# Patient Record
Sex: Male | Born: 1950 | Race: White | Hispanic: No | Marital: Single | Smoking: Never smoker
Health system: Southern US, Community
[De-identification: ages and names within clinical notes are randomized; demographics above are authoritative.]

## PROBLEM LIST (undated history)

## (undated) DIAGNOSIS — F419 Anxiety disorder, unspecified: Secondary | ICD-10-CM

## (undated) DIAGNOSIS — I10 Essential (primary) hypertension: Secondary | ICD-10-CM

## (undated) DIAGNOSIS — F32A Depression, unspecified: Secondary | ICD-10-CM

## (undated) DIAGNOSIS — E7800 Pure hypercholesterolemia, unspecified: Secondary | ICD-10-CM

## (undated) DIAGNOSIS — M869 Osteomyelitis, unspecified: Secondary | ICD-10-CM

## (undated) DIAGNOSIS — K219 Gastro-esophageal reflux disease without esophagitis: Secondary | ICD-10-CM

## (undated) DIAGNOSIS — K760 Fatty (change of) liver, not elsewhere classified: Secondary | ICD-10-CM

## (undated) DIAGNOSIS — N289 Disorder of kidney and ureter, unspecified: Secondary | ICD-10-CM

## (undated) DIAGNOSIS — R479 Unspecified speech disturbances: Secondary | ICD-10-CM

## (undated) DIAGNOSIS — R110 Nausea: Secondary | ICD-10-CM

## (undated) DIAGNOSIS — K635 Polyp of colon: Secondary | ICD-10-CM

## (undated) DIAGNOSIS — D696 Thrombocytopenia, unspecified: Secondary | ICD-10-CM

## (undated) DIAGNOSIS — E119 Type 2 diabetes mellitus without complications: Secondary | ICD-10-CM

## (undated) DIAGNOSIS — D649 Anemia, unspecified: Secondary | ICD-10-CM

## (undated) DIAGNOSIS — M109 Gout, unspecified: Secondary | ICD-10-CM

## (undated) DIAGNOSIS — M1990 Unspecified osteoarthritis, unspecified site: Secondary | ICD-10-CM

## (undated) DIAGNOSIS — Z0189 Encounter for other specified special examinations: Secondary | ICD-10-CM

## (undated) DIAGNOSIS — Z9289 Personal history of other medical treatment: Secondary | ICD-10-CM

## (undated) DIAGNOSIS — F329 Major depressive disorder, single episode, unspecified: Secondary | ICD-10-CM

## (undated) HISTORY — DX: Pure hypercholesterolemia, unspecified: E78.00

## (undated) HISTORY — DX: Encounter for other specified special examinations: Z01.89

## (undated) HISTORY — DX: Nausea: R11.0

## (undated) HISTORY — PX: CHOLECYSTECTOMY: SHX55

## (undated) HISTORY — PX: HERNIA REPAIR: SHX51

## (undated) HISTORY — DX: Gout, unspecified: M10.9

## (undated) HISTORY — PX: HIP SURGERY: SHX245

## (undated) HISTORY — DX: Personal history of other medical treatment: Z92.89

## (undated) HISTORY — PX: NASAL POLYP SURGERY: SHX186

---
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
## 2000-11-14 ENCOUNTER — Ambulatory Visit (HOSPITAL_COMMUNITY): Admission: RE | Admit: 2000-11-14 | Discharge: 2000-11-14 | Payer: Self-pay | Admitting: Internal Medicine

## 2000-11-14 ENCOUNTER — Encounter: Payer: Self-pay | Admitting: Internal Medicine

## 2003-01-20 ENCOUNTER — Emergency Department (HOSPITAL_COMMUNITY): Admission: EM | Admit: 2003-01-20 | Discharge: 2003-01-20 | Payer: Self-pay | Admitting: *Deleted

## 2003-01-20 ENCOUNTER — Ambulatory Visit (HOSPITAL_COMMUNITY): Admission: RE | Admit: 2003-01-20 | Discharge: 2003-01-20 | Payer: Self-pay | Admitting: Internal Medicine

## 2003-01-21 ENCOUNTER — Ambulatory Visit (HOSPITAL_COMMUNITY): Admission: RE | Admit: 2003-01-21 | Discharge: 2003-01-21 | Payer: Self-pay | Admitting: Internal Medicine

## 2003-12-17 ENCOUNTER — Ambulatory Visit (HOSPITAL_COMMUNITY): Admission: RE | Admit: 2003-12-17 | Discharge: 2003-12-17 | Payer: Self-pay | Admitting: Internal Medicine

## 2004-03-22 ENCOUNTER — Ambulatory Visit: Payer: Self-pay | Admitting: Internal Medicine

## 2004-03-22 ENCOUNTER — Ambulatory Visit (HOSPITAL_COMMUNITY): Admission: RE | Admit: 2004-03-22 | Discharge: 2004-03-22 | Payer: Self-pay | Admitting: Internal Medicine

## 2004-03-25 ENCOUNTER — Emergency Department (HOSPITAL_COMMUNITY): Admission: EM | Admit: 2004-03-25 | Discharge: 2004-03-25 | Payer: Self-pay | Admitting: Emergency Medicine

## 2004-05-24 ENCOUNTER — Ambulatory Visit (HOSPITAL_COMMUNITY): Admission: RE | Admit: 2004-05-24 | Discharge: 2004-05-24 | Payer: Self-pay | Admitting: Family Medicine

## 2004-05-25 ENCOUNTER — Ambulatory Visit: Payer: Self-pay | Admitting: Internal Medicine

## 2004-05-27 ENCOUNTER — Ambulatory Visit: Payer: Self-pay | Admitting: Internal Medicine

## 2004-05-27 ENCOUNTER — Ambulatory Visit (HOSPITAL_COMMUNITY): Admission: RE | Admit: 2004-05-27 | Discharge: 2004-05-27 | Payer: Self-pay | Admitting: Internal Medicine

## 2004-06-02 ENCOUNTER — Ambulatory Visit (HOSPITAL_COMMUNITY): Admission: RE | Admit: 2004-06-02 | Discharge: 2004-06-02 | Payer: Self-pay | Admitting: General Surgery

## 2004-06-15 ENCOUNTER — Ambulatory Visit (HOSPITAL_COMMUNITY): Admission: RE | Admit: 2004-06-15 | Discharge: 2004-06-15 | Payer: Self-pay | Admitting: Internal Medicine

## 2004-07-09 ENCOUNTER — Observation Stay (HOSPITAL_COMMUNITY): Admission: RE | Admit: 2004-07-09 | Discharge: 2004-07-10 | Payer: Self-pay | Admitting: Pediatrics

## 2004-09-15 ENCOUNTER — Ambulatory Visit (HOSPITAL_COMMUNITY): Admission: RE | Admit: 2004-09-15 | Discharge: 2004-09-15 | Payer: Self-pay | Admitting: Family Medicine

## 2004-11-08 ENCOUNTER — Encounter: Admission: RE | Admit: 2004-11-08 | Discharge: 2004-11-08 | Payer: Self-pay | Admitting: *Deleted

## 2004-11-18 ENCOUNTER — Ambulatory Visit: Payer: Self-pay | Admitting: Cardiology

## 2004-11-22 ENCOUNTER — Encounter: Admission: RE | Admit: 2004-11-22 | Discharge: 2004-11-22 | Payer: Self-pay | Admitting: Orthopaedic Surgery

## 2005-01-13 ENCOUNTER — Ambulatory Visit: Payer: Self-pay | Admitting: Internal Medicine

## 2005-05-03 ENCOUNTER — Ambulatory Visit: Payer: Self-pay | Admitting: Internal Medicine

## 2005-05-09 ENCOUNTER — Ambulatory Visit (HOSPITAL_COMMUNITY): Admission: RE | Admit: 2005-05-09 | Discharge: 2005-05-09 | Payer: Self-pay | Admitting: Internal Medicine

## 2005-05-18 ENCOUNTER — Ambulatory Visit (HOSPITAL_COMMUNITY): Admission: RE | Admit: 2005-05-18 | Discharge: 2005-05-18 | Payer: Self-pay | Admitting: Family Medicine

## 2005-06-02 ENCOUNTER — Ambulatory Visit: Payer: Self-pay | Admitting: Internal Medicine

## 2005-06-07 ENCOUNTER — Ambulatory Visit (HOSPITAL_COMMUNITY): Admission: RE | Admit: 2005-06-07 | Discharge: 2005-06-07 | Payer: Self-pay | Admitting: Internal Medicine

## 2005-11-01 ENCOUNTER — Ambulatory Visit: Payer: Self-pay | Admitting: Internal Medicine

## 2005-11-14 HISTORY — PX: ESOPHAGOGASTRODUODENOSCOPY: SHX1529

## 2005-11-18 ENCOUNTER — Ambulatory Visit (HOSPITAL_COMMUNITY): Admission: RE | Admit: 2005-11-18 | Discharge: 2005-11-18 | Payer: Self-pay | Admitting: Internal Medicine

## 2005-11-18 ENCOUNTER — Ambulatory Visit: Payer: Self-pay | Admitting: Internal Medicine

## 2007-01-22 ENCOUNTER — Ambulatory Visit (HOSPITAL_COMMUNITY): Admission: RE | Admit: 2007-01-22 | Discharge: 2007-01-22 | Payer: Self-pay | Admitting: General Surgery

## 2007-02-12 ENCOUNTER — Observation Stay (HOSPITAL_COMMUNITY): Admission: RE | Admit: 2007-02-12 | Discharge: 2007-02-13 | Payer: Self-pay | Admitting: General Surgery

## 2007-02-12 ENCOUNTER — Encounter (INDEPENDENT_AMBULATORY_CARE_PROVIDER_SITE_OTHER): Payer: Self-pay | Admitting: General Surgery

## 2007-04-23 ENCOUNTER — Ambulatory Visit (HOSPITAL_COMMUNITY): Admission: RE | Admit: 2007-04-23 | Discharge: 2007-04-23 | Payer: Self-pay | Admitting: Internal Medicine

## 2007-06-04 ENCOUNTER — Ambulatory Visit: Payer: Self-pay | Admitting: Gastroenterology

## 2007-06-14 ENCOUNTER — Encounter: Payer: Self-pay | Admitting: Internal Medicine

## 2007-06-14 ENCOUNTER — Ambulatory Visit: Payer: Self-pay | Admitting: Internal Medicine

## 2007-06-14 ENCOUNTER — Ambulatory Visit (HOSPITAL_COMMUNITY): Admission: RE | Admit: 2007-06-14 | Discharge: 2007-06-14 | Payer: Self-pay | Admitting: Internal Medicine

## 2007-11-23 ENCOUNTER — Ambulatory Visit (HOSPITAL_COMMUNITY): Admission: RE | Admit: 2007-11-23 | Discharge: 2007-11-23 | Payer: Self-pay | Admitting: Family Medicine

## 2007-12-14 ENCOUNTER — Ambulatory Visit (HOSPITAL_COMMUNITY): Admission: RE | Admit: 2007-12-14 | Discharge: 2007-12-14 | Payer: Self-pay | Admitting: Urology

## 2008-02-21 ENCOUNTER — Ambulatory Visit (HOSPITAL_COMMUNITY): Admission: RE | Admit: 2008-02-21 | Discharge: 2008-02-21 | Payer: Self-pay | Admitting: Family Medicine

## 2008-03-05 ENCOUNTER — Ambulatory Visit (HOSPITAL_COMMUNITY): Admission: RE | Admit: 2008-03-05 | Discharge: 2008-03-06 | Payer: Self-pay | Admitting: General Surgery

## 2008-03-05 ENCOUNTER — Encounter (INDEPENDENT_AMBULATORY_CARE_PROVIDER_SITE_OTHER): Payer: Self-pay | Admitting: General Surgery

## 2008-11-26 ENCOUNTER — Ambulatory Visit (HOSPITAL_COMMUNITY): Admission: RE | Admit: 2008-11-26 | Discharge: 2008-11-26 | Payer: Self-pay | Admitting: Internal Medicine

## 2009-11-30 DIAGNOSIS — N390 Urinary tract infection, site not specified: Secondary | ICD-10-CM | POA: Insufficient documentation

## 2009-11-30 DIAGNOSIS — N183 Chronic kidney disease, stage 3 unspecified: Secondary | ICD-10-CM | POA: Insufficient documentation

## 2009-12-10 ENCOUNTER — Ambulatory Visit: Payer: Self-pay | Admitting: Otolaryngology

## 2009-12-15 ENCOUNTER — Ambulatory Visit: Payer: Self-pay | Admitting: Internal Medicine

## 2010-01-21 ENCOUNTER — Ambulatory Visit: Payer: Self-pay | Admitting: Otolaryngology

## 2010-01-29 ENCOUNTER — Ambulatory Visit (HOSPITAL_COMMUNITY)
Admission: RE | Admit: 2010-01-29 | Discharge: 2010-01-29 | Payer: Self-pay | Source: Home / Self Care | Attending: Otolaryngology | Admitting: Otolaryngology

## 2010-02-02 ENCOUNTER — Ambulatory Visit: Payer: Self-pay | Admitting: Internal Medicine

## 2010-02-16 ENCOUNTER — Ambulatory Visit: Admit: 2010-02-16 | Payer: Self-pay | Admitting: Internal Medicine

## 2010-03-04 ENCOUNTER — Ambulatory Visit
Admission: RE | Admit: 2010-03-04 | Discharge: 2010-03-04 | Payer: Self-pay | Source: Home / Self Care | Attending: Otolaryngology | Admitting: Otolaryngology

## 2010-03-07 ENCOUNTER — Encounter: Payer: Self-pay | Admitting: Family Medicine

## 2010-03-11 LAB — BASIC METABOLIC PANEL
BUN: 21 mg/dL (ref 6–23)
Calcium: 9 mg/dL (ref 8.4–10.5)
Chloride: 102 mEq/L (ref 96–112)
GFR calc non Af Amer: 42 mL/min — ABNORMAL LOW (ref >60)
Potassium: 4 mEq/L (ref 3.5–5.1)
Sodium: 138 mEq/L (ref 135–145)

## 2010-03-11 LAB — HEMOGLOBIN AND HEMATOCRIT, BLOOD: Hemoglobin: 14.7 g/dL (ref 13.0–17.0)

## 2010-03-11 LAB — SURGICAL PCR SCREEN

## 2010-03-18 ENCOUNTER — Other Ambulatory Visit (INDEPENDENT_AMBULATORY_CARE_PROVIDER_SITE_OTHER): Payer: Self-pay | Admitting: Otolaryngology

## 2010-03-18 ENCOUNTER — Ambulatory Visit (HOSPITAL_COMMUNITY)
Admission: RE | Admit: 2010-03-18 | Discharge: 2010-03-18 | Disposition: A | Discharge status: Home / Self Care | Payer: Medicare Other | Attending: Otolaryngology | Admitting: Otolaryngology

## 2010-03-18 DIAGNOSIS — Z79899 Other long term (current) drug therapy: Secondary | ICD-10-CM | POA: Insufficient documentation

## 2010-03-18 DIAGNOSIS — J330 Polyp of nasal cavity: Secondary | ICD-10-CM

## 2010-03-18 DIAGNOSIS — J343 Hypertrophy of nasal turbinates: Secondary | ICD-10-CM

## 2010-03-18 DIAGNOSIS — I10 Essential (primary) hypertension: Secondary | ICD-10-CM | POA: Insufficient documentation

## 2010-03-18 DIAGNOSIS — J329 Chronic sinusitis, unspecified: Secondary | ICD-10-CM | POA: Insufficient documentation

## 2010-03-25 ENCOUNTER — Ambulatory Visit (INDEPENDENT_AMBULATORY_CARE_PROVIDER_SITE_OTHER): Payer: Medicare Other | Admitting: Otolaryngology

## 2010-03-25 NOTE — Op Note (Signed)
  [name], [name]              ACCOUNT NO.:  [id number]  MEDICAL RECORD NO.:  [id number]           PATIENT TYPE:  O  LOCATION:  DAYP                          FACILITY:  [facility]  PHYSICIAN:  Newman Pies, MD            DATE OF BIRTH:  1950/03/17  DATE OF PROCEDURE:  03/18/2010 DATE OF DISCHARGE:                              OPERATIVE REPORT   SURGEON:  Newman Pies, MD  PREOPERATIVE DIAGNOSES: 1. Left nasal polyp. 2. Bilateral inferior turbinate hypertrophy. 3. Chronic nasal obstruction.  POSTOPERATIVE DIAGNOSES: 1. Left nasal polyp. 2. Bilateral inferior turbinate hypertrophy. 3. Chronic nasal obstruction.  PROCEDURE PERFORMED: 1. Left endoscopic nasal polypectomy. 2. Bilateral partial inferior turbinate resection.  ANESTHESIA:  General endotracheal tube anesthesia.  COMPLICATIONS:  None.  ESTIMATED BLOOD LOSS:  Minimal.  INDICATIONS FOR PROCEDURE:  The patient is a 60 year old male with a history of chronic nasal obstruction.  On examination, he was noted to have a large left nasal polyp as well as bilateral inferior turbinate hypertrophy.  He was previously treated with oral and topical steroids. However, he continues to be symptomatic.  Based on the above findings, the decision was made for patient to undergo the above-stated procedures.  The risks, benefits, alternatives, and details of the procedure were discussed with the patient and his mother.  Questions were invited and answered.  Informed consent was obtained.  DESCRIPTION:  The patient was taken to the operating room and placed supine on the operating table.  General endotracheal tube anesthesia was administered by the anesthesiologist.  The patient was positioned and prepped and draped in a standard fashion for nasal surgery.  Pledgets soaked with Afrin were placed in both nasal cavities.  The pledgets were subsequently removed.  Endoscopic evaluation of the nasal cavity revealed bilateral inferior turbinate  hypertrophy and a larger left nasal polyp.  The nasal polyp was carefully removed under endoscopic guidance with a Blakesley forceps.  Attention was then focused on the inferior turbinates.  The inferior one half of each inferior turbinate was crossclamped with a pair of straight Kelly clamp.  The inferior one half of each inferior turbinate was then resected with a pair of cross cutting scissors.  Hemostasis was achieved with the suction electrocautery device.  That concluded procedure for the patient.  The care of the patient was turned over to the anesthesiologist.  The patient was awakened from anesthesia without difficulty.  He was extubated and transferred to the recovery room in good condition.  OPERATIVE FINDINGS: 1. Left nasal polyp. 2. Bilateral inferior turbinate hypertrophy.  SPECIMEN:  Left nasal polyp.  FOLLOWUP CARE:  The patient will be discharged home once he is awake and alert.  He will be placed on Vicodin 1-2 tablet p.o. q.4-6 hours p.r.n. pain, and amoxicillin 500 mg p.o. t.i.d. for 5 days.     Newman Pies, MD     ST/MEDQ  D:  03/18/2010  T:  03/19/2010  Job:  [id number]  Electronically Signed by Newman Pies MD on 03/25/2010 12:32:13 PM

## 2010-05-31 LAB — BASIC METABOLIC PANEL
BUN: 14 mg/dL (ref 6–23)
CO2: 29 mEq/L (ref 19–32)
Calcium: 9.2 mg/dL (ref 8.4–10.5)
GFR calc Af Amer: 56 mL/min — ABNORMAL LOW (ref >60)
GFR calc non Af Amer: 47 mL/min — ABNORMAL LOW (ref >60)
Glucose, Bld: 109 mg/dL — ABNORMAL HIGH (ref 70–99)
Potassium: 3.8 mEq/L (ref 3.5–5.1)

## 2010-05-31 LAB — DIFFERENTIAL
Basophils Relative: 1 % (ref 0–1)
Lymphocytes Relative: 25 % (ref 12–46)
Monocytes Absolute: 0.5 10*3/uL (ref 0.1–1.0)
Neutro Abs: 3.3 10*3/uL (ref 1.7–7.7)

## 2010-05-31 LAB — CBC
HCT: 44.8 % (ref 39.0–52.0)
MCHC: 34.4 g/dL (ref 30.0–36.0)
RDW: 13.4 % (ref 11.5–15.5)
WBC: 5.3 10*3/uL (ref 4.0–10.5)

## 2010-06-02 ENCOUNTER — Ambulatory Visit (HOSPITAL_COMMUNITY)
Admission: RE | Admit: 2010-06-02 | Discharge: 2010-06-02 | Disposition: A | Discharge status: Home / Self Care | Payer: Medicare Other | Source: Ambulatory Visit | Attending: Internal Medicine | Admitting: Internal Medicine

## 2010-06-02 ENCOUNTER — Other Ambulatory Visit (HOSPITAL_COMMUNITY): Payer: Self-pay | Admitting: Internal Medicine

## 2010-06-02 DIAGNOSIS — R059 Cough, unspecified: Secondary | ICD-10-CM

## 2010-06-02 DIAGNOSIS — R05 Cough: Secondary | ICD-10-CM

## 2010-06-05 ENCOUNTER — Inpatient Hospital Stay (HOSPITAL_COMMUNITY)
Admission: EM | Admit: 2010-06-05 | Discharge: 2010-06-06 | DRG: 313 | Disposition: A | Discharge status: Home / Self Care | Payer: Medicare Other | Attending: Internal Medicine | Admitting: Internal Medicine

## 2010-06-05 DIAGNOSIS — I129 Hypertensive chronic kidney disease with stage 1 through stage 4 chronic kidney disease, or unspecified chronic kidney disease: Secondary | ICD-10-CM | POA: Diagnosis present

## 2010-06-05 DIAGNOSIS — N189 Chronic kidney disease, unspecified: Secondary | ICD-10-CM | POA: Diagnosis present

## 2010-06-05 DIAGNOSIS — R0789 Other chest pain: Principal | ICD-10-CM | POA: Diagnosis present

## 2010-06-05 LAB — POCT CARDIAC MARKERS: Myoglobin, poc: 148 ng/mL (ref 12–200)

## 2010-06-05 LAB — CBC
HCT: 40.3 % (ref 39.0–52.0)
Hemoglobin: 13.6 g/dL (ref 13.0–17.0)
MCH: 29.9 pg (ref 26.0–34.0)
MCHC: 33.7 g/dL (ref 30.0–36.0)
MCV: 88.6 fL (ref 78.0–100.0)
WBC: 6.2 10*3/uL (ref 4.0–10.5)

## 2010-06-05 LAB — DIFFERENTIAL
Basophils Absolute: 0 10*3/uL (ref 0.0–0.1)
Basophils Relative: 0 % (ref 0–1)
Lymphocytes Relative: 20 % (ref 12–46)
Monocytes Absolute: 0.5 10*3/uL (ref 0.1–1.0)

## 2010-06-05 LAB — BASIC METABOLIC PANEL
CO2: 30 mEq/L (ref 19–32)
Calcium: 8.8 mg/dL (ref 8.4–10.5)
GFR calc non Af Amer: 44 mL/min — ABNORMAL LOW (ref >60)
Sodium: 137 mEq/L (ref 135–145)

## 2010-06-06 LAB — DIFFERENTIAL
Basophils Relative: 0 % (ref 0–1)
Eosinophils Absolute: 0.1 10*3/uL (ref 0.0–0.7)
Eosinophils Relative: 2 % (ref 0–5)
Lymphs Abs: 1.3 10*3/uL (ref 0.7–4.0)
Monocytes Absolute: 0.5 10*3/uL (ref 0.1–1.0)
Monocytes Relative: 9 % (ref 3–12)
Neutrophils Relative %: 65 % (ref 43–77)

## 2010-06-06 LAB — CARDIAC PANEL(CRET KIN+CKTOT+MB+TROPI)
Relative Index: 3.5 — ABNORMAL HIGH (ref 0.0–2.5)
Total CK: 104 U/L (ref 7–232)

## 2010-06-06 LAB — CBC
MCH: 30.3 pg (ref 26.0–34.0)
MCHC: 34.4 g/dL (ref 30.0–36.0)
MCV: 88.1 fL (ref 78.0–100.0)
Platelets: 142 10*3/uL — ABNORMAL LOW (ref 150–400)
RBC: 4.45 MIL/uL (ref 4.22–5.81)

## 2010-06-06 LAB — COMPREHENSIVE METABOLIC PANEL
AST: 22 U/L (ref 0–37)
Albumin: 3.4 g/dL — ABNORMAL LOW (ref 3.5–5.2)
Alkaline Phosphatase: 46 U/L (ref 39–117)
BUN: 18 mg/dL (ref 6–23)
CO2: 27 mEq/L (ref 19–32)
Chloride: 102 mEq/L (ref 96–112)
GFR calc non Af Amer: 50 mL/min — ABNORMAL LOW (ref >60)
Potassium: 3.6 mEq/L (ref 3.5–5.1)
Total Bilirubin: 1 mg/dL (ref 0.3–1.2)

## 2010-06-06 NOTE — Discharge Summary (Signed)
  [name], [name]              ACCOUNT NO.:  [id number]  MEDICAL RECORD NO.:  [id number]           PATIENT TYPE:  I  LOCATION:  A331                          FACILITY:  [facility]  PHYSICIAN:  Wilson Singer, M.D.DATE OF BIRTH:  April 14, 1950  DATE OF ADMISSION:  06/05/2010 DATE OF DISCHARGE:  04/22/2012LH                              DISCHARGE SUMMARY   FINAL DISCHARGE DIAGNOSES: 1. Atypical left-sided chest pain, myocardial ischemia or infarction     excluded. 2. Hypertension. 3. Chronic kidney disease, stable.  MEDICATIONS ON DISCHARGE:  The patient will continue with home medications: 1. Vasotec 5 mg daily. 2. Promethazine 25 mg every 4-6 hours p.r.n. 3. Xanax 0.5 mg q.i.d. p.r.n. 4. Vicodin 1 tablet every 6 hours p.r.n. 5. Meloxicam 15 mg b.i.d. for chronic headaches.  CONDITION ON DISCHARGE:  Stable.  HISTORY:  This very pleasant 60 year old man was admitted with a 2-week history of left-sided chest pain which seemed to occur on exertion and relieved by rest.  Please see initial history and physical examination done by Dr. Lilly Cove.  HOSPITAL PROGRESS:  The patient was admitted, and serial cardiac enzymes were done.  These were all negative overnight and a repeat electrocardiogram this morning is unchanged from the one yesterday with no evidence of acute ST-T wave changes.  In fact, his pain is completely relieved now spontaneously without requiring any nitroglycerin.  I was not convinced that he had cardiac pain as he was somewhat tender in the left chest, but certainly, his symptoms warranted the possibility of cardiac pain.  DISPOSITION:  I think the patient is stable to be discharged home now.  PHYSICAL EXAMINATION:  VITAL SIGNS:  Today, temperature 97.9, blood pressure 138/73, pulse 70, saturation 96% on room air. CARDIAC:  Heart sounds are present and normal without pericardial rub. CHEST:  Lung fields are clear without pleural rub.  His chest  was nontender.  INVESTIGATIONS:  Today show a sodium of 137, potassium 3.6, bicarbonate 27, BUN 18, creatinine 1.45, hemoglobin 13.5, white blood cell count 5.4, platelets 142.  I have told him to make sure he sees his primary care physician, Dr. Sherwood Gambler, within the week and I think he probably could benefit from an outpatient stress test to make sure there is no evidence of cardiac ischemia and I will leave that decision to Dr. Sherwood Gambler, his primary care physician.     Wilson Singer, M.D.     NCG/MEDQ  D:  06/06/2010  T:  06/06/2010  Job:  [id number]  cc:   Madelin Rear. Sherwood Gambler, MD Fax: [phone]  Electronically Signed by Lilly Cove M.D. on 06/06/2010 09:45:53 AM

## 2010-06-06 NOTE — H&P (Signed)
[name], [name]              ACCOUNT NO.:  [id number]  MEDICAL RECORD NO.:  [id number]           PATIENT TYPE:  I  LOCATION:  A331                          FACILITY:  [facility]  PHYSICIAN:  Wilson Singer, M.D.DATE OF BIRTH:  [birth year]  DATE OF ADMISSION:  06/05/2010 DATE OF DISCHARGE:  LH                             HISTORY & PHYSICAL   CHIEF COMPLAINT:  Left-sided chest pain.  HISTORY OF PRESENT ILLNESS:  This is a very pleasant 60 year old man who comes in with a 2-week history of left-sided chest pain which often occurs on exertion and relieved by rest.  It is dull in nature and typically lasts between 5 and 15 minutes every time he exerts himself. It is not really associated with dyspnea or sweating.  He does have a history of morning vomiting and he has had so for many years.  This has been investigated by the gastroenterologist and no major abnormality has been found.  It is felt that this vomiting on a daily basis is likely to be psychosomatic.  He came into the emergency room and so far, his work has been rather negative with normal electrocardiogram and initial cardiac enzymes.  PAST MEDICAL HISTORY:  Significant for: 1. Hypertension. 2. Chronic kidney disease, for which he sees Dr. Kristian Covey.  PAST SURGICAL HISTORY:  Significant for: 1. Nasal surgery in February 2012. 2. Right inguinal hernia repair, January 2010. 3. Largely normal colonoscopy, 2009. 4. Umbilical hernia repair, 2008. 5. Laparoscopic cholecystectomy, 2006. 6. Numerous upper GI endoscopies, largely unrevealing but history of     polyps which have been removed, all benign.  ALLERGIES:  PENICILLIN, TETANUS.  MEDICATIONS: 1. Vasotec 5 mg daily. 2. Promethazine 25 mg every 4-6 hours p.r.n. for vomiting. 3. Xanax 0.5 mg q.i.d. p.r.n. 4. Vicodin one tablet every 6 hours p.r.n. 5. Meloxicam 15 mg b.i.d. for chronic headaches.  SOCIAL HISTORY:  He has never been married and lives with his  mother. He does not smoke, does not drink alcohol.  He is on disability.  FAMILY HISTORY:  Noncontributory.  REVIEW OF SYSTEMS:  Apart from symptoms mentioned above, there are no other symptoms referable to all systems reviewed.  PHYSICAL EXAMINATION:  VITAL SIGNS:  The patient is afebrile, blood pressure 152/74, pulse 80, respiratory rate 12-14, saturation 95% on room air. GENERAL:  There is no peripheral or central cyanosis.  There is no increased work of breathing. CARDIOVASCULAR:  Heart sounds are present, normal without murmurs. CHEST:  Lung fields are clear.  There is no pericardial or pleural rub. MUSCULOSKELETAL:  There appears to be some chest wall tenderness, somewhat reproducing his left-sided chest pain. ABDOMEN:  Soft, nontender with no hepatosplenomegaly. NEUROLOGIC:  He is alert and oriented without any focal neurologic signs. SKIN:  There are no obvious skin lesions or rashes.  INVESTIGATIONS:  Electrocardiogram shows normal sinus rhythm and is within normal limits without any acute ST-T wave changes. Hemoglobin 13.6, white blood cell count 6.2, platelets 149.  Sodium 137, potassium 3.9, bicarbonate 30, glucose 106, BUN 19, creatinine 1.6, which may well be his baseline.  Initial cardiac markers are essentially negative  with troponin less than 0.05. Chest x-ray which was done just 3 days ago is without any acute abnormality.  PROBLEM LIST: 1. Left-sided chest pain, on exertion, unclear etiology. 2. Hypertension. 3. Chronic kidney disease. 4. 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  PLAN: 1. Admit to telemetry. 2. Serial cardiac enzymes and ECG. 3. We will monitor this patient.  He may need an outpatient stress     test. Further recommendations will depend on the patient's hospital progress.     Wilson Singer, M.D.     NCG/MEDQ  D:  06/05/2010  T:  06/06/2010  Job:  [id number]  cc:   Madelin Rear. Sherwood Gambler, MD Fax:  [phone]  Electronically Signed by Lilly Cove M.D. on 06/06/2010 09:45:34 AM

## 2010-06-14 ENCOUNTER — Ambulatory Visit (INDEPENDENT_AMBULATORY_CARE_PROVIDER_SITE_OTHER): Payer: Medicare Other | Admitting: Internal Medicine

## 2010-06-21 DIAGNOSIS — Z9289 Personal history of other medical treatment: Secondary | ICD-10-CM

## 2010-06-21 HISTORY — DX: Personal history of other medical treatment: Z92.89

## 2010-06-29 NOTE — H&P (Signed)
NAMECOLBEY, [name]              ACCOUNT NO.:  [id number]   MEDICAL RECORD NO.:  [id number]          PATIENT TYPE:  AMB   LOCATION:  DAY                           FACILITY:  [facility]   PHYSICIAN:  Kassie Mends, M.D.      DATE OF BIRTH:  1950-03-05   DATE OF ADMISSION:  DATE OF DISCHARGE:  [name]                              HISTORY & PHYSICAL   REFERRING PHYSICIAN:  Lia Hopping, MD   PRIMARY CARE PHYSICIAN:  Madelin Rear. Sherwood Gambler, MD.   CHIEF COMPLAINT:  Rectal bleeding.   HISTORY OF PRESENT ILLNESS:  [name] is a 60 year old male.  He  notes that on May 25, 2007, he had an episode of large volume of  rectal bleeding.  He tells me, he went to the bathroom and blood filled  the commode on at least 2 occasions that day.  One prior to going to  [facility] Room, and the second was after he returned  home.  He was evaluated there and found to have a hemoglobin of 15.5,  hematocrit 45.1 and MCV of 87.7.  He had a normal platelet count and  normal white blood cell count.  He had total bilirubin of 1.3, otherwise  normal LFTs.  He had a creatinine of 1.7 and CO2 of 30 and otherwise  normal MET-7.  He tells me, he has had no further diarrhea or abdominal  pain since that episode.  He denies any constipation.  Denies any fever  or chills.  Denies any nausea or vomiting.  His weight has remained  stable.  His appetite is good.  Denies any proctalgia or rectal  pruritus.  Last colonoscopy was by Dr. Jena Gauss on March 22, 2004, which  showed a small rectal polyp, which was hyperplastic.  Prior colonoscopy  with Dr. 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   PAST MEDICAL AND SURGICAL HISTORY:  Includes hypertension, mildly  mentally challenged, and EGD in October 2007 by Dr. Jena Gauss, which was  benign.  He has a history of Gilbert's syndrome. He is  scheduled to have  a right inguinal hernia repair in June 2009 by Dr. Malvin Johns.  He had an  abdominal ventral hernia repair about 4 to 5 months ago per his report.  He has history of heel spurs and he is status post cholecystectomy for  cholelithiasis.  He had a duodenal polyp on previous EGD, which showed  mild dysplasia.  Followup EGD demonstrated normal mucosa.  He has normal  gastric emptying study.  He has distant remote history of peptic ulcer  disease.  He has a history of left hip pinning.  He denies any NSAID or  aspirin use.   CURRENT MEDICATIONS:  1. Xanax 0.5 mg q.6 h. p.r.n.  2. Aciphex 20 mg daily.  3. Phenergan 25 mg q.4 h. p.r.n.  4. Tylenol Extra Strength 500 mg once daily p.r.n.  5. HydroDIURIL 25 mg daily.  6. Darvocet-N 100 q.4 h. p.r.n.  7. Anusol-HC  suppository 25 mg b.i.d. p.r.n.   ALLERGIES:  TETANUS INJECTION.   FAMILY HISTORY:  Positive for mother with colon cancer.  Father deceased  secondary to motor vehicle accident.  He has 3 healthy siblings.   SOCIAL HISTORY:  [name] is single.  He is unemployed.  He denies  any alcohol, tobacco, or drug use.   REVIEW OF SYSTEMS:  See HPI, otherwise negative.   PHYSICAL EXAMINATION:  VITAL SIGNS:  Weight 232 pounds, height is 80  inches, temperature 98.6, blood pressure 142/80, and pulse 84.  GENERAL:  [name] is a well-developed, well-nourished Caucasian male  in no acute distress.  He appears to answers questions appropriately.  HEENT:  Sclerae clear, nonicteric, conjunctivae pink.  Oropharynx pink  and moist without any lesions.  NECK:  Supple without any mass or thyromegaly.  HEART:  Regular rate and rhythm.  Normal S1 and S2.  No murmurs, clicks,  rubs, or gallops.  LUNGS:  Clear to auscultation bilaterally.  ABDOMEN:  Positive bowel sounds x4.  No bruits auscultated.  Soft,  nontender and nondistended without palpable mass or hepatosplenomegaly.  No rebound, tenderness, or guarding.   EXTREMITIES:  Without clubbing or edema bilaterally.  RECTAL:  No external lesions visualized.  Good sphincter tone.  No  internal masses palpated.  A small amount of medium brown stool was  obtained as well which was Hemoccult positive.   IMPRESSION:  [name] is a 60 year old male with a 1-day history of  significant hematochezia.  However, his hemoglobin was stable.  He is  also found to be Hemoccult positive on exam today.  He does have history  of adenomatous polyps as well as a family history of colon cancer.  I  suspect bleeding may be due to benign anorectal source including  internal hemorrhoids or fissure, however, given the fact of his above  history and family history, would pursue colonoscopy to rule out  colorectal neoplasia given the fact that his colonoscopy has been over 3  years ago.   PLAN:  Colonoscopy by Dr. Jena Gauss in the near future.  Discussed the  procedure including risk and benefits including but not limited to  bleeding, infection, perforation, and drug reaction.  He agrees with the  plan.  Consent will be obtained.   Dr. Cira Servant to sign in Dr. Luvenia Starch absence.      Lorenza Burton, N.P.      Kassie Mends, M.D.  Electronically Signed   KJ/MEDQ  D:  06/05/2007  T:  06/05/2007  Job:  [id number]   cc:   Madelin Rear. Sherwood Gambler, MD  Fax: [phone]

## 2010-06-29 NOTE — Op Note (Signed)
NAMEEMIN, FOREE              ACCOUNT NO.:  [id number]   MEDICAL RECORD NO.:  [id number]          PATIENT TYPE:  AMB   LOCATION:  DAY                           FACILITY:  [facility]   PHYSICIAN:  R. Roetta Sessions, M.D. DATE OF BIRTH:  [birth year]   DATE OF PROCEDURE:  06/14/2007  DATE OF DISCHARGE:                               OPERATIVE REPORT   INDICATIONS FOR PROCEDURE:  A 60 year old gentleman with intermittent  hematochezia.  His hemoglobin remains stable in normal range.  He has a  history of high-grade colonic adenomas previously.  His last colonoscopy  was in 2006.  Colonoscopy is now being done to further evaluate his  symptoms, which has been discussed with the patient at length.  Potential risks, benefits, alternatives and have been reviewed,  questions answered.  Please see documentation in medical record.   PROCEDURE NOTE:  O2 saturation, blood pressure, pulse, and respirations  were monitored throughout the entire procedure.  Conscious sedation,  Versed 6 mg IV, Demerol 100 mg IV in divided doses.   INSTRUMENT:  Pentax video chip system.   FINDINGS:  Digital rectal exam revealed no abnormalities.  The scope was  placed.  Prep was adequate.  Colon:  Colonic mucosa was surveyed from  the rectosigmoid junction through the left transverse, right colon,  appendiceal orifice, ileocecal valve, and cecum.  These structures were  well seen and photographed for the record.  From this level, scope was  slowly  and cautiously withdrawn.  All previously mentioned mucosal  surfaces were again seen.  The patient had a solitary cecal  diverticulum.  He had a 6-mm polyp at the hepatic flexure, which was  cold snared, and two 5-mm polyps in the mid descending colon, which were  also cold snared.  The remainder of the colonic mucosa appeared normal.  The scope was pulled down into the rectum where thorough examination of  the rectal mucosa, including retroflexed view of the anal verge.   En  face view of the anal canal demonstrated friable (bleeding) hemorrhoids.  The patient tolerated the procedure well and was reactive in Endoscopy.   IMPRESSION:  1. Friable anal canal hemorrhoids, otherwise normal rectum.  2. Mid descending colon polyps, removed as described above.  3. Hepatic flexure polyp, removed as described above from solitary      cecal diverticulum.  The remainder of colonic mucosa appeared      normal.   I suspect the patient has experienced bleed from hemorrhoids.   RECOMMENDATIONS:  1. Daily Metamucil or Citrucel fiber supplement, 10-day course; Anusol      HC suppositories one per rectum at      bedtime.  2. Follow up on path.  3. Further recommendations to follow.      Jonathon Bellows, M.D.  Electronically Signed     RMR/MEDQ  D:  06/14/2007  T:  06/15/2007  Job:  [id number]   cc:   Lia Hopping  Fax: [phone]   Madelin Rear. Sherwood Gambler, MD  Fax: [phone]

## 2010-06-29 NOTE — Op Note (Signed)
NAMECASPAR, FAVILA              ACCOUNT NO.:  [id number]   MEDICAL RECORD NO.:  [id number]          PATIENT TYPE:  OIB   LOCATION:  A312                          FACILITY:  [facility]   PHYSICIAN:  Barbaraann Barthel, M.D. DATE OF BIRTH:  Jan 04, 1951   DATE OF PROCEDURE:  DATE OF DISCHARGE:                               OPERATIVE REPORT   SURGEON:  Barbaraann Barthel, MD   PREOPERATIVE DIAGNOSIS:  Right inguinal hernia.   POSTOPERATIVE DIAGNOSIS:  Right inguinal hernia.   PROCEDURE:  Right inguinal herniorrhaphy.  (Modified McVay repair.  No  mesh).   SPECIMEN:  Right inguinal hernia sac.   WOUND CLASSIFICATION:  Clean.   NOTE:  This is a 60 year old white male who was found to have right  inguinal hernia.  We planned for elective repair.  At that time, he had  a lower respiratory infection and coughing, so we postponed the repair  and he was admitted for via the Outpatient Department for elective  repair; at which time, he no longer had his cough.  We discussed with  his mother and with him the repair.  We discussed complications not  limited to, but including bleeding, infection, and recurrence.  Informed  consent was obtained.   GROSS OPERATIVE FINDINGS:  Those consistent with an indirect and a  direct hernia.   TECHNIQUE:  The patient was placed in supine position.  After the  adequate administration of spinal anesthesia and the placement of Foley  catheter aseptically, his abdomen was prepped with Betadine solution and  draped in usual manner.  Incision was carried out between the anterior-  superior iliac spine and the pubic tubercle through skin, subcutaneous  tissue through Scarpa, down through the external oblique which was  opened through the external ring.  The cord structures were very scarred  down to the hernia sac.  We dissected these free and then ligated the  sac under direct vision with 2-0 Bralon suture.  We then sutured  transversus abdominis and transversalis  fascia to Poupart ligament and  Cooper ligament with interrupted 2-0 Bralon sutures.  We performed a  relaxing incision prior to cinching these tightly.  We then returned the  cord structures to the anatomical position and closed the external  oblique over them with a running 0 Polysorb suture.  The subcu was  irrigated with normal saline solution.  I used approximately 16 mL of  0.5% Sensorcaine to help with postoperative comfort, then closed the  skin with a stapling  device.  Prior to closure, all sponge and needle instrument counts found  to be correct.  Estimated blood loss was minimal.  The patient received  a liter of crystalloids intraoperatively.  No drains were placed.  There  were no complications.      Barbaraann Barthel, M.D.  Electronically Signed     WB/MEDQ  D:  03/05/2008  T:  03/05/2008  Job:  [id number]

## 2010-06-29 NOTE — Op Note (Signed)
NAMERICKI, [name]              ACCOUNT NO.:  [id number]   MEDICAL RECORD NO.:  [id number]          PATIENT TYPE:  AMB   LOCATION:  DAY                           FACILITY:  [facility]   PHYSICIAN:  Barbaraann Barthel, M.D. DATE OF BIRTH:  [birth year]   DATE OF PROCEDURE:  02/12/2007  DATE OF DISCHARGE:                               OPERATIVE REPORT   PREOPERATIVE DIAGNOSIS:  Umbilical hernia.   POSTOPERATIVE DIAGNOSIS:  Umbilical hernia.   PROCEDURE:  Umbilical herniorrhaphy (no mesh used).   SPECIMENS:  Umbilical hernia sac.   INDICATIONS:  This is a 60 year old white male who had an umbilical  hernia.  Possibly this was stemming from a previous laparoscopic  cholecystectomy.  This was a fairly small defect and did not require any  mesh for repair.   TECHNIQUE:  The patient was placed in the supine position.  After the  adequate administration of general anesthesia via endotracheal  intubation his entire abdomen was prepped with Betadine solution and  draped in the usual manner.  An umbilical incision was carried out over  the superior aspect of the umbilicus, where there had been a scar from  his umbilical hernia trocar.  This was extended, and we were able to see  quite clearly a fascial rim 360 degrees around the defect.  This was  dissected free.  We then closed the defect using #1 Prolene in figure-of-  eight fashion, closing this transversely.  I then used a running 0  Prolene suture to close this in a sort of pants-over-vest type of two-  layer repair.  The wound was irrigated with normal saline solution.  I  used approximately 20 cubic centimeters of 0.5% Sensorcaine to help with  postoperative comfort.  I sutured the umbilicus and tacked this down to  the fascia in order to return the umbilicus to its natural concave  appearance.  I then stapled the incision with surgical staples.  A  sterile dressing with Neosporin ointment was applied.  Prior to closure  all sponge,  needle, and instrument counts were found to be correct.  The  estimated blood loss was minimal.  The patient received 1100 ml of  crystalloid intraoperatively.  No drains were placed, and there were no  complications.      Barbaraann Barthel, M.D.  Electronically Signed     WB/MEDQ  D:  02/12/2007  T:  02/12/2007  Job:  [id number]

## 2010-07-02 NOTE — Consult Note (Signed)
NAME:  [name], [name] NO.:  [id number]   MEDICAL RECORD NO.:  [id number]                  PATIENT TYPE:   LOCATION:                                       FACILITY:   PHYSICIAN:  Lionel December, M.D.                 DATE OF BIRTH:  Sep 02, 1950   DATE OF CONSULTATION:  12/26/2002  DATE OF DISCHARGE:                                   CONSULTATION   PRESENTING COMPLAINT:  Rectal pain, family history of colon carcinoma.   HISTORY OF PRESENT ILLNESS:  [name] is a 60 year old Caucasian male who was  referred through the courtesy of Dr. Sherwood Gambler for GI evaluation, particularly  colonoscopy.  The patient has mild mental retardation.  He is accompanied by  his mother who provided most of the history.  The patient was in usual state  of health until about two weeks ago when he began to experience rectal pain  which was more or less sudden in onset.  He felt as if bone was coming out  of his rectum.  He was seen by Dr. Sherwood Gambler last week.  He was begun on therapy  and this appointment was made.  The patient says he is feeling better.  Now  he experiences pain only when he sits on a hard surface.  He denies history  of fall, constipation, diarrhea, melena or rectal bleeding.  His mother is  very concerned about his symptoms and wants him to have a colonoscopy  because she has had surgery for colorectal carcinoma.  The patient has good  appetite.  He denies abdominal pain, heartburn, nausea, vomiting, fever,  chills or recent weight loss.   MEDICATIONS:  1. Vicodin 5/500 q.i.d. p.r.n. which he does not take daily.  2. Keflex 500 mg q.i.d. and he has a few more doses left.   PAST MEDICAL HISTORY:  1. Remote history of peptic ulcer disease complicated by bleeding.  2. H.pylori status is unknown.  3. Mental retardation.   PAST SURGICAL HISTORY:  1. Surgery on both heels for spurs.  2. Left hip surgery.  3. All his teeth extracted.   ALLERGIES:  TETANUS TOXOID.   FAMILY HISTORY:  Father died of auto accident.  Mother had surgery for colon  carcinoma her 2's and now is in her 65's and is doing well.  She has  hypertension.  [name] has one sister and two brothers who are in good health.   SOCIAL HISTORY:  He is single.  He went to Special Ed classes, and he is  able to read some.  He has very limited writing ability.  He does not smoke  cigarettes or drink alcohol.   PHYSICAL EXAMINATION:   GENERAL APPEARANCE:  Well-developed, well-nourished Caucasian male who is in  no acute distress.  He has slight speech impairment and dysarthria.  VITAL SIGNS:  He weighs 234 pounds, he is 6 feet 8 inches  tall.  Pulse 32,  blood pressure 140/72, temperature 98.  HEENT:  Conjunctivae is pink.  Sclerae is nonicteric.  Oropharyngeal mucosa  is normal.  He is edentulous.  NECK:  Without masses or thyromegaly.  CARDIAC:  Regular rhythm.  Normal S1, S2.  No murmur or gallop noted.  LUNGS:  Clear to auscultation.  ABDOMEN:  Protuberant, but soft and nontender without organomegaly or  masses.  RECTAL:  Limited to external inspection and palpation.  There was no mass,  tenderness.  He is tender over his coccyx, but there is no bone displacement  on palpation.  EXTREMITIES:  She does not have peripheral edema or clubbing.   ASSESSMENT:  [name] is a 60 year old Caucasian male with mental retardation who  has been experiencing rectal/coccygeal pain two weeks duration.  I suspect  this is related to perhaps a fall or trauma.  I doubt that this is  gastrointestinal pain.   I agree he should undergo colonoscopy for screening purposes given his  mother has had surgery for colon carcinoma.   RECOMMENDATIONS:  Total colonoscopy to be performed at [facility]  in the near future.  I reviewed the procedure with the patient and his  mother, and they are both agreeable.   We will also check H.pylori serology at that time, given a remote history of  peptic ulcer disease  with GI bleeding.   I would like to thank Dr. Sherwood Gambler for the opportunity to participate in the  care of this gentleman.      ___________________________________________                                            Lionel December, M.D.   NR/MEDQ  D:  12/26/2002  T:  12/27/2002  Job:  [id number]   cc:   Madelin Rear. Sherwood Gambler, M.D.  [address]  [place]  [place] [address]  Fax: [phone]

## 2010-07-02 NOTE — Op Note (Signed)
[name], [name]              ACCOUNT NO.:  [id number]   MEDICAL RECORD NO.:  [id number]          PATIENT TYPE:  AMB   LOCATION:  DAY                           FACILITY:  [facility]   PHYSICIAN:  Barbaraann Barthel, M.D. DATE OF BIRTH:  1950-08-14   DATE OF PROCEDURE:  07/09/2004  DATE OF DISCHARGE:                                 OPERATIVE REPORT   SURGEON:  Dr. Malvin Johns.   PREOPERATIVE DIAGNOSIS:  Cholecystitis, cholelithiasis.   POSTOPERATIVE DIAGNOSES:  Cholecystitis, cholelithiasis   PROCEDURE:  Laparoscopic cholecystectomy.   SPECIMEN:  Gallbladder and stones.   NOTE:  This is a 60 year old white male who had recurrent episodes of right  upper quadrant pain, nausea and vomiting for least two years. Workup had  showed him to have cholelithiasis. Liver function studies preoperatively  were grossly within normal limits. His amylase was not elevated. We  discussed laparoscopic cholecystectomy with him and his mother in detail. We  discussed complications not limited to but including bleeding, infection,  damage to bile duct, perforation of organs and transitory diarrhea. We also  discussed the possibility that possible open procedure may be needed.  Informed consent was obtained.   GROSS OPERATIVE FINDINGS:  The patient had some fatty adhesions around the  gallbladder. The gallbladder had some slight edema. No other abnormalities  were encountered. There were possibly some small stones in the gallbladder;  I did not feel prominent ones within it. The cystic duct appeared normal  caliber.   TECHNIQUE:  The patient was placed in supine position after the adequate  administration of general anesthesia via endotracheal intubation. His entire  abdomen was prepped with Betadine solution and draped in the usual manner. A  periumbilical incision was carried out over the superior aspect of the  umbilicus with the patient Trendelenburg. The fascia was grasped with a  sharp towel clip  and elevated. The Veress needle was inserted and confirmed  in position with a saline drop test. We then placed an 11-mm cannula in the  umbilicus incision using the Visiport technique, and then under direct  vision, three other cannulas were placed; an 11-mm cannula in the  epigastrium and two 5-mm cannulas in the right upper quadrant laterally. The  gallbladder was grasped. Its adhesions were taken down. The cystic duct was  clearly visualized, triply silver clipped on the side of the side of the  common bile duct, and singly silver clipped on the side of the gallbladder.  The  cystic artery was as well. We then removed the gallbladder using the  hook cautery device from the liver bed. This was uneventful. We removed this  with the EndoCatch device through the epigastric incision. We then irrigated  with normal saline solution. I elected to leave Surgicel in the liver bed  and placed a Jackson-Pratt drain which was exited through a lateral 5-mm  cannula site. After checking for hemostasis, we then desufflated the abdomen  and repaired the incisions using a 0 Polysorb in the area of the 11-mm  cannula sites in the epigastrium and the umbilicus and then closed all  incisions with  stapling device. The drain was  sutured in place with 3-0 nylon, and sterile dressings were applied. Prior  to closure all sponge, needle, and instrument counts were found to be  correct. We did use approximately 10 cc of 1/2% Sensorcaine around the port  sites for postoperative comfort. There were no complications.       WB/MEDQ  D:  07/09/2004  T:  07/09/2004  Job:  [id number]   cc:   Madelin Rear. Sherwood Gambler, MD  [address]  [place]  [place] [address]  Fax: [phone]   R. Roetta Sessions, M.D.  [address]  [place] [address]

## 2010-07-02 NOTE — Op Note (Signed)
NAMEJAXN, [name]              ACCOUNT NO.:  [id number]   MEDICAL RECORD NO.:  [id number]          PATIENT TYPE:  AMB   LOCATION:  DAY                           FACILITY:  [facility]   PHYSICIAN:  R. Roetta Sessions, M.D. DATE OF BIRTH:  Apr 25, 1950   DATE OF PROCEDURE:  05/27/2004  DATE OF DISCHARGE:                                 OPERATIVE REPORT   PROCEDURE:  Esophagogastroduodenoscopy with snare polypectomy.   INDICATIONS FOR PROCEDURE:  Patient is a 60 year old gentleman with  intermittent post-prandial epigastric right upper quadrant abdominal pain,  nausea, vomiting, recent CT demonstrating cholelithiasis.  LFTs have been  normal.  He has a fatty-appearing liver on CT.  It is suspected that his  upper abdomen symptoms are related to gallbladder disease; however, he is  with a distant history of peptic ulcer disease.  We will go ahead and do an  EGD prior to surgical referral.  This approach has been discussed with the  patient at length.  Potential risks, benefits and alternatives have been  reviewed.  Questions reviewed.  Please see my office note of May 25, 2004  for more information.   PROCEDURE NOTE:  O2 saturation, blood pressure, pulses, and respirations  were monitored throughout the entire procedure.  Conscious sedation with IV  Versed and Demerol in incremental doses.   INSTRUMENT:  Olympus video chip system.   FINDINGS:  Examination of the tubular esophagus revealed no mucosal  abnormalities.  The EG junction was easily traversed.  The stomach and  gastric cavity were empty and insufflated well with air.  A thorough  examination of the gastric mucosa included a retroflexed view of the  proximal stomach.  The gastroesophageal junction demonstrated a small hiatal  hernia.  The pylorus was patent and easily traversed.  Examination of the  bulb and second portion revealed a 1 cm pedunculated polyp on a broad stalk  at the junction between D1 and D2.  Please see photos.   The remainder of the  duodenum...   DICTATION ENDED HERE....      RMR/MEDQ  D:  05/27/2004  T:  05/27/2004  Job:  [id number]

## 2010-07-02 NOTE — Op Note (Signed)
NAMEEPIMENIO, SCHETTER              ACCOUNT NO.:  [id number]   MEDICAL RECORD NO.:  [id number]          PATIENT TYPE:  AMB   LOCATION:  DAY                           FACILITY:  [facility]   PHYSICIAN:  R. Roetta Sessions, M.D. DATE OF BIRTH:  06-20-1950   DATE OF PROCEDURE:  11/18/2005  DATE OF DISCHARGE:                                 OPERATIVE REPORT   PROCEDURE:  Diagnostic esophagogastroduodenoscopy.   INDICATIONS FOR PROCEDURE:  A 60 year old gentleman with longstanding  intermittent nausea and vomiting, extensively evaluated over time.  EGD is  now being done.  If this is unrevealing, as previously discussed, Mr.  Study and his mother will make plans to seek out tertiary referral by Dr.  Alycia Rossetti at [facility].  All questions were answered, and  all parties agreeable.   PROCEDURE NOTE:  O2 saturation, blood pressure, pulses, and respirations  were monitored throughout the entire procedure.  Conscious sedation with  Versed 5 mg IV and Demerol 100 mg IV in divided doses.  Phenergan 25 mg  diluted, slow IV push, Cetacaine spray for topical oropharyngeal anesthesia.   FINDINGS:  Examination of the tubular esophagus revealed a widely patent  esophagus.  Mucosa appeared normal.  EG junction was easy to traverse.   STOMACH:  Gastric cavity was empty and insufflated well with air.  A  thorough examination of the gastric mucosa included a retroflexed view of  the proximal stomach, esophagogastric junction demonstrated no  abnormalities.  The pylorus was patent and easily traversed.  Examination of  the bulb, second portion, revealed no abnormalities.   THERAPEUTICS/DIAGNOSTIC MANEUVERS PERFORMED:  None.   Patient tolerated the procedure well and was reactive.   ENDOSCOPY IMPRESSION:  Normal esophagus, stomach, D1, D2.   RECOMMENDATIONS:  Will arrange tertiary referral to [facility] for second opinion in the near future.      Jonathon Bellows, M.D.  Electronically Signed     RMR/MEDQ  D:  11/18/2005  T:  11/19/2005  Job:  [id number]   cc:   Madelin Rear. Sherwood Gambler, MD  Fax: [phone]

## 2010-07-02 NOTE — Op Note (Signed)
NAMEKALMAN, NYLEN              ACCOUNT NO.:  [id number]   MEDICAL RECORD NO.:  [id number]          PATIENT TYPE:  AMB   LOCATION:  DAY                           FACILITY:  [facility]   PHYSICIAN:  R. Roetta Sessions, M.D. DATE OF BIRTH:  [birth year]   DATE OF PROCEDURE:  05/27/2004  DATE OF DISCHARGE:  05/27/2004                                 OPERATIVE REPORT   PROCEDURE:  Esophagogastroduodenoscopy with snare polypectomy.   INDICATIONS FOR PROCEDURE:  A 60 year old gentleman with intermittent  postprandial epigastric right upper quadrant abdominal pain, nausea,  vomiting. Recent CT demonstrated cholelithiasis. LFTs have been normal. He  has a fatty appearing liver on CT. Suspect that his upper abdominal symptoms  are related to gallbladder disease; however, he has a distant history of  peptic ulcer disease. Will go ahead and do an EGD prior to surgical  referral. This approach has been discussed with the patient at length.  Potential risks, benefits, and alternatives have been reviewed and questions  answered. Please see my office H&P April 11 for more information.   PROCEDURE NOTE:  O2 saturation, blood pressure, pulse, and respirations were  monitored throughout the entire procedure.  Conscious sedation with IV  Versed and Demerol in incremental doses.   INSTRUMENT:  Olympus video chip system.   FINDINGS:  Examination of the tubular esophagus revealed no mucosa  abnormalities. EG junction was easily traversed.   Stomach. Gastric cavity was empty and insufflated well with air. Thorough  examination of the gastric mucosa including retroflexed view of the proximal  stomach was undertaken. The GE junction demonstrated a small hiatal hernia.  Pylorus was patent and easily traversed. Examination of the bulb and second  portion revealed a 1-cm pedunculated polyp on a broad stalk at the junction  between D1 and D2. The remainder of the duodenum appeared normal.   THERAPEUTIC/DIAGNOSTIC  MANEUVERS:  A polyp in the junction between D1 and D2  was resected with snare cautery and recovered through the scope. It was felt  to have been resected completely. The patient tolerated the procedure well  and was reactive to endoscopy.   IMPRESSION:  Normal esophagus. Small hiatal hernia. Otherwise normal  stomach. A 1-cm pedunculated polyp on a stalk in the junction between D1 and  D2 status post snare polypectomy. Otherwise D1 and D2 appeared normal.   RECOMMENDATIONS:  1.  No aspirin or arthritis medications for the next 10 days.  2.  Followup on pathology.  3.  Further recommendations to follow.      RMR/MEDQ  D:  06/08/2004  T:  06/08/2004  Job:  [id number]   cc:   Madelin Rear. Sherwood Gambler, MD  [address]  [place]  [place] [address]  Fax: [phone]

## 2010-07-02 NOTE — Op Note (Signed)
[name], [name]              ACCOUNT NO.:  [id number]   MEDICAL RECORD NO.:  [id number]          PATIENT TYPE:  AMB   LOCATION:  DAY                           FACILITY:  [facility]   PHYSICIAN:  R. Roetta Sessions, M.D. DATE OF BIRTH:  [birth year]   DATE OF PROCEDURE:  06/15/2004  DATE OF DISCHARGE:                                 OPERATIVE REPORT   PROCEDURE:  Esophagogastroduodenoscopy with biopsy.   INDICATIONS FOR PROCEDURE:  The patient is a 60 year old gentleman with  colicky right upper quadrant abdominal pain recently. Has cholelithiasis on  ultrasound. He is slated to see Dr. Malvin Johns for cholecystectomy. He  underwent EGD back on June 08, 2004 just to make sure he did not have a  luminal pathology in his upper GI tract to account for his symptoms.   He was actually found to have a 1-cm pedunculated polyp on a stalk at the  junction between D1 and D2. It was removed with snare cautery. This lesion  was felt to have some dysplastic changes, not high grade dysplasia, however,  some dysplasia, some appearance of a hamartoma. It was felt to have been  resected completely; however, it was felt best to go back and look at the  site and make sure that there was no evidence of any residual tissue. This  approach has been discussed with patient at length. Potential risks,  benefits, and alternatives have been reviewed and questions answered. Please  see documentation in the medical record.   PROCEDURE NOTE:  O2 saturation, blood pressure, pulse, and respirations were  monitored throughout the entire procedure. Conscious sedation with Versed 3  mg IV and Demerol 75 mg IV in divided doses.   INSTRUMENT:  Olympus video chip system.   FINDINGS:  Examination of the tubular esophagus revealed no mucosal  abnormalities.   Stomach:  Gastric cavity was empty and insufflated well with air. Thorough  examination of gastric mucosa including retroflexed view of the proximal  stomach and  esophagogastric junction demonstrated only small hiatal hernia.  Pylorus was patent and easily traversed. Examination of bulb and second  portion revealed area of scarring in the posterior bulb consistent with site  of prior polypectomy. Please see photos.   I elected to biopsy the polypectomy site just to make sure there was no  evidence of persistent dysplasia. The patient tolerated the procedure well  and was reactive to endoscopy.   IMPRESSION:  Normal esophagus. Small hiatal hernia. Otherwise normal  stomach. Area of scar in the posterior bulb. Otherwise normal D1 and D2.  Scar base biopsied.   RECOMMENDATIONS:  I suspect the biopsies will come back okay and will simple  proceed with cholecystectomy but will follow up on pathology and make that  recommendation in the near future.      RMR/MEDQ  D:  06/15/2004  T:  06/15/2004  Job:  [id number]

## 2010-07-02 NOTE — Op Note (Signed)
NAMESASUKE, YAFFE              ACCOUNT NO.:  [id number]   MEDICAL RECORD NO.:  [id number]          PATIENT TYPE:  AMB   LOCATION:  DAY                           FACILITY:  [facility]   PHYSICIAN:  R. Roetta Sessions, M.D. DATE OF BIRTH:  1951/01/14   DATE OF PROCEDURE:  03/22/2004  DATE OF DISCHARGE:                                 OPERATIVE REPORT   PROCEDURE:  Colonoscopy with snare polypectomy, biopsy.   ENDOSCOPIST:  Gerrit Friends. Rourk, M.D.   INDICATIONS FOR PROCEDURE:  The patient is a 60 year old gentleman who comes  for colorectal cancer screening.  He says he has had a colonoscopy several  years ago, and may have had a polyp removed.  Mother's history is  significant for having a large villous adenoma possible carcinoma, for which  she underwent a right hemicolectomy in [place], several years ago.  Mr. Sookram  is devoid of any lower GI tract symptoms.  Colonoscopy is now being done.  This approach has been discussed with the patient at length.  The potential  risks, benefits, and alternatives have been reviewed; questions answered.  He is agreeable.  Please see the documentation in the medical record.   PROCEDURE NOTE:  O2 saturation, blood pressure, pulse and respirations were  monitored throughout the entire procedure.  Conscious sedation: Versed 3 mg IV, Demerol 50 mg IV in divided doses.   INSTRUMENT:  Olympus video chip adult colonoscope.   FINDINGS:  Rectal exam revealed no abnormalities.   ENDOSCOPIC FINDINGS:  The prep was adequate.   RECTUM:  Examination of the rectal mucosa including a retroflex view of the  anal verge revealed a 5-mm sessile polyp at 3 mm in from the anal verge;  otherwise rectal mucosa appeared to be normal.   COLON:  The colonic mucosa was surveyed from the rectosigmoid junction  through the left transverse and right colon to the area of the appendiceal  orifice, ileocecal valve, and cecum.  These structures were well seen and  photographed for  the record.  From this level the scope was slowly  withdrawn; and all previously mentioned mucosal surfaces were again seen.  The colonic mucosa appeared normal.  The polyp in the rectum, which is seen  best retroflexed, was cold biopsied; a good part of it remained.  I elected  to pass a snare through the scope and resected the small residual polyp  tissue with cold snare technique.  This was done without difficulty.  Polyp  was submitted to the pathologist.  The patient tolerated the procedure well  and was reacted in endoscopy.   IMPRESSION:  1.  Rectal polyp, as described above, status post cold biopsied/ snare      removal.  2.  The remainder of the rectal mucosa appeared normal.  3.  The colonic mucosa appeared normal.   RECOMMENDATIONS:  Follow up on path.  Further recommendations to follow.      RMR/MEDQ  D:  03/22/2004  T:  03/22/2004  Job:  [id number]

## 2010-07-02 NOTE — H&P (Signed)
NAME:  [name], [name]                        ACCOUNT NO.:  [id number]   MEDICAL RECORD NO.:  [id number]                   PATIENT TYPE:  EMS   LOCATION:  ED                                   FACILITY:  [facility]   PHYSICIAN:  Lionel December, M.D.                 DATE OF BIRTH:  Oct 12, 1950   DATE OF ADMISSION:  01/20/2003  DATE OF DISCHARGE:                                HISTORY & PHYSICAL   EMERGENCY ROOM NOTE:   PRESENTING COMPLAINT:  Hematochezia.  The patient is status post polypectomy  this morning.   HISTORY OF PRESENT ILLNESS:  [name] is a 60 year old Caucasian male with mild  mental retardation who underwent a colonoscopy primarily for screening  purposes because of a family history of colon carcinoma.  He was found to  have two polyps.  A small polyp was ablated by cold biopsy from his right  colon.  He had at least a 2-cm polyp on a short stalk in the area of the  splenic flexure.  This was snared.  Polypectomy site was clean.  The  patient's mother called me this evening stating that he was passing bright  red blood per rectum.  He had some discomfort in the left upper quadrant of  his abdomen.  He did not have nausea, vomiting, or postural symptoms.  He  actually had his lunch and then evening meal.  I, therefore, be asked that  he be brought to the emergency room __________ .   The patient has not seen any blood in the last 1-1/2 hours.  His mother now  tells me that she forget to mention that he had some bleeding yesterday when  he took the prep and also a week ago.  He has some discomfort in the left  upper quadrant but otherwise feels fine.  His CBC, PT, PTT, and MET-7 have  been drawn, but the results are pending.   For the details of past, family, and social history please see my dictated  note from two weeks ago.   OBJECTIVE:  [name] appears to be quite comfortable.  His pulse is 96 per  minute, blood pressure 131/66, respirations 16, and temperature is 98.  Conjunctivae and nail beds are pink.  His abdomen has full bowel sounds and  normal.  Palpation reveals soft abdomen without tenderness, organomegaly, or  masses.  Rectal examination reveals no external abnormality.  There is no  blood on digital exam.  There was a scant amount of yellowish stool which is  heme-positive.   ASSESSMENT:  I suspect [name]'s hematochezia is secondary to hemorrhoids.  If  this was a polyp active bleed I would expect he wound have a lot of blood on  digital exam.  According to the patient, all he had was blood on the tissue  and no frank bleeding as was communicated to me over the telephone.   PLAN:  The  patient and his mother were reassured.  As soon as his laboratory  studies are available for review he could go home.  Once again I will  reinforce that he should not take any aspirin for 10 days.  He should take  Metamucil or Citrucel 1 tablespoonful daily and Colace 1-2 at bedtime to  keep his stools bulky and soft.    ___________________________________________                                         Lionel December, M.D.   NR/MEDQ  D:  01/20/2003  T:  01/20/2003  Job:  [id number]

## 2010-07-02 NOTE — Op Note (Signed)
NAME:  [name], [name]                        ACCOUNT NO.:  [id number]   MEDICAL RECORD NO.:  [id number]                   PATIENT TYPE:  AMB   LOCATION:  DAY                                  FACILITY:  [facility]   PHYSICIAN:  Lionel December, M.D.                 DATE OF BIRTH:  1950-04-11   DATE OF PROCEDURE:  01/20/2003  DATE OF DISCHARGE:                                 OPERATIVE REPORT   PROCEDURE:  Total colonoscopy with polypectomy.   ENDOSCOPIST:  Lionel December, M.D.   INDICATIONS:  [name] is a 60 year old Caucasian male who is undergoing  colonoscopy primarily for screening purposes.  The family history is  positive for colon carcinoma in his mother.  He recently presented with  anorectal pain which has improved with symptomatic therapy.  The procedure  and risks were reviewed with the patient and informed consent was obtained.   PREOPERATIVE MEDICATIONS:  Demerol 25 mg IV, Versed 4 mg IV, Atropine 0.25  mg IV.   FINDINGS:  Procedure performed in endoscopy suite.  The patient's vital  signs and O2 saturation were monitored during the procedure.  He had a drop  in his blood pressure and became diaphoretic without bradycardia.  He was  given IV Atropine and placed in anti-Trendelenburg position for a few  minutes and given IV fluids and his pressure came up quickly.  This occurred  during the procedure.  I was able to, however, do a complete exam.  Rectal  exam performed no abnormality noted on external or digital exam.   Olympus videoscope was placed in the rectum and advanced under vision into  the sigmoid colon and beyond.  Preparation was not satisfactory, but I was  able to pass the scope into the cecum and cecal landmarks were well seen and  photographed for the record.  There were 2 polyps, one at the hepatic  flexure which was ablated by cold biopsy.  It was 5-6 mm.  There was another  polyp about 2 cm in diameter on a short stalk at the splenic flexure area  which was  snared and retrieved for histologic examination.  The mucosa and  the rest of the colon was normal.  The rectal mucosa similarly was normal.  He has small hemorrhoids below the dentate line.   The endoscope was straightened and withdrawn.  The patient tolerated the  procedure well.   FINAL DIAGNOSES:  1. Examination performed to the cecum.  2. Large polyp snared from the splenic flexure and a smaller polyp ablated     by cold biopsy from the hepatic flexure.  3. Preparation was suboptimal.  4. Small external hemorrhoids.   RECOMMENDATIONS:  1. No aspirin for 10 days. Will check his H. pylori serology as planned.  2. I will be contacting the patient with the biopsy results; and given the     quality of his prep I would suggest  that he should return for another     exam in 1 year from now.      ___________________________________________                                            Lionel December, M.D.   NR/MEDQ  D:  01/20/2003  T:  01/21/2003  Job:  [id number]   cc:   Madelin Rear. Sherwood Gambler, M.D.  [address]  [place]  [place] [address]  Fax: [phone]

## 2010-07-02 NOTE — H&P (Signed)
NAMEANYELO, MCCUE              ACCOUNT NO.:  [id number]   MEDICAL RECORD NO.:  [id number]          PATIENT TYPE:  AMB   LOCATION:                                FACILITY:  [facility]   PHYSICIAN:  R. Roetta Sessions, M.D. DATE OF BIRTH:  08-28-1950   DATE OF ADMISSION:  11/01/2005  DATE OF DISCHARGE:  LH                                HISTORY & PHYSICAL   CHIEF COMPLAINT:  Nausea and vomiting.   Mr. Mcneill is a 60 year old gentleman who is back to see me, accompanied  with his mother to further evaluate what is described as incessant nonstop  nausea and vomiting of 9-10 years' duration.  He has been evaluated here and  elsewhere on numerous occasions for the above mentioned symptoms to recap  the past more or less decade.  It is reported he may vomit 8 or 9 times a  day.  He may eat a meal and not even have a change to get up from the table,  he starts heaving and brings the food up.  He vomits and heaves at other  times too, sometimes in the middle of the night.  He has really had no  associated abdominal pain.   It is notable that his mother does most of the talking for him and answers  my questions directed at Mr. Gettel repeated, although Mr. Francesconi appears  to be fully able to converse about his symptoms.  This gentleman has been  extensively evaluated over the past several years, more recently he had a  fasting a.m. cortisol which came back normal at 9.3.  Small-bowel follow-  through was normal.  Gastric emptying study also revealed very good emptying  approximately 95% of contents empted over two hours.   He is status post cholecystectomy for cholelithiasis.  He had a duodenal  polyp on prior EGD which was removed.  It revealed some mild dysplasia.  A  follow-up endoscopy demonstrated normal mucosa in the area.  The area of  prior polypectomy was biopsied and this came back normal as well.  CT of the  head returned normal.  All of his labs looked okay. LFTs revealed  Gilbert's  syndrome.  He has not really improved to any significant degree by my  estimation with acid suppression therapy more recently in the way of AcipHex  20 mg orally daily.  Xanax and Phenergan have been added by Dr. Sherwood Gambler  without any apparently improvement in symptoms.  Of course, the Phenergan  has caused sedation.  Of note, I find interesting that he has actually  gained 6-1/2 pounds since his last visit with me on June 02, 2005.   Dr. Sherwood Gambler sent him down to [place], [facility], where he had all of  his teeth extracted secondary to poor dentition thinking this may have had  something to do with his nausea.  This was done several years ago.  His  lipase was minimally elevated at 61 back on May 25, 2004, which was felt  to be a nonspecific finding.   He has been repeatedly Hemoccult negative.  CT of the abdomen and pelvis with IV and oral contrast on May 24, 2004,  demonstrated small gallstones (precholecystectomy) with fatty infiltrations  of the liver and nonobstructive left renal calculus.  Colonoscopy on  March 22, 2004, however, screening (mother has history of colon cancer)  demonstrated a small rectal polyp which was removed.  This turned out to be  hyperplastic.  He is due for screening colonoscopy in 2011.  H. pylori  serologies previously came back negative.  Prior colonoscopy by Dr. 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  He also had some focal superficial adenomatous change from a polyp removed  from the hepatic flexure.   CURRENT MEDICATIONS:  1. Xanax 0.5 mg q.6h. p.r.n.  2. AcipHex 20 mg orally daily.  3. Phenergan 25 mg q.4h.  4. Hydrochlorothiazide 25 mg daily.  5. Claritin 10 mg daily.  6. Tylenol Extra Strength once daily p.r.n.   ALLERGIES:  TETANUS.   PAST MEDICAL HISTORY:  1. Mild mental slowing.  2. Distant history of peptic ulcer disease.   PAST SURGICAL HISTORY:  1.  Cholecystectomy.  2. Teeth extraction.  3. Right and left heel surgery.  4. Left hip pinning previously.   FAMILY HISTORY:  Mother had resection for colon cancer previously.  She has  history of hypertension.  Father died in a motor vehicle accident.  Otherwise no history of chronic GI or liver illness.   SOCIAL HISTORY:  The patient is single.  He lives with his mother.  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  No tobacco, no alcohol, no  illicit drugs.   REVIEW OF SYSTEMS:  No recent chest pain, dyspnea on exertion, weight gain  as outlined above.   PHYSICAL EXAMINATION:  GENERAL APPEARANCE:  A 60 year old gentleman who is  accompanied by his mother.  He appears in no acute distress.  He is alert,  conversant and oriented.  VITAL SIGNS:  Weight 248.5, height 6 feet 8 inches, temperature 99, blood  pressure 172/70, pulse 98.  SKIN:  Warm and dry.  There is no jaundice, no continued stigmata of chronic  liver disease.  HEENT:  No scleral icterus.  JVD is not prominent.  Oral cavity edentulous.  CHEST:  Lungs are clear to auscultation.  CARDIOVASCULAR:  Regular rate and rhythm without murmurs, rubs, or gallops.  ABDOMEN:  Nondistended, positive bowel sounds, soft, entirely nontender to  palpation, no appreciable mass or organomegaly.   ADDENDUM:  Labs from May 10, 2005, demonstrated a normal CBC.  LFTs okay  except for slightly elevated total bilirubin, predominantly indirect, total  bilirubin was 1.3.  TSH 0.936.   IMPRESSION:  Mr. Doering is a 60 year old gentleman who describes a decade  or so history of intermittent nausea and vomiting without associated  abdominal pain or early prodromal symptoms of true nausea.  He is gaining  weight.  He has had fairly exhaustive evaluation over the years.   Symptoms sound almost more like regurgitation/rumination than true nausea or  vomiting.  He has had a very minimally elevated serum lipase previously which I suspect  in this setting is nonspecific and not related to  pancreatitis.   I continue to suspect a significant functional component to his symptoms.  As I understand, Dr. Sherwood Gambler has ordered another EGD.  Mr. Konopka wants to  have one.  In this scenario it is not entirely unreasonable to go ahead and  do one more EGD since it  has been a couple of years since he last had one.  I told Mr. Mapel and his mother that my algorithm would be to proceed on  with an EGD, although the yield may be very low and repeat a serum lipase.   I had a frank discussion with them and explained to them that I doubt  additional blood work or another upper endoscopy would give [place] an answer to  his GI symptoms.  Ultimately, I feel he would best be served by seeing  somewhat like Dr. Claire Shown at [facility] who  specializes in nausea and vomiting.  They seem to understand.  I discussed  the risks, benefits, and alternatives of upper endoscopy.  Questions were  answered and they are agreeable and plan to perform the procedure in the  very near future.      Jonathon Bellows, M.D.  Electronically Signed     RMR/MEDQ  D:  11/01/2005  T:  11/01/2005  Job:  [id number]   cc:   Madelin Rear. Sherwood Gambler, MD  Fax: [phone]

## 2010-07-15 ENCOUNTER — Other Ambulatory Visit (HOSPITAL_COMMUNITY): Payer: Self-pay | Admitting: Internal Medicine

## 2010-07-15 DIAGNOSIS — R109 Unspecified abdominal pain: Secondary | ICD-10-CM

## 2010-07-20 ENCOUNTER — Emergency Department (HOSPITAL_COMMUNITY): Payer: Medicare Other

## 2010-07-20 ENCOUNTER — Ambulatory Visit (HOSPITAL_COMMUNITY)
Admission: RE | Admit: 2010-07-20 | Discharge: 2010-07-20 | Disposition: A | Discharge status: Home / Self Care | Payer: Medicare Other | Source: Ambulatory Visit | Attending: Internal Medicine | Admitting: Internal Medicine

## 2010-07-20 ENCOUNTER — Encounter (HOSPITAL_COMMUNITY): Payer: Self-pay | Admitting: Radiology

## 2010-07-20 ENCOUNTER — Emergency Department (HOSPITAL_COMMUNITY)
Admission: EM | Admit: 2010-07-20 | Discharge: 2010-07-20 | Disposition: A | Discharge status: Home / Self Care | Payer: Medicare Other | Attending: Emergency Medicine | Admitting: Emergency Medicine

## 2010-07-20 DIAGNOSIS — K219 Gastro-esophageal reflux disease without esophagitis: Secondary | ICD-10-CM | POA: Insufficient documentation

## 2010-07-20 DIAGNOSIS — R109 Unspecified abdominal pain: Secondary | ICD-10-CM

## 2010-07-20 DIAGNOSIS — R300 Dysuria: Secondary | ICD-10-CM | POA: Insufficient documentation

## 2010-07-20 DIAGNOSIS — Q619 Cystic kidney disease, unspecified: Secondary | ICD-10-CM | POA: Insufficient documentation

## 2010-07-20 DIAGNOSIS — Z79899 Other long term (current) drug therapy: Secondary | ICD-10-CM | POA: Insufficient documentation

## 2010-07-20 DIAGNOSIS — I10 Essential (primary) hypertension: Secondary | ICD-10-CM | POA: Insufficient documentation

## 2010-07-20 DIAGNOSIS — N289 Disorder of kidney and ureter, unspecified: Secondary | ICD-10-CM | POA: Insufficient documentation

## 2010-07-20 DIAGNOSIS — N509 Disorder of male genital organs, unspecified: Secondary | ICD-10-CM | POA: Insufficient documentation

## 2010-07-20 HISTORY — DX: Essential (primary) hypertension: I10

## 2010-07-20 LAB — CBC
HCT: 37.4 % — ABNORMAL LOW (ref 39.0–52.0)
Hemoglobin: 12.8 g/dL — ABNORMAL LOW (ref 13.0–17.0)
MCH: 30.3 pg (ref 26.0–34.0)
MCV: 88.6 fL (ref 78.0–100.0)
RBC: 4.22 MIL/uL (ref 4.22–5.81)

## 2010-07-20 LAB — DIFFERENTIAL
Lymphs Abs: 1.3 10*3/uL (ref 0.7–4.0)
Monocytes Relative: 9 % (ref 3–12)
Neutro Abs: 4 10*3/uL (ref 1.7–7.7)
Neutrophils Relative %: 67 % (ref 43–77)

## 2010-07-20 LAB — URINALYSIS, ROUTINE W REFLEX MICROSCOPIC
Bilirubin Urine: NEGATIVE
Glucose, UA: NEGATIVE mg/dL
Hgb urine dipstick: NEGATIVE
Ketones, ur: NEGATIVE mg/dL
pH: 5 (ref 5.0–8.0)

## 2010-07-20 LAB — COMPREHENSIVE METABOLIC PANEL
ALT: 19 U/L (ref 0–53)
Alkaline Phosphatase: 68 U/L (ref 39–117)
CO2: 28 mEq/L (ref 19–32)
Glucose, Bld: 107 mg/dL — ABNORMAL HIGH (ref 70–99)
Potassium: 4.2 mEq/L (ref 3.5–5.1)
Sodium: 141 mEq/L (ref 135–145)
Total Protein: 6.5 g/dL (ref 6.0–8.3)

## 2010-07-20 LAB — LIPASE, BLOOD: Lipase: 67 U/L — ABNORMAL HIGH (ref 11–59)

## 2010-07-20 MED ORDER — IOHEXOL 300 MG/ML  SOLN
100.0000 mL | Freq: Once | INTRAMUSCULAR | Status: AC | PRN
  Administered 2010-07-20: 100 mL via INTRAVENOUS

## 2010-07-27 ENCOUNTER — Ambulatory Visit (INDEPENDENT_AMBULATORY_CARE_PROVIDER_SITE_OTHER): Payer: Medicare Other | Admitting: Internal Medicine

## 2010-07-27 DIAGNOSIS — R197 Diarrhea, unspecified: Secondary | ICD-10-CM

## 2010-07-27 DIAGNOSIS — R112 Nausea with vomiting, unspecified: Secondary | ICD-10-CM

## 2010-08-23 ENCOUNTER — Other Ambulatory Visit (HOSPITAL_COMMUNITY): Payer: Self-pay | Admitting: Internal Medicine

## 2010-08-23 DIAGNOSIS — R079 Chest pain, unspecified: Secondary | ICD-10-CM

## 2010-08-25 ENCOUNTER — Other Ambulatory Visit (HOSPITAL_COMMUNITY): Payer: Medicare Other

## 2010-08-30 ENCOUNTER — Ambulatory Visit (HOSPITAL_COMMUNITY): Payer: Medicare Other | Attending: Internal Medicine

## 2010-09-09 ENCOUNTER — Ambulatory Visit (INDEPENDENT_AMBULATORY_CARE_PROVIDER_SITE_OTHER): Payer: Medicare Other | Admitting: Otolaryngology

## 2010-09-09 DIAGNOSIS — J310 Chronic rhinitis: Secondary | ICD-10-CM

## 2010-09-16 ENCOUNTER — Other Ambulatory Visit (HOSPITAL_COMMUNITY): Payer: Self-pay | Admitting: General Surgery

## 2010-09-20 ENCOUNTER — Ambulatory Visit (HOSPITAL_COMMUNITY)
Admission: RE | Admit: 2010-09-20 | Discharge: 2010-09-20 | Disposition: A | Discharge status: Home / Self Care | Payer: Medicare Other | Source: Ambulatory Visit | Attending: General Surgery | Admitting: General Surgery

## 2010-09-20 DIAGNOSIS — Z9889 Other specified postprocedural states: Secondary | ICD-10-CM | POA: Insufficient documentation

## 2010-09-20 DIAGNOSIS — R109 Unspecified abdominal pain: Secondary | ICD-10-CM | POA: Insufficient documentation

## 2010-09-20 MED ORDER — IOHEXOL 300 MG/ML  SOLN
100.0000 mL | Freq: Once | INTRAMUSCULAR | Status: AC | PRN
  Administered 2010-09-20: 100 mL via INTRAVENOUS

## 2010-10-11 ENCOUNTER — Encounter (INDEPENDENT_AMBULATORY_CARE_PROVIDER_SITE_OTHER): Payer: Self-pay | Admitting: *Deleted

## 2010-11-15 ENCOUNTER — Ambulatory Visit (INDEPENDENT_AMBULATORY_CARE_PROVIDER_SITE_OTHER): Payer: Medicare Other | Admitting: Internal Medicine

## 2010-11-22 LAB — BASIC METABOLIC PANEL
CO2: 30
GFR calc non Af Amer: 45 — ABNORMAL LOW
Glucose, Bld: 132 — ABNORMAL HIGH
Potassium: 3.8
Sodium: 140

## 2010-11-22 LAB — CBC
HCT: 41
Hemoglobin: 14.4
RDW: 13.5

## 2010-11-22 LAB — DIFFERENTIAL
Basophils Absolute: 0
Eosinophils Relative: 1
Lymphocytes Relative: 22
Monocytes Absolute: 0.3

## 2010-11-30 ENCOUNTER — Ambulatory Visit (INDEPENDENT_AMBULATORY_CARE_PROVIDER_SITE_OTHER): Payer: Medicare Other | Admitting: Internal Medicine

## 2010-12-07 ENCOUNTER — Ambulatory Visit (INDEPENDENT_AMBULATORY_CARE_PROVIDER_SITE_OTHER): Payer: Medicare Other | Admitting: Internal Medicine

## 2011-01-18 ENCOUNTER — Other Ambulatory Visit (HOSPITAL_COMMUNITY): Payer: Self-pay | Admitting: General Surgery

## 2011-01-18 DIAGNOSIS — R140 Abdominal distension (gaseous): Secondary | ICD-10-CM

## 2011-01-18 DIAGNOSIS — R112 Nausea with vomiting, unspecified: Secondary | ICD-10-CM

## 2011-01-25 ENCOUNTER — Ambulatory Visit (HOSPITAL_COMMUNITY): Payer: Medicare Other

## 2011-03-07 ENCOUNTER — Ambulatory Visit (INDEPENDENT_AMBULATORY_CARE_PROVIDER_SITE_OTHER): Payer: Medicare Other | Admitting: Internal Medicine

## 2011-03-08 ENCOUNTER — Encounter (INDEPENDENT_AMBULATORY_CARE_PROVIDER_SITE_OTHER): Payer: Self-pay | Admitting: Internal Medicine

## 2011-03-08 ENCOUNTER — Ambulatory Visit (INDEPENDENT_AMBULATORY_CARE_PROVIDER_SITE_OTHER): Payer: Medicare Other | Admitting: Internal Medicine

## 2011-03-08 DIAGNOSIS — I10 Essential (primary) hypertension: Secondary | ICD-10-CM | POA: Insufficient documentation

## 2011-03-08 DIAGNOSIS — D696 Thrombocytopenia, unspecified: Secondary | ICD-10-CM

## 2011-03-08 DIAGNOSIS — R110 Nausea: Secondary | ICD-10-CM

## 2011-03-08 DIAGNOSIS — R1013 Epigastric pain: Secondary | ICD-10-CM

## 2011-03-08 DIAGNOSIS — K219 Gastro-esophageal reflux disease without esophagitis: Secondary | ICD-10-CM

## 2011-03-08 NOTE — Patient Instructions (Signed)
Aciphex BID. CBC today checking platelet. PR in 2 weeks. OV 6 months.

## 2011-03-08 NOTE — Progress Notes (Signed)
Subjective:     Patient ID: [name], male   DOB: 1950/05/31, 61 y.o.   MRN: [id number]  HPI  [name] is a 61 yr old male presenting today with c/o chronic vomiting for over 20 yrs. He is presently taking Phenergan for the nausea.  At present he is being treated for a sinus infection with a Zpak.  He also is taking Cipro for a UTI, His last episode of  vomiting was this am.  From Dr. Patty Sermons notes, he has had an extensive workup.  He had all of his teeth extracted but this did not help with his nausea. He has also been treated for sinusitis but without benefit.  He has undergone a cholecystectomy and multiple other studies.  In May of last year he underwent and EGD followed by a flexible sigmoidoscopy. EGD was normal.  Biopsies were taking from duodenum looking for celiac disease and these were normal.  Flexible sigmoidoscopy was normal.  He does c/o epigastric pain.  His appetite is good. There has been no weight loss. He has a BM about 2-3 times a day. Stools are usually formed, but some are loose. [name] also has a hx of mild  Thrombocytopenia.  He has imaging studies which revealed a fatty liver, but nothing to suggest cirrhosis.  Review of Systems see hpi     Objective:   Physical Exam  Filed Vitals:   03/08/11 1406  Height: 6\' 7"  (2.007 m)  Weight: 236 lb 6.4 oz (107.23 kg)    Alert and oriented. Skin warm and dry. Oral mucosa is moist.   . Sclera anicteric, conjunctivae is pink. Thyroid not enlarged. No cervical lymphadenopathy. Lungs clear. Heart regular rate and rhythm.  Abdomen is soft. Bowel sounds are positive. No hepatomegaly. No abdominal masses felt. No tenderness.  No edema to lower extremities. Patient is alert and oriented.     Assessment:    Chronic nausea and epigastric pain. He is presently taking Zantac for GERD.  His EGD back in May was normal.  Thrombocytopenia.      Plan:    I would like for him to stop the Zantac and I am going to try him on Aciphex BID.   Continue with the Phenergan as needed. OV in 6 months. PR in 2 weeks. CBC checking platelet ct.

## 2011-03-09 LAB — CBC WITH DIFFERENTIAL/PLATELET
Basophils Absolute: 0 10*3/uL (ref 0.0–0.1)
Basophils Relative: 1 % (ref 0–1)
Eosinophils Absolute: 0.1 10*3/uL (ref 0.0–0.7)
Eosinophils Relative: 2 % (ref 0–5)
HCT: 40.2 % (ref 39.0–52.0)
MCH: 30.4 pg (ref 26.0–34.0)
MCHC: 34.1 g/dL (ref 30.0–36.0)
MCV: 89.1 fL (ref 78.0–100.0)
Monocytes Absolute: 0.5 10*3/uL (ref 0.1–1.0)
Neutro Abs: 3.6 10*3/uL (ref 1.7–7.7)
RDW: 13.5 % (ref 11.5–15.5)

## 2011-04-15 ENCOUNTER — Other Ambulatory Visit (HOSPITAL_COMMUNITY): Payer: Self-pay | Admitting: Internal Medicine

## 2011-04-15 DIAGNOSIS — I10 Essential (primary) hypertension: Secondary | ICD-10-CM

## 2011-04-15 DIAGNOSIS — R51 Headache: Secondary | ICD-10-CM

## 2011-04-15 DIAGNOSIS — R55 Syncope and collapse: Secondary | ICD-10-CM

## 2011-04-19 ENCOUNTER — Ambulatory Visit (HOSPITAL_COMMUNITY)
Admission: RE | Admit: 2011-04-19 | Discharge: 2011-04-19 | Disposition: A | Discharge status: Home / Self Care | Payer: Medicare Other | Source: Ambulatory Visit | Attending: Internal Medicine | Admitting: Internal Medicine

## 2011-04-19 DIAGNOSIS — I10 Essential (primary) hypertension: Secondary | ICD-10-CM | POA: Insufficient documentation

## 2011-04-19 DIAGNOSIS — R55 Syncope and collapse: Secondary | ICD-10-CM | POA: Insufficient documentation

## 2011-04-19 DIAGNOSIS — R51 Headache: Secondary | ICD-10-CM | POA: Insufficient documentation

## 2011-07-18 ENCOUNTER — Other Ambulatory Visit (HOSPITAL_COMMUNITY): Payer: Self-pay | Admitting: Internal Medicine

## 2011-07-18 ENCOUNTER — Ambulatory Visit (HOSPITAL_COMMUNITY)
Admission: RE | Admit: 2011-07-18 | Discharge: 2011-07-18 | Disposition: A | Discharge status: Home / Self Care | Payer: PRIVATE HEALTH INSURANCE | Source: Ambulatory Visit | Attending: Internal Medicine | Admitting: Internal Medicine

## 2011-07-18 DIAGNOSIS — R197 Diarrhea, unspecified: Secondary | ICD-10-CM

## 2011-07-18 DIAGNOSIS — IMO0002 Reserved for concepts with insufficient information to code with codable children: Secondary | ICD-10-CM

## 2011-07-18 DIAGNOSIS — T148XXA Other injury of unspecified body region, initial encounter: Secondary | ICD-10-CM

## 2011-07-18 DIAGNOSIS — M7730 Calcaneal spur, unspecified foot: Secondary | ICD-10-CM | POA: Insufficient documentation

## 2011-07-18 DIAGNOSIS — M25579 Pain in unspecified ankle and joints of unspecified foot: Secondary | ICD-10-CM | POA: Insufficient documentation

## 2011-07-18 DIAGNOSIS — I10 Essential (primary) hypertension: Secondary | ICD-10-CM

## 2011-07-18 DIAGNOSIS — R52 Pain, unspecified: Secondary | ICD-10-CM

## 2011-07-18 DIAGNOSIS — M25572 Pain in left ankle and joints of left foot: Secondary | ICD-10-CM

## 2011-07-18 DIAGNOSIS — F411 Generalized anxiety disorder: Secondary | ICD-10-CM

## 2011-08-15 DIAGNOSIS — Z0189 Encounter for other specified special examinations: Secondary | ICD-10-CM

## 2011-08-15 HISTORY — DX: Encounter for other specified special examinations: Z01.89

## 2011-08-25 ENCOUNTER — Encounter (INDEPENDENT_AMBULATORY_CARE_PROVIDER_SITE_OTHER): Payer: Self-pay | Admitting: *Deleted

## 2011-08-28 ENCOUNTER — Encounter (HOSPITAL_COMMUNITY): Payer: Self-pay

## 2011-08-28 ENCOUNTER — Emergency Department (HOSPITAL_COMMUNITY)
Admission: EM | Admit: 2011-08-28 | Discharge: 2011-08-28 | Disposition: A | Discharge status: Home / Self Care | Payer: PRIVATE HEALTH INSURANCE | Attending: Emergency Medicine | Admitting: Emergency Medicine

## 2011-08-28 ENCOUNTER — Emergency Department (HOSPITAL_COMMUNITY): Payer: PRIVATE HEALTH INSURANCE

## 2011-08-28 DIAGNOSIS — Z79899 Other long term (current) drug therapy: Secondary | ICD-10-CM | POA: Insufficient documentation

## 2011-08-28 DIAGNOSIS — Z7982 Long term (current) use of aspirin: Secondary | ICD-10-CM | POA: Insufficient documentation

## 2011-08-28 DIAGNOSIS — N390 Urinary tract infection, site not specified: Secondary | ICD-10-CM | POA: Insufficient documentation

## 2011-08-28 DIAGNOSIS — I10 Essential (primary) hypertension: Secondary | ICD-10-CM | POA: Insufficient documentation

## 2011-08-28 LAB — CBC WITH DIFFERENTIAL/PLATELET
Eosinophils Absolute: 0.1 10*3/uL (ref 0.0–0.7)
Eosinophils Relative: 1 % (ref 0–5)
Hemoglobin: 14.3 g/dL (ref 13.0–17.0)
Lymphocytes Relative: 20 % (ref 12–46)
Lymphs Abs: 1.3 10*3/uL (ref 0.7–4.0)
MCH: 30.8 pg (ref 26.0–34.0)
MCV: 89.2 fL (ref 78.0–100.0)
Monocytes Relative: 7 % (ref 3–12)
Neutrophils Relative %: 72 % (ref 43–77)
RBC: 4.65 MIL/uL (ref 4.22–5.81)
WBC: 6.2 10*3/uL (ref 4.0–10.5)

## 2011-08-28 LAB — URINALYSIS, ROUTINE W REFLEX MICROSCOPIC
Bilirubin Urine: NEGATIVE
Hgb urine dipstick: NEGATIVE
Protein, ur: NEGATIVE mg/dL
Urobilinogen, UA: 0.2 mg/dL (ref 0.0–1.0)

## 2011-08-28 LAB — COMPREHENSIVE METABOLIC PANEL
Alkaline Phosphatase: 51 U/L (ref 39–117)
BUN: 17 mg/dL (ref 6–23)
CO2: 25 mEq/L (ref 19–32)
GFR calc Af Amer: 48 mL/min — ABNORMAL LOW (ref >90)
GFR calc non Af Amer: 42 mL/min — ABNORMAL LOW (ref >90)
Glucose, Bld: 205 mg/dL — ABNORMAL HIGH (ref 70–99)
Potassium: 3.6 mEq/L (ref 3.5–5.1)
Total Protein: 7.1 g/dL (ref 6.0–8.3)

## 2011-08-28 MED ORDER — TRAMADOL HCL 50 MG PO TABS: 50.0000 mg | ORAL_TABLET | Freq: Four times a day (QID) | ORAL | Status: AC | PRN

## 2011-08-28 MED ORDER — ONDANSETRON HCL 4 MG/2ML IJ SOLN
4.0000 mg | Freq: Once | INTRAMUSCULAR | Status: AC
  Administered 2011-08-28: 4 mg via INTRAVENOUS
  Filled 2011-08-28: qty 2

## 2011-08-28 MED ORDER — SODIUM CHLORIDE 0.9 % IV SOLN
Freq: Once | INTRAVENOUS | Status: AC
  Administered 2011-08-28: 1000 mL via INTRAVENOUS

## 2011-08-28 MED ORDER — CIPROFLOXACIN HCL 500 MG PO TABS: 500.0000 mg | ORAL_TABLET | Freq: Two times a day (BID) | ORAL | Status: AC

## 2011-08-28 MED ORDER — HYDROCODONE-ACETAMINOPHEN 5-325 MG PO TABS
1.0000 | ORAL_TABLET | Freq: Once | ORAL | Status: AC
  Administered 2011-08-28: 1 via ORAL
  Filled 2011-08-28: qty 1

## 2011-08-28 MED ORDER — KETOROLAC TROMETHAMINE 30 MG/ML IJ SOLN
30.0000 mg | Freq: Once | INTRAMUSCULAR | Status: AC
  Administered 2011-08-28: 30 mg via INTRAVENOUS
  Filled 2011-08-28: qty 1

## 2011-08-28 MED ORDER — CIPROFLOXACIN HCL 250 MG PO TABS
500.0000 mg | ORAL_TABLET | Freq: Once | ORAL | Status: AC
  Administered 2011-08-28: 500 mg via ORAL
  Filled 2011-08-28: qty 2

## 2011-08-28 NOTE — ED Notes (Signed)
Pt reports left sided abd pain since 3 am yesterday.  Reports has vomited x 5 today and has had diarrhea x 3 today.  Reports has chronic vomiting and diarrhea x 35 years.

## 2011-08-28 NOTE — ED Notes (Signed)
Dr. Zammit in room talking with pt  

## 2011-08-28 NOTE — ED Notes (Signed)
Pt returned from xray

## 2011-08-28 NOTE — ED Notes (Signed)
Left lower side abd pain, n/v, diarrhea, pt states that the pain started a few days ago, worse with movement, pt also has issues with n/v/d that has gotten worse over the past few days, pt states that "I have had problems with n/v/d for over 35 years and have been "worked up" by 5 different hospitals with no results found

## 2011-08-28 NOTE — ED Provider Notes (Cosign Needed)
History  This chart was scribed for Benny Lennert, MD by Ladona Ridgel Day. This patient was seen in room APA08/APA08 and the patient's care was started at 1150.  CSN: [id number]  Arrival date & time 08/28/11  1150   First MD Initiated Contact with Patient 08/28/11 1227      Chief Complaint  Patient presents with  . Abdominal Pain    Patient is a 61 y.o. male presenting with abdominal pain. The history is provided by the patient. No language interpreter was used.  Abdominal Pain The primary symptoms of the illness include abdominal pain (Left flank/left abdominal pain. ), fever and vomiting. The primary symptoms of the illness do not include fatigue, shortness of breath or diarrhea. The current episode started yesterday. The onset of the illness was gradual. The problem has been gradually worsening.  The abdominal pain began 2 days ago. The pain came on gradually. The abdominal pain has been unchanged since its onset. Pain Location: left flank.  Symptoms associated with the illness do not include hematuria, frequency or back pain.   [name] is a 61 y.o. male who presents to the Emergency Department with a Kidney stone history complaining of gradually worsening constant left flank pain for 1 day which began around noon and is still present. He denies any modifying factors for his pain. He states he has had emesis episodes for some amount of time and a mild fever as associated symptoms. He denies any cough/congestion, and SOB at this time.  He is followed by his PCP Dr. Lorayne Bender Past Medical History  Diagnosis Date  . Hypertension     Past Surgical History  Procedure Date  . Hernia repair     x 2  . Nasal polyp surgery      2-3 months ago  . Cholecystectomy   . Hip surgery     Rt hip age 19    No family history on file.  History  Substance Use Topics  . Smoking status: Never Smoker   . Smokeless tobacco: Not on file  . Alcohol Use: No      Review of Systems    Constitutional: Positive for fever. Negative for fatigue.  HENT: Negative for congestion, sinus pressure and ear discharge.   Eyes: Negative for discharge.  Respiratory: Negative for cough and shortness of breath.   Cardiovascular: Negative for chest pain.  Gastrointestinal: Positive for vomiting and abdominal pain (Left flank/left abdominal pain. ). Negative for diarrhea and abdominal distention.  Genitourinary: Negative for frequency and hematuria.  Musculoskeletal: Negative for back pain.  Skin: Negative for rash.  Neurological: Negative for seizures and headaches.  Hematological: Negative.   Psychiatric/Behavioral: Negative for hallucinations.  All other systems reviewed and are negative.    Allergies  Penicillins and Tetanus toxoids  Home Medications   Current Outpatient Rx  Name Route Sig Dispense Refill  . ALPRAZOLAM 0.5 MG PO TABS Oral Take 0.5 mg by mouth at bedtime as needed.    . ASPIRIN 81 MG PO TABS Oral Take 160 mg by mouth daily.    . AZITHROMYCIN 250 MG PO TABS Oral Take 250 mg by mouth daily.    Marland Kitchen CIPROFLOXACIN HCL 500 MG PO TABS Oral Take 500 mg by mouth 2 (two) times daily.    Marland Kitchen DIPHENOXYLATE-ATROPINE 2.5-0.025 MG PO TABS Oral Take 1 tablet by mouth 4 (four) times daily as needed.    . ENALAPRIL MALEATE 5 MG PO TABS Oral Take 5 mg by mouth daily.    Marland Kitchen  HYDROCODONE-ACETAMINOPHEN 10-500 MG PO TABS Oral Take 1 tablet by mouth every 6 (six) hours as needed.    Marland Kitchen METOPROLOL TARTRATE 25 MG PO TABS Oral Take 25 mg by mouth 2 (two) times daily.    Marland Kitchen PROMETHAZINE HCL 25 MG PO TABS Oral Take 25 mg by mouth every 6 (six) hours as needed.    Marland Kitchen RANITIDINE HCL 150 MG PO TABS Oral Take 150 mg by mouth 2 (two) times daily.    Marland Kitchen SIMETHICONE 125 MG PO CHEW Oral Chew 125 mg by mouth every 6 (six) hours as needed.      Triage Vitals: BP 152/84  Pulse 117  Temp 99.1 F (37.3 C) (Oral)  Resp 20  Ht 6\' 9"  (2.057 m)  Wt 232 lb (105.235 kg)  BMI 24.86 kg/m2  SpO2  96%  Physical Exam  Nursing note and vitals reviewed. Constitutional: He is oriented to person, place, and time. He appears well-developed.  HENT:  Head: Normocephalic and atraumatic.  Eyes: Conjunctivae and EOM are normal. No scleral icterus.  Neck: Neck supple. No thyromegaly present.  Cardiovascular: Normal rate and regular rhythm.  Exam reveals no gallop and no friction rub.   No murmur heard. Pulmonary/Chest: Effort normal. No stridor. He has no wheezes. He has no rales. He exhibits no tenderness.  Abdominal: Soft. He exhibits no distension. There is tenderness (Mild left flank tenderness. ). There is no rebound.  Musculoskeletal: Normal range of motion. He exhibits no edema.  Lymphadenopathy:    He has no cervical adenopathy.  Neurological: He is alert and oriented to person, place, and time. Coordination normal.  Skin: Skin is warm. No rash noted. No erythema.  Psychiatric: He has a normal mood and affect. His behavior is normal.    ED Course  Procedures (including critical care time) DIAGNOSTIC STUDIES: Oxygen Saturation is 96% on room air, adequate by my interpretation.    COORDINATION OF CARE: At 1229 PM Discussed treatment plan with patient which includes urine analysis, nausea/pain medicines. Patient agrees.   Labs Reviewed  URINALYSIS, ROUTINE W REFLEX MICROSCOPIC - Abnormal; Notable for the following:    Specific Gravity, Urine >1.030 (*)     Leukocytes, UA TRACE (*)     All other components within normal limits  URINE MICROSCOPIC-ADD ON - Abnormal; Notable for the following:    Squamous Epithelial / LPF FEW (*)     Bacteria, UA FEW (*)     All other components within normal limits   No results found.   No diagnosis found.    MDM  The chart was scribed for me under my direct supervision.  I personally performed the history, physical, and medical decision making and all procedures in the evaluation of this patient.Benny Lennert,  MD 08/28/11 1416

## 2011-08-28 NOTE — ED Notes (Signed)
Pt given something to drink per his request, updated on plan of care,

## 2011-08-30 LAB — URINE CULTURE: Colony Count: 30000

## 2011-09-06 ENCOUNTER — Ambulatory Visit (INDEPENDENT_AMBULATORY_CARE_PROVIDER_SITE_OTHER): Payer: PRIVATE HEALTH INSURANCE | Admitting: Internal Medicine

## 2011-09-06 ENCOUNTER — Encounter (INDEPENDENT_AMBULATORY_CARE_PROVIDER_SITE_OTHER): Payer: Self-pay | Admitting: Internal Medicine

## 2011-09-06 VITALS — BP 140/62 | HR 80 | Temp 98.9°F | Ht 77.0 in | Wt 238.6 lb

## 2011-09-06 DIAGNOSIS — R197 Diarrhea, unspecified: Secondary | ICD-10-CM | POA: Insufficient documentation

## 2011-09-06 DIAGNOSIS — R110 Nausea: Secondary | ICD-10-CM | POA: Insufficient documentation

## 2011-09-06 DIAGNOSIS — M109 Gout, unspecified: Secondary | ICD-10-CM | POA: Insufficient documentation

## 2011-09-06 DIAGNOSIS — D691 Qualitative platelet defects: Secondary | ICD-10-CM

## 2011-09-06 MED ORDER — PROMETHAZINE HCL 25 MG PO TABS: 12.5000 mg | ORAL_TABLET | Freq: Four times a day (QID) | ORAL | Status: AC | PRN

## 2011-09-06 MED ORDER — LOPERAMIDE HCL 2 MG PO TABS: 2.0000 mg | ORAL_TABLET | Freq: Four times a day (QID) | ORAL | Status: DC | PRN

## 2011-09-06 NOTE — Progress Notes (Signed)
Subjective:     Patient ID: [name], male   DOB: 1950/12/23, 61 y.o.   MRN: [id number]  HPI [name] is a 61 yr old male presenting today for f/u of his chronic nausea and vomiting for over 20 yrs. He has had an extensive workup and has actually been seen at [facility].  He has nausea about 3 times a day, usually in morning and in the evening. He has nausea after he eats. He has gained 2 pounds since his last visit in January.  He has 3-4 stools a day. Most of his stools are loose. He on a rare occasion see blood with his BM .  Sigmoidoscopy in May of 2012 at [facility] was normal.  He underwent an EGD 06/29/2010 at [facility] and was normal.  [name] also has a hx of mild Thrombocytopenia. He has imaging studies which revealed a fatty liver, but nothing to suggest cirrhosis. Platelets normal 08/28/2011.  Presently being treated for a UTI with Cipro. CBC    Component Value Date/Time   WBC 6.2 08/28/2011 1234   RBC 4.65 08/28/2011 1234   HGB 14.3 08/28/2011 1234   HCT 41.5 08/28/2011 1234   PLT 159 08/28/2011 1234   MCV 89.2 08/28/2011 1234   MCH 30.8 08/28/2011 1234   MCHC 34.5 08/28/2011 1234   RDW 13.7 08/28/2011 1234   LYMPHSABS 1.3 08/28/2011 1234   MONOABS 0.4 08/28/2011 1234   EOSABS 0.1 08/28/2011 1234   BASOSABS 0.0 08/28/2011 1234       Review of Systems see hpi Current Outpatient Prescriptions  Medication Sig Dispense Refill  . acetaminophen (TYLENOL) 500 MG tablet Take 1,000 mg by mouth every 6 (six) hours as needed. Pain      . ALPRAZolam (XANAX) 0.5 MG tablet Take 0.5 mg by mouth 4 (four) times daily.       Marland Kitchen aspirin 81 MG tablet Take 160 mg by mouth daily.      . ciprofloxacin (CIPRO) 500 MG tablet Take 1 tablet (500 mg total) by mouth 2 (two) times daily.  14 tablet  0  . diphenoxylate-atropine (LOMOTIL) 2.5-0.025 MG per tablet Take 1 tablet by mouth 4 (four) times daily as needed. Diarrhea      . enalapril (VASOTEC) 10 MG tablet Take 10 mg by mouth 2 (two) times daily.      . febuxostat  (ULORIC) 40 MG tablet Take 80 mg by mouth daily as needed. Swelling of the feet.      . isosorbide mononitrate (IMDUR) 30 MG 24 hr tablet Take 30 mg by mouth daily.      . metoprolol tartrate (LOPRESSOR) 25 MG tablet Take 25 mg by mouth 2 (two) times daily.      . nitroGLYCERIN (NITROSTAT) 0.4 MG SL tablet Place 0.4 mg under the tongue every 5 (five) minutes as needed. Chest Pain      . sertraline (ZOLOFT) 50 MG tablet Take 50 mg by mouth daily.      . simethicone (MYLICON) 125 MG chewable tablet Chew 500 mg by mouth 2 (two) times daily.      . traMADol (ULTRAM) 50 MG tablet Take 1 tablet (50 mg total) by mouth every 6 (six) hours as needed for pain.  15 tablet  0   Past Medical History  Diagnosis Date  . Hypertension   . Nausea   . Gout    History   Social History  . Marital Status: Single    Spouse Name: N/A  Number of Children: N/A  . Years of Education: N/A   Occupational History  . Not on file.   Social History Main Topics  . Smoking status: Never Smoker   . Smokeless tobacco: Not on file  . Alcohol Use: No  . Drug Use: No  . Sexually Active: Not on file   Other Topics Concern  . Not on file   Social History Narrative  . No narrative on file   Family Status  Relation Status Death Age  . Mother Deceased     multiple medical problems.  . Father Deceased     Cancer  . Sister Alive     Polio ag3 3  . Brother Alive     One has colon cancer and is a diabetic. One has had hernia surgery.   Allergies  Allergen Reactions  . Penicillins Other (See Comments)    Unknown  . Tetanus Toxoids Rash       Objective:   Physical Exam Filed Vitals:   09/06/11 1419  Height: 6\' 5"  (1.956 m)  Weight: 238 lb 9.6 oz (108.228 kg)  Alert and oriented. Skin warm and dry. Oral mucosa is moist.   . Sclera anicteric, conjunctivae is pink. Thyroid not enlarged. No cervical lymphadenopathy. Lungs clear. Heart regular rate and rhythm.  Abdomen is soft. Bowel sounds are positive. No  hepatomegaly. No abdominal masses felt. No tenderness.  No edema to lower extremities.        Assessment:    Chronic nausea.  Diarrhea. He has had a negative work up in the past.  CBC was normal.      Plan:     Stop the Simethicone. Phenergan 12.5mg  q 6 hrs has needed for nausea. Imodium 2mg  BID for his diarrhea. OV in 6 months There has been no weight loss

## 2011-09-06 NOTE — Patient Instructions (Addendum)
Stop Simethicone. Phenergan 12.5mg  q 6 hrs for nausea. Imodium 2mg  BID on schedule for diarrhea.

## 2011-09-14 DIAGNOSIS — Z9289 Personal history of other medical treatment: Secondary | ICD-10-CM

## 2011-09-14 HISTORY — DX: Personal history of other medical treatment: Z92.89

## 2012-01-20 ENCOUNTER — Other Ambulatory Visit (INDEPENDENT_AMBULATORY_CARE_PROVIDER_SITE_OTHER): Payer: Self-pay | Admitting: Internal Medicine

## 2012-03-08 ENCOUNTER — Encounter (INDEPENDENT_AMBULATORY_CARE_PROVIDER_SITE_OTHER): Payer: Self-pay | Admitting: Internal Medicine

## 2012-03-08 ENCOUNTER — Ambulatory Visit (INDEPENDENT_AMBULATORY_CARE_PROVIDER_SITE_OTHER): Payer: PRIVATE HEALTH INSURANCE | Admitting: Internal Medicine

## 2012-03-08 VITALS — BP 152/66 | HR 80 | Temp 99.3°F | Ht 76.0 in | Wt 240.8 lb

## 2012-03-08 DIAGNOSIS — D696 Thrombocytopenia, unspecified: Secondary | ICD-10-CM

## 2012-03-08 DIAGNOSIS — K219 Gastro-esophageal reflux disease without esophagitis: Secondary | ICD-10-CM | POA: Insufficient documentation

## 2012-03-08 LAB — CBC WITH DIFFERENTIAL/PLATELET
Basophils Absolute: 0 10*3/uL (ref 0.0–0.1)
Basophils Relative: 1 % (ref 0–1)
HCT: 42.1 % (ref 39.0–52.0)
Lymphocytes Relative: 18 % (ref 12–46)
Neutro Abs: 4.7 10*3/uL (ref 1.7–7.7)
Neutrophils Relative %: 70 % (ref 43–77)
Platelets: 193 10*3/uL (ref 150–400)
RDW: 14 % (ref 11.5–15.5)
WBC: 6.7 10*3/uL (ref 4.0–10.5)

## 2012-03-08 MED ORDER — OMEPRAZOLE 40 MG PO CPDR: 40.0000 mg | DELAYED_RELEASE_CAPSULE | Freq: Every day | ORAL | Status: DC

## 2012-03-08 NOTE — Progress Notes (Signed)
Subjective:     Patient ID: [name], male   DOB: [birth year], 62 y.o.   MRN: [id number]  HPIHere today for f/u. He tells me he is doing okay.  He c/o some lower abdominal pain. Pain starts after he eats.  He occasionally has acid reflux. Caregiver says he has had acid reflux 5 out of 7 days. His BMs are normal. No diarrhea.  Caregiver has not seen any nausea or vomiting. Hx of thrombocytopenia.  Sigmoidoscopy in May of 2012 at [facility] was normal.  He underwent an EGD 06/29/2010 at [facility] and was normal. [name] also has a hx of mild Thrombocytopenia. He has imaging studies which revealed a fatty liver, but nothing to suggest cirrhosis. Platelets normal 08/28/2011.  CBC    Component Value Date/Time   WBC 6.2 08/28/2011 1234   RBC 4.65 08/28/2011 1234   HGB 14.3 08/28/2011 1234   HCT 41.5 08/28/2011 1234   PLT 159 08/28/2011 1234   MCV 89.2 08/28/2011 1234   MCH 30.8 08/28/2011 1234   MCHC 34.5 08/28/2011 1234   RDW 13.7 08/28/2011 1234   LYMPHSABS 1.3 08/28/2011 1234   MONOABS 0.4 08/28/2011 1234   EOSABS 0.1 08/28/2011 1234   BASOSABS 0.0 08/28/2011 1234   CMP     Component Value Date/Time   NA 133* 08/28/2011 1234   K 3.6 08/28/2011 1234   CL 99 08/28/2011 1234   CO2 25 08/28/2011 1234   GLUCOSE 205* 08/28/2011 1234   BUN 17 08/28/2011 1234   CREATININE 1.71* 08/28/2011 1234   CALCIUM 9.2 08/28/2011 1234   PROT 7.1 08/28/2011 1234   ALBUMIN 3.8 08/28/2011 1234   AST 20 08/28/2011 1234   ALT 18 08/28/2011 1234   ALKPHOS 51 08/28/2011 1234   BILITOT 1.1 08/28/2011 1234   GFRNONAA 42* 08/28/2011 1234   GFRAA 48* 08/28/2011 1234       Review of Systems Current Outpatient Prescriptions  Medication Sig Dispense Refill  . acetaminophen (TYLENOL) 500 MG tablet Take 1,000 mg by mouth every 6 (six) hours as needed. Pain      . ALPRAZolam (XANAX) 0.5 MG tablet Take 0.5 mg by mouth 4 (four) times daily.       Marland Kitchen aspirin 81 MG tablet Take 160 mg by mouth daily.      . cholecalciferol (VITAMIN D) 400  UNITS TABS Take by mouth.      . enalapril (VASOTEC) 10 MG tablet Take 10 mg by mouth 2 (two) times daily.      . febuxostat (ULORIC) 40 MG tablet Take 80 mg by mouth daily as needed. Swelling of the feet.      . isosorbide mononitrate (IMDUR) 30 MG 24 hr tablet Take 30 mg by mouth daily.      Marland Kitchen loperamide (IMODIUM) 2 MG capsule TAKE ONE CAPSULE FOUR TIMES DAILY AS NEEDED FOR DIARRHEA OR LOOSE STOOLS  120 capsule  2  . metoprolol tartrate (LOPRESSOR) 25 MG tablet Take 25 mg by mouth 2 (two) times daily.      . sertraline (ZOLOFT) 50 MG tablet Take 50 mg by mouth daily.      . traMADol (ULTRAM) 50 MG tablet Take 50 mg by mouth every 6 (six) hours as needed.      . nitroGLYCERIN (NITROSTAT) 0.4 MG SL tablet Place 0.4 mg under the tongue every 5 (five) minutes as needed. Chest Pain      . omeprazole (PRILOSEC) 40 MG capsule Take 1 capsule (40 mg  total) by mouth daily.  90 capsule  3   Past Medical History  Diagnosis Date  . Hypertension   . Nausea   . Gout   \ Past Surgical History  Procedure Date  . Hernia repair     x 2  . Nasal polyp surgery      2-3 months ago  . Cholecystectomy   . Hip surgery     Rt hip age 33   Allergies  Allergen Reactions  . Penicillins Other (See Comments)    Unknown  . Tetanus Toxoids Rash      Objective:   Physical Exam Filed Vitals:   03/08/12 1432  BP: 152/66  Pulse: 80  Temp: 99.3 F (37.4 C)  Height: 6\' 4"  (1.93 m)  Weight: 240 lb 12.8 oz (109.226 kg)  Alert and oriented. Skin warm and dry. Oral mucosa is moist.   . Sclera anicteric, conjunctivae is pink. Thyroid not enlarged. No cervical lymphadenopathy. Lungs clear. Heart regular rate and rhythm.  Abdomen is soft. Bowel sounds are positive. No hepatomegaly. No abdominal masses felt. No tenderness.  No edema to lower extremities. Patient is alert and oriented.      Assessment:     Lower abdominal pain after eating, possible IBS. Thrombocyrtopenia, mild. Last labs were normal. GERD: not  controlled at this time. No PPIs at present.    Plan:    OV in 6 months. CBC. Omeprazole 40mg  po daily.

## 2012-03-08 NOTE — Patient Instructions (Addendum)
OV in 6 months. Continue present medications. Rx for Omeprazole 40mg  eprescribed to his pharmacy.

## 2012-04-30 ENCOUNTER — Other Ambulatory Visit (HOSPITAL_COMMUNITY): Payer: Self-pay | Admitting: Internal Medicine

## 2012-05-02 ENCOUNTER — Ambulatory Visit (HOSPITAL_COMMUNITY)
Admission: RE | Admit: 2012-05-02 | Discharge: 2012-05-02 | Disposition: A | Discharge status: Home / Self Care | Payer: PRIVATE HEALTH INSURANCE | Source: Ambulatory Visit | Attending: Internal Medicine | Admitting: Internal Medicine

## 2012-05-02 DIAGNOSIS — R109 Unspecified abdominal pain: Secondary | ICD-10-CM | POA: Insufficient documentation

## 2012-05-02 DIAGNOSIS — Q619 Cystic kidney disease, unspecified: Secondary | ICD-10-CM | POA: Insufficient documentation

## 2012-06-05 ENCOUNTER — Other Ambulatory Visit (INDEPENDENT_AMBULATORY_CARE_PROVIDER_SITE_OTHER): Payer: Self-pay | Admitting: Internal Medicine

## 2012-06-11 ENCOUNTER — Encounter (INDEPENDENT_AMBULATORY_CARE_PROVIDER_SITE_OTHER): Payer: Self-pay | Admitting: *Deleted

## 2012-09-04 ENCOUNTER — Encounter: Payer: Self-pay | Admitting: *Deleted

## 2012-09-05 ENCOUNTER — Ambulatory Visit (INDEPENDENT_AMBULATORY_CARE_PROVIDER_SITE_OTHER): Payer: PRIVATE HEALTH INSURANCE | Admitting: Internal Medicine

## 2012-09-05 ENCOUNTER — Encounter: Payer: Self-pay | Admitting: Internal Medicine

## 2012-09-06 ENCOUNTER — Encounter: Payer: Self-pay | Admitting: Internal Medicine

## 2012-09-06 ENCOUNTER — Ambulatory Visit (INDEPENDENT_AMBULATORY_CARE_PROVIDER_SITE_OTHER): Payer: PRIVATE HEALTH INSURANCE | Admitting: Internal Medicine

## 2012-09-06 VITALS — BP 102/70 | HR 71 | Ht 77.0 in | Wt 224.0 lb

## 2012-09-06 DIAGNOSIS — F419 Anxiety disorder, unspecified: Secondary | ICD-10-CM | POA: Insufficient documentation

## 2012-09-06 DIAGNOSIS — F411 Generalized anxiety disorder: Secondary | ICD-10-CM

## 2012-09-06 DIAGNOSIS — R079 Chest pain, unspecified: Secondary | ICD-10-CM | POA: Insufficient documentation

## 2012-09-06 DIAGNOSIS — I10 Essential (primary) hypertension: Secondary | ICD-10-CM

## 2012-09-06 DIAGNOSIS — F79 Unspecified intellectual disabilities: Secondary | ICD-10-CM

## 2012-09-06 DIAGNOSIS — K219 Gastro-esophageal reflux disease without esophagitis: Secondary | ICD-10-CM

## 2012-09-06 NOTE — Patient Instructions (Addendum)
Your physician wants you to follow-up in: 1 year. You will receive a reminder letter in the mail two months in advance. If you don't receive a letter, please call our office to schedule the follow-up appointment.  
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
## 2012-09-13 ENCOUNTER — Ambulatory Visit (INDEPENDENT_AMBULATORY_CARE_PROVIDER_SITE_OTHER): Payer: PRIVATE HEALTH INSURANCE | Admitting: Internal Medicine

## 2012-09-13 ENCOUNTER — Encounter (INDEPENDENT_AMBULATORY_CARE_PROVIDER_SITE_OTHER): Payer: Self-pay | Admitting: Internal Medicine

## 2012-09-13 VITALS — BP 120/42 | HR 72 | Temp 98.6°F | Ht 76.0 in | Wt 216.0 lb

## 2012-09-13 DIAGNOSIS — D126 Benign neoplasm of colon, unspecified: Secondary | ICD-10-CM | POA: Insufficient documentation

## 2012-09-13 DIAGNOSIS — Z800 Family history of malignant neoplasm of digestive organs: Secondary | ICD-10-CM | POA: Insufficient documentation

## 2012-09-13 DIAGNOSIS — D696 Thrombocytopenia, unspecified: Secondary | ICD-10-CM

## 2012-09-13 LAB — CBC WITH DIFFERENTIAL/PLATELET
Hemoglobin: 13.9 g/dL (ref 13.0–17.0)
Lymphs Abs: 1.6 10*3/uL (ref 0.7–4.0)
Monocytes Relative: 7 % (ref 3–12)
Neutro Abs: 4.8 10*3/uL (ref 1.7–7.7)
Neutrophils Relative %: 69 % (ref 43–77)
Platelets: 265 10*3/uL (ref 150–400)
RBC: 4.56 MIL/uL (ref 4.22–5.81)
WBC: 7 10*3/uL (ref 4.0–10.5)

## 2012-09-13 NOTE — Patient Instructions (Addendum)
OV in 6 months. 
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
## 2012-09-14 ENCOUNTER — Encounter (INDEPENDENT_AMBULATORY_CARE_PROVIDER_SITE_OTHER): Payer: Self-pay | Admitting: *Deleted

## 2012-09-14 ENCOUNTER — Other Ambulatory Visit (INDEPENDENT_AMBULATORY_CARE_PROVIDER_SITE_OTHER): Payer: Self-pay | Admitting: *Deleted

## 2012-09-14 DIAGNOSIS — Z86010 Personal history of colonic polyps: Secondary | ICD-10-CM

## 2012-09-20 ENCOUNTER — Telehealth (INDEPENDENT_AMBULATORY_CARE_PROVIDER_SITE_OTHER): Payer: Self-pay | Admitting: Internal Medicine

## 2012-09-20 NOTE — Telephone Encounter (Signed)
No answer at home #

## 2012-09-21 ENCOUNTER — Ambulatory Visit (HOSPITAL_COMMUNITY)
Admission: RE | Admit: 2012-09-21 | Discharge: 2012-09-21 | Disposition: A | Discharge status: Home / Self Care | Payer: PRIVATE HEALTH INSURANCE | Source: Ambulatory Visit | Attending: Internal Medicine | Admitting: Internal Medicine

## 2012-09-21 ENCOUNTER — Encounter (HOSPITAL_COMMUNITY)
Admission: RE | Disposition: A | Discharge status: Home / Self Care | Payer: Self-pay | Source: Ambulatory Visit | Attending: Internal Medicine

## 2012-09-21 ENCOUNTER — Encounter (HOSPITAL_COMMUNITY): Payer: Self-pay | Admitting: *Deleted

## 2012-09-21 DIAGNOSIS — Z860100 Personal history of colon polyps, unspecified: Secondary | ICD-10-CM | POA: Insufficient documentation

## 2012-09-21 DIAGNOSIS — K5730 Diverticulosis of large intestine without perforation or abscess without bleeding: Secondary | ICD-10-CM

## 2012-09-21 DIAGNOSIS — D126 Benign neoplasm of colon, unspecified: Secondary | ICD-10-CM | POA: Insufficient documentation

## 2012-09-21 DIAGNOSIS — Z800 Family history of malignant neoplasm of digestive organs: Secondary | ICD-10-CM

## 2012-09-21 DIAGNOSIS — K644 Residual hemorrhoidal skin tags: Secondary | ICD-10-CM

## 2012-09-21 DIAGNOSIS — K648 Other hemorrhoids: Secondary | ICD-10-CM | POA: Insufficient documentation

## 2012-09-21 DIAGNOSIS — I10 Essential (primary) hypertension: Secondary | ICD-10-CM | POA: Insufficient documentation

## 2012-09-21 HISTORY — DX: Polyp of colon: K63.5

## 2012-09-21 HISTORY — PX: COLONOSCOPY: SHX5424

## 2012-09-21 SURGERY — COLONOSCOPY
Anesthesia: Moderate Sedation

## 2012-09-21 MED ORDER — MEPERIDINE HCL 50 MG/ML IJ SOLN
INTRAMUSCULAR | Status: AC
  Filled 2012-09-21: qty 1

## 2012-09-21 MED ORDER — MIDAZOLAM HCL 5 MG/5ML IJ SOLN
INTRAMUSCULAR | Status: AC
  Filled 2012-09-21: qty 10

## 2012-09-21 MED ORDER — MIDAZOLAM HCL 5 MG/5ML IJ SOLN
INTRAMUSCULAR | Status: DC | PRN
  Administered 2012-09-21 (×3): 2 mg via INTRAVENOUS

## 2012-09-21 MED ORDER — SODIUM CHLORIDE 0.9 % IV SOLN
INTRAVENOUS | Status: DC
  Administered 2012-09-21: 07:00:00 via INTRAVENOUS

## 2012-09-21 MED ORDER — MEPERIDINE HCL 50 MG/ML IJ SOLN
INTRAMUSCULAR | Status: DC | PRN
  Administered 2012-09-21 (×2): 25 mg via INTRAVENOUS

## 2012-09-21 MED ORDER — STERILE WATER FOR IRRIGATION IR SOLN
Status: DC | PRN
  Administered 2012-09-21: 08:00:00

## 2012-09-21 NOTE — H&P (Signed)
[name] is an 62 y.o. male.   Chief Complaint: Patient is here for colonoscopy. HPI: Patient is 62 year old Caucasian male with history of colonic polyps and is here for surveillance colonoscopy. He denies abdominal pain rectal bleeding or change in his bowel habits. He has several year history of intermittent nausea and vomiting felt to be secondary to stress. Family history is positive for colon carcinoma in his mother and one brother.  Past Medical History  Diagnosis Date  . Hypertension   . Nausea   . Gout   . MET TEST 08/15/2011    moderate peak VO2 limitation at 66% predicted; mod cardiac impairment with low SV & low anaerobic threshold, mild chronotropic incompetence; low risk  . History of echocardiogram 09/14/2011    EF >55%; borderline concentric LVH;   . History of stress test 06/21/2010    exercise; normal study  . Colon polyps     Past Surgical History  Procedure Laterality Date  . Hernia repair      x 2  . Nasal polyp surgery       2-3 months ago  . Cholecystectomy    . Hip surgery      Rt hip age 32    Family History  Problem Relation Age of Onset  . Colon cancer Brother    Social History:  reports that he has never smoked. He has never used smokeless tobacco. He reports that he does not drink alcohol or use illicit drugs.  Allergies:  Allergies  Allergen Reactions  . Penicillins Other (See Comments)    Unknown  . Tetanus Toxoids Rash    Medications Prior to Admission  Medication Sig Dispense Refill  . acetaminophen (TYLENOL) 500 MG tablet Take 1,000 mg by mouth every 6 (six) hours as needed. Pain      . aspirin 81 MG tablet Take 81 mg by mouth daily.       . bacitracin ointment Apply 1 application topically 2 (two) times daily.      Marland Kitchen buPROPion (WELLBUTRIN XL) 150 MG 24 hr tablet Take 1 tablet by mouth daily.      . cholecalciferol (VITAMIN D) 400 UNITS TABS Take 2,000 Units by mouth daily.       . enalapril (VASOTEC) 10 MG tablet Take 10 mg by  mouth 2 (two) times daily.      . febuxostat (ULORIC) 40 MG tablet Take 80 mg by mouth daily as needed. Swelling of the feet.      . fexofenadine (ALLEGRA) 180 MG tablet Take 180 mg by mouth daily.      . fluticasone (FLONASE) 50 MCG/ACT nasal spray Place 2 sprays into the nose daily.      . isosorbide mononitrate (IMDUR) 30 MG 24 hr tablet Take 30 mg by mouth daily.      Marland Kitchen loperamide (IMODIUM) 2 MG capsule TAKE ONE CAPSULE FOUR TIMES DAILY AS NEEDED FOR DIARRHEA OR LOOSE STOOLS  120 capsule  2  . metoprolol tartrate (LOPRESSOR) 25 MG tablet Take 50 mg by mouth 2 (two) times daily.       Marland Kitchen omeprazole (PRILOSEC) 40 MG capsule Take 1 capsule (40 mg total) by mouth daily.  90 capsule  3  . sertraline (ZOLOFT) 50 MG tablet Take 50 mg by mouth daily.      Marland Kitchen SSD 1 % cream as needed.      . traMADol (ULTRAM) 50 MG tablet Take 50 mg by mouth every 6 (six) hours as needed.  No results found for this or any previous visit (from the past 48 hour(s)). No results found.  ROS  Blood pressure 156/65, pulse 90, temperature 98.3 F (36.8 C), temperature source Oral, resp. rate 17, height 6\' 4"  (1.93 m), weight 216 lb (97.977 kg), SpO2 98.00%. Physical Exam  Constitutional: He appears well-developed and well-nourished.  HENT:  Mouth/Throat: Oropharynx is clear and moist.  Eyes: Conjunctivae are normal. No scleral icterus.  Neck: No thyromegaly present.  Cardiovascular: Normal rate, regular rhythm and normal heart sounds.   No murmur heard. Respiratory: Effort normal and breath sounds normal.  GI: Soft. Bowel sounds are normal. He exhibits no distension and no mass. There is no tenderness.  Musculoskeletal: He exhibits no edema.  Lymphadenopathy:    He has no cervical adenopathy.  Neurological: He is alert.  Skin: Skin is warm and dry.     Assessment/Plan History of colonic polyps and family history of colon carcinoma. Surveillance colonoscopy.  Chanse Kagel U 09/21/2012, 7:29 AM

## 2012-09-21 NOTE — Op Note (Signed)
COLONOSCOPY PROCEDURE REPORT  PATIENT:  [name]  MR#:  [id number] Birthdate:  [birth year], 62 y.o., male Endoscopist:  Dr. Malissa Hippo, MD Referred By:  Dr. Madelin Rear. Sherwood Gambler, MD  Procedure Date: 09/21/2012  Procedure:   Colonoscopy.  Indications:  Patient is 62 year old Caucasian male who is here for surveillance colonoscopy. He has history of colonic adenomas and family history of colon carcinoma(mother and a brother).  Informed Consent:  The procedure and risks were reviewed with the patient and informed consent was obtained.  Medications:  Demerol 50 mg IV Versed 6 mg IV  Description of procedure:  After a digital rectal exam was performed, that colonoscope was advanced from the anus through the rectum and colon to the area of the cecum, ileocecal valve and appendiceal orifice. The cecum was deeply intubated. These structures were well-seen and photographed for the record. From the level of the cecum and ileocecal valve, the scope was slowly and cautiously withdrawn. The mucosal surfaces were carefully surveyed utilizing scope tip to flexion to facilitate fold flattening as needed. The scope was pulled down into the rectum where a thorough exam including retroflexion was performed.  Findings:   Prep satisfactory. Single cecal diverticulum. Two small polyps ablated via cold biopsy and submitted together. These are located at splenic flexure and descending colon. Normal rectal mucosa. Small hemorrhoids above and below the dentate line.   Therapeutic/Diagnostic Maneuvers Performed:  See above  Complications:  None  Cecal Withdrawal Time:  18 minutes  Impression:  Examination performed to cecum. Single cecal diverticulum. Two small polyps ablated via cold biopsy and submitted together(splenic flexure and descending colon). Internal/external hemorrhoids.  Recommendations:  Standard instructions given. I will contact patient with biopsy results. Next colonoscopy in 5  years.  Natassja Ollis U  09/21/2012 8:08 AM  CC: Dr. Cassell Smiles., MD & Dr. Bonnetta Barry ref. provider found

## 2012-09-25 ENCOUNTER — Encounter (HOSPITAL_COMMUNITY): Payer: Self-pay | Admitting: Internal Medicine

## 2012-10-01 ENCOUNTER — Encounter (INDEPENDENT_AMBULATORY_CARE_PROVIDER_SITE_OTHER): Payer: Self-pay | Admitting: *Deleted

## 2012-12-11 ENCOUNTER — Other Ambulatory Visit: Payer: Self-pay | Admitting: Internal Medicine

## 2012-12-11 NOTE — Telephone Encounter (Signed)
Rx was sent to pharmacy electronically. 

## 2012-12-20 ENCOUNTER — Other Ambulatory Visit: Payer: Self-pay

## 2013-02-09 ENCOUNTER — Other Ambulatory Visit (INDEPENDENT_AMBULATORY_CARE_PROVIDER_SITE_OTHER): Payer: Self-pay | Admitting: Internal Medicine

## 2013-02-27 ENCOUNTER — Encounter (INDEPENDENT_AMBULATORY_CARE_PROVIDER_SITE_OTHER): Payer: Self-pay | Admitting: *Deleted

## 2013-03-04 ENCOUNTER — Encounter (INDEPENDENT_AMBULATORY_CARE_PROVIDER_SITE_OTHER): Payer: Self-pay | Admitting: *Deleted

## 2013-03-12 ENCOUNTER — Other Ambulatory Visit (INDEPENDENT_AMBULATORY_CARE_PROVIDER_SITE_OTHER): Payer: Self-pay | Admitting: Internal Medicine

## 2013-05-07 ENCOUNTER — Ambulatory Visit (INDEPENDENT_AMBULATORY_CARE_PROVIDER_SITE_OTHER): Payer: PRIVATE HEALTH INSURANCE | Admitting: Internal Medicine

## 2013-05-14 ENCOUNTER — Encounter (INDEPENDENT_AMBULATORY_CARE_PROVIDER_SITE_OTHER): Payer: Self-pay | Admitting: Internal Medicine

## 2013-05-14 ENCOUNTER — Ambulatory Visit (INDEPENDENT_AMBULATORY_CARE_PROVIDER_SITE_OTHER): Payer: PRIVATE HEALTH INSURANCE | Admitting: Internal Medicine

## 2013-05-14 VITALS — BP 114/56 | HR 60 | Temp 97.6°F | Ht 75.0 in | Wt 230.0 lb

## 2013-05-14 DIAGNOSIS — R110 Nausea: Secondary | ICD-10-CM

## 2013-05-14 DIAGNOSIS — R7989 Other specified abnormal findings of blood chemistry: Secondary | ICD-10-CM | POA: Insufficient documentation

## 2013-05-14 DIAGNOSIS — Z86010 Personal history of colonic polyps: Secondary | ICD-10-CM

## 2013-05-14 DIAGNOSIS — D696 Thrombocytopenia, unspecified: Secondary | ICD-10-CM | POA: Insufficient documentation

## 2013-05-14 LAB — CBC WITH DIFFERENTIAL/PLATELET
BASOS ABS: 0.1 10*3/uL (ref 0.0–0.1)
BASOS PCT: 1 % (ref 0–1)
EOS ABS: 0.1 10*3/uL (ref 0.0–0.7)
Eosinophils Relative: 1 % (ref 0–5)
HCT: 43 % (ref 39.0–52.0)
Hemoglobin: 14.8 g/dL (ref 13.0–17.0)
Lymphocytes Relative: 21 % (ref 12–46)
Lymphs Abs: 1.3 10*3/uL (ref 0.7–4.0)
MCH: 29.9 pg (ref 26.0–34.0)
MCHC: 34.4 g/dL (ref 30.0–36.0)
MCV: 86.9 fL (ref 78.0–100.0)
Monocytes Absolute: 0.7 10*3/uL (ref 0.1–1.0)
Monocytes Relative: 11 % (ref 3–12)
NEUTROS PCT: 66 % (ref 43–77)
Neutro Abs: 4.2 10*3/uL (ref 1.7–7.7)
PLATELETS: 234 10*3/uL (ref 150–400)
RBC: 4.95 MIL/uL (ref 4.22–5.81)
RDW: 14 % (ref 11.5–15.5)
WBC: 6.3 10*3/uL (ref 4.0–10.5)

## 2013-05-14 LAB — HEPATIC FUNCTION PANEL
ALBUMIN: 4.4 g/dL (ref 3.5–5.2)
ALT: 16 U/L (ref 0–53)
AST: 19 U/L (ref 0–37)
Alkaline Phosphatase: 56 U/L (ref 39–117)
BILIRUBIN TOTAL: 1.2 mg/dL (ref 0.2–1.2)
Bilirubin, Direct: 0.2 mg/dL (ref 0.0–0.3)
Indirect Bilirubin: 1 mg/dL (ref 0.2–1.2)
TOTAL PROTEIN: 7.4 g/dL (ref 6.0–8.3)

## 2013-05-14 NOTE — Progress Notes (Signed)
Subjective:     Patient ID: [name], male   DOB: [birth year], 63 y.o.   MRN: [id number]  HPI Here today for f/u. Hx of thrombocytopenia. Last platelet was normal. Last seen in July of 2014.  Hx of chronic nausea and vomiting.  He tells me he is good. His appetite is good. He has gained 14 pounds since his last visit. He cooks his own meals. He lives alone. No abdominal pain. He occasionally has diarrhea and takes Imodium on a prn basis.  He says he has diarrhea about once a week. Occasionally has indigestion.  In the morning, he occasionally has some nausea.  He usually has a BM every day. Most stools are solids. No change in caliber. He says he saw some blood on the toilet tissue and his stool last month x  Incidence.     09/21/2012 Colonoscopy: Hx of colonic adenomas and family hx of colon cancer in mother and brother.  Impression:  Examination performed to cecum.  Single cecal diverticulum.  Two small polyps ablated via cold biopsy and submitted together(splenic flexure and descending colon).  Internal/external hemorrhoids.  Biopsy: Both polyps are tubular adenomas.   CBC    Component Value Date/Time   WBC 7.0 09/13/2012 1104   RBC 4.56 09/13/2012 1104   HGB 13.9 09/13/2012 1104   HCT 40.2 09/13/2012 1104   PLT 265 09/13/2012 1104   MCV 88.2 09/13/2012 1104   MCH 30.5 09/13/2012 1104   MCHC 34.6 09/13/2012 1104   RDW 13.8 09/13/2012 1104   LYMPHSABS 1.6 09/13/2012 1104   MONOABS 0.5 09/13/2012 1104   EOSABS 0.1 09/13/2012 1104   BASOSABS 0.0 09/13/2012 1104      Review of Systems Past Medical History  Diagnosis Date  . Hypertension   . Nausea   . Gout   . MET TEST 08/15/2011    moderate peak VO2 limitation at 66% predicted; mod cardiac impairment with low SV & low anaerobic threshold, mild chronotropic incompetence; low risk  . History of echocardiogram 09/14/2011    EF >55%; borderline concentric LVH;   . History of stress test 06/21/2010    exercise; normal study  . Colon  polyps   . High cholesterol     Past Surgical History  Procedure Laterality Date  . Hernia repair      x 2  . Nasal polyp surgery       2-3 months ago  . Cholecystectomy    . Hip surgery      Rt hip age 58  . Colonoscopy N/A 09/21/2012    Procedure: COLONOSCOPY;  Surgeon: Rogene Houston, MD;  Location: [facility];  Service: Endoscopy;  Laterality: N/A;  730    Allergies  Allergen Reactions  . Penicillins Other (See Comments)    Unknown  . Tetanus Toxoids Rash    Current Outpatient Prescriptions on File Prior to Visit  Medication Sig Dispense Refill  . acetaminophen (TYLENOL) 500 MG tablet Take 1,000 mg by mouth every 6 (six) hours as needed. Pain      . aspirin 81 MG tablet Take 81 mg by mouth daily.       Marland Kitchen buPROPion (WELLBUTRIN XL) 150 MG 24 hr tablet Take 1 tablet by mouth daily.      . cholecalciferol (VITAMIN D) 400 UNITS TABS Take 2,000 Units by mouth daily.       . enalapril (VASOTEC) 10 MG tablet Take 10 mg by mouth 2 (two) times daily.      Marland Kitchen  febuxostat (ULORIC) 40 MG tablet Take 80 mg by mouth daily as needed. Swelling of the feet.      . fexofenadine (ALLEGRA) 180 MG tablet Take 180 mg by mouth daily.      . isosorbide mononitrate (IMDUR) 30 MG 24 hr tablet TAKE ONE TABLET BY MOUTH DAILY  30 tablet  6  . loperamide (IMODIUM) 2 MG capsule TAKE ONE CAPSULE BY MOUTH FOUR TIMES DAILY AS NEEDED FOR DIARRHEA OR LOOSE STOOLS  120 capsule  2  . metoprolol tartrate (LOPRESSOR) 25 MG tablet Take 50 mg by mouth 2 (two) times daily.       Marland Kitchen omeprazole (PRILOSEC) 40 MG capsule TAKE ONE CAPSULE BY MOUTH ONCE DAILY  90 capsule  3  . sertraline (ZOLOFT) 50 MG tablet Take 50 mg by mouth daily.      . bacitracin ointment Apply 1 application topically 2 (two) times daily.      . fluticasone (FLONASE) 50 MCG/ACT nasal spray Place 2 sprays into the nose daily.      Marland Kitchen SSD 1 % cream as needed.      . traMADol (ULTRAM) 50 MG tablet Take 50 mg by mouth every 6 (six) hours as needed.        No current facility-administered medications on file prior to visit.         Objective:   Physical Exam  Filed Vitals:   05/14/13 1016  BP: 114/56  Pulse: 60  Temp: 97.6 F (36.4 C)  Height: 6' 3"  (1.905 m)  Weight: 230 lb (104.327 kg)   Alert and oriented. Skin warm and dry. Oral mucosa is moist.   . Sclera anicteric, conjunctivae is pink. Thyroid not enlarged. No cervical lymphadenopathy. Lungs clear. Heart regular rate and rhythm.  Abdomen is soft. Bowel sounds are positive. No hepatomegaly. No abdominal masses felt. No tenderness.  No edema to lower extremities.       Assessment:    Nausea ana vomiting: occasionally has nausea in the morning. He has gained 14 pounds since his last visit in July. Hx of thrombocytopenia: Last platelet ct in 2014 was normal.   hx of colon polyps. Next colonoscopy in 2019 Plan:    OV in 1 yr. CBC and hepatic profile today.

## 2013-05-14 NOTE — Patient Instructions (Signed)
OV in 1 year. CBC and Hepatic function today.

## 2013-06-11 ENCOUNTER — Other Ambulatory Visit (INDEPENDENT_AMBULATORY_CARE_PROVIDER_SITE_OTHER): Payer: Self-pay | Admitting: Internal Medicine

## 2013-06-12 NOTE — Telephone Encounter (Signed)
Per Dr.Rehman may fill , no additional refills.

## 2013-07-11 ENCOUNTER — Other Ambulatory Visit: Payer: Self-pay | Admitting: Internal Medicine

## 2013-07-11 NOTE — Telephone Encounter (Signed)
Rx refill sent to patient pharmacy   

## 2013-07-25 ENCOUNTER — Other Ambulatory Visit (INDEPENDENT_AMBULATORY_CARE_PROVIDER_SITE_OTHER): Payer: Self-pay | Admitting: Internal Medicine

## 2013-07-25 NOTE — Telephone Encounter (Signed)
Per Dr.Rehman may fill with 5 refills 

## 2013-11-08 ENCOUNTER — Other Ambulatory Visit: Payer: Self-pay | Admitting: Internal Medicine

## 2013-11-08 NOTE — Telephone Encounter (Signed)
Rx was sent to pharmacy electronically. Patient needs appointment for future refills. Second warning given.

## 2013-12-16 ENCOUNTER — Other Ambulatory Visit: Payer: Self-pay | Admitting: *Deleted

## 2013-12-16 MED ORDER — ISOSORBIDE MONONITRATE ER 30 MG PO TB24: 30.0000 mg | ORAL_TABLET | Freq: Every day | ORAL | Status: DC

## 2013-12-16 NOTE — Telephone Encounter (Signed)
Refill based on the appointment scheduled for 01/08/2014 with a notation included as MUST KEEP APPOINTMENT FOR FUTURE REFILLS.

## 2014-01-08 ENCOUNTER — Encounter: Payer: Self-pay | Admitting: Internal Medicine

## 2014-01-08 ENCOUNTER — Ambulatory Visit (INDEPENDENT_AMBULATORY_CARE_PROVIDER_SITE_OTHER): Payer: PRIVATE HEALTH INSURANCE | Admitting: Internal Medicine

## 2014-01-08 VITALS — BP 112/58 | HR 79 | Ht 77.0 in | Wt 214.9 lb

## 2014-01-08 DIAGNOSIS — F79 Unspecified intellectual disabilities: Secondary | ICD-10-CM

## 2014-01-08 DIAGNOSIS — I10 Essential (primary) hypertension: Secondary | ICD-10-CM

## 2014-01-08 DIAGNOSIS — R0789 Other chest pain: Secondary | ICD-10-CM

## 2014-01-08 MED ORDER — ISOSORBIDE MONONITRATE ER 30 MG PO TB24: 30.0000 mg | ORAL_TABLET | Freq: Every day | ORAL | Status: DC

## 2014-01-08 NOTE — Patient Instructions (Signed)
Your physician wants you to follow-up in: 1 year with Dr. [name]. You will receive a reminder letter in the mail two months in advance. If you don't receive a letter, please call our office to schedule the follow-up appointment.  
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
## 2014-01-20 IMAGING — CR DG ANKLE COMPLETE 3+V*L*
2 series · 2 of 2 positions shown · non-contrast
Comparison: 05/18/2005

CLINICAL DATA: Posterior left foot and ankle pain, sprain

LEFT ANKLE COMPLETE - 3+ VIEW

[view not recorded (1 of 2)]
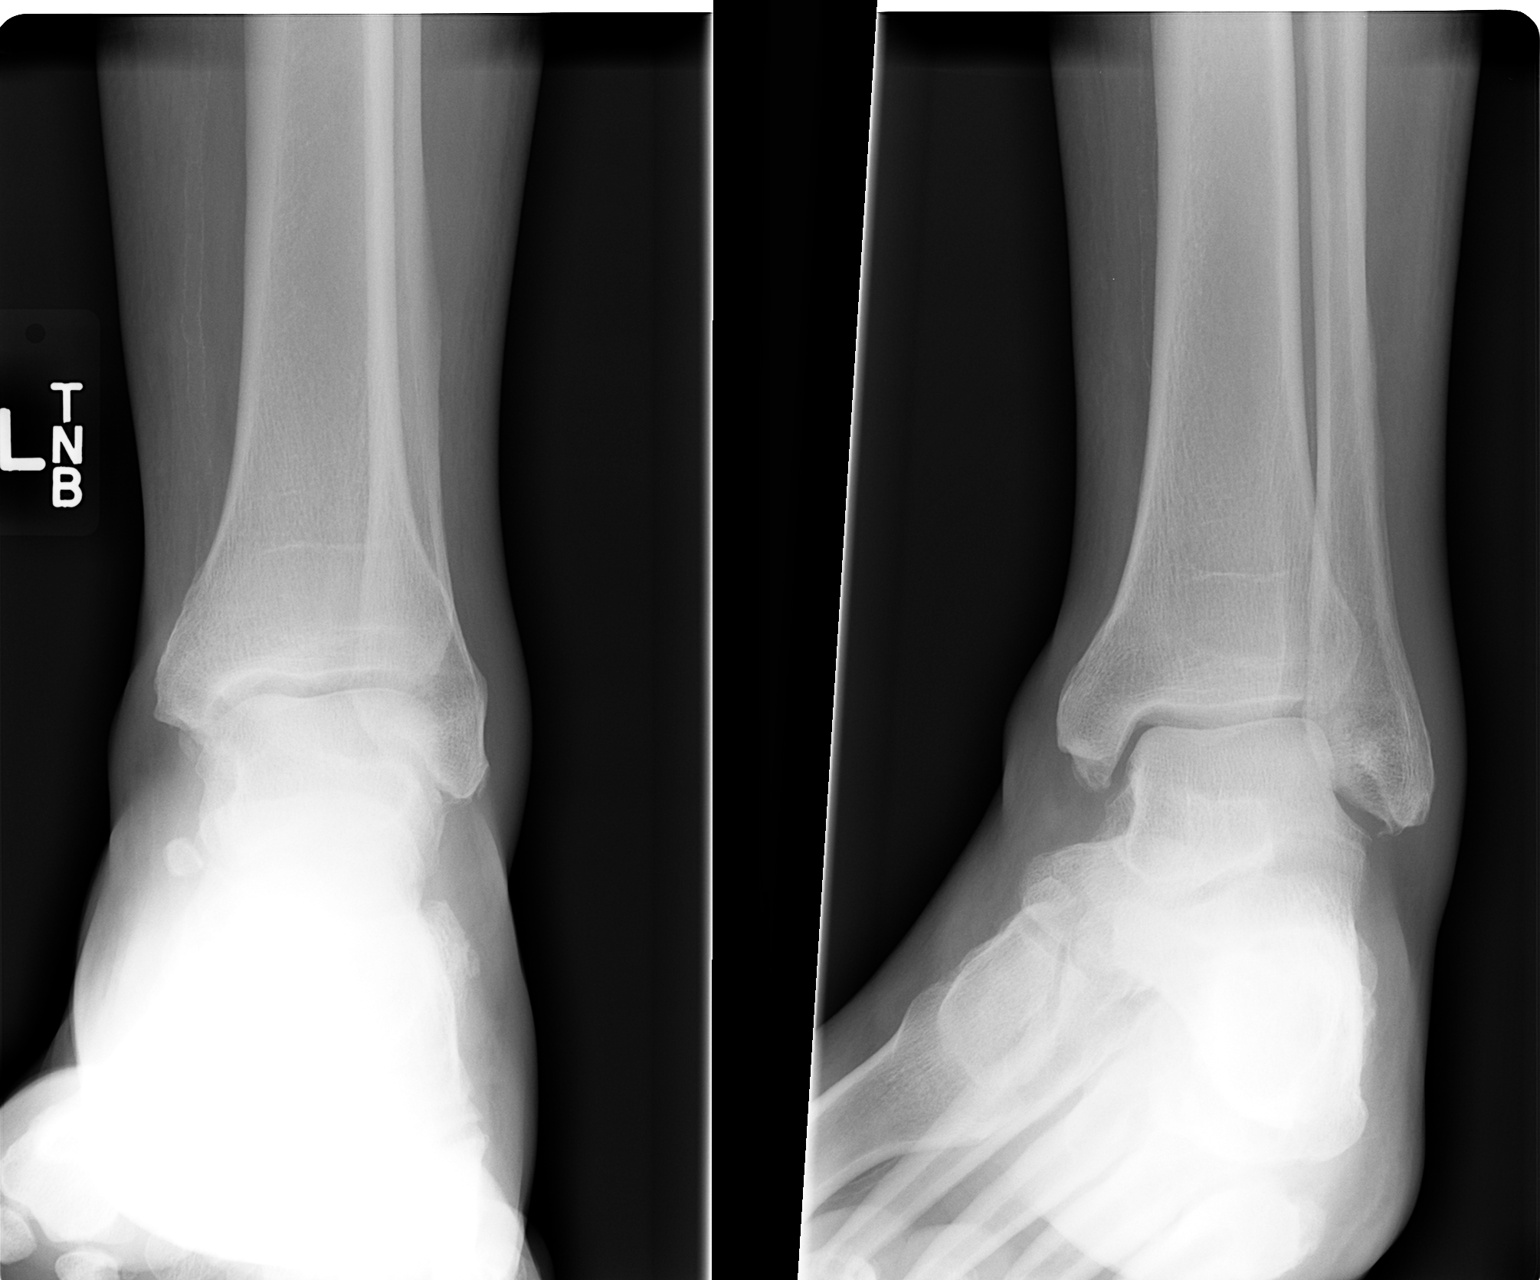

[view not recorded (2 of 2)]
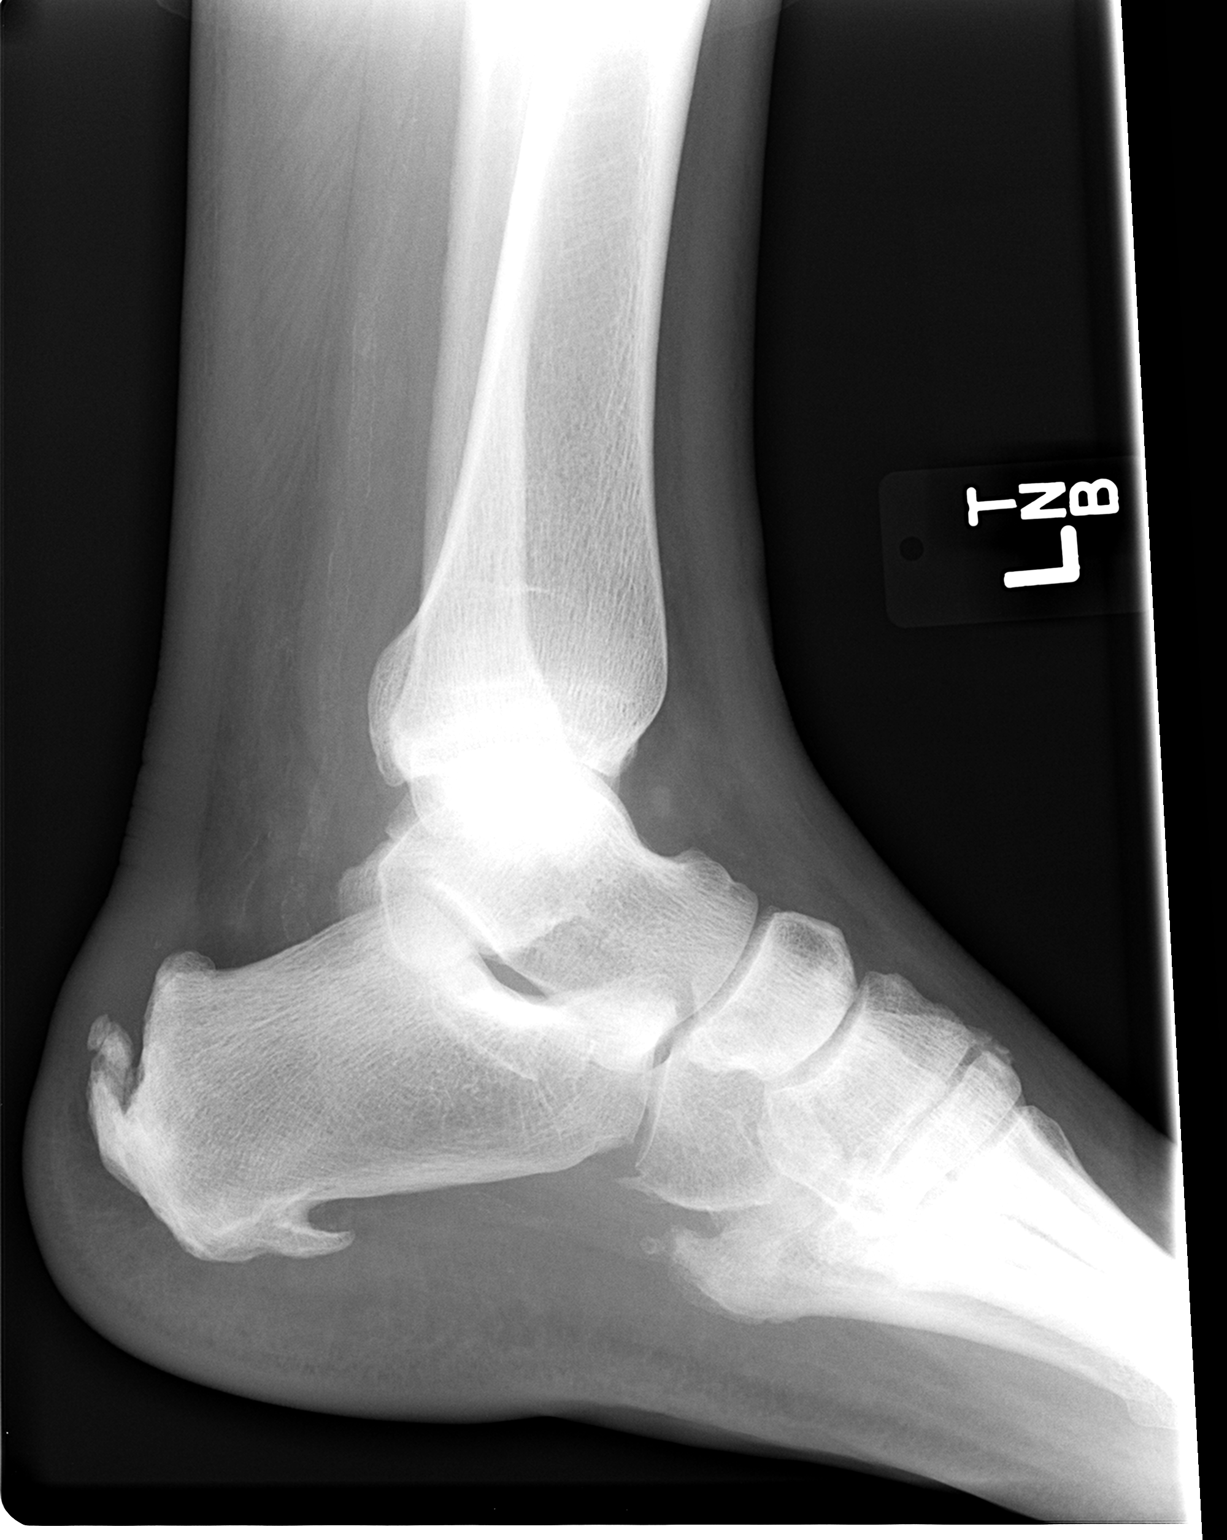

[2 of 2 positions shown; findings below may reference images not displayed]

FINDINGS: Ankle mortise intact.
Tiny spur at the tip of lateral malleolus.
Osseous mineralization normal.
Nonfused accessory ossification center at the medial margin of
tarsal navicular.
Bulky plantar and Achilles insertion calcaneal spurs.
Question spurring versus enthesopathy at base of fifth metatarsal.
No acute fracture, dislocation, or bone destruction.
Scattered small vessel vascular calcification peri
IMPRESSION: Calcaneal spurring with spur formation versus enthesopathy at base
of fifth metatarsal.
No additional focal bony abnormalities identified.

## 2014-01-20 IMAGING — CR DG FOOT COMPLETE 3+V*L*
3 series · 3 of 3 positions shown · non-contrast
Comparison: Left ankle radiographs 05/18/2005

CLINICAL DATA: Posterior left foot ankle pain, sprain

LEFT FOOT - COMPLETE 3+ VIEW

[view not recorded (1 of 3)]
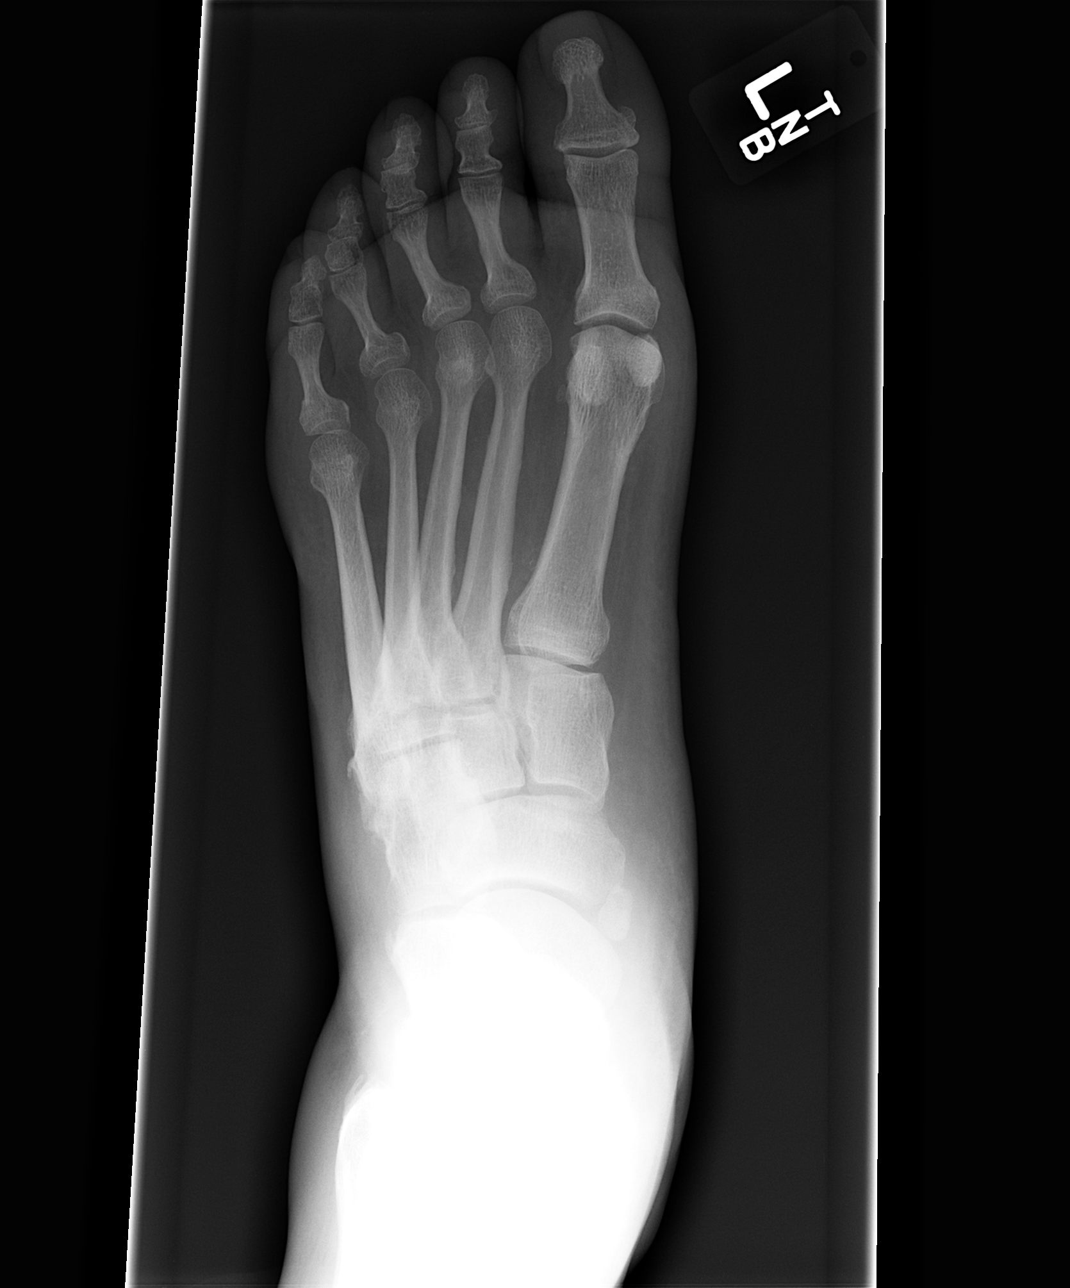

[view not recorded (2 of 3)]
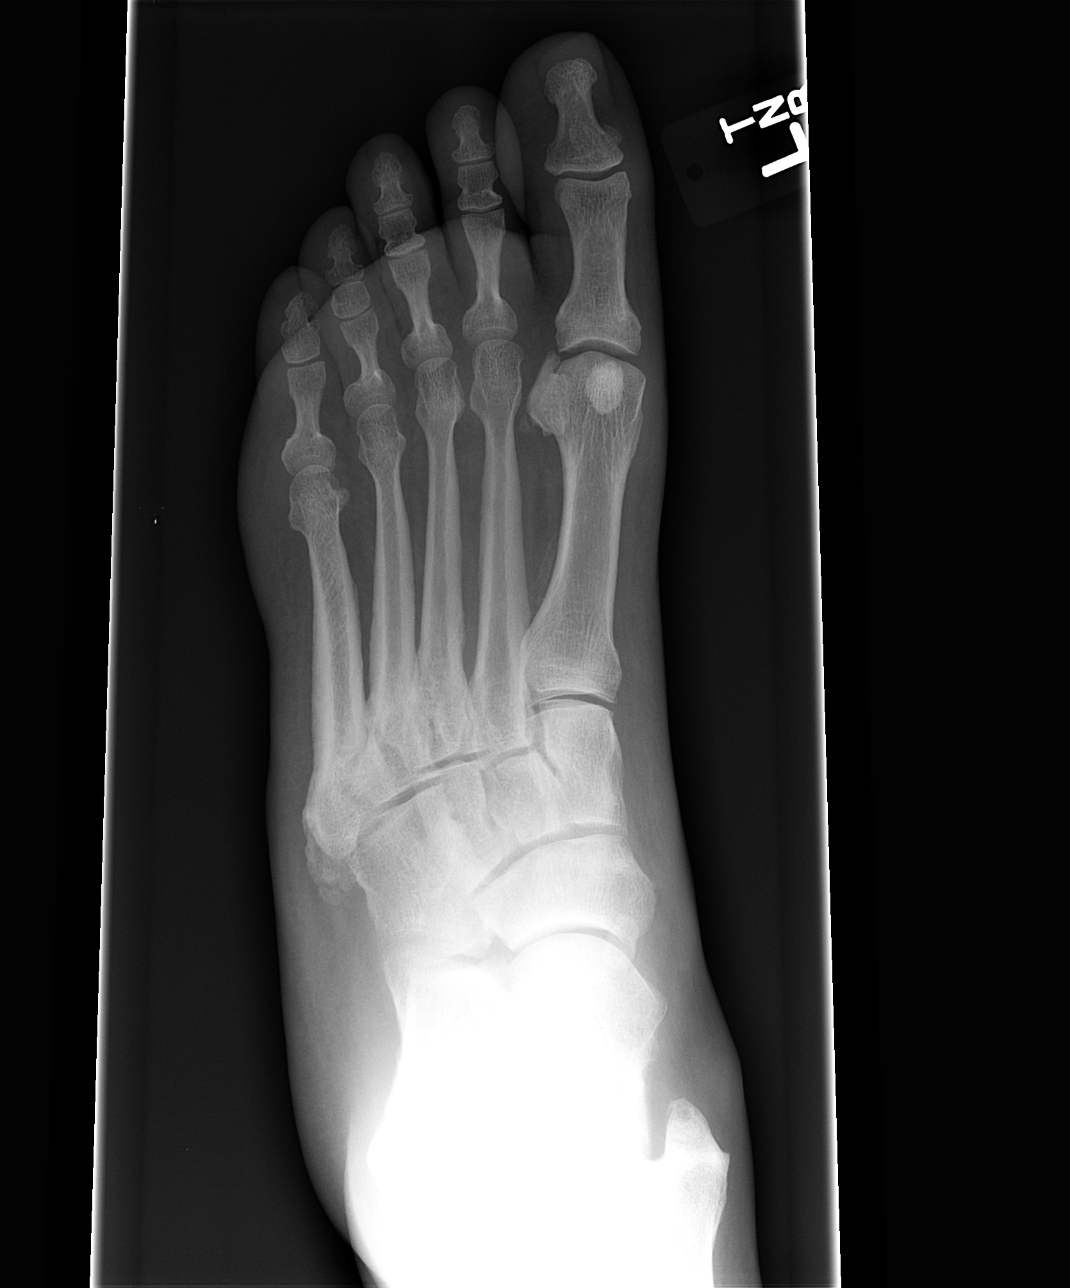

[view not recorded (3 of 3)]
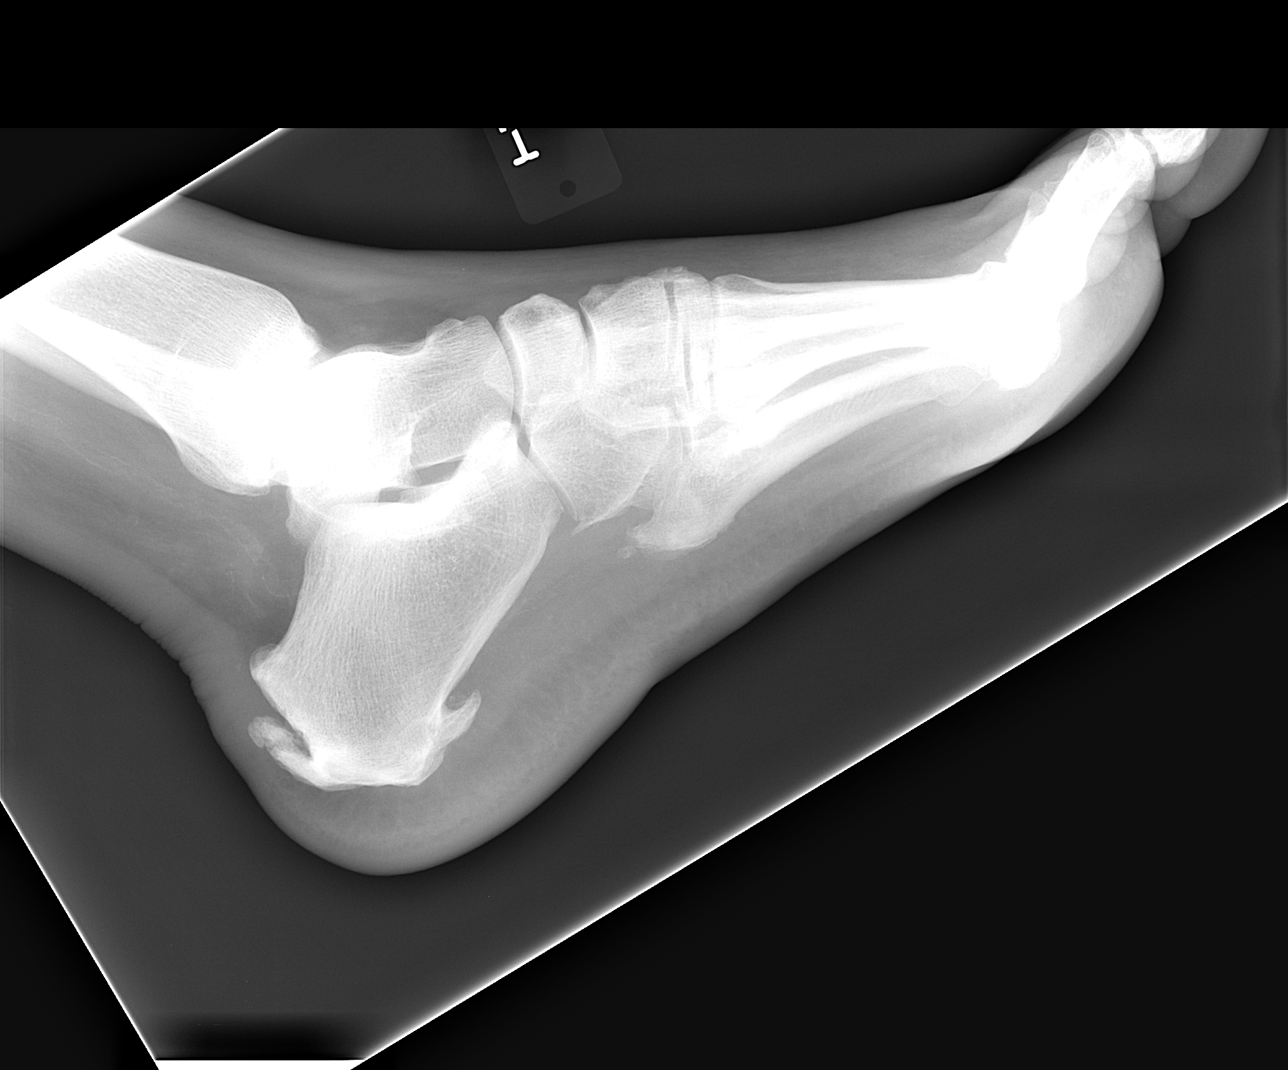

[3 of 3 positions shown; findings below may reference images not displayed]

FINDINGS: Osseous mineralization normal.
Joint spaces preserved.
Nonfused accessory ossicle at medial margin of the tarsal
navicular.
Enthesopathy versus spurring at base of fifth metatarsal.
Extensive calcaneal spurring again seen.
No acute fracture, dislocation, or bone destruction.
Scattered small vessel vascular calcifications.
IMPRESSION: Extensive calcaneal spurring.
Question enthesopathy versus spurring at base of fifth metatarsal.
No definite acute bony abnormalities.
If patient has persistent symptoms consider MR imaging follow-up.

## 2014-02-05 ENCOUNTER — Encounter (INDEPENDENT_AMBULATORY_CARE_PROVIDER_SITE_OTHER): Payer: Self-pay | Admitting: *Deleted

## 2014-02-28 ENCOUNTER — Other Ambulatory Visit (INDEPENDENT_AMBULATORY_CARE_PROVIDER_SITE_OTHER): Payer: Self-pay | Admitting: Internal Medicine

## 2014-03-11 ENCOUNTER — Other Ambulatory Visit (INDEPENDENT_AMBULATORY_CARE_PROVIDER_SITE_OTHER): Payer: Self-pay | Admitting: Internal Medicine

## 2014-03-19 DIAGNOSIS — R1031 Right lower quadrant pain: Secondary | ICD-10-CM | POA: Diagnosis not present

## 2014-03-19 DIAGNOSIS — Z6826 Body mass index (BMI) 26.0-26.9, adult: Secondary | ICD-10-CM | POA: Diagnosis not present

## 2014-03-19 DIAGNOSIS — E663 Overweight: Secondary | ICD-10-CM | POA: Diagnosis not present

## 2014-04-10 DIAGNOSIS — M109 Gout, unspecified: Secondary | ICD-10-CM | POA: Diagnosis not present

## 2014-04-10 DIAGNOSIS — N183 Chronic kidney disease, stage 3 (moderate): Secondary | ICD-10-CM | POA: Diagnosis not present

## 2014-04-10 DIAGNOSIS — I129 Hypertensive chronic kidney disease with stage 1 through stage 4 chronic kidney disease, or unspecified chronic kidney disease: Secondary | ICD-10-CM | POA: Diagnosis not present

## 2014-04-10 DIAGNOSIS — E559 Vitamin D deficiency, unspecified: Secondary | ICD-10-CM | POA: Diagnosis not present

## 2014-04-10 DIAGNOSIS — Z79899 Other long term (current) drug therapy: Secondary | ICD-10-CM | POA: Diagnosis not present

## 2014-04-10 DIAGNOSIS — D649 Anemia, unspecified: Secondary | ICD-10-CM | POA: Diagnosis not present

## 2014-04-10 DIAGNOSIS — R809 Proteinuria, unspecified: Secondary | ICD-10-CM | POA: Diagnosis not present

## 2014-04-15 DIAGNOSIS — R809 Proteinuria, unspecified: Secondary | ICD-10-CM | POA: Diagnosis not present

## 2014-04-15 DIAGNOSIS — N183 Chronic kidney disease, stage 3 (moderate): Secondary | ICD-10-CM | POA: Diagnosis not present

## 2014-04-15 DIAGNOSIS — M109 Gout, unspecified: Secondary | ICD-10-CM | POA: Diagnosis not present

## 2014-04-15 DIAGNOSIS — I10 Essential (primary) hypertension: Secondary | ICD-10-CM | POA: Diagnosis not present

## 2014-04-15 DIAGNOSIS — E559 Vitamin D deficiency, unspecified: Secondary | ICD-10-CM | POA: Diagnosis not present

## 2014-06-03 ENCOUNTER — Ambulatory Visit (INDEPENDENT_AMBULATORY_CARE_PROVIDER_SITE_OTHER): Payer: PRIVATE HEALTH INSURANCE | Admitting: Internal Medicine

## 2014-06-11 ENCOUNTER — Encounter (INDEPENDENT_AMBULATORY_CARE_PROVIDER_SITE_OTHER): Payer: Self-pay | Admitting: Internal Medicine

## 2014-06-11 ENCOUNTER — Ambulatory Visit (INDEPENDENT_AMBULATORY_CARE_PROVIDER_SITE_OTHER): Payer: Medicare Other | Admitting: Internal Medicine

## 2014-06-11 VITALS — BP 130/80 | HR 72 | Temp 98.0°F | Ht 75.0 in | Wt 234.0 lb

## 2014-06-11 DIAGNOSIS — Z87898 Personal history of other specified conditions: Secondary | ICD-10-CM

## 2014-06-11 DIAGNOSIS — D696 Thrombocytopenia, unspecified: Secondary | ICD-10-CM | POA: Diagnosis not present

## 2014-06-11 DIAGNOSIS — Z8719 Personal history of other diseases of the digestive system: Secondary | ICD-10-CM | POA: Diagnosis not present

## 2014-06-11 LAB — CBC
HEMATOCRIT: 42.4 % (ref 39.0–52.0)
HEMOGLOBIN: 14.6 g/dL (ref 13.0–17.0)
MCH: 30.2 pg (ref 26.0–34.0)
MCHC: 34.4 g/dL (ref 30.0–36.0)
MCV: 87.8 fL (ref 78.0–100.0)
MPV: 11.1 fL (ref 8.6–12.4)
Platelets: 230 10*3/uL (ref 150–400)
RBC: 4.83 MIL/uL (ref 4.22–5.81)
RDW: 14.1 % (ref 11.5–15.5)
WBC: 7.8 10*3/uL (ref 4.0–10.5)

## 2014-06-11 NOTE — Progress Notes (Addendum)
Subjective:    Patient ID: [name], male    DOB: 1951/01/16, 64 y.o.   MRN: [id number]  HPI Here today for f/u. Hx of thrombocytopenia. Platelets have been normal since 2013. Last seen in March of 2015 by me.  Hx of chronic nausea and vomiting. He tells me he is doing good. He lives by himself and has a friend to cook for him. Appetite is good. Thee has been no weight loss. He has actually gained 4 pounds since his last visit in March of 2015. He has a BM usually every day. No melena or BRRB. No abdominal pain. He does have some drainage and he vomits in th morning. He walks daily about a 1/2 -mile daily.     CBC    Component Value Date/Time   WBC 6.3 05/14/2013 1041   RBC 4.95 05/14/2013 1041   HGB 14.8 05/14/2013 1041   HCT 43.0 05/14/2013 1041   PLT 234 05/14/2013 1041   MCV 86.9 05/14/2013 1041   MCH 29.9 05/14/2013 1041   MCHC 34.4 05/14/2013 1041   RDW 14.0 05/14/2013 1041   LYMPHSABS 1.3 05/14/2013 1041   MONOABS 0.7 05/14/2013 1041   EOSABS 0.1 05/14/2013 1041   BASOSABS 0.1 05/14/2013 1041    CMP Latest Ref Rng 05/14/2013 08/28/2011 07/20/2010  Glucose 70 - 99 mg/dL - 205(H) 107(H)  BUN 6 - 23 mg/dL - 17 14  Creatinine 0.50 - 1.35 mg/dL - 1.71(H) 1.50  Sodium 135 - 145 mEq/L - 133(L) 141  Potassium 3.5 - 5.1 mEq/L - 3.6 4.2  Chloride 96 - 112 mEq/L - 99 105  CO2 19 - 32 mEq/L - 25 28  Calcium 8.4 - 10.5 mg/dL - 9.2 9.5  Total Protein 6.0 - 8.3 g/dL 7.4 7.1 6.5  Total Bilirubin 0.2 - 1.2 mg/dL 1.2 1.1 0.4  Alkaline Phos 39 - 117 U/L 56 51 68  AST 0 - 37 U/L _0 ALT 0 - 53 U/L _1 09/21/2012 Colonoscopy: Hx of colonic adenomas and family hx of colon cancer in mother and brother.  Impression:  Examination performed to cecum.  Single cecal diverticulum.  Two small polyps ablated via cold biopsy and submitted together(splenic flexure and descending colon).  Internal/external hemorrhoids.  Biopsy: Both polyps are tubular adenomas.   Review of Systems Past Medical History  Diagnosis Date  . Hypertension   . Nausea   . Gout   . MET TEST 08/15/2011    moderate peak VO2 limitation at 66% predicted; mod cardiac impairment with low SV & low anaerobic threshold, mild chronotropic incompetence; low risk  . History of echocardiogram 09/14/2011    EF >55%; borderline concentric LVH;   . History of stress test 06/21/2010    exercise; normal study  . Colon polyps   . High cholesterol     Past Surgical History  Procedure Laterality Date  . Hernia repair      x 2  . Nasal polyp surgery       2-3 months ago  . Cholecystectomy    . Hip surgery      Rt hip age 59  . Colonoscopy N/A 09/21/2012    Procedure: COLONOSCOPY;  Surgeon: Rogene Houston, MD;  Location: [facility];  Service: Endoscopy;  Laterality: N/A;  730    Allergies  Allergen Reactions  . Penicillins Other (See Comments)    Unknown  . Tetanus Toxoids Rash  Current Outpatient Prescriptions on File Prior to Visit  Medication Sig Dispense Refill  . acetaminophen (TYLENOL) 500 MG tablet Take 1,000 mg by mouth every 6 (six) hours as needed. Pain    . aspirin 81 MG tablet Take 81 mg by mouth daily.     . buPROPion (WELLBUTRIN XL) 150 MG 24 hr tablet Take 1 tablet by mouth daily.    . cholecalciferol (VITAMIN D) 1000 UNITS tablet Take 1,000 Units by mouth 2 (two) times daily before a meal.     . enalapril (VASOTEC) 10 MG tablet Take 10 mg by mouth 2 (two) times daily.    . febuxostat (ULORIC) 40 MG tablet Take 80 mg by mouth daily as needed. Swelling of the feet.    . fexofenadine (ALLEGRA) 180 MG tablet Take 180 mg by mouth daily.    . isosorbide mononitrate (IMDUR) 30 MG 24 hr tablet Take 1 tablet (30 mg total) by mouth daily. 30 tablet 11  . loperamide (IMODIUM) 2 MG capsule TAKE ONE CAPSULE BY MOUTH FOUR TIMES DAILY AS NEEDED FOR DIARRHEA OR LOSE STOOLS 120 capsule 5  . loperamide (IMODIUM) 2 MG capsule TAKE ONE CAPSULE BY MOUTH FOUR TIMES DAILY AS NEEDED  FOR DIARRHEA OR LOOSE STOOLS 120 capsule 3  . metoprolol tartrate (LOPRESSOR) 25 MG tablet Take 50 mg by mouth 2 (two) times daily.     . omeprazole (PRILOSEC) 40 MG capsule TAKE ONE CAPSULE BY MOUTH ONCE DAILY 90 capsule 3  . sertraline (ZOLOFT) 50 MG tablet Take 50 mg by mouth 2 (two) times daily.      No current facility-administered medications on file prior to visit.        Objective:   Physical ExamBlood pressure 130/80, pulse 72, temperature 98 F (36.7 C), height 6' 3" (1.905 m), weight 234 lb (106.142 kg). \Alert and oriented. Skin warm and dry. Oral mucosa is moist.   . Sclera anicteric, conjunctivae is pink. Thyroid not enlarged. No cervical lymphadenopathy. Lungs clear. Heart regular rate and rhythm.  Abdomen is soft. Bowel sounds are positive. No hepatomegaly. No abdominal masses felt. No tenderness.  No edema to lower extremities.          Assessment & Plan:  Chronic nausea: especially in the am. Friend with him describes as more of a nasal drainage. Thrombocytopenia. Platelets have been normal since 2013.  OV in 1 year. CBC today 

## 2014-06-11 NOTE — Patient Instructions (Addendum)
Continue present medication. OV in 1 year with Dr. Laural Golden.  Platelet have been normal since 2013.

## 2014-08-14 ENCOUNTER — Emergency Department (HOSPITAL_COMMUNITY): Payer: Medicare Other

## 2014-08-14 ENCOUNTER — Encounter (HOSPITAL_COMMUNITY): Payer: Self-pay | Admitting: *Deleted

## 2014-08-14 ENCOUNTER — Emergency Department (HOSPITAL_COMMUNITY)
Admission: EM | Admit: 2014-08-14 | Discharge: 2014-08-14 | Disposition: A | Discharge status: Home / Self Care | Payer: Medicare Other | Attending: Emergency Medicine | Admitting: Emergency Medicine

## 2014-08-14 DIAGNOSIS — M1711 Unilateral primary osteoarthritis, right knee: Secondary | ICD-10-CM | POA: Diagnosis not present

## 2014-08-14 DIAGNOSIS — Z86010 Personal history of colonic polyps: Secondary | ICD-10-CM | POA: Insufficient documentation

## 2014-08-14 DIAGNOSIS — Z87448 Personal history of other diseases of urinary system: Secondary | ICD-10-CM | POA: Diagnosis not present

## 2014-08-14 DIAGNOSIS — M109 Gout, unspecified: Secondary | ICD-10-CM | POA: Insufficient documentation

## 2014-08-14 DIAGNOSIS — I10 Essential (primary) hypertension: Secondary | ICD-10-CM | POA: Diagnosis not present

## 2014-08-14 DIAGNOSIS — R52 Pain, unspecified: Secondary | ICD-10-CM | POA: Diagnosis not present

## 2014-08-14 DIAGNOSIS — Z79899 Other long term (current) drug therapy: Secondary | ICD-10-CM | POA: Diagnosis not present

## 2014-08-14 DIAGNOSIS — Z880 Allergy status to penicillin: Secondary | ICD-10-CM | POA: Insufficient documentation

## 2014-08-14 DIAGNOSIS — Z7982 Long term (current) use of aspirin: Secondary | ICD-10-CM | POA: Diagnosis not present

## 2014-08-14 DIAGNOSIS — G8929 Other chronic pain: Secondary | ICD-10-CM | POA: Insufficient documentation

## 2014-08-14 DIAGNOSIS — M25561 Pain in right knee: Secondary | ICD-10-CM | POA: Diagnosis not present

## 2014-08-14 DIAGNOSIS — S8991XA Unspecified injury of right lower leg, initial encounter: Secondary | ICD-10-CM | POA: Diagnosis not present

## 2014-08-14 DIAGNOSIS — E780 Pure hypercholesterolemia: Secondary | ICD-10-CM | POA: Insufficient documentation

## 2014-08-14 HISTORY — DX: Disorder of kidney and ureter, unspecified: N28.9

## 2014-08-14 MED ORDER — ACETAMINOPHEN 325 MG PO TABS
650.0000 mg | ORAL_TABLET | Freq: Once | ORAL | Status: AC
  Administered 2014-08-14: 650 mg via ORAL
  Filled 2014-08-14: qty 2

## 2014-08-14 MED ORDER — MELOXICAM 15 MG PO TABS: 15.0000 mg | ORAL_TABLET | Freq: Every day | ORAL | Status: DC

## 2014-08-14 MED ORDER — TRAMADOL HCL 50 MG PO TABS: ORAL_TABLET | ORAL | Status: DC

## 2014-08-14 MED ORDER — KETOROLAC TROMETHAMINE 10 MG PO TABS
10.0000 mg | ORAL_TABLET | Freq: Once | ORAL | Status: AC
  Administered 2014-08-14: 10 mg via ORAL
  Filled 2014-08-14: qty 1

## 2014-08-14 MED ORDER — DEXAMETHASONE 6 MG PO TABS: ORAL_TABLET | ORAL | Status: DC

## 2014-08-14 MED ORDER — PREDNISONE 50 MG PO TABS
60.0000 mg | ORAL_TABLET | Freq: Once | ORAL | Status: AC
  Administered 2014-08-14: 60 mg via ORAL
  Filled 2014-08-14 (×2): qty 1

## 2014-08-14 NOTE — ED Notes (Signed)
Pt comes in from home by EMS for right knee pain that started last Friday. Pt sates he fell on his knee 3 weeks ago. He had an appt with his family doctor for next week but the pain has increased since then.

## 2014-08-14 NOTE — ED Provider Notes (Signed)
CSN: [id number]     Arrival date & time 08/14/14  0725 History   First MD Initiated Contact with Patient 08/14/14 [phone]     Chief Complaint  Patient presents with  . Knee Pain     (Consider location/radiation/quality/duration/timing/severity/associated sxs/prior Treatment) Patient is a 64 y.o. male presenting with knee pain. The history is provided by the patient.  Knee Pain Location:  Knee Knee location:  R knee Pain details:    Quality:  Aching and throbbing   Severity:  Moderate   Onset quality:  Gradual   Duration:  6 days   Timing:  Intermittent   Progression:  Worsening Chronicity: acute on chronic. Dislocation: no   Foreign body present:  No foreign bodies Prior injury to area:  Yes Relieved by:  Nothing Worsened by:  Bearing weight Ineffective treatments:  None tried Associated symptoms: decreased ROM and swelling     Past Medical History  Diagnosis Date  . Hypertension   . Nausea   . Gout   . MET TEST 08/15/2011    moderate peak VO2 limitation at 66% predicted; mod cardiac impairment with low SV & low anaerobic threshold, mild chronotropic incompetence; low risk  . History of echocardiogram 09/14/2011    EF >55%; borderline concentric LVH;   . History of stress test 06/21/2010    exercise; normal study  . Colon polyps   . High cholesterol   . Renal disorder    Past Surgical History  Procedure Laterality Date  . Hernia repair      x 2  . Nasal polyp surgery       2-3 months ago  . Cholecystectomy    . Hip surgery      Rt hip age 30  . Colonoscopy N/A 09/21/2012    Procedure: COLONOSCOPY;  Surgeon: Rogene Houston, MD;  [facility]: [facility];  Service: Endoscopy;  Laterality: N/A;  730   Family History  Problem Relation Age of Onset  . Colon cancer Brother    History  Substance Use Topics  . Smoking status: Never Smoker   . Smokeless tobacco: Never Used  . Alcohol Use: No    Review of Systems  Musculoskeletal: Positive for arthralgias.  All  other systems reviewed and are negative.     Allergies  Penicillins and Tetanus toxoids  Home Medications   Prior to Admission medications   Medication Sig Start Date End Date Taking? Authorizing Provider  acetaminophen (TYLENOL) 500 MG tablet Take 1,000 mg by mouth every 6 (six) hours as needed. Pain    Historical Provider, MD  aspirin 81 MG tablet Take 81 mg by mouth daily.     Historical Provider, MD  buPROPion (WELLBUTRIN XL) 150 MG 24 hr tablet Take 1 tablet by mouth daily. 08/29/12   Historical Provider, MD  cholecalciferol (VITAMIN D) 1000 UNITS tablet Take 1,000 Units by mouth 2 (two) times daily before a meal.     Historical Provider, MD  enalapril (VASOTEC) 10 MG tablet Take 10 mg by mouth 2 (two) times daily.    Historical Provider, MD  febuxostat (ULORIC) 40 MG tablet Take 80 mg by mouth daily as needed. Swelling of the feet.    Historical Provider, MD  fexofenadine (ALLEGRA) 180 MG tablet Take 180 mg by mouth daily.    Historical Provider, MD  isosorbide mononitrate (IMDUR) 30 MG 24 hr tablet Take 1 tablet (30 mg total) by mouth daily. 01/08/14   Pixie Casino, MD  loperamide (IMODIUM) 2  MG capsule TAKE ONE CAPSULE BY MOUTH FOUR TIMES DAILY AS NEEDED FOR DIARRHEA OR LOSE STOOLS 07/25/13   Rogene Houston, MD  loperamide (IMODIUM) 2 MG capsule TAKE ONE CAPSULE BY MOUTH FOUR TIMES DAILY AS NEEDED FOR DIARRHEA OR LOOSE STOOLS 02/28/14   Butch Penny, NP  metoprolol tartrate (LOPRESSOR) 25 MG tablet Take 50 mg by mouth 2 (two) times daily.     Historical Provider, MD  Omega-3 Fatty Acids (FISH OIL) 1000 MG CAPS Take by mouth.    Historical Provider, MD  omeprazole (PRILOSEC) 40 MG capsule TAKE ONE CAPSULE BY MOUTH ONCE DAILY 03/11/14   Butch Penny, NP  sertraline (ZOLOFT) 50 MG tablet Take 50 mg by mouth 2 (two) times daily.     Historical Provider, MD   BP 161/96 mmHg  Pulse 90  Temp(Src) 98.4 F (36.9 C) (Oral)  Resp 18  Ht 6' 5"  (1.956 m)  Wt 220 lb (99.791 kg)  BMI  26.08 kg/m2  SpO2 91% Physical Exam  Constitutional: He is oriented to person, place, and time. He appears well-developed and well-nourished.  Non-toxic appearance.  HENT:  Head: Normocephalic.  Right Ear: Tympanic membrane and external ear normal.  Left Ear: Tympanic membrane and external ear normal.  Eyes: EOM and lids are normal. Pupils are equal, round, and reactive to light.  Neck: Normal range of motion. Neck supple. Carotid bruit is not present.  Cardiovascular: Normal rate, regular rhythm, normal heart sounds, intact distal pulses and normal pulses.   Pulmonary/Chest: Breath sounds normal. No respiratory distress.  Abdominal: Soft. Bowel sounds are normal. There is no tenderness. There is no guarding.  Musculoskeletal:       Right knee: He exhibits decreased range of motion and swelling. He exhibits no deformity. Tenderness found. Medial joint line tenderness noted.  Callous  On the plantar surface of the right 2nd toe. No evidence of infection. Neg Homan's Sign  Lymphadenopathy:       Head (right side): No submandibular adenopathy present.       Head (left side): No submandibular adenopathy present.    He has no cervical adenopathy.  Neurological: He is alert and oriented to person, place, and time. He has normal strength. No cranial nerve deficit or sensory deficit.  Skin: Skin is warm and dry.  Psychiatric: He has a normal mood and affect. His speech is normal.  Nursing note and vitals reviewed.   ED Course  Procedures (including critical care time) Labs Review Labs Reviewed - No data to display  Imaging Review No results found.   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 No effusion. No fx or dislocation. Pt fitted with knee immobilizer. Rx for mobic, ultram, and decadron given. Pt has a walker. He will f/u with Dr Gerarda Fraction.   Final diagnoses:  None    *I have reviewed nursing notes, vital signs, and all appropriate lab and imaging results  for this patient.[address] PA-C 08/14/14 7615  Fredia Sorrow, MD 08/14/14 [phone]

## 2014-08-14 NOTE — Discharge Instructions (Signed)
Your x-ray shows advanced arthritis, with joint space narrowing. Please discuss with Dr.Fusco as soon as possible. Please use the knee immobilizer until you're seen by Dr. Lucinda Dell Arthritis, Nonspecific Arthritis is inflammation of a joint. This usually means pain, redness, warmth or swelling are present. One or more joints may be involved. There are a number of types of arthritis. Your caregiver may not be able to tell what type of arthritis you have right away. CAUSES  The most common cause of arthritis is the wear and tear on the joint (osteoarthritis). This causes damage to the cartilage, which can break down over time. The knees, hips, back and neck are most often affected by this type of arthritis. Other types of arthritis and common causes of joint pain include:  Sprains and other injuries near the joint. Sometimes minor sprains and injuries cause pain and swelling that develop hours later.  Rheumatoid arthritis. This affects hands, feet and knees. It usually affects both sides of your body at the same time. It is often associated with chronic ailments, fever, weight loss and general weakness.  Crystal arthritis. Gout and pseudo gout can cause occasional acute severe pain, redness and swelling in the foot, ankle, or knee.  Infectious arthritis. Bacteria can get into a joint through a break in overlying skin. This can cause infection of the joint. Bacteria and viruses can also spread through the blood and affect your joints.  Drug, infectious and allergy reactions. Sometimes joints can become mildly painful and slightly swollen with these types of illnesses. SYMPTOMS   Pain is the main symptom.  Your joint or joints can also be red, swollen and warm or hot to the touch.  You may have a fever with certain types of arthritis, or even feel overall ill.  The joint with arthritis will hurt with movement. Stiffness is present with some types of arthritis. DIAGNOSIS  Your caregiver will suspect  arthritis based on your description of your symptoms and on your exam. Testing may be needed to find the type of arthritis:  Blood and sometimes urine tests.  X-ray tests and sometimes CT or MRI scans.  Removal of fluid from the joint (arthrocentesis) is done to check for bacteria, crystals or other causes. Your caregiver (or a specialist) will numb the area over the joint with a local anesthetic, and use a needle to remove joint fluid for examination. This procedure is only minimally uncomfortable.  Even with these tests, your caregiver may not be able to tell what kind of arthritis you have. Consultation with a specialist (rheumatologist) may be helpful. TREATMENT  Your caregiver will discuss with you treatment specific to your type of arthritis. If the specific type cannot be determined, then the following general recommendations may apply. Treatment of severe joint pain includes:  Rest.  Elevation.  Anti-inflammatory medication (for example, ibuprofen) may be prescribed. Avoiding activities that cause increased pain.  Only take over-the-counter or prescription medicines for pain and discomfort as recommended by your caregiver.  Cold packs over an inflamed joint may be used for 10 to 15 minutes every hour. Hot packs sometimes feel better, but do not use overnight. Do not use hot packs if you are diabetic without your caregiver's permission.  A cortisone shot into arthritic joints may help reduce pain and swelling.  Any acute arthritis that gets worse over the next 1 to 2 days needs to be looked at to be sure there is no joint infection. Long-term arthritis treatment involves modifying activities and  lifestyle to reduce joint stress jarring. This can include weight loss. Also, exercise is needed to nourish the joint cartilage and remove waste. This helps keep the muscles around the joint strong. HOME CARE INSTRUCTIONS   Do not take aspirin to relieve pain if gout is suspected. This  elevates uric acid levels.  Only take over-the-counter or prescription medicines for pain, discomfort or fever as directed by your caregiver.  Rest the joint as much as possible.  If your joint is swollen, keep it elevated.  Use crutches if the painful joint is in your leg.  Drinking plenty of fluids may help for certain types of arthritis.  Follow your caregiver's dietary instructions.  Try low-impact exercise such as:  Swimming.  Water aerobics.  Biking.  Walking.  Morning stiffness is often relieved by a warm shower.  Put your joints through regular range-of-motion. SEEK MEDICAL CARE IF:   You do not feel better in 24 hours or are getting worse.  You have side effects to medications, or are not getting better with treatment. SEEK IMMEDIATE MEDICAL CARE IF:   You have a fever.  You develop severe joint pain, swelling or redness.  Many joints are involved and become painful and swollen.  There is severe back pain and/or leg weakness.  You have loss of bowel or bladder control. Document Released: 03/10/2004 Document Revised: 04/25/2011 Document Reviewed: 03/26/2008 [facility] Patient Information 2015 [place], [place]. This information is not intended to replace advice given to you by your health care provider. Make sure you discuss any questions you have with your health care provider. o. You do not have to sleep in this device. Please use Decadron and Modic daily. Use Ultram for pain if needed. Ultram may cause drowsiness, please use this medication with caution.

## 2014-08-14 NOTE — ED Notes (Signed)
Hobson Bryant at bedside 

## 2014-08-15 DIAGNOSIS — I129 Hypertensive chronic kidney disease with stage 1 through stage 4 chronic kidney disease, or unspecified chronic kidney disease: Secondary | ICD-10-CM | POA: Diagnosis not present

## 2014-08-15 DIAGNOSIS — D649 Anemia, unspecified: Secondary | ICD-10-CM | POA: Diagnosis not present

## 2014-08-15 DIAGNOSIS — E519 Thiamine deficiency, unspecified: Secondary | ICD-10-CM | POA: Diagnosis not present

## 2014-08-15 DIAGNOSIS — M109 Gout, unspecified: Secondary | ICD-10-CM | POA: Diagnosis not present

## 2014-08-15 DIAGNOSIS — R809 Proteinuria, unspecified: Secondary | ICD-10-CM | POA: Diagnosis not present

## 2014-08-15 DIAGNOSIS — N183 Chronic kidney disease, stage 3 (moderate): Secondary | ICD-10-CM | POA: Diagnosis not present

## 2014-08-19 DIAGNOSIS — E559 Vitamin D deficiency, unspecified: Secondary | ICD-10-CM | POA: Diagnosis not present

## 2014-08-19 DIAGNOSIS — N183 Chronic kidney disease, stage 3 (moderate): Secondary | ICD-10-CM | POA: Diagnosis not present

## 2014-08-19 DIAGNOSIS — I10 Essential (primary) hypertension: Secondary | ICD-10-CM | POA: Diagnosis not present

## 2014-08-19 DIAGNOSIS — M109 Gout, unspecified: Secondary | ICD-10-CM | POA: Diagnosis not present

## 2014-08-22 ENCOUNTER — Other Ambulatory Visit (INDEPENDENT_AMBULATORY_CARE_PROVIDER_SITE_OTHER): Payer: Self-pay | Admitting: Internal Medicine

## 2014-09-03 DIAGNOSIS — Z1389 Encounter for screening for other disorder: Secondary | ICD-10-CM | POA: Diagnosis not present

## 2014-09-03 DIAGNOSIS — Z0000 Encounter for general adult medical examination without abnormal findings: Secondary | ICD-10-CM | POA: Diagnosis not present

## 2014-09-03 DIAGNOSIS — Z6826 Body mass index (BMI) 26.0-26.9, adult: Secondary | ICD-10-CM | POA: Diagnosis not present

## 2014-09-03 DIAGNOSIS — R7309 Other abnormal glucose: Secondary | ICD-10-CM | POA: Diagnosis not present

## 2014-09-03 DIAGNOSIS — D696 Thrombocytopenia, unspecified: Secondary | ICD-10-CM | POA: Diagnosis not present

## 2014-09-03 DIAGNOSIS — E782 Mixed hyperlipidemia: Secondary | ICD-10-CM | POA: Diagnosis not present

## 2014-09-03 DIAGNOSIS — I10 Essential (primary) hypertension: Secondary | ICD-10-CM | POA: Diagnosis not present

## 2014-11-12 DIAGNOSIS — M25561 Pain in right knee: Secondary | ICD-10-CM | POA: Diagnosis not present

## 2014-11-12 DIAGNOSIS — I10 Essential (primary) hypertension: Secondary | ICD-10-CM | POA: Diagnosis not present

## 2014-11-12 DIAGNOSIS — R030 Elevated blood-pressure reading, without diagnosis of hypertension: Secondary | ICD-10-CM | POA: Diagnosis not present

## 2014-11-12 DIAGNOSIS — R509 Fever, unspecified: Secondary | ICD-10-CM | POA: Diagnosis not present

## 2014-11-12 DIAGNOSIS — Z79899 Other long term (current) drug therapy: Secondary | ICD-10-CM | POA: Diagnosis not present

## 2014-11-12 DIAGNOSIS — Z7982 Long term (current) use of aspirin: Secondary | ICD-10-CM | POA: Diagnosis not present

## 2014-11-12 DIAGNOSIS — M7051 Other bursitis of knee, right knee: Secondary | ICD-10-CM | POA: Diagnosis not present

## 2014-11-12 DIAGNOSIS — M71561 Other bursitis, not elsewhere classified, right knee: Secondary | ICD-10-CM | POA: Diagnosis not present

## 2014-12-25 ENCOUNTER — Other Ambulatory Visit: Payer: Self-pay | Admitting: Internal Medicine

## 2014-12-25 MED ORDER — ISOSORBIDE MONONITRATE ER 30 MG PO TB24: 30.0000 mg | ORAL_TABLET | Freq: Every day | ORAL | Status: DC

## 2015-02-10 ENCOUNTER — Other Ambulatory Visit (INDEPENDENT_AMBULATORY_CARE_PROVIDER_SITE_OTHER): Payer: Self-pay | Admitting: Internal Medicine

## 2015-02-12 DIAGNOSIS — I129 Hypertensive chronic kidney disease with stage 1 through stage 4 chronic kidney disease, or unspecified chronic kidney disease: Secondary | ICD-10-CM | POA: Diagnosis not present

## 2015-02-12 DIAGNOSIS — E559 Vitamin D deficiency, unspecified: Secondary | ICD-10-CM | POA: Diagnosis not present

## 2015-02-12 DIAGNOSIS — N183 Chronic kidney disease, stage 3 (moderate): Secondary | ICD-10-CM | POA: Diagnosis not present

## 2015-02-12 DIAGNOSIS — M109 Gout, unspecified: Secondary | ICD-10-CM | POA: Diagnosis not present

## 2015-02-12 DIAGNOSIS — R809 Proteinuria, unspecified: Secondary | ICD-10-CM | POA: Diagnosis not present

## 2015-02-12 DIAGNOSIS — D649 Anemia, unspecified: Secondary | ICD-10-CM | POA: Diagnosis not present

## 2015-02-17 DIAGNOSIS — I10 Essential (primary) hypertension: Secondary | ICD-10-CM | POA: Diagnosis not present

## 2015-02-17 DIAGNOSIS — R809 Proteinuria, unspecified: Secondary | ICD-10-CM | POA: Diagnosis not present

## 2015-02-17 DIAGNOSIS — M109 Gout, unspecified: Secondary | ICD-10-CM | POA: Diagnosis not present

## 2015-02-17 DIAGNOSIS — N183 Chronic kidney disease, stage 3 (moderate): Secondary | ICD-10-CM | POA: Diagnosis not present

## 2015-02-17 DIAGNOSIS — E559 Vitamin D deficiency, unspecified: Secondary | ICD-10-CM | POA: Diagnosis not present

## 2015-02-23 ENCOUNTER — Other Ambulatory Visit: Payer: Self-pay | Admitting: Internal Medicine

## 2015-02-23 NOTE — Telephone Encounter (Signed)
Rx(s) sent to pharmacy electronically.  

## 2015-02-27 ENCOUNTER — Encounter (INDEPENDENT_AMBULATORY_CARE_PROVIDER_SITE_OTHER): Payer: Self-pay | Admitting: Internal Medicine

## 2015-03-18 DIAGNOSIS — Z139 Encounter for screening, unspecified: Secondary | ICD-10-CM

## 2015-05-08 ENCOUNTER — Ambulatory Visit: Payer: Medicare Other | Admitting: Internal Medicine

## 2015-05-15 ENCOUNTER — Encounter: Payer: Self-pay | Admitting: Internal Medicine

## 2015-05-15 ENCOUNTER — Ambulatory Visit (INDEPENDENT_AMBULATORY_CARE_PROVIDER_SITE_OTHER): Payer: Medicare Other | Admitting: Internal Medicine

## 2015-05-15 VITALS — BP 168/88 | HR 100 | Ht 77.0 in | Wt 243.0 lb

## 2015-05-15 DIAGNOSIS — F79 Unspecified intellectual disabilities: Secondary | ICD-10-CM | POA: Diagnosis not present

## 2015-05-15 DIAGNOSIS — F419 Anxiety disorder, unspecified: Secondary | ICD-10-CM | POA: Diagnosis not present

## 2015-05-15 DIAGNOSIS — I10 Essential (primary) hypertension: Secondary | ICD-10-CM

## 2015-05-15 MED ORDER — AMLODIPINE BESYLATE 5 MG PO TABS: 5.0000 mg | ORAL_TABLET | Freq: Every day | ORAL | Status: DC

## 2015-05-15 MED ORDER — ISOSORBIDE MONONITRATE ER 30 MG PO TB24: 30.0000 mg | ORAL_TABLET | Freq: Every day | ORAL | Status: DC

## 2015-05-15 NOTE — Patient Instructions (Signed)
Your physician has recommended you make the following change in your medication: START amlodipine 5mg  once daily  Your physician recommends that you schedule a follow-up appointment in: 2 weeks with Erasmo Downer for a Blood Pressure Check   Your physician wants you to follow-up in: 1 year with Dr. Debara Pickett. You will receive a reminder letter in the mail two months in advance. If you don't receive a letter, please call our office to schedule the follow-up appointment.
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
## 2015-05-18 ENCOUNTER — Telehealth: Payer: Self-pay | Admitting: Pharmacist Clinician (PhC)/ Clinical Pharmacy Specialist

## 2015-05-18 NOTE — Telephone Encounter (Signed)
Left message for [name] to call and schedule 2 weeks bp check with kristin...(about 4/13)

## 2015-06-12 ENCOUNTER — Other Ambulatory Visit (INDEPENDENT_AMBULATORY_CARE_PROVIDER_SITE_OTHER): Payer: Self-pay | Admitting: Internal Medicine

## 2015-06-16 ENCOUNTER — Encounter (INDEPENDENT_AMBULATORY_CARE_PROVIDER_SITE_OTHER): Payer: Self-pay | Admitting: Internal Medicine

## 2015-06-16 ENCOUNTER — Ambulatory Visit (INDEPENDENT_AMBULATORY_CARE_PROVIDER_SITE_OTHER): Payer: Medicare Other | Admitting: Internal Medicine

## 2015-06-16 VITALS — BP 90/70 | HR 64 | Temp 97.6°F | Ht 77.0 in | Wt 232.1 lb

## 2015-06-16 DIAGNOSIS — D696 Thrombocytopenia, unspecified: Secondary | ICD-10-CM | POA: Diagnosis not present

## 2015-06-16 NOTE — Progress Notes (Signed)
Subjective:    Patient ID: [name], male    DOB: Aug 29, 1950, 65 y.o.   MRN: [id number]  HPIHere today for f/u of his thrombocytopenia. Hx of fatty liver. Platelets have been normal since 2013. He was last seen in April of 2016. Hx of chronic nausea. He says he has nausea in the morning.  He tells he has a hernia in his rt lower abdomen. Sometimes he has pain in his rt groin if he walks.  His appetite is good. He has maintained his weight. He has a BM x 3 a day. His stools are either hard or he has diarrhea. His last colonoscopy was in 2014.  He says he walks daily.    05/02/2012 US abdomen: IMPRESSION: 1. Surgically absent gallbladder. Normal CBD. 2. Probable fatty infiltration of the liver without definite focal hepatic mass. 3. Left renal cysts are noted the largest in lower pole measures 2.1 cm. No hydronephrosis or diagnostic renal calculus. 4. No aortic aneurysm.   CBC Latest Ref Rng 06/11/2014 05/14/2013 09/13/2012  WBC 4.0 - 10.5 K/uL 7.8 6.3 7.0  Hemoglobin 13.0 - 17.0 g/dL 14.6 14.8 13.9  Hematocrit 39.0 - 52.0 % 42.4 43.0 40.2  Platelets 150 - 400 K/uL 230 234 265       Hepatic Function Latest Ref Rng 05/14/2013 08/28/2011 07/20/2010  Total Protein 6.0 - 8.3 g/dL 7.4 7.1 6.5  Albumin 3.5 - 5.2 g/dL 4.4 3.8 3.7  AST 0 - 37 U/L _0 ALT 0 - 53 U/L _1 Alk Phosphatase 39 - 117 U/L 56 51 68  Total Bilirubin 0.2 - 1.2 mg/dL 1.2 1.1 0.4  Bilirubin, Direct 0.0 - 0.3 mg/dL 0.2 - -    4   09/21/2012  Colonoscopy.  Indications: Patient is 65 year old Caucasian male who is here for surveillance colonoscopy. He has history of colonic adenomas and family history of colon carcinoma(mother and a brother).  Impression:  Examination performed to cecum. Single cecal diverticulum. Two small polyps ablated via cold biopsy and submitted together(splenic flexure and descending colon). Internal/external hemorrhoids.   Both polyps are tubular adenomas. Next  colonoscopy in 5 years   Review of Systems Past Medical History  Diagnosis Date  . Hypertension   . Nausea   . Gout   . MET TEST 08/15/2011    moderate peak VO2 limitation at 66% predicted; mod cardiac impairment with low SV & low anaerobic threshold, mild chronotropic incompetence; low risk  . History of echocardiogram 09/14/2011    EF >55%; borderline concentric LVH;   . History of stress test 06/21/2010    exercise; normal study  . Colon polyps   . High cholesterol   . Renal disorder     Past Surgical History  Procedure Laterality Date  . Hernia repair      x 2  . Nasal polyp surgery       2-3 months ago  . Cholecystectomy    . Hip surgery      Rt hip age 34  . Colonoscopy N/A 09/21/2012    Procedure: COLONOSCOPY;  Surgeon: Rogene Houston, MD;  [facility]: [facility];  Service: Endoscopy;  Laterality: N/A;  730    Allergies  Allergen Reactions  . Penicillins Other (See Comments)    Unknown  . Tetanus Toxoids Rash    Current Outpatient Prescriptions on File Prior to Visit  Medication Sig Dispense Refill  . acetaminophen (TYLENOL) 500 MG tablet Take 1,000 mg by  mouth every 6 (six) hours as needed. Pain    . amLODipine (NORVASC) 5 MG tablet Take 1 tablet (5 mg total) by mouth daily. 30 tablet 6  . aspirin 81 MG tablet Take 81 mg by mouth daily.     Marland Kitchen buPROPion (WELLBUTRIN XL) 150 MG 24 hr tablet Take 1 tablet by mouth daily.    . cholecalciferol (VITAMIN D) 1000 UNITS tablet Take 1,000 Units by mouth daily. Reported on 05/15/2015    . enalapril (VASOTEC) 10 MG tablet Take 10 mg by mouth 2 (two) times daily.    . fexofenadine (ALLEGRA) 180 MG tablet Take 180 mg by mouth daily.    . isosorbide mononitrate (IMDUR) 30 MG 24 hr tablet Take 1 tablet (30 mg total) by mouth daily. 30 tablet 6  . loperamide (IMODIUM) 2 MG capsule TAKE ONE CAPSULE BY MOUTH FOUR TIMES DAILY AS NEEDED FOR DIARRHEA OR LOOSE STOOLS 120 capsule 1  . metoprolol (LOPRESSOR) 50 MG tablet Take 1 tablet by  mouth 2 (two) times daily.    . Omega-3 Fatty Acids (FISH OIL) 1000 MG CAPS Take by mouth.    Marland Kitchen omeprazole (PRILOSEC) 40 MG capsule TAKE ONE CAPSULE BY MOUTH ONCE DAILY. 90 capsule 4  . sertraline (ZOLOFT) 100 MG tablet Take 0.5 tablets by mouth 2 (two) times daily.  11  . ULORIC 80 MG TABS Take 1 tablet by mouth daily.  99   No current facility-administered medications on file prior to visit.        Objective:   Physical ExamBlood pressure 90/70, pulse 64, temperature 97.6 F (36.4 C), height _0  (1.956 m), weight 232 lb 1.6 oz (105.28 kg). Alert and oriented. Skin warm and dry. Oral mucosa is moist.   . Sclera anicteric, conjunctivae is pink. Thyroid not enlarged. No cervical lymphadenopathy. Lungs clear. Heart regular rate and rhythm.  Abdomen is soft. Bowel sounds are positive. No hepatomegaly. No abdominal masses felt. No tenderness.  No edema to lower extremities.          Assessment & Plan:  Thrombocytopenia. Normal since 2013.  CBC today OV in 1 year.

## 2015-06-16 NOTE — Patient Instructions (Signed)
Ov in 1 year.  

## 2015-06-17 LAB — CBC WITH DIFFERENTIAL/PLATELET
Basophils Absolute: 96 cells/uL (ref 0–200)
Basophils Relative: 1 %
EOS PCT: 2 %
Eosinophils Absolute: 192 cells/uL (ref 15–500)
HEMATOCRIT: 40.8 % (ref 38.5–50.0)
Hemoglobin: 13.9 g/dL (ref 13.2–17.1)
Lymphocytes Relative: 25 %
Lymphs Abs: 2400 cells/uL (ref 850–3900)
MCH: 29.9 pg (ref 27.0–33.0)
MCHC: 34.1 g/dL (ref 32.0–36.0)
MCV: 87.7 fL (ref 80.0–100.0)
MONOS PCT: 7 %
MPV: 11.6 fL (ref 7.5–12.5)
Monocytes Absolute: 672 cells/uL (ref 200–950)
NEUTROS PCT: 65 %
Neutro Abs: 6240 cells/uL (ref 1500–7800)
PLATELETS: 200 10*3/uL (ref 140–400)
RBC: 4.65 MIL/uL (ref 4.20–5.80)
RDW: 14.3 % (ref 11.0–15.0)
WBC: 9.6 10*3/uL (ref 3.8–10.8)

## 2015-07-01 DIAGNOSIS — W010XXA Fall on same level from slipping, tripping and stumbling without subsequent striking against object, initial encounter: Secondary | ICD-10-CM | POA: Diagnosis not present

## 2015-07-01 DIAGNOSIS — Z887 Allergy status to serum and vaccine status: Secondary | ICD-10-CM | POA: Diagnosis not present

## 2015-07-01 DIAGNOSIS — I2510 Atherosclerotic heart disease of native coronary artery without angina pectoris: Secondary | ICD-10-CM | POA: Diagnosis not present

## 2015-07-01 DIAGNOSIS — R479 Unspecified speech disturbances: Secondary | ICD-10-CM | POA: Diagnosis not present

## 2015-07-01 DIAGNOSIS — K859 Acute pancreatitis without necrosis or infection, unspecified: Secondary | ICD-10-CM | POA: Diagnosis not present

## 2015-07-01 DIAGNOSIS — M25561 Pain in right knee: Secondary | ICD-10-CM | POA: Diagnosis not present

## 2015-07-01 DIAGNOSIS — Z881 Allergy status to other antibiotic agents status: Secondary | ICD-10-CM | POA: Diagnosis not present

## 2015-07-01 DIAGNOSIS — Z880 Allergy status to penicillin: Secondary | ICD-10-CM | POA: Diagnosis not present

## 2015-07-01 DIAGNOSIS — Z888 Allergy status to other drugs, medicaments and biological substances status: Secondary | ICD-10-CM | POA: Diagnosis not present

## 2015-07-01 DIAGNOSIS — Z79899 Other long term (current) drug therapy: Secondary | ICD-10-CM | POA: Diagnosis not present

## 2015-07-01 DIAGNOSIS — R030 Elevated blood-pressure reading, without diagnosis of hypertension: Secondary | ICD-10-CM | POA: Diagnosis not present

## 2015-07-01 DIAGNOSIS — M25521 Pain in right elbow: Secondary | ICD-10-CM | POA: Diagnosis not present

## 2015-07-01 DIAGNOSIS — S59901A Unspecified injury of right elbow, initial encounter: Secondary | ICD-10-CM | POA: Diagnosis not present

## 2015-07-01 DIAGNOSIS — N183 Chronic kidney disease, stage 3 (moderate): Secondary | ICD-10-CM | POA: Diagnosis not present

## 2015-07-01 DIAGNOSIS — I129 Hypertensive chronic kidney disease with stage 1 through stage 4 chronic kidney disease, or unspecified chronic kidney disease: Secondary | ICD-10-CM | POA: Diagnosis not present

## 2015-07-01 DIAGNOSIS — S5001XA Contusion of right elbow, initial encounter: Secondary | ICD-10-CM | POA: Diagnosis not present

## 2015-07-01 DIAGNOSIS — S8991XA Unspecified injury of right lower leg, initial encounter: Secondary | ICD-10-CM | POA: Diagnosis not present

## 2015-07-01 DIAGNOSIS — S8001XA Contusion of right knee, initial encounter: Secondary | ICD-10-CM | POA: Diagnosis not present

## 2015-07-01 DIAGNOSIS — H8110 Benign paroxysmal vertigo, unspecified ear: Secondary | ICD-10-CM | POA: Diagnosis not present

## 2015-07-01 DIAGNOSIS — Z7982 Long term (current) use of aspirin: Secondary | ICD-10-CM | POA: Diagnosis not present

## 2015-07-01 DIAGNOSIS — R112 Nausea with vomiting, unspecified: Secondary | ICD-10-CM | POA: Diagnosis not present

## 2015-07-02 DIAGNOSIS — K858 Other acute pancreatitis without necrosis or infection: Secondary | ICD-10-CM | POA: Diagnosis not present

## 2015-07-03 DIAGNOSIS — R410 Disorientation, unspecified: Secondary | ICD-10-CM | POA: Diagnosis not present

## 2015-07-03 DIAGNOSIS — K858 Other acute pancreatitis without necrosis or infection: Secondary | ICD-10-CM | POA: Diagnosis not present

## 2015-07-03 DIAGNOSIS — I10 Essential (primary) hypertension: Secondary | ICD-10-CM | POA: Diagnosis not present

## 2015-07-04 DIAGNOSIS — K859 Acute pancreatitis without necrosis or infection, unspecified: Secondary | ICD-10-CM | POA: Diagnosis not present

## 2015-07-05 DIAGNOSIS — M109 Gout, unspecified: Secondary | ICD-10-CM | POA: Diagnosis not present

## 2015-07-05 DIAGNOSIS — I10 Essential (primary) hypertension: Secondary | ICD-10-CM | POA: Diagnosis not present

## 2015-07-05 DIAGNOSIS — F419 Anxiety disorder, unspecified: Secondary | ICD-10-CM | POA: Diagnosis not present

## 2015-07-05 DIAGNOSIS — R479 Unspecified speech disturbances: Secondary | ICD-10-CM | POA: Diagnosis not present

## 2015-07-05 DIAGNOSIS — K219 Gastro-esophageal reflux disease without esophagitis: Secondary | ICD-10-CM | POA: Diagnosis not present

## 2015-07-05 DIAGNOSIS — Z9181 History of falling: Secondary | ICD-10-CM | POA: Diagnosis not present

## 2015-07-05 DIAGNOSIS — F329 Major depressive disorder, single episode, unspecified: Secondary | ICD-10-CM | POA: Diagnosis not present

## 2015-07-05 DIAGNOSIS — Z79891 Long term (current) use of opiate analgesic: Secondary | ICD-10-CM | POA: Diagnosis not present

## 2015-07-06 DIAGNOSIS — M109 Gout, unspecified: Secondary | ICD-10-CM | POA: Diagnosis not present

## 2015-07-06 DIAGNOSIS — K219 Gastro-esophageal reflux disease without esophagitis: Secondary | ICD-10-CM | POA: Diagnosis not present

## 2015-07-06 DIAGNOSIS — Z9181 History of falling: Secondary | ICD-10-CM | POA: Diagnosis not present

## 2015-07-06 DIAGNOSIS — I10 Essential (primary) hypertension: Secondary | ICD-10-CM | POA: Diagnosis not present

## 2015-07-06 DIAGNOSIS — F329 Major depressive disorder, single episode, unspecified: Secondary | ICD-10-CM | POA: Diagnosis not present

## 2015-07-06 DIAGNOSIS — F419 Anxiety disorder, unspecified: Secondary | ICD-10-CM | POA: Diagnosis not present

## 2015-07-06 DIAGNOSIS — R479 Unspecified speech disturbances: Secondary | ICD-10-CM | POA: Diagnosis not present

## 2015-07-06 DIAGNOSIS — Z79891 Long term (current) use of opiate analgesic: Secondary | ICD-10-CM | POA: Diagnosis not present

## 2015-07-09 DIAGNOSIS — Z79891 Long term (current) use of opiate analgesic: Secondary | ICD-10-CM | POA: Diagnosis not present

## 2015-07-09 DIAGNOSIS — M109 Gout, unspecified: Secondary | ICD-10-CM | POA: Diagnosis not present

## 2015-07-09 DIAGNOSIS — F419 Anxiety disorder, unspecified: Secondary | ICD-10-CM | POA: Diagnosis not present

## 2015-07-09 DIAGNOSIS — I10 Essential (primary) hypertension: Secondary | ICD-10-CM | POA: Diagnosis not present

## 2015-07-09 DIAGNOSIS — F329 Major depressive disorder, single episode, unspecified: Secondary | ICD-10-CM | POA: Diagnosis not present

## 2015-07-09 DIAGNOSIS — R479 Unspecified speech disturbances: Secondary | ICD-10-CM | POA: Diagnosis not present

## 2015-07-09 DIAGNOSIS — Z9181 History of falling: Secondary | ICD-10-CM | POA: Diagnosis not present

## 2015-07-09 DIAGNOSIS — K219 Gastro-esophageal reflux disease without esophagitis: Secondary | ICD-10-CM | POA: Diagnosis not present

## 2015-07-12 DIAGNOSIS — R1013 Epigastric pain: Secondary | ICD-10-CM | POA: Diagnosis not present

## 2015-07-12 DIAGNOSIS — Z9049 Acquired absence of other specified parts of digestive tract: Secondary | ICD-10-CM | POA: Diagnosis not present

## 2015-07-12 DIAGNOSIS — K219 Gastro-esophageal reflux disease without esophagitis: Secondary | ICD-10-CM | POA: Diagnosis not present

## 2015-07-12 DIAGNOSIS — I10 Essential (primary) hypertension: Secondary | ICD-10-CM | POA: Diagnosis not present

## 2015-07-12 DIAGNOSIS — K2900 Acute gastritis without bleeding: Secondary | ICD-10-CM | POA: Diagnosis not present

## 2015-07-12 DIAGNOSIS — Z79899 Other long term (current) drug therapy: Secondary | ICD-10-CM | POA: Diagnosis not present

## 2015-07-12 DIAGNOSIS — N200 Calculus of kidney: Secondary | ICD-10-CM | POA: Diagnosis not present

## 2015-07-12 DIAGNOSIS — Z7982 Long term (current) use of aspirin: Secondary | ICD-10-CM | POA: Diagnosis not present

## 2015-07-12 DIAGNOSIS — R112 Nausea with vomiting, unspecified: Secondary | ICD-10-CM | POA: Diagnosis not present

## 2015-07-12 DIAGNOSIS — K529 Noninfective gastroenteritis and colitis, unspecified: Secondary | ICD-10-CM | POA: Diagnosis not present

## 2015-07-12 DIAGNOSIS — K2970 Gastritis, unspecified, without bleeding: Secondary | ICD-10-CM | POA: Diagnosis not present

## 2015-07-14 DIAGNOSIS — Z79891 Long term (current) use of opiate analgesic: Secondary | ICD-10-CM | POA: Diagnosis not present

## 2015-07-14 DIAGNOSIS — F419 Anxiety disorder, unspecified: Secondary | ICD-10-CM | POA: Diagnosis not present

## 2015-07-14 DIAGNOSIS — F329 Major depressive disorder, single episode, unspecified: Secondary | ICD-10-CM | POA: Diagnosis not present

## 2015-07-14 DIAGNOSIS — K219 Gastro-esophageal reflux disease without esophagitis: Secondary | ICD-10-CM | POA: Diagnosis not present

## 2015-07-14 DIAGNOSIS — M109 Gout, unspecified: Secondary | ICD-10-CM | POA: Diagnosis not present

## 2015-07-14 DIAGNOSIS — R479 Unspecified speech disturbances: Secondary | ICD-10-CM | POA: Diagnosis not present

## 2015-07-14 DIAGNOSIS — Z9181 History of falling: Secondary | ICD-10-CM | POA: Diagnosis not present

## 2015-07-14 DIAGNOSIS — I10 Essential (primary) hypertension: Secondary | ICD-10-CM | POA: Diagnosis not present

## 2015-07-15 DIAGNOSIS — F419 Anxiety disorder, unspecified: Secondary | ICD-10-CM | POA: Diagnosis not present

## 2015-07-15 DIAGNOSIS — I10 Essential (primary) hypertension: Secondary | ICD-10-CM | POA: Diagnosis not present

## 2015-07-15 DIAGNOSIS — M109 Gout, unspecified: Secondary | ICD-10-CM | POA: Diagnosis not present

## 2015-07-15 DIAGNOSIS — K219 Gastro-esophageal reflux disease without esophagitis: Secondary | ICD-10-CM | POA: Diagnosis not present

## 2015-07-15 DIAGNOSIS — Z9181 History of falling: Secondary | ICD-10-CM | POA: Diagnosis not present

## 2015-07-15 DIAGNOSIS — Z79891 Long term (current) use of opiate analgesic: Secondary | ICD-10-CM | POA: Diagnosis not present

## 2015-07-15 DIAGNOSIS — R479 Unspecified speech disturbances: Secondary | ICD-10-CM | POA: Diagnosis not present

## 2015-07-15 DIAGNOSIS — F329 Major depressive disorder, single episode, unspecified: Secondary | ICD-10-CM | POA: Diagnosis not present

## 2015-07-19 DIAGNOSIS — R000 Tachycardia, unspecified: Secondary | ICD-10-CM | POA: Diagnosis not present

## 2015-07-25 ENCOUNTER — Other Ambulatory Visit (INDEPENDENT_AMBULATORY_CARE_PROVIDER_SITE_OTHER): Payer: Self-pay | Admitting: Internal Medicine

## 2015-08-03 DIAGNOSIS — R809 Proteinuria, unspecified: Secondary | ICD-10-CM | POA: Diagnosis not present

## 2015-08-03 DIAGNOSIS — D509 Iron deficiency anemia, unspecified: Secondary | ICD-10-CM | POA: Diagnosis not present

## 2015-08-03 DIAGNOSIS — I129 Hypertensive chronic kidney disease with stage 1 through stage 4 chronic kidney disease, or unspecified chronic kidney disease: Secondary | ICD-10-CM | POA: Diagnosis not present

## 2015-08-03 DIAGNOSIS — N183 Chronic kidney disease, stage 3 (moderate): Secondary | ICD-10-CM | POA: Diagnosis not present

## 2015-08-03 DIAGNOSIS — E559 Vitamin D deficiency, unspecified: Secondary | ICD-10-CM | POA: Diagnosis not present

## 2015-08-03 DIAGNOSIS — M109 Gout, unspecified: Secondary | ICD-10-CM | POA: Diagnosis not present

## 2015-08-03 DIAGNOSIS — Z79899 Other long term (current) drug therapy: Secondary | ICD-10-CM | POA: Diagnosis not present

## 2015-08-04 DIAGNOSIS — N183 Chronic kidney disease, stage 3 (moderate): Secondary | ICD-10-CM | POA: Diagnosis not present

## 2015-08-04 DIAGNOSIS — M109 Gout, unspecified: Secondary | ICD-10-CM | POA: Diagnosis not present

## 2015-08-04 DIAGNOSIS — R809 Proteinuria, unspecified: Secondary | ICD-10-CM | POA: Diagnosis not present

## 2015-08-04 DIAGNOSIS — I10 Essential (primary) hypertension: Secondary | ICD-10-CM | POA: Diagnosis not present

## 2015-09-14 DIAGNOSIS — Z1389 Encounter for screening for other disorder: Secondary | ICD-10-CM | POA: Diagnosis not present

## 2015-09-14 DIAGNOSIS — Z0000 Encounter for general adult medical examination without abnormal findings: Secondary | ICD-10-CM | POA: Diagnosis not present

## 2015-09-14 DIAGNOSIS — F329 Major depressive disorder, single episode, unspecified: Secondary | ICD-10-CM | POA: Diagnosis not present

## 2015-09-14 DIAGNOSIS — M159 Polyosteoarthritis, unspecified: Secondary | ICD-10-CM | POA: Diagnosis not present

## 2015-09-18 DIAGNOSIS — Z139 Encounter for screening, unspecified: Secondary | ICD-10-CM

## 2015-09-18 NOTE — Congregational Nurse Program (Signed)
Congregational Nurse Program Note  Date of Encounter: 09/18/2015  Past Medical History: Past Medical History:  Diagnosis Date  . Colon polyps   . Gout   . High cholesterol   . History of echocardiogram 09/14/2011   EF >55%; borderline concentric LVH;   . History of stress test 06/21/2010   exercise; normal study  . Hypertension   . MET TEST 08/15/2011   moderate peak VO2 limitation at 66% predicted; mod cardiac impairment with low SV & low anaerobic threshold, mild chronotropic incompetence; low risk  . Nausea   . Renal disorder     Encounter Details:   Client complains of leg swelling, pain. Instructed client to Make appointment with PCP to access problem. [name] [facility]

## 2015-09-25 DIAGNOSIS — Z0000 Encounter for general adult medical examination without abnormal findings: Secondary | ICD-10-CM | POA: Diagnosis not present

## 2015-09-25 DIAGNOSIS — Z1389 Encounter for screening for other disorder: Secondary | ICD-10-CM | POA: Diagnosis not present

## 2015-09-25 DIAGNOSIS — Z6826 Body mass index (BMI) 26.0-26.9, adult: Secondary | ICD-10-CM | POA: Diagnosis not present

## 2015-09-25 DIAGNOSIS — E782 Mixed hyperlipidemia: Secondary | ICD-10-CM | POA: Diagnosis not present

## 2015-10-06 NOTE — Congregational Nurse Program (Signed)
Congregational Nurse Program Note  Date of Encounter: 03/18/2015  Past Medical History: Past Medical History:  Diagnosis Date  . Colon polyps   . Gout   . High cholesterol   . History of echocardiogram 09/14/2011   EF >55%; borderline concentric LVH;   . History of stress test 06/21/2010   exercise; normal study  . Hypertension   . MET TEST 08/15/2011   moderate peak VO2 limitation at 66% predicted; mod cardiac impairment with low SV & low anaerobic threshold, mild chronotropic incompetence; low risk  . Nausea   . Renal disorder     Encounter Details:  Client was seen at [facility] for BP check and complaints of R. Knee pain/swelling. Client had been to ER and had xray, states no abnormalities found. Instructed client to follow up with PCP. BP today 121/69 pulse-139. Client had been very active today, volunteers at [employer], and drinking coffee.  Tarri Fuller CNP [phone]

## 2015-11-13 ENCOUNTER — Other Ambulatory Visit: Payer: Self-pay | Admitting: Internal Medicine

## 2015-11-25 DIAGNOSIS — D509 Iron deficiency anemia, unspecified: Secondary | ICD-10-CM | POA: Diagnosis not present

## 2015-11-25 DIAGNOSIS — Z79899 Other long term (current) drug therapy: Secondary | ICD-10-CM | POA: Diagnosis not present

## 2015-11-25 DIAGNOSIS — M109 Gout, unspecified: Secondary | ICD-10-CM | POA: Diagnosis not present

## 2015-11-25 DIAGNOSIS — I129 Hypertensive chronic kidney disease with stage 1 through stage 4 chronic kidney disease, or unspecified chronic kidney disease: Secondary | ICD-10-CM | POA: Diagnosis not present

## 2015-11-25 DIAGNOSIS — E559 Vitamin D deficiency, unspecified: Secondary | ICD-10-CM | POA: Diagnosis not present

## 2015-11-25 DIAGNOSIS — R809 Proteinuria, unspecified: Secondary | ICD-10-CM | POA: Diagnosis not present

## 2015-11-25 DIAGNOSIS — N183 Chronic kidney disease, stage 3 (moderate): Secondary | ICD-10-CM | POA: Diagnosis not present

## 2015-12-12 ENCOUNTER — Other Ambulatory Visit: Payer: Self-pay | Admitting: Internal Medicine

## 2015-12-12 DIAGNOSIS — M79603 Pain in arm, unspecified: Secondary | ICD-10-CM | POA: Diagnosis not present

## 2015-12-12 DIAGNOSIS — M1990 Unspecified osteoarthritis, unspecified site: Secondary | ICD-10-CM | POA: Diagnosis not present

## 2015-12-12 DIAGNOSIS — M109 Gout, unspecified: Secondary | ICD-10-CM | POA: Diagnosis not present

## 2015-12-12 DIAGNOSIS — M25421 Effusion, right elbow: Secondary | ICD-10-CM | POA: Diagnosis not present

## 2015-12-12 DIAGNOSIS — Z8639 Personal history of other endocrine, nutritional and metabolic disease: Secondary | ICD-10-CM | POA: Diagnosis not present

## 2015-12-12 DIAGNOSIS — M10021 Idiopathic gout, right elbow: Secondary | ICD-10-CM | POA: Diagnosis not present

## 2015-12-12 DIAGNOSIS — Z8249 Family history of ischemic heart disease and other diseases of the circulatory system: Secondary | ICD-10-CM | POA: Diagnosis not present

## 2015-12-12 DIAGNOSIS — M79601 Pain in right arm: Secondary | ICD-10-CM | POA: Diagnosis not present

## 2015-12-12 DIAGNOSIS — I10 Essential (primary) hypertension: Secondary | ICD-10-CM | POA: Diagnosis not present

## 2015-12-12 DIAGNOSIS — R52 Pain, unspecified: Secondary | ICD-10-CM | POA: Diagnosis not present

## 2015-12-14 NOTE — Telephone Encounter (Signed)
Rx(s) sent to pharmacy electronically.  

## 2015-12-23 DIAGNOSIS — I10 Essential (primary) hypertension: Secondary | ICD-10-CM | POA: Diagnosis not present

## 2015-12-23 DIAGNOSIS — E559 Vitamin D deficiency, unspecified: Secondary | ICD-10-CM | POA: Diagnosis not present

## 2015-12-23 DIAGNOSIS — N183 Chronic kidney disease, stage 3 (moderate): Secondary | ICD-10-CM | POA: Diagnosis not present

## 2015-12-23 DIAGNOSIS — N179 Acute kidney failure, unspecified: Secondary | ICD-10-CM | POA: Diagnosis not present

## 2015-12-31 DIAGNOSIS — N184 Chronic kidney disease, stage 4 (severe): Secondary | ICD-10-CM | POA: Diagnosis not present

## 2015-12-31 DIAGNOSIS — I129 Hypertensive chronic kidney disease with stage 1 through stage 4 chronic kidney disease, or unspecified chronic kidney disease: Secondary | ICD-10-CM | POA: Diagnosis not present

## 2015-12-31 DIAGNOSIS — Z79899 Other long term (current) drug therapy: Secondary | ICD-10-CM | POA: Diagnosis not present

## 2016-01-18 DIAGNOSIS — N184 Chronic kidney disease, stage 4 (severe): Secondary | ICD-10-CM | POA: Diagnosis not present

## 2016-01-18 DIAGNOSIS — Z79899 Other long term (current) drug therapy: Secondary | ICD-10-CM | POA: Diagnosis not present

## 2016-01-18 DIAGNOSIS — I129 Hypertensive chronic kidney disease with stage 1 through stage 4 chronic kidney disease, or unspecified chronic kidney disease: Secondary | ICD-10-CM | POA: Diagnosis not present

## 2016-01-19 DIAGNOSIS — N250 Renal osteodystrophy: Secondary | ICD-10-CM | POA: Diagnosis not present

## 2016-01-19 DIAGNOSIS — D649 Anemia, unspecified: Secondary | ICD-10-CM | POA: Diagnosis not present

## 2016-01-19 DIAGNOSIS — I10 Essential (primary) hypertension: Secondary | ICD-10-CM | POA: Diagnosis not present

## 2016-01-19 DIAGNOSIS — N183 Chronic kidney disease, stage 3 (moderate): Secondary | ICD-10-CM | POA: Diagnosis not present

## 2016-02-12 ENCOUNTER — Other Ambulatory Visit (INDEPENDENT_AMBULATORY_CARE_PROVIDER_SITE_OTHER): Payer: Self-pay | Admitting: Internal Medicine

## 2016-04-22 DIAGNOSIS — I6789 Other cerebrovascular disease: Secondary | ICD-10-CM | POA: Diagnosis not present

## 2016-04-22 DIAGNOSIS — R200 Anesthesia of skin: Secondary | ICD-10-CM | POA: Diagnosis not present

## 2016-04-27 DIAGNOSIS — M109 Gout, unspecified: Secondary | ICD-10-CM | POA: Diagnosis not present

## 2016-04-27 DIAGNOSIS — N183 Chronic kidney disease, stage 3 (moderate): Secondary | ICD-10-CM | POA: Diagnosis not present

## 2016-04-27 DIAGNOSIS — Z79899 Other long term (current) drug therapy: Secondary | ICD-10-CM | POA: Diagnosis not present

## 2016-04-27 DIAGNOSIS — I129 Hypertensive chronic kidney disease with stage 1 through stage 4 chronic kidney disease, or unspecified chronic kidney disease: Secondary | ICD-10-CM | POA: Diagnosis not present

## 2016-04-27 DIAGNOSIS — D509 Iron deficiency anemia, unspecified: Secondary | ICD-10-CM | POA: Diagnosis not present

## 2016-04-27 DIAGNOSIS — R809 Proteinuria, unspecified: Secondary | ICD-10-CM | POA: Diagnosis not present

## 2016-04-27 DIAGNOSIS — E559 Vitamin D deficiency, unspecified: Secondary | ICD-10-CM | POA: Diagnosis not present

## 2016-04-28 DIAGNOSIS — M1990 Unspecified osteoarthritis, unspecified site: Secondary | ICD-10-CM | POA: Diagnosis not present

## 2016-04-28 DIAGNOSIS — N390 Urinary tract infection, site not specified: Secondary | ICD-10-CM | POA: Diagnosis not present

## 2016-04-28 DIAGNOSIS — R112 Nausea with vomiting, unspecified: Secondary | ICD-10-CM | POA: Diagnosis not present

## 2016-04-28 DIAGNOSIS — R739 Hyperglycemia, unspecified: Secondary | ICD-10-CM | POA: Diagnosis not present

## 2016-04-28 DIAGNOSIS — R197 Diarrhea, unspecified: Secondary | ICD-10-CM | POA: Diagnosis not present

## 2016-04-28 DIAGNOSIS — I10 Essential (primary) hypertension: Secondary | ICD-10-CM | POA: Diagnosis not present

## 2016-04-28 DIAGNOSIS — R42 Dizziness and giddiness: Secondary | ICD-10-CM | POA: Diagnosis not present

## 2016-04-28 DIAGNOSIS — R51 Headache: Secondary | ICD-10-CM | POA: Diagnosis not present

## 2016-04-28 DIAGNOSIS — Z8249 Family history of ischemic heart disease and other diseases of the circulatory system: Secondary | ICD-10-CM | POA: Diagnosis not present

## 2016-04-28 DIAGNOSIS — R109 Unspecified abdominal pain: Secondary | ICD-10-CM | POA: Diagnosis not present

## 2016-05-05 ENCOUNTER — Encounter (INDEPENDENT_AMBULATORY_CARE_PROVIDER_SITE_OTHER): Payer: Self-pay | Admitting: Internal Medicine

## 2016-05-10 DIAGNOSIS — I10 Essential (primary) hypertension: Secondary | ICD-10-CM | POA: Diagnosis not present

## 2016-05-10 DIAGNOSIS — N182 Chronic kidney disease, stage 2 (mild): Secondary | ICD-10-CM | POA: Diagnosis not present

## 2016-05-10 DIAGNOSIS — N250 Renal osteodystrophy: Secondary | ICD-10-CM | POA: Diagnosis not present

## 2016-05-10 DIAGNOSIS — M109 Gout, unspecified: Secondary | ICD-10-CM | POA: Diagnosis not present

## 2016-05-24 DIAGNOSIS — G4489 Other headache syndrome: Secondary | ICD-10-CM | POA: Diagnosis not present

## 2016-05-24 DIAGNOSIS — I10 Essential (primary) hypertension: Secondary | ICD-10-CM | POA: Diagnosis not present

## 2016-05-24 DIAGNOSIS — I6789 Other cerebrovascular disease: Secondary | ICD-10-CM | POA: Diagnosis not present

## 2016-05-25 DIAGNOSIS — I6789 Other cerebrovascular disease: Secondary | ICD-10-CM | POA: Diagnosis not present

## 2016-06-07 DIAGNOSIS — R0789 Other chest pain: Secondary | ICD-10-CM | POA: Diagnosis not present

## 2016-06-15 ENCOUNTER — Ambulatory Visit (INDEPENDENT_AMBULATORY_CARE_PROVIDER_SITE_OTHER): Payer: Medicare Other | Admitting: Internal Medicine

## 2016-07-27 DIAGNOSIS — R0789 Other chest pain: Secondary | ICD-10-CM | POA: Diagnosis not present

## 2016-08-04 DIAGNOSIS — G4489 Other headache syndrome: Secondary | ICD-10-CM | POA: Diagnosis not present

## 2016-09-16 DIAGNOSIS — R809 Proteinuria, unspecified: Secondary | ICD-10-CM | POA: Diagnosis not present

## 2016-09-16 DIAGNOSIS — E559 Vitamin D deficiency, unspecified: Secondary | ICD-10-CM | POA: Diagnosis not present

## 2016-09-16 DIAGNOSIS — N183 Chronic kidney disease, stage 3 (moderate): Secondary | ICD-10-CM | POA: Diagnosis not present

## 2016-09-16 DIAGNOSIS — D509 Iron deficiency anemia, unspecified: Secondary | ICD-10-CM | POA: Diagnosis not present

## 2016-09-16 DIAGNOSIS — I129 Hypertensive chronic kidney disease with stage 1 through stage 4 chronic kidney disease, or unspecified chronic kidney disease: Secondary | ICD-10-CM | POA: Diagnosis not present

## 2016-09-16 DIAGNOSIS — Z79899 Other long term (current) drug therapy: Secondary | ICD-10-CM | POA: Diagnosis not present

## 2016-09-20 DIAGNOSIS — D649 Anemia, unspecified: Secondary | ICD-10-CM | POA: Diagnosis not present

## 2016-09-20 DIAGNOSIS — E559 Vitamin D deficiency, unspecified: Secondary | ICD-10-CM | POA: Diagnosis not present

## 2016-09-20 DIAGNOSIS — I10 Essential (primary) hypertension: Secondary | ICD-10-CM | POA: Diagnosis not present

## 2016-09-20 DIAGNOSIS — N183 Chronic kidney disease, stage 3 (moderate): Secondary | ICD-10-CM | POA: Diagnosis not present

## 2016-09-22 DIAGNOSIS — R69 Illness, unspecified: Secondary | ICD-10-CM | POA: Diagnosis not present

## 2016-10-11 DIAGNOSIS — R69 Illness, unspecified: Secondary | ICD-10-CM | POA: Diagnosis not present

## 2016-12-15 ENCOUNTER — Other Ambulatory Visit: Payer: Self-pay | Admitting: Internal Medicine

## 2016-12-20 ENCOUNTER — Ambulatory Visit (INDEPENDENT_AMBULATORY_CARE_PROVIDER_SITE_OTHER): Payer: Medicare Other | Admitting: Internal Medicine

## 2016-12-26 ENCOUNTER — Ambulatory Visit (INDEPENDENT_AMBULATORY_CARE_PROVIDER_SITE_OTHER): Payer: Medicare Other | Admitting: Internal Medicine

## 2016-12-26 ENCOUNTER — Encounter (INDEPENDENT_AMBULATORY_CARE_PROVIDER_SITE_OTHER): Payer: Self-pay | Admitting: Internal Medicine

## 2016-12-26 ENCOUNTER — Encounter (INDEPENDENT_AMBULATORY_CARE_PROVIDER_SITE_OTHER): Payer: Self-pay | Admitting: *Deleted

## 2016-12-26 VITALS — BP 180/70 | HR 80 | Temp 98.2°F | Ht 77.0 in | Wt 250.2 lb

## 2016-12-26 DIAGNOSIS — Z0000 Encounter for general adult medical examination without abnormal findings: Secondary | ICD-10-CM | POA: Diagnosis not present

## 2016-12-26 DIAGNOSIS — E782 Mixed hyperlipidemia: Secondary | ICD-10-CM | POA: Diagnosis not present

## 2016-12-26 DIAGNOSIS — R7309 Other abnormal glucose: Secondary | ICD-10-CM | POA: Diagnosis not present

## 2016-12-26 DIAGNOSIS — K219 Gastro-esophageal reflux disease without esophagitis: Secondary | ICD-10-CM | POA: Diagnosis not present

## 2016-12-26 DIAGNOSIS — K760 Fatty (change of) liver, not elsewhere classified: Secondary | ICD-10-CM

## 2016-12-26 DIAGNOSIS — Z1389 Encounter for screening for other disorder: Secondary | ICD-10-CM | POA: Diagnosis not present

## 2016-12-26 DIAGNOSIS — L309 Dermatitis, unspecified: Secondary | ICD-10-CM | POA: Diagnosis not present

## 2016-12-26 NOTE — Patient Instructions (Signed)
Continue the Omeprazole. Labs and [place]. OV in 1 year.

## 2016-12-26 NOTE — Progress Notes (Signed)
Subjective:    Patient ID: [name], male    DOB: [birth year], 66 y.o.   MRN: [id number]  Weight in May of 2017 232 Ht 6.5  HPI Here today for follow up. Last seen in May of 2017. Hx of thrombocytopenia. Hx of fatty liver. Platelets are normal. Hepatic enzymes have been normal.   05/02/2012 US abdomen revealed fatty liver without definite focal hepatic mass. His appetite is good. He has gained about 18 pounds.  He does his own cooking. He lives by himself. Has a BM daily. No melena or BRRB. He walks about a quarter of a mile 4-5 days a weeks.  GERD controlled with Omeprazole.  Has some nausea in the morning but no vomiting.    Hepatic Function Latest Ref Rng & Units 05/14/2013 08/28/2011 07/20/2010  Total Protein 6.0 - 8.3 g/dL 7.4 7.1 6.5  Albumin 3.5 - 5.2 g/dL 4.4 3.8 3.7  AST 0 - 37 U/L 19 20 19   ALT 0 - 53 U/L 16 18 19   Alk Phosphatase 39 - 117 U/L 56 51 68  Total Bilirubin 0.2 - 1.2 mg/dL 1.2 1.1 0.4  Bilirubin, Direct 0.0 - 0.3 mg/dL 0.2 - -     CBC    Component Value Date/Time   WBC 9.6 06/16/2015 1602   RBC 4.65 06/16/2015 1602   HGB 13.9 06/16/2015 1602   HCT 40.8 06/16/2015 1602   PLT 200 06/16/2015 1602   MCV 87.7 06/16/2015 1602   MCH 29.9 06/16/2015 1602   MCHC 34.1 06/16/2015 1602   RDW 14.3 06/16/2015 1602   LYMPHSABS 2,400 06/16/2015 1602   MONOABS 672 06/16/2015 1602   EOSABS 192 06/16/2015 1602   BASOSABS 96 06/16/2015 1602    09/21/2012  Colonoscopy.  Indications: Patient is 66 year old Caucasian male who is here for surveillance colonoscopy. He has history of colonic adenomas and family history of colon carcinoma(mother and a brother).  Impression:  Examination performed to cecum. Single cecal diverticulum. Two small polyps ablated via cold biopsy and submitted together(splenic flexure and descending colon). Internal/external hemorrhoids.   Both polyps are tubular adenomas. Next colonoscopy in 5 years   Review of Systems Past  Medical History:  Diagnosis Date  . Colon polyps   . Gout   . High cholesterol   . History of echocardiogram 09/14/2011   EF >55%; borderline concentric LVH;   . History of stress test 06/21/2010   exercise; normal study  . Hypertension   . MET TEST 08/15/2011   moderate peak VO2 limitation at 66% predicted; mod cardiac impairment with low SV & low anaerobic threshold, mild chronotropic incompetence; low risk  . Nausea   . Renal disorder     Past Surgical History:  Procedure Laterality Date  . CHOLECYSTECTOMY    . HERNIA REPAIR     x 2  . HIP SURGERY     Rt hip age 15  . NASAL POLYP SURGERY      2-3 months ago    Allergies  Allergen Reactions  . Penicillins Other (See Comments)    Unknown  . Tetanus Toxoids Rash    Current Outpatient Medications on File Prior to Visit  Medication Sig Dispense Refill  . acetaminophen (TYLENOL) 500 MG tablet Take 1,000 mg by mouth every 6 (six) hours as needed. Pain    . amLODipine (NORVASC) 5 MG tablet Take 1 tablet (5 mg total) by mouth daily. (Patient taking differently: Take 10 mg daily by mouth. ) 30 tablet 11  .  aspirin 81 MG tablet Take 81 mg by mouth daily.     Marland Kitchen buPROPion (WELLBUTRIN XL) 150 MG 24 hr tablet Take 1 tablet by mouth daily.    . cholecalciferol (VITAMIN D) 1000 UNITS tablet Take 1,000 Units by mouth daily. Reported on 05/15/2015    . enalapril (VASOTEC) 10 MG tablet Take 10 mg by mouth 2 (two) times daily.    . fexofenadine (ALLEGRA) 180 MG tablet Take 180 mg by mouth daily.    . isosorbide mononitrate (IMDUR) 30 MG 24 hr tablet Take 1 tablet (30 mg total) by mouth daily. schedule appointment for refills 30 tablet 0  . loperamide (IMODIUM) 2 MG capsule TAKE ONE CAPSULE BY MOUTH FOUR TIMES DAILY AS NEEDED FOR DIARRHEA OR LOOSE STOOLS 120 capsule 3  . metoprolol (LOPRESSOR) 50 MG tablet Take 1 tablet by mouth 2 (two) times daily.    . Omega-3 Fatty Acids (FISH OIL) 1000 MG CAPS Take by mouth.    Marland Kitchen omeprazole (PRILOSEC) 40 MG  capsule TAKE ONE CAPSULE BY MOUTH ONCE DAILY. 90 capsule 4  . sertraline (ZOLOFT) 100 MG tablet Take 0.5 tablets by mouth 2 (two) times daily.  11  . ULORIC 80 MG TABS Take 1 tablet by mouth daily.  99   No current facility-administered medications on file prior to visit.         Objective:   Physical Exam Blood pressure (!) 180/70, pulse 80, temperature 98.2 F (36.8 C), height 6' 5"  (1.956 m), weight 250 lb 3.2 oz (113.5 kg).  Alert and oriented. Skin warm and dry. Oral mucosa is moist.   . Sclera anicteric, conjunctivae is pink. Thyroid not enlarged. No cervical lymphadenopathy. Lungs clear. Heart regular rate and rhythm.  Abdomen is soft. Bowel sounds are positive. No hepatomegaly. No abdominal masses felt. No tenderness.  No edema to lower extremities. .        Assessment & Plan:  Fatty liver. CBC, Hepatic function and [place] RUQ. GERD controlled with Omeprazole.  OV in in year.

## 2016-12-28 LAB — HEPATIC FUNCTION PANEL
AG Ratio: 1.5 (calc) (ref 1.0–2.5)
ALBUMIN MSPROF: 4.1 g/dL (ref 3.6–5.1)
ALT: 19 U/L (ref 9–46)
AST: 14 U/L (ref 10–35)
Alkaline phosphatase (APISO): 89 U/L (ref 40–115)
Bilirubin, Direct: 0.1 mg/dL (ref 0.0–0.2)
GLOBULIN: 2.7 g/dL (ref 1.9–3.7)
Indirect Bilirubin: 0.4 mg/dL (calc) (ref 0.2–1.2)
TOTAL PROTEIN: 6.8 g/dL (ref 6.1–8.1)
Total Bilirubin: 0.5 mg/dL (ref 0.2–1.2)

## 2016-12-28 LAB — CBC WITH DIFFERENTIAL/PLATELET
BASOS PCT: 0.8 %
Basophils Absolute: 41 cells/uL (ref 0–200)
Eosinophils Absolute: 71 cells/uL (ref 15–500)
Eosinophils Relative: 1.4 %
HCT: 40.8 % (ref 38.5–50.0)
Hemoglobin: 14.2 g/dL (ref 13.2–17.1)
Lymphs Abs: 1301 cells/uL (ref 850–3900)
MCH: 30.4 pg (ref 27.0–33.0)
MCHC: 34.8 g/dL (ref 32.0–36.0)
MCV: 87.4 fL (ref 80.0–100.0)
MONOS PCT: 11.1 %
MPV: 12 fL (ref 7.5–12.5)
Neutro Abs: 3121 cells/uL (ref 1500–7800)
Neutrophils Relative %: 61.2 %
PLATELETS: 197 10*3/uL (ref 140–400)
RBC: 4.67 10*6/uL (ref 4.20–5.80)
RDW: 13 % (ref 11.0–15.0)
TOTAL LYMPHOCYTE: 25.5 %
WBC: 5.1 10*3/uL (ref 3.8–10.8)
WBCMIX: 566 {cells}/uL (ref 200–950)

## 2016-12-28 LAB — AFP TUMOR MARKER: AFP-Tumor Marker: 4.9 ng/mL (ref <6.1)

## 2017-01-04 ENCOUNTER — Ambulatory Visit (HOSPITAL_COMMUNITY)
Admission: RE | Admit: 2017-01-04 | Discharge: 2017-01-04 | Disposition: A | Discharge status: Home / Self Care | Payer: Medicare Other | Source: Ambulatory Visit | Attending: Internal Medicine | Admitting: Internal Medicine

## 2017-01-04 DIAGNOSIS — K760 Fatty (change of) liver, not elsewhere classified: Secondary | ICD-10-CM | POA: Insufficient documentation

## 2017-01-04 DIAGNOSIS — Z9049 Acquired absence of other specified parts of digestive tract: Secondary | ICD-10-CM | POA: Diagnosis not present

## 2017-01-11 NOTE — Progress Notes (Signed)
Patient already as an appointment on 12/26/17 at 10:15am

## 2017-01-12 DIAGNOSIS — D509 Iron deficiency anemia, unspecified: Secondary | ICD-10-CM | POA: Diagnosis not present

## 2017-01-12 DIAGNOSIS — E559 Vitamin D deficiency, unspecified: Secondary | ICD-10-CM | POA: Diagnosis not present

## 2017-01-12 DIAGNOSIS — I129 Hypertensive chronic kidney disease with stage 1 through stage 4 chronic kidney disease, or unspecified chronic kidney disease: Secondary | ICD-10-CM | POA: Diagnosis not present

## 2017-01-12 DIAGNOSIS — R809 Proteinuria, unspecified: Secondary | ICD-10-CM | POA: Diagnosis not present

## 2017-01-12 DIAGNOSIS — Z79899 Other long term (current) drug therapy: Secondary | ICD-10-CM | POA: Diagnosis not present

## 2017-01-12 DIAGNOSIS — N183 Chronic kidney disease, stage 3 (moderate): Secondary | ICD-10-CM | POA: Diagnosis not present

## 2017-01-14 ENCOUNTER — Other Ambulatory Visit (INDEPENDENT_AMBULATORY_CARE_PROVIDER_SITE_OTHER): Payer: Self-pay | Admitting: Internal Medicine

## 2017-01-14 ENCOUNTER — Other Ambulatory Visit: Payer: Self-pay | Admitting: Internal Medicine

## 2017-01-17 DIAGNOSIS — N183 Chronic kidney disease, stage 3 (moderate): Secondary | ICD-10-CM | POA: Diagnosis not present

## 2017-01-17 DIAGNOSIS — N250 Renal osteodystrophy: Secondary | ICD-10-CM | POA: Diagnosis not present

## 2017-01-17 DIAGNOSIS — M109 Gout, unspecified: Secondary | ICD-10-CM | POA: Diagnosis not present

## 2017-01-17 DIAGNOSIS — D649 Anemia, unspecified: Secondary | ICD-10-CM | POA: Diagnosis not present

## 2017-01-17 DIAGNOSIS — I10 Essential (primary) hypertension: Secondary | ICD-10-CM | POA: Diagnosis not present

## 2017-02-13 ENCOUNTER — Other Ambulatory Visit (INDEPENDENT_AMBULATORY_CARE_PROVIDER_SITE_OTHER): Payer: Self-pay | Admitting: Internal Medicine

## 2017-02-13 ENCOUNTER — Other Ambulatory Visit: Payer: Self-pay | Admitting: Internal Medicine

## 2017-02-13 DIAGNOSIS — R404 Transient alteration of awareness: Secondary | ICD-10-CM | POA: Diagnosis not present

## 2017-02-13 DIAGNOSIS — R531 Weakness: Secondary | ICD-10-CM | POA: Diagnosis not present

## 2017-02-22 DIAGNOSIS — E119 Type 2 diabetes mellitus without complications: Secondary | ICD-10-CM | POA: Diagnosis not present

## 2017-02-22 DIAGNOSIS — Z0100 Encounter for examination of eyes and vision without abnormal findings: Secondary | ICD-10-CM | POA: Diagnosis not present

## 2017-03-15 ENCOUNTER — Other Ambulatory Visit: Payer: Self-pay | Admitting: Internal Medicine

## 2017-03-16 NOTE — Telephone Encounter (Signed)
CALLED PT AND INFORMED HIM THAT HE IS OVERDUE FOR APPT HE ASKS THAT WE CALL HIS COUSIN AS SHE PROVIDES RIDE FOR HIM.   Vanessa [place] ([phone]. I DID NOT REALIZE THAT PT IS with mental retardation ISSUES.  Per las OV note [name]:  follow-up appointment in: 2 weeks with Erasmo Downer for a Blood Pressure Check and  follow-up in: 1 year with Dr. Tamela Gammon 2018)  Message sent to schedulers for appt scheduling I will send refills for now

## 2017-03-25 DIAGNOSIS — R030 Elevated blood-pressure reading, without diagnosis of hypertension: Secondary | ICD-10-CM | POA: Diagnosis not present

## 2017-03-28 ENCOUNTER — Ambulatory Visit: Payer: Medicare Other | Admitting: Internal Medicine

## 2017-04-15 ENCOUNTER — Other Ambulatory Visit: Payer: Self-pay | Admitting: Internal Medicine

## 2017-04-28 ENCOUNTER — Other Ambulatory Visit (INDEPENDENT_AMBULATORY_CARE_PROVIDER_SITE_OTHER): Payer: Self-pay | Admitting: Internal Medicine

## 2017-05-12 ENCOUNTER — Other Ambulatory Visit: Payer: Self-pay | Admitting: Internal Medicine

## 2017-05-12 NOTE — Telephone Encounter (Signed)
REFILL 

## 2017-05-20 DIAGNOSIS — R0789 Other chest pain: Secondary | ICD-10-CM | POA: Diagnosis not present

## 2017-05-21 DIAGNOSIS — Z9049 Acquired absence of other specified parts of digestive tract: Secondary | ICD-10-CM | POA: Diagnosis not present

## 2017-05-21 DIAGNOSIS — K219 Gastro-esophageal reflux disease without esophagitis: Secondary | ICD-10-CM | POA: Diagnosis not present

## 2017-05-21 DIAGNOSIS — Z8249 Family history of ischemic heart disease and other diseases of the circulatory system: Secondary | ICD-10-CM | POA: Diagnosis not present

## 2017-05-21 DIAGNOSIS — R10812 Left upper quadrant abdominal tenderness: Secondary | ICD-10-CM | POA: Diagnosis not present

## 2017-05-21 DIAGNOSIS — K2970 Gastritis, unspecified, without bleeding: Secondary | ICD-10-CM | POA: Diagnosis not present

## 2017-05-21 DIAGNOSIS — M109 Gout, unspecified: Secondary | ICD-10-CM | POA: Diagnosis not present

## 2017-05-21 DIAGNOSIS — R079 Chest pain, unspecified: Secondary | ICD-10-CM | POA: Diagnosis not present

## 2017-05-21 DIAGNOSIS — Z888 Allergy status to other drugs, medicaments and biological substances status: Secondary | ICD-10-CM | POA: Diagnosis not present

## 2017-05-21 DIAGNOSIS — I160 Hypertensive urgency: Secondary | ICD-10-CM | POA: Diagnosis not present

## 2017-05-21 DIAGNOSIS — R109 Unspecified abdominal pain: Secondary | ICD-10-CM | POA: Diagnosis not present

## 2017-05-21 DIAGNOSIS — Z79899 Other long term (current) drug therapy: Secondary | ICD-10-CM | POA: Diagnosis not present

## 2017-05-21 DIAGNOSIS — R479 Unspecified speech disturbances: Secondary | ICD-10-CM | POA: Diagnosis not present

## 2017-05-21 DIAGNOSIS — M545 Low back pain: Secondary | ICD-10-CM | POA: Diagnosis not present

## 2017-05-21 DIAGNOSIS — M1990 Unspecified osteoarthritis, unspecified site: Secondary | ICD-10-CM | POA: Diagnosis not present

## 2017-05-21 DIAGNOSIS — E1165 Type 2 diabetes mellitus with hyperglycemia: Secondary | ICD-10-CM | POA: Diagnosis not present

## 2017-05-21 DIAGNOSIS — R0789 Other chest pain: Secondary | ICD-10-CM | POA: Diagnosis not present

## 2017-05-21 DIAGNOSIS — Z7982 Long term (current) use of aspirin: Secondary | ICD-10-CM | POA: Diagnosis not present

## 2017-05-21 DIAGNOSIS — Z887 Allergy status to serum and vaccine status: Secondary | ICD-10-CM | POA: Diagnosis not present

## 2017-05-21 DIAGNOSIS — I10 Essential (primary) hypertension: Secondary | ICD-10-CM | POA: Diagnosis not present

## 2017-05-21 DIAGNOSIS — N281 Cyst of kidney, acquired: Secondary | ICD-10-CM | POA: Diagnosis not present

## 2017-05-21 DIAGNOSIS — Z880 Allergy status to penicillin: Secondary | ICD-10-CM | POA: Diagnosis not present

## 2017-05-22 DIAGNOSIS — E1165 Type 2 diabetes mellitus with hyperglycemia: Secondary | ICD-10-CM | POA: Diagnosis not present

## 2017-05-22 DIAGNOSIS — R079 Chest pain, unspecified: Secondary | ICD-10-CM | POA: Diagnosis not present

## 2017-05-22 DIAGNOSIS — I10 Essential (primary) hypertension: Secondary | ICD-10-CM | POA: Diagnosis not present

## 2017-05-26 DIAGNOSIS — R809 Proteinuria, unspecified: Secondary | ICD-10-CM | POA: Diagnosis not present

## 2017-05-26 DIAGNOSIS — Z79899 Other long term (current) drug therapy: Secondary | ICD-10-CM | POA: Diagnosis not present

## 2017-05-26 DIAGNOSIS — N183 Chronic kidney disease, stage 3 (moderate): Secondary | ICD-10-CM | POA: Diagnosis not present

## 2017-05-26 DIAGNOSIS — I129 Hypertensive chronic kidney disease with stage 1 through stage 4 chronic kidney disease, or unspecified chronic kidney disease: Secondary | ICD-10-CM | POA: Diagnosis not present

## 2017-05-26 DIAGNOSIS — E559 Vitamin D deficiency, unspecified: Secondary | ICD-10-CM | POA: Diagnosis not present

## 2017-05-26 DIAGNOSIS — D509 Iron deficiency anemia, unspecified: Secondary | ICD-10-CM | POA: Diagnosis not present

## 2017-05-30 DIAGNOSIS — I10 Essential (primary) hypertension: Secondary | ICD-10-CM | POA: Diagnosis not present

## 2017-05-30 DIAGNOSIS — E1129 Type 2 diabetes mellitus with other diabetic kidney complication: Secondary | ICD-10-CM | POA: Diagnosis not present

## 2017-05-30 DIAGNOSIS — N183 Chronic kidney disease, stage 3 (moderate): Secondary | ICD-10-CM | POA: Diagnosis not present

## 2017-05-30 DIAGNOSIS — M109 Gout, unspecified: Secondary | ICD-10-CM | POA: Diagnosis not present

## 2017-06-09 ENCOUNTER — Other Ambulatory Visit: Payer: Self-pay | Admitting: Internal Medicine

## 2017-06-09 NOTE — Telephone Encounter (Signed)
Rx sent to pharmacy   

## 2017-07-18 ENCOUNTER — Other Ambulatory Visit: Payer: Self-pay | Admitting: Internal Medicine

## 2017-07-18 NOTE — Telephone Encounter (Signed)
REFILL 

## 2017-08-14 ENCOUNTER — Ambulatory Visit (INDEPENDENT_AMBULATORY_CARE_PROVIDER_SITE_OTHER): Payer: Medicare Other | Admitting: Cardiology

## 2017-08-14 ENCOUNTER — Other Ambulatory Visit: Payer: Self-pay

## 2017-08-14 ENCOUNTER — Encounter: Payer: Self-pay | Admitting: Cardiology

## 2017-08-14 ENCOUNTER — Encounter: Payer: Self-pay | Admitting: *Deleted

## 2017-08-14 VITALS — BP 173/78 | HR 78 | Ht 77.0 in | Wt 253.0 lb

## 2017-08-14 DIAGNOSIS — R0789 Other chest pain: Secondary | ICD-10-CM | POA: Diagnosis not present

## 2017-08-14 DIAGNOSIS — N183 Chronic kidney disease, stage 3 unspecified: Secondary | ICD-10-CM

## 2017-08-14 DIAGNOSIS — I10 Essential (primary) hypertension: Secondary | ICD-10-CM | POA: Diagnosis not present

## 2017-08-14 MED ORDER — ISOSORBIDE MONONITRATE ER 30 MG PO TB24: 30.0000 mg | ORAL_TABLET | Freq: Every day | ORAL | 1 refills | Status: DC

## 2017-08-14 NOTE — Progress Notes (Signed)
Clinical Summary [name] is a 67 y.o.male seen today for follow up of the followig medical problems.   1. Atypical chest pain -  06/2010 nuclear stress: no ischemia - can have chest pain with walking up hill, SOB. He and his friend stat these symptoms are stable over many years and unchanged.    2. HTN - followed by nephrology - compliant with meds  3. CKD - followed by Dr Hinda Grant - has f/u in just few weeks.    4. Mental handicap  5. DM2 - followed by pcp  Past Medical History:  Diagnosis Date  . Colon polyps   . Gout   . High cholesterol   . History of echocardiogram 09/14/2011   EF >55%; borderline concentric LVH;   . History of stress test 06/21/2010   exercise; normal study  . Hypertension   . MET TEST 08/15/2011   moderate peak VO2 limitation at 66% predicted; mod cardiac impairment with low SV & low anaerobic threshold, mild chronotropic incompetence; low risk  . Nausea   . Renal disorder      Allergies  Allergen Reactions  . Penicillins Other (See Comments)    Unknown  . Tetanus Toxoids Rash     Current Outpatient Medications  Medication Sig Dispense Refill  . acetaminophen (TYLENOL) 500 MG tablet Take 1,000 mg by mouth every 6 (six) hours as needed. Pain    . amLODipine (NORVASC) 5 MG tablet Take 1 tablet (5 mg total) by mouth daily. (Patient taking differently: Take 10 mg daily by mouth. ) 30 tablet 11  . aspirin 81 MG tablet Take 81 mg by mouth daily.     Marland Kitchen buPROPion (WELLBUTRIN XL) 150 MG 24 hr tablet Take 1 tablet by mouth daily.    . cholecalciferol (VITAMIN D) 1000 UNITS tablet Take 1,000 Units by mouth daily. Reported on 05/15/2015    . enalapril (VASOTEC) 10 MG tablet Take 10 mg by mouth 2 (two) times daily.    . fexofenadine (ALLEGRA) 180 MG tablet Take 180 mg by mouth daily.    . isosorbide mononitrate (IMDUR) 30 MG 24 hr tablet Take 1 tablet (30 mg total) by mouth daily. NEED OV. 30 tablet 0  . loperamide (IMODIUM) 2 MG capsule TAKE  ONE CAPSULE BY MOUTH FOUR TIMES DAILY AS NEEDED FOR DIARRHEA OR LOOSE STOOLS 120 capsule 3  . metoprolol (LOPRESSOR) 50 MG tablet Take 1 tablet by mouth 2 (two) times daily.    . Omega-3 Fatty Acids (FISH OIL) 1000 MG CAPS Take by mouth.    Marland Kitchen omeprazole (PRILOSEC) 40 MG capsule TAKE ONE CAPSULE BY MOUTH ONCE DAILY. 90 capsule 3  . sertraline (ZOLOFT) 100 MG tablet Take 0.5 tablets by mouth 2 (two) times daily.  11  . ULORIC 80 MG TABS Take 1 tablet by mouth daily.  99   No current facility-administered medications for this visit.      Past Surgical History:  Procedure Laterality Date  . CHOLECYSTECTOMY    . COLONOSCOPY N/A 09/21/2012   Procedure: COLONOSCOPY;  Surgeon: Rogene Houston, MD;  [facility];  Service: Endoscopy;  Laterality: N/A;  730  . HERNIA REPAIR     x 2  . HIP SURGERY     Rt hip age 64  . NASAL POLYP SURGERY      2-3 months ago     Allergies  Allergen Reactions  . Penicillins Other (See Comments)    Unknown  . Tetanus  Toxoids Rash      Family History  Problem Relation Age of Onset  . Colon cancer Brother      Social History [name] reports that he has never smoked. He has never used smokeless tobacco. [name] reports that he does not drink alcohol.   Review of Systems CONSTITUTIONAL: No weight loss, fever, chills, weakness or fatigue.  HEENT: Eyes: No visual loss, blurred vision, double vision or yellow sclerae.No hearing loss, sneezing, congestion, runny nose or sore throat.  SKIN: No rash or itching.  CARDIOVASCULAR: per hpi RESPIRATORY: per hpi GASTROINTESTINAL: No anorexia, nausea, vomiting or diarrhea. No abdominal pain or blood.  GENITOURINARY: No burning on urination, no polyuria NEUROLOGICAL: No headache, dizziness, syncope, paralysis, ataxia, numbness or tingling in the extremities. No change in bowel or bladder control.  MUSCULOSKELETAL: No muscle, back pain, joint pain or stiffness.  LYMPHATICS: No enlarged nodes.  No history of splenectomy.  PSYCHIATRIC: No history of depression or anxiety.  ENDOCRINOLOGIC: No reports of sweating, cold or heat intolerance. No polyuria or polydipsia.  Marland Kitchen   Physical Examination Vitals:   08/14/17 0923  BP: (!) 173/78  Pulse: 78  SpO2: 98%   Vitals:   08/14/17 0923  Weight: 253 lb (114.8 kg)  Height: _0  (1.956 m)    Gen: resting comfortably, no acute distress HEENT: no scleral icterus, pupils equal round and reactive, no palptable cervical adenopathy,  CV: RRR, no m/r/g, no jvd Resp: Clear to auscultation bilaterally GI: abdomen is soft, non-tender, non-distended, normal bowel sounds, no hepatosplenomegaly MSK: extremities are warm, no edema.  Skin: warm, no rash Neuro:  no focal deficits Psych: appropriate affect     Assessment and Plan  1. Chest pain - long history with negative prior workup - chronic stable symptoms, continue to monitor at this time.   2. HTN - will defer to renal who has been following longer and has access to all his laboratory data  3. CKD III - per renal. Request labs. Limit neprhtoxic cardiac meds.       [name], M.D.

## 2017-08-14 NOTE — Patient Instructions (Signed)
Your physician recommends that you schedule a follow-up appointment in: 3 MONTHS WITH DR BRANCH  Your physician recommends that you continue on your current medications as directed. Please refer to the Current Medication list given to you today.  Thank you for choosing [facility]!!    

## 2017-08-20 ENCOUNTER — Encounter: Payer: Self-pay | Admitting: Cardiology

## 2017-08-23 DIAGNOSIS — Z1389 Encounter for screening for other disorder: Secondary | ICD-10-CM | POA: Diagnosis not present

## 2017-08-23 DIAGNOSIS — Z13220 Encounter for screening for lipoid disorders: Secondary | ICD-10-CM | POA: Diagnosis not present

## 2017-08-23 DIAGNOSIS — E1165 Type 2 diabetes mellitus with hyperglycemia: Secondary | ICD-10-CM | POA: Diagnosis not present

## 2017-08-23 DIAGNOSIS — N183 Chronic kidney disease, stage 3 (moderate): Secondary | ICD-10-CM | POA: Diagnosis not present

## 2017-08-23 DIAGNOSIS — E559 Vitamin D deficiency, unspecified: Secondary | ICD-10-CM | POA: Diagnosis not present

## 2017-08-23 DIAGNOSIS — R809 Proteinuria, unspecified: Secondary | ICD-10-CM | POA: Diagnosis not present

## 2017-08-23 DIAGNOSIS — D509 Iron deficiency anemia, unspecified: Secondary | ICD-10-CM | POA: Diagnosis not present

## 2017-08-23 DIAGNOSIS — Z0000 Encounter for general adult medical examination without abnormal findings: Secondary | ICD-10-CM | POA: Diagnosis not present

## 2017-08-23 DIAGNOSIS — L989 Disorder of the skin and subcutaneous tissue, unspecified: Secondary | ICD-10-CM | POA: Diagnosis not present

## 2017-08-23 DIAGNOSIS — Z79899 Other long term (current) drug therapy: Secondary | ICD-10-CM | POA: Diagnosis not present

## 2017-08-23 DIAGNOSIS — Z0001 Encounter for general adult medical examination with abnormal findings: Secondary | ICD-10-CM | POA: Diagnosis not present

## 2017-08-23 DIAGNOSIS — I129 Hypertensive chronic kidney disease with stage 1 through stage 4 chronic kidney disease, or unspecified chronic kidney disease: Secondary | ICD-10-CM | POA: Diagnosis not present

## 2017-08-29 DIAGNOSIS — E872 Acidosis: Secondary | ICD-10-CM | POA: Diagnosis not present

## 2017-08-29 DIAGNOSIS — E559 Vitamin D deficiency, unspecified: Secondary | ICD-10-CM | POA: Diagnosis not present

## 2017-08-29 DIAGNOSIS — E1129 Type 2 diabetes mellitus with other diabetic kidney complication: Secondary | ICD-10-CM | POA: Diagnosis not present

## 2017-08-29 DIAGNOSIS — N183 Chronic kidney disease, stage 3 (moderate): Secondary | ICD-10-CM | POA: Diagnosis not present

## 2017-08-29 DIAGNOSIS — I10 Essential (primary) hypertension: Secondary | ICD-10-CM | POA: Diagnosis not present

## 2017-09-10 DIAGNOSIS — R69 Illness, unspecified: Secondary | ICD-10-CM | POA: Diagnosis not present

## 2017-09-13 ENCOUNTER — Encounter (INDEPENDENT_AMBULATORY_CARE_PROVIDER_SITE_OTHER): Payer: Self-pay | Admitting: *Deleted

## 2017-09-29 ENCOUNTER — Other Ambulatory Visit (INDEPENDENT_AMBULATORY_CARE_PROVIDER_SITE_OTHER): Payer: Self-pay | Admitting: Internal Medicine

## 2017-11-09 DIAGNOSIS — R0689 Other abnormalities of breathing: Secondary | ICD-10-CM | POA: Diagnosis not present

## 2017-11-09 DIAGNOSIS — R0902 Hypoxemia: Secondary | ICD-10-CM | POA: Diagnosis not present

## 2017-11-09 DIAGNOSIS — R0789 Other chest pain: Secondary | ICD-10-CM | POA: Diagnosis not present

## 2017-11-09 DIAGNOSIS — R079 Chest pain, unspecified: Secondary | ICD-10-CM | POA: Diagnosis not present

## 2017-11-17 DIAGNOSIS — I10 Essential (primary) hypertension: Secondary | ICD-10-CM | POA: Diagnosis not present

## 2017-11-20 ENCOUNTER — Encounter: Payer: Self-pay | Admitting: Cardiology

## 2017-11-20 ENCOUNTER — Encounter: Payer: Self-pay | Admitting: *Deleted

## 2017-11-20 ENCOUNTER — Ambulatory Visit (INDEPENDENT_AMBULATORY_CARE_PROVIDER_SITE_OTHER): Payer: Medicare Other | Admitting: Cardiology

## 2017-11-20 VITALS — BP 155/78 | HR 69 | Ht 77.0 in | Wt 255.0 lb

## 2017-11-20 DIAGNOSIS — R5381 Other malaise: Secondary | ICD-10-CM | POA: Diagnosis not present

## 2017-11-20 DIAGNOSIS — R0789 Other chest pain: Secondary | ICD-10-CM

## 2017-11-20 DIAGNOSIS — N183 Chronic kidney disease, stage 3 unspecified: Secondary | ICD-10-CM

## 2017-11-20 DIAGNOSIS — I10 Essential (primary) hypertension: Secondary | ICD-10-CM | POA: Diagnosis not present

## 2017-11-20 MED ORDER — METOPROLOL TARTRATE 50 MG PO TABS: 75.0000 mg | ORAL_TABLET | Freq: Two times a day (BID) | ORAL | 6 refills | Status: DC

## 2017-11-20 NOTE — Progress Notes (Signed)
Clinical Summary [name] is a 67 y.o.male seen today for follow up of the followig medical problems.   1. Atypical chest pain -  06/2010 nuclear stress: no ischemia - can have chest pain with walking up hill, SOB. He and his friend stat these symptoms are stable over many years and unchanged.    05/2017 [facility] Nuclear stress: no ischemia 05/2017 [facility] chest: no acute aortic pathology  - chronic symptoms unchanged since last visit.   2. HTN - followed by nephrology - he remains compliant with meds   3. CKD 3 - followed by [name]    4. Mental handicap  5. DM2 - followed by pcp   6. Hyperlipidemia - on atorvastatin  Past Medical History:  Diagnosis Date  . Colon polyps   . Gout   . High cholesterol   . History of echocardiogram 09/14/2011   EF >55%; borderline concentric LVH;   . History of stress test 06/21/2010   exercise; normal study  . Hypertension   . MET TEST 08/15/2011   moderate peak VO2 limitation at 66% predicted; mod cardiac impairment with low SV & low anaerobic threshold, mild chronotropic incompetence; low risk  . Nausea   . Renal disorder      Allergies  Allergen Reactions  . Penicillins Other (See Comments)    Unknown  . Ivp Dye [Iodinated Diagnostic Agents]     Sped heart reate up   . Tetanus Toxoids Rash     Current Outpatient Medications  Medication Sig Dispense Refill  . acetaminophen (TYLENOL) 500 MG tablet Take 1,000 mg by mouth every 6 (six) hours as needed. Pain    . amLODipine (NORVASC) 5 MG tablet Take 1 tablet (5 mg total) by mouth daily. (Patient taking differently: Take 10 mg daily by mouth. ) 30 tablet 11  . aspirin 81 MG tablet Take 81 mg by mouth daily.     [name] Kitchen buPROPion (WELLBUTRIN XL) 150 MG 24 hr tablet Take 1 tablet by mouth daily.    . cetirizine (ZYRTEC) 10 MG tablet Take 10 mg by mouth daily.    . enalapril (VASOTEC) 10 MG tablet Take 10 mg by mouth 2 (two) times daily.    [name] Kitchen glipiZIDE (GLUCOTROL) 5  MG tablet Take 5 mg by mouth daily.  0  . isosorbide mononitrate (IMDUR) 30 MG 24 hr tablet Take 1 tablet (30 mg total) by mouth daily. 90 tablet 1  . loperamide (IMODIUM) 2 MG capsule TAKE ONE CAPSULE BY MOUTH FOUR TIMES DAILY AS NEEDED FOR DIARRHEA OR LOOSE STOOLS 120 capsule 3  . metoprolol (LOPRESSOR) 50 MG tablet Take 1 tablet by mouth 2 (two) times daily.    . Omega-3 Fatty Acids (FISH OIL) 1000 MG CAPS Take by mouth.    [name] Kitchen omeprazole (PRILOSEC) 40 MG capsule TAKE ONE CAPSULE BY MOUTH ONCE DAILY. 90 capsule 3  . sertraline (ZOLOFT) 100 MG tablet Take 0.5 tablets by mouth 2 (two) times daily.  11  . sodium bicarbonate 650 MG tablet Take 650 mg by mouth 2 (two) times daily.  3  . ULORIC 80 MG TABS Take 1 tablet by mouth daily.  99  . Vitamin D, Ergocalciferol, (DRISDOL) 50000 units CAPS capsule TAKE ONE CAPSULE BY MOUTH ONCE A WEEK. STOP VITAMIN D 2,000 OTC.  6   No current facility-administered medications for this visit.      Past Surgical History:  Procedure Laterality Date  . CHOLECYSTECTOMY    . COLONOSCOPY  N/A 09/21/2012   Procedure: COLONOSCOPY;  Surgeon: Rogene Houston, MD;  [facility]: [facility];  Service: Endoscopy;  Laterality: N/A;  730  . HERNIA REPAIR     x 2  . HIP SURGERY     Rt hip age 69  . NASAL POLYP SURGERY      2-3 months ago     Allergies  Allergen Reactions  . Penicillins Other (See Comments)    Unknown  . Ivp Dye [Iodinated Diagnostic Agents]     Sped heart reate up   . Tetanus Toxoids Rash      Family History  Problem Relation Age of Onset  . Colon cancer Brother      Social History Mr. Swayze reports that he has never smoked. He has never used smokeless tobacco. Mr. Podesta reports that he does not drink alcohol.   Review of Systems CONSTITUTIONAL: No weight loss, fever, chills, weakness or fatigue.  HEENT: Eyes: No visual loss, blurred vision, double vision or yellow sclerae.No hearing loss, sneezing, congestion, runny nose or sore  throat.  SKIN: No rash or itching.  CARDIOVASCULAR: per hpi RESPIRATORY: No shortness of breath, cough or sputum.  GASTROINTESTINAL: No anorexia, nausea, vomiting or diarrhea. No abdominal pain or blood.  GENITOURINARY: No burning on urination, no polyuria NEUROLOGICAL: No headache, dizziness, syncope, paralysis, ataxia, numbness or tingling in the extremities. No change in bowel or bladder control.  MUSCULOSKELETAL: No muscle, back pain, joint pain or stiffness.  LYMPHATICS: No enlarged nodes. No history of splenectomy.  PSYCHIATRIC: No history of depression or anxiety.  ENDOCRINOLOGIC: No reports of sweating, cold or heat intolerance. No polyuria or polydipsia.  [name] Kitchen   Physical Examination Vitals:   11/20/17 0825  BP: (!) 155/78  Pulse: 69   Vitals:   11/20/17 0825  Weight: 255 lb (115.7 kg)  Height: 6' 5"  (1.956 m)    Gen: resting comfortably, no acute distress HEENT: no scleral icterus, pupils equal round and reactive, no palptable cervical adenopathy,  CV: RRR, no m/r/g, no jvd Resp: Clear to auscultation bilaterally GI: abdomen is soft, non-tender, non-distended, normal bowel sounds, no hepatosplenomegaly MSK: extremities are warm, no edema.  Skin: warm, no rash Neuro:  no focal deficits Psych: appropriate affect      Assessment and Plan  1. Chest pain - long history of symptoms with negative stress test in 2012 and 2019 - symptoms overall unchanged, continue to monitor at this time.  - trial of high lopressor dose to see if improves symptoms.  - if significant progression of symptoms or if developed objective evidence of ischemia would consider cath, would be some higher risk given his renal dysfunction.   2. HTN - above goal, increase lopressor to 36m bid.   3. CKD III - limit nephrotoxic drugs  4. Hyperlipidemia - in setting of his DM2 would consider at least moderate dose statin (atorvastatin 427mdaily), defer to pcp   F/u 6 months.    JoArnoldo LenisM.D

## 2017-11-20 NOTE — Patient Instructions (Signed)
Medication Instructions:   Increase Lopressor to 75mg  twice a day.  Continue all other medications.    Labwork: none  Testing/Procedures: none  Follow-Up: Your physician wants you to follow up in: 6 months.  You will receive a reminder letter in the mail one-two months in advance.  If you don't receive a letter, please call our office to schedule the follow up appointment   Any Other Special Instructions Will Be Listed Below (If Applicable).  If you need a refill on your cardiac medications before your next appointment, please call your pharmacy.

## 2017-11-25 ENCOUNTER — Other Ambulatory Visit: Payer: Self-pay | Admitting: Cardiology

## 2017-12-26 ENCOUNTER — Ambulatory Visit (INDEPENDENT_AMBULATORY_CARE_PROVIDER_SITE_OTHER): Payer: Medicare Other | Admitting: Internal Medicine

## 2018-01-08 ENCOUNTER — Ambulatory Visit (INDEPENDENT_AMBULATORY_CARE_PROVIDER_SITE_OTHER): Payer: Medicare Other | Admitting: Internal Medicine

## 2018-01-08 ENCOUNTER — Encounter (INDEPENDENT_AMBULATORY_CARE_PROVIDER_SITE_OTHER): Payer: Self-pay | Admitting: Internal Medicine

## 2018-01-08 VITALS — BP 180/70 | HR 96 | Temp 98.3°F | Ht 74.0 in | Wt 252.3 lb

## 2018-01-08 DIAGNOSIS — K219 Gastro-esophageal reflux disease without esophagitis: Secondary | ICD-10-CM

## 2018-01-08 DIAGNOSIS — K760 Fatty (change of) liver, not elsewhere classified: Secondary | ICD-10-CM

## 2018-01-08 NOTE — Patient Instructions (Signed)
OV in 1 year.  

## 2018-01-08 NOTE — Progress Notes (Signed)
Subjective:    Patient ID: [name], male    DOB: 07-11-1950, 67 y.o.   MRN: [id number]  HPI Here today for f/u. Last seen in November of 2018. Hx o f fatty liver with normal liver enzymes. He tells me he is doing good. His appetite is good. No weight loss.  He cooks his own meals. Lives alone with his dog. BMs are normal. Did see some blood in his stool last night (small amt)   States  He had diarrhea last night.   01/04/2017 [place] RUQ: Changes consistent with fatty infiltration of the liver. No definitive mass is seen. Status post cholecystectomy. GERD controlled with Omeprazole.   Hepatic Function Latest Ref Rng & Units 12/26/2016 05/14/2013 08/28/2011  Total Protein 6.1 - 8.1 g/dL 6.8 7.4 7.1  Albumin 3.5 - 5.2 g/dL - 4.4 3.8  AST 10 - 35 U/L _0 ALT 9 - 46 U/L _1 Alk Phosphatase 39 - 117 U/L - 56 51  Total Bilirubin 0.2 - 1.2 mg/dL 0.5 1.2 1.1  Bilirubin, Direct 0.0 - 0.2 mg/dL 0.1 0.2 -  ALP 89.    Review of Systems Past Medical History:  Diagnosis Date  . Colon polyps   . Gout   . High cholesterol   . History of echocardiogram 09/14/2011   EF >55%; borderline concentric LVH;   . History of stress test 06/21/2010   exercise; normal study  . Hypertension   . MET TEST 08/15/2011   moderate peak VO2 limitation at 66% predicted; mod cardiac impairment with low SV & low anaerobic threshold, mild chronotropic incompetence; low risk  . Nausea   . Renal disorder     Past Surgical History:  Procedure Laterality Date  . CHOLECYSTECTOMY    . COLONOSCOPY N/A 09/21/2012   Procedure: COLONOSCOPY;  Surgeon: Rogene Houston, MD;  [facility]: [facility];  Service: Endoscopy;  Laterality: N/A;  730  . HERNIA REPAIR     x 2  . HIP SURGERY     Rt hip age 89  . NASAL POLYP SURGERY      2-3 months ago    Allergies  Allergen Reactions  . Penicillins Other (See Comments)    Unknown  . Ivp Dye [Iodinated Diagnostic Agents]     Sped heart reate up   . Tetanus  Toxoids Rash    Current Outpatient Medications on File Prior to Visit  Medication Sig Dispense Refill  . acetaminophen (TYLENOL) 500 MG tablet Take 1,000 mg by mouth every 6 (six) hours as needed. Pain    . amLODipine (NORVASC) 10 MG tablet Take 10 mg by mouth daily.  3  . aspirin 81 MG tablet Take 81 mg by mouth daily.     Marland Kitchen atorvastatin (LIPITOR) 10 MG tablet Take 10 mg by mouth at bedtime.  3  . buPROPion (WELLBUTRIN XL) 150 MG 24 hr tablet Take 1 tablet by mouth daily.    . cetirizine (ZYRTEC) 10 MG tablet Take 10 mg by mouth daily.    Marland Kitchen glipiZIDE (GLUCOTROL) 5 MG tablet Take 5 mg by mouth daily.  0  . isosorbide mononitrate (IMDUR) 30 MG 24 hr tablet Take 1 tablet (30 mg total) by mouth daily. 90 tablet 1  . loperamide (IMODIUM) 2 MG capsule TAKE ONE CAPSULE BY MOUTH FOUR TIMES DAILY AS NEEDED FOR DIARRHEA OR LOOSE STOOLS 120 capsule 3  . metoprolol tartrate (LOPRESSOR) 50 MG tablet Take 1.5 tablets (75 mg  total) by mouth 2 (two) times daily. 90 tablet 6  . Omega-3 Fatty Acids (FISH OIL) 1000 MG CAPS Take 1 capsule by mouth 2 (two) times daily.     Marland Kitchen omeprazole (PRILOSEC) 40 MG capsule TAKE ONE CAPSULE BY MOUTH ONCE DAILY. 90 capsule 3  . pioglitazone (ACTOS) 30 MG tablet Take 30 mg by mouth daily.  3  . sertraline (ZOLOFT) 100 MG tablet Take 100 mg by mouth daily.   11  . sodium bicarbonate 650 MG tablet Take 650 mg by mouth 2 (two) times daily.  3  . ULORIC 80 MG TABS Take 1 tablet by mouth daily.  99  . Vitamin D, Ergocalciferol, (DRISDOL) 50000 units CAPS capsule TAKE ONE CAPSULE BY MOUTH ONCE A WEEK. STOP VITAMIN D 2,000 OTC.  6   No current facility-administered medications on file prior to visit.         Objective:   Physical Exam Blood pressure (!) 180/70, pulse 96, temperature 98.3 F (36.8 C), height _0  (1.88 m), weight 252 lb 4.8 oz (114.4 kg). Alert and oriented. Skin warm and dry. Oral mucosa is moist.   . Sclera anicteric, conjunctivae is pink. Thyroid not  enlarged. No cervical lymphadenopathy. Lungs clear. Heart regular rate and rhythm.  Abdomen is soft. Bowel sounds are positive. No hepatomegaly. No abdominal masses felt. No tenderness.  No edema to lower extremities.           Assessment & Plan:  Fatty liver with normal liver enzymes. Will get a CBC and Hepatic function. OV in 1 year.

## 2018-01-09 LAB — CBC WITH DIFFERENTIAL/PLATELET
BASOS PCT: 0.8 %
Basophils Absolute: 53 cells/uL (ref 0–200)
EOS ABS: 119 {cells}/uL (ref 15–500)
Eosinophils Relative: 1.8 %
HCT: 43.7 % (ref 38.5–50.0)
HEMOGLOBIN: 14.9 g/dL (ref 13.2–17.1)
Lymphs Abs: 1168 cells/uL (ref 850–3900)
MCH: 30.4 pg (ref 27.0–33.0)
MCHC: 34.1 g/dL (ref 32.0–36.0)
MCV: 89.2 fL (ref 80.0–100.0)
MPV: 11.2 fL (ref 7.5–12.5)
Monocytes Relative: 9.2 %
Neutro Abs: 4653 cells/uL (ref 1500–7800)
Neutrophils Relative %: 70.5 %
PLATELETS: 228 10*3/uL (ref 140–400)
RBC: 4.9 10*6/uL (ref 4.20–5.80)
RDW: 12.6 % (ref 11.0–15.0)
TOTAL LYMPHOCYTE: 17.7 %
WBC mixed population: 607 cells/uL (ref 200–950)
WBC: 6.6 10*3/uL (ref 3.8–10.8)

## 2018-01-09 LAB — HEPATIC FUNCTION PANEL
AG Ratio: 1.5 (calc) (ref 1.0–2.5)
ALBUMIN MSPROF: 4.1 g/dL (ref 3.6–5.1)
ALKALINE PHOSPHATASE (APISO): 77 U/L (ref 40–115)
ALT: 24 U/L (ref 9–46)
AST: 19 U/L (ref 10–35)
Bilirubin, Direct: 0.1 mg/dL (ref 0.0–0.2)
Globulin: 2.7 g/dL (calc) (ref 1.9–3.7)
Indirect Bilirubin: 0.7 mg/dL (calc) (ref 0.2–1.2)
Total Bilirubin: 0.8 mg/dL (ref 0.2–1.2)
Total Protein: 6.8 g/dL (ref 6.1–8.1)

## 2018-01-23 ENCOUNTER — Other Ambulatory Visit (INDEPENDENT_AMBULATORY_CARE_PROVIDER_SITE_OTHER): Payer: Self-pay | Admitting: Internal Medicine

## 2018-02-15 DIAGNOSIS — E559 Vitamin D deficiency, unspecified: Secondary | ICD-10-CM | POA: Diagnosis not present

## 2018-02-15 DIAGNOSIS — E1129 Type 2 diabetes mellitus with other diabetic kidney complication: Secondary | ICD-10-CM | POA: Diagnosis not present

## 2018-02-15 DIAGNOSIS — D649 Anemia, unspecified: Secondary | ICD-10-CM | POA: Diagnosis not present

## 2018-02-15 DIAGNOSIS — I129 Hypertensive chronic kidney disease with stage 1 through stage 4 chronic kidney disease, or unspecified chronic kidney disease: Secondary | ICD-10-CM | POA: Diagnosis not present

## 2018-02-15 DIAGNOSIS — E872 Acidosis: Secondary | ICD-10-CM | POA: Diagnosis not present

## 2018-02-15 DIAGNOSIS — N183 Chronic kidney disease, stage 3 (moderate): Secondary | ICD-10-CM | POA: Diagnosis not present

## 2018-02-15 DIAGNOSIS — R809 Proteinuria, unspecified: Secondary | ICD-10-CM | POA: Diagnosis not present

## 2018-02-20 ENCOUNTER — Other Ambulatory Visit (INDEPENDENT_AMBULATORY_CARE_PROVIDER_SITE_OTHER): Payer: Self-pay | Admitting: Internal Medicine

## 2018-03-08 DIAGNOSIS — Z7982 Long term (current) use of aspirin: Secondary | ICD-10-CM | POA: Diagnosis not present

## 2018-03-08 DIAGNOSIS — E1165 Type 2 diabetes mellitus with hyperglycemia: Secondary | ICD-10-CM | POA: Diagnosis not present

## 2018-03-08 DIAGNOSIS — Z8249 Family history of ischemic heart disease and other diseases of the circulatory system: Secondary | ICD-10-CM | POA: Diagnosis not present

## 2018-03-08 DIAGNOSIS — Z887 Allergy status to serum and vaccine status: Secondary | ICD-10-CM | POA: Diagnosis not present

## 2018-03-08 DIAGNOSIS — R739 Hyperglycemia, unspecified: Secondary | ICD-10-CM | POA: Diagnosis not present

## 2018-03-08 DIAGNOSIS — R05 Cough: Secondary | ICD-10-CM | POA: Diagnosis not present

## 2018-03-08 DIAGNOSIS — Z79899 Other long term (current) drug therapy: Secondary | ICD-10-CM | POA: Diagnosis not present

## 2018-03-08 DIAGNOSIS — K219 Gastro-esophageal reflux disease without esophagitis: Secondary | ICD-10-CM | POA: Diagnosis not present

## 2018-03-08 DIAGNOSIS — Z880 Allergy status to penicillin: Secondary | ICD-10-CM | POA: Diagnosis not present

## 2018-03-08 DIAGNOSIS — Z888 Allergy status to other drugs, medicaments and biological substances status: Secondary | ICD-10-CM | POA: Diagnosis not present

## 2018-03-08 DIAGNOSIS — R0602 Shortness of breath: Secondary | ICD-10-CM | POA: Diagnosis not present

## 2018-03-08 DIAGNOSIS — I10 Essential (primary) hypertension: Secondary | ICD-10-CM | POA: Diagnosis not present

## 2018-03-08 DIAGNOSIS — M1990 Unspecified osteoarthritis, unspecified site: Secondary | ICD-10-CM | POA: Diagnosis not present

## 2018-03-11 DIAGNOSIS — R1084 Generalized abdominal pain: Secondary | ICD-10-CM | POA: Diagnosis not present

## 2018-03-11 DIAGNOSIS — Z79899 Other long term (current) drug therapy: Secondary | ICD-10-CM | POA: Diagnosis not present

## 2018-03-11 DIAGNOSIS — I10 Essential (primary) hypertension: Secondary | ICD-10-CM | POA: Diagnosis not present

## 2018-03-11 DIAGNOSIS — K219 Gastro-esophageal reflux disease without esophagitis: Secondary | ICD-10-CM | POA: Diagnosis not present

## 2018-03-11 DIAGNOSIS — Z888 Allergy status to other drugs, medicaments and biological substances status: Secondary | ICD-10-CM | POA: Diagnosis not present

## 2018-03-11 DIAGNOSIS — M109 Gout, unspecified: Secondary | ICD-10-CM | POA: Diagnosis not present

## 2018-03-11 DIAGNOSIS — R450 Nervousness: Secondary | ICD-10-CM | POA: Diagnosis not present

## 2018-03-11 DIAGNOSIS — Z7982 Long term (current) use of aspirin: Secondary | ICD-10-CM | POA: Diagnosis not present

## 2018-03-11 DIAGNOSIS — Z880 Allergy status to penicillin: Secondary | ICD-10-CM | POA: Diagnosis not present

## 2018-03-11 DIAGNOSIS — M1990 Unspecified osteoarthritis, unspecified site: Secondary | ICD-10-CM | POA: Diagnosis not present

## 2018-03-11 DIAGNOSIS — Z9049 Acquired absence of other specified parts of digestive tract: Secondary | ICD-10-CM | POA: Diagnosis not present

## 2018-03-11 DIAGNOSIS — E1122 Type 2 diabetes mellitus with diabetic chronic kidney disease: Secondary | ICD-10-CM | POA: Diagnosis not present

## 2018-03-11 DIAGNOSIS — R1013 Epigastric pain: Secondary | ICD-10-CM | POA: Diagnosis not present

## 2018-03-11 DIAGNOSIS — E1165 Type 2 diabetes mellitus with hyperglycemia: Secondary | ICD-10-CM | POA: Diagnosis not present

## 2018-03-11 DIAGNOSIS — R748 Abnormal levels of other serum enzymes: Secondary | ICD-10-CM | POA: Diagnosis not present

## 2018-03-11 DIAGNOSIS — N189 Chronic kidney disease, unspecified: Secondary | ICD-10-CM | POA: Diagnosis not present

## 2018-03-13 ENCOUNTER — Other Ambulatory Visit: Payer: Self-pay

## 2018-03-13 NOTE — Patient Outreach (Signed)
[facility]) Care Management  03/13/2018  [name] 03-25-1950 [id number]   Referral Date: 03/13/2018 Referral Source: [facility] Referral Referral Reason: Recent ED visit and hard to understand, ? Medication compliance   Outreach Attempt: Spoke with patient. He states he does not want anyone calling him and refused to listen to CM.  He says it was told he was not taking his medications and he states he is.  He states do not call him any more and ended the call.     Plan: RN CM will send letter and close case due to refusal.    Jone Baseman, RN, MSN [place] Management Care Management Coordinator Direct Line [phone] Toll Free: [phone]  Fax: [phone]

## 2018-03-16 DIAGNOSIS — I10 Essential (primary) hypertension: Secondary | ICD-10-CM | POA: Diagnosis not present

## 2018-04-02 DIAGNOSIS — I129 Hypertensive chronic kidney disease with stage 1 through stage 4 chronic kidney disease, or unspecified chronic kidney disease: Secondary | ICD-10-CM | POA: Diagnosis not present

## 2018-04-02 DIAGNOSIS — E1122 Type 2 diabetes mellitus with diabetic chronic kidney disease: Secondary | ICD-10-CM | POA: Diagnosis not present

## 2018-04-02 DIAGNOSIS — K219 Gastro-esophageal reflux disease without esophagitis: Secondary | ICD-10-CM | POA: Diagnosis not present

## 2018-04-02 DIAGNOSIS — K859 Acute pancreatitis without necrosis or infection, unspecified: Secondary | ICD-10-CM | POA: Diagnosis not present

## 2018-04-02 DIAGNOSIS — N189 Chronic kidney disease, unspecified: Secondary | ICD-10-CM | POA: Diagnosis not present

## 2018-04-02 DIAGNOSIS — M109 Gout, unspecified: Secondary | ICD-10-CM | POA: Diagnosis not present

## 2018-04-02 DIAGNOSIS — Z7982 Long term (current) use of aspirin: Secondary | ICD-10-CM | POA: Diagnosis not present

## 2018-04-02 DIAGNOSIS — R000 Tachycardia, unspecified: Secondary | ICD-10-CM | POA: Diagnosis not present

## 2018-04-02 DIAGNOSIS — I10 Essential (primary) hypertension: Secondary | ICD-10-CM | POA: Diagnosis not present

## 2018-04-02 DIAGNOSIS — Z880 Allergy status to penicillin: Secondary | ICD-10-CM | POA: Diagnosis not present

## 2018-04-02 DIAGNOSIS — E1165 Type 2 diabetes mellitus with hyperglycemia: Secondary | ICD-10-CM | POA: Diagnosis not present

## 2018-04-02 DIAGNOSIS — R0789 Other chest pain: Secondary | ICD-10-CM | POA: Diagnosis not present

## 2018-04-02 DIAGNOSIS — R479 Unspecified speech disturbances: Secondary | ICD-10-CM | POA: Diagnosis not present

## 2018-04-02 DIAGNOSIS — E7800 Pure hypercholesterolemia, unspecified: Secondary | ICD-10-CM | POA: Diagnosis not present

## 2018-04-02 DIAGNOSIS — R079 Chest pain, unspecified: Secondary | ICD-10-CM | POA: Diagnosis not present

## 2018-04-03 DIAGNOSIS — M109 Gout, unspecified: Secondary | ICD-10-CM | POA: Diagnosis not present

## 2018-04-03 DIAGNOSIS — K219 Gastro-esophageal reflux disease without esophagitis: Secondary | ICD-10-CM | POA: Diagnosis not present

## 2018-04-03 DIAGNOSIS — K859 Acute pancreatitis without necrosis or infection, unspecified: Secondary | ICD-10-CM | POA: Diagnosis not present

## 2018-04-03 DIAGNOSIS — K760 Fatty (change of) liver, not elsewhere classified: Secondary | ICD-10-CM | POA: Diagnosis not present

## 2018-04-03 DIAGNOSIS — E1165 Type 2 diabetes mellitus with hyperglycemia: Secondary | ICD-10-CM | POA: Diagnosis not present

## 2018-04-03 DIAGNOSIS — R0789 Other chest pain: Secondary | ICD-10-CM | POA: Diagnosis not present

## 2018-04-03 DIAGNOSIS — E7800 Pure hypercholesterolemia, unspecified: Secondary | ICD-10-CM | POA: Diagnosis not present

## 2018-04-03 DIAGNOSIS — R479 Unspecified speech disturbances: Secondary | ICD-10-CM | POA: Diagnosis not present

## 2018-04-03 DIAGNOSIS — I129 Hypertensive chronic kidney disease with stage 1 through stage 4 chronic kidney disease, or unspecified chronic kidney disease: Secondary | ICD-10-CM | POA: Diagnosis not present

## 2018-04-03 DIAGNOSIS — Z7982 Long term (current) use of aspirin: Secondary | ICD-10-CM | POA: Diagnosis not present

## 2018-04-03 DIAGNOSIS — N281 Cyst of kidney, acquired: Secondary | ICD-10-CM | POA: Diagnosis not present

## 2018-04-03 DIAGNOSIS — E1122 Type 2 diabetes mellitus with diabetic chronic kidney disease: Secondary | ICD-10-CM | POA: Diagnosis not present

## 2018-04-03 DIAGNOSIS — N189 Chronic kidney disease, unspecified: Secondary | ICD-10-CM | POA: Diagnosis not present

## 2018-04-03 DIAGNOSIS — Z880 Allergy status to penicillin: Secondary | ICD-10-CM | POA: Diagnosis not present

## 2018-04-03 DIAGNOSIS — R079 Chest pain, unspecified: Secondary | ICD-10-CM | POA: Diagnosis not present

## 2018-04-11 DIAGNOSIS — R079 Chest pain, unspecified: Secondary | ICD-10-CM | POA: Diagnosis not present

## 2018-04-11 DIAGNOSIS — K859 Acute pancreatitis without necrosis or infection, unspecified: Secondary | ICD-10-CM | POA: Diagnosis not present

## 2018-05-04 DIAGNOSIS — I10 Essential (primary) hypertension: Secondary | ICD-10-CM | POA: Diagnosis not present

## 2018-05-04 DIAGNOSIS — Z139 Encounter for screening, unspecified: Secondary | ICD-10-CM | POA: Diagnosis not present

## 2018-05-04 DIAGNOSIS — Z79899 Other long term (current) drug therapy: Secondary | ICD-10-CM | POA: Diagnosis not present

## 2018-05-04 DIAGNOSIS — Z7982 Long term (current) use of aspirin: Secondary | ICD-10-CM | POA: Diagnosis not present

## 2018-05-10 DIAGNOSIS — E1165 Type 2 diabetes mellitus with hyperglycemia: Secondary | ICD-10-CM | POA: Diagnosis not present

## 2018-05-10 DIAGNOSIS — R000 Tachycardia, unspecified: Secondary | ICD-10-CM | POA: Diagnosis not present

## 2018-05-10 DIAGNOSIS — R079 Chest pain, unspecified: Secondary | ICD-10-CM | POA: Diagnosis not present

## 2018-05-10 DIAGNOSIS — I10 Essential (primary) hypertension: Secondary | ICD-10-CM | POA: Diagnosis not present

## 2018-05-17 ENCOUNTER — Other Ambulatory Visit: Payer: Self-pay | Admitting: Cardiology

## 2018-06-02 DIAGNOSIS — E1165 Type 2 diabetes mellitus with hyperglycemia: Secondary | ICD-10-CM | POA: Diagnosis not present

## 2018-06-02 DIAGNOSIS — I10 Essential (primary) hypertension: Secondary | ICD-10-CM | POA: Diagnosis not present

## 2018-06-02 DIAGNOSIS — R58 Hemorrhage, not elsewhere classified: Secondary | ICD-10-CM | POA: Diagnosis not present

## 2018-06-16 ENCOUNTER — Other Ambulatory Visit (INDEPENDENT_AMBULATORY_CARE_PROVIDER_SITE_OTHER): Payer: Self-pay | Admitting: Internal Medicine

## 2018-07-02 DIAGNOSIS — E1165 Type 2 diabetes mellitus with hyperglycemia: Secondary | ICD-10-CM | POA: Diagnosis not present

## 2018-07-02 DIAGNOSIS — Z1389 Encounter for screening for other disorder: Secondary | ICD-10-CM | POA: Diagnosis not present

## 2018-07-02 DIAGNOSIS — Z0001 Encounter for general adult medical examination with abnormal findings: Secondary | ICD-10-CM | POA: Diagnosis not present

## 2018-10-06 DIAGNOSIS — R51 Headache: Secondary | ICD-10-CM | POA: Diagnosis not present

## 2018-10-11 ENCOUNTER — Other Ambulatory Visit (INDEPENDENT_AMBULATORY_CARE_PROVIDER_SITE_OTHER): Payer: Self-pay | Admitting: Internal Medicine

## 2018-10-17 DIAGNOSIS — E1165 Type 2 diabetes mellitus with hyperglycemia: Secondary | ICD-10-CM | POA: Diagnosis not present

## 2018-10-17 DIAGNOSIS — E1140 Type 2 diabetes mellitus with diabetic neuropathy, unspecified: Secondary | ICD-10-CM | POA: Diagnosis not present

## 2018-10-17 DIAGNOSIS — Z1389 Encounter for screening for other disorder: Secondary | ICD-10-CM | POA: Diagnosis not present

## 2018-10-17 DIAGNOSIS — Z23 Encounter for immunization: Secondary | ICD-10-CM | POA: Diagnosis not present

## 2018-11-14 DIAGNOSIS — I10 Essential (primary) hypertension: Secondary | ICD-10-CM | POA: Diagnosis not present

## 2018-11-14 DIAGNOSIS — E782 Mixed hyperlipidemia: Secondary | ICD-10-CM | POA: Diagnosis not present

## 2018-11-14 DIAGNOSIS — N189 Chronic kidney disease, unspecified: Secondary | ICD-10-CM | POA: Diagnosis not present

## 2018-11-20 ENCOUNTER — Other Ambulatory Visit: Payer: Self-pay | Admitting: *Deleted

## 2018-11-20 MED ORDER — METOPROLOL TARTRATE 50 MG PO TABS: 75.0000 mg | ORAL_TABLET | Freq: Two times a day (BID) | ORAL | 1 refills | Status: DC

## 2018-11-21 DIAGNOSIS — N189 Chronic kidney disease, unspecified: Secondary | ICD-10-CM | POA: Diagnosis not present

## 2018-11-21 DIAGNOSIS — L0390 Cellulitis, unspecified: Secondary | ICD-10-CM | POA: Diagnosis not present

## 2018-11-21 DIAGNOSIS — I10 Essential (primary) hypertension: Secondary | ICD-10-CM | POA: Diagnosis not present

## 2018-11-21 DIAGNOSIS — E782 Mixed hyperlipidemia: Secondary | ICD-10-CM | POA: Diagnosis not present

## 2018-11-23 ENCOUNTER — Other Ambulatory Visit (HOSPITAL_COMMUNITY): Payer: Self-pay | Admitting: Physician Assistant

## 2018-11-23 ENCOUNTER — Other Ambulatory Visit: Payer: Self-pay | Admitting: Physician Assistant

## 2018-11-23 DIAGNOSIS — L0390 Cellulitis, unspecified: Secondary | ICD-10-CM

## 2018-12-05 ENCOUNTER — Other Ambulatory Visit: Payer: Self-pay

## 2018-12-05 ENCOUNTER — Ambulatory Visit (HOSPITAL_COMMUNITY)
Admission: RE | Admit: 2018-12-05 | Discharge: 2018-12-05 | Disposition: A | Discharge status: Home / Self Care | Payer: Medicare Other | Source: Ambulatory Visit | Attending: Physician Assistant | Admitting: Physician Assistant

## 2018-12-05 DIAGNOSIS — L089 Local infection of the skin and subcutaneous tissue, unspecified: Secondary | ICD-10-CM | POA: Diagnosis not present

## 2018-12-05 DIAGNOSIS — L97519 Non-pressure chronic ulcer of other part of right foot with unspecified severity: Secondary | ICD-10-CM | POA: Diagnosis not present

## 2018-12-05 DIAGNOSIS — L0390 Cellulitis, unspecified: Secondary | ICD-10-CM | POA: Diagnosis not present

## 2018-12-14 DIAGNOSIS — E1122 Type 2 diabetes mellitus with diabetic chronic kidney disease: Secondary | ICD-10-CM | POA: Diagnosis not present

## 2018-12-14 DIAGNOSIS — Z794 Long term (current) use of insulin: Secondary | ICD-10-CM | POA: Diagnosis not present

## 2018-12-14 DIAGNOSIS — E1140 Type 2 diabetes mellitus with diabetic neuropathy, unspecified: Secondary | ICD-10-CM | POA: Diagnosis not present

## 2018-12-14 DIAGNOSIS — I129 Hypertensive chronic kidney disease with stage 1 through stage 4 chronic kidney disease, or unspecified chronic kidney disease: Secondary | ICD-10-CM | POA: Diagnosis not present

## 2018-12-19 ENCOUNTER — Telehealth: Payer: Self-pay | Admitting: Family

## 2018-12-19 ENCOUNTER — Encounter: Payer: Self-pay | Admitting: Family

## 2018-12-19 ENCOUNTER — Ambulatory Visit (INDEPENDENT_AMBULATORY_CARE_PROVIDER_SITE_OTHER): Payer: Medicare Other | Admitting: Family

## 2018-12-19 ENCOUNTER — Other Ambulatory Visit: Payer: Self-pay

## 2018-12-19 VITALS — Ht 74.0 in | Wt 250.0 lb

## 2018-12-19 DIAGNOSIS — M869 Osteomyelitis, unspecified: Secondary | ICD-10-CM | POA: Diagnosis not present

## 2018-12-19 NOTE — Telephone Encounter (Signed)
Erin please advise, thank you.  

## 2018-12-19 NOTE — Telephone Encounter (Signed)
Patient's wife left a voicemail message wanting to know if the antibiotic will be called into the pharmacy.  Patient uses [facility] in [place].  CB#[phone].  Thank you

## 2018-12-19 NOTE — Progress Notes (Signed)
Office Visit Note   Patient: ROHEN KIMES           Date of Birth: 06-14-1950           MRN: [id number] Visit Date: 12/19/2018              Requested by: Redmond School, [address] [place],  [place] [address] PCP: Redmond School, MD  Chief Complaint  Patient presents with  . Right Foot - Pain      HPI: The patient is a 68 year old gentleman who presents today for initial evaluation of her right great toe ulcer. this is been ongoing for about a month month according to his family.  Initially presented with swelling of the great toe on the right was later found to have a plantar ulcer.  He did complete a 10-day course of Bactrim.  Family reports good resolution of the cellulitis with the Bactrim  They have been doing Silvadene dressing changes as well as iodine and Epson salt soaks.  Attempting to keep a dressing over the wound however the patient does prefer the left foot.  Has had an MRI on October 21 this was revealing for osteomyelitis of the distal phalanx   Family reports his recent diagnosis of diabetes.  This is not well controlled yet.  Assessment & Plan: Visit Diagnoses: No diagnosis found.  Plan: Discussed surgical options.  Patient would like to proceed with conservative measures we will place him on a doxycycline course he will follow-up in the office in 2 to 3 weeks discussed return precautions they will continue with Silvadene dressing changes.  Follow-Up Instructions: No follow-ups on file.   Ortho Exam  Patient is alert, oriented, no adenopathy, well-dressed, normal affect, normal respiratory effort. On examination of the right foot there is no edema or erythema to the foot however he does have sausage digit swelling of the great toe there is onychomycosis of the toenail.  There is a plantar ulcer this is 3 mm in diameter epiboly to the edges this does not probe to bone or tendon today there is flat pink tissue in the wound bed scant clear  drainage.  He does have a strong dorsalis pedis pulse.  Imaging: No results found. No images are attached to the encounter.  Labs: Lab Results  Component Value Date   REPTSTATUS 08/30/2011 FINAL 08/28/2011   CULT  08/28/2011    Multiple bacterial morphotypes present, none predominant. Suggest appropriate recollection if clinically indicated.     Lab Results  Component Value Date   ALBUMIN 4.4 05/14/2013   ALBUMIN 3.8 08/28/2011   ALBUMIN 3.7 07/20/2010    No results found for: MG No results found for: VD25OH  No results found for: PREALBUMIN CBC EXTENDED Latest Ref Rng & Units 01/08/2018 12/26/2016 06/16/2015  WBC 3.8 - 10.8 Thousand/uL 6.6 5.1 9.6  RBC 4.20 - 5.80 Million/uL 4.90 4.67 4.65  HGB 13.2 - 17.1 g/dL 14.9 14.2 13.9  HCT 38.5 - 50.0 % 43.7 40.8 40.8  PLT 140 - 400 Thousand/uL 228 197 200  NEUTROABS 1,500 - 7,800 cells/uL 4,653 3,121 6,240  LYMPHSABS 850 - 3,900 cells/uL 1,168 1,301 2,400     Body mass index is 32.1 kg/m.  Orders:  No orders of the defined types were placed in this encounter.  No orders of the defined types were placed in this encounter.    Procedures: No procedures performed  Clinical Data: No additional findings.  ROS:  All other systems negative, except as  noted in the HPI. Review of Systems  Constitutional: Negative for chills and fever.  Cardiovascular: Negative for leg swelling.  Skin: Positive for color change and wound.    Objective: Vital Signs: Ht 6' 2"  (1.88 m)   Wt 250 lb (113.4 kg)   BMI 32.10 kg/m   Specialty Comments:  No specialty comments available.  PMFS History: Patient Active Problem List   Diagnosis Date Noted  . Thrombocytopenia, unspecified ([place]) 05/14/2013  . Colon adenoma 09/13/2012  . Family hx of colon cancer 09/13/2012  . Anxiety 09/06/2012  . Mental retardation 09/06/2012  . Chest pain 09/06/2012  . GERD (gastroesophageal reflux disease) 03/08/2012  . Nausea 09/06/2011  . Gout  09/06/2011  . Diarrhea 09/06/2011  . Hypertension 03/08/2011   Past Medical History:  Diagnosis Date  . Colon polyps   . Gout   . High cholesterol   . History of echocardiogram 09/14/2011   EF >55%; borderline concentric LVH;   . History of stress test 06/21/2010   exercise; normal study  . Hypertension   . MET TEST 08/15/2011   moderate peak VO2 limitation at 66% predicted; mod cardiac impairment with low SV & low anaerobic threshold, mild chronotropic incompetence; low risk  . Nausea   . Renal disorder     Family History  Problem Relation Age of Onset  . Colon cancer Brother     Past Surgical History:  Procedure Laterality Date  . CHOLECYSTECTOMY    . COLONOSCOPY N/A 09/21/2012   Procedure: COLONOSCOPY;  Surgeon: Rogene Houston, MD;  [facility];  Service: Endoscopy;  Laterality: N/A;  730  . HERNIA REPAIR     x 2  . HIP SURGERY     Rt hip age 67  . NASAL POLYP SURGERY      2-3 months ago   Social History   Occupational History  . Not on file  Tobacco Use  . Smoking status: Never Smoker  . Smokeless tobacco: Never Used  Substance and Sexual Activity  . Alcohol use: No  . Drug use: No  . Sexual activity: Not on file

## 2018-12-21 MED ORDER — DOXYCYCLINE HYCLATE 100 MG PO TABS: 100.0000 mg | ORAL_TABLET | Freq: Two times a day (BID) | ORAL | 0 refills | Status: DC

## 2019-01-03 ENCOUNTER — Other Ambulatory Visit: Payer: Self-pay | Admitting: Cardiology

## 2019-01-09 ENCOUNTER — Encounter (INDEPENDENT_AMBULATORY_CARE_PROVIDER_SITE_OTHER): Payer: Self-pay | Admitting: *Deleted

## 2019-01-09 ENCOUNTER — Telehealth (INDEPENDENT_AMBULATORY_CARE_PROVIDER_SITE_OTHER): Payer: Self-pay | Admitting: *Deleted

## 2019-01-09 ENCOUNTER — Ambulatory Visit (INDEPENDENT_AMBULATORY_CARE_PROVIDER_SITE_OTHER): Payer: Medicare Other | Admitting: Family

## 2019-01-09 ENCOUNTER — Encounter: Payer: Self-pay | Admitting: Family

## 2019-01-09 ENCOUNTER — Ambulatory Visit (INDEPENDENT_AMBULATORY_CARE_PROVIDER_SITE_OTHER): Payer: Medicare Other | Admitting: Nurse Practitioner

## 2019-01-09 ENCOUNTER — Other Ambulatory Visit: Payer: Self-pay

## 2019-01-09 ENCOUNTER — Encounter (INDEPENDENT_AMBULATORY_CARE_PROVIDER_SITE_OTHER): Payer: Self-pay | Admitting: Nurse Practitioner

## 2019-01-09 VITALS — BP 170/79 | HR 80 | Temp 97.3°F | Ht 74.0 in | Wt 237.9 lb

## 2019-01-09 VITALS — Ht 74.0 in | Wt 237.0 lb

## 2019-01-09 DIAGNOSIS — D696 Thrombocytopenia, unspecified: Secondary | ICD-10-CM | POA: Diagnosis not present

## 2019-01-09 DIAGNOSIS — D126 Benign neoplasm of colon, unspecified: Secondary | ICD-10-CM | POA: Diagnosis not present

## 2019-01-09 DIAGNOSIS — K760 Fatty (change of) liver, not elsewhere classified: Secondary | ICD-10-CM | POA: Insufficient documentation

## 2019-01-09 DIAGNOSIS — M869 Osteomyelitis, unspecified: Secondary | ICD-10-CM

## 2019-01-09 DIAGNOSIS — E119 Type 2 diabetes mellitus without complications: Secondary | ICD-10-CM | POA: Diagnosis not present

## 2019-01-09 MED ORDER — SUPREP BOWEL PREP KIT 17.5-3.13-1.6 GM/177ML PO SOLN: 1.0000 | Freq: Once | ORAL | 0 refills | Status: AC

## 2019-01-09 NOTE — Progress Notes (Signed)
Office Visit Note   Patient: [name]           Date of Birth: 29-Jul-1950           MRN: [id number] Visit Date: 01/09/2019              Requested by: Redmond School, [place],  [place] [address] PCP: Redmond School, MD  Chief Complaint  Patient presents with  . Right Foot - Follow-up    GT ulcer follow up       HPI: The patient is a 68 year old gentleman who presents today in follow-up for ulceration of his right great toe ulcer.  Has had an MRI this was on October 21 and was revealing for osteomyelitis of the distal phalanx.   This is been ongoing for several months now he initially completed a 10-day course of Bactrim, he has been on doxycycline since last visit.  Unfortunately the patient has MR.  He is unable to always be compliant with treatments.  His caregiver had recently been unable to visit the patient and attend to his wound as she was quarantining for 14 days.  The patient tends to remove his shoe wear and dressings he has been barefoot outside many times.  Had been trying to do Silvadene dressing changes   Does have a history of gout its been many years he takes Uloric daily.  In the past has had gout in his elbows, has never had in his feet.  Family reports his recent diagnosis of diabetes.  This is not well controlled yet.  Is due to have a visit to his primary care provider next Wednesday.  They were planning to do routine lab draws at that time  Assessment & Plan: Visit Diagnoses:  1. Osteomyelitis of great toe of right foot ([place])     Plan: Doubt gout but will draw a uric acid level to evaluate. Will send hgba1c as well. Likely osteomyelitis of right great toe, discussed surgical options.  Patient would like to proceed with conservative measures we will continue him on a doxycycline course he will follow-up in the office in  3 weeks with Dr. Sharol Given.  Follow-Up Instructions: Return in about 22 days (around 01/31/2019).   Ortho Exam   Patient is alert, oriented, no adenopathy, well-dressed, normal affect, normal respiratory effort. On examination of the right foot there is no edema or erythema to the foot. however he does have sausage digit swelling of the great toe with mild erythema, no warmth. there is onychomycosis of the toenail.  There is a plantar ulcer this is 3 mm in diameter, overlying callus debrided. There is gravel in the wound bed, this was removed. this does not probe to bone or tendon today there is flat pink tissue in the wound bed. No drainage. He does have a strong dorsalis pedis pulse.  Imaging: No results found. No images are attached to the encounter.  Labs: Lab Results  Component Value Date   REPTSTATUS 08/30/2011 FINAL 08/28/2011   CULT  08/28/2011    Multiple bacterial morphotypes present, none predominant. Suggest appropriate recollection if clinically indicated.     Lab Results  Component Value Date   ALBUMIN 4.4 05/14/2013   ALBUMIN 3.8 08/28/2011   ALBUMIN 3.7 07/20/2010    No results found for: MG No results found for: VD25OH  No results found for: PREALBUMIN CBC EXTENDED Latest Ref Rng & Units 01/08/2018 12/26/2016 06/16/2015  WBC 3.8 - 10.8 Thousand/uL 6.6 5.1  9.6  RBC 4.20 - 5.80 Million/uL 4.90 4.67 4.65  HGB 13.2 - 17.1 g/dL 14.9 14.2 13.9  HCT 38.5 - 50.0 % 43.7 40.8 40.8  PLT 140 - 400 Thousand/uL 228 197 200  NEUTROABS 1,500 - 7,800 cells/uL 4,653 3,121 6,240  LYMPHSABS 850 - 3,900 cells/uL 1,168 1,301 2,400     Body mass index is 30.43 kg/m.  Orders:  Orders Placed This Encounter  Procedures  . Uric acid  . HgB A1c   No orders of the defined types were placed in this encounter.    Procedures: No procedures performed  Clinical Data: No additional findings.  ROS:  All other systems negative, except as noted in the HPI. Review of Systems  Constitutional: Negative for chills and fever.  Cardiovascular: Negative for leg swelling.  Skin: Positive for  color change and wound.    Objective: Vital Signs: Ht _0  (1.88 m)   Wt 237 lb (107.5 kg)   BMI 30.43 kg/m   Specialty Comments:  No specialty comments available.  PMFS History: Patient Active Problem List   Diagnosis Date Noted  . Fatty liver 01/09/2019  . Thrombocytopenia, unspecified ([place]) 05/14/2013  . Colon adenoma 09/13/2012  . Family hx of colon cancer 09/13/2012  . Anxiety 09/06/2012  . Mental retardation 09/06/2012  . Chest pain 09/06/2012  . GERD (gastroesophageal reflux disease) 03/08/2012  . Nausea 09/06/2011  . Gout 09/06/2011  . Diarrhea 09/06/2011  . Hypertension 03/08/2011   Past Medical History:  Diagnosis Date  . Colon polyps   . Gout   . High cholesterol   . History of echocardiogram 09/14/2011   EF >55%; borderline concentric LVH;   . History of stress test 06/21/2010   exercise; normal study  . Hypertension   . MET TEST 08/15/2011   moderate peak VO2 limitation at 66% predicted; mod cardiac impairment with low SV & low anaerobic threshold, mild chronotropic incompetence; low risk  . Nausea   . Renal disorder     Family History  Problem Relation Age of Onset  . Colon cancer Brother     Past Surgical History:  Procedure Laterality Date  . CHOLECYSTECTOMY    . COLONOSCOPY N/A 09/21/2012   Procedure: COLONOSCOPY;  Surgeon: Rogene Houston, MD;  [facility];  Service: Endoscopy;  Laterality: N/A;  730  . HERNIA REPAIR     x 2  . HIP SURGERY     Rt hip age 61  . NASAL POLYP SURGERY      2-3 months ago   Social History   Occupational History  . Not on file  Tobacco Use  . Smoking status: Never Smoker  . Smokeless tobacco: Never Used  Substance and Sexual Activity  . Alcohol use: No  . Drug use: No  . Sexual activity: Not on file

## 2019-01-09 NOTE — Patient Instructions (Addendum)
1.  Schedule a colonoscopy  2.  Complete the provided lab order  3.  Continue omeprazole 40 mg once daily  4.  Further follow-up to be determined after colonoscopy completed  5.  Reduce sugar intake, do not gain weight.  Walk 30 to 45 minutes daily as tolerated.

## 2019-01-09 NOTE — Progress Notes (Signed)
Subjective:    Patient ID: [name], male    DOB: December 16, 1950, 68 y.o.   MRN: [id number]  HPI patient is a 68 year old male with a past medical history of hypertension, hypercholesterolemia, mild mental impairment, gout, CKD, fatty liver, GERD and colon polyps.  Past cholecystectomy.  Mother with colon polyps and brother with history of colon cancer with diagnosed in his mid 3's with recurrence.  He presents today accompanied by his cousin Stanton Kidney who is his primary care giver. He presents today for GERD and fatty liver follow up. He denies having any dysphagia. Infrequent heartburn. No dysphagia. He takes Pantoprazole 49m once daily. He is on ASA 852monce daily. EGD done in 06/30/2010 by Dr. ThCarlton Adamas normal. Duodenal biopsies were negative for celiac disease. He intermittently  sees bright red blood on the toilet tissue and in the toilet water which last occurred 2 or 3 weeks ago. No associated abdominal or rectal pain. His most recent colonoscopy was 09/21/2012 which identified diverticulosis and 2 TA polyps. No alcohol use. He is currently on Doxycycline due to cellulitis and osteomyelitis to his right great toe.  Labs 01/08/2018: AST 19. ALT 24. T. Bili 0.8. Alk Phos 77. WBC 6.6. Hg 14.9. HCT 43.7. PLT 228.  Colonoscopy 09/21/2012 Dr. ReDorien ChihuahuaExamination performed to cecum. Single cecal diverticulum. Two small tubular adenomatous polyps ablated via cold biopsy and submitted together(splenic flexure and descending colon). Internal/external hemorrhoids. -Repeat colonoscopy in 5 years  Abdominal sonogram 01/04/2017: Changes consistent with fatty infiltration of the liver. No definitive mass is seen. Status post cholecystectomy.  05/2017 [facility] stress: no ischemia Past Medical History:  Diagnosis Date  . Colon polyps   . Gout   . High cholesterol   . History of echocardiogram 09/14/2011   EF >55%; borderline concentric LVH;   . History of stress test 06/21/2010   exercise;  normal study  . Hypertension   . MET TEST 08/15/2011   moderate peak VO2 limitation at 66% predicted; mod cardiac impairment with low SV & low anaerobic threshold, mild chronotropic incompetence; low risk  . Nausea   . Renal disorder    Past Surgical History:  Procedure Laterality Date  . CHOLECYSTECTOMY    . COLONOSCOPY N/A 09/21/2012   Procedure: COLONOSCOPY;  Surgeon: NaRogene HoustonMD;  [facility];  Service: Endoscopy;  Laterality: N/A;  730  . HERNIA REPAIR     x 2  . HIP SURGERY     Rt hip age 89. NASAL POLYP SURGERY      2-3 months ago   Current Outpatient Medications on File Prior to Visit  Medication Sig Dispense Refill  . acetaminophen (TYLENOL) 500 MG tablet Take 1,000 mg by mouth every 6 (six) hours as needed. Pain    . amLODipine (NORVASC) 10 MG tablet Take 10 mg by mouth daily.  3  . aspirin 81 MG tablet Take 81 mg by mouth daily.     . Marland Kitchentorvastatin (LIPITOR) 10 MG tablet Take 10 mg by mouth at bedtime.  3  . buPROPion (WELLBUTRIN XL) 150 MG 24 hr tablet Take 1 tablet by mouth daily.    . cetirizine (ZYRTEC) 10 MG tablet Take 10 mg by mouth daily.    . Marland Kitchenoxycycline (VIBRA-TABS) 100 MG tablet Take 1 tablet (100 mg total) by mouth 2 (two) times daily. 60 tablet 0  . glipiZIDE (GLUCOTROL) 5 MG tablet Take 5 mg by mouth daily.  0  . isosorbide  mononitrate (IMDUR) 30 MG 24 hr tablet Take 1 tablet (30 mg total) by mouth daily. 90 tablet 1  . loperamide (IMODIUM) 2 MG capsule TAKE ONE CAPSULE BY MOUTH FOUR TIMES DAILY AS NEEDED FOR DIARRHEA OR LOOSE STOOLS 120 capsule 3  . metoprolol tartrate (LOPRESSOR) 50 MG tablet Take 1.5 tablets (75 mg total) by mouth 2 (two) times daily. 90 tablet 1  . Omega-3 Fatty Acids (FISH OIL) 1000 MG CAPS Take 1 capsule by mouth 2 (two) times daily.     Marland Kitchen omeprazole (PRILOSEC) 40 MG capsule TAKE ONE CAPSULE BY MOUTH ONCE DAILY. 90 capsule 3  . pioglitazone (ACTOS) 30 MG tablet Take 30 mg by mouth daily.  3  . sertraline (ZOLOFT) 100  MG tablet Take 100 mg by mouth daily.   11  . sodium bicarbonate 650 MG tablet Take 650 mg by mouth 2 (two) times daily.  3  . ULORIC 80 MG TABS Take 1 tablet by mouth daily.  99  . Vitamin D, Ergocalciferol, (DRISDOL) 50000 units CAPS capsule TAKE ONE CAPSULE BY MOUTH ONCE A WEEK. STOP VITAMIN D 2,000 OTC.  6   No current facility-administered medications on file prior to visit.    Allergies  Allergen Reactions  . Penicillins Other (See Comments)    Unknown  . Ivp Dye [Iodinated Diagnostic Agents]     Sped heart reate up   . Tetanus Toxoids Rash   Review of Systems HPI, all other systems reviewed and are negative    Objective:   Physical Exam  BP (!) 170/79 (BP Location: Right Arm, Patient Position: Sitting, Cuff Size: Large)   Pulse 80   Temp (!) 97.3 F (36.3 C) (Oral)   Ht 6' 2"  (1.88 m)   Wt 237 lb 14.4 oz (107.9 kg)   BMI 30.54 kg/m  General: 68 year old male alert in no acute distress Eyes: Sclera nonicteric, conjunctiva pink Mouth: Absent dentition, no ulcers or lesions Neck: Supple, no lymphadenopathy or thyromegaly appreciated Heart: Regular rate and rhythm, no murmurs Lungs: Breath sounds clear throughout Abdomen: Soft, nontender, no masses or organomegaly, positive bowel sounds to all 4 quadrants, 3 to 4 cm horizontal scar above the umbilicus intact Extremities: No edema Neuro: Alert and oriented x3, no focal deficits, patient answers questions appropriately     Assessment & Plan:   52.  68 year old male with GERD which is well controlled on omeprazole 40 mg once daily -Dr. Dorien Chihuahua to verify if the patient should have an EGD at the time of his colonoscopy to survey for Barrett's esophagus as it is unclear when the patient last had an EGD in the past.  2.  History of a tubulovillous adenoma 2004, subsequent colonoscopies showed hyperplastic adenomatous colon polyps.  Family history of colon polyps and colon cancer.  -Colonoscopy benefits and risk discussed  including risk with sedation, risk of bleeding, perforation and infection -Patient to hold Aspirin, Actos and Glipizide a.m. of colonoscopy.  Take half dose of [place] the evening before his colonoscopy.  3.  Fatty liver -CMP and CBC, patient wishes to complete these labs at the time of his appointment with his PCP next week. -Encouraged eating a healthy diet, avoid weight gain -Consider abdominal sonogram with elastography to assess for cirrhosis, further recommendations per Dr. Laural Golden  4.  Thrombocytopenia since at least 2012 and thought not to have cirrhosis without evidence of splenomegaly or portal HTN -See plan # 3  5.  Infrequent rectal bleeding   6. HTN  7.  DM II  Further follow up to be determined after the above evaluation completed

## 2019-01-09 NOTE — Telephone Encounter (Signed)
Patient needs suprep TCS 12/16

## 2019-01-10 LAB — CBC WITH DIFFERENTIAL/PLATELET
Absolute Monocytes: 588 cells/uL (ref 200–950)
Basophils Absolute: 48 cells/uL (ref 0–200)
Basophils Relative: 0.8 %
Eosinophils Absolute: 102 cells/uL (ref 15–500)
Eosinophils Relative: 1.7 %
HCT: 41.6 % (ref 38.5–50.0)
Hemoglobin: 14 g/dL (ref 13.2–17.1)
Lymphs Abs: 1410 cells/uL (ref 850–3900)
MCH: 29.4 pg (ref 27.0–33.0)
MCHC: 33.7 g/dL (ref 32.0–36.0)
MCV: 87.2 fL (ref 80.0–100.0)
MPV: 11.5 fL (ref 7.5–12.5)
Monocytes Relative: 9.8 %
Neutro Abs: 3852 cells/uL (ref 1500–7800)
Neutrophils Relative %: 64.2 %
Platelets: 217 10*3/uL (ref 140–400)
RBC: 4.77 10*6/uL (ref 4.20–5.80)
RDW: 13.3 % (ref 11.0–15.0)
Total Lymphocyte: 23.5 %
WBC: 6 10*3/uL (ref 3.8–10.8)

## 2019-01-10 LAB — HEMOGLOBIN A1C
Hgb A1c MFr Bld: 8.6 % of total Hgb — ABNORMAL HIGH (ref <5.7)
Mean Plasma Glucose: 200 (calc)
eAG (mmol/L): 11.1 (calc)

## 2019-01-10 LAB — COMPLETE METABOLIC PANEL WITH GFR
AG Ratio: 1.3 (calc) (ref 1.0–2.5)
ALT: 14 U/L (ref 9–46)
AST: 11 U/L (ref 10–35)
Albumin: 4.1 g/dL (ref 3.6–5.1)
Alkaline phosphatase (APISO): 74 U/L (ref 35–144)
BUN/Creatinine Ratio: 17 (calc) (ref 6–22)
BUN: 24 mg/dL (ref 7–25)
CO2: 22 mmol/L (ref 20–32)
Calcium: 10.1 mg/dL (ref 8.6–10.3)
Chloride: 102 mmol/L (ref 98–110)
Creat: 1.43 mg/dL — ABNORMAL HIGH (ref 0.70–1.25)
GFR, Est African American: 58 mL/min/{1.73_m2} — ABNORMAL LOW (ref >=60)
GFR, Est Non African American: 50 mL/min/{1.73_m2} — ABNORMAL LOW (ref >=60)
Globulin: 3.1 g/dL (calc) (ref 1.9–3.7)
Glucose, Bld: 234 mg/dL — ABNORMAL HIGH (ref 65–99)
Potassium: 5 mmol/L (ref 3.5–5.3)
Sodium: 137 mmol/L (ref 135–146)
Total Bilirubin: 0.8 mg/dL (ref 0.2–1.2)
Total Protein: 7.2 g/dL (ref 6.1–8.1)

## 2019-01-10 LAB — URIC ACID: Uric Acid, Serum: 6.9 mg/dL (ref 4.0–8.0)

## 2019-01-15 NOTE — Progress Notes (Signed)
Has appointment with you tomorrow, we drew some labs, FYI

## 2019-01-16 DIAGNOSIS — M86179 Other acute osteomyelitis, unspecified ankle and foot: Secondary | ICD-10-CM | POA: Diagnosis not present

## 2019-01-16 DIAGNOSIS — R0789 Other chest pain: Secondary | ICD-10-CM | POA: Diagnosis not present

## 2019-01-24 ENCOUNTER — Other Ambulatory Visit: Payer: Self-pay | Admitting: Cardiology

## 2019-01-24 ENCOUNTER — Other Ambulatory Visit (INDEPENDENT_AMBULATORY_CARE_PROVIDER_SITE_OTHER): Payer: Self-pay | Admitting: Internal Medicine

## 2019-01-28 ENCOUNTER — Other Ambulatory Visit (HOSPITAL_COMMUNITY)
Admission: RE | Admit: 2019-01-28 | Discharge: 2019-01-28 | Disposition: A | Discharge status: Home / Self Care | Payer: Medicare Other | Source: Ambulatory Visit | Attending: Internal Medicine | Admitting: Internal Medicine

## 2019-01-28 DIAGNOSIS — Z20828 Contact with and (suspected) exposure to other viral communicable diseases: Secondary | ICD-10-CM | POA: Insufficient documentation

## 2019-01-28 DIAGNOSIS — Z01812 Encounter for preprocedural laboratory examination: Secondary | ICD-10-CM | POA: Insufficient documentation

## 2019-01-28 LAB — SARS CORONAVIRUS 2 (TAT 6-24 HRS): SARS Coronavirus 2: NEGATIVE

## 2019-01-29 ENCOUNTER — Telehealth: Payer: Self-pay | Admitting: Cardiology

## 2019-01-29 NOTE — Telephone Encounter (Signed)

## 2019-01-30 ENCOUNTER — Encounter (HOSPITAL_COMMUNITY): Payer: Self-pay | Admitting: Internal Medicine

## 2019-01-30 ENCOUNTER — Ambulatory Visit (HOSPITAL_COMMUNITY)
Admission: RE | Admit: 2019-01-30 | Discharge: 2019-01-30 | Disposition: A | Discharge status: Home / Self Care | Payer: Medicare Other | Attending: Internal Medicine | Admitting: Internal Medicine

## 2019-01-30 ENCOUNTER — Encounter (HOSPITAL_COMMUNITY)
Admission: RE | Disposition: A | Discharge status: Home / Self Care | Payer: Self-pay | Source: Home / Self Care | Attending: Internal Medicine

## 2019-01-30 ENCOUNTER — Other Ambulatory Visit: Payer: Self-pay

## 2019-01-30 DIAGNOSIS — F70 Mild intellectual disabilities: Secondary | ICD-10-CM | POA: Insufficient documentation

## 2019-01-30 DIAGNOSIS — Z800 Family history of malignant neoplasm of digestive organs: Secondary | ICD-10-CM | POA: Diagnosis not present

## 2019-01-30 DIAGNOSIS — Z7982 Long term (current) use of aspirin: Secondary | ICD-10-CM | POA: Diagnosis not present

## 2019-01-30 DIAGNOSIS — D127 Benign neoplasm of rectosigmoid junction: Secondary | ICD-10-CM | POA: Diagnosis not present

## 2019-01-30 DIAGNOSIS — Z79899 Other long term (current) drug therapy: Secondary | ICD-10-CM | POA: Insufficient documentation

## 2019-01-30 DIAGNOSIS — D128 Benign neoplasm of rectum: Secondary | ICD-10-CM | POA: Insufficient documentation

## 2019-01-30 DIAGNOSIS — D125 Benign neoplasm of sigmoid colon: Secondary | ICD-10-CM

## 2019-01-30 DIAGNOSIS — I10 Essential (primary) hypertension: Secondary | ICD-10-CM | POA: Insufficient documentation

## 2019-01-30 DIAGNOSIS — Z880 Allergy status to penicillin: Secondary | ICD-10-CM | POA: Diagnosis not present

## 2019-01-30 DIAGNOSIS — E7800 Pure hypercholesterolemia, unspecified: Secondary | ICD-10-CM | POA: Insufficient documentation

## 2019-01-30 DIAGNOSIS — D123 Benign neoplasm of transverse colon: Secondary | ICD-10-CM | POA: Insufficient documentation

## 2019-01-30 DIAGNOSIS — K621 Rectal polyp: Secondary | ICD-10-CM | POA: Diagnosis not present

## 2019-01-30 DIAGNOSIS — E119 Type 2 diabetes mellitus without complications: Secondary | ICD-10-CM

## 2019-01-30 DIAGNOSIS — E1121 Type 2 diabetes mellitus with diabetic nephropathy: Secondary | ICD-10-CM

## 2019-01-30 DIAGNOSIS — Z794 Long term (current) use of insulin: Secondary | ICD-10-CM | POA: Insufficient documentation

## 2019-01-30 DIAGNOSIS — Z86010 Personal history of colonic polyps: Secondary | ICD-10-CM | POA: Diagnosis not present

## 2019-01-30 DIAGNOSIS — M109 Gout, unspecified: Secondary | ICD-10-CM | POA: Insufficient documentation

## 2019-01-30 DIAGNOSIS — D126 Benign neoplasm of colon, unspecified: Secondary | ICD-10-CM | POA: Insufficient documentation

## 2019-01-30 DIAGNOSIS — D120 Benign neoplasm of cecum: Secondary | ICD-10-CM | POA: Insufficient documentation

## 2019-01-30 DIAGNOSIS — Z09 Encounter for follow-up examination after completed treatment for conditions other than malignant neoplasm: Secondary | ICD-10-CM | POA: Diagnosis not present

## 2019-01-30 DIAGNOSIS — Z1211 Encounter for screening for malignant neoplasm of colon: Secondary | ICD-10-CM | POA: Insufficient documentation

## 2019-01-30 HISTORY — PX: COLONOSCOPY: SHX5424

## 2019-01-30 HISTORY — DX: Type 2 diabetes mellitus without complications: E11.9

## 2019-01-30 HISTORY — PX: POLYPECTOMY: SHX5525

## 2019-01-30 LAB — GLUCOSE, CAPILLARY: Glucose-Capillary: 189 mg/dL — ABNORMAL HIGH (ref 70–99)

## 2019-01-30 SURGERY — COLONOSCOPY
Anesthesia: Moderate Sedation

## 2019-01-30 MED ORDER — SODIUM CHLORIDE 0.9 % IV SOLN
INTRAVENOUS | Status: DC
  Administered 2019-01-30: 1000 mL via INTRAVENOUS

## 2019-01-30 MED ORDER — MEPERIDINE HCL 50 MG/ML IJ SOLN
INTRAMUSCULAR | Status: DC | PRN
  Administered 2019-01-30 (×2): 25 mg via INTRAVENOUS

## 2019-01-30 MED ORDER — MEPERIDINE HCL 50 MG/ML IJ SOLN
INTRAMUSCULAR | Status: AC
  Filled 2019-01-30: qty 1

## 2019-01-30 MED ORDER — MIDAZOLAM HCL 5 MG/5ML IJ SOLN
INTRAMUSCULAR | Status: AC
  Filled 2019-01-30: qty 10

## 2019-01-30 MED ORDER — MIDAZOLAM HCL 5 MG/5ML IJ SOLN
INTRAMUSCULAR | Status: DC | PRN
  Administered 2019-01-30 (×3): 2 mg via INTRAVENOUS

## 2019-01-30 MED ORDER — STERILE WATER FOR IRRIGATION IR SOLN
Status: DC | PRN
  Administered 2019-01-30: 1.5 mL

## 2019-01-30 NOTE — Op Note (Signed)
[facility] Patient Name: [name] Procedure Date: 01/30/2019 10:04 AM MRN: [id number] Date of Birth: October 30, 1950 Attending MD: Hildred Laser , MD CSN: [id number] Age: 68 Admit Type: Outpatient Procedure:                Colonoscopy Indications:              High risk colon cancer surveillance: Personal                            history of colonic polyps, Family history of colon                            cancer in multiple first-degree relatives Providers:                Hildred Laser, MD, Charlsie Quest. Theda Sers RN, RN,                            Raphael Gibney, Technician Referring MD:             Redmond [employer], MD Medicines:                Meperidine 50 mg IV, Midazolam 6 mg IV Complications:            No immediate complications. Estimated Blood Loss:     Estimated blood loss was minimal. Procedure:                Pre-Anesthesia Assessment:                           - Prior to the procedure, a History and Physical                            was performed, and patient medications and                            allergies were reviewed. The patient's tolerance of                            previous anesthesia was also reviewed. The risks                            and benefits of the procedure and the sedation                            options and risks were discussed with the patient.                            All questions were answered, and informed consent                            was obtained. Prior Anticoagulants: The patient has                            taken no previous anticoagulant or antiplatelet  agents except for aspirin. ASA Grade Assessment:                            III - A patient with severe systemic disease. After                            reviewing the risks and benefits, the patient was                            deemed in satisfactory condition to undergo the                            procedure.  After obtaining informed consent, the colonoscope                            was passed under direct vision. Throughout the                            procedure, the patient's blood pressure, pulse, and                            oxygen saturations were monitored continuously. The                            [id number]-[id number] scope was introduced through                            the anus and advanced to the the cecum, identified                            by appendiceal orifice and ileocecal valve. The                            colonoscopy was performed without difficulty. The                            patient tolerated the procedure well. The quality                            of the bowel preparation was adequate. The                            ileocecal valve, appendiceal orifice, and rectum                            were photographed. Scope In: 10:35:22 AM Scope Out: 11:24:23 AM Scope Withdrawal Time: 0 hours 38 minutes 21 seconds  Total Procedure Duration: 0 hours 49 minutes 1 second  Findings:      The perianal and digital rectal examinations were normal.      Eight polyps were found in the rectum, sigmoid colon, splenic flexure,       transverse colon and cecum. The polyps were 4 to 7 mm in size. These       polyps were  removed with a cold snare. Resection and retrieval were       complete. The pathology specimen was placed into Bottle Number 1.      A 9 mm polyp was found in the recto-sigmoid colon. The polyp was       semi-pedunculated. The polyp was removed with a hot snare. Resection and       retrieval were complete. To close a defect after mucosal resection, one       hemostatic clip was successfully placed (MR conditional). There was no       bleeding during, or at the end, of the procedure. The pathology specimen       was placed into Bottle Number 2.      No additional abnormalities were found on retroflexion. Impression:               - Eight 4 to 7 mm polyps in  the rectum, in the                            sigmoid colon, at the splenic flexure, in the                            transverse colon and in the cecum, removed with a                            cold snare. Resected and retrieved.                           - One 9 mm polyp at the recto-sigmoid colon,                            removed with a hot snare. Resected and retrieved.                            Clip (MR conditional) was placed.                           Comment; nine polyps removed. Moderate Sedation:      Moderate (conscious) sedation was administered by the endoscopy nurse       and supervised by the endoscopist. The following parameters were       monitored: oxygen saturation, heart rate, blood pressure, CO2       capnography and response to care. Total physician intraservice time was       54 minutes. Recommendation:           - Patient has a contact number available for                            emergencies. The signs and symptoms of potential                            delayed complications were discussed with the                            patient. Return to normal activities tomorrow.  Written discharge instructions were provided to the                            patient.                           - Resume previous diet today.                           - Continue present medications.                           - No aspirin, ibuprofen, naproxen, or other                            non-steroidal anti-inflammatory drugs for 7 days.                           - Await pathology results.                           - Repeat colonoscopy in 3 years. Procedure Code(s):        --- Professional ---                           [phone], Colonoscopy, flexible; with removal of                            tumor(s), polyp(s), or other lesion(s) by snare                            technique                           99153, Moderate sedation; each additional 15                             minutes intraservice time                           99153, Moderate sedation; each additional 15                            minutes intraservice time                           99153, Moderate sedation; each additional 15                            minutes intraservice time                           G0500, Moderate sedation services provided by the                            same physician or other qualified health care  professional performing a gastrointestinal                            endoscopic service that sedation supports,                            requiring the presence of an independent trained                            observer to assist in the monitoring of the                            patient's level of consciousness and physiological                            status; initial 15 minutes of intra-service time;                            patient age 10 years or older (additional time may                            be reported with [phone], as appropriate) Diagnosis Code(s):        --- Professional ---                           Z86.010, Personal history of colonic polyps                           K62.1, Rectal polyp                           K63.5, Polyp of colon                           Z80.0, Family history of malignant neoplasm of                            digestive organs CPT copyright 2019 [employer]. All rights reserved. The codes documented in this report are preliminary and upon coder review may  be revised to meet current compliance requirements. Hildred Laser, MD Hildred Laser, MD 01/30/2019 11:39:37 AM This report has been signed electronically. Number of Addenda: 0

## 2019-01-30 NOTE — H&P (Signed)
[name] is an 68 y.o. male.   Chief Complaint: Patient is here for colonoscopy. HPI: Patient is 68 year old Caucasian male who has history of colonic polyps and family history of CRC who is here for surveillance colonoscopy.  Last exam was in August 2014 with removal of 2 small polyps and these were tubular adenomas.  He denies abdominal pain or recent change in bowel habits.  He states he notices blood with his bowel movements every now and then.  He is on low-dose aspirin.  Last dose was 3 days ago Family history significant for CRC in mother who had in her 2s and he also has 1 brother who was diagnosed with colon carcinoma. Patient is deemed to be competent to provide consent for this procedure.  Past Medical History:  Diagnosis Date  . Colon polyps   . Diabetes mellitus without complication ([place])   . Gout   . High cholesterol   . History of echocardiogram 09/14/2011   EF >55%; borderline concentric LVH;   . History of stress test 06/21/2010   exercise; normal study  . Hypertension   . MET TEST 08/15/2011   moderate peak VO2 limitation at 66% predicted; mod cardiac impairment with low SV & low anaerobic threshold, mild chronotropic incompetence; low risk  . Nausea   . Renal disorder        Mild mental retardation.  Past Surgical History:  Procedure Laterality Date  . CHOLECYSTECTOMY    . COLONOSCOPY N/A 09/21/2012   Procedure: COLONOSCOPY;  Surgeon: Rogene Houston, MD;  Location: [facility];  Service: Endoscopy;  Laterality: N/A;  730  . HERNIA REPAIR     x 2  . HIP SURGERY     Rt hip age 80  . NASAL POLYP SURGERY      2-3 months ago    Family History  Problem Relation Age of Onset  . Colon cancer Brother    Social History:  reports that he has never smoked. He has never used smokeless tobacco. He reports that he does not drink alcohol or use drugs.  Allergies:  Allergies  Allergen Reactions  . Penicillins Other (See Comments)    Unknown Did it involve  swelling of the face/tongue/throat, SOB, or low BP? Unknown Did it involve sudden or severe rash/hives, skin peeling, or any reaction on the inside of your mouth or nose? Unknown Did you need to seek medical attention at a hospital or doctor's office? Unknown When did it last happen?childhood allergy If all above answers are "NO", may proceed with cephalosporin use.   Samuel [place] Dye [Iodinated Diagnostic Agents]     Sped heart reate up   . Tetanus Toxoids Rash    Medications Prior to Admission  Medication Sig Dispense Refill  . acetaminophen (TYLENOL) 500 MG tablet Take 1,000 mg by mouth every 6 (six) hours as needed. Pain    . amLODipine (NORVASC) 10 MG tablet Take 10 mg by mouth daily.  3  . aspirin 81 MG tablet Take 81 mg by mouth daily.     Marland Kitchen atorvastatin (LIPITOR) 10 MG tablet Take 10 mg by mouth at bedtime.  3  . B Complex-C (B-COMPLEX WITH VITAMIN C) tablet Take 1 tablet by mouth daily.    Marland Kitchen buPROPion (WELLBUTRIN XL) 150 MG 24 hr tablet Take 150 mg by mouth at bedtime.     . cetirizine (ZYRTEC) 10 MG tablet Take 10 mg by mouth daily.    . Cholecalciferol (VITAMIN D) 50  MCG (2000 UT) CAPS Take 2,000 Units by mouth daily.     Marland Kitchen glipiZIDE (GLUCOTROL) 5 MG tablet Take 5 mg by mouth daily.  0  . metoprolol tartrate (LOPRESSOR) 50 MG tablet TAKE 1 AND 1/2 TABLETS BY MOUTH TWICE DAILY (Patient taking differently: Take 50 mg by mouth 3 (three) times daily. ) 60 tablet 0  . Omega-3 Fatty Acids (FISH OIL) 1000 MG CAPS Take 1,000 mg by mouth 2 (two) times daily. Afternoon & bedtime.    Marland Kitchen omeprazole (PRILOSEC) 40 MG capsule TAKE ONE CAPSULE BY MOUTH ONCE DAILY. (Patient taking differently: Take 40 mg by mouth daily. ) 90 capsule 3  . pioglitazone (ACTOS) 30 MG tablet Take 30 mg by mouth daily.  3  . sertraline (ZOLOFT) 100 MG tablet Take 100 mg by mouth at bedtime.   11  . silver sulfADIAZINE (SILVADENE) 1 % cream Apply 1 application topically 2 (two) times daily as needed (applied to affected  area(s) of toe).     . sodium bicarbonate 650 MG tablet Take 650 mg by mouth 2 (two) times daily.  3  . TOUJEO MAX SOLOSTAR 300 UNIT/ML SOPN Inject 10 Units into the skin daily.     Marland Kitchen ULORIC 80 MG TABS Take 80 mg by mouth daily.   99  . Vitamin D, Ergocalciferol, (DRISDOL) 50000 units CAPS capsule Take 50,000 Units by mouth every Sunday.   6  . doxycycline (VIBRA-TABS) 100 MG tablet Take 1 tablet (100 mg total) by mouth 2 (two) times daily. (Patient not taking: Reported on 01/28/2019) 60 tablet 0  . loperamide (IMODIUM) 2 MG capsule TAKE ONE CAPSULE BY MOUTH FOUR TIMES DAILY AS NEEDED FOR DIARRHEA OR LOOSE STOOLS (Patient taking differently: Take 2-4 mg by mouth 3 (three) times daily as needed for diarrhea or loose stools. ) 120 capsule 3    No results found for this or any previous visit (from the past 48 hour(s)). No results found.  Review of Systems  Blood pressure 134/71, pulse 91, temperature (!) 96.8 F (36 C), temperature source Axillary, resp. rate 13, height 6' 5"  (1.956 m), weight 113.4 kg, SpO2 100 %. Physical Exam  Constitutional: He appears well-developed and well-nourished.  HENT:  Mouth/Throat: Oropharynx is clear and moist.  Eyes: Conjunctivae are normal. No scleral icterus.  Neck: No thyromegaly present.  Cardiovascular: Normal rate, regular rhythm and normal heart sounds.  No murmur heard. Respiratory: Effort normal and breath sounds normal.  GI:  Abdomen is symmetrical.  Small scar in epigastric region and horizontal supraumbilical scar.  Abdomen is soft and nontender with organomegaly or masses.  Musculoskeletal:        General: No edema.  Lymphadenopathy:    He has no cervical adenopathy.  Neurological: He is alert.  Skin: Skin is warm and dry.     Assessment/Plan History of colonic adenomas. Family history of CRC in 2 first-degree relatives(mother and a brother). Surveillance colonoscopy.  Hildred Laser, MD 01/30/2019, 10:25 AM

## 2019-01-30 NOTE — Discharge Instructions (Signed)
No aspirin or NSAIDs for 1 week. Resume other medications as before. Resume usual diet. Remember you cannot have an MRI until clip has passed. Physician will call with biopsy results.   Colonoscopy, Adult, Care After This sheet gives you information about how to care for yourself after your procedure. Your doctor may also give you more specific instructions. If you have problems or questions, call your doctor. What can I expect after the procedure? After the procedure, it is common to have:  A small amount of blood in your poop for 24 hours.  Some gas.  Mild cramping or bloating in your belly. Follow these instructions at home: General instructions  For the first 24 hours after the procedure: ? Do not drive or use machinery. ? Do not sign important documents. ? Do not drink alcohol. ? Do your daily activities more slowly than normal. ? Eat foods that are soft and easy to digest.  Take over-the-counter or prescription medicines only as told by your doctor. To help cramping and bloating:   Try walking around.  Put heat on your belly (abdomen) as told by your doctor. Use a heat source that your doctor recommends, such as a moist heat pack or a heating pad. ? Put a towel between your skin and the heat source. ? Leave the heat on for 20-30 minutes. ? Remove the heat if your skin turns bright red. This is especially important if you cannot feel pain, heat, or cold. You can get burned. Eating and drinking   Drink enough fluid to keep your pee (urine) clear or pale yellow.  Return to your normal diet as told by your doctor. Avoid heavy or fried foods that are hard to digest.  Avoid drinking alcohol for as long as told by your doctor. Contact a doctor if:  You have blood in your poop (stool) 2-3 days after the procedure. Get help right away if:  You have more than a small amount of blood in your poop.  You see large clumps of tissue (blood clots) in your poop.  Your belly  is swollen.  You feel sick to your stomach (nauseous).  You throw up (vomit).  You have a fever.  You have belly pain that gets worse, and medicine does not help your pain. Summary  After the procedure, it is common to have a small amount of blood in your poop. You may also have mild cramping and bloating in your belly.  For the first 24 hours after the procedure, do not drive or use machinery, do not sign important documents, and do not drink alcohol.  Get help right away if you have a lot of blood in your poop, feel sick to your stomach, have a fever, or have more belly pain. This information is not intended to replace advice given to you by your health care provider. Make sure you discuss any questions you have with your health care provider. Document Released: 03/05/2010 Document Revised: 12/01/2016 Document Reviewed: 10/26/2015 Elsevier Patient Education  2020 [employer].  Colon Polyps  Polyps are tissue growths inside the body. Polyps can grow in many places, including the large intestine (colon). A polyp may be a round bump or a mushroom-shaped growth. You could have one polyp or several. Most colon polyps are noncancerous (benign). However, some colon polyps can become cancerous over time. Finding and removing the polyps early can help prevent this. What are the causes? The exact cause of colon polyps is not known. What increases  the risk? You are more likely to develop this condition if you:  Have a family history of colon cancer or colon polyps.  Are older than 49 or older than 45 if you are African American.  Have inflammatory bowel disease, such as ulcerative colitis or Crohn's disease.  Have certain hereditary conditions, such as: ? Familial adenomatous polyposis. ? Lynch syndrome. ? Turcot syndrome. ? Peutz-Jeghers syndrome.  Are overweight.  Smoke cigarettes.  Do not get enough exercise.  Drink too much alcohol.  Eat a diet that is high in fat and  red meat and low in fiber.  Had childhood cancer that was treated with abdominal radiation. What are the signs or symptoms? Most polyps do not cause symptoms. If you have symptoms, they may include:  Blood coming from your rectum when having a bowel movement.  Blood in your stool. The stool may look dark red or black.  Abdominal pain.  A change in bowel habits, such as constipation or diarrhea. How is this diagnosed? This condition is diagnosed with a colonoscopy. This is a procedure in which a lighted, flexible scope is inserted into the anus and then passed into the colon to examine the area. Polyps are sometimes found when a colonoscopy is done as part of routine cancer screening tests. How is this treated? Treatment for this condition involves removing any polyps that are found. Most polyps can be removed during a colonoscopy. Those polyps will then be tested for cancer. Additional treatment may be needed depending on the results of testing. Follow these instructions at home: Lifestyle  Maintain a healthy weight, or lose weight if recommended by your health care provider.  Exercise every day or as told by your health care provider.  Do not use any products that contain nicotine or tobacco, such as cigarettes and e-cigarettes. If you need help quitting, ask your health care provider.  If you drink alcohol, limit how much you have: ? 0-1 drink a day for women. ? 0-2 drinks a day for men.  Be aware of how much alcohol is in your drink. In the [place] one drink equals one 12 oz bottle of beer (355 mL), one 5 oz glass of wine (148 mL), or one 1 oz shot of hard liquor (44 mL). Eating and drinking   Eat foods that are high in fiber, such as fruits, vegetables, and whole grains.  Eat foods that are high in calcium and vitamin D, such as milk, cheese, yogurt, eggs, liver, fish, and broccoli.  Limit foods that are high in fat, such as fried foods and desserts.  Limit the amount of  red meat and processed meat you eat, such as hot dogs, sausage, bacon, and lunch meats. General instructions  Keep all follow-up visits as told by your health care provider. This is important. ? This includes having regularly scheduled colonoscopies. ? Talk to your health care provider about when you need a colonoscopy. Contact a health care provider if:  You have new or worsening bleeding during a bowel movement.  You have new or increased blood in your stool.  You have a change in bowel habits.  You lose weight for no known reason. Summary  Polyps are tissue growths inside the body. Polyps can grow in many places, including the colon.  Most colon polyps are noncancerous (benign), but some can become cancerous over time.  This condition is diagnosed with a colonoscopy.  Treatment for this condition involves removing any polyps that are found. Most polyps  can be removed during a colonoscopy. This information is not intended to replace advice given to you by your health care provider. Make sure you discuss any questions you have with your health care provider. Document Released: 10/28/2003 Document Revised: 05/18/2017 Document Reviewed: 05/18/2017 Elsevier Patient Education  2020 [employer].

## 2019-01-31 ENCOUNTER — Encounter: Payer: Self-pay | Admitting: Orthopedic Surgery

## 2019-01-31 ENCOUNTER — Ambulatory Visit (INDEPENDENT_AMBULATORY_CARE_PROVIDER_SITE_OTHER): Payer: Medicare Other | Admitting: Orthopedic Surgery

## 2019-01-31 ENCOUNTER — Other Ambulatory Visit: Payer: Self-pay

## 2019-01-31 ENCOUNTER — Other Ambulatory Visit: Payer: Self-pay | Admitting: Physician Assistant

## 2019-01-31 VITALS — Ht 77.0 in | Wt 250.0 lb

## 2019-01-31 DIAGNOSIS — M869 Osteomyelitis, unspecified: Secondary | ICD-10-CM

## 2019-01-31 LAB — SURGICAL PATHOLOGY

## 2019-01-31 MED ORDER — DOXYCYCLINE HYCLATE 100 MG PO TABS: 100.0000 mg | ORAL_TABLET | Freq: Two times a day (BID) | ORAL | 0 refills | Status: DC

## 2019-02-01 NOTE — Progress Notes (Signed)
Office Visit Note   Patient: [name]           Date of Birth: 10-17-1950           MRN: [id number] Visit Date: 01/31/2019              Requested by: Redmond School, MD [address] [place],  [place] [address] PCP: Redmond School, MD  Chief Complaint  Patient presents with  . Right Foot - Follow-up    GT ulcer       HPI: Patient is a 68 year old gentleman with ulceration osteomyelitis right great toe.  Previous uric acid was 6.9 hemoglobin A1c 8.6.  Assessment & Plan: Visit Diagnoses:  1. Osteomyelitis of great toe of right foot ([place])     Plan: We will plan for amputation of the right great toe patient wants to wait until after Christmas plan for surgery on December 30 as an outpatient.  Follow-Up Instructions: Return in about 1 week (around 02/07/2019) for Follow-up 1 week postoperatively.Manson Passey Exam  Patient is alert, oriented, no adenopathy, well-dressed, normal affect, normal respiratory effort. Examination patient has a good dorsalis pedis pulse he has cellulitis sausage digit swelling and dermatitis of the right great toe.  MRI scan shows destructive osteomyelitis of the proximal phalanx and tuft of the right great toe.  Imaging: No results found. No images are attached to the encounter.  Labs: Lab Results  Component Value Date   HGBA1C 8.6 (H) 01/09/2019   LABURIC 6.9 01/09/2019   REPTSTATUS 08/30/2011 FINAL 08/28/2011   CULT  08/28/2011    Multiple bacterial morphotypes present, none predominant. Suggest appropriate recollection if clinically indicated.     Lab Results  Component Value Date   ALBUMIN 4.4 05/14/2013   ALBUMIN 3.8 08/28/2011   ALBUMIN 3.7 07/20/2010   LABURIC 6.9 01/09/2019    No results found for: MG No results found for: VD25OH  No results found for: PREALBUMIN CBC EXTENDED Latest Ref Rng & Units 01/09/2019 01/08/2018 12/26/2016  WBC 3.8 - 10.8 Thousand/uL 6.0 6.6 5.1  RBC 4.20 - 5.80 Million/uL 4.77 4.90  4.67  HGB 13.2 - 17.1 g/dL 14.0 14.9 14.2  HCT 38.5 - 50.0 % 41.6 43.7 40.8  PLT 140 - 400 Thousand/uL 217 228 197  NEUTROABS 1,500 - 7,800 cells/uL 3,852 4,653 3,121  LYMPHSABS 850 - 3,900 cells/uL 1,410 1,168 1,301     Body mass index is 29.65 kg/m.  Orders:  No orders of the defined types were placed in this encounter.  Meds ordered this encounter  Medications  . doxycycline (VIBRA-TABS) 100 MG tablet    Sig: Take 1 tablet (100 mg total) by mouth 2 (two) times daily.    Dispense:  60 tablet    Refill:  0     Procedures: No procedures performed  Clinical Data: No additional findings.  ROS:  All other systems negative, except as noted in the HPI. Review of Systems  Objective: Vital Signs: Ht _0  (1.956 m)   Wt 250 lb (113.4 kg)   BMI 29.65 kg/m   Specialty Comments:  No specialty comments available.  PMFS History: Patient Active Problem List   Diagnosis Date Noted  . DM (diabetes mellitus) ([place]) 01/30/2019  . Fatty liver 01/09/2019  . Thrombocytopenia, unspecified ([place]) 05/14/2013  . Colon adenoma 09/13/2012  . Family hx of colon cancer 09/13/2012  . Anxiety 09/06/2012  . Mental retardation 09/06/2012  . Chest pain 09/06/2012  . GERD (gastroesophageal reflux disease)  03/08/2012  . Nausea 09/06/2011  . Gout 09/06/2011  . Diarrhea 09/06/2011  . Hypertension 03/08/2011   Past Medical History:  Diagnosis Date  . Colon polyps   . Diabetes mellitus without complication ([place])   . Gout   . High cholesterol   . History of echocardiogram 09/14/2011   EF >55%; borderline concentric LVH;   . History of stress test 06/21/2010   exercise; normal study  . Hypertension   . MET TEST 08/15/2011   moderate peak VO2 limitation at 66% predicted; mod cardiac impairment with low SV & low anaerobic threshold, mild chronotropic incompetence; low risk  . Nausea   . Renal disorder     Family History  Problem Relation Age of Onset  . Colon cancer Brother     Past  Surgical History:  Procedure Laterality Date  . CHOLECYSTECTOMY    . COLONOSCOPY N/A 09/21/2012   Procedure: COLONOSCOPY;  Surgeon: Rogene Houston, MD;  [facility];  Service: Endoscopy;  Laterality: N/A;  730  . COLONOSCOPY N/A 01/30/2019   Procedure: COLONOSCOPY;  Surgeon: Rogene Houston, MD;  [facility];  Service: Endoscopy;  Laterality: N/A;  10:30  . HERNIA REPAIR     x 2  . HIP SURGERY     Rt hip age 80  . NASAL POLYP SURGERY      2-3 months ago  . POLYPECTOMY  01/30/2019   Procedure: POLYPECTOMY;  Surgeon: Rogene Houston, MD;  [facility];  Service: Endoscopy;;  proximal transverse colon, cecal   Social History   Occupational History  . Not on file  Tobacco Use  . Smoking status: Never Smoker  . Smokeless tobacco: Never Used  Substance and Sexual Activity  . Alcohol use: No  . Drug use: No  . Sexual activity: Not on file

## 2019-02-05 ENCOUNTER — Other Ambulatory Visit: Payer: Self-pay

## 2019-02-05 DIAGNOSIS — R809 Proteinuria, unspecified: Secondary | ICD-10-CM | POA: Diagnosis not present

## 2019-02-05 DIAGNOSIS — E559 Vitamin D deficiency, unspecified: Secondary | ICD-10-CM | POA: Diagnosis not present

## 2019-02-05 DIAGNOSIS — Z79899 Other long term (current) drug therapy: Secondary | ICD-10-CM | POA: Diagnosis not present

## 2019-02-05 DIAGNOSIS — N1831 Chronic kidney disease, stage 3a: Secondary | ICD-10-CM | POA: Diagnosis not present

## 2019-02-05 DIAGNOSIS — D631 Anemia in chronic kidney disease: Secondary | ICD-10-CM | POA: Diagnosis not present

## 2019-02-06 ENCOUNTER — Other Ambulatory Visit: Payer: Self-pay

## 2019-02-11 ENCOUNTER — Encounter (HOSPITAL_COMMUNITY): Payer: Self-pay | Admitting: Orthopedic Surgery

## 2019-02-11 ENCOUNTER — Other Ambulatory Visit: Payer: Self-pay

## 2019-02-11 ENCOUNTER — Other Ambulatory Visit (HOSPITAL_COMMUNITY)
Admission: RE | Admit: 2019-02-11 | Discharge: 2019-02-11 | Disposition: A | Discharge status: Home / Self Care | Payer: Medicare Other | Source: Ambulatory Visit | Attending: Orthopedic Surgery | Admitting: Orthopedic Surgery

## 2019-02-11 ENCOUNTER — Other Ambulatory Visit: Payer: Self-pay | Admitting: Physician Assistant

## 2019-02-11 DIAGNOSIS — Z20828 Contact with and (suspected) exposure to other viral communicable diseases: Secondary | ICD-10-CM | POA: Insufficient documentation

## 2019-02-11 DIAGNOSIS — Z01812 Encounter for preprocedural laboratory examination: Secondary | ICD-10-CM | POA: Insufficient documentation

## 2019-02-11 LAB — SARS CORONAVIRUS 2 (TAT 6-24 HRS): SARS Coronavirus 2: NEGATIVE

## 2019-02-11 NOTE — Progress Notes (Signed)
SDW-pre-op call completed by pt cousin, Vanessa Covington [facility]). Cousin denies that pt C/O SOB and chest pain. Cousin stated that pt is under the care of Dr. Harl Bowie, Cardiology and Dr. Gerarda Fraction, PCP. Cousin denies that pt had a cardiac cath. Cousin denies that pt had a chest x ray in the last year. Cousin stated that pt may have had an EKG and [facility]; records requested. Cousin stated that labs were recently drawn at [facility]; records requested .Cousin stated that pt has not taken Aspirin since recent colonoscopy. Cousin made aware to have pt stop taking  vitamins, fish oil and herbal medications. Do not take any NSAIDs ie: Ibuprofen, Advil, Naproxen (Aleve), Motrin, BC and Goody Powder. Cousin made aware to have pt hold Glipizide and Actos on DOS. Cousin made aware to have pt check CBG every 2 hours prior to arrival to hospital on DOS. Cousin made aware to have pt take 5 units of Toujeo insulin on morning of surgery if CBG > 70. Cousin made aware to hold insulin for CBG <70 and treat a CBG < 70 with 4 ounces of apple or cranberry juice, wait 15 minutes after intervention to recheck BG, if BG remains < 70, call Short Stay unit to speak with a nurse. Cousin verbalized understanding of all pre-op instructions.

## 2019-02-12 NOTE — Anesthesia Preprocedure Evaluation (Addendum)
Anesthesia Evaluation  Patient identified by MRN, date of birth, ID band Patient awake    Reviewed: Allergy & Precautions, NPO status , Patient's Chart, lab work & pertinent test results, reviewed documented beta blocker date and time   History of Anesthesia Complications Negative for: history of anesthetic complications  Airway Mallampati: II  TM Distance: >3 FB Neck ROM: Full    Dental  (+) Edentulous Upper, Edentulous Lower   Pulmonary neg pulmonary ROS,    Pulmonary exam normal        Cardiovascular hypertension, Pt. on medications and Pt. on home beta blockers Normal cardiovascular exam     Neuro/Psych Anxiety Depression negative neurological ROS     GI/Hepatic Neg liver ROS, GERD  Controlled and Medicated,  Endo/Other  diabetes, Poorly Controlled, Type 2, Oral Hypoglycemic Agents  Renal/GU negative Renal ROS  negative genitourinary   Musculoskeletal  (+) Arthritis , Right great toe osteomyelitis   Abdominal   Peds  Hematology negative hematology ROS (+)   Anesthesia Other Findings Day of surgery medications reviewed with patient.  Reproductive/Obstetrics negative OB ROS                            Anesthesia Physical Anesthesia Plan  ASA: III  Anesthesia Plan: Regional   Post-op Pain Management:    Induction:   PONV Risk Score and Plan: 2 and Treatment may vary due to age or medical condition, Propofol infusion, Ondansetron and Midazolam  Airway Management Planned: Natural Airway and Simple Face Mask  Additional Equipment: None  Intra-op Plan:   Post-operative Plan:   Informed Consent: I have reviewed the patients History and Physical, chart, labs and discussed the procedure including the risks, benefits and alternatives for the proposed anesthesia with the patient or authorized representative who has indicated his/her understanding and acceptance.       Plan  Discussed with: CRNA  Anesthesia Plan Comments:        Anesthesia Quick Evaluation

## 2019-02-13 ENCOUNTER — Other Ambulatory Visit: Payer: Self-pay

## 2019-02-13 ENCOUNTER — Ambulatory Visit (HOSPITAL_COMMUNITY): Payer: Medicare Other | Admitting: Certified Registered Nurse Anesthetist

## 2019-02-13 ENCOUNTER — Encounter (HOSPITAL_COMMUNITY)
Admission: RE | Disposition: A | Discharge status: Home / Self Care | Payer: Self-pay | Source: Home / Self Care | Attending: Orthopedic Surgery

## 2019-02-13 ENCOUNTER — Encounter (HOSPITAL_COMMUNITY): Payer: Self-pay | Admitting: Orthopedic Surgery

## 2019-02-13 ENCOUNTER — Ambulatory Visit (HOSPITAL_COMMUNITY)
Admission: RE | Admit: 2019-02-13 | Discharge: 2019-02-13 | Disposition: A | Discharge status: Home / Self Care | Payer: Medicare Other | Attending: Orthopedic Surgery | Admitting: Orthopedic Surgery

## 2019-02-13 DIAGNOSIS — K219 Gastro-esophageal reflux disease without esophagitis: Secondary | ICD-10-CM | POA: Diagnosis not present

## 2019-02-13 DIAGNOSIS — L97519 Non-pressure chronic ulcer of other part of right foot with unspecified severity: Secondary | ICD-10-CM | POA: Diagnosis not present

## 2019-02-13 DIAGNOSIS — Z794 Long term (current) use of insulin: Secondary | ICD-10-CM | POA: Insufficient documentation

## 2019-02-13 DIAGNOSIS — M869 Osteomyelitis, unspecified: Secondary | ICD-10-CM | POA: Diagnosis not present

## 2019-02-13 DIAGNOSIS — F419 Anxiety disorder, unspecified: Secondary | ICD-10-CM | POA: Insufficient documentation

## 2019-02-13 DIAGNOSIS — Z79899 Other long term (current) drug therapy: Secondary | ICD-10-CM | POA: Diagnosis not present

## 2019-02-13 DIAGNOSIS — E11621 Type 2 diabetes mellitus with foot ulcer: Secondary | ICD-10-CM | POA: Insufficient documentation

## 2019-02-13 DIAGNOSIS — N1831 Chronic kidney disease, stage 3a: Secondary | ICD-10-CM | POA: Insufficient documentation

## 2019-02-13 DIAGNOSIS — E7800 Pure hypercholesterolemia, unspecified: Secondary | ICD-10-CM | POA: Diagnosis not present

## 2019-02-13 DIAGNOSIS — I10 Essential (primary) hypertension: Secondary | ICD-10-CM | POA: Insufficient documentation

## 2019-02-13 DIAGNOSIS — F329 Major depressive disorder, single episode, unspecified: Secondary | ICD-10-CM | POA: Insufficient documentation

## 2019-02-13 DIAGNOSIS — E1169 Type 2 diabetes mellitus with other specified complication: Secondary | ICD-10-CM | POA: Insufficient documentation

## 2019-02-13 DIAGNOSIS — Z7982 Long term (current) use of aspirin: Secondary | ICD-10-CM | POA: Diagnosis not present

## 2019-02-13 DIAGNOSIS — M8610 Other acute osteomyelitis, unspecified site: Secondary | ICD-10-CM

## 2019-02-13 HISTORY — DX: Unspecified osteoarthritis, unspecified site: M19.90

## 2019-02-13 HISTORY — DX: Fatty (change of) liver, not elsewhere classified: K76.0

## 2019-02-13 HISTORY — PX: AMPUTATION: SHX166

## 2019-02-13 HISTORY — DX: Thrombocytopenia, unspecified: D69.6

## 2019-02-13 HISTORY — DX: Osteomyelitis, unspecified: M86.9

## 2019-02-13 HISTORY — DX: Anxiety disorder, unspecified: F41.9

## 2019-02-13 HISTORY — DX: Depression, unspecified: F32.A

## 2019-02-13 HISTORY — DX: Gastro-esophageal reflux disease without esophagitis: K21.9

## 2019-02-13 LAB — CBC
HCT: 44.1 % (ref 39.0–52.0)
Hemoglobin: 14.7 g/dL (ref 13.0–17.0)
MCH: 29.5 pg (ref 26.0–34.0)
MCHC: 33.3 g/dL (ref 30.0–36.0)
MCV: 88.4 fL (ref 80.0–100.0)
Platelets: 197 10*3/uL (ref 150–400)
RBC: 4.99 MIL/uL (ref 4.22–5.81)
RDW: 13.7 % (ref 11.5–15.5)
WBC: 6.1 10*3/uL (ref 4.0–10.5)
nRBC: 0 % (ref 0.0–0.2)

## 2019-02-13 LAB — COMPREHENSIVE METABOLIC PANEL
ALT: 19 U/L (ref 0–44)
AST: 17 U/L (ref 15–41)
Albumin: 3.5 g/dL (ref 3.5–5.0)
Alkaline Phosphatase: 68 U/L (ref 38–126)
Anion gap: 10 (ref 5–15)
BUN: 22 mg/dL (ref 8–23)
CO2: 26 mmol/L (ref 22–32)
Calcium: 10.1 mg/dL (ref 8.9–10.3)
Chloride: 99 mmol/L (ref 98–111)
Creatinine, Ser: 1.39 mg/dL — ABNORMAL HIGH (ref 0.61–1.24)
GFR calc Af Amer: >60 mL/min (ref >60)
GFR calc non Af Amer: 52 mL/min — ABNORMAL LOW (ref >60)
Glucose, Bld: 244 mg/dL — ABNORMAL HIGH (ref 70–99)
Potassium: 4 mmol/L (ref 3.5–5.1)
Sodium: 135 mmol/L (ref 135–145)
Total Bilirubin: 1 mg/dL (ref 0.3–1.2)
Total Protein: 6.7 g/dL (ref 6.5–8.1)

## 2019-02-13 LAB — GLUCOSE, CAPILLARY
Glucose-Capillary: 233 mg/dL — ABNORMAL HIGH (ref 70–99)
Glucose-Capillary: 234 mg/dL — ABNORMAL HIGH (ref 70–99)

## 2019-02-13 LAB — SURGICAL PCR SCREEN
MRSA, PCR: NEGATIVE
Staphylococcus aureus: NEGATIVE

## 2019-02-13 SURGERY — AMPUTATION DIGIT
Anesthesia: Regional | Laterality: Right

## 2019-02-13 MED ORDER — ONDANSETRON HCL 4 MG/2ML IJ SOLN
INTRAMUSCULAR | Status: AC
  Filled 2019-02-13: qty 4

## 2019-02-13 MED ORDER — PHENYLEPHRINE 40 MCG/ML (10ML) SYRINGE FOR IV PUSH (FOR BLOOD PRESSURE SUPPORT)
PREFILLED_SYRINGE | INTRAVENOUS | Status: AC
  Filled 2019-02-13: qty 10

## 2019-02-13 MED ORDER — OXYCODONE HCL 5 MG/5ML PO SOLN: 5.0000 mg | Freq: Once | ORAL | Status: DC | PRN

## 2019-02-13 MED ORDER — LACTATED RINGERS IV SOLN: INTRAVENOUS | Status: DC

## 2019-02-13 MED ORDER — POVIDONE-IODINE 10 % EX SWAB: 2.0000 "application " | Freq: Once | CUTANEOUS | Status: DC

## 2019-02-13 MED ORDER — PROPOFOL 10 MG/ML IV BOLUS
INTRAVENOUS | Status: AC
  Filled 2019-02-13: qty 20

## 2019-02-13 MED ORDER — FENTANYL CITRATE (PF) 250 MCG/5ML IJ SOLN
INTRAMUSCULAR | Status: AC
  Filled 2019-02-13: qty 5

## 2019-02-13 MED ORDER — PROPOFOL 500 MG/50ML IV EMUL
INTRAVENOUS | Status: DC | PRN
  Administered 2019-02-13: 100 ug/kg/min via INTRAVENOUS

## 2019-02-13 MED ORDER — MIDAZOLAM HCL 2 MG/2ML IJ SOLN: 1.0000 mg | Freq: Once | INTRAMUSCULAR | Status: AC

## 2019-02-13 MED ORDER — FENTANYL CITRATE (PF) 100 MCG/2ML IJ SOLN: 25.0000 ug | INTRAMUSCULAR | Status: DC | PRN

## 2019-02-13 MED ORDER — ACETAMINOPHEN 500 MG PO TABS
1000.0000 mg | ORAL_TABLET | Freq: Once | ORAL | Status: AC
  Administered 2019-02-13: 08:00:00 1000 mg via ORAL
  Filled 2019-02-13: qty 2

## 2019-02-13 MED ORDER — DEXAMETHASONE SODIUM PHOSPHATE 10 MG/ML IJ SOLN
INTRAMUSCULAR | Status: AC
  Filled 2019-02-13: qty 1

## 2019-02-13 MED ORDER — MIDAZOLAM HCL 2 MG/2ML IJ SOLN
INTRAMUSCULAR | Status: AC
  Administered 2019-02-13: 1 mg via INTRAVENOUS
  Filled 2019-02-13: qty 2

## 2019-02-13 MED ORDER — LIDOCAINE 2% (20 MG/ML) 5 ML SYRINGE
INTRAMUSCULAR | Status: AC
  Filled 2019-02-13: qty 5

## 2019-02-13 MED ORDER — OXYCODONE HCL 5 MG PO TABS: 5.0000 mg | ORAL_TABLET | Freq: Once | ORAL | Status: DC | PRN

## 2019-02-13 MED ORDER — PHENYLEPHRINE 40 MCG/ML (10ML) SYRINGE FOR IV PUSH (FOR BLOOD PRESSURE SUPPORT)
PREFILLED_SYRINGE | INTRAVENOUS | Status: DC | PRN
  Administered 2019-02-13: 120 ug via INTRAVENOUS

## 2019-02-13 MED ORDER — BUPIVACAINE HCL 0.5 % IJ SOLN
INTRAMUSCULAR | Status: DC | PRN
  Administered 2019-02-13: 25 mL

## 2019-02-13 MED ORDER — ONDANSETRON HCL 4 MG/2ML IJ SOLN
INTRAMUSCULAR | Status: DC | PRN
  Administered 2019-02-13: 4 mg via INTRAVENOUS

## 2019-02-13 MED ORDER — CHLORHEXIDINE GLUCONATE 4 % EX LIQD: 60.0000 mL | Freq: Once | CUTANEOUS | Status: DC

## 2019-02-13 MED ORDER — OXYCODONE-ACETAMINOPHEN 5-325 MG PO TABS: 1.0000 | ORAL_TABLET | ORAL | 0 refills | Status: DC | PRN

## 2019-02-13 MED ORDER — GLYCOPYRROLATE PF 0.2 MG/ML IJ SOSY
PREFILLED_SYRINGE | INTRAMUSCULAR | Status: AC
  Filled 2019-02-13: qty 1

## 2019-02-13 MED ORDER — 0.9 % SODIUM CHLORIDE (POUR BTL) OPTIME
TOPICAL | Status: DC | PRN
  Administered 2019-02-13: 1000 mL

## 2019-02-13 MED ORDER — FENTANYL CITRATE (PF) 100 MCG/2ML IJ SOLN
50.0000 ug | Freq: Once | INTRAMUSCULAR | Status: AC
  Administered 2019-02-13: 09:00:00 50 ug via INTRAVENOUS

## 2019-02-13 MED ORDER — PROMETHAZINE HCL 25 MG/ML IJ SOLN: 6.2500 mg | INTRAMUSCULAR | Status: DC | PRN

## 2019-02-13 MED ORDER — CEFAZOLIN SODIUM-DEXTROSE 2-4 GM/100ML-% IV SOLN
2.0000 g | INTRAVENOUS | Status: AC
  Administered 2019-02-13: 2 g via INTRAVENOUS
  Filled 2019-02-13: qty 100

## 2019-02-13 MED ORDER — FENTANYL CITRATE (PF) 100 MCG/2ML IJ SOLN
INTRAMUSCULAR | Status: AC
  Filled 2019-02-13: qty 2

## 2019-02-13 MED ORDER — ONDANSETRON HCL 4 MG/2ML IJ SOLN
INTRAMUSCULAR | Status: AC
  Filled 2019-02-13: qty 2

## 2019-02-13 SURGICAL SUPPLY — 33 items
BLADE SURG 21 STRL SS (BLADE) ×3 IMPLANT
BNDG CMPR 9X4 STRL LF SNTH (GAUZE/BANDAGES/DRESSINGS)
BNDG COHESIVE 4X5 TAN STRL (GAUZE/BANDAGES/DRESSINGS) ×3 IMPLANT
BNDG COHESIVE 6X5 TAN STRL LF (GAUZE/BANDAGES/DRESSINGS) ×2 IMPLANT
BNDG ESMARK 4X9 LF (GAUZE/BANDAGES/DRESSINGS) IMPLANT
BNDG GAUZE ELAST 4 BULKY (GAUZE/BANDAGES/DRESSINGS) ×3 IMPLANT
COVER SURGICAL LIGHT HANDLE (MISCELLANEOUS) ×6 IMPLANT
COVER WAND RF STERILE (DRAPES) ×3 IMPLANT
DRAPE U-SHAPE 47X51 STRL (DRAPES) ×3 IMPLANT
DRSG ADAPTIC 3X8 NADH LF (GAUZE/BANDAGES/DRESSINGS) IMPLANT
DRSG EMULSION OIL 3X3 NADH (GAUZE/BANDAGES/DRESSINGS) ×2 IMPLANT
DRSG PAD ABDOMINAL 8X10 ST (GAUZE/BANDAGES/DRESSINGS) ×3 IMPLANT
DURAPREP 26ML APPLICATOR (WOUND CARE) ×3 IMPLANT
ELECT REM PT RETURN 9FT ADLT (ELECTROSURGICAL) ×3
ELECTRODE REM PT RTRN 9FT ADLT (ELECTROSURGICAL) ×1 IMPLANT
GAUZE SPONGE 4X4 12PLY STRL (GAUZE/BANDAGES/DRESSINGS) IMPLANT
GAUZE SPONGE 4X4 16PLY XRAY LF (GAUZE/BANDAGES/DRESSINGS) ×2 IMPLANT
GLOVE BIOGEL PI IND STRL 9 (GLOVE) ×1 IMPLANT
GLOVE BIOGEL PI INDICATOR 9 (GLOVE) ×2
GLOVE SURG ORTHO 9.0 STRL STRW (GLOVE) ×3 IMPLANT
GOWN STRL REUS W/ TWL XL LVL3 (GOWN DISPOSABLE) ×2 IMPLANT
GOWN STRL REUS W/TWL XL LVL3 (GOWN DISPOSABLE) ×6
KIT BASIN OR (CUSTOM PROCEDURE TRAY) ×3 IMPLANT
KIT TURNOVER KIT B (KITS) ×3 IMPLANT
MANIFOLD NEPTUNE II (INSTRUMENTS) ×3 IMPLANT
NEEDLE 22X1 1/2 (OR ONLY) (NEEDLE) IMPLANT
NS IRRIG 1000ML POUR BTL (IV SOLUTION) ×3 IMPLANT
PACK ORTHO EXTREMITY (CUSTOM PROCEDURE TRAY) ×3 IMPLANT
PAD ABD 8X10 STRL (GAUZE/BANDAGES/DRESSINGS) ×2 IMPLANT
PAD ARMBOARD 7.5X6 YLW CONV (MISCELLANEOUS) ×6 IMPLANT
SUT ETHILON 2 0 PSLX (SUTURE) ×3 IMPLANT
SYR CONTROL 10ML LL (SYRINGE) IMPLANT
TOWEL GREEN STERILE (TOWEL DISPOSABLE) ×3 IMPLANT

## 2019-02-13 NOTE — Anesthesia Postprocedure Evaluation (Signed)
Anesthesia Post Note  Patient: [name]  Procedure(s) Performed: RIGHT GREAT TOE AMPUTATION (Right )     Patient location during evaluation: PACU Anesthesia Type: Regional and MAC Level of consciousness: awake and alert, patient cooperative and oriented Pain management: pain level controlled Vital Signs Assessment: post-procedure vital signs reviewed and stable Respiratory status: spontaneous breathing, respiratory function stable and nonlabored ventilation Cardiovascular status: blood pressure returned to baseline and stable Postop Assessment: no apparent nausea or vomiting Anesthetic complications: no    Last Vitals:  Vitals:   02/13/19 1013 02/13/19 1028  BP: (!) 142/65 (!) 157/79  Pulse: 93 95  Resp: 19 19  Temp:  36.7 C  SpO2: 98% 99%    Last Pain:  Vitals:   02/13/19 1030  TempSrc:   PainSc: 0-No pain                 Sathvik Tiedt,E. Yen Wandell

## 2019-02-13 NOTE — Anesthesia Procedure Notes (Signed)
Procedure Name: MAC Date/Time: 02/13/2019 9:30 AM Performed by: Barrington Ellison, CRNA Pre-anesthesia Checklist: Patient identified, Emergency Drugs available, Suction available and Patient being monitored Patient Re-evaluated:Patient Re-evaluated prior to induction Oxygen Delivery Method: Simple face mask

## 2019-02-13 NOTE — Transfer of Care (Signed)
Immediate Anesthesia Transfer of Care Note  Patient: [name]  Procedure(s) Performed: RIGHT GREAT TOE AMPUTATION (Right )  Patient Location: PACU  Anesthesia Type:MAC and Regional  Level of Consciousness: awake, alert  and oriented  Airway & Oxygen Therapy: Patient Spontanous Breathing  Post-op Assessment: Report given to RN  Post vital signs: Reviewed and stable  Last Vitals:  Vitals Value Taken Time  BP    Temp    Pulse 97 02/13/19 0957  Resp 14 02/13/19 0957  SpO2 97 % 02/13/19 0957  Vitals shown include unvalidated device data.  Last Pain:  Vitals:   02/13/19 0855  TempSrc:   PainSc: 0-No pain      Patients Stated Pain Goal: 1 (37/48/27 0786)  Complications: No apparent anesthesia complications

## 2019-02-13 NOTE — Op Note (Signed)
02/13/2019  9:56 AM  PATIENT:  [name]    PRE-OPERATIVE DIAGNOSIS:  Osteomyelitis Right Great Toe  POST-OPERATIVE DIAGNOSIS:  Same  PROCEDURE:  RIGHT GREAT TOE AMPUTATION  SURGEON:  [name], MD  PHYSICIAN ASSISTANT:None ANESTHESIA:   General  PREOPERATIVE INDICATIONS:  [name] is a  68 y.o. male with a diagnosis of Osteomyelitis Right Great Toe who failed conservative measures and elected for surgical management.    The risks benefits and alternatives were discussed with the patient preoperatively including but not limited to the risks of infection, bleeding, nerve injury, cardiopulmonary complications, the need for revision surgery, among others, and the patient was willing to proceed.  OPERATIVE IMPLANTS: None  @ENCIMAGES @  OPERATIVE FINDINGS: No abscess at the MTP joint  OPERATIVE PROCEDURE: Patient was brought the operating room and underwent a regional anesthetic.  After adequate levels anesthesia were obtained patient's right lower extremity was prepped using DuraPrep draped in the sterile field a timeout was called.  A fishmouth incision was made just distal to the MTP joint.  The great toe was amputated through the MTP joint.  No signs of infection at the level of surgery.  The wound was irrigated with normal saline electrocautery was used for hemostasis the incision was closed using 2-0 nylon a sterile dressing was applied patient was taken the PACU in stable condition.   DISCHARGE PLANNING:  Antibiotic duration: Preoperative antibiotics  Weightbearing: Touchdown weightbearing on the right  Pain medication: Prescription for Percocet  Dressing care/ Wound VAC: Follow-up in 1 week to change the dressing  Ambulatory devices: Crutches walker or wheelchair  Discharge to: Home.  Follow-up: In the office 1 week post operative.

## 2019-02-13 NOTE — Anesthesia Procedure Notes (Signed)
Anesthesia Regional Block: Ankle block   Pre-Anesthetic Checklist: ,, timeout performed, Correct Patient, Correct Site, Correct Laterality, Correct Procedure, Correct Position, site marked, Risks and benefits discussed, pre-op evaluation,  At surgeon's request and post-op pain management  Laterality: Right  Prep: Maximum Sterile Barrier Precautions used, chloraprep       Needles:  Injection technique: Single-shot  Needle Type: Echogenic Needle     Needle Length: 4cm  Needle Gauge: 25     Additional Needles:   Narrative:  Start time: 02/13/2019 8:32 AM End time: 02/13/2019 8:35 AM  Performed by: Personally  Anesthesiologist: Brennan Bailey, MD  Additional Notes: Risks, benefits, and alternative discussed. Patient gave consent for procedure. Patient prepped and draped in sterile fashion. Sedation administered, patient remains easily responsive to voice. Local anesthetic given in 5cc increments with no signs or symptoms of intravascular injection. No pain or paraesthesias with injection. Patient monitored throughout procedure with signs of LAST or immediate complications. Tolerated well.   Tawny Asal, MD

## 2019-02-13 NOTE — H&P (Signed)
[name] is an 68 y.o. male.   Chief Complaint: Osteomyelitis Right Great Toe HPI: HPI: Patient is a 68 year old gentleman with ulceration osteomyelitis right great toe.  Previous uric acid was 6.9 hemoglobin A1c 8.6.   Past Medical History:  Diagnosis Date  . Anxiety   . Arthritis   . Colon polyps   . Depression   . Diabetes mellitus without complication ([place])   . Fatty liver   . GERD (gastroesophageal reflux disease)   . Gout   . High cholesterol   . History of echocardiogram 09/14/2011   EF >55%; borderline concentric LVH;   . History of stress test 06/21/2010   exercise; normal study  . Hypertension   . MET TEST 08/15/2011   moderate peak VO2 limitation at 66% predicted; mod cardiac impairment with low SV & low anaerobic threshold, mild chronotropic incompetence; low risk  . Nausea   . Osteomyelitis ([place])    right great toe  . Renal disorder   . Thrombocytopenia ([place])    PMH    Past Surgical History:  Procedure Laterality Date  . CHOLECYSTECTOMY    . COLONOSCOPY N/A 09/21/2012   Procedure: COLONOSCOPY;  Surgeon: Rogene Houston, MD;  [facility];  Service: Endoscopy;  Laterality: N/A;  730  . COLONOSCOPY N/A 01/30/2019   Procedure: COLONOSCOPY;  Surgeon: Rogene Houston, MD;  [facility];  Service: Endoscopy;  Laterality: N/A;  10:30  . HERNIA REPAIR     x 2  . HIP SURGERY     Rt hip age 68  . NASAL POLYP SURGERY      2-3 months ago  . POLYPECTOMY  01/30/2019   Procedure: POLYPECTOMY;  Surgeon: Rogene Houston, MD;  [facility];  Service: Endoscopy;;  proximal transverse colon, cecal    Family History  Problem Relation Age of Onset  . Colon cancer Brother    Social History:  reports that he has never smoked. He has never used smokeless tobacco. He reports that he does not drink alcohol or use drugs.  Allergies:  Allergies  Allergen Reactions  . Penicillins Other (See Comments)    Unknown Did it involve swelling of  the face/tongue/throat, SOB, or low BP? Unknown Did it involve sudden or severe rash/hives, skin peeling, or any reaction on the inside of your mouth or nose? Unknown Did you need to seek medical attention at a hospital or doctor's office? Unknown When did it last happen?childhood allergy If all above answers are "NO", may proceed with cephalosporin use.   Samuel Germany Dye [Iodinated Diagnostic Agents]     Sped heart reate up   . Tetanus Toxoids Rash    No medications prior to admission.    Results for orders placed or performed during the hospital encounter of 02/11/19 (from the past 48 hour(s))  SARS CORONAVIRUS 2 (TAT 6-24 HRS) Nasopharyngeal Nasopharyngeal Swab     Status: None   Collection Time: 02/11/19  7:30 AM   Specimen: Nasopharyngeal Swab  Result Value Ref Range   SARS Coronavirus 2 NEGATIVE NEGATIVE    Comment: (NOTE) SARS-CoV-2 target nucleic acids are NOT DETECTED. The SARS-CoV-2 RNA is generally detectable in upper and lower respiratory specimens during the acute phase of infection. Negative results do not preclude SARS-CoV-2 infection, do not rule out co-infections with other pathogens, and should not be used as the sole basis for treatment or other patient management decisions. Negative results must be combined with clinical observations, patient  history, and epidemiological information. The expected result is Negative. Fact Sheet for Patients: SugarRoll.be Fact Sheet for Healthcare Providers: https://www.woods-mathews.com/ This test is not yet approved or cleared by the [place] FDA and  has been authorized for detection and/or diagnosis of SARS-CoV-2 by FDA under an Emergency Use Authorization (EUA). This EUA will remain  in effect (meaning this test can be used) for the duration of the COVID-19 declaration under Section 56 4(b)(1) of the Act, 21 U.S.C. section 360bbb-3(b)(1), unless the authorization is terminated  or revoked sooner. Performed at [facility], [place] [address], [place], [place] [address]    No results found.  Review of Systems  All other systems reviewed and are negative.   There were no vitals taken for this visit. Physical Exam  Patient is alert, oriented, no adenopathy, well-dressed, normal affect, normal respiratory effort. Examination patient has a good dorsalis pedis pulse he has cellulitis sausage digit swelling and dermatitis of the right great toe.  MRI scan shows destructive osteoVisit Diagnoses:  1. Osteomyelitis of great toe of right foot ([place])      Assessment/Plan Visit Diagnoses:  1. Osteomyelitis of great toe of right foot ([place])     Plan: We will plan for amputation of the right great toe patient wants to wait until after Christmas plan for surgery on December 30 as an outpatient.   Bevely Palmer Nhu Glasby, PA 02/13/2019, 6:46 AM

## 2019-02-13 NOTE — Progress Notes (Signed)
Orthopedic Tech Progress Note Patient Details:  [name] Apr 13, 1950 [id number] PACU RN called requesting crutches and a post op shoe for patient Ortho Devices Type of Ortho Device: Crutches, Postop shoe/boot Ortho Device/Splint Location: RLE Ortho Device/Splint Interventions: Application, Ordered   Post Interventions Patient Tolerated: Ambulated well, Well Instructions Provided: Poper ambulation with device, Care of device, Adjustment of device   [name] 02/13/2019, 11:00 AM

## 2019-02-18 ENCOUNTER — Encounter: Payer: Self-pay | Admitting: *Deleted

## 2019-02-18 ENCOUNTER — Encounter: Payer: Self-pay | Admitting: Cardiology

## 2019-02-18 ENCOUNTER — Telehealth (INDEPENDENT_AMBULATORY_CARE_PROVIDER_SITE_OTHER): Payer: Medicare Other | Admitting: Cardiology

## 2019-02-18 VITALS — BP 147/72 | HR 90 | Ht 77.0 in | Wt 238.0 lb

## 2019-02-18 DIAGNOSIS — N1831 Chronic kidney disease, stage 3a: Secondary | ICD-10-CM

## 2019-02-18 DIAGNOSIS — I10 Essential (primary) hypertension: Secondary | ICD-10-CM

## 2019-02-18 DIAGNOSIS — R0789 Other chest pain: Secondary | ICD-10-CM

## 2019-02-18 DIAGNOSIS — E782 Mixed hyperlipidemia: Secondary | ICD-10-CM

## 2019-02-18 MED ORDER — CARVEDILOL 12.5 MG PO TABS: 12.5000 mg | ORAL_TABLET | Freq: Two times a day (BID) | ORAL | 1 refills | Status: DC

## 2019-02-18 NOTE — Progress Notes (Signed)
Virtual Visit via Video Note   This visit type was conducted due to national recommendations for restrictions regarding the COVID-19 Pandemic (e.g. social distancing) in an effort to limit this patient's exposure and mitigate transmission in our community.  Due to his co-morbid illnesses, this patient is at least at moderate risk for complications without adequate follow up.  This format is felt to be most appropriate for this patient at this time.  All issues noted in this document were discussed and addressed.  A limited physical exam was performed with this format.  Please refer to the patient's chart for his consent to telehealth for [facility].   Date:  02/18/2019   ID:  [name], DOB March 23, 1950, MRN [id number]  Patient Location: Home Provider Location: Office  PCP:  Redmond School, MD  Cardiologist:  Carlyle Dolly, MD  Electrophysiologist:  None   Evaluation Performed:  Follow-Up Visit  Chief Complaint:  Follow up  History of Present Illness:    [name] is a 69 y.o. male seen today for follow up of the followig medical problems.  1. Atypical chest pain -06/2010 nuclear stress: no ischemia 05/2017 [facility] Nuclear stress: no ischemia 05/2017 CTA chest: no acute aortic pathology  - no recent chest pain.   2. HTN -manual recheck bp today by family at home 176/82 - compliant with meds   3. CKD 3 - followed by Dr [name]    4. Mentalhandicap  5. DM2 -followed by pcp - defer ACEI consideration to nephrology.    6. Hyperlipidemia - on atorvastatin - labs followed by pcp  7. History of osteomyelitis - recent amputation of right great toe for osteo    The patient does not have symptoms concerning for COVID-19 infection (fever, chills, cough, or new shortness of breath).    Past Medical History:  Diagnosis Date  . Anxiety   . Arthritis   . Colon polyps   . Depression   . Diabetes mellitus without complication ([place])     . Fatty liver   . GERD (gastroesophageal reflux disease)   . Gout   . High cholesterol   . History of echocardiogram 09/14/2011   EF >55%; borderline concentric LVH;   . History of stress test 06/21/2010   exercise; normal study  . Hypertension   . MET TEST 08/15/2011   moderate peak VO2 limitation at 66% predicted; mod cardiac impairment with low SV & low anaerobic threshold, mild chronotropic incompetence; low risk  . Nausea   . Osteomyelitis ([place])    right great toe  . Renal disorder   . Thrombocytopenia ([place])    PMH   Past Surgical History:  Procedure Laterality Date  . AMPUTATION Right 02/13/2019   Procedure: RIGHT GREAT TOE AMPUTATION;  Surgeon: [name], MD;  Location: [place];  Service: Orthopedics;  Laterality: Right;  . CHOLECYSTECTOMY    . COLONOSCOPY N/A 09/21/2012   Procedure: COLONOSCOPY;  Surgeon: Rogene Houston, MD;  [facility];  Service: Endoscopy;  Laterality: N/A;  730  . COLONOSCOPY N/A 01/30/2019   Procedure: COLONOSCOPY;  Surgeon: Rogene Houston, MD;  [facility];  Service: Endoscopy;  Laterality: N/A;  10:30  . HERNIA REPAIR     x 2  . HIP SURGERY     Rt hip age 71  . NASAL POLYP SURGERY      2-3 months ago  . POLYPECTOMY  01/30/2019   Procedure: POLYPECTOMY;  Surgeon: Hildred Laser  U, MD;  [facility];  Service: Endoscopy;;  proximal transverse colon, cecal     No outpatient medications have been marked as taking for the 02/18/19 encounter (Appointment) with Arnoldo Lenis, MD.     Allergies:   Penicillins, Ivp dye [iodinated diagnostic agents], and Tetanus toxoids   Social History   Tobacco Use  . Smoking status: Never Smoker  . Smokeless tobacco: Never Used  Substance Use Topics  . Alcohol use: No  . Drug use: No     Family Hx: The patient's family history includes Colon cancer in his brother.  ROS:   Please see the history of present illness.     All other systems reviewed and are  negative.   Prior CV studies:   The following studies were reviewed today:  Labs/Other Tests and Data Reviewed:    EKG:  No ECG reviewed.  Recent Labs: 02/13/2019: ALT 19; BUN 22; Creatinine, Ser 1.39; Hemoglobin 14.7; Platelets 197; Potassium 4.0; Sodium 135   Recent Lipid Panel No results found for: CHOL, TRIG, HDL, CHOLHDL, LDLCALC, LDLDIRECT  Wt Readings from Last 3 Encounters:  02/13/19 240 lb (108.9 kg)  01/31/19 250 lb (113.4 kg)  01/30/19 250 lb (113.4 kg)     Objective:    Vital Signs:   Today's Vitals   02/18/19 1055  BP: (!) 147/72  Pulse: 90  Weight: 238 lb (108 kg)  Height: 6' 5"  (1.956 m)   Body mass index is 28.22 kg/m.  Well nourished male sitting comfortable in no apparent distress. No visual or audible signs of SOB or wheezing. Normal affect, normal speech pattern and tone.   ASSESSMENT & PLAN:     1. Chest pain - long history of symptoms with negative stress test in 2012 and 2019 -no recent symptoms, continue to monitor.  - mix up on imdur at pharmacy, has not been taking. Since no current pain will not restart  2. HTN - above goal. Limited on options given some renal dysfunction - change lopressor to coreg 12.62m bid for bp effects, bp log x 1 week.   3. CKD III - limit nephrotoxic drugs - defer ACEI considrations to nephrology given DM2 and CKD  4. Hyperlipidemia - in setting of his DM2 would consider at least moderate dose statin (atorvastatin 451mdaily), defer to pcp - request labs from pcp  F/u 6 months.    COVID-19 Education: The signs and symptoms of COVID-19 were discussed with the patient and how to seek care for testing (follow up with PCP or arrange E-visit).  The importance of social distancing was discussed today.  Time:   Today, I have spent 20 minutes with the patient with telehealth technology discussing the above problems.     Medication Adjustments/Labs and Tests Ordered: Current medicines are reviewed at  length with the patient today.  Concerns regarding medicines are outlined above.   Tests Ordered: No orders of the defined types were placed in this encounter.   Medication Changes: No orders of the defined types were placed in this encounter.   Follow Up:  Either In Person or Virtual in 6 month(s)  Signed, BrCarlyle DollyMD  02/18/2019 10:39 AM    [place]

## 2019-02-18 NOTE — Patient Instructions (Signed)
Your physician wants you to follow-up in: [place] will receive a reminder letter in the mail two months in advance. If you don't receive a letter, please call our office to schedule the follow-up appointment.  Your physician has recommended you make the following change in your medication:   STOP LOPRESSOR   START CARVEDILOL 12.5 MG TWICE DAILY   Thank you for choosing [facility]!!

## 2019-02-18 NOTE — Addendum Note (Signed)
Addended by: Julian Hy T on: 02/18/2019 11:48 AM   Modules accepted: Orders

## 2019-02-20 ENCOUNTER — Ambulatory Visit (INDEPENDENT_AMBULATORY_CARE_PROVIDER_SITE_OTHER): Payer: Medicare Other | Admitting: Family

## 2019-02-20 ENCOUNTER — Other Ambulatory Visit: Payer: Self-pay

## 2019-02-20 ENCOUNTER — Encounter: Payer: Self-pay | Admitting: Family

## 2019-02-20 VITALS — Ht 77.0 in | Wt 238.0 lb

## 2019-02-20 DIAGNOSIS — M869 Osteomyelitis, unspecified: Secondary | ICD-10-CM

## 2019-02-20 DIAGNOSIS — Z89411 Acquired absence of right great toe: Secondary | ICD-10-CM

## 2019-02-20 DIAGNOSIS — S98111A Complete traumatic amputation of right great toe, initial encounter: Secondary | ICD-10-CM

## 2019-02-20 NOTE — Progress Notes (Signed)
Post-Op Visit Note   Patient: [name]           Date of Birth: 1951/01/20           MRN: [id number] Visit Date: 02/20/2019 PCP: Redmond School, MD  Chief Complaint:  Chief Complaint  Patient presents with  . Right Foot - Routine Post Op    02/13/19 right GT amputation     HPI:  HPI The patient is a 69 year old gentleman who presents today status post right great toe amputation 1 week ago family accompanies the visit he has been trying to limit his weightbearing but has been full weightbearing in a postop shoe walking around the yard and hard for him to be still. Ortho Exam On examination of the incision there is some surrounding maceration however it is well approximated with sutures there is no active drainage no gaping no sign of infection  Visit Diagnoses:  1. Osteomyelitis of great toe of right foot ([place])   2. Amputated great toe of right foot ([place])     Plan: Begin daily Dial soap cleansing.  Dry dressing changes.  Attempt to limit weightbearing will work on elevation follow-up in office in 2 weeks for suture removal.  He may stop his doxycycline at this time.  Follow-Up Instructions: Return in about 2 weeks (around 03/06/2019).   Imaging: No results found.  Orders:  No orders of the defined types were placed in this encounter.  No orders of the defined types were placed in this encounter.    PMFS History: Patient Active Problem List   Diagnosis Date Noted  . Amputated great toe of right foot ([place]) 02/20/2019  . Osteomyelitis of great toe of right foot ([place])   . DM (diabetes mellitus) ([place]) 01/30/2019  . Fatty liver 01/09/2019  . Thrombocytopenia, unspecified ([place]) 05/14/2013  . Colon adenoma 09/13/2012  . Family hx of colon cancer 09/13/2012  . Anxiety 09/06/2012  . Mental retardation 09/06/2012  . Chest pain 09/06/2012  . GERD (gastroesophageal reflux disease) 03/08/2012  . Nausea 09/06/2011  . Gout 09/06/2011  . Diarrhea 09/06/2011  .  Hypertension 03/08/2011   Past Medical History:  Diagnosis Date  . Anxiety   . Arthritis   . Colon polyps   . Depression   . Diabetes mellitus without complication ([place])   . Fatty liver   . GERD (gastroesophageal reflux disease)   . Gout   . High cholesterol   . History of echocardiogram 09/14/2011   EF >55%; borderline concentric LVH;   . History of stress test 06/21/2010   exercise; normal study  . Hypertension   . MET TEST 08/15/2011   moderate peak VO2 limitation at 66% predicted; mod cardiac impairment with low SV & low anaerobic threshold, mild chronotropic incompetence; low risk  . Nausea   . Osteomyelitis ([place])    right great toe  . Renal disorder   . Thrombocytopenia ([place])    PMH    Family History  Problem Relation Age of Onset  . Colon cancer Brother     Past Surgical History:  Procedure Laterality Date  . AMPUTATION Right 02/13/2019   Procedure: RIGHT GREAT TOE AMPUTATION;  Surgeon: [name], MD;  Location: [place];  Service: Orthopedics;  Laterality: Right;  . CHOLECYSTECTOMY    . COLONOSCOPY N/A 09/21/2012   Procedure: COLONOSCOPY;  Surgeon: Rogene Houston, MD;  [facility];  Service: Endoscopy;  Laterality: N/A;  730  . COLONOSCOPY N/A 01/30/2019  Procedure: COLONOSCOPY;  Surgeon: Rogene Houston, MD;  [facility];  Service: Endoscopy;  Laterality: N/A;  10:30  . HERNIA REPAIR     x 2  . HIP SURGERY     Rt hip age 70  . NASAL POLYP SURGERY      2-3 months ago  . POLYPECTOMY  01/30/2019   Procedure: POLYPECTOMY;  Surgeon: Rogene Houston, MD;  [facility];  Service: Endoscopy;;  proximal transverse colon, cecal   Social History   Occupational History  . Not on file  Tobacco Use  . Smoking status: Never Smoker  . Smokeless tobacco: Never Used  Substance and Sexual Activity  . Alcohol use: No  . Drug use: No  . Sexual activity: Not on file

## 2019-02-26 ENCOUNTER — Other Ambulatory Visit: Payer: Self-pay | Admitting: Cardiology

## 2019-03-06 ENCOUNTER — Ambulatory Visit (INDEPENDENT_AMBULATORY_CARE_PROVIDER_SITE_OTHER): Payer: Medicare Other | Admitting: Family

## 2019-03-06 ENCOUNTER — Other Ambulatory Visit (INDEPENDENT_AMBULATORY_CARE_PROVIDER_SITE_OTHER): Payer: Self-pay | Admitting: Internal Medicine

## 2019-03-06 ENCOUNTER — Encounter: Payer: Self-pay | Admitting: Family

## 2019-03-06 ENCOUNTER — Other Ambulatory Visit: Payer: Self-pay

## 2019-03-06 VITALS — Ht 77.0 in | Wt 238.0 lb

## 2019-03-06 DIAGNOSIS — S98111A Complete traumatic amputation of right great toe, initial encounter: Secondary | ICD-10-CM

## 2019-03-06 DIAGNOSIS — Z89411 Acquired absence of right great toe: Secondary | ICD-10-CM

## 2019-03-06 NOTE — Progress Notes (Signed)
Post-Op Visit Note   Patient: [name]           Date of Birth: [birth year]           MRN: [id number] Visit Date: 03/06/2019 PCP: Redmond School, MD  Chief Complaint:  Chief Complaint  Patient presents with  . Right Foot - Routine Post Op    02/13/19 right GT amputation     HPI:  HPI The patient is a 69 year old gentleman seen today status post right great toe amputation December 30.  He has been doing a dry dressing is full weightbearing in regular shoewear he does complain of some throbbing pain occasionally having phantom pains at night.  He does have a Percocet prescription that he has not been using for quite some time Ortho Exam On examination the incision is well-healed sutures do remain in place these were harvested today without incident there is scant bloody drainage no erythema no tenderness no warmth  Visit Diagnoses:  1. Amputated great toe of right foot ([place])     Plan: Advance weightbearing as tolerated in regular shoewear he will follow-up in the office as needed  Follow-Up Instructions: Return if symptoms worsen or fail to improve.   Imaging: No results found.  Orders:  No orders of the defined types were placed in this encounter.  No orders of the defined types were placed in this encounter.    PMFS History: Patient Active Problem List   Diagnosis Date Noted  . Amputated great toe of right foot ([place]) 02/20/2019  . Stage 3a chronic kidney disease 02/13/2019  . Osteomyelitis of great toe of right foot ([place])   . DM (diabetes mellitus) ([place]) 01/30/2019  . Fatty liver 01/09/2019  . Thrombocytopenia, unspecified ([place]) 05/14/2013  . Other specified abnormal findings of blood chemistry 05/14/2013  . Colon adenoma 09/13/2012  . Family hx of colon cancer 09/13/2012  . Family history of malignant neoplasm of digestive organs 09/13/2012  . Anxiety 09/06/2012  . Mental retardation 09/06/2012  . Chest pain 09/06/2012  . GERD (gastroesophageal reflux  disease) 03/08/2012  . Nausea 09/06/2011  . Gout 09/06/2011  . Diarrhea 09/06/2011  . Hypertension 03/08/2011  . Chronic kidney disease 11/30/2009  . Urinary tract infection 11/30/2009   Past Medical History:  Diagnosis Date  . Anxiety   . Arthritis   . Colon polyps   . Depression   . Diabetes mellitus without complication ([place])   . Fatty liver   . GERD (gastroesophageal reflux disease)   . Gout   . High cholesterol   . History of echocardiogram 09/14/2011   EF >55%; borderline concentric LVH;   . History of stress test 06/21/2010   exercise; normal study  . Hypertension   . MET TEST 08/15/2011   moderate peak VO2 limitation at 66% predicted; mod cardiac impairment with low SV & low anaerobic threshold, mild chronotropic incompetence; low risk  . Nausea   . Osteomyelitis ([place])    right great toe  . Renal disorder   . Thrombocytopenia ([place])    PMH    Family History  Problem Relation Age of Onset  . Colon cancer Brother     Past Surgical History:  Procedure Laterality Date  . AMPUTATION Right 02/13/2019   Procedure: RIGHT GREAT TOE AMPUTATION;  Surgeon: [name], MD;  Location: [place];  Service: Orthopedics;  Laterality: Right;  . CHOLECYSTECTOMY    . COLONOSCOPY N/A 09/21/2012   Procedure: COLONOSCOPY;  Surgeon: Rogene Houston, MD;  [facility];  Service: Endoscopy;  Laterality: N/A;  730  . COLONOSCOPY N/A 01/30/2019   Procedure: COLONOSCOPY;  Surgeon: Rogene Houston, MD;  [facility];  Service: Endoscopy;  Laterality: N/A;  10:30  . HERNIA REPAIR     x 2  . HIP SURGERY     Rt hip age 35  . NASAL POLYP SURGERY      2-3 months ago  . POLYPECTOMY  01/30/2019   Procedure: POLYPECTOMY;  Surgeon: Rogene Houston, MD;  [facility];  Service: Endoscopy;;  proximal transverse colon, cecal   Social History   Occupational History  . Not on file  Tobacco Use  . Smoking status: Never Smoker  . Smokeless tobacco: Never Used   Substance and Sexual Activity  . Alcohol use: No  . Drug use: No  . Sexual activity: Not on file

## 2019-03-17 DIAGNOSIS — E7849 Other hyperlipidemia: Secondary | ICD-10-CM | POA: Diagnosis not present

## 2019-03-17 DIAGNOSIS — M1831 Unilateral post-traumatic osteoarthritis of first carpometacarpal joint, right hand: Secondary | ICD-10-CM | POA: Diagnosis not present

## 2019-03-17 DIAGNOSIS — E1122 Type 2 diabetes mellitus with diabetic chronic kidney disease: Secondary | ICD-10-CM | POA: Diagnosis not present

## 2019-03-17 DIAGNOSIS — I129 Hypertensive chronic kidney disease with stage 1 through stage 4 chronic kidney disease, or unspecified chronic kidney disease: Secondary | ICD-10-CM | POA: Diagnosis not present

## 2019-03-20 ENCOUNTER — Other Ambulatory Visit: Payer: Self-pay | Admitting: Nephrology

## 2019-03-20 DIAGNOSIS — E559 Vitamin D deficiency, unspecified: Secondary | ICD-10-CM | POA: Diagnosis not present

## 2019-03-20 DIAGNOSIS — I129 Hypertensive chronic kidney disease with stage 1 through stage 4 chronic kidney disease, or unspecified chronic kidney disease: Secondary | ICD-10-CM | POA: Diagnosis not present

## 2019-03-20 DIAGNOSIS — N1831 Chronic kidney disease, stage 3a: Secondary | ICD-10-CM

## 2019-03-20 DIAGNOSIS — N281 Cyst of kidney, acquired: Secondary | ICD-10-CM | POA: Diagnosis not present

## 2019-03-20 DIAGNOSIS — R809 Proteinuria, unspecified: Secondary | ICD-10-CM | POA: Diagnosis not present

## 2019-04-11 ENCOUNTER — Ambulatory Visit (HOSPITAL_COMMUNITY)
Admission: RE | Admit: 2019-04-11 | Discharge: 2019-04-11 | Disposition: A | Discharge status: Home / Self Care | Payer: Medicare Other | Source: Ambulatory Visit | Attending: Nephrology | Admitting: Nephrology

## 2019-04-11 ENCOUNTER — Other Ambulatory Visit: Payer: Self-pay

## 2019-04-11 DIAGNOSIS — N1831 Chronic kidney disease, stage 3a: Secondary | ICD-10-CM | POA: Insufficient documentation

## 2019-04-11 DIAGNOSIS — R809 Proteinuria, unspecified: Secondary | ICD-10-CM | POA: Diagnosis not present

## 2019-04-11 DIAGNOSIS — E1122 Type 2 diabetes mellitus with diabetic chronic kidney disease: Secondary | ICD-10-CM | POA: Diagnosis not present

## 2019-04-11 DIAGNOSIS — E559 Vitamin D deficiency, unspecified: Secondary | ICD-10-CM | POA: Diagnosis not present

## 2019-04-11 DIAGNOSIS — I129 Hypertensive chronic kidney disease with stage 1 through stage 4 chronic kidney disease, or unspecified chronic kidney disease: Secondary | ICD-10-CM | POA: Diagnosis not present

## 2019-04-11 DIAGNOSIS — Z0001 Encounter for general adult medical examination with abnormal findings: Secondary | ICD-10-CM | POA: Diagnosis not present

## 2019-04-11 DIAGNOSIS — Z0000 Encounter for general adult medical examination without abnormal findings: Secondary | ICD-10-CM | POA: Diagnosis not present

## 2019-04-11 DIAGNOSIS — N281 Cyst of kidney, acquired: Secondary | ICD-10-CM | POA: Diagnosis not present

## 2019-04-11 DIAGNOSIS — E1165 Type 2 diabetes mellitus with hyperglycemia: Secondary | ICD-10-CM | POA: Diagnosis not present

## 2019-04-11 DIAGNOSIS — E7849 Other hyperlipidemia: Secondary | ICD-10-CM | POA: Diagnosis not present

## 2019-04-11 DIAGNOSIS — Z1389 Encounter for screening for other disorder: Secondary | ICD-10-CM | POA: Diagnosis not present

## 2019-04-11 DIAGNOSIS — N189 Chronic kidney disease, unspecified: Secondary | ICD-10-CM | POA: Diagnosis not present

## 2019-04-14 DIAGNOSIS — N1831 Chronic kidney disease, stage 3a: Secondary | ICD-10-CM | POA: Diagnosis not present

## 2019-04-14 DIAGNOSIS — E1165 Type 2 diabetes mellitus with hyperglycemia: Secondary | ICD-10-CM | POA: Diagnosis not present

## 2019-04-14 DIAGNOSIS — E1122 Type 2 diabetes mellitus with diabetic chronic kidney disease: Secondary | ICD-10-CM | POA: Diagnosis not present

## 2019-04-14 DIAGNOSIS — I129 Hypertensive chronic kidney disease with stage 1 through stage 4 chronic kidney disease, or unspecified chronic kidney disease: Secondary | ICD-10-CM | POA: Diagnosis not present

## 2019-05-09 ENCOUNTER — Other Ambulatory Visit: Payer: Self-pay

## 2019-05-09 NOTE — Patient Outreach (Signed)
[facility]) Care Management  05/09/2019  [name] 11/14/1950 [id number]   Medication Adherence call to [name] HIPPA Compliant Voice message left with a call back number. [name] is showing past due on Atorvastatin 10 mg,Glipizide 5 mg,Pioglitazone 30 mg and Enalapril 10 mg under [place].   [place] Management Direct Dial [phone]  Fax [phone] [email or web address]

## 2019-05-17 DIAGNOSIS — I129 Hypertensive chronic kidney disease with stage 1 through stage 4 chronic kidney disease, or unspecified chronic kidney disease: Secondary | ICD-10-CM | POA: Diagnosis not present

## 2019-05-17 DIAGNOSIS — D631 Anemia in chronic kidney disease: Secondary | ICD-10-CM | POA: Diagnosis not present

## 2019-05-17 DIAGNOSIS — N1831 Chronic kidney disease, stage 3a: Secondary | ICD-10-CM | POA: Diagnosis not present

## 2019-05-17 DIAGNOSIS — R809 Proteinuria, unspecified: Secondary | ICD-10-CM | POA: Diagnosis not present

## 2019-05-17 DIAGNOSIS — E559 Vitamin D deficiency, unspecified: Secondary | ICD-10-CM | POA: Diagnosis not present

## 2019-05-24 DIAGNOSIS — E559 Vitamin D deficiency, unspecified: Secondary | ICD-10-CM | POA: Diagnosis not present

## 2019-05-24 DIAGNOSIS — N281 Cyst of kidney, acquired: Secondary | ICD-10-CM | POA: Diagnosis not present

## 2019-05-24 DIAGNOSIS — I129 Hypertensive chronic kidney disease with stage 1 through stage 4 chronic kidney disease, or unspecified chronic kidney disease: Secondary | ICD-10-CM | POA: Diagnosis not present

## 2019-05-24 DIAGNOSIS — N1831 Chronic kidney disease, stage 3a: Secondary | ICD-10-CM | POA: Diagnosis not present

## 2019-05-24 DIAGNOSIS — R809 Proteinuria, unspecified: Secondary | ICD-10-CM | POA: Diagnosis not present

## 2019-05-29 ENCOUNTER — Other Ambulatory Visit: Payer: Self-pay | Admitting: Nephrology

## 2019-05-29 DIAGNOSIS — N281 Cyst of kidney, acquired: Secondary | ICD-10-CM

## 2019-05-29 DIAGNOSIS — I10 Essential (primary) hypertension: Secondary | ICD-10-CM

## 2019-05-29 DIAGNOSIS — R809 Proteinuria, unspecified: Secondary | ICD-10-CM

## 2019-05-29 DIAGNOSIS — N1832 Chronic kidney disease, stage 3b: Secondary | ICD-10-CM

## 2019-06-07 ENCOUNTER — Encounter (HOSPITAL_COMMUNITY)
Admission: RE | Admit: 2019-06-07 | Discharge: 2019-06-07 | Disposition: A | Discharge status: Home / Self Care | Payer: Medicare Other | Source: Ambulatory Visit | Attending: Nephrology | Admitting: Nephrology

## 2019-06-07 ENCOUNTER — Other Ambulatory Visit: Payer: Self-pay

## 2019-06-07 ENCOUNTER — Encounter (HOSPITAL_COMMUNITY): Payer: Self-pay

## 2019-06-07 DIAGNOSIS — N1832 Chronic kidney disease, stage 3b: Secondary | ICD-10-CM | POA: Insufficient documentation

## 2019-06-07 DIAGNOSIS — I10 Essential (primary) hypertension: Secondary | ICD-10-CM | POA: Diagnosis not present

## 2019-06-07 DIAGNOSIS — N189 Chronic kidney disease, unspecified: Secondary | ICD-10-CM | POA: Diagnosis not present

## 2019-06-07 DIAGNOSIS — R809 Proteinuria, unspecified: Secondary | ICD-10-CM | POA: Insufficient documentation

## 2019-06-07 DIAGNOSIS — N281 Cyst of kidney, acquired: Secondary | ICD-10-CM | POA: Insufficient documentation

## 2019-06-07 HISTORY — DX: Disorder of kidney and ureter, unspecified: N28.9

## 2019-06-07 MED ORDER — FUROSEMIDE 10 MG/ML IJ SOLN
INTRAMUSCULAR | Status: AC
  Administered 2019-06-07: 08:00:00 49 mg via INTRAVENOUS
  Filled 2019-06-07: qty 6

## 2019-06-07 MED ORDER — TECHNETIUM TC 99M MERTIATIDE
5.0000 | Freq: Once | INTRAVENOUS | Status: AC | PRN
  Administered 2019-06-07: 08:00:00 5.2 via INTRAVENOUS

## 2019-06-07 MED ORDER — FUROSEMIDE 10 MG/ML IJ SOLN
INTRAMUSCULAR | Status: AC
  Filled 2019-06-07: qty 2

## 2019-07-05 ENCOUNTER — Other Ambulatory Visit: Payer: Self-pay | Admitting: Cardiology

## 2019-07-11 DIAGNOSIS — I10 Essential (primary) hypertension: Secondary | ICD-10-CM | POA: Diagnosis not present

## 2019-07-11 DIAGNOSIS — E1165 Type 2 diabetes mellitus with hyperglycemia: Secondary | ICD-10-CM | POA: Diagnosis not present

## 2019-07-11 DIAGNOSIS — R209 Unspecified disturbances of skin sensation: Secondary | ICD-10-CM | POA: Diagnosis not present

## 2019-07-23 DIAGNOSIS — N281 Cyst of kidney, acquired: Secondary | ICD-10-CM | POA: Diagnosis not present

## 2019-07-23 DIAGNOSIS — R809 Proteinuria, unspecified: Secondary | ICD-10-CM | POA: Diagnosis not present

## 2019-07-23 DIAGNOSIS — E559 Vitamin D deficiency, unspecified: Secondary | ICD-10-CM | POA: Diagnosis not present

## 2019-07-23 DIAGNOSIS — I129 Hypertensive chronic kidney disease with stage 1 through stage 4 chronic kidney disease, or unspecified chronic kidney disease: Secondary | ICD-10-CM | POA: Diagnosis not present

## 2019-07-23 DIAGNOSIS — N1831 Chronic kidney disease, stage 3a: Secondary | ICD-10-CM | POA: Diagnosis not present

## 2019-07-26 DIAGNOSIS — N1339 Other hydronephrosis: Secondary | ICD-10-CM | POA: Diagnosis not present

## 2019-07-26 DIAGNOSIS — E559 Vitamin D deficiency, unspecified: Secondary | ICD-10-CM | POA: Diagnosis not present

## 2019-07-26 DIAGNOSIS — I129 Hypertensive chronic kidney disease with stage 1 through stage 4 chronic kidney disease, or unspecified chronic kidney disease: Secondary | ICD-10-CM | POA: Diagnosis not present

## 2019-07-26 DIAGNOSIS — N189 Chronic kidney disease, unspecified: Secondary | ICD-10-CM | POA: Diagnosis not present

## 2019-07-26 DIAGNOSIS — R809 Proteinuria, unspecified: Secondary | ICD-10-CM | POA: Diagnosis not present

## 2019-08-10 DIAGNOSIS — R52 Pain, unspecified: Secondary | ICD-10-CM | POA: Diagnosis not present

## 2019-08-10 DIAGNOSIS — M79603 Pain in arm, unspecified: Secondary | ICD-10-CM | POA: Diagnosis not present

## 2019-08-10 DIAGNOSIS — R5381 Other malaise: Secondary | ICD-10-CM | POA: Diagnosis not present

## 2019-08-10 DIAGNOSIS — I10 Essential (primary) hypertension: Secondary | ICD-10-CM | POA: Diagnosis not present

## 2019-09-20 ENCOUNTER — Encounter: Payer: Self-pay | Admitting: Family Medicine

## 2019-09-20 ENCOUNTER — Ambulatory Visit (INDEPENDENT_AMBULATORY_CARE_PROVIDER_SITE_OTHER): Payer: Medicare Other | Admitting: Family Medicine

## 2019-09-20 VITALS — BP 138/82 | HR 90 | Ht 77.0 in | Wt 235.0 lb

## 2019-09-20 DIAGNOSIS — R0789 Other chest pain: Secondary | ICD-10-CM | POA: Diagnosis not present

## 2019-09-20 DIAGNOSIS — N183 Chronic kidney disease, stage 3 unspecified: Secondary | ICD-10-CM

## 2019-09-20 DIAGNOSIS — E782 Mixed hyperlipidemia: Secondary | ICD-10-CM

## 2019-09-20 DIAGNOSIS — I10 Essential (primary) hypertension: Secondary | ICD-10-CM

## 2019-09-20 NOTE — Progress Notes (Signed)
Cardiology Office Note  Date: 09/20/2019   ID: [name], DOB [birth year], MRN [id number]  PCP:  Redmond School, MD  Cardiologist:  Carlyle Dolly, MD Electrophysiologist:  None   Chief Complaint: Follow-up other chest pain  History of Present Illness: [name] is a 69 y.o. male with a history of atypical chest pain, hypertension, CKD 3, DM 2, mental handicap, history of osteomyelitis.  Previous nuclear stress 2012 no ischemia, 05/2017 nuclear stress test [facility] no ischemia, 05/2017 CTA of chest: No acute aortic pathology.  Last encounter with Dr. Harl Bowie via telemedicine visit 02/18/2019.  Patient was having no recent chest pain.  Blood pressure was elevated at home with blood pressure 176/82.  He was compliant with his medications.  He was being followed by Dr. [name] for CKD 3.  On atorvastatin for hyperlipidemia and followed by PCP.  Recent amputation of right great toe for osteomyelitis.  His Lopressor was stopped.  Carvedilol was started at 12.5 mg p.o. twice daily to help with management of blood pressure.  Limit nephrotoxic drugs including ACE inhibitors.  Was deferring to nephrology giving DM2 with CKD.  Dr. Harl Bowie stated in consideration of DM2 would consider least moderate dose statin possibly atorvastatin 40 mg daily but will defer to PCP.  Labs were requested from PCP office.   Patient is here for 72-monthfollow-up.  He is here with his caregiver.  He has some cognitive disabilities.  He is hard to understand when communicating.  Caregiver states he has been doing reasonably well.  Having some right arm pain and occasional chest pain but no classic anginal or exertional symptoms.  She states the arm pain can happen at rest.  He denies any orthostatic symptoms, palpitations or arrhythmias, CVA or TIA-like symptoms, bleeding issues, PND, orthopnea, claudication-like symptoms, DVT or PE-like symptoms, or lower extremity edema.  Caregiver states he is having a hard  time with his diabetes and blood sugars being up.  She states he may be exercising some dietary indiscretions when she is not available.  She states he sees Dr. BTheador Hawthornefor his kidney disease.  .Marland Kitchen Past Medical History:  Diagnosis Date  . Anxiety   . Arthritis   . Colon polyps   . Depression   . Diabetes mellitus without complication ([place]   . Fatty liver   . GERD (gastroesophageal reflux disease)   . Gout   . High cholesterol   . History of echocardiogram 09/14/2011   EF >55%; borderline concentric LVH;   . History of stress test 06/21/2010   exercise; normal study  . Hypertension   . MET TEST 08/15/2011   moderate peak VO2 limitation at 66% predicted; mod cardiac impairment with low SV & low anaerobic threshold, mild chronotropic incompetence; low risk  . Nausea   . Osteomyelitis ([place]    right great toe  . Renal disorder   . Renal insufficiency   . [place]rombocytopenia ([place]    PMH    Past Surgical History:  Procedure Laterality Date  . AMPUTATION Right 02/13/2019   Procedure: RIGHT GREAT TOE AMPUTATION;  Surgeon: DNewt Minion MD;  [place]  Service: Orthopedics;  Laterality: Right;  . CHOLECYSTECTOMY    . COLONOSCOPY N/A 09/21/2012   Procedure: COLONOSCOPY;  Surgeon: NRogene Houston MD;  [facility];  Service: Endoscopy;  Laterality: N/A;  730  . COLONOSCOPY N/A 01/30/2019   Procedure: COLONOSCOPY;  Surgeon: RRogene Houston MD;  [facility];  Service: Endoscopy;  Laterality: N/A;  10:30  . HERNIA REPAIR     x 2  . HIP SURGERY     Rt hip age 39  . NASAL POLYP SURGERY      2-3 months ago  . POLYPECTOMY  01/30/2019   Procedure: POLYPECTOMY;  Surgeon: Rogene Houston, MD;  [facility];  Service: Endoscopy;;  proximal transverse colon, cecal    Current Outpatient Medications  Medication Sig Dispense Refill  . acetaminophen (TYLENOL) 500 MG tablet Take 1,000 mg by mouth every 6 (six) hours as needed. Pain    . amLODipine  (NORVASC) 10 MG tablet Take 10 mg by mouth daily.  3  . aspirin 81 MG tablet Take 1 tablet (81 mg total) by mouth daily. 30 tablet   . atorvastatin (LIPITOR) 10 MG tablet Take 10 mg by mouth at bedtime.  3  . B Complex-C (B-COMPLEX WITH VITAMIN C) tablet Take 1 tablet by mouth daily.    Marland Kitchen buPROPion (WELLBUTRIN XL) 150 MG 24 hr tablet Take 150 mg by mouth at bedtime.     . carvedilol (COREG) 12.5 MG tablet TAKE ONE TABLET BY MOUTH TWICE DAILY. 180 tablet 1  . cetirizine (ZYRTEC) 10 MG tablet Take 10 mg by mouth daily.    . Cholecalciferol (VITAMIN D) 50 MCG (2000 UT) CAPS Take 2,000 Units by mouth daily.     . ferrous sulfate 325 (65 FE) MG EC tablet Take 325 mg by mouth daily with breakfast.    . glipiZIDE (GLUCOTROL) 5 MG tablet Take 5 mg by mouth daily.  0  . loperamide (IMODIUM) 2 MG capsule TAKE ONE CAPSULE BY MOUTH FOUR TIMES DAILY AS NEEDED FOR DIARRHEA OR LOOSE STOOLS 120 capsule 3  . Omega-3 Fatty Acids (FISH OIL) 1000 MG CAPS Take 1,000 mg by mouth 2 (two) times daily. Afternoon & bedtime.    Marland Kitchen omeprazole (PRILOSEC) 40 MG capsule TAKE ONE CAPSULE BY MOUTH ONCE DAILY. (Patient taking differently: Take 40 mg by mouth daily. ) 90 capsule 3  . pioglitazone (ACTOS) 30 MG tablet Take 30 mg by mouth daily.  3  . sertraline (ZOLOFT) 100 MG tablet Take 100 mg by mouth at bedtime.   11  . sodium bicarbonate 650 MG tablet Take 650 mg by mouth 2 (two) times daily.  3  . TOUJEO MAX SOLOSTAR 300 UNIT/ML SOPN Inject 10 Units into the skin daily.     Marland Kitchen ULORIC 80 MG TABS Take 80 mg by mouth daily.   99   No current facility-administered medications for this visit.   Allergies:  Penicillins, Ivp dye [iodinated diagnostic agents], and Tetanus toxoids   Social History: The patient  reports that he has never smoked. He has never used smokeless tobacco. He reports that he does not drink alcohol and does not use drugs.   Family History: The patient's family history includes Colon cancer in his brother.    ROS:  Please see the history of present illness. Otherwise, complete review of systems is positive for none.  All other systems are reviewed and negative.   Physical Exam: VS:  BP 138/82   Pulse 90   Ht _0  (1.956 m)   Wt 235 lb (106.6 kg)   SpO2 98%   BMI 27.87 kg/m , BMI Body mass index is 27.87 kg/m.  Wt Readings from Last 3 Encounters:  09/20/19 235 lb (106.6 kg)  03/06/19 238 lb (108 kg)  02/20/19 238 lb (108  kg)    General: Patient appears comfortable at rest. Neck: Supple, no elevated JVP or carotid bruits, no thyromegaly. Lungs: Clear to auscultation, nonlabored breathing at rest. Cardiac: Regular rate and rhythm, no S3 or significant systolic murmur, no pericardial rub. Extremities: No pitting edema, distal pulses 2+. Skin: Warm and dry. Musculoskeletal: No kyphosis. Neuropsychiatric: Alert and oriented x3, affect grossly appropriate.  ECG:  An ECG dated 09/20/2019 was personally reviewed today and demonstrated:  Normal sinus rhythm rate of 91.  Recent Labwork: 02/13/2019: ALT 19; AST 17; BUN 22; Creatinine, Ser 1.39; Hemoglobin 14.7; Platelets 197; Potassium 4.0; Sodium 135  No results found for: CHOL, TRIG, HDL, CHOLHDL, VLDL, LDLCALC, LDLDIRECT  Other Studies Reviewed Today:   Assessment and Plan:  1. Other chest pain   2. Essential hypertension   3. Stage 3 chronic kidney disease, unspecified whether stage 3a or 3b CKD   4. Mixed hyperlipidemia    1. Other chest pain Patient has some occasional episodes of right arm pain and chest pain which can occur with or without exertion.  Denies any nausea, vomiting, diaphoresis, dizziness.  Continue aspirin 81 mg.  2. Essential hypertension Blood pressure slightly elevated today with BP 138/82.  Continue amlodipine 10 mg,  3. Stage 3 chronic kidney disease, unspecified whether stage 3a or 3b CKD Sees Dr. [name]. Recent chemistry showed glucose of 468, creatinine 1.46, GFR 49.  4. Mixed  hyperlipidemia Continue atorvastatin 10 mg daily.  No recent lipid labs available.  Sees [facility] Dr. Gerarda Fraction.  Medication Adjustments/Labs and Tests Ordered: Current medicines are reviewed at length with the patient today.  Concerns regarding medicines are outlined above.   Disposition: Follow-up with Dr. Harl Bowie or APP 6 months  Signed, Levell July, NP 09/20/2019 11:36 AM     [facility] [place], [place], [place] [address] Phone: [phone]; Fax: [phone]

## 2019-09-20 NOTE — Patient Instructions (Addendum)

## 2019-10-10 DIAGNOSIS — M25521 Pain in right elbow: Secondary | ICD-10-CM | POA: Diagnosis not present

## 2019-10-10 DIAGNOSIS — E1165 Type 2 diabetes mellitus with hyperglycemia: Secondary | ICD-10-CM | POA: Diagnosis not present

## 2019-10-25 DIAGNOSIS — R0989 Other specified symptoms and signs involving the circulatory and respiratory systems: Secondary | ICD-10-CM | POA: Diagnosis not present

## 2019-10-25 DIAGNOSIS — G4489 Other headache syndrome: Secondary | ICD-10-CM | POA: Diagnosis not present

## 2019-10-30 DIAGNOSIS — E559 Vitamin D deficiency, unspecified: Secondary | ICD-10-CM | POA: Diagnosis not present

## 2019-10-30 DIAGNOSIS — N1339 Other hydronephrosis: Secondary | ICD-10-CM | POA: Diagnosis not present

## 2019-10-30 DIAGNOSIS — N1831 Chronic kidney disease, stage 3a: Secondary | ICD-10-CM | POA: Diagnosis not present

## 2019-10-30 DIAGNOSIS — R809 Proteinuria, unspecified: Secondary | ICD-10-CM | POA: Diagnosis not present

## 2019-10-30 DIAGNOSIS — I129 Hypertensive chronic kidney disease with stage 1 through stage 4 chronic kidney disease, or unspecified chronic kidney disease: Secondary | ICD-10-CM | POA: Diagnosis not present

## 2019-11-01 DIAGNOSIS — R809 Proteinuria, unspecified: Secondary | ICD-10-CM | POA: Diagnosis not present

## 2019-11-01 DIAGNOSIS — N189 Chronic kidney disease, unspecified: Secondary | ICD-10-CM | POA: Diagnosis not present

## 2019-11-01 DIAGNOSIS — E611 Iron deficiency: Secondary | ICD-10-CM | POA: Diagnosis not present

## 2019-11-01 DIAGNOSIS — I129 Hypertensive chronic kidney disease with stage 1 through stage 4 chronic kidney disease, or unspecified chronic kidney disease: Secondary | ICD-10-CM | POA: Diagnosis not present

## 2019-11-01 DIAGNOSIS — E1122 Type 2 diabetes mellitus with diabetic chronic kidney disease: Secondary | ICD-10-CM | POA: Diagnosis not present

## 2019-11-04 ENCOUNTER — Other Ambulatory Visit (INDEPENDENT_AMBULATORY_CARE_PROVIDER_SITE_OTHER): Payer: Self-pay | Admitting: Gastroenterology

## 2020-01-02 ENCOUNTER — Other Ambulatory Visit (INDEPENDENT_AMBULATORY_CARE_PROVIDER_SITE_OTHER): Payer: Self-pay | Admitting: Internal Medicine

## 2020-01-02 ENCOUNTER — Other Ambulatory Visit: Payer: Self-pay | Admitting: Cardiology

## 2020-01-02 NOTE — Telephone Encounter (Signed)
Patient will need follow up appointment for further refills

## 2020-01-18 DIAGNOSIS — E1165 Type 2 diabetes mellitus with hyperglycemia: Secondary | ICD-10-CM | POA: Diagnosis not present

## 2020-01-21 DIAGNOSIS — Z23 Encounter for immunization: Secondary | ICD-10-CM | POA: Diagnosis not present

## 2020-01-21 DIAGNOSIS — E1122 Type 2 diabetes mellitus with diabetic chronic kidney disease: Secondary | ICD-10-CM | POA: Diagnosis not present

## 2020-01-27 DIAGNOSIS — N189 Chronic kidney disease, unspecified: Secondary | ICD-10-CM | POA: Diagnosis not present

## 2020-01-27 DIAGNOSIS — I129 Hypertensive chronic kidney disease with stage 1 through stage 4 chronic kidney disease, or unspecified chronic kidney disease: Secondary | ICD-10-CM | POA: Diagnosis not present

## 2020-01-27 DIAGNOSIS — E611 Iron deficiency: Secondary | ICD-10-CM | POA: Diagnosis not present

## 2020-01-27 DIAGNOSIS — R809 Proteinuria, unspecified: Secondary | ICD-10-CM | POA: Diagnosis not present

## 2020-01-27 DIAGNOSIS — E1122 Type 2 diabetes mellitus with diabetic chronic kidney disease: Secondary | ICD-10-CM | POA: Diagnosis not present

## 2020-02-13 DIAGNOSIS — R809 Proteinuria, unspecified: Secondary | ICD-10-CM | POA: Diagnosis not present

## 2020-02-13 DIAGNOSIS — N189 Chronic kidney disease, unspecified: Secondary | ICD-10-CM | POA: Diagnosis not present

## 2020-02-13 DIAGNOSIS — I129 Hypertensive chronic kidney disease with stage 1 through stage 4 chronic kidney disease, or unspecified chronic kidney disease: Secondary | ICD-10-CM | POA: Diagnosis not present

## 2020-02-13 DIAGNOSIS — D508 Other iron deficiency anemias: Secondary | ICD-10-CM | POA: Diagnosis not present

## 2020-02-13 DIAGNOSIS — E871 Hypo-osmolality and hyponatremia: Secondary | ICD-10-CM | POA: Diagnosis not present

## 2020-03-10 ENCOUNTER — Other Ambulatory Visit (INDEPENDENT_AMBULATORY_CARE_PROVIDER_SITE_OTHER): Payer: Self-pay

## 2020-03-10 DIAGNOSIS — R197 Diarrhea, unspecified: Secondary | ICD-10-CM

## 2020-03-10 MED ORDER — LOPERAMIDE HCL 2 MG PO CAPS: ORAL_CAPSULE | ORAL | 5 refills | Status: DC

## 2020-04-01 ENCOUNTER — Encounter: Payer: Self-pay | Admitting: *Deleted

## 2020-04-01 ENCOUNTER — Encounter: Payer: Self-pay | Admitting: Cardiology

## 2020-04-01 ENCOUNTER — Ambulatory Visit (INDEPENDENT_AMBULATORY_CARE_PROVIDER_SITE_OTHER): Payer: Medicare Other | Admitting: Cardiology

## 2020-04-01 VITALS — BP 150/78 | HR 78 | Ht 77.0 in | Wt 246.8 lb

## 2020-04-01 DIAGNOSIS — E782 Mixed hyperlipidemia: Secondary | ICD-10-CM

## 2020-04-01 DIAGNOSIS — I10 Essential (primary) hypertension: Secondary | ICD-10-CM

## 2020-04-01 DIAGNOSIS — R0789 Other chest pain: Secondary | ICD-10-CM | POA: Diagnosis not present

## 2020-04-01 NOTE — Progress Notes (Signed)
Clinical Summary [name] is a 70 y.o.male seen today for follow up of the followig medical problems.  1. Atypical chest pain -06/2010 nuclear stress: no ischemia 05/2017 [facility] Nuclear stress: no ischemia 05/2017 [facility] chest: no acute aortic pathology  - isolated episode chest pain since last visit  2. HTN - compliant with meds - home bp's 130s-140s at home.    3. CKD3 - followed by Dr [name]   4. Mentalhandicap  5. DM2 -followed by pcp - defer ACEI consideration to nephrology.    6.Hyperlipidemia -he is on statin - labs followed by pcp  7. History of osteomyelitis - recent amputation of right great toe for osteo   Past Medical History:  Diagnosis Date  . Anxiety   . Arthritis   . Colon polyps   . Depression   . Diabetes mellitus without complication ([place])   . Fatty liver   . GERD (gastroesophageal reflux disease)   . Gout   . High cholesterol   . History of echocardiogram 09/14/2011   EF >55%; borderline concentric LVH;   . History of stress test 06/21/2010   exercise; normal study  . Hypertension   . MET TEST 08/15/2011   moderate peak VO2 limitation at 66% predicted; mod cardiac impairment with low SV & low anaerobic threshold, mild chronotropic incompetence; low risk  . Nausea   . Osteomyelitis ([place])    right great toe  . Renal disorder   . Renal insufficiency   . Thrombocytopenia (HCC)    PMH     Allergies  Allergen Reactions  . Penicillins Other (See Comments)    Unknown Did it involve swelling of the face/tongue/throat, SOB, or low BP? Unknown Did it involve sudden or severe rash/hives, skin peeling, or any reaction on the inside of your mouth or nose? Unknown Did you need to seek medical attention at a hospital or doctor's office? Unknown When did it last happen?childhood allergy If all above answers are "NO", may proceed with cephalosporin use.   Samuel Germany Dye [Iodinated Diagnostic Agents]     Sped heart reate  up   . Tetanus Toxoids Rash     Current Outpatient Medications  Medication Sig Dispense Refill  . acetaminophen (TYLENOL) 500 MG tablet Take 1,000 mg by mouth every 6 (six) hours as needed. Pain    . amLODipine (NORVASC) 10 MG tablet Take 10 mg by mouth daily.  3  . aspirin 81 MG tablet Take 1 tablet (81 mg total) by mouth daily. 30 tablet   . atorvastatin (LIPITOR) 10 MG tablet Take 10 mg by mouth at bedtime.  3  . B Complex-C (B-COMPLEX WITH VITAMIN C) tablet Take 1 tablet by mouth daily.    Marland Kitchen buPROPion (WELLBUTRIN XL) 150 MG 24 hr tablet Take 150 mg by mouth at bedtime.     . carvedilol (COREG) 12.5 MG tablet TAKE ONE TABLET BY MOUTH TWICE DAILY. 180 tablet 1  . cetirizine (ZYRTEC) 10 MG tablet Take 10 mg by mouth daily.    . Cholecalciferol (VITAMIN D) 50 MCG (2000 UT) CAPS Take 2,000 Units by mouth daily.     . ferrous sulfate 325 (65 FE) MG EC tablet Take 325 mg by mouth daily with breakfast.    . glipiZIDE (GLUCOTROL) 5 MG tablet Take 5 mg by mouth daily.  0  . loperamide (IMODIUM) 2 MG capsule TAKE ONE CAPSULE BY MOUTH TWO TIMES DAILY AS NEEDED FOR DIARRHEA OR LOOSE STOOLS 60 capsule 5  .  Omega-3 Fatty Acids (FISH OIL) 1000 MG CAPS Take 1,000 mg by mouth 2 (two) times daily. Afternoon & bedtime.    Marland Kitchen omeprazole (PRILOSEC) 40 MG capsule Take 1 capsule (40 mg total) by mouth daily. 90 capsule 0  . pioglitazone (ACTOS) 30 MG tablet Take 30 mg by mouth daily.  3  . sertraline (ZOLOFT) 100 MG tablet Take 100 mg by mouth at bedtime.   11  . sodium bicarbonate 650 MG tablet Take 650 mg by mouth 2 (two) times daily.  3  . TOUJEO MAX SOLOSTAR 300 UNIT/ML SOPN Inject 10 Units into the skin daily.     Marland Kitchen ULORIC 80 MG TABS Take 80 mg by mouth daily.   99   No current facility-administered medications for this visit.     Past Surgical History:  Procedure Laterality Date  . AMPUTATION Right 02/13/2019   Procedure: RIGHT GREAT TOE AMPUTATION;  Surgeon: [name], MD;  Location: [facility];   Service: Orthopedics;  Laterality: Right;  . CHOLECYSTECTOMY    . COLONOSCOPY N/A 09/21/2012   Procedure: COLONOSCOPY;  Surgeon: Rogene Houston, MD;  [facility];  Service: Endoscopy;  Laterality: N/A;  730  . COLONOSCOPY N/A 01/30/2019   Procedure: COLONOSCOPY;  Surgeon: Rogene Houston, MD;  [facility];  Service: Endoscopy;  Laterality: N/A;  10:30  . HERNIA REPAIR     x 2  . HIP SURGERY     Rt hip age 38  . NASAL POLYP SURGERY      2-3 months ago  . POLYPECTOMY  01/30/2019   Procedure: POLYPECTOMY;  Surgeon: Rogene Houston, MD;  [facility];  Service: Endoscopy;;  proximal transverse colon, cecal     Allergies  Allergen Reactions  . Penicillins Other (See Comments)    Unknown Did it involve swelling of the face/tongue/throat, SOB, or low BP? Unknown Did it involve sudden or severe rash/hives, skin peeling, or any reaction on the inside of your mouth or nose? Unknown Did you need to seek medical attention at a hospital or doctor's office? Unknown When did it last happen?childhood allergy If all above answers are "NO", may proceed with cephalosporin use.   Samuel Germany Dye [Iodinated Diagnostic Agents]     Sped heart reate up   . Tetanus Toxoids Rash      Family History  Problem Relation Age of Onset  . Colon cancer Brother      Social History Mr. Raffel reports that he has never smoked. He has never used smokeless tobacco. Mr. Rehberg reports no history of alcohol use.   Review of Systems CONSTITUTIONAL: No weight loss, fever, chills, weakness or fatigue.  HEENT: Eyes: No visual loss, blurred vision, double vision or yellow sclerae.No hearing loss, sneezing, congestion, runny nose or sore throat.  SKIN: No rash or itching.  CARDIOVASCULAR: per hpi RESPIRATORY: No shortness of breath, cough or sputum.  GASTROINTESTINAL: No anorexia, nausea, vomiting or diarrhea. No abdominal pain or blood.  GENITOURINARY: No burning on  urination, no polyuria NEUROLOGICAL: No headache, dizziness, syncope, paralysis, ataxia, numbness or tingling in the extremities. No change in bowel or bladder control.  MUSCULOSKELETAL: No muscle, back pain, joint pain or stiffness.  LYMPHATICS: No enlarged nodes. No history of splenectomy.  PSYCHIATRIC: No history of depression or anxiety.  ENDOCRINOLOGIC: No reports of sweating, cold or heat intolerance. No polyuria or polydipsia.  Marland Kitchen   Physical Examination Today's Vitals   04/01/20 1137  BP: Marland Kitchen)  150/78  Pulse: 78  SpO2: 98%  Weight: 246 lb 12.8 oz (111.9 kg)  Height: 6' 5"  (1.956 m)   Body mass index is 29.27 kg/m.  Gen: resting comfortably, no acute distress HEENT: no scleral icterus, pupils equal round and reactive, no palptable cervical adenopathy,  CV: RRR, no m/r/g, no jvd Resp: Clear to auscultation bilaterally GI: abdomen is soft, non-tender, non-distended, normal bowel sounds, no hepatosplenomegaly MSK: extremities are warm, no edema.  Skin: warm, no rash Neuro:  no focal deficits Psych: appropriate affect     Assessment and Plan   1. Chest pain -long history of symptoms with negative stress test in 2012 and 2019 -no recent significant symptoms, continue to monitor.   2. HTN - manural recheck was at goal, continue current meds    3. Hyperlipidemia --continue statin, request pcp labs.       Arnoldo Lenis, M.D.

## 2020-04-01 NOTE — Patient Instructions (Signed)
Your physician recommends that you schedule a follow-up appointment in: [place] has recommended you make the following change in your medication:   STOP ASPIRIN   Thank you for choosing [facility]!! /

## 2020-04-13 DIAGNOSIS — I129 Hypertensive chronic kidney disease with stage 1 through stage 4 chronic kidney disease, or unspecified chronic kidney disease: Secondary | ICD-10-CM | POA: Diagnosis not present

## 2020-04-13 DIAGNOSIS — E1122 Type 2 diabetes mellitus with diabetic chronic kidney disease: Secondary | ICD-10-CM | POA: Diagnosis not present

## 2020-04-13 DIAGNOSIS — R809 Proteinuria, unspecified: Secondary | ICD-10-CM | POA: Diagnosis not present

## 2020-04-13 DIAGNOSIS — D508 Other iron deficiency anemias: Secondary | ICD-10-CM | POA: Diagnosis not present

## 2020-04-13 DIAGNOSIS — N189 Chronic kidney disease, unspecified: Secondary | ICD-10-CM | POA: Diagnosis not present

## 2020-04-17 DIAGNOSIS — N189 Chronic kidney disease, unspecified: Secondary | ICD-10-CM | POA: Diagnosis not present

## 2020-04-17 DIAGNOSIS — E611 Iron deficiency: Secondary | ICD-10-CM | POA: Diagnosis not present

## 2020-04-17 DIAGNOSIS — R809 Proteinuria, unspecified: Secondary | ICD-10-CM | POA: Diagnosis not present

## 2020-04-17 DIAGNOSIS — E871 Hypo-osmolality and hyponatremia: Secondary | ICD-10-CM | POA: Diagnosis not present

## 2020-04-17 DIAGNOSIS — I129 Hypertensive chronic kidney disease with stage 1 through stage 4 chronic kidney disease, or unspecified chronic kidney disease: Secondary | ICD-10-CM | POA: Diagnosis not present

## 2020-04-17 DIAGNOSIS — E1122 Type 2 diabetes mellitus with diabetic chronic kidney disease: Secondary | ICD-10-CM | POA: Diagnosis not present

## 2020-04-21 DIAGNOSIS — E1122 Type 2 diabetes mellitus with diabetic chronic kidney disease: Secondary | ICD-10-CM | POA: Diagnosis not present

## 2020-04-21 DIAGNOSIS — Z0000 Encounter for general adult medical examination without abnormal findings: Secondary | ICD-10-CM | POA: Diagnosis not present

## 2020-04-21 DIAGNOSIS — E1165 Type 2 diabetes mellitus with hyperglycemia: Secondary | ICD-10-CM | POA: Diagnosis not present

## 2020-04-21 DIAGNOSIS — Z1389 Encounter for screening for other disorder: Secondary | ICD-10-CM | POA: Diagnosis not present

## 2020-04-21 DIAGNOSIS — E7849 Other hyperlipidemia: Secondary | ICD-10-CM | POA: Diagnosis not present

## 2020-04-22 DIAGNOSIS — E1165 Type 2 diabetes mellitus with hyperglycemia: Secondary | ICD-10-CM | POA: Diagnosis not present

## 2020-04-22 DIAGNOSIS — R5381 Other malaise: Secondary | ICD-10-CM | POA: Diagnosis not present

## 2020-04-22 DIAGNOSIS — R739 Hyperglycemia, unspecified: Secondary | ICD-10-CM | POA: Diagnosis not present

## 2020-04-27 ENCOUNTER — Other Ambulatory Visit (INDEPENDENT_AMBULATORY_CARE_PROVIDER_SITE_OTHER): Payer: Self-pay | Admitting: Internal Medicine

## 2020-04-27 NOTE — Telephone Encounter (Signed)
Correction, I refilled his medication three months ago, he needs an appointment for furhter refills

## 2020-04-27 NOTE — Telephone Encounter (Signed)
Will prescribe for three months, needs appointment for further refills.

## 2020-07-13 DIAGNOSIS — R000 Tachycardia, unspecified: Secondary | ICD-10-CM | POA: Diagnosis not present

## 2020-07-13 DIAGNOSIS — I10 Essential (primary) hypertension: Secondary | ICD-10-CM | POA: Diagnosis not present

## 2020-08-05 DIAGNOSIS — I10 Essential (primary) hypertension: Secondary | ICD-10-CM | POA: Diagnosis not present

## 2020-08-05 DIAGNOSIS — R0602 Shortness of breath: Secondary | ICD-10-CM | POA: Diagnosis not present

## 2020-08-05 DIAGNOSIS — E1165 Type 2 diabetes mellitus with hyperglycemia: Secondary | ICD-10-CM | POA: Diagnosis not present

## 2020-08-24 DIAGNOSIS — R809 Proteinuria, unspecified: Secondary | ICD-10-CM | POA: Diagnosis not present

## 2020-08-24 DIAGNOSIS — N189 Chronic kidney disease, unspecified: Secondary | ICD-10-CM | POA: Diagnosis not present

## 2020-08-24 DIAGNOSIS — E1122 Type 2 diabetes mellitus with diabetic chronic kidney disease: Secondary | ICD-10-CM | POA: Diagnosis not present

## 2020-08-24 DIAGNOSIS — E871 Hypo-osmolality and hyponatremia: Secondary | ICD-10-CM | POA: Diagnosis not present

## 2020-08-24 DIAGNOSIS — I129 Hypertensive chronic kidney disease with stage 1 through stage 4 chronic kidney disease, or unspecified chronic kidney disease: Secondary | ICD-10-CM | POA: Diagnosis not present

## 2020-08-28 DIAGNOSIS — N189 Chronic kidney disease, unspecified: Secondary | ICD-10-CM | POA: Diagnosis not present

## 2020-08-28 DIAGNOSIS — E1122 Type 2 diabetes mellitus with diabetic chronic kidney disease: Secondary | ICD-10-CM | POA: Diagnosis not present

## 2020-08-28 DIAGNOSIS — E1129 Type 2 diabetes mellitus with other diabetic kidney complication: Secondary | ICD-10-CM | POA: Diagnosis not present

## 2020-08-28 DIAGNOSIS — E871 Hypo-osmolality and hyponatremia: Secondary | ICD-10-CM | POA: Diagnosis not present

## 2020-08-28 DIAGNOSIS — R809 Proteinuria, unspecified: Secondary | ICD-10-CM | POA: Diagnosis not present

## 2020-08-28 DIAGNOSIS — I129 Hypertensive chronic kidney disease with stage 1 through stage 4 chronic kidney disease, or unspecified chronic kidney disease: Secondary | ICD-10-CM | POA: Diagnosis not present

## 2020-09-03 ENCOUNTER — Other Ambulatory Visit (INDEPENDENT_AMBULATORY_CARE_PROVIDER_SITE_OTHER): Payer: Self-pay | Admitting: Internal Medicine

## 2020-09-03 DIAGNOSIS — R197 Diarrhea, unspecified: Secondary | ICD-10-CM

## 2020-09-03 NOTE — Telephone Encounter (Signed)
Will refill for 4 months until appoointment

## 2020-09-29 ENCOUNTER — Encounter: Payer: Self-pay | Admitting: *Deleted

## 2020-09-29 ENCOUNTER — Ambulatory Visit (INDEPENDENT_AMBULATORY_CARE_PROVIDER_SITE_OTHER): Payer: Medicare Other | Admitting: Cardiology

## 2020-09-29 ENCOUNTER — Encounter: Payer: Self-pay | Admitting: Cardiology

## 2020-09-29 VITALS — BP 140/70 | HR 88 | Ht 77.0 in | Wt 255.4 lb

## 2020-09-29 DIAGNOSIS — E782 Mixed hyperlipidemia: Secondary | ICD-10-CM | POA: Diagnosis not present

## 2020-09-29 DIAGNOSIS — R0789 Other chest pain: Secondary | ICD-10-CM

## 2020-09-29 DIAGNOSIS — I10 Essential (primary) hypertension: Secondary | ICD-10-CM | POA: Diagnosis not present

## 2020-09-29 NOTE — Progress Notes (Signed)
Clinical Summary Mr. Bunn is a 70 y.o.male seen today for follow up of the followig medical problems.    1. Atypical chest pain -  06/2010 nuclear stress: no ischemia  05/2017 [facility] Nuclear stress: no ischemia 05/2017 [facility] chest: no acute aortic pathology   - isolated episode chest pain since last visit   - occasional SOB with exertion, which is stable - no chest pains.    2. HTN - renal changed coreg to labetalol for better bp effects      3. CKD 3 - followed by Dr [name]     4. Mental handicap   5. DM2 - followed by pcp - defer ACEI consideration to nephrology.    HgbA1c 10.8 per family report of labs   6. Hyperlipidemia - labs followed by pcp  04/2020 TC 163 TG 480 HDL 31 LDL 59   7. History of osteomyelitis - prior amputation of right great toe for osteo   Past Medical History:  Diagnosis Date   Anxiety    Arthritis    Colon polyps    Depression    Diabetes mellitus without complication (HCC)    Fatty liver    GERD (gastroesophageal reflux disease)    Gout    High cholesterol    History of echocardiogram 09/14/2011   EF >55%; borderline concentric LVH;    History of stress test 06/21/2010   exercise; normal study   Hypertension    MET TEST 08/15/2011   moderate peak VO2 limitation at 66% predicted; mod cardiac impairment with low SV & low anaerobic threshold, mild chronotropic incompetence; low risk   Nausea    Osteomyelitis (HCC)    right great toe   Renal disorder    Renal insufficiency    Thrombocytopenia (HCC)    PMH     Allergies  Allergen Reactions   Penicillins Other (See Comments)    Unknown Did it involve swelling of the face/tongue/throat, SOB, or low BP? Unknown Did it involve sudden or severe rash/hives, skin peeling, or any reaction on the inside of your mouth or nose? Unknown Did you need to seek medical attention at a hospital or doctor's office? Unknown When did it last happen?      childhood allergy If all above  answers are "NO", may proceed with cephalosporin use.    Ivp Dye [Iodinated Diagnostic Agents]     Sped heart reate up    Tetanus Toxoids Rash     Current Outpatient Medications  Medication Sig Dispense Refill   acetaminophen (TYLENOL) 500 MG tablet Take 1,000 mg by mouth every 6 (six) hours as needed. Pain     amLODipine (NORVASC) 10 MG tablet Take 10 mg by mouth daily.  3   atorvastatin (LIPITOR) 10 MG tablet Take 10 mg by mouth at bedtime.  3   B Complex-C (B-COMPLEX WITH VITAMIN C) tablet Take 1 tablet by mouth daily.     buPROPion (WELLBUTRIN XL) 150 MG 24 hr tablet Take 150 mg by mouth at bedtime.      carvedilol (COREG) 12.5 MG tablet TAKE ONE TABLET BY MOUTH TWICE DAILY. 180 tablet 1   cetirizine (ZYRTEC) 10 MG tablet Take 10 mg by mouth daily.     Cholecalciferol (VITAMIN D) 50 MCG (2000 UT) CAPS Take 2,000 Units by mouth daily.      enalapril (VASOTEC) 10 MG tablet Take 10 mg by mouth daily.     ferrous sulfate 325 (65 FE) MG EC tablet  Take 325 mg by mouth daily with breakfast.     glipiZIDE (GLUCOTROL) 5 MG tablet Take 5 mg by mouth daily.  0   loperamide (IMODIUM) 2 MG capsule TAKE ONE CAPSULE BY MOUTH TWO TIMES DAILY AS NEEDED FOR DIARRHEA OR LOOSE STOOLS 60 capsule 3   Omega-3 Fatty Acids (FISH OIL) 1000 MG CAPS Take 1,000 mg by mouth 2 (two) times daily. Afternoon & bedtime.     omeprazole (PRILOSEC) 40 MG capsule Take 1 capsule (40 mg total) by mouth daily. 90 capsule 0   pioglitazone (ACTOS) 30 MG tablet Take 30 mg by mouth daily.  3   sertraline (ZOLOFT) 100 MG tablet Take 100 mg by mouth at bedtime.   11   sodium bicarbonate 650 MG tablet Take 650 mg by mouth 2 (two) times daily.  3   TOUJEO MAX SOLOSTAR 300 UNIT/ML SOPN Inject 20 Units into the skin daily.     ULORIC 80 MG TABS Take 80 mg by mouth daily.   99   No current facility-administered medications for this visit.     Past Surgical History:  Procedure Laterality Date   AMPUTATION Right 02/13/2019    Procedure: RIGHT GREAT TOE AMPUTATION;  Surgeon: [name], MD;  Location: [place];  Service: Orthopedics;  Laterality: Right;   CHOLECYSTECTOMY     COLONOSCOPY N/A 09/21/2012   Procedure: COLONOSCOPY;  Surgeon: Rogene Houston, MD;  [facility];  Service: Endoscopy;  Laterality: N/A;  730   COLONOSCOPY N/A 01/30/2019   Procedure: COLONOSCOPY;  Surgeon: Rogene Houston, MD;  [facility];  Service: Endoscopy;  Laterality: N/A;  10:30   HERNIA REPAIR     x 2   HIP SURGERY     Rt hip age 32   NASAL POLYP SURGERY      2-3 months ago   POLYPECTOMY  01/30/2019   Procedure: POLYPECTOMY;  Surgeon: Rogene Houston, MD;  [facility];  Service: Endoscopy;;  proximal transverse colon, cecal     Allergies  Allergen Reactions   Penicillins Other (See Comments)    Unknown Did it involve swelling of the face/tongue/throat, SOB, or low BP? Unknown Did it involve sudden or severe rash/hives, skin peeling, or any reaction on the inside of your mouth or nose? Unknown Did you need to seek medical attention at a hospital or doctor's office? Unknown When did it last happen?      childhood allergy If all above answers are "NO", may proceed with cephalosporin use.    Ivp Dye [Iodinated Diagnostic Agents]     Sped heart reate up    Tetanus Toxoids Rash      Family History  Problem Relation Age of Onset   Colon cancer Brother      Social History Mr. Duesing reports that he has never smoked. He has never used smokeless tobacco. Mr. Poffenberger reports no history of alcohol use.   Review of Systems CONSTITUTIONAL: No weight loss, fever, chills, weakness or fatigue.  HEENT: Eyes: No visual loss, blurred vision, double vision or yellow sclerae.No hearing loss, sneezing, congestion, runny nose or sore throat.  SKIN: No rash or itching.  CARDIOVASCULAR: per hpi RESPIRATORY: No shortness of breath, cough or sputum.  GASTROINTESTINAL: No anorexia, nausea, vomiting  or diarrhea. No abdominal pain or blood.  GENITOURINARY: No burning on urination, no polyuria NEUROLOGICAL: No headache, dizziness, syncope, paralysis, ataxia, numbness or tingling in the extremities. No change in bowel or bladder  control.  MUSCULOSKELETAL: No muscle, back pain, joint pain or stiffness.  LYMPHATICS: No enlarged nodes. No history of splenectomy.  PSYCHIATRIC: No history of depression or anxiety.  ENDOCRINOLOGIC: No reports of sweating, cold or heat intolerance. No polyuria or polydipsia.  Marland Kitchen   Physical Examination Today's Vitals   09/29/20 1132  BP: 140/70  Pulse: 88  SpO2: 98%  Weight: 255 lb 6.4 oz (115.8 kg)  Height: 6' 5"  (1.956 m)   Body mass index is 30.29 kg/m.  Gen: resting comfortably, no acute distress HEENT: no scleral icterus, pupils equal round and reactive, no palptable cervical adenopathy,  CV: RRR, no m/r/g no jvd Resp: Clear to auscultation bilaterally GI: abdomen is soft, non-tender, non-distended, normal bowel sounds, no hepatosplenomegaly MSK: extremities are warm, trace bilateral edema Skin: warm, no rash Neuro:  no focal deficits Psych: appropriate affect   Diagnostic Studies     Assessment and Plan  1. Chest pain - long history of symptoms with negative stress test in 2012 and 2019 -denies any recent symptoms, continue to monitor EKG today shows NSR, no acute ischemic changes   2. HTN - elevated in clinic. Will monitor home bp's to clarify if any component of white coat HTN.     3. Hyperlipidemia --LDL at goa, continue atorvastatin. Work on glucose control to improve TGs  4. LE edema - mild and asymptomatic, monitor at this time, if progresses would need to repeat echo and consider diuretic.      Arnoldo Lenis, M.D.

## 2020-09-29 NOTE — Patient Instructions (Addendum)

## 2020-11-02 DIAGNOSIS — R739 Hyperglycemia, unspecified: Secondary | ICD-10-CM | POA: Diagnosis not present

## 2020-11-02 DIAGNOSIS — R000 Tachycardia, unspecified: Secondary | ICD-10-CM | POA: Diagnosis not present

## 2020-11-02 DIAGNOSIS — I10 Essential (primary) hypertension: Secondary | ICD-10-CM | POA: Diagnosis not present

## 2020-11-09 DIAGNOSIS — E1122 Type 2 diabetes mellitus with diabetic chronic kidney disease: Secondary | ICD-10-CM | POA: Diagnosis not present

## 2020-11-09 DIAGNOSIS — E871 Hypo-osmolality and hyponatremia: Secondary | ICD-10-CM | POA: Diagnosis not present

## 2020-11-09 DIAGNOSIS — R809 Proteinuria, unspecified: Secondary | ICD-10-CM | POA: Diagnosis not present

## 2020-11-09 DIAGNOSIS — I129 Hypertensive chronic kidney disease with stage 1 through stage 4 chronic kidney disease, or unspecified chronic kidney disease: Secondary | ICD-10-CM | POA: Diagnosis not present

## 2020-11-09 DIAGNOSIS — N189 Chronic kidney disease, unspecified: Secondary | ICD-10-CM | POA: Diagnosis not present

## 2020-11-13 DIAGNOSIS — R809 Proteinuria, unspecified: Secondary | ICD-10-CM | POA: Diagnosis not present

## 2020-11-13 DIAGNOSIS — N189 Chronic kidney disease, unspecified: Secondary | ICD-10-CM | POA: Diagnosis not present

## 2020-11-13 DIAGNOSIS — E1122 Type 2 diabetes mellitus with diabetic chronic kidney disease: Secondary | ICD-10-CM | POA: Diagnosis not present

## 2020-11-13 DIAGNOSIS — D631 Anemia in chronic kidney disease: Secondary | ICD-10-CM | POA: Diagnosis not present

## 2020-11-13 DIAGNOSIS — E1129 Type 2 diabetes mellitus with other diabetic kidney complication: Secondary | ICD-10-CM | POA: Diagnosis not present

## 2020-11-13 DIAGNOSIS — I129 Hypertensive chronic kidney disease with stage 1 through stage 4 chronic kidney disease, or unspecified chronic kidney disease: Secondary | ICD-10-CM | POA: Diagnosis not present

## 2020-11-13 DIAGNOSIS — E871 Hypo-osmolality and hyponatremia: Secondary | ICD-10-CM | POA: Diagnosis not present

## 2020-12-07 ENCOUNTER — Ambulatory Visit (INDEPENDENT_AMBULATORY_CARE_PROVIDER_SITE_OTHER): Payer: Medicare Other | Admitting: Gastroenterology

## 2020-12-14 DIAGNOSIS — E1165 Type 2 diabetes mellitus with hyperglycemia: Secondary | ICD-10-CM | POA: Diagnosis not present

## 2020-12-14 DIAGNOSIS — M25551 Pain in right hip: Secondary | ICD-10-CM | POA: Diagnosis not present

## 2020-12-22 ENCOUNTER — Other Ambulatory Visit: Payer: Self-pay

## 2020-12-22 ENCOUNTER — Encounter (INDEPENDENT_AMBULATORY_CARE_PROVIDER_SITE_OTHER): Payer: Self-pay | Admitting: Gastroenterology

## 2020-12-22 ENCOUNTER — Ambulatory Visit (INDEPENDENT_AMBULATORY_CARE_PROVIDER_SITE_OTHER): Payer: Medicare Other | Admitting: Gastroenterology

## 2020-12-22 VITALS — BP 146/69 | HR 98 | Temp 99.1°F | Ht 77.0 in | Wt 248.0 lb

## 2020-12-22 DIAGNOSIS — K219 Gastro-esophageal reflux disease without esophagitis: Secondary | ICD-10-CM | POA: Diagnosis not present

## 2020-12-22 MED ORDER — OMEPRAZOLE 20 MG PO CPDR: 20.0000 mg | DELAYED_RELEASE_CAPSULE | Freq: Every day | ORAL | 3 refills | Status: DC

## 2020-12-22 NOTE — Progress Notes (Addendum)
Referring Provider: Redmond School, MD Primary Care Physician:  Redmond School, MD Primary GI Physician: Rehman  Chief Complaint  Patient presents with   Gastroesophageal Reflux    Pt arrives with family member [name]. Having some issues with reflux.    HPI:   [name] is a 70 y.o. male with past medical history of anxiety, colon polyps, depression, DM, fatty liver, mental disability, GERD, high cholesterol, HTN, renal insufficiency, thrombocytopenia.  Patient presenting today with c/o Reflux, last seen in clinic Nov 2020. He is accompanied by [name] who helps provide history.  Patient was previously on omeprazole 51m daily. They report that he ran out of 460momeprazole and was unable to get a refill due to not having been seen in over a year, had started on 2068mmeprazole otc, which seems to be working fairly well for him. [facility] that she gets all of patients meds organized for him to take each day, however, he sometimes is non compliant as he lives alone. He denies dysphagia. He reports that he may have one episode of heartburn per week if that. He denies weight loss, changes in appetite, abdominal pain, early satiety, nausea, vomiting, sore throat or cough.  He reports that he has 3-4 formed stools per day, occasional diarrhea but usually related to something he ate.  Hgb 12.9 at the end of September, however, patient has hx of renal insufficiency and is followed by nephrology, he has no fatigue, lightheadedness, sob, dizziness, melena or rectal bleeding. Iron studies earlier in the year were normal.  NSAID use: occasionally.  Social hx:no etoh, drugs, tobacco Fam hx: brother had CRC  Last Colonoscopy:01/30/19- Eight 4 to 7 mm polyps in the rectum, in the sigmoid colon, at the splenic flexure, in the transverse colon and in the cecum, - One 9 mm polyp at the recto-sigmoid colon, removed with a hot snare. Resected and retrieved. Clip (MR conditional) was  placed. Comment; nine polyps removed(tubular adenomas) Last Endoscopy:11/2005 normal mucosa and GE junction  Recommendations:  Repeat colonoscopy in dec 2023  Past Medical History:  Diagnosis Date   Anxiety    Arthritis    Colon polyps    Depression    Diabetes mellitus without complication (HCC[facility]obel  Fatty liver    GERD (gastroesophageal reflux disease)    Gout    High cholesterol    History of echocardiogram 09/14/2011   EF >55%; borderline concentric LVH;    History of stress test 06/21/2010   exercise; normal study   Hypertension    MET TEST 08/15/2011   moderate peak VO2 limitation at 66% predicted; mod cardiac impairment with low SV & low anaerobic threshold, mild chronotropic incompetence; low risk   Nausea    Osteomyelitis (HCC)    right great toe   Renal disorder    Renal insufficiency    Thrombocytopenia (HCCLorain  PMH    Past Surgical History:  Procedure Laterality Date   AMPUTATION Right 02/13/2019   Procedure: RIGHT GREAT TOE AMPUTATION;  Surgeon: DudNewt MinionD;  [facility];  Laterality: Right;   CHOLECYSTECTOMY     COLONOSCOPY N/A 09/21/2012   Procedure: COLONOSCOPY;  Surgeon: NajRogene HoustonD;  [facility];  Service: Endoscopy;  Laterality: N/A;  730   COLONOSCOPY N/A 01/30/2019   Procedure: COLONOSCOPY;  Surgeon: RehRogene HoustonD;  [facility];  Service: Endoscopy;  Laterality: N/A;  10:30  HERNIA REPAIR     x 2   HIP SURGERY     Rt hip age 9   NASAL POLYP SURGERY      2-3 months ago   POLYPECTOMY  01/30/2019   Procedure: POLYPECTOMY;  Surgeon: Rogene Houston, MD;  [facility];  Service: Endoscopy;;  proximal transverse colon, cecal    Current Outpatient Medications  Medication Sig Dispense Refill   acetaminophen (TYLENOL) 500 MG tablet Take 1,000 mg by mouth every 6 (six) hours as needed. Pain     atorvastatin (LIPITOR) 10 MG tablet Take 10 mg by mouth at bedtime.  3   B  Complex-C (B-COMPLEX WITH VITAMIN C) tablet Take 1 tablet by mouth daily.     buPROPion (WELLBUTRIN XL) 150 MG 24 hr tablet Take 150 mg by mouth at bedtime.      cetirizine (ZYRTEC) 10 MG tablet Take 10 mg by mouth daily.     Cholecalciferol (VITAMIN D) 50 MCG (2000 UT) CAPS Take 2,000 Units by mouth daily.      enalapril (VASOTEC) 10 MG tablet Take 10 mg by mouth daily.     glipiZIDE (GLUCOTROL) 5 MG tablet Take 5 mg by mouth 2 (two) times daily before a meal.  0   labetalol (NORMODYNE) 200 MG tablet Take 400 mg by mouth 2 (two) times daily.     loperamide (IMODIUM) 2 MG capsule TAKE ONE CAPSULE BY MOUTH TWO TIMES DAILY AS NEEDED FOR DIARRHEA OR LOOSE STOOLS 60 capsule 3   NIFEdipine (PROCARDIA XL/NIFEDICAL XL) 60 MG 24 hr tablet Take 60 mg by mouth daily.     Omega-3 Fatty Acids (FISH OIL) 1000 MG CAPS Take 1,000 mg by mouth 2 (two) times daily. Afternoon & bedtime.     omeprazole (PRILOSEC) 40 MG capsule Take 1 capsule (40 mg total) by mouth daily. 90 capsule 0   pioglitazone (ACTOS) 30 MG tablet Take 30 mg by mouth daily.  3   sertraline (ZOLOFT) 100 MG tablet Take 100 mg by mouth at bedtime.   11   sodium bicarbonate 650 MG tablet Take 650 mg by mouth 2 (two) times daily.  3   TOUJEO MAX SOLOSTAR 300 UNIT/ML SOPN Inject 20 Units into the skin daily.     ULORIC 80 MG TABS Take 80 mg by mouth daily.   99   ferrous sulfate 325 (65 FE) MG EC tablet Take 325 mg by mouth daily with breakfast. (Patient not taking: Reported on 12/22/2020)     No current facility-administered medications for this visit.    Allergies as of 12/22/2020 - Review Complete 12/22/2020  Allergen Reaction Noted   Penicillins Other (See Comments) 07/20/2010   Ivp dye [iodinated diagnostic agents]  08/14/2017   Tetanus toxoids Rash 07/20/2010    Family History  Problem Relation Age of Onset   Colon cancer Brother     Social History   Socioeconomic History   Marital status: Single    Spouse name: Not on file    Number of children: Not on file   Years of education: Not on file   Highest education level: Not on file  Occupational History   Not on file  Tobacco Use   Smoking status: Never   Smokeless tobacco: Never  Vaping Use   Vaping Use: Never used  Substance and Sexual Activity   Alcohol use: No   Drug use: No   Sexual activity: Not on file  Other Topics Concern   Not on  file  Social History Narrative   Not on file   Social Determinants of Health   Financial Resource Strain: Not on file  Food Insecurity: Not on file  Transportation Needs: Not on file  Physical Activity: Not on file  Stress: Not on file  Social Connections: Not on file   Review of systems General: negative for malaise, night sweats, fever, chills, weight los Neck: Negative for lumps, goiter, pain and significant neck swelling Resp: Negative for cough, wheezing, dyspnea at rest CV: Negative for chest pain, leg swelling, palpitations, orthopnea GI: denies melena, hematochezia, nausea, vomiting, diarrhea, constipation, dysphagia, odyonophagia, early satiety or unintentional weight loss.  MSK: Negative for joint pain or swelling, back pain, and muscle pain. Derm: Negative for itching or rash Psych: Denies depression, anxiety, memory loss, confusion. No homicidal or suicidal ideation.  Heme: Negative for prolonged bleeding, bruising easily, and swollen nodes. Endocrine: Negative for cold or heat intolerance, polyuria, polydipsia and goiter. Neuro: negative for tremor, gait imbalance, syncope and seizures. The remainder of the review of systems is noncontributory.  Physical Exam: BP (!) 146/69 (BP Location: Left Arm, Patient Position: Sitting, Cuff Size: Large)   Pulse 98   Temp 99.1 F (37.3 C) (Oral)   Ht _0  (1.956 m)   Wt 248 lb (112.5 kg)   BMI 29.41 kg/m  General:   Alert and oriented. No distress noted. Pleasant and cooperative.  Head:  Normocephalic and atraumatic. Eyes:  Conjuctiva clear without  scleral icterus. Mouth:  Oral mucosa pink and moist. Good dentition. No lesions. Heart: Normal rate and rhythm, s1 and s2 heart sounds present.  Lungs: Clear lung sounds in all lobes. Respirations equal and unlabored. Abdomen:  +BS, soft, non-tender and non-distended. No rebound or guarding. No HSM or masses noted. Derm: No palmar erythema or jaundice Msk:  Symmetrical without gross deformities. Normal posture. Extremities:  Without edema. Neurologic:  Alert and  oriented x4 Psych:  Alert and cooperative. Normal mood and affect.  Invalid input(s): 6 MONTHS   ASSESSMENT: KEISTON MANLEY is a 70 y.o. male presenting today for follow up of GERD.  Previously on omeprazole 38m once daily, had to start otc omeprazole 260mdue to running out of prescription, reflux seems well controlled on this, patient reports infrequent heartburn, caregiver that accompanies patient reports he has not really complained of heartburn recently. He denies dysphagia, nausea, vomiting, early satiety, weight loss, changes in appetite. We will continue with Omeprazole 2056mt this time, if patient begins experiencing more episodes of reflux related symptoms, caregiver will let me know and we can increase back to 22m48m Hgb 12.9 in September, no melena, rectal bleeding, fatigue, sob, dizziness or lightheadedness, iron studies in feb WNL. He is followed by nephrology for renal insufficiency and will have labs done again in January. They will let me know if he develops any signs of GI bleeding, dizziness, sob fatigue or lightheadedness.    PLAN:  Rx omeprazole 20mg35me daily 2. Caregiver to let me know if patient develops any signs of GI bleeding 3. Plan for colonosocopy dec 2023   Follow Up: 1 year  Jerline Linzy L. CarlaAlver Sorrow, APRN, AGNP-C Adult-Gerontology Nurse Practitioner [facility]

## 2020-12-22 NOTE — Patient Instructions (Signed)
I have sent refill of omeprazole 20mg  to do once daily, please let me know if reflux is not well controlled on this and we can go back to 40 mg.  If you develop any black stools, blood in your stools, fatigue, shortness of breath or dizziness please let me know. Your hemoglobin was slightly low in September, however, this is likely related to your kidney issues. If you develop any of these symptoms we may need to consider further evaluation.  We will plan to see you again in 1 year

## 2021-01-04 ENCOUNTER — Other Ambulatory Visit (INDEPENDENT_AMBULATORY_CARE_PROVIDER_SITE_OTHER): Payer: Self-pay | Admitting: Gastroenterology

## 2021-01-04 DIAGNOSIS — R197 Diarrhea, unspecified: Secondary | ICD-10-CM

## 2021-01-04 NOTE — Telephone Encounter (Signed)
Seen 12/22/2020 by Vikki Ports

## 2021-02-04 DIAGNOSIS — E1122 Type 2 diabetes mellitus with diabetic chronic kidney disease: Secondary | ICD-10-CM | POA: Diagnosis not present

## 2021-02-04 DIAGNOSIS — N189 Chronic kidney disease, unspecified: Secondary | ICD-10-CM | POA: Diagnosis not present

## 2021-02-04 DIAGNOSIS — E871 Hypo-osmolality and hyponatremia: Secondary | ICD-10-CM | POA: Diagnosis not present

## 2021-02-04 DIAGNOSIS — I129 Hypertensive chronic kidney disease with stage 1 through stage 4 chronic kidney disease, or unspecified chronic kidney disease: Secondary | ICD-10-CM | POA: Diagnosis not present

## 2021-02-04 DIAGNOSIS — E1129 Type 2 diabetes mellitus with other diabetic kidney complication: Secondary | ICD-10-CM | POA: Diagnosis not present

## 2021-02-10 DIAGNOSIS — R809 Proteinuria, unspecified: Secondary | ICD-10-CM | POA: Diagnosis not present

## 2021-02-10 DIAGNOSIS — E1129 Type 2 diabetes mellitus with other diabetic kidney complication: Secondary | ICD-10-CM | POA: Diagnosis not present

## 2021-02-10 DIAGNOSIS — N189 Chronic kidney disease, unspecified: Secondary | ICD-10-CM | POA: Diagnosis not present

## 2021-02-10 DIAGNOSIS — E611 Iron deficiency: Secondary | ICD-10-CM | POA: Diagnosis not present

## 2021-02-10 DIAGNOSIS — E1122 Type 2 diabetes mellitus with diabetic chronic kidney disease: Secondary | ICD-10-CM | POA: Diagnosis not present

## 2021-02-10 DIAGNOSIS — I129 Hypertensive chronic kidney disease with stage 1 through stage 4 chronic kidney disease, or unspecified chronic kidney disease: Secondary | ICD-10-CM | POA: Diagnosis not present

## 2021-02-10 DIAGNOSIS — Z5181 Encounter for therapeutic drug level monitoring: Secondary | ICD-10-CM | POA: Diagnosis not present

## 2021-03-24 DIAGNOSIS — L03114 Cellulitis of left upper limb: Secondary | ICD-10-CM | POA: Diagnosis not present

## 2021-03-24 DIAGNOSIS — E1165 Type 2 diabetes mellitus with hyperglycemia: Secondary | ICD-10-CM | POA: Diagnosis not present

## 2021-03-24 DIAGNOSIS — M7022 Olecranon bursitis, left elbow: Secondary | ICD-10-CM | POA: Diagnosis not present

## 2021-05-04 ENCOUNTER — Other Ambulatory Visit (INDEPENDENT_AMBULATORY_CARE_PROVIDER_SITE_OTHER): Payer: Self-pay | Admitting: Gastroenterology

## 2021-05-04 DIAGNOSIS — R197 Diarrhea, unspecified: Secondary | ICD-10-CM

## 2021-05-05 NOTE — Telephone Encounter (Signed)
Noted  

## 2021-05-21 DIAGNOSIS — I129 Hypertensive chronic kidney disease with stage 1 through stage 4 chronic kidney disease, or unspecified chronic kidney disease: Secondary | ICD-10-CM | POA: Diagnosis not present

## 2021-05-21 DIAGNOSIS — E1122 Type 2 diabetes mellitus with diabetic chronic kidney disease: Secondary | ICD-10-CM | POA: Diagnosis not present

## 2021-05-21 DIAGNOSIS — N189 Chronic kidney disease, unspecified: Secondary | ICD-10-CM | POA: Diagnosis not present

## 2021-05-21 DIAGNOSIS — E1129 Type 2 diabetes mellitus with other diabetic kidney complication: Secondary | ICD-10-CM | POA: Diagnosis not present

## 2021-05-21 DIAGNOSIS — E611 Iron deficiency: Secondary | ICD-10-CM | POA: Diagnosis not present

## 2021-05-26 DIAGNOSIS — E1129 Type 2 diabetes mellitus with other diabetic kidney complication: Secondary | ICD-10-CM | POA: Diagnosis not present

## 2021-05-26 DIAGNOSIS — I129 Hypertensive chronic kidney disease with stage 1 through stage 4 chronic kidney disease, or unspecified chronic kidney disease: Secondary | ICD-10-CM | POA: Diagnosis not present

## 2021-05-26 DIAGNOSIS — R809 Proteinuria, unspecified: Secondary | ICD-10-CM | POA: Diagnosis not present

## 2021-05-26 DIAGNOSIS — E1122 Type 2 diabetes mellitus with diabetic chronic kidney disease: Secondary | ICD-10-CM | POA: Diagnosis not present

## 2021-05-26 DIAGNOSIS — E871 Hypo-osmolality and hyponatremia: Secondary | ICD-10-CM | POA: Diagnosis not present

## 2021-05-26 DIAGNOSIS — Z5181 Encounter for therapeutic drug level monitoring: Secondary | ICD-10-CM | POA: Diagnosis not present

## 2021-05-26 DIAGNOSIS — N189 Chronic kidney disease, unspecified: Secondary | ICD-10-CM | POA: Diagnosis not present

## 2021-06-12 DIAGNOSIS — R531 Weakness: Secondary | ICD-10-CM | POA: Diagnosis not present

## 2021-06-30 DIAGNOSIS — R739 Hyperglycemia, unspecified: Secondary | ICD-10-CM | POA: Diagnosis not present

## 2021-07-03 ENCOUNTER — Encounter (HOSPITAL_COMMUNITY): Payer: Self-pay

## 2021-07-03 ENCOUNTER — Emergency Department (HOSPITAL_COMMUNITY)
Admission: EM | Admit: 2021-07-03 | Discharge: 2021-07-03 | Disposition: A | Discharge status: Home / Self Care | Payer: Medicare Other | Attending: Emergency Medicine | Admitting: Emergency Medicine

## 2021-07-03 ENCOUNTER — Other Ambulatory Visit: Payer: Self-pay

## 2021-07-03 ENCOUNTER — Emergency Department (HOSPITAL_COMMUNITY): Payer: Medicare Other

## 2021-07-03 DIAGNOSIS — Z794 Long term (current) use of insulin: Secondary | ICD-10-CM | POA: Insufficient documentation

## 2021-07-03 DIAGNOSIS — R739 Hyperglycemia, unspecified: Secondary | ICD-10-CM | POA: Diagnosis not present

## 2021-07-03 DIAGNOSIS — M79602 Pain in left arm: Secondary | ICD-10-CM

## 2021-07-03 DIAGNOSIS — E1165 Type 2 diabetes mellitus with hyperglycemia: Secondary | ICD-10-CM | POA: Insufficient documentation

## 2021-07-03 DIAGNOSIS — R7989 Other specified abnormal findings of blood chemistry: Secondary | ICD-10-CM | POA: Diagnosis not present

## 2021-07-03 DIAGNOSIS — I10 Essential (primary) hypertension: Secondary | ICD-10-CM | POA: Insufficient documentation

## 2021-07-03 DIAGNOSIS — R531 Weakness: Secondary | ICD-10-CM | POA: Insufficient documentation

## 2021-07-03 DIAGNOSIS — Z7984 Long term (current) use of oral hypoglycemic drugs: Secondary | ICD-10-CM | POA: Diagnosis not present

## 2021-07-03 DIAGNOSIS — M79605 Pain in left leg: Secondary | ICD-10-CM | POA: Insufficient documentation

## 2021-07-03 DIAGNOSIS — M79622 Pain in left upper arm: Secondary | ICD-10-CM | POA: Insufficient documentation

## 2021-07-03 DIAGNOSIS — Z79899 Other long term (current) drug therapy: Secondary | ICD-10-CM | POA: Diagnosis not present

## 2021-07-03 DIAGNOSIS — R079 Chest pain, unspecified: Secondary | ICD-10-CM | POA: Diagnosis not present

## 2021-07-03 DIAGNOSIS — R0789 Other chest pain: Secondary | ICD-10-CM | POA: Diagnosis not present

## 2021-07-03 DIAGNOSIS — Z743 Need for continuous supervision: Secondary | ICD-10-CM | POA: Diagnosis not present

## 2021-07-03 DIAGNOSIS — R6889 Other general symptoms and signs: Secondary | ICD-10-CM | POA: Diagnosis not present

## 2021-07-03 LAB — CBC
HCT: 40.8 % (ref 39.0–52.0)
Hemoglobin: 14.2 g/dL (ref 13.0–17.0)
MCH: 30.2 pg (ref 26.0–34.0)
MCHC: 34.8 g/dL (ref 30.0–36.0)
MCV: 86.8 fL (ref 80.0–100.0)
Platelets: 169 10*3/uL (ref 150–400)
RBC: 4.7 MIL/uL (ref 4.22–5.81)
RDW: 13.5 % (ref 11.5–15.5)
WBC: 5.3 10*3/uL (ref 4.0–10.5)
nRBC: 0 % (ref 0.0–0.2)

## 2021-07-03 LAB — BASIC METABOLIC PANEL
Anion gap: 7 (ref 5–15)
BUN: 23 mg/dL (ref 8–23)
CO2: 26 mmol/L (ref 22–32)
Calcium: 8.6 mg/dL — ABNORMAL LOW (ref 8.9–10.3)
Chloride: 102 mmol/L (ref 98–111)
Creatinine, Ser: 1.47 mg/dL — ABNORMAL HIGH (ref 0.61–1.24)
GFR, Estimated: 51 mL/min — ABNORMAL LOW (ref >60)
Glucose, Bld: 321 mg/dL — ABNORMAL HIGH (ref 70–99)
Potassium: 4.4 mmol/L (ref 3.5–5.1)
Sodium: 135 mmol/L (ref 135–145)

## 2021-07-03 LAB — CBG MONITORING, ED: Glucose-Capillary: 266 mg/dL — ABNORMAL HIGH (ref 70–99)

## 2021-07-03 MED ORDER — ASPIRIN 325 MG PO TABS
325.0000 mg | ORAL_TABLET | Freq: Once | ORAL | Status: AC
  Administered 2021-07-03: 325 mg via ORAL
  Filled 2021-07-03: qty 1

## 2021-07-03 MED ORDER — SODIUM CHLORIDE 0.9 % IV BOLUS
500.0000 mL | Freq: Once | INTRAVENOUS | Status: AC
  Administered 2021-07-03: 500 mL via INTRAVENOUS

## 2021-07-03 NOTE — ED Triage Notes (Signed)
Patient brought in by [facility] for left leg and left arm discomfort. Patient states that he had leg weakness Friday night.

## 2021-07-03 NOTE — Discharge Instructions (Signed)
No cause of your arm and leg pain were found on your work-up today Your blood sugar was elevated Please keep taking your blood pressure medications and follow your blood sugar at home Return if you are having any worsening problems or new symptoms Recheck with your doctor on Monday

## 2021-07-03 NOTE — ED Provider Notes (Signed)
[facility] EMERGENCY DEPARTMENT Provider Note   CSN: [id number] Arrival date & time: 07/03/21  1011     History  Chief Complaint  Patient presents with   Leg Pain   Arm Pain    [name] is a 71 y.o. male.  HPI 71 year old male history of hypertension, gout, GERD, felt mental delay, diabetes presents today complaining that he has had some left arm and left leg pain over the past several days.  EMS note states weakness in his left leg Friday night but on my evaluation he denies this he denies any chest pain, fever, chills, dyspnea     Home Medications Prior to Admission medications   Medication Sig Start Date End Date Taking? Authorizing Provider  acetaminophen (TYLENOL) 500 MG tablet Take 1,000 mg by mouth every 6 (six) hours as needed. Pain    [provider]  atorvastatin (LIPITOR) 10 MG tablet Take 10 mg by mouth at bedtime. 09/21/17   [provider]  B Complex-C (B-COMPLEX WITH VITAMIN C) tablet Take 1 tablet by mouth daily.    [provider]  buPROPion (WELLBUTRIN XL) 150 MG 24 hr tablet Take 150 mg by mouth at bedtime.  08/29/12   [provider]  cetirizine (ZYRTEC) 10 MG tablet Take 10 mg by mouth daily.    [provider]  Cholecalciferol (VITAMIN D) 50 MCG (2000 UT) CAPS Take 2,000 Units by mouth daily.     [provider]  enalapril (VASOTEC) 10 MG tablet Take 10 mg by mouth daily. 03/10/20   [provider]  ferrous sulfate 325 (65 FE) MG EC tablet Take 325 mg by mouth daily with breakfast. Patient not taking: Reported on 12/22/2020    [provider]  glipiZIDE (GLUCOTROL) 5 MG tablet Take 5 mg by mouth 2 (two) times daily before a meal. 07/18/17   [provider]  labetalol (NORMODYNE) 200 MG tablet Take 400 mg by mouth 2 (two) times daily. 12/07/20   [provider]  loperamide (IMODIUM) 2 MG capsule TAKE ONE CAPSULE BY MOUTH TWO TIMES DAILY AS NEEDED FOR DIARRHEA OR LOOSE  STOOLS 05/04/21   Carlan, Chelsea L, NP  NIFEdipine (PROCARDIA XL/NIFEDICAL XL) 60 MG 24 hr tablet Take 60 mg by mouth daily. 12/07/20   [provider]  Omega-3 Fatty Acids (FISH OIL) 1000 MG CAPS Take 1,000 mg by mouth 2 (two) times daily. Afternoon & bedtime.    [provider]  omeprazole (PRILOSEC) 20 MG capsule Take 1 capsule (20 mg total) by mouth daily. 12/22/20   Carlan, Chelsea L, NP  pioglitazone (ACTOS) 30 MG tablet Take 30 mg by mouth daily. 10/28/17   [provider]  sertraline (ZOLOFT) 100 MG tablet Take 100 mg by mouth at bedtime.  05/09/15   [provider]  sodium bicarbonate 650 MG tablet Take 650 mg by mouth 2 (two) times daily. 07/18/17   [provider]  TOUJEO MAX SOLOSTAR 300 UNIT/ML SOPN Inject 20 Units into the skin daily. 12/24/18   [provider]  ULORIC 80 MG TABS Take 80 mg by mouth daily.  04/21/15   [provider]      Allergies    Penicillins, Ivp dye [iodinated contrast media], and Tetanus toxoids    Review of Systems   Review of Systems  Physical Exam Updated Vital Signs BP (!) 141/67   Pulse 76   Temp 98.5 F (36.9 C)   Resp 15   Ht 1.956 m (  $'6\' 5"'y$ )   Wt 106.6 kg   SpO2 98%   BMI 27.87 kg/m  Physical Exam Vitals and nursing note reviewed.  Constitutional:      General: He is not in acute distress.    Appearance: Normal appearance.  HENT:     Head: Normocephalic.     Right Ear: External ear normal.     Left Ear: External ear normal.     Nose: Nose normal.     Mouth/Throat:     Pharynx: Oropharynx is clear.  Eyes:     Pupils: Pupils are equal, round, and reactive to light.  Cardiovascular:     Rate and Rhythm: Normal rate and regular rhythm.     Pulses: Normal pulses.  Pulmonary:     Effort: Pulmonary effort is normal.     Breath sounds: Normal breath sounds.  Abdominal:     General: Bowel sounds are normal.     Palpations: Abdomen is soft.  Musculoskeletal:        General:  Normal range of motion.     Cervical back: Normal range of motion.  Skin:    General: Skin is warm and dry.     Capillary Refill: Capillary refill takes less than 2 seconds.  Neurological:     General: No focal deficit present.     Mental Status: He is alert and oriented to person, place, and time.     Cranial Nerves: No cranial nerve deficit.     Sensory: No sensory deficit.     Motor: No weakness.     Coordination: Coordination normal.     Gait: Gait normal.     Deep Tendon Reflexes: Reflexes normal.  Psychiatric:        Mood and Affect: Mood normal.        Behavior: Behavior normal.    ED Results / Procedures / Treatments   Labs (all labs ordered are listed, but only abnormal results are displayed) Labs Reviewed  BASIC METABOLIC PANEL - Abnormal; Notable for the following components:      Result Value   Glucose, Bld 321 (*)    Creatinine, Ser 1.47 (*)    Calcium 8.6 (*)    GFR, Estimated 51 (*)    All other components within normal limits  CBG MONITORING, ED - Abnormal; Notable for the following components:   Glucose-Capillary 266 (*)    All other components within normal limits  CBC    EKG EKG Interpretation  Date/Time:  Saturday Jul 03 2021 11:08:24 EDT Ventricular Rate:  82 PR Interval:  143 QRS Duration: 109 QT Interval:  381 QTC Calculation: 445 R Axis:   -23 Text Interpretation: Sinus rhythm Abnormal R-wave progression, early transition Left ventricular hypertrophy Borderline T abnormalities, inferior leads Confirmed by Pattricia Boss [phone]) on 07/03/2021 11:13:46 AM  Radiology DG Chest Port 1 View  Result Date: 07/03/2021 CLINICAL DATA:  Pain left arm EXAM: PORTABLE CHEST 1 VIEW COMPARISON:  06/02/2010 FINDINGS: The heart size and mediastinal contours are within normal limits. Both lungs are clear. The visualized skeletal structures are unremarkable. IMPRESSION: No active disease. Electronically Signed   By: Elmer Picker M.D.   On: 07/03/2021 10:54     Procedures Procedures    Medications Ordered in ED Medications  aspirin tablet 325 mg (325 mg Oral Given 07/03/21 1123)  sodium chloride 0.9 % bolus 500 mL (500 mLs Intravenous New Bag/Given 07/03/21 1126)    ED Course/ Medical Decision Making/ A&P Clinical Course as of 07/03/21  1311  Sat Jul 03, 2021  8527 Basic metabolic panel reviewed and interpreted and slightly elevated creatinine at 1.47 however stable from prior Blood sugar elevated 321 CO2 is 26 and sodium is normal No evidence of DKA CBC reviewed interpreted within normal limits [DR]  1208 Chest x-Orine Goga reviewed and interpreted and within normal limits and radiologist interpretation concurs [DR]  1307 Initial blood sugar is elevated at 321 but no evidence of DKA and after fluids blood sugar is down to 266 [DR]    Clinical Course User Index [DR] Pattricia Boss, MD                           Medical Decision Making 71 year old male with history of developmental delay presents today complaining of nonspecific pain in his left arm and left leg.  He has not had any trauma to this area. Differential diagnosis includes but is not limited to trauma, other injuries, vascular etiologies, infection, rhabdomyolysis On visual exam there is no evidence of abnormality such as trauma, deformity, or cellulitis On physical exam pulses are intact and neurologically patient is intact with good strength Patient had labs checked here as noted in work-up with hyper glycemia.  Patient is known diabetic. He is treated here with fluids and blood sugar is coming down.  We have discussed the need to continue his home medications.  I do not see any evidence of rhabdomyolysis. Otherwise patient appears to be stable and is discharged in improved condition.  Amount and/or Complexity of Data Reviewed Labs: ordered. Decision-making details documented in ED Course. Radiology: ordered and independent interpretation performed. Decision-making details  documented in ED Course. ECG/medicine tests: ordered and independent interpretation performed. Decision-making details documented in ED Course.  Risk OTC drugs.           Final Clinical Impression(s) / ED Diagnoses Final diagnoses:  Pain of left upper extremity    Rx / DC Orders ED Discharge Orders     None         Pattricia Boss, MD 07/03/21 1311

## 2021-07-03 NOTE — ED Notes (Signed)
Patient does not have a legal guardian.  [place] contacted and stated that the patient does not have a legal guardian.

## 2021-07-15 DIAGNOSIS — I10 Essential (primary) hypertension: Secondary | ICD-10-CM | POA: Diagnosis not present

## 2021-07-15 DIAGNOSIS — E109 Type 1 diabetes mellitus without complications: Secondary | ICD-10-CM | POA: Diagnosis not present

## 2021-07-15 DIAGNOSIS — E785 Hyperlipidemia, unspecified: Secondary | ICD-10-CM | POA: Diagnosis not present

## 2021-07-15 DIAGNOSIS — E559 Vitamin D deficiency, unspecified: Secondary | ICD-10-CM | POA: Diagnosis not present

## 2021-07-15 DIAGNOSIS — E1165 Type 2 diabetes mellitus with hyperglycemia: Secondary | ICD-10-CM | POA: Diagnosis not present

## 2021-07-15 DIAGNOSIS — Z0001 Encounter for general adult medical examination with abnormal findings: Secondary | ICD-10-CM | POA: Diagnosis not present

## 2021-08-06 DIAGNOSIS — N189 Chronic kidney disease, unspecified: Secondary | ICD-10-CM | POA: Diagnosis not present

## 2021-08-06 DIAGNOSIS — I129 Hypertensive chronic kidney disease with stage 1 through stage 4 chronic kidney disease, or unspecified chronic kidney disease: Secondary | ICD-10-CM | POA: Diagnosis not present

## 2021-08-06 DIAGNOSIS — E1122 Type 2 diabetes mellitus with diabetic chronic kidney disease: Secondary | ICD-10-CM | POA: Diagnosis not present

## 2021-08-06 DIAGNOSIS — E1129 Type 2 diabetes mellitus with other diabetic kidney complication: Secondary | ICD-10-CM | POA: Diagnosis not present

## 2021-08-06 DIAGNOSIS — R809 Proteinuria, unspecified: Secondary | ICD-10-CM | POA: Diagnosis not present

## 2021-08-11 DIAGNOSIS — R809 Proteinuria, unspecified: Secondary | ICD-10-CM | POA: Diagnosis not present

## 2021-08-11 DIAGNOSIS — E1129 Type 2 diabetes mellitus with other diabetic kidney complication: Secondary | ICD-10-CM | POA: Diagnosis not present

## 2021-08-11 DIAGNOSIS — E611 Iron deficiency: Secondary | ICD-10-CM | POA: Diagnosis not present

## 2021-08-11 DIAGNOSIS — N189 Chronic kidney disease, unspecified: Secondary | ICD-10-CM | POA: Diagnosis not present

## 2021-08-11 DIAGNOSIS — E1122 Type 2 diabetes mellitus with diabetic chronic kidney disease: Secondary | ICD-10-CM | POA: Diagnosis not present

## 2021-08-11 DIAGNOSIS — I160 Hypertensive urgency: Secondary | ICD-10-CM | POA: Diagnosis not present

## 2021-08-11 DIAGNOSIS — E871 Hypo-osmolality and hyponatremia: Secondary | ICD-10-CM | POA: Diagnosis not present

## 2021-08-13 DIAGNOSIS — R69 Illness, unspecified: Secondary | ICD-10-CM | POA: Diagnosis not present

## 2021-08-13 DIAGNOSIS — R5381 Other malaise: Secondary | ICD-10-CM | POA: Diagnosis not present

## 2021-08-18 DIAGNOSIS — R079 Chest pain, unspecified: Secondary | ICD-10-CM | POA: Diagnosis not present

## 2021-08-18 DIAGNOSIS — R42 Dizziness and giddiness: Secondary | ICD-10-CM | POA: Diagnosis not present

## 2021-08-18 DIAGNOSIS — R000 Tachycardia, unspecified: Secondary | ICD-10-CM | POA: Diagnosis not present

## 2021-09-02 ENCOUNTER — Other Ambulatory Visit (INDEPENDENT_AMBULATORY_CARE_PROVIDER_SITE_OTHER): Payer: Self-pay | Admitting: Gastroenterology

## 2021-09-02 DIAGNOSIS — R197 Diarrhea, unspecified: Secondary | ICD-10-CM

## 2021-09-02 NOTE — Telephone Encounter (Signed)
Last seen 12/22/20

## 2021-09-19 DIAGNOSIS — R0902 Hypoxemia: Secondary | ICD-10-CM | POA: Diagnosis not present

## 2021-10-05 ENCOUNTER — Other Ambulatory Visit: Payer: Self-pay | Admitting: *Deleted

## 2021-10-05 NOTE — Patient Outreach (Signed)
  Care Coordination   Initial Visit Note   10/05/2021 Name: [name] MRN: [id number] DOB: Jul 21, 1950  [name] is a 71 y.o. year old male who sees Redmond School, MD for primary care. I spoke with  Melanie Crazier Baba by phone today  What matters to the patients health and wellness today?  Assessment not completed, asked to call cousin Stanton Kidney (DPR on file).  Call to [facility] was unsuccessful, will follow up within the next week.   SDOH assessments and interventions completed:  No     Care Coordination Interventions Activated:  No  Care Coordination Interventions:  No, not indicated   Follow up plan: Follow up call scheduled for 8/28    Encounter Outcome:  Pt. Visit Completed   Valente David, RN, MSN, [place] Coordinator [phone]

## 2021-10-11 ENCOUNTER — Other Ambulatory Visit: Payer: Self-pay | Admitting: *Deleted

## 2021-10-11 NOTE — Patient Outreach (Signed)
  Care Coordination   10/11/2021 Name: [name] MRN: [id number] DOB: 1951-02-09   Care Coordination Outreach Attempts:  A second unsuccessful outreach was attempted today to offer the patient with information about available care coordination services as a benefit of their health plan.     Follow Up Plan:  Additional outreach attempts will be made to offer the patient care coordination information and services.   Encounter Outcome:  No Answer  Care Coordination Interventions Activated:  No   Care Coordination Interventions:  No, not indicated    Valente David, RN, MSN, [facility] Care Management Care Management Coordinator [phone]

## 2021-10-14 ENCOUNTER — Other Ambulatory Visit: Payer: Self-pay | Admitting: *Deleted

## 2021-10-14 NOTE — Patient Outreach (Signed)
  Care Coordination   10/14/2021 Name: [name] MRN: [id number] DOB: Jun 17, 1950   Care Coordination Outreach Attempts:  A third unsuccessful outreach was attempted today to offer the patient with information about available care coordination services as a benefit of their health plan.   Follow Up Plan:  No further outreach attempts will be made at this time. We have been unable to contact the patient to offer or enroll patient in care coordination services  Encounter Outcome:  No Answer  Care Coordination Interventions Activated:  No   Care Coordination Interventions:  No, not indicated    Valente David, RN, MSN, [facility] Care Management Care Management Coordinator [phone]

## 2021-11-01 DIAGNOSIS — E1122 Type 2 diabetes mellitus with diabetic chronic kidney disease: Secondary | ICD-10-CM | POA: Diagnosis not present

## 2021-11-01 DIAGNOSIS — E1129 Type 2 diabetes mellitus with other diabetic kidney complication: Secondary | ICD-10-CM | POA: Diagnosis not present

## 2021-11-01 DIAGNOSIS — E611 Iron deficiency: Secondary | ICD-10-CM | POA: Diagnosis not present

## 2021-11-01 DIAGNOSIS — N189 Chronic kidney disease, unspecified: Secondary | ICD-10-CM | POA: Diagnosis not present

## 2021-11-01 DIAGNOSIS — R809 Proteinuria, unspecified: Secondary | ICD-10-CM | POA: Diagnosis not present

## 2021-11-11 ENCOUNTER — Other Ambulatory Visit: Payer: Self-pay

## 2021-11-11 ENCOUNTER — Emergency Department (HOSPITAL_COMMUNITY): Payer: Medicare Other

## 2021-11-11 ENCOUNTER — Encounter (HOSPITAL_COMMUNITY): Payer: Self-pay

## 2021-11-11 ENCOUNTER — Inpatient Hospital Stay (HOSPITAL_COMMUNITY)
Admission: EM | Admit: 2021-11-11 | Discharge: 2021-11-14 | DRG: 872 | Disposition: A | Discharge status: Home / Self Care | Payer: Medicare Other | Attending: Family Medicine | Admitting: Family Medicine

## 2021-11-11 DIAGNOSIS — A419 Sepsis, unspecified organism: Principal | ICD-10-CM | POA: Diagnosis present

## 2021-11-11 DIAGNOSIS — R6250 Unspecified lack of expected normal physiological development in childhood: Secondary | ICD-10-CM | POA: Diagnosis not present

## 2021-11-11 DIAGNOSIS — Z91041 Radiographic dye allergy status: Secondary | ICD-10-CM | POA: Diagnosis not present

## 2021-11-11 DIAGNOSIS — N179 Acute kidney failure, unspecified: Secondary | ICD-10-CM | POA: Diagnosis not present

## 2021-11-11 DIAGNOSIS — D696 Thrombocytopenia, unspecified: Secondary | ICD-10-CM | POA: Diagnosis not present

## 2021-11-11 DIAGNOSIS — F419 Anxiety disorder, unspecified: Secondary | ICD-10-CM | POA: Diagnosis present

## 2021-11-11 DIAGNOSIS — E1169 Type 2 diabetes mellitus with other specified complication: Secondary | ICD-10-CM | POA: Diagnosis not present

## 2021-11-11 DIAGNOSIS — E1129 Type 2 diabetes mellitus with other diabetic kidney complication: Secondary | ICD-10-CM | POA: Diagnosis not present

## 2021-11-11 DIAGNOSIS — F32A Depression, unspecified: Secondary | ICD-10-CM | POA: Diagnosis not present

## 2021-11-11 DIAGNOSIS — E11628 Type 2 diabetes mellitus with other skin complications: Secondary | ICD-10-CM | POA: Diagnosis not present

## 2021-11-11 DIAGNOSIS — M7989 Other specified soft tissue disorders: Secondary | ICD-10-CM | POA: Diagnosis not present

## 2021-11-11 DIAGNOSIS — L03115 Cellulitis of right lower limb: Secondary | ICD-10-CM | POA: Diagnosis present

## 2021-11-11 DIAGNOSIS — I10 Essential (primary) hypertension: Secondary | ICD-10-CM | POA: Diagnosis present

## 2021-11-11 DIAGNOSIS — Z23 Encounter for immunization: Secondary | ICD-10-CM | POA: Diagnosis not present

## 2021-11-11 DIAGNOSIS — Z794 Long term (current) use of insulin: Secondary | ICD-10-CM

## 2021-11-11 DIAGNOSIS — Z20822 Contact with and (suspected) exposure to covid-19: Secondary | ICD-10-CM | POA: Diagnosis not present

## 2021-11-11 DIAGNOSIS — E1121 Type 2 diabetes mellitus with diabetic nephropathy: Secondary | ICD-10-CM

## 2021-11-11 DIAGNOSIS — K219 Gastro-esophageal reflux disease without esophagitis: Secondary | ICD-10-CM | POA: Diagnosis not present

## 2021-11-11 DIAGNOSIS — Z89411 Acquired absence of right great toe: Secondary | ICD-10-CM | POA: Diagnosis not present

## 2021-11-11 DIAGNOSIS — N189 Chronic kidney disease, unspecified: Secondary | ICD-10-CM | POA: Diagnosis not present

## 2021-11-11 DIAGNOSIS — M19072 Primary osteoarthritis, left ankle and foot: Secondary | ICD-10-CM | POA: Diagnosis not present

## 2021-11-11 DIAGNOSIS — Z79899 Other long term (current) drug therapy: Secondary | ICD-10-CM

## 2021-11-11 DIAGNOSIS — E1122 Type 2 diabetes mellitus with diabetic chronic kidney disease: Secondary | ICD-10-CM | POA: Diagnosis not present

## 2021-11-11 DIAGNOSIS — E7800 Pure hypercholesterolemia, unspecified: Secondary | ICD-10-CM | POA: Diagnosis not present

## 2021-11-11 DIAGNOSIS — E871 Hypo-osmolality and hyponatremia: Secondary | ICD-10-CM | POA: Diagnosis not present

## 2021-11-11 DIAGNOSIS — Z880 Allergy status to penicillin: Secondary | ICD-10-CM | POA: Diagnosis not present

## 2021-11-11 DIAGNOSIS — L03032 Cellulitis of left toe: Secondary | ICD-10-CM | POA: Diagnosis not present

## 2021-11-11 DIAGNOSIS — N183 Chronic kidney disease, stage 3 unspecified: Secondary | ICD-10-CM | POA: Diagnosis not present

## 2021-11-11 DIAGNOSIS — R6520 Severe sepsis without septic shock: Secondary | ICD-10-CM | POA: Diagnosis not present

## 2021-11-11 DIAGNOSIS — Z888 Allergy status to other drugs, medicaments and biological substances status: Secondary | ICD-10-CM

## 2021-11-11 DIAGNOSIS — S92422A Displaced fracture of distal phalanx of left great toe, initial encounter for closed fracture: Secondary | ICD-10-CM | POA: Diagnosis not present

## 2021-11-11 DIAGNOSIS — E119 Type 2 diabetes mellitus without complications: Secondary | ICD-10-CM

## 2021-11-11 DIAGNOSIS — R000 Tachycardia, unspecified: Secondary | ICD-10-CM | POA: Diagnosis not present

## 2021-11-11 DIAGNOSIS — L03119 Cellulitis of unspecified part of limb: Secondary | ICD-10-CM | POA: Diagnosis not present

## 2021-11-11 DIAGNOSIS — R079 Chest pain, unspecified: Secondary | ICD-10-CM | POA: Diagnosis not present

## 2021-11-11 DIAGNOSIS — I129 Hypertensive chronic kidney disease with stage 1 through stage 4 chronic kidney disease, or unspecified chronic kidney disease: Secondary | ICD-10-CM | POA: Diagnosis not present

## 2021-11-11 DIAGNOSIS — D508 Other iron deficiency anemias: Secondary | ICD-10-CM | POA: Diagnosis not present

## 2021-11-11 DIAGNOSIS — R809 Proteinuria, unspecified: Secondary | ICD-10-CM | POA: Diagnosis not present

## 2021-11-11 LAB — CBC
HCT: 39.2 % (ref 39.0–52.0)
Hemoglobin: 12.9 g/dL — ABNORMAL LOW (ref 13.0–17.0)
MCH: 29.5 pg (ref 26.0–34.0)
MCHC: 32.9 g/dL (ref 30.0–36.0)
MCV: 89.5 fL (ref 80.0–100.0)
Platelets: 292 10*3/uL (ref 150–400)
RBC: 4.38 MIL/uL (ref 4.22–5.81)
RDW: 12.9 % (ref 11.5–15.5)
WBC: 8.3 10*3/uL (ref 4.0–10.5)
nRBC: 0 % (ref 0.0–0.2)

## 2021-11-11 LAB — TROPONIN I (HIGH SENSITIVITY): Troponin I (High Sensitivity): 3 ng/L (ref <18)

## 2021-11-11 LAB — BASIC METABOLIC PANEL
Anion gap: 14 (ref 5–15)
BUN: 27 mg/dL — ABNORMAL HIGH (ref 8–23)
CO2: 24 mmol/L (ref 22–32)
Calcium: 9.5 mg/dL (ref 8.9–10.3)
Chloride: 100 mmol/L (ref 98–111)
Creatinine, Ser: 1.9 mg/dL — ABNORMAL HIGH (ref 0.61–1.24)
GFR, Estimated: 37 mL/min — ABNORMAL LOW (ref >60)
Glucose, Bld: 201 mg/dL — ABNORMAL HIGH (ref 70–99)
Potassium: 5.1 mmol/L (ref 3.5–5.1)
Sodium: 138 mmol/L (ref 135–145)

## 2021-11-11 LAB — LACTIC ACID, PLASMA
Lactic Acid, Venous: 1.7 mmol/L (ref 0.5–1.9)
Lactic Acid, Venous: 2.4 mmol/L (ref 0.5–1.9)

## 2021-11-11 LAB — PROTIME-INR
INR: 1.1 (ref 0.8–1.2)
Prothrombin Time: 14.4 seconds (ref 11.4–15.2)

## 2021-11-11 LAB — RESP PANEL BY RT-PCR (FLU A&B, COVID) ARPGX2
Influenza A by PCR: NEGATIVE
Influenza B by PCR: NEGATIVE
SARS Coronavirus 2 by RT PCR: NEGATIVE

## 2021-11-11 LAB — APTT: aPTT: 34 seconds (ref 24–36)

## 2021-11-11 LAB — TSH: TSH: 0.916 u[IU]/mL (ref 0.350–4.500)

## 2021-11-11 MED ORDER — LACTATED RINGERS IV BOLUS (SEPSIS)
1000.0000 mL | Freq: Once | INTRAVENOUS | Status: AC
  Administered 2021-11-11: 1000 mL via INTRAVENOUS

## 2021-11-11 MED ORDER — SODIUM CHLORIDE 0.9 % IV SOLN
2.0000 g | Freq: Three times a day (TID) | INTRAVENOUS | Status: AC
  Administered 2021-11-12 – 2021-11-13 (×5): 2 g via INTRAVENOUS
  Filled 2021-11-11 (×8): qty 10

## 2021-11-11 MED ORDER — ACETAMINOPHEN 500 MG PO TABS
1000.0000 mg | ORAL_TABLET | Freq: Once | ORAL | Status: AC
  Administered 2021-11-11: 1000 mg via ORAL
  Filled 2021-11-11: qty 2

## 2021-11-11 MED ORDER — VANCOMYCIN HCL IN DEXTROSE 1-5 GM/200ML-% IV SOLN: 1000.0000 mg | Freq: Once | INTRAVENOUS | Status: DC

## 2021-11-11 MED ORDER — METRONIDAZOLE 500 MG/100ML IV SOLN
500.0000 mg | Freq: Once | INTRAVENOUS | Status: AC
  Administered 2021-11-11: 500 mg via INTRAVENOUS
  Filled 2021-11-11: qty 100

## 2021-11-11 MED ORDER — VANCOMYCIN HCL 1500 MG/300ML IV SOLN
1500.0000 mg | INTRAVENOUS | Status: DC
  Administered 2021-11-12 (×2): 1500 mg via INTRAVENOUS
  Filled 2021-11-11 (×2): qty 300

## 2021-11-11 MED ORDER — LACTATED RINGERS IV SOLN: INTRAVENOUS | Status: DC

## 2021-11-11 MED ORDER — AZTREONAM 2 G IJ SOLR
2.0000 g | Freq: Once | INTRAMUSCULAR | Status: AC
  Administered 2021-11-11: 2 g via INTRAVENOUS
  Filled 2021-11-11: qty 10

## 2021-11-11 NOTE — ED Notes (Signed)
Phlebotomist at bedside for blood draw, 1st blood culture drawn by lab, this nurse will obtain 2nd set with PIV access

## 2021-11-11 NOTE — ED Notes (Signed)
Pt only complaint is dizziness intermittently and headache

## 2021-11-11 NOTE — ED Triage Notes (Signed)
Pt HR 130's at Dr [name] office. Denies CP or SOB.

## 2021-11-11 NOTE — ED Notes (Signed)
Pt had large BM- bedside toilet in room at this time

## 2021-11-11 NOTE — ED Notes (Signed)
CRITICAL VALUE STICKER  CRITICAL VALUE: Lactic 2.4  RECEIVER (on-site recipient of call): Verlene Mayer RN  DATE & TIME NOTIFIED: 11/11/21  2326  MESSENGER (representative from lab): Delana Meyer   MD NOTIFIED: Dr. Kathrynn Humble  TIME OF NOTIFICATION: 2327  RESPONSE:

## 2021-11-11 NOTE — ED Provider Notes (Signed)
[facility] EMERGENCY DEPARTMENT Provider Note   CSN: [id number] Arrival date & time: 11/11/21  1616     History  Chief Complaint  Patient presents with   Tachycardia    [name] is a 71 y.o. male.  With past medical history of diabetes, GERD, hypertension, previous osteomyelitis who presents to the emergency department with tachycardia.  History is provided by cousin at bedside as the patient has developmental delay.  She states that she took patient to his follow-up appointment today with Dr. [name], nephrology.  She states that usually his blood pressure is "the issue," however at the appointment his heart rate was in the 130s.  She states that Dr. [name] called here to the emergency department and wanted him to be evaluated.  She states that earlier today prior to going to the nephrology appointment the patient was complaining of having a "swimmy headache."  The patient states that he did feel cold last night and has felt lightheaded for about 1 week.  He denies having any palpitations, chest pain, shortness of breath, palpitations, dysuria, cough.  HPI     Home Medications Prior to Admission medications   Medication Sig Start Date End Date Taking? Authorizing Provider  acetaminophen (TYLENOL) 500 MG tablet Take 1,000 mg by mouth every 6 (six) hours as needed. Pain    [provider]  atorvastatin (LIPITOR) 10 MG tablet Take 10 mg by mouth at bedtime. 09/21/17   [provider]  B Complex-C (B-COMPLEX WITH VITAMIN C) tablet Take 1 tablet by mouth daily.    [provider]  buPROPion (WELLBUTRIN XL) 150 MG 24 hr tablet Take 150 mg by mouth at bedtime.  08/29/12   [provider]  cetirizine (ZYRTEC) 10 MG tablet Take 10 mg by mouth daily.    [provider]  Cholecalciferol (VITAMIN D) 50 MCG (2000 UT) CAPS Take 2,000 Units by mouth daily.     [provider]  enalapril (VASOTEC) 10 MG tablet Take 10 mg by mouth daily.  03/10/20   [provider]  ferrous sulfate 325 (65 FE) MG EC tablet Take 325 mg by mouth daily with breakfast. Patient not taking: Reported on 12/22/2020    [provider]  glipiZIDE (GLUCOTROL) 5 MG tablet Take 5 mg by mouth 2 (two) times daily before a meal. 07/18/17   [provider]  labetalol (NORMODYNE) 200 MG tablet Take 400 mg by mouth 2 (two) times daily. 12/07/20   [provider]  loperamide (IMODIUM) 2 MG capsule TAKE ONE CAPSULE BY MOUTH TWO TIMES DAILY AS NEEDED FOR DIARRHEA OR LOOSE STOOLS 09/02/21   Carlan, Chelsea L, NP  NIFEdipine (PROCARDIA XL/NIFEDICAL XL) 60 MG 24 hr tablet Take 60 mg by mouth daily. 12/07/20   [provider]  Omega-3 Fatty Acids (FISH OIL) 1000 MG CAPS Take 1,000 mg by mouth 2 (two) times daily. Afternoon & bedtime.    [provider]  omeprazole (PRILOSEC) 20 MG capsule Take 1 capsule (20 mg total) by mouth daily. 12/22/20   Carlan, Chelsea L, NP  pioglitazone (ACTOS) 30 MG tablet Take 30 mg by mouth daily. 10/28/17   [provider]  sertraline (ZOLOFT) 100 MG tablet Take 100 mg by mouth at bedtime.  05/09/15   [provider]  sodium bicarbonate 650 MG tablet Take 650 mg by mouth 2 (two) times daily. 07/18/17   [provider]  TOUJEO MAX SOLOSTAR 300 UNIT/ML SOPN Inject 20 Units into the skin  daily. 12/24/18   [provider]  ULORIC 80 MG TABS Take 80 mg by mouth daily.  04/21/15   [provider]      Allergies    Penicillins, Ivp dye [iodinated contrast media], and Tetanus toxoids    Review of Systems   Review of Systems  Constitutional:  Positive for chills. Negative for fever.  Respiratory:  Negative for cough and shortness of breath.   Cardiovascular:  Negative for chest pain.  Gastrointestinal:  Negative for abdominal pain and nausea.  Genitourinary:  Negative for dysuria.  Neurological:  Positive for headaches.  All other systems reviewed and are  negative.   Physical Exam Updated Vital Signs BP (!) 158/73   Pulse (!) 110   Temp 98.7 F (37.1 C) (Oral)   Resp (!) 26   Ht _0  (1.88 m)   Wt 95.9 kg   SpO2 98%   BMI 27.15 kg/m  Physical Exam Vitals and nursing note reviewed.  Constitutional:      General: He is not in acute distress.    Appearance: Normal appearance. He is ill-appearing. He is not toxic-appearing.  HENT:     Head: Normocephalic.     Mouth/Throat:     Mouth: Mucous membranes are dry.     Pharynx: Oropharynx is clear.  Eyes:     General: No scleral icterus.    Extraocular Movements: Extraocular movements intact.     Pupils: Pupils are equal, round, and reactive to light.  Cardiovascular:     Rate and Rhythm: Regular rhythm. Tachycardia present.     Pulses: Normal pulses.     Heart sounds: No murmur heard. Pulmonary:     Effort: Pulmonary effort is normal. No respiratory distress.     Breath sounds: Normal breath sounds.  Abdominal:     General: Bowel sounds are normal. There is no distension.     Palpations: Abdomen is soft.     Tenderness: There is no abdominal tenderness.  Musculoskeletal:        General: Swelling present.     Cervical back: Neck supple.  Skin:    General: Skin is warm and dry.     Capillary Refill: Capillary refill takes less than 2 seconds.     Findings: Erythema present.     Comments: Erythema and swelling to the left great toe Chronic diabetic ulcer to right plantar surface  Patient hot to palpation  Neurological:     General: No focal deficit present.     Mental Status: He is alert. Mental status is at baseline.  Psychiatric:        Mood and Affect: Mood normal.        Behavior: Behavior normal.    ED Results / Procedures / Treatments   Labs (all labs ordered are listed, but only abnormal results are displayed) Labs Reviewed  BASIC METABOLIC PANEL - Abnormal; Notable for the following components:      Result Value   Glucose, Bld 201 (*)    BUN 27 (*)     Creatinine, Ser 1.90 (*)    GFR, Estimated 37 (*)    All other components within normal limits  CBC - Abnormal; Notable for the following components:   Hemoglobin 12.9 (*)    All other components within normal limits  RESP PANEL BY RT-PCR (FLU A&B, COVID) ARPGX2  CULTURE, BLOOD (ROUTINE X 2)  CULTURE, BLOOD (ROUTINE X 2)  URINE CULTURE  LACTIC ACID, PLASMA  PROTIME-INR  APTT  TSH  LACTIC ACID, PLASMA  URINALYSIS, ROUTINE W REFLEX MICROSCOPIC  SEDIMENTATION RATE  C-REACTIVE PROTEIN  TROPONIN I (HIGH SENSITIVITY)  TROPONIN I (HIGH SENSITIVITY)    EKG EKG Interpretation  Date/Time:  Thursday November 11 2021 20:18:57 EDT Ventricular Rate:  121 PR Interval:  130 QRS Duration: 100 QT Interval:  305 QTC Calculation: 433 R Axis:   -16 Text Interpretation: Sinus tachycardia Borderline left axis deviation Borderline repolarization abnormality No acute changes No significant change since last tracing Confirmed by Varney Biles [phone]) on 11/11/2021 10:09:57 PM  Radiology DG Toe Great Left  Result Date: 11/11/2021 CLINICAL DATA:  Osteomyelitis EXAM: LEFT GREAT TOE COMPARISON:  None Available. FINDINGS: There is a minimally displaced anatomically aligned fracture of the distal tuft of the left great toe. No superimposed osseous erosions are identified. No abnormal periosteal reaction. 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 There is extensive soft tissue swelling of the great toe. Vascular calcifications are noted within the visualized left forefoot. IMPRESSION: Minimally displaced anatomically aligned fracture of the distal tuft of the left great toe. Extensive soft tissue swelling of the left great toe. No superimposed osseous erosion. Electronically Signed   By: Fidela Salisbury M.D.   On: 11/11/2021 21:15   DG Chest 2 View  Result Date: 11/11/2021 CLINICAL DATA:  Chest pain EXAM: CHEST - 2 VIEW COMPARISON:  Chest x-ray 07/03/2021 FINDINGS:  The heart size and mediastinal contours are within normal limits. Both lungs are clear. The visualized skeletal structures are unremarkable. IMPRESSION: No active cardiopulmonary disease. Electronically Signed   By: Ronney Asters M.D.   On: 11/11/2021 16:48    Procedures .Critical Care  Performed by: Mickie Hillier, PA-C Authorized by: Mickie Hillier, PA-C   Critical care provider statement:    Critical care time (minutes):  40   Critical care time was exclusive of:  Separately billable procedures and treating other patients   Critical care was necessary to treat or prevent imminent or life-threatening deterioration of the following conditions:  Sepsis   Critical care was time spent personally by me on the following activities:  Development of treatment plan with patient or surrogate, discussions with consultants, discussions with primary provider, evaluation of patient's response to treatment, examination of patient, interpretation of cardiac output measurements, obtaining history from patient or surrogate, review of old charts, ordering and review of radiographic studies, re-evaluation of patient's condition, pulse oximetry, ordering and review of laboratory studies and ordering and performing treatments and interventions   I assumed direction of critical care for this patient from another provider in my specialty: no     Care discussed with: admitting provider      Medications Ordered in ED Medications  lactated ringers infusion (0 mLs Intravenous Hold 11/11/21 2158)  metroNIDAZOLE (FLAGYL) IVPB 500 mg (500 mg Intravenous New Bag/Given 11/11/21 2257)  vancomycin (VANCOREADY) IVPB 1500 mg/300 mL (has no administration in time range)  aztreonam (AZACTAM) 2 g in sodium chloride 0.9 % 100 mL IVPB (has no administration in time range)  lactated ringers bolus 1,000 mL (0 mLs Intravenous Stopped 11/11/21 2222)    And  lactated ringers bolus 1,000 mL (1,000 mLs Intravenous New Bag/Given 11/11/21  2156)    And  lactated ringers bolus 1,000 mL (1,000 mLs Intravenous New Bag/Given 11/11/21 2100)  aztreonam (AZACTAM) 2 g in sodium chloride 0.9 % 100 mL IVPB (0 g Intravenous Stopped 11/11/21 2255)  acetaminophen (TYLENOL) tablet 1,000 mg (1,000 mg Oral Given  11/11/21 2125)    ED Course/ Medical Decision Making/ A&P Clinical Course as of 11/11/21 2318  Thu Nov 11, 2021  2006 Assessed at bedside. Initial labs without fever however patient feels hot to touch. Will have nursing recheck. Tachy. L great toe with redness - will image.  [LA]  2030 Repeat temp 102.6. Initiating sepsis work up, IVF, broad abx for unknown cause. CXR without pna. Adding urine, imaging of red toe r/o osteo, COVID/flu. Will also add TSH although I think tachycardia may be driven by infection. Allergy to PCN so giving aztreonam, vanc, flagyl for sepsis. [LA]    Clinical Course User Index [LA] Mickie Hillier, PA-C                           Medical Decision Making Amount and/or Complexity of Data Reviewed Labs: ordered. Radiology: ordered.  Risk OTC drugs. Prescription drug management. Decision regarding hospitalization.   This patient presents to the ED with chief complaint(s) of tachycardia with pertinent past medical history of diabetes which further complicates the presenting complaint. The complaint involves an extensive differential diagnosis and treatment options and also carries with it a high risk of complications and morbidity.    The differential diagnosis includes sinus tachycardia, fever, dehydration, sepsis, pain, etc.  Additional history obtained: Additional history obtained from family Records reviewed Care Everywhere/External Records and Primary Care Documents  ED Course: Lab Tests: I Ordered, and personally interpreted labs.  The pertinent results include:   Lactic negative TSH normal Slightly increased creatinine from baseline but no AKI COVID and flu negative Urine pending Imaging  Studies: I ordered and independently visualized and interpreted the following imaging X-ray chest and left great toe   which showed no pneumonia, pleural effusion, pneumothorax or other abnormalities, left great toe without evidence of osteomyelitis The interpretation of the imaging was limited to assessing for emergent pathology, for which purpose it was ordered. Cardiac Monitoring: The patient was maintained on a cardiac monitor.  I personally viewed and interpreted the cardiac monitor which showed an underlying rhythm of:  sinus tachycardia Medicines ordered and prescription drug management: I ordered the following medications broad-spectrum antibiotics, weight-based LR bolus, Tylenol for fever  I considered this additional medications: No further medications Reevaluation of the patient after these medicines showed that the patient    improved  Reassessment and review (also see workup area):  71 year old male who presents to the emergency department with tachycardia.  On physical exam patient is hot to touch.  I had nursing recheck his temperature which was almost 103.  May be driving his tachycardia.  He does have a inflamed and swollen left great toe with history of amputation which may be source of symptoms.  He is unable to provide much history but does not grimace to abdominal palpation.  Has not complained to family about burning when he pees, cough or shortness of breath.  Sepsis labs ordered, broad-spectrum antibiotics ordered and fluid bolus.  Obtaining chest x-ray as well as plain film of the left great toe.  No leukocytosis.  Mild increase in creatinine but no AKI.  COVID flu negative.  Thyroid negative, lactic negative.  Blood cultures are pending.  Chest x-ray is negative.  Left great toe imaging is negative for osteo, however still concerned so obtaining ESR and CRP to evaluate a bit closer.  May need MRI for this. His EKG shows sinus tachycardia.  There is no arrhythmia  present.  Heart  rate is slowly decreasing with fluids, he has defervesced.  He will need admission for ongoing sepsis work-up in the setting of fever, tachycardia.  I consulted and spoke with the hospitalist who agrees to admit the patient.  Patient is agreeable with this plan.  Consultations Obtained: I requested consultation with the admitting physician Dr. Clearence Ped , and discussed  findings as well as pertinent plan - they recommend: Admission   Complexity of problems addressed: Patient's presentation is most consistent with  acute presentation with potential threat to life or bodily function During patient's assessment  Disposition: After consideration of the diagnostic results and the patient's response to treatment,  I feel that the patent would benefit from admission hospitalist .  Social Determinants of Health: Patient's  developmental delay   increases the complexity of managing their presentation  Final Clinical Impression(s) / ED Diagnoses Final diagnoses:  Sepsis without acute organ dysfunction, due to unspecified organism [facility])    Rx / DC Orders ED Discharge Orders     None         Mickie Hillier, PA-C 11/11/21 2318    Varney Biles, MD 11/12/21 1516

## 2021-11-11 NOTE — Progress Notes (Signed)
Pharmacy Antibiotic Note  [name] is a 71 y.o. male admitted on 11/11/2021 with sepsis.  Scr elevated at 1.9  Plan: Azactam 2 g q8h Vanc 1500 mg q24h - est auc 480 Monitor renal fx cx vanc lvls prn  Height: '6\' 2"'$  (188 cm) Weight: 95.9 kg (211 lb 8 oz) IBW/kg (Calculated) : 82.2  Temp (24hrs), Avg:100.7 F (38.2 C), Min:98.8 F (37.1 C), Max:102.6 F (39.2 C)  Recent Labs  Lab 11/11/21 1745  WBC 8.3  CREATININE 1.90*    Estimated Creatinine Clearance: 42.1 mL/min (A) (by C-G formula based on SCr of 1.9 mg/dL (H)).    Allergies  Allergen Reactions   Penicillins Other (See Comments)    Unknown Did it involve swelling of the face/tongue/throat, SOB, or low BP? Unknown Did it involve sudden or severe rash/hives, skin peeling, or any reaction on the inside of your mouth or nose? Unknown Did you need to seek medical attention at a hospital or doctor's office? Unknown When did it last happen?      childhood allergy If all above answers are "NO", may proceed with cephalosporin use.    Ivp Dye [Iodinated Contrast Media]     Sped heart reate up    Tetanus Toxoids Rash   Barth Kirks, PharmD, BCCCP Clinical Pharmacist  Please check AMION for all [place] numbers  11/11/2021 8:45 PM

## 2021-11-11 NOTE — Sepsis Progress Note (Signed)
Following for sepsis monitoring ?

## 2021-11-12 DIAGNOSIS — A419 Sepsis, unspecified organism: Secondary | ICD-10-CM | POA: Diagnosis not present

## 2021-11-12 DIAGNOSIS — E11628 Type 2 diabetes mellitus with other skin complications: Secondary | ICD-10-CM | POA: Diagnosis not present

## 2021-11-12 DIAGNOSIS — F419 Anxiety disorder, unspecified: Secondary | ICD-10-CM | POA: Diagnosis not present

## 2021-11-12 DIAGNOSIS — I10 Essential (primary) hypertension: Secondary | ICD-10-CM

## 2021-11-12 DIAGNOSIS — N179 Acute kidney failure, unspecified: Secondary | ICD-10-CM | POA: Diagnosis not present

## 2021-11-12 DIAGNOSIS — Z794 Long term (current) use of insulin: Secondary | ICD-10-CM

## 2021-11-12 DIAGNOSIS — L03115 Cellulitis of right lower limb: Secondary | ICD-10-CM | POA: Diagnosis present

## 2021-11-12 DIAGNOSIS — N183 Chronic kidney disease, stage 3 unspecified: Secondary | ICD-10-CM

## 2021-11-12 DIAGNOSIS — E1122 Type 2 diabetes mellitus with diabetic chronic kidney disease: Secondary | ICD-10-CM

## 2021-11-12 DIAGNOSIS — R6520 Severe sepsis without septic shock: Secondary | ICD-10-CM

## 2021-11-12 DIAGNOSIS — L03119 Cellulitis of unspecified part of limb: Secondary | ICD-10-CM

## 2021-11-12 LAB — CBC WITH DIFFERENTIAL/PLATELET
Abs Immature Granulocytes: 0.03 10*3/uL (ref 0.00–0.07)
Basophils Absolute: 0 10*3/uL (ref 0.0–0.1)
Basophils Relative: 0 %
Eosinophils Absolute: 0 10*3/uL (ref 0.0–0.5)
Eosinophils Relative: 1 %
HCT: 32.1 % — ABNORMAL LOW (ref 39.0–52.0)
Hemoglobin: 10.7 g/dL — ABNORMAL LOW (ref 13.0–17.0)
Immature Granulocytes: 1 %
Lymphocytes Relative: 11 %
Lymphs Abs: 0.5 10*3/uL — ABNORMAL LOW (ref 0.7–4.0)
MCH: 29.6 pg (ref 26.0–34.0)
MCHC: 33.3 g/dL (ref 30.0–36.0)
MCV: 88.7 fL (ref 80.0–100.0)
Monocytes Absolute: 0.5 10*3/uL (ref 0.1–1.0)
Monocytes Relative: 11 %
Neutro Abs: 3.7 10*3/uL (ref 1.7–7.7)
Neutrophils Relative %: 76 %
Platelets: 236 10*3/uL (ref 150–400)
RBC: 3.62 MIL/uL — ABNORMAL LOW (ref 4.22–5.81)
RDW: 12.8 % (ref 11.5–15.5)
WBC: 4.8 10*3/uL (ref 4.0–10.5)
nRBC: 0 % (ref 0.0–0.2)

## 2021-11-12 LAB — COMPREHENSIVE METABOLIC PANEL
ALT: 14 U/L (ref 0–44)
AST: 14 U/L — ABNORMAL LOW (ref 15–41)
Albumin: 3 g/dL — ABNORMAL LOW (ref 3.5–5.0)
Alkaline Phosphatase: 49 U/L (ref 38–126)
Anion gap: 6 (ref 5–15)
BUN: 25 mg/dL — ABNORMAL HIGH (ref 8–23)
CO2: 22 mmol/L (ref 22–32)
Calcium: 8 mg/dL — ABNORMAL LOW (ref 8.9–10.3)
Chloride: 104 mmol/L (ref 98–111)
Creatinine, Ser: 1.48 mg/dL — ABNORMAL HIGH (ref 0.61–1.24)
GFR, Estimated: 51 mL/min — ABNORMAL LOW (ref >60)
Glucose, Bld: 196 mg/dL — ABNORMAL HIGH (ref 70–99)
Potassium: 3.5 mmol/L (ref 3.5–5.1)
Sodium: 132 mmol/L — ABNORMAL LOW (ref 135–145)
Total Bilirubin: 1.4 mg/dL — ABNORMAL HIGH (ref 0.3–1.2)
Total Protein: 6.4 g/dL — ABNORMAL LOW (ref 6.5–8.1)

## 2021-11-12 LAB — URINALYSIS, ROUTINE W REFLEX MICROSCOPIC
Bacteria, UA: NONE SEEN
Bilirubin Urine: NEGATIVE
Glucose, UA: NEGATIVE mg/dL
Hgb urine dipstick: NEGATIVE
Ketones, ur: 20 mg/dL — AB
Leukocytes,Ua: NEGATIVE
Nitrite: NEGATIVE
Protein, ur: 30 mg/dL — AB
Specific Gravity, Urine: 1.013 (ref 1.005–1.030)
pH: 5 (ref 5.0–8.0)

## 2021-11-12 LAB — MAGNESIUM: Magnesium: 1.9 mg/dL (ref 1.7–2.4)

## 2021-11-12 LAB — GLUCOSE, CAPILLARY
Glucose-Capillary: 147 mg/dL — ABNORMAL HIGH (ref 70–99)
Glucose-Capillary: 258 mg/dL — ABNORMAL HIGH (ref 70–99)

## 2021-11-12 LAB — LACTIC ACID, PLASMA: Lactic Acid, Venous: 1.1 mmol/L (ref 0.5–1.9)

## 2021-11-12 LAB — MRSA NEXT GEN BY PCR, NASAL: MRSA by PCR Next Gen: NOT DETECTED

## 2021-11-12 LAB — C-REACTIVE PROTEIN: CRP: 8.8 mg/dL — ABNORMAL HIGH (ref <1.0)

## 2021-11-12 LAB — SEDIMENTATION RATE: Sed Rate: 44 mm/hr — ABNORMAL HIGH (ref 0–16)

## 2021-11-12 LAB — CBG MONITORING, ED
Glucose-Capillary: 176 mg/dL — ABNORMAL HIGH (ref 70–99)
Glucose-Capillary: 214 mg/dL — ABNORMAL HIGH (ref 70–99)

## 2021-11-12 LAB — TROPONIN I (HIGH SENSITIVITY): Troponin I (High Sensitivity): 3 ng/L (ref <18)

## 2021-11-12 LAB — HEMOGLOBIN A1C
Hgb A1c MFr Bld: 6.8 % — ABNORMAL HIGH (ref 4.8–5.6)
Mean Plasma Glucose: 148.46 mg/dL

## 2021-11-12 LAB — PROCALCITONIN: Procalcitonin: 0.57 ng/mL

## 2021-11-12 MED ORDER — LACTATED RINGERS IV SOLN: INTRAVENOUS | Status: DC

## 2021-11-12 MED ORDER — ONDANSETRON HCL 4 MG/2ML IJ SOLN: 4.0000 mg | Freq: Four times a day (QID) | INTRAMUSCULAR | Status: DC | PRN

## 2021-11-12 MED ORDER — ENALAPRIL MALEATE 10 MG PO TABS: 10.0000 mg | ORAL_TABLET | Freq: Every day | ORAL | Status: DC

## 2021-11-12 MED ORDER — ONDANSETRON HCL 4 MG PO TABS: 4.0000 mg | ORAL_TABLET | Freq: Four times a day (QID) | ORAL | Status: DC | PRN

## 2021-11-12 MED ORDER — HEPARIN SODIUM (PORCINE) 5000 UNIT/ML IJ SOLN
5000.0000 [IU] | Freq: Three times a day (TID) | INTRAMUSCULAR | Status: DC
  Administered 2021-11-12 – 2021-11-14 (×7): 5000 [IU] via SUBCUTANEOUS
  Filled 2021-11-12 (×6): qty 1

## 2021-11-12 MED ORDER — ACETAMINOPHEN 325 MG PO TABS
650.0000 mg | ORAL_TABLET | Freq: Four times a day (QID) | ORAL | Status: DC | PRN
  Administered 2021-11-12 – 2021-11-14 (×2): 650 mg via ORAL
  Filled 2021-11-12 (×2): qty 2

## 2021-11-12 MED ORDER — ATORVASTATIN CALCIUM 10 MG PO TABS
10.0000 mg | ORAL_TABLET | Freq: Every day | ORAL | Status: DC
  Administered 2021-11-12 – 2021-11-13 (×2): 10 mg via ORAL
  Filled 2021-11-12 (×2): qty 1

## 2021-11-12 MED ORDER — MORPHINE SULFATE (PF) 2 MG/ML IV SOLN: 2.0000 mg | INTRAVENOUS | Status: DC | PRN

## 2021-11-12 MED ORDER — LABETALOL HCL 200 MG PO TABS
400.0000 mg | ORAL_TABLET | Freq: Two times a day (BID) | ORAL | Status: DC
  Administered 2021-11-12 – 2021-11-14 (×5): 400 mg via ORAL
  Filled 2021-11-12 (×5): qty 2

## 2021-11-12 MED ORDER — ACETAMINOPHEN 650 MG RE SUPP: 650.0000 mg | Freq: Four times a day (QID) | RECTAL | Status: DC | PRN

## 2021-11-12 MED ORDER — OXYCODONE HCL 5 MG PO TABS: 5.0000 mg | ORAL_TABLET | ORAL | Status: DC | PRN

## 2021-11-12 MED ORDER — SERTRALINE HCL 50 MG PO TABS
100.0000 mg | ORAL_TABLET | Freq: Every day | ORAL | Status: DC
  Administered 2021-11-12 – 2021-11-13 (×2): 100 mg via ORAL
  Filled 2021-11-12 (×2): qty 2

## 2021-11-12 MED ORDER — NIFEDIPINE ER OSMOTIC RELEASE 30 MG PO TB24
60.0000 mg | ORAL_TABLET | Freq: Every day | ORAL | Status: DC
  Administered 2021-11-12 – 2021-11-14 (×3): 60 mg via ORAL
  Filled 2021-11-12 (×3): qty 2

## 2021-11-12 MED ORDER — FENTANYL CITRATE PF 50 MCG/ML IJ SOSY: 25.0000 ug | PREFILLED_SYRINGE | INTRAMUSCULAR | Status: DC | PRN

## 2021-11-12 MED ORDER — INSULIN ASPART 100 UNIT/ML IJ SOLN
0.0000 [IU] | Freq: Three times a day (TID) | INTRAMUSCULAR | Status: DC
  Administered 2021-11-12: 3 [IU] via SUBCUTANEOUS
  Administered 2021-11-12: 5 [IU] via SUBCUTANEOUS
  Administered 2021-11-12 – 2021-11-13 (×2): 2 [IU] via SUBCUTANEOUS
  Administered 2021-11-13: 3 [IU] via SUBCUTANEOUS
  Administered 2021-11-13: 5 [IU] via SUBCUTANEOUS
  Administered 2021-11-14: 3 [IU] via SUBCUTANEOUS
  Administered 2021-11-14: 2 [IU] via SUBCUTANEOUS
  Filled 2021-11-12: qty 1

## 2021-11-12 MED ORDER — INSULIN DETEMIR 100 UNIT/ML ~~LOC~~ SOLN
12.0000 [IU] | Freq: Every day | SUBCUTANEOUS | Status: DC
  Administered 2021-11-12 – 2021-11-13 (×2): 12 [IU] via SUBCUTANEOUS
  Filled 2021-11-12 (×3): qty 0.12

## 2021-11-12 MED ORDER — PANTOPRAZOLE SODIUM 40 MG PO TBEC
40.0000 mg | DELAYED_RELEASE_TABLET | Freq: Every day | ORAL | Status: DC
  Administered 2021-11-12 – 2021-11-14 (×3): 40 mg via ORAL
  Filled 2021-11-12 (×3): qty 1

## 2021-11-12 MED ORDER — BUPROPION HCL ER (XL) 150 MG PO TB24
150.0000 mg | ORAL_TABLET | Freq: Every day | ORAL | Status: DC
  Administered 2021-11-12 – 2021-11-13 (×2): 150 mg via ORAL
  Filled 2021-11-12 (×2): qty 1

## 2021-11-12 MED ORDER — INSULIN ASPART 100 UNIT/ML IJ SOLN
0.0000 [IU] | Freq: Every day | INTRAMUSCULAR | Status: DC
  Administered 2021-11-12: 3 [IU] via SUBCUTANEOUS

## 2021-11-12 NOTE — Progress Notes (Signed)
ASSUMPTION OF CARE NOTE   11/12/2021 11:20 AM  [name] was seen and examined.  The H&P by the admitting provider, orders, imaging was reviewed.  Please see new orders.  Will continue to follow.   Vitals:   11/12/21 0930 11/12/21 1000  BP: (!) 157/62   Pulse: 93   Resp: 17   Temp:  98.8 F (37.1 C)  SpO2: 99%     Results for orders placed or performed during the hospital encounter of 11/11/21  Resp Panel by RT-PCR (Flu A&B, Covid) Anterior Nasal Swab   Specimen: Anterior Nasal Swab  Result Value Ref Range   SARS Coronavirus 2 by RT PCR NEGATIVE NEGATIVE   Influenza A by PCR NEGATIVE NEGATIVE   Influenza B by PCR NEGATIVE NEGATIVE  Blood Culture (routine x 2)   Specimen: BLOOD RIGHT ARM  Result Value Ref Range   Specimen Description BLOOD RIGHT ARM    Special Requests      Normal BOTTLES DRAWN AEROBIC AND ANAEROBIC Blood Culture adequate volume   Culture      NO GROWTH < 12 HOURS Performed at [facility], [address] [place], [place] [address]    Report Status PENDING   Blood Culture (routine x 2)   Specimen: Right Antecubital; Blood  Result Value Ref Range   Specimen Description RIGHT ANTECUBITAL    Special Requests      Normal BOTTLES DRAWN AEROBIC AND ANAEROBIC Blood Culture adequate volume   Culture      NO GROWTH < 12 HOURS Performed at [facility], [address], [place], [place] [address]    Report Status PENDING   Basic metabolic panel  Result Value Ref Range   Sodium 138 135 - 145 mmol/L   Potassium 5.1 3.5 - 5.1 mmol/L   Chloride 100 98 - 111 mmol/L   CO2 24 22 - 32 mmol/L   Glucose, Bld 201 (H) 70 - 99 mg/dL   BUN 27 (H) 8 - 23 mg/dL   Creatinine, Ser 1.90 (H) 0.61 - 1.24 mg/dL   Calcium 9.5 8.9 - 10.3 mg/dL   GFR, Estimated 37 (L) >60 mL/min   Anion gap 14 5 - 15  CBC  Result Value Ref Range   WBC 8.3 4.0 - 10.5 K/uL   RBC 4.38 4.22 - 5.81 MIL/uL   Hemoglobin 12.9 (L) 13.0 - 17.0 g/dL   HCT 39.2 39.0 - 52.0 %   MCV 89.5 80.0 -  100.0 fL   MCH 29.5 26.0 - 34.0 pg   MCHC 32.9 30.0 - 36.0 g/dL   RDW 12.9 11.5 - 15.5 %   Platelets 292 150 - 400 K/uL   nRBC 0.0 0.0 - 0.2 %  Lactic acid, plasma  Result Value Ref Range   Lactic Acid, Venous 1.7 0.5 - 1.9 mmol/L  Lactic acid, plasma  Result Value Ref Range   Lactic Acid, Venous 2.4 (HH) 0.5 - 1.9 mmol/L  Protime-INR  Result Value Ref Range   Prothrombin Time 14.4 11.4 - 15.2 seconds   INR 1.1 0.8 - 1.2  APTT  Result Value Ref Range   aPTT 34 24 - 36 seconds  Urinalysis, Routine w reflex microscopic Anterior Nasal Swab  Result Value Ref Range   Color, Urine YELLOW YELLOW   APPearance CLEAR CLEAR   Specific Gravity, Urine 1.013 1.005 - 1.030   pH 5.0 5.0 - 8.0   Glucose, UA NEGATIVE NEGATIVE mg/dL   Hgb urine dipstick NEGATIVE NEGATIVE  Bilirubin Urine NEGATIVE NEGATIVE   Ketones, ur 20 (A) NEGATIVE mg/dL   Protein, ur 30 (A) NEGATIVE mg/dL   Nitrite NEGATIVE NEGATIVE   Leukocytes,Ua NEGATIVE NEGATIVE   RBC / HPF 0-5 0 - 5 RBC/hpf   WBC, UA 0-5 0 - 5 WBC/hpf   Bacteria, UA NONE SEEN NONE SEEN  TSH  Result Value Ref Range   TSH 0.916 0.350 - 4.500 uIU/mL  Sedimentation rate  Result Value Ref Range   Sed Rate 44 (H) 0 - 16 mm/hr  C-reactive protein  Result Value Ref Range   CRP 8.8 (H) <1.0 mg/dL  Hemoglobin A1c  Result Value Ref Range   Hgb A1c MFr Bld 6.8 (H) 4.8 - 5.6 %   Mean Plasma Glucose 148.46 mg/dL  Comprehensive metabolic panel  Result Value Ref Range   Sodium 132 (L) 135 - 145 mmol/L   Potassium 3.5 3.5 - 5.1 mmol/L   Chloride 104 98 - 111 mmol/L   CO2 22 22 - 32 mmol/L   Glucose, Bld 196 (H) 70 - 99 mg/dL   BUN 25 (H) 8 - 23 mg/dL   Creatinine, Ser 1.48 (H) 0.61 - 1.24 mg/dL   Calcium 8.0 (L) 8.9 - 10.3 mg/dL   Total Protein 6.4 (L) 6.5 - 8.1 g/dL   Albumin 3.0 (L) 3.5 - 5.0 g/dL   AST 14 (L) 15 - 41 U/L   ALT 14 0 - 44 U/L   Alkaline Phosphatase 49 38 - 126 U/L   Total Bilirubin 1.4 (H) 0.3 - 1.2 mg/dL   GFR, Estimated 51  (L) >60 mL/min   Anion gap 6 5 - 15  Magnesium  Result Value Ref Range   Magnesium 1.9 1.7 - 2.4 mg/dL  CBC with Differential/Platelet  Result Value Ref Range   WBC 4.8 4.0 - 10.5 K/uL   RBC 3.62 (L) 4.22 - 5.81 MIL/uL   Hemoglobin 10.7 (L) 13.0 - 17.0 g/dL   HCT 32.1 (L) 39.0 - 52.0 %   MCV 88.7 80.0 - 100.0 fL   MCH 29.6 26.0 - 34.0 pg   MCHC 33.3 30.0 - 36.0 g/dL   RDW 12.8 11.5 - 15.5 %   Platelets 236 150 - 400 K/uL   nRBC 0.0 0.0 - 0.2 %   Neutrophils Relative % 76 %   Neutro Abs 3.7 1.7 - 7.7 K/uL   Lymphocytes Relative 11 %   Lymphs Abs 0.5 (L) 0.7 - 4.0 K/uL   Monocytes Relative 11 %   Monocytes Absolute 0.5 0.1 - 1.0 K/uL   Eosinophils Relative 1 %   Eosinophils Absolute 0.0 0.0 - 0.5 K/uL   Basophils Relative 0 %   Basophils Absolute 0.0 0.0 - 0.1 K/uL   Immature Granulocytes 1 %   Abs Immature Granulocytes 0.03 0.00 - 0.07 K/uL  Procalcitonin  Result Value Ref Range   Procalcitonin 0.57 ng/mL  Lactic acid, plasma  Result Value Ref Range   Lactic Acid, Venous 1.1 0.5 - 1.9 mmol/L  CBG monitoring, ED  Result Value Ref Range   Glucose-Capillary 176 (H) 70 - 99 mg/dL  Troponin I (High Sensitivity)  Result Value Ref Range   Troponin I (High Sensitivity) 3 <18 ng/L  Troponin I (High Sensitivity)  Result Value Ref Range   Troponin I (High Sensitivity) 3 <18 ng/L   C. Wynetta Emery, MD [facility]   11/11/2021  7:56 PM How to contact the [facility] Attending or Consulting provider [place] or covering provider during  after hours 7P -7A, for this patient?  Check the care team in [facility] and look for a) attending/consulting [facility] provider listed and b) the [facility] team listed Log into [email or web address] and use [place]'s universal password to access. If you do not have the password, please contact the hospital operator. Locate the [facility] provider you are looking for under [facility] and page to a number that you can be directly reached. If you still have difficulty reaching  the provider, please page the [facility] (Director on Call) for the Hospitalists listed on amion for assistance.

## 2021-11-12 NOTE — Assessment & Plan Note (Signed)
-   holding home glipizide due to AKI and hypoglycemia risk -- resume other home meds but reduced basal insulin to 10 units -- encouraged frequent CBG monitoring and close outpatient follow up

## 2021-11-12 NOTE — ED Notes (Signed)
Patient awake and alert and voices no complaints at this time.

## 2021-11-12 NOTE — Assessment & Plan Note (Signed)
-   Creatinine at baseline is 1.5-1.7 - cr improved with IV hydration and now at baseline; follow up with Dr. [name] his nephrologist

## 2021-11-12 NOTE — Assessment & Plan Note (Signed)
-   Temp 102.6, heart rate 110, respiratory rate 27, lactic acid 2.4, AKI - Negative COVID and flu - Blood cultures pending - UA is negative for UTI although urine culture still pending - Suspected source is cellulitis of the left great toe - Patient was started on aztreonam, vancomycin, Flagyl, given 3 L bolus in the ED - Trend lactic acid - Procalcitonin pending - Continue to monitor

## 2021-11-12 NOTE — ED Notes (Signed)
Kristen, with lab called to inform 2nd troponin was never received, question to draw now or DC order. Dr Earnest Conroy made aware. A/w response to inform lab

## 2021-11-12 NOTE — Assessment & Plan Note (Addendum)
-  Erythema and edema of the left great toe - There is a fracture there so this could all be injury related, but in a diabetic foot and with the appearance of the toe cellulitis suspected - X-ray did not show any osseous erosion, CRP and ESR pending, may consider MRI of great toe - Patient is currently on broad-spectrum antibiotics until osteomyelitis can be ruled out - Blood cultures pending

## 2021-11-12 NOTE — ED Notes (Signed)
Breakfast tray given to pt 

## 2021-11-12 NOTE — Assessment & Plan Note (Signed)
-   Continue labetalol, nifedipine - Holding enalapril in the setting of AKI

## 2021-11-12 NOTE — H&P (Signed)
History and Physical    Patient: [name] GQQ:[id number] DOB: 08-28-1950 DOA: 11/11/2021 DOS: the patient was seen and examined on 11/12/2021 PCP: [facility]  Patient coming from: Home  Chief Complaint:  Chief Complaint  Patient presents with   Tachycardia   HPI: [name] is a 71 y.o. 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  Patient was in Dr. [name] office when his heart rate was 130 and he was sent to the ED.  Patient reported he had a swimmy like headache at that time that was occurring in his temples and across the front of his head.  The headache is resolved now.  He denies any chest pain, palpitations.  He denies nausea, vomiting, diarrhea, dysuria and cough.  Patient is a poor historian, but reports that he did have some trauma to his left great toe when his dog dragged him for little ways.  Patient reports the toe does not really hurt him.  He notes that there has not been drainage from it, however it is wet now with some drainage.  During patient's stay in the ER he became febrile with a temp up to 102.6.  Sepsis protocol was started and he was given aztreonam, vancomycin, Flagyl.  Suspected sources the left great toe.  X-ray shows no osseous erosion but CRP and ESR pending.  Patient may need an MRI in the a.m.  We will continue broad-spectrum antibiotics for now.  Patient reports he does not smoke, does not drink.  No family at bedside to fill in details of HPI. Review of Systems: As mentioned in the history of present illness. All other systems reviewed and are negative. Past Medical History:  Diagnosis Date   Anxiety    Arthritis    Colon polyps    Depression    Diabetes mellitus without complication (HCC)     Fatty liver    GERD (gastroesophageal reflux disease)    Gout    High cholesterol    History of echocardiogram 09/14/2011   EF >55%; borderline concentric LVH;    History of stress test 06/21/2010   exercise; normal study   Hypertension    MET TEST 08/15/2011   moderate peak VO2 limitation at 66% predicted; mod cardiac impairment with low SV & low anaerobic threshold, mild chronotropic incompetence; low risk   Nausea    Osteomyelitis (HCC)    right great toe   Renal disorder    Renal insufficiency    Thrombocytopenia ([place])    PMH   Past Surgical History:  Procedure Laterality Date   AMPUTATION Right 02/13/2019   Procedure: RIGHT GREAT TOE AMPUTATION;  Surgeon: [name], MD;  Location: [place];  Service: Orthopedics;  Laterality: Right;   CHOLECYSTECTOMY     COLONOSCOPY N/A 09/21/2012   Procedure: COLONOSCOPY;  Surgeon: Rogene Houston, MD;  [facility];  Service: Endoscopy;  Laterality: N/A;  730   COLONOSCOPY N/A 01/30/2019   rehman, eight 4-7 mm polyps in rectum in sigmoid and at splenic flexure, transverse colon and in cecum, one 34m polyp in recto-sigmoid colon, clip was placed, nine polyps total removed   ESOPHAGOGASTRODUODENOSCOPY  11/2005   normal mucosa and GE junction   HERNIA REPAIR     x 2   HIP SURGERY  Rt hip age 33   NASAL POLYP SURGERY      2-3 months ago   POLYPECTOMY  01/30/2019   Procedure: POLYPECTOMY;  Surgeon: Rogene Houston, MD;  Location: [facility];  Service: Endoscopy;;  proximal transverse colon, cecal   Social History:  reports that he has never smoked. He has never used smokeless tobacco. He reports that he does not drink alcohol and does not use drugs.  Allergies  Allergen Reactions   Penicillins Other (See Comments)    Unknown Did it involve swelling of the face/tongue/throat, SOB, or low BP? Unknown Did it involve sudden or severe rash/hives, skin peeling, or any reaction on the inside of your mouth or nose? Unknown Did  you need to seek medical attention at a hospital or doctor's office? Unknown When did it last happen?      childhood allergy If all above answers are "NO", may proceed with cephalosporin use.    Ivp Dye [Iodinated Contrast Media]     Sped heart reate up    Tetanus Toxoids Rash    Family History  Problem Relation Age of Onset   Colon cancer Brother     Prior to Admission medications   Medication Sig Start Date End Date Taking? Authorizing Provider  acetaminophen (TYLENOL) 500 MG tablet Take 1,000 mg by mouth every 6 (six) hours as needed. Pain    [provider]  atorvastatin (LIPITOR) 10 MG tablet Take 10 mg by mouth at bedtime. 09/21/17   [provider]  B Complex-C (B-COMPLEX WITH VITAMIN C) tablet Take 1 tablet by mouth daily.    [provider]  buPROPion (WELLBUTRIN XL) 150 MG 24 hr tablet Take 150 mg by mouth at bedtime.  08/29/12   [provider]  cetirizine (ZYRTEC) 10 MG tablet Take 10 mg by mouth daily.    [provider]  Cholecalciferol (VITAMIN D) 50 MCG (2000 UT) CAPS Take 2,000 Units by mouth daily.     [provider]  enalapril (VASOTEC) 10 MG tablet Take 10 mg by mouth daily. 03/10/20   [provider]  ferrous sulfate 325 (65 FE) MG EC tablet Take 325 mg by mouth daily with breakfast. Patient not taking: Reported on 12/22/2020    [provider]  glipiZIDE (GLUCOTROL) 5 MG tablet Take 5 mg by mouth 2 (two) times daily before a meal. 07/18/17   [provider]  labetalol (NORMODYNE) 200 MG tablet Take 400 mg by mouth 2 (two) times daily. 12/07/20   [provider]  loperamide (IMODIUM) 2 MG capsule TAKE ONE CAPSULE BY MOUTH TWO TIMES DAILY AS NEEDED FOR DIARRHEA OR LOOSE STOOLS 09/02/21   Carlan, Chelsea L, NP  NIFEdipine (PROCARDIA XL/NIFEDICAL XL) 60 MG 24 hr tablet Take 60 mg by mouth daily. 12/07/20   [provider]  Omega-3 Fatty Acids (FISH OIL) 1000 MG CAPS Take 1,000 mg  by mouth 2 (two) times daily. Afternoon & bedtime.    [provider]  omeprazole (PRILOSEC) 20 MG capsule Take 1 capsule (20 mg total) by mouth daily. 12/22/20   Carlan, Chelsea L, NP  pioglitazone (ACTOS) 30 MG tablet Take 30 mg by mouth daily. 10/28/17   [provider]  sertraline (ZOLOFT) 100 MG tablet Take 100 mg by mouth at bedtime.  05/09/15   [provider]  sodium bicarbonate 650 MG tablet Take 650 mg by mouth 2 (two) times daily. 07/18/17   [provider]  [place] 300  UNIT/ML SOPN Inject 20 Units into the skin daily. 12/24/18   [provider]  ULORIC 80 MG TABS Take 80 mg by mouth daily.  04/21/15   [provider]    Physical Exam: Vitals:   11/12/21 0000 11/12/21 0130 11/12/21 0200 11/12/21 0230  BP: (!) 150/64 129/70 (!) 149/71 (!) 155/75  Pulse: 98 93 92 87  Resp: 18 20 17  (!) 23  Temp: 98.7 F (37.1 C)     TempSrc:      SpO2: 98% 99% 98% 96%  Weight:      Height:       1.  General: Patient lying supine in bed,  no acute distress   2. Psychiatric: Alert and oriented x 3, mood and behavior normal for situation, pleasant and cooperative with exam   3. Neurologic: Speech is slurred, but at baseline and language is normal, face is symmetric, moves all 4 extremities voluntarily, at baseline without acute deficits on limited exam   4. HEENMT:  Head is atraumatic, normocephalic, pupils reactive to light, neck is supple, trachea is midline, mucous membranes are moist   5. Respiratory : Lungs are clear to auscultation bilaterally without wheezing, rhonchi, rales, no cyanosis, no increase in work of breathing or accessory muscle use   6. Cardiovascular : Heart rate normal, rhythm is regular, no rubs or gallops, no peripheral edema, peripheral pulses palpated   7. Gastrointestinal:  Abdomen is soft, nondistended, nontender to palpation bowel sounds active, no masses or organomegaly palpated   8. Skin:   Erythema left great toe with some serous drainage   9.Musculoskeletal:  Right great toe is edematous, minimally tender to the touch, no calf tenderness, peripheral pulses palpated  Data Reviewed: In the ED Temp 98.7-102.6, heart rate 110-131, respiratory rate 18-27, blood pressure 130/70, maintaining oxygen sats on room air No leukocytosis with a white blood cell count of 8.3, hemoglobin 12.9 Chemistry reveals a creatinine at 1.9 that is up from baseline 1.47 Lactic acid 1.7 and then 2.4 Negative COVID and flu Blood culture pending UA is not suspicious for UTI, but urine culture is pending Left great toe shows no osseous erosion on x-ray but it does have an anatomically aligned fracture EKG shows a heart rate of 121, sinus tach, QTc 433 Patient was given Tylenol, aztreonam, 3 L bolus, LR 150 mL/h, vancomycin, and Flagyl Admission requested for sepsis and AKI  Assessment and Plan: * Sepsis ([place]) - Temp 102.6, heart rate 110, respiratory rate 27, lactic acid 2.4, AKI - Negative COVID and flu - Blood cultures pending - UA is negative for UTI although urine culture still pending - Suspected source is cellulitis of the left great toe - Patient was started on aztreonam, vancomycin, Flagyl, given 3 L bolus in the ED - Trend lactic acid - Procalcitonin pending - Continue to monitor  AKI (acute kidney injury) ([place]) - Creatinine at baseline is 1.47 - Today creatinine is 1.90 - Secondary to sepsis? - 3 L bolus given in the ED, followed by 150 mL/h of LR - Hold enalapril and other nephrotoxic agents when possible - Trend in the a.m.  Cellulitis in diabetic foot (HCC) - Erythema and edema of the left great toe - There is a fracture there so this could all be injury related, but in a diabetic foot and with the appearance of the toe cellulitis suspected - X-ray did not show any osseous erosion, CRP and ESR pending, may consider MRI of great toe - Patient is  currently on broad-spectrum  antibiotics until osteomyelitis can be ruled out - Blood cultures pending  DM (diabetes mellitus) (HCC) - Last hemoglobin A1c was 8.6 - Update hemoglobin A1c - 20 units of basal insulin at baseline - Continue 12 units of basal insulin - Continue sliding scale coverage - Heart healthy, carb modified diet - Continue to monitor  Anxiety - Continue SSRI - We will add as needed medication for anxiety if needed  Hypertension - Continue labetalol, nifedipine - Holding enalapril in the setting of AKI      Advance Care Planning:   Code Status: Full Code   Consults: None  Family Communication: No family at bedside  Severity of Illness: The appropriate patient status for this patient is INPATIENT. Inpatient status is judged to be reasonable and necessary in order to provide the required intensity of service to ensure the patient's safety. The patient's presenting symptoms, physical exam findings, and initial radiographic and laboratory data in the context of their chronic comorbidities is felt to place them at high risk for further clinical deterioration. Furthermore, it is not anticipated that the patient will be medically stable for discharge from the hospital within 2 midnights of admission.   * I certify that at the point of admission it is my clinical judgment that the patient will require inpatient hospital care spanning beyond 2 midnights from the point of admission due to high intensity of service, high risk for further deterioration and high frequency of surveillance required.*  Author: Rolla Plate, DO 11/12/2021 3:25 AM  For on call review [email or web address].

## 2021-11-12 NOTE — Progress Notes (Signed)
  Transition of Care (TOC) Screening Note   Patient Details  Name: [name] Date of Birth: [birth year]   Transition of Care [facility]) CM/SW Contact:    Ihor Gully, LCSW Phone Number: 11/12/2021, 3:06 PM    Transition of Care Department [facility]) has reviewed patient and no TOC needs have been identified at this time. We will continue to monitor patient advancement through interdisciplinary progression rounds. If new patient transition needs arise, please place a TOC consult.

## 2021-11-12 NOTE — ED Notes (Signed)
Patient's relative mary, updated on plan of care at this time.

## 2021-11-12 NOTE — ED Notes (Signed)
Dr Earnest Conroy informs ok to get 2nd trop with morning labs- kiersten, in lab made aware- report they have in lab now

## 2021-11-12 NOTE — Assessment & Plan Note (Signed)
-   Continue home meds °

## 2021-11-13 DIAGNOSIS — R6520 Severe sepsis without septic shock: Secondary | ICD-10-CM | POA: Diagnosis not present

## 2021-11-13 DIAGNOSIS — A419 Sepsis, unspecified organism: Secondary | ICD-10-CM | POA: Diagnosis not present

## 2021-11-13 DIAGNOSIS — N179 Acute kidney failure, unspecified: Secondary | ICD-10-CM | POA: Diagnosis not present

## 2021-11-13 DIAGNOSIS — E11628 Type 2 diabetes mellitus with other skin complications: Secondary | ICD-10-CM | POA: Diagnosis not present

## 2021-11-13 LAB — URINE CULTURE: Special Requests: NORMAL

## 2021-11-13 LAB — HIV ANTIBODY (ROUTINE TESTING W REFLEX): HIV Screen 4th Generation wRfx: NONREACTIVE

## 2021-11-13 LAB — COMPREHENSIVE METABOLIC PANEL
ALT: 13 U/L (ref 0–44)
AST: 16 U/L (ref 15–41)
Albumin: 2.7 g/dL — ABNORMAL LOW (ref 3.5–5.0)
Alkaline Phosphatase: 47 U/L (ref 38–126)
Anion gap: 9 (ref 5–15)
BUN: 20 mg/dL (ref 8–23)
CO2: 25 mmol/L (ref 22–32)
Calcium: 8.2 mg/dL — ABNORMAL LOW (ref 8.9–10.3)
Chloride: 103 mmol/L (ref 98–111)
Creatinine, Ser: 1.56 mg/dL — ABNORMAL HIGH (ref 0.61–1.24)
GFR, Estimated: 47 mL/min — ABNORMAL LOW (ref >60)
Glucose, Bld: 185 mg/dL — ABNORMAL HIGH (ref 70–99)
Potassium: 4.2 mmol/L (ref 3.5–5.1)
Sodium: 137 mmol/L (ref 135–145)
Total Bilirubin: 1 mg/dL (ref 0.3–1.2)
Total Protein: 6.1 g/dL — ABNORMAL LOW (ref 6.5–8.1)

## 2021-11-13 LAB — CBC WITH DIFFERENTIAL/PLATELET
Abs Immature Granulocytes: 0.02 10*3/uL (ref 0.00–0.07)
Basophils Absolute: 0 10*3/uL (ref 0.0–0.1)
Basophils Relative: 1 %
Eosinophils Absolute: 0.1 10*3/uL (ref 0.0–0.5)
Eosinophils Relative: 2 %
HCT: 29.9 % — ABNORMAL LOW (ref 39.0–52.0)
Hemoglobin: 10 g/dL — ABNORMAL LOW (ref 13.0–17.0)
Immature Granulocytes: 1 %
Lymphocytes Relative: 16 %
Lymphs Abs: 0.6 10*3/uL — ABNORMAL LOW (ref 0.7–4.0)
MCH: 29.4 pg (ref 26.0–34.0)
MCHC: 33.4 g/dL (ref 30.0–36.0)
MCV: 87.9 fL (ref 80.0–100.0)
Monocytes Absolute: 0.6 10*3/uL (ref 0.1–1.0)
Monocytes Relative: 15 %
Neutro Abs: 2.6 10*3/uL (ref 1.7–7.7)
Neutrophils Relative %: 65 %
Platelets: 234 10*3/uL (ref 150–400)
RBC: 3.4 MIL/uL — ABNORMAL LOW (ref 4.22–5.81)
RDW: 13.1 % (ref 11.5–15.5)
WBC: 3.9 10*3/uL — ABNORMAL LOW (ref 4.0–10.5)
nRBC: 0 % (ref 0.0–0.2)

## 2021-11-13 LAB — GLUCOSE, CAPILLARY
Glucose-Capillary: 130 mg/dL — ABNORMAL HIGH (ref 70–99)
Glucose-Capillary: 198 mg/dL — ABNORMAL HIGH (ref 70–99)
Glucose-Capillary: 203 mg/dL — ABNORMAL HIGH (ref 70–99)
Glucose-Capillary: 153 mg/dL — ABNORMAL HIGH (ref 70–99)

## 2021-11-13 LAB — MAGNESIUM: Magnesium: 1.9 mg/dL (ref 1.7–2.4)

## 2021-11-13 LAB — PROCALCITONIN: Procalcitonin: 0.67 ng/mL

## 2021-11-13 MED ORDER — DOXYCYCLINE HYCLATE 100 MG PO TABS
100.0000 mg | ORAL_TABLET | Freq: Two times a day (BID) | ORAL | Status: DC
  Administered 2021-11-13 – 2021-11-14 (×2): 100 mg via ORAL
  Filled 2021-11-13 (×2): qty 1

## 2021-11-13 MED ORDER — INFLUENZA VAC A&B SA ADJ QUAD 0.5 ML IM PRSY
0.5000 mL | PREFILLED_SYRINGE | INTRAMUSCULAR | Status: AC
  Administered 2021-11-14: 0.5 mL via INTRAMUSCULAR
  Filled 2021-11-13: qty 0.5

## 2021-11-13 NOTE — Progress Notes (Signed)
PROGRESS NOTE   [name]  VAN:[id number] DOB: 02-05-1951 DOA: 11/11/2021 PCP: [facility]   Chief Complaint  Patient presents with   Tachycardia   Level of care: Telemetry  Brief Admission History:   71 y.o. 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  Patient was in Dr. [name] office when his heart rate was 130 and he was sent to the ED.  Patient reported he had a swimmy like headache at that time that was occurring in his temples and across the front of his head.  The headache is resolved now.  He denies any chest pain, palpitations.  He denies nausea, vomiting, diarrhea, dysuria and cough.  Patient is a poor historian, but reports that he did have some trauma to his left great toe when his dog dragged him for little ways.  Patient reports the toe does not really hurt him.  He notes that there has not been drainage from it, however it is wet now with some drainage.  During patient's stay in the ER he became febrile with a temp up to 102.6.  Sepsis protocol was started and he was given aztreonam, vancomycin, Flagyl.  Suspected sources the left great toe.  X-ray shows no osseous erosion but CRP and ESR pending.  Patient may need an MRI in the a.m.  We will continue broad-spectrum antibiotics for now.   Patient reports he does not smoke, does not drink.   Assessment and Plan: * Sepsis ([place]) - Temp 102.6, heart rate 110, respiratory rate 27, lactic acid 2.4, AKI - Negative COVID and flu - Blood cultures NGTD - Sepsis physiology resolved  AKI (acute kidney injury) ([place]) - Creatinine at baseline is 1.5-1.7 - cr improved with IV hydration  Cellulitis in diabetic foot ([facility]) - Erythema and edema of the left great toe - There is a fracture  there so this could all be injury related, but in a diabetic foot and with the appearance of the toe cellulitis suspected - Blood cultures NGTD  DM (diabetes mellitus) ([place]) - Last hemoglobin A1c was 8.6 - Update hemoglobin A1c - 20 units of basal insulin at baseline - Continue 12 units of basal insulin - Continue sliding scale coverage - Heart healthy, carb modified diet - Continue to monitor  Anxiety - Continue SSRI - We will add as needed medication for anxiety if needed  Hypertension - Continue labetalol, nifedipine - Holding enalapril in the setting of AKI   DVT prophylaxis: [place] heparin Code Status: full  Family Communication:  Disposition: Status is: Inpatient Remains inpatient appropriate because: IV antibiotics   Consultants:   Procedures:   Antimicrobials:  Aztreonam - 9/30 Vancomycin - 9/30 doxycycline Subjective: Awake, alert, cooperative, less pain in toe today Objective: Vitals:   11/12/21 1221 11/12/21 1617 11/12/21 2050 11/13/21 0515  BP: (!) 161/80 123/62 127/75 (!) 109/57  Pulse: 86 88 92 85  Resp: 20 18 18 18   Temp: 98.2 F (36.8 C) 98.2 F (36.8 C) 99.9 F (37.7 C) 97.7 F (36.5 C)  TempSrc: Oral  Oral Oral  SpO2: 99% 95% 97% 98%  Weight:      Height:        Intake/Output Summary (Last 24 hours) at 11/13/2021 1243 Last data filed at 11/13/2021 0700 Gross per 24 hour  Intake 1590.07  ml  Output 1975 ml  Net -384.93 ml   Filed Weights   11/11/21 1622  Weight: 95.9 kg   Examination:  General exam: Appears calm and comfortable  Respiratory system: Clear to auscultation. Respiratory effort normal. Cardiovascular system: normal S1 & S2 heard. No JVD, murmurs, rubs, gallops or clicks. No pedal edema. Gastrointestinal system: Abdomen is nondistended, soft and nontender. No organomegaly or masses felt. Normal bowel sounds heard. Central nervous system: Alert and oriented. No focal neurological deficits. Extremities: left great toe  inflamed, cellulitic changes with erythema, edema, heat palpated Skin: as above Psychiatry: Judgement and insight appear normal. Mood & affect appropriate.   Data Reviewed: I have personally reviewed following labs and imaging studies  CBC: Recent Labs  Lab 11/11/21 1745 11/12/21 0331 11/13/21 0954  WBC 8.3 4.8 3.9*  NEUTROABS  --  3.7 2.6  HGB 12.9* 10.7* 10.0*  HCT 39.2 32.1* 29.9*  MCV 89.5 88.7 87.9  PLT 292 236 937    Basic Metabolic Panel: Recent Labs  Lab 11/11/21 1745 11/12/21 0331 11/13/21 0954  NA 138 132* 137  K 5.1 3.5 4.2  CL 100 104 103  CO2 24 22 25   GLUCOSE 201* 196* 185*  BUN 27* 25* 20  CREATININE 1.90* 1.48* 1.56*  CALCIUM 9.5 8.0* 8.2*  MG  --  1.9 1.9    CBG: Recent Labs  Lab 11/12/21 1145 11/12/21 1618 11/12/21 2148 11/13/21 0719 11/13/21 1107  GLUCAP 214* 147* 258* 130* 198*    Recent Results (from the past 240 hour(s))  Resp Panel by RT-PCR (Flu A&B, Covid) Anterior Nasal Swab     Status: None   Collection Time: 11/11/21  8:28 PM   Specimen: Anterior Nasal Swab  Result Value Ref Range Status   SARS Coronavirus 2 by RT PCR NEGATIVE NEGATIVE Final    Comment: (NOTE) SARS-CoV-2 target nucleic acids are NOT DETECTED.  The SARS-CoV-2 RNA is generally detectable in upper respiratory specimens during the acute phase of infection. The lowest concentration of SARS-CoV-2 viral copies this assay can detect is 138 copies/mL. A negative result does not preclude SARS-Cov-2 infection and should not be used as the sole basis for treatment or other patient management decisions. A negative result may occur with  improper specimen collection/handling, submission of specimen other than nasopharyngeal swab, presence of viral mutation(s) within the areas targeted by this assay, and inadequate number of viral copies(<138 copies/mL). A negative result must be combined with clinical observations, patient history, and epidemiological information. The  expected result is Negative.  Fact Sheet for Patients:  EntrepreneurPulse.com.au  Fact Sheet for Healthcare Providers:  IncredibleEmployment.be  This test is no t yet approved or cleared by the [place] FDA and  has been authorized for detection and/or diagnosis of SARS-CoV-2 by FDA under an Emergency Use Authorization (EUA). This EUA will remain  in effect (meaning this test can be used) for the duration of the COVID-19 declaration under Section 564(b)(1) of the Act, 21 U.S.C.section 360bbb-3(b)(1), unless the authorization is terminated  or revoked sooner.       Influenza A by PCR NEGATIVE NEGATIVE Final   Influenza B by PCR NEGATIVE NEGATIVE Final    Comment: (NOTE) The Xpert Xpress SARS-CoV-2/FLU/RSV plus assay is intended as an aid in the diagnosis of influenza from Nasopharyngeal swab specimens and should not be used as a sole basis for treatment. Nasal washings and aspirates are unacceptable for Xpert Xpress SARS-CoV-2/FLU/RSV testing.  Fact Sheet for Patients: EntrepreneurPulse.com.au  Fact Sheet  for Healthcare Providers: IncredibleEmployment.be  This test is not yet approved or cleared by the [place] and has been authorized for detection and/or diagnosis of SARS-CoV-2 by FDA under an Emergency Use Authorization (EUA). This EUA will remain in effect (meaning this test can be used) for the duration of the COVID-19 declaration under Section 564(b)(1) of the Act, 21 U.S.C. section 360bbb-3(b)(1), unless the authorization is terminated or revoked.  Performed at [facility], [address], [place], [place] [address]   Blood Culture (routine x 2)     Status: None (Preliminary result)   Collection Time: 11/11/21  8:55 PM   Specimen: BLOOD RIGHT ARM  Result Value Ref Range Status   Specimen Description BLOOD RIGHT ARM  Final   Special Requests   Final    Normal BOTTLES DRAWN AEROBIC  AND ANAEROBIC Blood Culture adequate volume   Culture   Final    NO GROWTH 2 DAYS Performed at [facility], [address], [place], [place] [address]    Report Status PENDING  Incomplete  Blood Culture (routine x 2)     Status: None (Preliminary result)   Collection Time: 11/11/21  8:55 PM   Specimen: Right Antecubital; Blood  Result Value Ref Range Status   Specimen Description RIGHT ANTECUBITAL  Final   Special Requests   Final    Normal BOTTLES DRAWN AEROBIC AND ANAEROBIC Blood Culture adequate volume   Culture   Final    NO GROWTH 2 DAYS Performed at [facility], [address] [place], [place] [address]    Report Status PENDING  Incomplete  Urine Culture     Status: Abnormal   Collection Time: 11/12/21  1:03 AM   Specimen: In/Out Cath Urine  Result Value Ref Range Status   Specimen Description   Final    IN/OUT CATH URINE Performed at [facility], [address], [place], [place] [address]    Special Requests   Final    Normal Performed at [facility], [address] [place], [place] [address]    Culture MULTIPLE SPECIES PRESENT, SUGGEST RECOLLECTION (A)  Final   Report Status 11/13/2021 FINAL  Final  MRSA Next Gen by PCR, Nasal     Status: None   Collection Time: 11/12/21 11:14 AM   Specimen: Nasal Mucosa; Nasal Swab  Result Value Ref Range Status   MRSA by PCR Next Gen NOT DETECTED NOT DETECTED Final    Comment: (NOTE) The GeneXpert MRSA Assay (FDA approved for NASAL specimens only), is one component of a comprehensive MRSA colonization surveillance program. It is not intended to diagnose MRSA infection nor to guide or monitor treatment for MRSA infections. Test performance is not FDA approved in patients less than 105 years old. Performed at [facility], [address], [place], [place] [address]      Radiology Studies: DG Toe Great Left  Result Date: 11/11/2021 CLINICAL DATA:  Osteomyelitis EXAM: LEFT GREAT TOE COMPARISON:  None Available. FINDINGS: There  is a minimally displaced anatomically aligned fracture of the distal tuft of the left great toe. No superimposed osseous erosions are identified. No abnormal periosteal reaction. 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 There is extensive soft tissue swelling of the great toe. Vascular calcifications are noted within the visualized left forefoot. IMPRESSION: Minimally displaced anatomically aligned fracture of the distal tuft of the left great toe. Extensive soft tissue swelling of the left great toe. No superimposed osseous erosion. Electronically Signed  By: Fidela Salisbury M.D.   On: 11/11/2021 21:15   DG Chest 2 View  Result Date: 11/11/2021 CLINICAL DATA:  Chest pain EXAM: CHEST - 2 VIEW COMPARISON:  Chest x-ray 07/03/2021 FINDINGS: The heart size and mediastinal contours are within normal limits. Both lungs are clear. The visualized skeletal structures are unremarkable. IMPRESSION: No active cardiopulmonary disease. Electronically Signed   By: Ronney Asters M.D.   On: 11/11/2021 16:48    Scheduled Meds:  atorvastatin  10 mg Oral QHS   buPROPion  150 mg Oral QHS   heparin  5,000 Units Subcutaneous Q8H   [START ON 11/14/2021] influenza vaccine adjuvanted  0.5 mL Intramuscular Tomorrow-1000   insulin aspart  0-15 Units Subcutaneous TID WC   insulin aspart  0-5 Units Subcutaneous QHS   insulin detemir  12 Units Subcutaneous QHS   labetalol  400 mg Oral BID   NIFEdipine  60 mg Oral Daily   pantoprazole  40 mg Oral Daily   sertraline  100 mg Oral QHS   Continuous Infusions:  aztreonam 2 g (11/13/21 0538)   lactated ringers 70 mL/hr at 11/12/21 1557   vancomycin 1,500 mg (11/12/21 2102)     LOS: 2 days   Time spent: 35 mins  Nereyda Bowler Wynetta Emery, MD How to contact the [facility] Attending or Consulting provider [place] or covering provider during after hours 7P -7A, for this patient?  Check the care team in [facility] and look for a) attending/consulting TRH  provider listed and b) the [facility] team listed Log into [email or web address] and use [facility]'s universal password to access. If you do not have the password, please contact the hospital operator. Locate the [facility] provider you are looking for under [facility] and page to a number that you can be directly reached. If you still have difficulty reaching the provider, please page the [facility] (Director on Call) for the Hospitalists listed on amion for assistance.  11/13/2021, 12:43 PM

## 2021-11-13 NOTE — Hospital Course (Signed)
71 y.o. 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  Patient was in Dr. [name] office when his heart rate was 130 and he was sent to the ED.  Patient reported he had a swimmy like headache at that time that was occurring in his temples and across the front of his head.  The headache is resolved now.  He denies any chest pain, palpitations.  He denies nausea, vomiting, diarrhea, dysuria and cough.  Patient is a poor historian, but reports that he did have some trauma to his left great toe when his dog dragged him for little ways.  Patient reports the toe does not really hurt him.  He notes that there has not been drainage from it, however it is wet now with some drainage.  During patient's stay in the ER he became febrile with a temp up to 102.6.  Sepsis protocol was started and he was given aztreonam, vancomycin, Flagyl.  Suspected sources the left great toe.  X-ray shows no osseous erosion but CRP and ESR pending.  Patient may need an MRI in the a.m.  We will continue broad-spectrum antibiotics for now.   Patient reports he does not smoke, does not drink.

## 2021-11-14 DIAGNOSIS — A419 Sepsis, unspecified organism: Secondary | ICD-10-CM | POA: Diagnosis not present

## 2021-11-14 DIAGNOSIS — N179 Acute kidney failure, unspecified: Secondary | ICD-10-CM | POA: Diagnosis not present

## 2021-11-14 DIAGNOSIS — E1122 Type 2 diabetes mellitus with diabetic chronic kidney disease: Secondary | ICD-10-CM | POA: Diagnosis not present

## 2021-11-14 DIAGNOSIS — E11628 Type 2 diabetes mellitus with other skin complications: Secondary | ICD-10-CM | POA: Diagnosis not present

## 2021-11-14 LAB — GLUCOSE, CAPILLARY
Glucose-Capillary: 147 mg/dL — ABNORMAL HIGH (ref 70–99)
Glucose-Capillary: 174 mg/dL — ABNORMAL HIGH (ref 70–99)

## 2021-11-14 MED ORDER — NIFEDIPINE ER 30 MG PO TB24: 30.0000 mg | ORAL_TABLET | Freq: Every day | ORAL | Status: DC

## 2021-11-14 MED ORDER — TOUJEO MAX SOLOSTAR 300 UNIT/ML ~~LOC~~ SOPN: 10.0000 [IU] | PEN_INJECTOR | Freq: Every day | SUBCUTANEOUS | Status: DC

## 2021-11-14 MED ORDER — DOXYCYCLINE HYCLATE 100 MG PO TABS: 100.0000 mg | ORAL_TABLET | Freq: Two times a day (BID) | ORAL | 0 refills | Status: AC

## 2021-11-14 NOTE — Discharge Summary (Signed)
Physician Discharge Summary  [name] WIO:[id number] DOB: 1951-02-11 DOA: 11/11/2021  PCP: [facility]  Admit date: 11/11/2021 Discharge date: 11/14/2021  Admitted From:  Home  Disposition: Home   Recommendations for Outpatient Follow-up:  Follow up with PCP in 1 weeks Continue daily wound care and dressing changes Please monitor CBG 3-4 times per day   Discharge Condition: STABLE   CODE STATUS: FULL DIET: heart healthy carb modified    Brief Hospitalization Summary: Please see all hospital notes, images, labs for full details of the hospitalization. ADMISSION HPI:  71 y.o. 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  Patient was in Dr. Toya Grant office when his heart rate was 130 and he was sent to the ED.  Patient reported he had a swimmy like headache at that time that was occurring in his temples and across the front of his head.  The headache is resolved now.  He denies any chest pain, palpitations.  He denies nausea, vomiting, diarrhea, dysuria and cough.  Patient is a poor historian, but reports that he did have some trauma to his left great toe when his dog dragged him for little ways.  Patient reports the toe does not really hurt him.  He notes that there has not been drainage from it, however it is wet now with some drainage.  During patient's stay in the ER he became febrile with a temp up to 102.6.  Sepsis protocol was started and he was given aztreonam, vancomycin, Flagyl.  Suspected sources the left great toe.  X-ray shows no osseous erosion but CRP and ESR pending.  Patient may need an MRI in the a.m.  We will continue broad-spectrum antibiotics for now.   Patient reports he does not smoke, does not drink.   No family at  bedside to fill in details of HPI.  Hospital Course by Problem   * Sepsis (HCC) - Temp 102.6, heart rate 110, respiratory rate 27, lactic acid 2.4, AKI - Negative COVID and flu - Blood cultures NGTD - Sepsis physiology resolved  AKI (acute kidney injury) ([place]) - Creatinine at baseline is 1.5-1.7 - cr improved with IV hydration and now at baseline; follow up with Dr. Theador Grant his nephrologist   Cellulitis in diabetic foot ([place]) - Erythema and edema of the left great toe - There is a fracture there so this could all be injury related, but in a diabetic foot and with the appearance of the toe cellulitis suspected - Blood cultures NGTD -- Improved greatly with IV antibiotics, complete course of oral doxycycline 100 mg BID x 5 more days  DM (diabetes mellitus) ([place]) - holding home glipizide due to AKI and hypoglycemia risk -- resume other home meds but reduced basal insulin to 10 units -- encouraged frequent CBG monitoring and close outpatient follow up  Anxiety - Continue home meds   Hypertension - Continue labetalol, nifedipine  - Holding enalapril in the setting of AKI and soft BPs   Discharge Diagnoses:  Principal Problem:   Sepsis ([place]) Active Problems:   Hypertension   Anxiety   DM (diabetes mellitus) ([place])   Cellulitis in diabetic foot ([place])   AKI (acute kidney injury) ([place])   Discharge Instructions:  Allergies as of 11/14/2021       Reactions   Penicillins Other (  See Comments)   Unknown Did it involve swelling of the face/tongue/throat, SOB, or low BP? Unknown Did it involve sudden or severe rash/hives, skin peeling, or any reaction on the inside of your mouth or nose? Unknown Did you need to seek medical attention at a hospital or doctor's office? Unknown When did it last happen?      childhood allergy If all above answers are "NO", may proceed with cephalosporin use.   Ivp Dye [iodinated Contrast Media]    Sped heart reate up    Tetanus Toxoids Rash         Medication List     STOP taking these medications    enalapril 10 MG tablet Commonly known as: VASOTEC   glipiZIDE 5 MG tablet Commonly known as: GLUCOTROL   hydrALAZINE 50 MG tablet Commonly known as: APRESOLINE   loratadine 10 MG tablet Commonly known as: CLARITIN       TAKE these medications    acetaminophen 500 MG tablet Commonly known as: TYLENOL Take 500-1,000 mg by mouth every 6 (six) hours as needed for mild pain or headache. Pain   atorvastatin 10 MG tablet Commonly known as: LIPITOR Take 10 mg by mouth at bedtime.   B-complex with vitamin C tablet Take 1 tablet by mouth daily.   buPROPion 150 MG 24 hr tablet Commonly known as: WELLBUTRIN XL Take 150 mg by mouth at bedtime.   cetirizine 10 MG tablet Commonly known as: ZYRTEC Take 10 mg by mouth daily as needed for allergies.   doxycycline 100 MG tablet Commonly known as: VIBRA-TABS Take 1 tablet (100 mg total) by mouth every 12 (twelve) hours for 5 days.   ferrous sulfate 325 (65 FE) MG EC tablet Take 325 mg by mouth daily with breakfast.   Fish Oil 1000 MG Caps Take 1,000 mg by mouth 2 (two) times daily. Afternoon & bedtime.   labetalol 300 MG tablet Commonly known as: NORMODYNE Take 600 mg by mouth 2 (two) times daily.   loperamide 2 MG capsule Commonly known as: IMODIUM TAKE ONE CAPSULE BY MOUTH TWO TIMES DAILY AS NEEDED FOR DIARRHEA OR LOOSE STOOLS What changed: See the new instructions.   mupirocin ointment 2 % Commonly known as: BACTROBAN Apply 1 Application topically 3 (three) times daily.   NIFEdipine 30 MG 24 hr tablet Commonly known as: ADALAT CC Take 1 tablet (30 mg total) by mouth daily. What changed:  medication strength how much to take   omeprazole 20 MG capsule Commonly known as: PRILOSEC Take 1 capsule (20 mg total) by mouth daily.   pioglitazone 30 MG tablet Commonly known as: ACTOS Take 30 mg by mouth daily.   sertraline 100 MG tablet Commonly known as:  ZOLOFT Take 100 mg by mouth at bedtime.   sodium bicarbonate 650 MG tablet Take 650 mg by mouth 2 (two) times daily.   Toujeo Max SoloStar 300 UNIT/ML Solostar Pen Generic drug: insulin glargine (2 Unit Dial) Inject 10 Units into the skin daily. What changed: how much to take   Uloric 80 MG Tabs Generic drug: Febuxostat Take 80 mg by mouth daily.   Vitamin D (Ergocalciferol) 1.25 MG (50000 UNIT) Caps capsule Commonly known as: DRISDOL Take 50,000 Units by mouth once a week. Sundays   Vitamin D 50 MCG (2000 UT) Caps Take 2,000 Units by mouth daily.        Follow-up Information     [facility] [employer]. Schedule an appointment as soon as possible for a  visit in 1 week(s).   Specialty: Family Medicine Why: Hospital Follow Up Contact information: [place] [address] [phone]         Arnoldo Lenis, MD .   Specialty: Cardiology Contact information: [place] [address] [phone]                Allergies  Allergen Reactions   Penicillins Other (See Comments)    Unknown Did it involve swelling of the face/tongue/throat, SOB, or low BP? Unknown Did it involve sudden or severe rash/hives, skin peeling, or any reaction on the inside of your mouth or nose? Unknown Did you need to seek medical attention at a hospital or doctor's office? Unknown When did it last happen?      childhood allergy If all above answers are "NO", may proceed with cephalosporin use.    Ivp Dye [Iodinated Contrast Media]     Sped heart reate up    Tetanus Toxoids Rash   Allergies as of 11/14/2021       Reactions   Penicillins Other (See Comments)   Unknown Did it involve swelling of the face/tongue/throat, SOB, or low BP? Unknown Did it involve sudden or severe rash/hives, skin peeling, or any reaction on the inside of your mouth or nose? Unknown Did you need to seek medical attention at a hospital or  doctor's office? Unknown When did it last happen?      childhood allergy If all above answers are "NO", may proceed with cephalosporin use.   Ivp Dye [iodinated Contrast Media]    Sped heart reate up    Tetanus Toxoids Rash        Medication List     STOP taking these medications    enalapril 10 MG tablet Commonly known as: VASOTEC   glipiZIDE 5 MG tablet Commonly known as: GLUCOTROL   hydrALAZINE 50 MG tablet Commonly known as: APRESOLINE   loratadine 10 MG tablet Commonly known as: CLARITIN       TAKE these medications    acetaminophen 500 MG tablet Commonly known as: TYLENOL Take 500-1,000 mg by mouth every 6 (six) hours as needed for mild pain or headache. Pain   atorvastatin 10 MG tablet Commonly known as: LIPITOR Take 10 mg by mouth at bedtime.   B-complex with vitamin C tablet Take 1 tablet by mouth daily.   buPROPion 150 MG 24 hr tablet Commonly known as: WELLBUTRIN XL Take 150 mg by mouth at bedtime.   cetirizine 10 MG tablet Commonly known as: ZYRTEC Take 10 mg by mouth daily as needed for allergies.   doxycycline 100 MG tablet Commonly known as: VIBRA-TABS Take 1 tablet (100 mg total) by mouth every 12 (twelve) hours for 5 days.   ferrous sulfate 325 (65 FE) MG EC tablet Take 325 mg by mouth daily with breakfast.   Fish Oil 1000 MG Caps Take 1,000 mg by mouth 2 (two) times daily. Afternoon & bedtime.   labetalol 300 MG tablet Commonly known as: NORMODYNE Take 600 mg by mouth 2 (two) times daily.   loperamide 2 MG capsule Commonly known as: IMODIUM TAKE ONE CAPSULE BY MOUTH TWO TIMES DAILY AS NEEDED FOR DIARRHEA OR LOOSE STOOLS What changed: See the new instructions.   mupirocin ointment 2 % Commonly known as: BACTROBAN Apply 1 Application topically 3 (three) times daily.   NIFEdipine 30 MG 24 hr tablet Commonly known as: ADALAT CC Take 1 tablet (30 mg  total) by mouth daily. What changed:  medication strength how much to take    omeprazole 20 MG capsule Commonly known as: PRILOSEC Take 1 capsule (20 mg total) by mouth daily.   pioglitazone 30 MG tablet Commonly known as: ACTOS Take 30 mg by mouth daily.   sertraline 100 MG tablet Commonly known as: ZOLOFT Take 100 mg by mouth at bedtime.   sodium bicarbonate 650 MG tablet Take 650 mg by mouth 2 (two) times daily.   Toujeo Max SoloStar 300 UNIT/ML Solostar Pen Generic drug: insulin glargine (2 Unit Dial) Inject 10 Units into the skin daily. What changed: how much to take   Uloric 80 MG Tabs Generic drug: Febuxostat Take 80 mg by mouth daily.   Vitamin D (Ergocalciferol) 1.25 MG (50000 UNIT) Caps capsule Commonly known as: DRISDOL Take 50,000 Units by mouth once a week. Sundays   Vitamin D 50 MCG (2000 UT) Caps Take 2,000 Units by mouth daily.        Procedures/Studies: DG Toe Great Left  Result Date: 11/11/2021 CLINICAL DATA:  Osteomyelitis EXAM: LEFT GREAT TOE COMPARISON:  None Available. FINDINGS: There is a minimally displaced anatomically aligned fracture of the distal tuft of the left great toe. No superimposed osseous erosions are identified. No abnormal periosteal reaction. 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 There is extensive soft tissue swelling of the great toe. Vascular calcifications are noted within the visualized left forefoot. IMPRESSION: Minimally displaced anatomically aligned fracture of the distal tuft of the left great toe. Extensive soft tissue swelling of the left great toe. No superimposed osseous erosion. Electronically Signed   By: Fidela Salisbury M.D.   On: 11/11/2021 21:15   DG Chest 2 View  Result Date: 11/11/2021 CLINICAL DATA:  Chest pain EXAM: CHEST - 2 VIEW COMPARISON:  Chest x-ray 07/03/2021 FINDINGS: The heart size and mediastinal contours are within normal limits. Both lungs are clear. The visualized skeletal structures are unremarkable. IMPRESSION: No active  cardiopulmonary disease. Electronically Signed   By: Ronney Asters M.D.   On: 11/11/2021 16:48     Subjective: Pt without complaints today.   Discharge Exam: Vitals:   11/13/21 2113 11/14/21 0605  BP: 131/64 123/62  Pulse: 81 81  Resp:  16  Temp:  97.8 F (36.6 C)  SpO2:  98%   Vitals:   11/13/21 1339 11/13/21 2038 11/13/21 2113 11/14/21 0605  BP: (!) 104/49 (!) 122/46 131/64 123/62  Pulse: 80 88 81 81  Resp: _0 Temp: 98.3 F (36.8 C) 97.9 F (36.6 C)  97.8 F (36.6 C)  TempSrc: Oral Oral  Oral  SpO2: 93% 97%  98%  Weight:      Height:        General: Pt is alert, awake, not in acute distress Cardiovascular: RRR, S1/S2 +, no rubs, no gallops Respiratory: CTA bilaterally, no wheezing, no rhonchi Abdominal: Soft, NT, ND, bowel sounds + Extremities: toe edema and cellulitis nearly completely resolved   The results of significant diagnostics from this hospitalization (including imaging, microbiology, ancillary and laboratory) are listed below for reference.     Microbiology: Recent Results (from the past 240 hour(s))  Resp Panel by RT-PCR (Flu A&B, Covid) Anterior Nasal Swab     Status: None   Collection Time: 11/11/21  8:28 PM   Specimen: Anterior Nasal Swab  Result Value Ref Range Status   SARS Coronavirus 2 by RT PCR NEGATIVE NEGATIVE Final  Comment: (NOTE) SARS-CoV-2 target nucleic acids are NOT DETECTED.  The SARS-CoV-2 RNA is generally detectable in upper respiratory specimens during the acute phase of infection. The lowest concentration of SARS-CoV-2 viral copies this assay can detect is 138 copies/mL. A negative result does not preclude SARS-Cov-2 infection and should not be used as the sole basis for treatment or other patient management decisions. A negative result may occur with  improper specimen collection/handling, submission of specimen other than nasopharyngeal swab, presence of viral mutation(s) within the areas targeted by this assay,  and inadequate number of viral copies(<138 copies/mL). A negative result must be combined with clinical observations, patient history, and epidemiological information. The expected result is Negative.  Fact Sheet for Patients:  EntrepreneurPulse.com.au  Fact Sheet for Healthcare Providers:  IncredibleEmployment.be  This test is no t yet approved or cleared by the [place] FDA and  has been authorized for detection and/or diagnosis of SARS-CoV-2 by FDA under an Emergency Use Authorization (EUA). This EUA will remain  in effect (meaning this test can be used) for the duration of the COVID-19 declaration under Section 564(b)(1) of the Act, 21 U.S.C.section 360bbb-3(b)(1), unless the authorization is terminated  or revoked sooner.       Influenza A by PCR NEGATIVE NEGATIVE Final   Influenza B by PCR NEGATIVE NEGATIVE Final    Comment: (NOTE) The Xpert Xpress SARS-CoV-2/FLU/RSV plus assay is intended as an aid in the diagnosis of influenza from Nasopharyngeal swab specimens and should not be used as a sole basis for treatment. Nasal washings and aspirates are unacceptable for Xpert Xpress SARS-CoV-2/FLU/RSV testing.  Fact Sheet for Patients: EntrepreneurPulse.com.au  Fact Sheet for Healthcare Providers: IncredibleEmployment.be  This test is not yet approved or cleared by the [place] FDA and has been authorized for detection and/or diagnosis of SARS-CoV-2 by FDA under an Emergency Use Authorization (EUA). This EUA will remain in effect (meaning this test can be used) for the duration of the COVID-19 declaration under Section 564(b)(1) of the Act, 21 U.S.C. section 360bbb-3(b)(1), unless the authorization is terminated or revoked.  Performed at [facility], [address], [place], [place] [address]   Blood Culture (routine x 2)     Status: None (Preliminary result)   Collection Time: 11/11/21   8:55 PM   Specimen: BLOOD RIGHT ARM  Result Value Ref Range Status   Specimen Description BLOOD RIGHT ARM  Final   Special Requests   Final    Normal BOTTLES DRAWN AEROBIC AND ANAEROBIC Blood Culture adequate volume   Culture   Final    NO GROWTH 3 DAYS Performed at [facility], [address], [place], [place] [address]    Report Status PENDING  Incomplete  Blood Culture (routine x 2)     Status: None (Preliminary result)   Collection Time: 11/11/21  8:55 PM   Specimen: Right Antecubital; Blood  Result Value Ref Range Status   Specimen Description RIGHT ANTECUBITAL  Final   Special Requests   Final    Normal BOTTLES DRAWN AEROBIC AND ANAEROBIC Blood Culture adequate volume   Culture   Final    NO GROWTH 3 DAYS Performed at [facility], [address], [place], [place] [address]    Report Status PENDING  Incomplete  Urine Culture     Status: Abnormal   Collection Time: 11/12/21  1:03 AM   Specimen: In/Out Cath Urine  Result Value Ref Range Status   Specimen Description   Final    IN/OUT CATH  URINE Performed at [facility], [address] [place], [place] [address]    Special Requests   Final    Normal Performed at [facility], [address], [place], [place] [address]    Culture MULTIPLE SPECIES PRESENT, SUGGEST RECOLLECTION (A)  Final   Report Status 11/13/2021 FINAL  Final  MRSA Next Gen by PCR, Nasal     Status: None   Collection Time: 11/12/21 11:14 AM   Specimen: Nasal Mucosa; Nasal Swab  Result Value Ref Range Status   MRSA by PCR Next Gen NOT DETECTED NOT DETECTED Final    Comment: (NOTE) The GeneXpert MRSA Assay (FDA approved for NASAL specimens only), is one component of a comprehensive MRSA colonization surveillance program. It is not intended to diagnose MRSA infection nor to guide or monitor treatment for MRSA infections. Test performance is not FDA approved in patients less than 80 years old. Performed at [facility], [address] [place],   [address]      Labs: BNP (last 3 results) No results for input(s): "BNP" in the last 8760 hours. Basic Metabolic Panel: Recent Labs  Lab 11/11/21 1745 11/12/21 0331 11/13/21 0954  NA 138 132* 137  K 5.1 3.5 4.2  CL 100 104 103  CO2 _0 GLUCOSE 201* 196* 185*  BUN 27* 25* 20  CREATININE 1.90* 1.48* 1.56*  CALCIUM 9.5 8.0* 8.2*  MG  --  1.9 1.9   Liver Function Tests: Recent Labs  Lab 11/12/21 0331 11/13/21 0954  AST 14* 16  ALT 14 13  ALKPHOS 49 47  BILITOT 1.4* 1.0  PROT 6.4* 6.1*  ALBUMIN 3.0* 2.7*   No results for input(s): "LIPASE", "AMYLASE" in the last 168 hours. No results for input(s): "AMMONIA" in the last 168 hours. CBC: Recent Labs  Lab 11/11/21 1745 11/12/21 0331 11/13/21 0954  WBC 8.3 4.8 3.9*  NEUTROABS  --  3.7 2.6  HGB 12.9* 10.7* 10.0*  HCT 39.2 32.1* 29.9*  MCV 89.5 88.7 87.9  PLT 292 236 234   Cardiac Enzymes: No results for input(s): "CKTOTAL", "CKMB", "CKMBINDEX", "TROPONINI" in the last 168 hours. BNP: Invalid input(s): "POCBNP" CBG: Recent Labs  Lab 11/13/21 0719 11/13/21 1107 11/13/21 1626 11/13/21 2223 11/14/21 0728  GLUCAP 130* 198* 203* 153* 147*   D-Dimer No results for input(s): "DDIMER" in the last 72 hours. Hgb A1c Recent Labs    11/12/21 0331  HGBA1C 6.8*   Lipid Profile No results for input(s): "CHOL", "HDL", "LDLCALC", "TRIG", "CHOLHDL", "LDLDIRECT" in the last 72 hours. Thyroid function studies Recent Labs    11/11/21 2055  TSH 0.916   Anemia work up No results for input(s): "VITAMINB12", "FOLATE", "FERRITIN", "TIBC", "IRON", "RETICCTPCT" in the last 72 hours. Urinalysis    Component Value Date/Time   COLORURINE YELLOW 11/12/2021 0103   APPEARANCEUR CLEAR 11/12/2021 0103   LABSPEC 1.013 11/12/2021 0103   PHURINE 5.0 11/12/2021 0103   GLUCOSEU NEGATIVE 11/12/2021 0103   HGBUR NEGATIVE 11/12/2021 0103   BILIRUBINUR NEGATIVE 11/12/2021 0103   KETONESUR 20 (A) 11/12/2021 0103   PROTEINUR 30  (A) 11/12/2021 0103   UROBILINOGEN 0.2 08/28/2011 1210   NITRITE NEGATIVE 11/12/2021 0103   LEUKOCYTESUR NEGATIVE 11/12/2021 0103   Sepsis Labs Recent Labs  Lab 11/11/21 1745 11/12/21 0331 11/13/21 0954  WBC 8.3 4.8 3.9*   Microbiology Recent Results (from the past 240 hour(s))  Resp Panel by RT-PCR (Flu A&B, Covid) Anterior Nasal Swab     Status: None  Collection Time: 11/11/21  8:28 PM   Specimen: Anterior Nasal Swab  Result Value Ref Range Status   SARS Coronavirus 2 by RT PCR NEGATIVE NEGATIVE Final    Comment: (NOTE) SARS-CoV-2 target nucleic acids are NOT DETECTED.  The SARS-CoV-2 RNA is generally detectable in upper respiratory specimens during the acute phase of infection. The lowest concentration of SARS-CoV-2 viral copies this assay can detect is 138 copies/mL. A negative result does not preclude SARS-Cov-2 infection and should not be used as the sole basis for treatment or other patient management decisions. A negative result may occur with  improper specimen collection/handling, submission of specimen other than nasopharyngeal swab, presence of viral mutation(s) within the areas targeted by this assay, and inadequate number of viral copies(<138 copies/mL). A negative result must be combined with clinical observations, patient history, and epidemiological information. The expected result is Negative.  Fact Sheet for Patients:  EntrepreneurPulse.com.au  Fact Sheet for Healthcare Providers:  IncredibleEmployment.be  This test is no t yet approved or cleared by the [place] FDA and  has been authorized for detection and/or diagnosis of SARS-CoV-2 by FDA under an Emergency Use Authorization (EUA). This EUA will remain  in effect (meaning this test can be used) for the duration of the COVID-19 declaration under Section 564(b)(1) of the Act, 21 U.S.C.section 360bbb-3(b)(1), unless the authorization is terminated  or revoked  sooner.       Influenza A by PCR NEGATIVE NEGATIVE Final   Influenza B by PCR NEGATIVE NEGATIVE Final    Comment: (NOTE) The Xpert Xpress SARS-CoV-2/FLU/RSV plus assay is intended as an aid in the diagnosis of influenza from Nasopharyngeal swab specimens and should not be used as a sole basis for treatment. Nasal washings and aspirates are unacceptable for Xpert Xpress SARS-CoV-2/FLU/RSV testing.  Fact Sheet for Patients: EntrepreneurPulse.com.au  Fact Sheet for Healthcare Providers: IncredibleEmployment.be  This test is not yet approved or cleared by the [place] FDA and has been authorized for detection and/or diagnosis of SARS-CoV-2 by FDA under an Emergency Use Authorization (EUA). This EUA will remain in effect (meaning this test can be used) for the duration of the COVID-19 declaration under Section 564(b)(1) of the Act, 21 U.S.C. section 360bbb-3(b)(1), unless the authorization is terminated or revoked.  Performed at [facility], [address], [place], [place] [address]   Blood Culture (routine x 2)     Status: None (Preliminary result)   Collection Time: 11/11/21  8:55 PM   Specimen: BLOOD RIGHT ARM  Result Value Ref Range Status   Specimen Description BLOOD RIGHT ARM  Final   Special Requests   Final    Normal BOTTLES DRAWN AEROBIC AND ANAEROBIC Blood Culture adequate volume   Culture   Final    NO GROWTH 3 DAYS Performed at [facility], [address], [place], [place] [address]    Report Status PENDING  Incomplete  Blood Culture (routine x 2)     Status: None (Preliminary result)   Collection Time: 11/11/21  8:55 PM   Specimen: Right Antecubital; Blood  Result Value Ref Range Status   Specimen Description RIGHT ANTECUBITAL  Final   Special Requests   Final    Normal BOTTLES DRAWN AEROBIC AND ANAEROBIC Blood Culture adequate volume   Culture   Final    NO GROWTH 3 DAYS Performed at [facility], [address] [place] [address],  [place], [place] [address]    Report Status PENDING  Incomplete  Urine Culture     Status: Abnormal  Collection Time: 11/12/21  1:03 AM   Specimen: In/Out Cath Urine  Result Value Ref Range Status   Specimen Description   Final    IN/OUT CATH URINE Performed at [facility], [address] [place], [place] [address]    Special Requests   Final    Normal Performed at [facility], [address], [place], [place] [address]    Culture MULTIPLE SPECIES PRESENT, SUGGEST RECOLLECTION (A)  Final   Report Status 11/13/2021 FINAL  Final  MRSA Next Gen by PCR, Nasal     Status: None   Collection Time: 11/12/21 11:14 AM   Specimen: Nasal Mucosa; Nasal Swab  Result Value Ref Range Status   MRSA by PCR Next Gen NOT DETECTED NOT DETECTED Final    Comment: (NOTE) The GeneXpert MRSA Assay (FDA approved for NASAL specimens only), is one component of a comprehensive MRSA colonization surveillance program. It is not intended to diagnose MRSA infection nor to guide or monitor treatment for MRSA infections. Test performance is not FDA approved in patients less than 50 years old. Performed at [facility], [address], [place], [place] [place] [id number]    Time coordinating discharge:  33 mins  SIGNED:  Irwin Brakeman, MD  [facility] 11/14/2021, 10:44 AM How to contact the [facility] Attending or Consulting provider [place] or covering provider during after hours [place], for this patient?  Check the care team in [facility] and look for a) attending/consulting [facility] provider listed and b) the [facility] team listed Log into [email or web address] and use [place]'s universal password to access. If you do not have the password, please contact the hospital operator. Locate the [facility] provider you are looking for under [facility] and page to a number that you can be directly reached. If you still have difficulty reaching the provider, please page the [facility] (Director on Call) for the Hospitalists listed on amion for  assistance.

## 2021-11-14 NOTE — Discharge Instructions (Addendum)
PLEASE [place] TOE WITH SOAP AND WATER AND KEEP CLEAN AND KEEP BANDAGES ON IT DAILY FOR NEXT 5 DAYS.      IMPORTANT INFORMATION: PAY CLOSE ATTENTION   PHYSICIAN DISCHARGE INSTRUCTIONS  Follow with Primary care provider  [facility], [place]  and other consultants as instructed by your Hospitalist Physician  [place] IF SYMPTOMS COME BACK, WORSEN OR NEW PROBLEM DEVELOPS   Please note: You were cared for by a hospitalist during your hospital stay. Every effort will be made to forward records to your primary care provider.  You can request that your primary care provider send for your hospital records if they have not received them.  Once you are discharged, your primary care physician will handle any further medical issues. Please note that NO REFILLS for any discharge medications will be authorized once you are discharged, as it is imperative that you return to your primary care physician (or establish a relationship with a primary care physician if you do not have one) for your post hospital discharge needs so that they can reassess your need for medications and monitor your lab values.  Please get a complete blood count and chemistry panel checked by your Primary MD at your next visit, and again as instructed by your Primary MD.  Get Medicines reviewed and adjusted: Please take all your medications with you for your next visit with your Primary MD  Laboratory/radiological data: Please request your Primary MD to go over all hospital tests and procedure/radiological results at the follow up, please ask your primary care provider to get all Hospital records sent to his/her office.  In some cases, they will be blood work, cultures and biopsy results pending at the time of your discharge. Please request that your primary care provider follow up on these results.  If you are diabetic, please bring your blood sugar readings with you to your follow up  appointment with primary care.    Please call and make your follow up appointments as soon as possible.    Also Note the following: If you experience worsening of your admission symptoms, develop shortness of breath, life threatening emergency, suicidal or homicidal thoughts you must seek medical attention immediately by calling 911 or calling your MD immediately  if symptoms less severe.  You must read complete instructions/literature along with all the possible adverse reactions/side effects for all the Medicines you take and that have been prescribed to you. Take any new Medicines after you have completely understood and accpet all the possible adverse reactions/side effects.   Do not drive when taking Pain medications or sleeping medications (Benzodiazepines)  Do not take more than prescribed Pain, Sleep and Anxiety Medications. It is not advisable to combine anxiety,sleep and pain medications without talking with your primary care practitioner  Special Instructions: If you have smoked or chewed Tobacco  in the last 2 yrs please stop smoking, stop any regular Alcohol  and or any Recreational drug use.  Wear Seat belts while driving.  Do not drive if taking any narcotic, mind altering or controlled substances or recreational drugs or alcohol.

## 2021-11-16 LAB — CULTURE, BLOOD (ROUTINE X 2)
Culture: NO GROWTH
Culture: NO GROWTH

## 2021-11-19 DIAGNOSIS — I10 Essential (primary) hypertension: Secondary | ICD-10-CM | POA: Diagnosis not present

## 2021-11-19 DIAGNOSIS — S98111S Complete traumatic amputation of right great toe, sequela: Secondary | ICD-10-CM | POA: Diagnosis not present

## 2021-11-19 DIAGNOSIS — A419 Sepsis, unspecified organism: Secondary | ICD-10-CM | POA: Diagnosis not present

## 2021-11-19 DIAGNOSIS — E1165 Type 2 diabetes mellitus with hyperglycemia: Secondary | ICD-10-CM | POA: Diagnosis not present

## 2021-11-19 DIAGNOSIS — N1831 Chronic kidney disease, stage 3a: Secondary | ICD-10-CM | POA: Diagnosis not present

## 2021-11-19 DIAGNOSIS — Z6824 Body mass index (BMI) 24.0-24.9, adult: Secondary | ICD-10-CM | POA: Diagnosis not present

## 2021-11-19 DIAGNOSIS — M869 Osteomyelitis, unspecified: Secondary | ICD-10-CM | POA: Diagnosis not present

## 2021-11-20 DIAGNOSIS — R000 Tachycardia, unspecified: Secondary | ICD-10-CM | POA: Diagnosis not present

## 2021-11-21 ENCOUNTER — Encounter (HOSPITAL_COMMUNITY): Payer: Self-pay | Admitting: Emergency Medicine

## 2021-11-21 ENCOUNTER — Emergency Department (HOSPITAL_COMMUNITY): Payer: Medicare Other

## 2021-11-21 ENCOUNTER — Other Ambulatory Visit: Payer: Self-pay

## 2021-11-21 ENCOUNTER — Emergency Department (HOSPITAL_COMMUNITY)
Admission: EM | Admit: 2021-11-21 | Discharge: 2021-11-22 | Disposition: A | Discharge status: Home / Self Care | Payer: Medicare Other | Attending: Emergency Medicine | Admitting: Emergency Medicine

## 2021-11-21 DIAGNOSIS — R531 Weakness: Secondary | ICD-10-CM | POA: Diagnosis not present

## 2021-11-21 DIAGNOSIS — W19XXXA Unspecified fall, initial encounter: Secondary | ICD-10-CM | POA: Insufficient documentation

## 2021-11-21 DIAGNOSIS — R6889 Other general symptoms and signs: Secondary | ICD-10-CM | POA: Diagnosis not present

## 2021-11-21 DIAGNOSIS — R739 Hyperglycemia, unspecified: Secondary | ICD-10-CM | POA: Insufficient documentation

## 2021-11-21 DIAGNOSIS — Z79899 Other long term (current) drug therapy: Secondary | ICD-10-CM | POA: Diagnosis not present

## 2021-11-21 DIAGNOSIS — I499 Cardiac arrhythmia, unspecified: Secondary | ICD-10-CM | POA: Diagnosis not present

## 2021-11-21 DIAGNOSIS — R791 Abnormal coagulation profile: Secondary | ICD-10-CM | POA: Diagnosis not present

## 2021-11-21 DIAGNOSIS — R42 Dizziness and giddiness: Secondary | ICD-10-CM | POA: Diagnosis not present

## 2021-11-21 DIAGNOSIS — R404 Transient alteration of awareness: Secondary | ICD-10-CM | POA: Diagnosis not present

## 2021-11-21 DIAGNOSIS — R519 Headache, unspecified: Secondary | ICD-10-CM | POA: Insufficient documentation

## 2021-11-21 DIAGNOSIS — E860 Dehydration: Secondary | ICD-10-CM | POA: Insufficient documentation

## 2021-11-21 DIAGNOSIS — D72829 Elevated white blood cell count, unspecified: Secondary | ICD-10-CM | POA: Diagnosis not present

## 2021-11-21 DIAGNOSIS — Z743 Need for continuous supervision: Secondary | ICD-10-CM | POA: Diagnosis not present

## 2021-11-21 DIAGNOSIS — S0990XA Unspecified injury of head, initial encounter: Secondary | ICD-10-CM | POA: Diagnosis not present

## 2021-11-21 LAB — CBG MONITORING, ED: Glucose-Capillary: 277 mg/dL — ABNORMAL HIGH (ref 70–99)

## 2021-11-21 MED ORDER — SODIUM CHLORIDE 0.9 % IV BOLUS
1000.0000 mL | Freq: Once | INTRAVENOUS | Status: AC
  Administered 2021-11-22: 1000 mL via INTRAVENOUS

## 2021-11-21 NOTE — ED Triage Notes (Addendum)
Pt BIB [facility] after pt reports a fall due to dizziness, reports hitting his head on the nightstand; pt reports recent falls, states he had 5 falls tonight and thinks he had LOC earlier tonight, unsure if he takes thinners, v/s WNL other than temp of 99.8, CBG 383; 500cc NS en route; pt denies pain but reports still feels dizzy

## 2021-11-21 NOTE — ED Provider Notes (Signed)
[facility] EMERGENCY DEPARTMENT Provider Note   CSN: [id number] Arrival date & time: 11/21/21  2318     History {Add pertinent medical, surgical, social history, OB history to HPI:1} Chief Complaint  Patient presents with   Fall   Level 5 caveat due to developmental delay [name] is a 71 y.o. male.  The history is provided by the patient.  Patient presents for being "swimmy headed " He reports over the past several hours every time he stands up he feels swimmy headed.  He reports he has  fallen several times.  He denied head injury on my evaluation (EMS reports he did hit his head) He is not on anticoagulation No chest pain, no abdominal pain.  No vomiting or diarrhea.  Denies any changes with medicines.  He reports he is compliant with meds  He does report mild headache Patient is unsure if he had LOC  Home Medications Prior to Admission medications   Medication Sig Start Date End Date Taking? Authorizing Provider  acetaminophen (TYLENOL) 500 MG tablet Take 500-1,000 mg by mouth every 6 (six) hours as needed for mild pain or headache. Pain    [provider]  atorvastatin (LIPITOR) 10 MG tablet Take 10 mg by mouth at bedtime. 09/21/17   [provider]  B Complex-C (B-COMPLEX WITH VITAMIN C) tablet Take 1 tablet by mouth daily.    [provider]  buPROPion (WELLBUTRIN XL) 150 MG 24 hr tablet Take 150 mg by mouth at bedtime.  08/29/12   [provider]  cetirizine (ZYRTEC) 10 MG tablet Take 10 mg by mouth daily as needed for allergies.    [provider]  Cholecalciferol (VITAMIN D) 50 MCG (2000 UT) CAPS Take 2,000 Units by mouth daily.     [provider]  ferrous sulfate 325 (65 FE) MG EC tablet Take 325 mg by mouth daily with breakfast.    [provider]  labetalol (NORMODYNE) 300 MG tablet Take 600 mg by mouth 2 (two) times daily. 11/08/21   [provider]  loperamide (IMODIUM) 2 MG capsule TAKE  ONE CAPSULE BY MOUTH TWO TIMES DAILY AS NEEDED FOR DIARRHEA OR LOOSE STOOLS Patient taking differently: Take 2 mg by mouth 2 (two) times daily as needed for diarrhea or loose stools. 09/02/21   Carlan, Chelsea L, NP  mupirocin ointment (BACTROBAN) 2 % Apply 1 Application topically 3 (three) times daily. 09/13/21   [provider]  NIFEdipine (ADALAT CC) 30 MG 24 hr tablet Take 1 tablet (30 mg total) by mouth daily. 11/14/21   Johnson, Clanford L, MD  Omega-3 Fatty Acids (FISH OIL) 1000 MG CAPS Take 1,000 mg by mouth 2 (two) times daily. Afternoon & bedtime.    [provider]  omeprazole (PRILOSEC) 20 MG capsule Take 1 capsule (20 mg total) by mouth daily. 12/22/20   Carlan, Chelsea L, NP  pioglitazone (ACTOS) 30 MG tablet Take 30 mg by mouth daily. 10/28/17   [provider]  sertraline (ZOLOFT) 100 MG tablet Take 100 mg by mouth at bedtime.  05/09/15   [provider]  sodium bicarbonate 650 MG tablet Take 650 mg by mouth 2 (two) times daily. 07/18/17   [provider]  TOUJEO MAX SOLOSTAR 300 UNIT/ML Solostar Pen Inject 10 Units into the skin daily. 11/14/21   Johnson, Clanford L, MD  ULORIC 80 MG TABS Take 80 mg by mouth daily.  04/21/15   [provider]  Vitamin D, Ergocalciferol, (DRISDOL)  1.25 MG (50000 UNIT) CAPS capsule Take 50,000 Units by mouth once a week. Sundays 07/21/21   [provider]      Allergies    Penicillins, Ivp dye [iodinated contrast media], and Tetanus toxoids    Review of Systems   Review of Systems  Constitutional:  Negative for fever.  Respiratory:  Negative for shortness of breath.   Gastrointestinal:  Negative for abdominal pain, diarrhea and vomiting.  Genitourinary:  Negative for dysuria.  Neurological:  Positive for headaches.    Physical Exam Updated Vital Signs BP (!) 96/38 (BP Location: Left Arm)   Pulse 91   Temp 97.8 F (36.6 C) (Oral)   Resp (!) 22   Ht 1.88 m ('6\' 2"'$ )   Wt 95.7 kg   SpO2  95%   BMI 27.09 kg/m  Physical Exam CONSTITUTIONAL: Elderly and disheveled HEAD: Normocephalic/atraumatic EYES: EOMI/PERRL, no nystagmus ENMT: Mucous membranes dry, edentulous NECK: supple no meningeal signs SPINE/BACK:entire spine nontender, no bruising/crepitance/stepoffs noted to spine CV: S1/S2 noted, no murmurs/rubs/gallops noted LUNGS: Lungs are clear to auscultation bilaterally, no apparent distress ABDOMEN: soft, nontender, no rebound or guarding, bowel sounds noted throughout abdomen GU:no cva tenderness NEURO: Pt is awake/alert, no facial droop.  No arm or leg drift EXTREMITIES: pulses normal/equal, full ROM All other extremities/joints palpated/ranged and nontender SKIN: warm, color normal PSYCH: no abnormalities of mood noted, alert and oriented to situation  ED Results / Procedures / Treatments   Labs (all labs ordered are listed, but only abnormal results are displayed) Labs Reviewed  ETHANOL  PROTIME-INR  APTT  CBC  DIFFERENTIAL  COMPREHENSIVE METABOLIC PANEL  RAPID URINE DRUG SCREEN, HOSP PERFORMED  URINALYSIS, ROUTINE W REFLEX MICROSCOPIC    EKG EKG Interpretation  Date/Time:  Sunday November 21 2021 23:38:23 EDT Ventricular Rate:  91 PR Interval:  140 QRS Duration: 102 QT Interval:  356 QTC Calculation: 438 R Axis:   -25 Text Interpretation: Sinus rhythm Borderline left axis deviation Abnormal R-wave progression, early transition Confirmed by Ripley Fraise [phone]) on 11/21/2021 11:46:07 PM  Radiology No results found.  Procedures Procedures  {Document cardiac monitor, telemetry assessment procedure when appropriate:1}  Medications Ordered in ED Medications  sodium chloride 0.9 % bolus 1,000 mL (has no administration in time range)    ED Course/ Medical Decision Making/ A&P                           Medical Decision Making Amount and/or Complexity of Data Reviewed Labs: ordered. Radiology: ordered.   This patient presents to the ED  for concern of dizziness, this involves an extensive number of treatment options, and is a complaint that carries with it a high risk of complications and morbidity.  The differential diagnosis includes but is not limited to CVA, orthostatic hypotension, electrolyte abnormality, urinary tract infection  Comorbidities that complicate the patient evaluation: Patient's presentation is complicated by their history of hypertension and diabetes  Social Determinants of Health: Patient's  developmental delay   increases the complexity of managing their presentation  Additional history obtained: Records reviewed previous admission documents patient with recent admission for sepsis and AKI  Lab Tests: I Ordered, and personally interpreted labs.  The pertinent results include:  ***  Imaging Studies ordered: I ordered imaging studies including CT scan head and X-ray chest   I independently visualized and interpreted imaging which showed *** I agree with the radiologist interpretation  Cardiac Monitoring: The patient was maintained on  a cardiac monitor.  I personally viewed and interpreted the cardiac monitor which showed an underlying rhythm of:  sinus rhythm  Medicines ordered and prescription drug management: I ordered medication including ***  for ***  Reevaluation of the patient after these medicines showed that the patient    {resolved/improved/worsened:23923::"improved"}  Test Considered: Patient is low risk / negative by ***, therefore do not feel that *** is indicated.  Critical Interventions:  ***  Consultations Obtained: I requested consultation with the {consultation:26851}, and discussed  findings as well as pertinent plan - they recommend: ***  Reevaluation: After the interventions noted above, I reevaluated the patient and found that they have :{resolved/improved/worsened:23923::"improved"}  Complexity of problems addressed: Patient's presentation is most consistent with   {DTOI:71245}  Disposition: After consideration of the diagnostic results and the patient's response to treatment,  I feel that the patent would benefit from {disposition:26850}.     {Document critical care time when appropriate:1} {Document review of labs and clinical decision tools ie heart score, Chads2Vasc2 etc:1}  {Document your independent review of radiology images, and any outside records:1} {Document your discussion with family members, caretakers, and with consultants:1} {Document social determinants of health affecting pt's care:1} {Document your decision making why or why not admission, treatments were needed:1} Final Clinical Impression(s) / ED Diagnoses Final diagnoses:  None    Rx / DC Orders ED Discharge Orders     None

## 2021-11-22 ENCOUNTER — Emergency Department (HOSPITAL_COMMUNITY): Payer: Medicare Other

## 2021-11-22 DIAGNOSIS — R531 Weakness: Secondary | ICD-10-CM | POA: Diagnosis not present

## 2021-11-22 DIAGNOSIS — R42 Dizziness and giddiness: Secondary | ICD-10-CM | POA: Diagnosis not present

## 2021-11-22 DIAGNOSIS — E860 Dehydration: Secondary | ICD-10-CM | POA: Diagnosis not present

## 2021-11-22 LAB — COMPREHENSIVE METABOLIC PANEL
ALT: 13 U/L (ref 0–44)
AST: 13 U/L — ABNORMAL LOW (ref 15–41)
Albumin: 2.7 g/dL — ABNORMAL LOW (ref 3.5–5.0)
Alkaline Phosphatase: 48 U/L (ref 38–126)
Anion gap: 7 (ref 5–15)
BUN: 30 mg/dL — ABNORMAL HIGH (ref 8–23)
CO2: 25 mmol/L (ref 22–32)
Calcium: 8.6 mg/dL — ABNORMAL LOW (ref 8.9–10.3)
Chloride: 102 mmol/L (ref 98–111)
Creatinine, Ser: 1.87 mg/dL — ABNORMAL HIGH (ref 0.61–1.24)
GFR, Estimated: 38 mL/min — ABNORMAL LOW (ref >60)
Glucose, Bld: 285 mg/dL — ABNORMAL HIGH (ref 70–99)
Potassium: 4.6 mmol/L (ref 3.5–5.1)
Sodium: 134 mmol/L — ABNORMAL LOW (ref 135–145)
Total Bilirubin: 0.8 mg/dL (ref 0.3–1.2)
Total Protein: 6.4 g/dL — ABNORMAL LOW (ref 6.5–8.1)

## 2021-11-22 LAB — RAPID URINE DRUG SCREEN, HOSP PERFORMED
Amphetamines: NOT DETECTED
Barbiturates: NOT DETECTED
Benzodiazepines: NOT DETECTED
Cocaine: NOT DETECTED
Opiates: NOT DETECTED
Tetrahydrocannabinol: NOT DETECTED

## 2021-11-22 LAB — URINALYSIS, ROUTINE W REFLEX MICROSCOPIC
Bacteria, UA: NONE SEEN
Bilirubin Urine: NEGATIVE
Glucose, UA: NEGATIVE mg/dL
Hgb urine dipstick: NEGATIVE
Ketones, ur: NEGATIVE mg/dL
Leukocytes,Ua: NEGATIVE
Nitrite: NEGATIVE
Protein, ur: 100 mg/dL — AB
Specific Gravity, Urine: 1.021 (ref 1.005–1.030)
pH: 5 (ref 5.0–8.0)

## 2021-11-22 LAB — DIFFERENTIAL
Abs Immature Granulocytes: 0.08 10*3/uL — ABNORMAL HIGH (ref 0.00–0.07)
Basophils Absolute: 0 10*3/uL (ref 0.0–0.1)
Basophils Relative: 0 %
Eosinophils Absolute: 0 10*3/uL (ref 0.0–0.5)
Eosinophils Relative: 0 %
Immature Granulocytes: 1 %
Lymphocytes Relative: 7 %
Lymphs Abs: 0.9 10*3/uL (ref 0.7–4.0)
Monocytes Absolute: 0.9 10*3/uL (ref 0.1–1.0)
Monocytes Relative: 8 %
Neutro Abs: 9.8 10*3/uL — ABNORMAL HIGH (ref 1.7–7.7)
Neutrophils Relative %: 84 %

## 2021-11-22 LAB — CBC
HCT: 27.4 % — ABNORMAL LOW (ref 39.0–52.0)
Hemoglobin: 9.1 g/dL — ABNORMAL LOW (ref 13.0–17.0)
MCH: 29.1 pg (ref 26.0–34.0)
MCHC: 33.2 g/dL (ref 30.0–36.0)
MCV: 87.5 fL (ref 80.0–100.0)
Platelets: 264 10*3/uL (ref 150–400)
RBC: 3.13 MIL/uL — ABNORMAL LOW (ref 4.22–5.81)
RDW: 13 % (ref 11.5–15.5)
WBC: 11.8 10*3/uL — ABNORMAL HIGH (ref 4.0–10.5)
nRBC: 0 % (ref 0.0–0.2)

## 2021-11-22 LAB — APTT: aPTT: 39 seconds — ABNORMAL HIGH (ref 24–36)

## 2021-11-22 LAB — ETHANOL: Alcohol, Ethyl (B): <10 mg/dL (ref <10)

## 2021-11-22 LAB — PROTIME-INR
INR: 1.2 (ref 0.8–1.2)
Prothrombin Time: 14.8 seconds (ref 11.4–15.2)

## 2021-11-22 MED ORDER — SODIUM CHLORIDE 0.9 % IV BOLUS
500.0000 mL | Freq: Once | INTRAVENOUS | Status: AC
  Administered 2021-11-22: 500 mL via INTRAVENOUS

## 2021-11-22 MED ORDER — SODIUM CHLORIDE 0.9 % IV BOLUS
1000.0000 mL | Freq: Once | INTRAVENOUS | Status: AC
  Administered 2021-11-22: 1000 mL via INTRAVENOUS

## 2021-11-22 NOTE — ED Notes (Signed)
Pt wheeled to waiting room. Pt verbalized understanding of discharge instructions.   

## 2021-11-22 NOTE — ED Notes (Signed)
Pt. Is unable to walk without an walker. Pt is very unstable. While walking with a walker he is pretty stable. Pt said while walking his head was hurting. Sat pt back in bed. Pt is requesting a walker for home.

## 2021-11-22 NOTE — Discharge Instructions (Addendum)
Please stop his labetalol and nifedipine for the next several days. If his blood pressure start to increase, please call his primary doctor for further management. Be sure that he stays hydrated.  Home health orders have been placed and you will hopefully get a call in the next several days

## 2021-11-22 NOTE — ED Notes (Signed)
Pt aware of order to collect urine specimen. Pt provided urinal but Pt unable to provide sample at this time.

## 2021-11-29 ENCOUNTER — Inpatient Hospital Stay (HOSPITAL_COMMUNITY)
Admission: EM | Admit: 2021-11-29 | Discharge: 2021-12-08 | DRG: 854 | Disposition: A | Discharge status: Home / Self Care | Payer: Medicare Other | Attending: Internal Medicine | Admitting: Internal Medicine

## 2021-11-29 ENCOUNTER — Encounter (HOSPITAL_COMMUNITY): Payer: Self-pay

## 2021-11-29 ENCOUNTER — Emergency Department (HOSPITAL_COMMUNITY): Payer: Medicare Other

## 2021-11-29 ENCOUNTER — Other Ambulatory Visit: Payer: Self-pay

## 2021-11-29 ENCOUNTER — Telehealth: Payer: Self-pay | Admitting: *Deleted

## 2021-11-29 DIAGNOSIS — L97529 Non-pressure chronic ulcer of other part of left foot with unspecified severity: Secondary | ICD-10-CM | POA: Diagnosis not present

## 2021-11-29 DIAGNOSIS — E1169 Type 2 diabetes mellitus with other specified complication: Secondary | ICD-10-CM | POA: Diagnosis present

## 2021-11-29 DIAGNOSIS — N1831 Chronic kidney disease, stage 3a: Secondary | ICD-10-CM | POA: Diagnosis not present

## 2021-11-29 DIAGNOSIS — N289 Disorder of kidney and ureter, unspecified: Secondary | ICD-10-CM | POA: Diagnosis present

## 2021-11-29 DIAGNOSIS — E8809 Other disorders of plasma-protein metabolism, not elsewhere classified: Secondary | ICD-10-CM | POA: Diagnosis not present

## 2021-11-29 DIAGNOSIS — M86672 Other chronic osteomyelitis, left ankle and foot: Secondary | ICD-10-CM | POA: Diagnosis not present

## 2021-11-29 DIAGNOSIS — R6510 Systemic inflammatory response syndrome (SIRS) of non-infectious origin without acute organ dysfunction: Secondary | ICD-10-CM | POA: Diagnosis not present

## 2021-11-29 DIAGNOSIS — E1152 Type 2 diabetes mellitus with diabetic peripheral angiopathy with gangrene: Secondary | ICD-10-CM | POA: Diagnosis not present

## 2021-11-29 DIAGNOSIS — E1165 Type 2 diabetes mellitus with hyperglycemia: Secondary | ICD-10-CM | POA: Diagnosis present

## 2021-11-29 DIAGNOSIS — J301 Allergic rhinitis due to pollen: Secondary | ICD-10-CM | POA: Diagnosis not present

## 2021-11-29 DIAGNOSIS — T148XXA Other injury of unspecified body region, initial encounter: Secondary | ICD-10-CM | POA: Diagnosis not present

## 2021-11-29 DIAGNOSIS — K760 Fatty (change of) liver, not elsewhere classified: Secondary | ICD-10-CM | POA: Diagnosis present

## 2021-11-29 DIAGNOSIS — M869 Osteomyelitis, unspecified: Secondary | ICD-10-CM | POA: Diagnosis not present

## 2021-11-29 DIAGNOSIS — R262 Difficulty in walking, not elsewhere classified: Secondary | ICD-10-CM | POA: Diagnosis present

## 2021-11-29 DIAGNOSIS — R739 Hyperglycemia, unspecified: Secondary | ICD-10-CM | POA: Diagnosis not present

## 2021-11-29 DIAGNOSIS — Z794 Long term (current) use of insulin: Secondary | ICD-10-CM | POA: Diagnosis not present

## 2021-11-29 DIAGNOSIS — M86172 Other acute osteomyelitis, left ankle and foot: Secondary | ICD-10-CM | POA: Diagnosis not present

## 2021-11-29 DIAGNOSIS — I96 Gangrene, not elsewhere classified: Secondary | ICD-10-CM | POA: Diagnosis not present

## 2021-11-29 DIAGNOSIS — M7989 Other specified soft tissue disorders: Secondary | ICD-10-CM | POA: Diagnosis not present

## 2021-11-29 DIAGNOSIS — E785 Hyperlipidemia, unspecified: Secondary | ICD-10-CM

## 2021-11-29 DIAGNOSIS — S98111A Complete traumatic amputation of right great toe, initial encounter: Secondary | ICD-10-CM

## 2021-11-29 DIAGNOSIS — E119 Type 2 diabetes mellitus without complications: Secondary | ICD-10-CM

## 2021-11-29 DIAGNOSIS — Z91041 Radiographic dye allergy status: Secondary | ICD-10-CM

## 2021-11-29 DIAGNOSIS — E1121 Type 2 diabetes mellitus with diabetic nephropathy: Secondary | ICD-10-CM

## 2021-11-29 DIAGNOSIS — F419 Anxiety disorder, unspecified: Secondary | ICD-10-CM | POA: Diagnosis present

## 2021-11-29 DIAGNOSIS — L089 Local infection of the skin and subcutaneous tissue, unspecified: Secondary | ICD-10-CM | POA: Diagnosis not present

## 2021-11-29 DIAGNOSIS — M8610 Other acute osteomyelitis, unspecified site: Secondary | ICD-10-CM

## 2021-11-29 DIAGNOSIS — E1159 Type 2 diabetes mellitus with other circulatory complications: Secondary | ICD-10-CM | POA: Diagnosis not present

## 2021-11-29 DIAGNOSIS — S92422A Displaced fracture of distal phalanx of left great toe, initial encounter for closed fracture: Secondary | ICD-10-CM | POA: Diagnosis not present

## 2021-11-29 DIAGNOSIS — Z800 Family history of malignant neoplasm of digestive organs: Secondary | ICD-10-CM

## 2021-11-29 DIAGNOSIS — K219 Gastro-esophageal reflux disease without esophagitis: Secondary | ICD-10-CM | POA: Diagnosis present

## 2021-11-29 DIAGNOSIS — M19072 Primary osteoarthritis, left ankle and foot: Secondary | ICD-10-CM | POA: Diagnosis present

## 2021-11-29 DIAGNOSIS — Z1152 Encounter for screening for COVID-19: Secondary | ICD-10-CM | POA: Diagnosis not present

## 2021-11-29 DIAGNOSIS — L03032 Cellulitis of left toe: Secondary | ICD-10-CM | POA: Diagnosis present

## 2021-11-29 DIAGNOSIS — R6520 Severe sepsis without septic shock: Secondary | ICD-10-CM | POA: Diagnosis not present

## 2021-11-29 DIAGNOSIS — W57XXXA Bitten or stung by nonvenomous insect and other nonvenomous arthropods, initial encounter: Secondary | ICD-10-CM | POA: Diagnosis present

## 2021-11-29 DIAGNOSIS — J029 Acute pharyngitis, unspecified: Secondary | ICD-10-CM | POA: Diagnosis present

## 2021-11-29 DIAGNOSIS — M109 Gout, unspecified: Secondary | ICD-10-CM | POA: Diagnosis present

## 2021-11-29 DIAGNOSIS — E1122 Type 2 diabetes mellitus with diabetic chronic kidney disease: Secondary | ICD-10-CM | POA: Diagnosis not present

## 2021-11-29 DIAGNOSIS — Z880 Allergy status to penicillin: Secondary | ICD-10-CM

## 2021-11-29 DIAGNOSIS — Z887 Allergy status to serum and vaccine status: Secondary | ICD-10-CM

## 2021-11-29 DIAGNOSIS — S92423A Displaced fracture of distal phalanx of unspecified great toe, initial encounter for closed fracture: Secondary | ICD-10-CM | POA: Diagnosis present

## 2021-11-29 DIAGNOSIS — T879 Unspecified complications of amputation stump: Secondary | ICD-10-CM | POA: Diagnosis not present

## 2021-11-29 DIAGNOSIS — A4181 Sepsis due to Enterococcus: Principal | ICD-10-CM | POA: Diagnosis present

## 2021-11-29 DIAGNOSIS — E7800 Pure hypercholesterolemia, unspecified: Secondary | ICD-10-CM | POA: Diagnosis present

## 2021-11-29 DIAGNOSIS — I10 Essential (primary) hypertension: Secondary | ICD-10-CM | POA: Diagnosis not present

## 2021-11-29 DIAGNOSIS — N179 Acute kidney failure, unspecified: Secondary | ICD-10-CM | POA: Diagnosis present

## 2021-11-29 DIAGNOSIS — Z86010 Personal history of colonic polyps: Secondary | ICD-10-CM

## 2021-11-29 DIAGNOSIS — B954 Other streptococcus as the cause of diseases classified elsewhere: Secondary | ICD-10-CM | POA: Diagnosis not present

## 2021-11-29 DIAGNOSIS — N183 Chronic kidney disease, stage 3 unspecified: Secondary | ICD-10-CM | POA: Diagnosis not present

## 2021-11-29 DIAGNOSIS — G8929 Other chronic pain: Secondary | ICD-10-CM | POA: Diagnosis not present

## 2021-11-29 DIAGNOSIS — A419 Sepsis, unspecified organism: Secondary | ICD-10-CM | POA: Diagnosis not present

## 2021-11-29 DIAGNOSIS — M009 Pyogenic arthritis, unspecified: Secondary | ICD-10-CM | POA: Diagnosis present

## 2021-11-29 DIAGNOSIS — M2548 Effusion, other site: Secondary | ICD-10-CM | POA: Diagnosis not present

## 2021-11-29 DIAGNOSIS — F32A Depression, unspecified: Secondary | ICD-10-CM | POA: Diagnosis present

## 2021-11-29 DIAGNOSIS — B964 Proteus (mirabilis) (morganii) as the cause of diseases classified elsewhere: Secondary | ICD-10-CM | POA: Diagnosis present

## 2021-11-29 DIAGNOSIS — Z89411 Acquired absence of right great toe: Secondary | ICD-10-CM

## 2021-11-29 DIAGNOSIS — Z79899 Other long term (current) drug therapy: Secondary | ICD-10-CM

## 2021-11-29 DIAGNOSIS — Z9181 History of falling: Secondary | ICD-10-CM

## 2021-11-29 DIAGNOSIS — I129 Hypertensive chronic kidney disease with stage 1 through stage 4 chronic kidney disease, or unspecified chronic kidney disease: Secondary | ICD-10-CM | POA: Diagnosis not present

## 2021-11-29 DIAGNOSIS — N189 Chronic kidney disease, unspecified: Secondary | ICD-10-CM | POA: Diagnosis not present

## 2021-11-29 DIAGNOSIS — M65872 Other synovitis and tenosynovitis, left ankle and foot: Secondary | ICD-10-CM | POA: Diagnosis not present

## 2021-11-29 DIAGNOSIS — R000 Tachycardia, unspecified: Secondary | ICD-10-CM | POA: Diagnosis not present

## 2021-11-29 DIAGNOSIS — R509 Fever, unspecified: Secondary | ICD-10-CM | POA: Diagnosis not present

## 2021-11-29 DIAGNOSIS — R053 Chronic cough: Secondary | ICD-10-CM | POA: Diagnosis present

## 2021-11-29 LAB — CBC WITH DIFFERENTIAL/PLATELET
Abs Immature Granulocytes: 0.26 10*3/uL — ABNORMAL HIGH (ref 0.00–0.07)
Basophils Absolute: 0.1 10*3/uL (ref 0.0–0.1)
Basophils Relative: 0 %
Eosinophils Absolute: 0.1 10*3/uL (ref 0.0–0.5)
Eosinophils Relative: 0 %
HCT: 26.5 % — ABNORMAL LOW (ref 39.0–52.0)
Hemoglobin: 9 g/dL — ABNORMAL LOW (ref 13.0–17.0)
Immature Granulocytes: 1 %
Lymphocytes Relative: 1 %
Lymphs Abs: 0.2 10*3/uL — ABNORMAL LOW (ref 0.7–4.0)
MCH: 28.8 pg (ref 26.0–34.0)
MCHC: 34 g/dL (ref 30.0–36.0)
MCV: 84.7 fL (ref 80.0–100.0)
Monocytes Absolute: 0.7 10*3/uL (ref 0.1–1.0)
Monocytes Relative: 4 %
Neutro Abs: 17.1 10*3/uL — ABNORMAL HIGH (ref 1.7–7.7)
Neutrophils Relative %: 94 %
Platelets: 346 10*3/uL (ref 150–400)
RBC: 3.13 MIL/uL — ABNORMAL LOW (ref 4.22–5.81)
RDW: 12.6 % (ref 11.5–15.5)
WBC: 18.3 10*3/uL — ABNORMAL HIGH (ref 4.0–10.5)
nRBC: 0 % (ref 0.0–0.2)

## 2021-11-29 LAB — COMPREHENSIVE METABOLIC PANEL
ALT: 20 U/L (ref 0–44)
AST: 18 U/L (ref 15–41)
Albumin: 2.6 g/dL — ABNORMAL LOW (ref 3.5–5.0)
Alkaline Phosphatase: 78 U/L (ref 38–126)
Anion gap: 11 (ref 5–15)
BUN: 31 mg/dL — ABNORMAL HIGH (ref 8–23)
CO2: 21 mmol/L — ABNORMAL LOW (ref 22–32)
Calcium: 8.4 mg/dL — ABNORMAL LOW (ref 8.9–10.3)
Chloride: 98 mmol/L (ref 98–111)
Creatinine, Ser: 1.77 mg/dL — ABNORMAL HIGH (ref 0.61–1.24)
GFR, Estimated: 41 mL/min — ABNORMAL LOW (ref >60)
Glucose, Bld: 252 mg/dL — ABNORMAL HIGH (ref 70–99)
Potassium: 3.9 mmol/L (ref 3.5–5.1)
Sodium: 130 mmol/L — ABNORMAL LOW (ref 135–145)
Total Bilirubin: 1.3 mg/dL — ABNORMAL HIGH (ref 0.3–1.2)
Total Protein: 6.9 g/dL (ref 6.5–8.1)

## 2021-11-29 LAB — LACTIC ACID, PLASMA
Lactic Acid, Venous: 1.8 mmol/L (ref 0.5–1.9)
Lactic Acid, Venous: 1.2 mmol/L (ref 0.5–1.9)

## 2021-11-29 LAB — APTT: aPTT: 36 seconds (ref 24–36)

## 2021-11-29 LAB — RESP PANEL BY RT-PCR (FLU A&B, COVID) ARPGX2
Influenza A by PCR: NEGATIVE
Influenza B by PCR: NEGATIVE
SARS Coronavirus 2 by RT PCR: NEGATIVE

## 2021-11-29 LAB — PROTIME-INR
INR: 1.3 — ABNORMAL HIGH (ref 0.8–1.2)
Prothrombin Time: 16.5 seconds — ABNORMAL HIGH (ref 11.4–15.2)

## 2021-11-29 MED ORDER — ACETAMINOPHEN 325 MG PO TABS
650.0000 mg | ORAL_TABLET | Freq: Four times a day (QID) | ORAL | Status: DC | PRN
  Administered 2021-11-30: 650 mg via ORAL
  Filled 2021-11-29: qty 2

## 2021-11-29 MED ORDER — ACETAMINOPHEN 650 MG RE SUPP: 650.0000 mg | Freq: Four times a day (QID) | RECTAL | Status: DC | PRN

## 2021-11-29 MED ORDER — VANCOMYCIN HCL 2000 MG/400ML IV SOLN
2000.0000 mg | Freq: Once | INTRAVENOUS | Status: AC
  Administered 2021-11-29: 2000 mg via INTRAVENOUS
  Filled 2021-11-29: qty 400

## 2021-11-29 MED ORDER — FERROUS SULFATE 325 (65 FE) MG PO TABS
325.0000 mg | ORAL_TABLET | Freq: Every day | ORAL | Status: DC
  Administered 2021-11-30 – 2021-12-08 (×9): 325 mg via ORAL
  Filled 2021-11-29 (×9): qty 1

## 2021-11-29 MED ORDER — HEPARIN SODIUM (PORCINE) 5000 UNIT/ML IJ SOLN
5000.0000 [IU] | Freq: Three times a day (TID) | INTRAMUSCULAR | Status: DC
  Administered 2021-11-30 – 2021-12-08 (×23): 5000 [IU] via SUBCUTANEOUS
  Filled 2021-11-29 (×25): qty 1

## 2021-11-29 MED ORDER — SODIUM CHLORIDE 0.9 % IV SOLN: INTRAVENOUS | Status: DC

## 2021-11-29 MED ORDER — LACTATED RINGERS IV SOLN: INTRAVENOUS | Status: DC

## 2021-11-29 MED ORDER — SODIUM BICARBONATE 650 MG PO TABS
650.0000 mg | ORAL_TABLET | Freq: Two times a day (BID) | ORAL | Status: DC
  Administered 2021-11-30 – 2021-12-08 (×18): 650 mg via ORAL
  Filled 2021-11-29 (×18): qty 1

## 2021-11-29 MED ORDER — PANTOPRAZOLE SODIUM 40 MG PO TBEC
40.0000 mg | DELAYED_RELEASE_TABLET | Freq: Every day | ORAL | Status: DC
  Administered 2021-11-30 – 2021-12-08 (×9): 40 mg via ORAL
  Filled 2021-11-29 (×9): qty 1

## 2021-11-29 MED ORDER — SERTRALINE HCL 50 MG PO TABS
100.0000 mg | ORAL_TABLET | Freq: Every day | ORAL | Status: DC
  Administered 2021-11-30 – 2021-12-07 (×9): 100 mg via ORAL
  Filled 2021-11-29 (×10): qty 2

## 2021-11-29 MED ORDER — LACTATED RINGERS IV BOLUS
1000.0000 mL | Freq: Once | INTRAVENOUS | Status: AC
  Administered 2021-11-29: 1000 mL via INTRAVENOUS

## 2021-11-29 MED ORDER — VANCOMYCIN HCL IN DEXTROSE 1-5 GM/200ML-% IV SOLN: 1000.0000 mg | Freq: Once | INTRAVENOUS | Status: DC

## 2021-11-29 MED ORDER — SODIUM CHLORIDE 0.9 % IV SOLN
2.0000 g | Freq: Once | INTRAVENOUS | Status: AC
  Administered 2021-11-29: 2 g via INTRAVENOUS
  Filled 2021-11-29: qty 12.5

## 2021-11-29 MED ORDER — SODIUM CHLORIDE 0.9 % IV SOLN: 2.0000 g | INTRAVENOUS | Status: DC

## 2021-11-29 MED ORDER — BUPROPION HCL ER (XL) 150 MG PO TB24
150.0000 mg | ORAL_TABLET | Freq: Every day | ORAL | Status: DC
  Administered 2021-11-30 – 2021-12-07 (×9): 150 mg via ORAL
  Filled 2021-11-29 (×9): qty 1

## 2021-11-29 MED ORDER — OXYCODONE HCL 5 MG PO TABS
5.0000 mg | ORAL_TABLET | ORAL | Status: DC | PRN
  Administered 2021-11-30 – 2021-12-08 (×6): 5 mg via ORAL
  Filled 2021-11-29 (×5): qty 1

## 2021-11-29 MED ORDER — ONDANSETRON HCL 4 MG/2ML IJ SOLN
4.0000 mg | Freq: Four times a day (QID) | INTRAMUSCULAR | Status: DC | PRN
  Administered 2021-11-30: 4 mg via INTRAVENOUS
  Filled 2021-11-29: qty 2

## 2021-11-29 MED ORDER — ONDANSETRON HCL 4 MG PO TABS: 4.0000 mg | ORAL_TABLET | Freq: Four times a day (QID) | ORAL | Status: DC | PRN

## 2021-11-29 MED ORDER — SODIUM CHLORIDE 0.9 % IV SOLN: 2.0000 g | Freq: Once | INTRAVENOUS | Status: DC

## 2021-11-29 MED ORDER — GLUCERNA SHAKE PO LIQD
237.0000 mL | Freq: Three times a day (TID) | ORAL | Status: DC
  Administered 2021-11-30 – 2021-12-08 (×14): 237 mL via ORAL
  Filled 2021-11-29 (×8): qty 237

## 2021-11-29 MED ORDER — SODIUM CHLORIDE 0.9 % IV SOLN
2.0000 g | Freq: Two times a day (BID) | INTRAVENOUS | Status: DC
  Administered 2021-11-30 – 2021-12-01 (×3): 2 g via INTRAVENOUS
  Filled 2021-11-29 (×3): qty 12.5

## 2021-11-29 MED ORDER — VANCOMYCIN HCL 1500 MG/300ML IV SOLN
1500.0000 mg | INTRAVENOUS | Status: DC
  Administered 2021-11-30: 1500 mg via INTRAVENOUS
  Filled 2021-11-29: qty 300

## 2021-11-29 MED ORDER — ATORVASTATIN CALCIUM 10 MG PO TABS
10.0000 mg | ORAL_TABLET | Freq: Every day | ORAL | Status: DC
  Administered 2021-11-30 – 2021-12-07 (×9): 10 mg via ORAL
  Filled 2021-11-29 (×9): qty 1

## 2021-11-29 MED ORDER — ACETAMINOPHEN 325 MG PO TABS
650.0000 mg | ORAL_TABLET | Freq: Once | ORAL | Status: AC
  Administered 2021-11-29: 650 mg via ORAL
  Filled 2021-11-29: qty 2

## 2021-11-29 MED ORDER — MORPHINE SULFATE (PF) 2 MG/ML IV SOLN: 2.0000 mg | INTRAVENOUS | Status: DC | PRN

## 2021-11-29 MED ORDER — METRONIDAZOLE 500 MG/100ML IV SOLN
500.0000 mg | Freq: Once | INTRAVENOUS | Status: AC
  Administered 2021-11-29: 500 mg via INTRAVENOUS
  Filled 2021-11-29: qty 100

## 2021-11-29 MED ORDER — HEPARIN SODIUM (PORCINE) 5000 UNIT/ML IJ SOLN: 5000.0000 [IU] | Freq: Three times a day (TID) | INTRAMUSCULAR | Status: DC

## 2021-11-29 NOTE — ED Provider Notes (Signed)
[facility] Provider Note   CSN: [id number] Arrival date & time: 11/29/21  1853     History  No chief complaint on file.   [name] is a 71 y.o. male.  HPI Patient brought in for feeling hot and hyperglycemia.  Reportedly has had cough.  Nausea vomiting diarrhea.  Heart rate of 120 for EMS.  Given around 400 cc of fluid by EMS.  No real abdominal pain.  States does have some chronic pain in his left great toe.  Found to be febrile upon arrival.  No known sick contacts.  States he does feel bad all over.    Home Medications Prior to Admission medications   Medication Sig Start Date End Date Taking? Authorizing Provider  acetaminophen (TYLENOL) 500 MG tablet Take 500-1,000 mg by mouth every 6 (six) hours as needed for mild pain or headache. Pain    [provider]  atorvastatin (LIPITOR) 10 MG tablet Take 10 mg by mouth at bedtime. 09/21/17   [provider]  B Complex-C (B-COMPLEX WITH VITAMIN C) tablet Take 1 tablet by mouth daily.    [provider]  buPROPion (WELLBUTRIN XL) 150 MG 24 hr tablet Take 150 mg by mouth at bedtime.  08/29/12   [provider]  cetirizine (ZYRTEC) 10 MG tablet Take 10 mg by mouth daily as needed for allergies.    [provider]  Cholecalciferol (VITAMIN D) 50 MCG (2000 UT) CAPS Take 2,000 Units by mouth daily.     [provider]  ferrous sulfate 325 (65 FE) MG EC tablet Take 325 mg by mouth daily with breakfast.    [provider]  loperamide (IMODIUM) 2 MG capsule TAKE ONE CAPSULE BY MOUTH TWO TIMES DAILY AS NEEDED FOR DIARRHEA OR LOOSE STOOLS Patient taking differently: Take 2 mg by mouth 2 (two) times daily as needed for diarrhea or loose stools. 09/02/21   Carlan, Chelsea L, NP  mupirocin ointment (BACTROBAN) 2 % Apply 1 Application topically 3 (three) times daily. 09/13/21   [provider]  Omega-3 Fatty Acids (FISH OIL) 1000 MG CAPS Take 1,000 mg by  mouth 2 (two) times daily. Afternoon & bedtime.    [provider]  omeprazole (PRILOSEC) 20 MG capsule Take 1 capsule (20 mg total) by mouth daily. 12/22/20   Carlan, Chelsea L, NP  pioglitazone (ACTOS) 30 MG tablet Take 30 mg by mouth daily. 10/28/17   [provider]  sertraline (ZOLOFT) 100 MG tablet Take 100 mg by mouth at bedtime.  05/09/15   [provider]  sodium bicarbonate 650 MG tablet Take 650 mg by mouth 2 (two) times daily. 07/18/17   [provider]  TOUJEO MAX SOLOSTAR 300 UNIT/ML Solostar Pen Inject 10 Units into the skin daily. 11/14/21   Johnson, Clanford L, MD  ULORIC 80 MG TABS Take 80 mg by mouth daily.  04/21/15   [provider]  Vitamin D, Ergocalciferol, (DRISDOL) 1.25 MG (50000 UNIT) CAPS capsule Take 50,000 Units by mouth once a week. Sundays 07/21/21   [provider]      Allergies    Penicillins, Ivp dye [iodinated contrast media], and Tetanus toxoids    Review of Systems   Review of Systems  Physical Exam Updated Vital Signs BP 116/69   Pulse (!) 106   Temp 98.8 F (37.1 C) (Oral)   Resp (!) 25   Ht '6\' 2"'$  (1.88 m)   Wt 95.7 kg   SpO2  99%   BMI 27.09 kg/m  Physical Exam Vitals and nursing note reviewed.  Eyes:     Pupils: Pupils are equal, round, and reactive to light.  Cardiovascular:     Rate and Rhythm: Regular rhythm. Tachycardia present.  Pulmonary:     Breath sounds: No wheezing.  Abdominal:     Tenderness: There is no abdominal tenderness.  Musculoskeletal:     Cervical back: Neck supple.     Comments: Some swelling and mild erythema of right great toe.  No drainage.  Neurological:     Mental Status: He is alert and oriented to person, place, and time.     ED Results / Procedures / Treatments   Labs (all labs ordered are listed, but only abnormal results are displayed) Labs Reviewed  COMPREHENSIVE METABOLIC PANEL - Abnormal; Notable for the following components:      Result Value    Sodium 130 (*)    CO2 21 (*)    Glucose, Bld 252 (*)    BUN 31 (*)    Creatinine, Ser 1.77 (*)    Calcium 8.4 (*)    Albumin 2.6 (*)    Total Bilirubin 1.3 (*)    GFR, Estimated 41 (*)    All other components within normal limits  CBC WITH DIFFERENTIAL/PLATELET - Abnormal; Notable for the following components:   WBC 18.3 (*)    RBC 3.13 (*)    Hemoglobin 9.0 (*)    HCT 26.5 (*)    Neutro Abs 17.1 (*)    Lymphs Abs 0.2 (*)    Abs Immature Granulocytes 0.26 (*)    All other components within normal limits  PROTIME-INR - Abnormal; Notable for the following components:   Prothrombin Time 16.5 (*)    INR 1.3 (*)    All other components within normal limits  RESP PANEL BY RT-PCR (FLU A&B, COVID) ARPGX2  CULTURE, BLOOD (ROUTINE X 2)  CULTURE, BLOOD (ROUTINE X 2)  LACTIC ACID, PLASMA  LACTIC ACID, PLASMA  APTT    EKG EKG Interpretation  Date/Time:  Monday November 29 2021 19:07:10 EDT Ventricular Rate:  134 PR Interval:  108 QRS Duration: 115 QT Interval:  330 QTC Calculation: 493 R Axis:   -60 Text Interpretation: Sinus tachycardia LAD, consider left anterior fascicular block Borderline T abnormalities, inferior leads Confirmed by Davonna Belling [phone]) on 11/29/2021 10:22:22 PM  Radiology DG Toe Great Left  Result Date: 11/29/2021 CLINICAL DATA:  Fever; osteomyelitis EXAM: LEFT GREAT TOE COMPARISON:  Radiographs 11/11/2021 FINDINGS: Acute comminuted displaced fracture of the base of the left first distal phalanx with extension into the IP joint. Question additional fracture of the medial head of the first proximal phalanx. Cortical irregularity about the medial aspect of the tuft of the first distal phalanx. The previously seen nondisplaced fracture of the tuft of the first distal phalanx is not well demonstrated though there is ill-defined densities and soft tissue gas about the distal tuft. Soft tissue swelling about the first toe. IMPRESSION: 1. Acute comminuted  intra-articular fracture of the left first distal phalanx. 2. Cortical irregularity about the tuft of the first distal phalanx with adjacent soft tissue gas suggestive of osteomyelitis. Electronically Signed   By: Placido Sou M.D.   On: 11/29/2021 20:06   DG Chest Port 1 View  Result Date: 11/29/2021 CLINICAL DATA:  Hyperglycemia EXAM: PORTABLE CHEST 1 VIEW COMPARISON:  Chest x-ray 11/22/2021 FINDINGS: The heart size and mediastinal contours are within normal limits. Both lungs are clear.  The visualized skeletal structures are unremarkable. IMPRESSION: No active disease. Electronically Signed   By: Ronney Asters M.D.   On: 11/29/2021 20:05    Procedures Procedures    Medications Ordered in ED Medications  lactated ringers infusion ( Intravenous New Bag/Given 11/29/21 2131)  vancomycin (VANCOREADY) IVPB 2000 mg/400 mL (2,000 mg Intravenous New Bag/Given 11/29/21 2112)  ceFEPIme (MAXIPIME) 2 g in sodium chloride 0.9 % 100 mL IVPB (has no administration in time range)  vancomycin (VANCOREADY) IVPB 1500 mg/300 mL (has no administration in time range)  metroNIDAZOLE (FLAGYL) IVPB 500 mg (0 mg Intravenous Stopped 11/29/21 2131)  lactated ringers bolus 1,000 mL (0 mLs Intravenous Stopped 11/29/21 2131)  ceFEPIme (MAXIPIME) 2 g in sodium chloride 0.9 % 100 mL IVPB (0 g Intravenous Stopped 11/29/21 2116)  acetaminophen (TYLENOL) tablet 650 mg (650 mg Oral Given 11/29/21 2023)    ED Course/ Medical Decision Making/ A&P                           Medical Decision Making Amount and/or Complexity of Data Reviewed Labs: ordered. Radiology: ordered.  Risk OTC drugs. Prescription drug management.   Patient's fever and tachycardia.  Has had several different reasons for potential infection.  Cough.  Nausea vomiting and diarrhea.  Potentially some dysuria.  We will start with some broad-spectrum antibiotics and fluid bolus.  We will give some Tylenol.  We will evaluate for sources of infection  including the left great toe.  Lab work has returned.  White count mildly elevated.  However lactic acid reassuring.  Creatinine appears near his baseline.  Chest x-ray reassuring however left great toenail has fracture and potentially osteomyelitis.  I think this is potentially the source of the infection.  Antibiotics have been given nonspecifically and should cover this.  Also has had cough but no pneumonia on x-ray and negative COVID testing.  Will discuss with hospitalist for admission.  Does not appear to be in severe sepsis at this time.  CRITICAL CARE Performed by: Davonna Belling Total critical care time: 30 minutes Critical care time was exclusive of separately billable procedures and treating other patients. Critical care was necessary to treat or prevent imminent or life-threatening deterioration. Critical care was time spent personally by me on the following activities: development of treatment plan with patient and/or surrogate as well as nursing, discussions with consultants, evaluation of patient's response to treatment, examination of patient, obtaining history from patient or surrogate, ordering and performing treatments and interventions, ordering and review of laboratory studies, ordering and review of radiographic studies, pulse oximetry and re-evaluation of patient's condition.         Final Clinical Impression(s) / ED Diagnoses Final diagnoses:  Sepsis without acute organ dysfunction, due to unspecified organism [facility])  Osteomyelitis of great toe of left foot [facility])    Rx / [place] Orders ED Discharge Orders     None         Davonna Belling, MD 11/29/21 2223

## 2021-11-29 NOTE — Telephone Encounter (Signed)
        Patient  visited [facility] ed on 11/22/2021  for dehydration   Telephone encounter attempt :  1st  A HIPAA compliant voice message was left requesting a return call.  Instructed patient to call back at [phone]. [name] [phone] [address] , [name] [address] Email : [name]

## 2021-11-29 NOTE — ED Triage Notes (Signed)
Called for feeling hot (99.8), CBG 300, pt had n/v, HR 120-130, 18LFA, 300-460m fluid with EMS

## 2021-11-29 NOTE — Progress Notes (Signed)
Pharmacy Antibiotic Note  [name] is a 71 y.o. male admitted on 11/29/2021 with sepsis. Pharmacy has been consulted for vancomycin and aztreonam dosing. Pt has PCN allergy but has tolerated cefazolin in the past so will switch to cefepime.  Plan: Vancomycin '2000mg'$  x1 then 1530m q24h - est AUC 18 Cefepime 2g IV x1 then q12h Follow Cr, Cx, LOT Vancomycin levels as needed  Height: '6\' 2"'$  (188 cm) Weight: 95.7 kg (211 lb) IBW/kg (Calculated) : 82.2  Temp (24hrs), Avg:101.1 F (38.4 C), Min:101.1 F (38.4 C), Max:101.1 F (38.4 C)  No results for input(s): "WBC", "CREATININE", "LATICACIDVEN", "VANCOTROUGH", "VANCOPEAK", "VANCORANDOM", "GENTTROUGH", "GENTPEAK", "GENTRANDOM", "TOBRATROUGH", "TOBRAPEAK", "TOBRARND", "AMIKACINPEAK", "AMIKACINTROU", "AMIKACIN" in the last 168 hours.  Estimated Creatinine Clearance: 42.7 mL/min (A) (by C-G formula based on SCr of 1.87 mg/dL (H)).    Allergies  Allergen Reactions   Penicillins Other (See Comments)    Unknown Did it involve swelling of the face/tongue/throat, SOB, or low BP? Unknown Did it involve sudden or severe rash/hives, skin peeling, or any reaction on the inside of your mouth or nose? Unknown Did you need to seek medical attention at a hospital or doctor's office? Unknown When did it last happen?      childhood allergy If all above answers are "NO", may proceed with cephalosporin use.    Ivp Dye [Iodinated Contrast Media]     Sped heart reate up    Tetanus Toxoids Rash     MArrie Senate PharmD, BCPS, BHigh Point Treatment CenterClinical Pharmacist Please check AMION for all MSaralandnumbers 11/29/2021

## 2021-11-30 ENCOUNTER — Inpatient Hospital Stay (HOSPITAL_COMMUNITY): Payer: Medicare Other

## 2021-11-30 ENCOUNTER — Telehealth: Payer: Self-pay | Admitting: *Deleted

## 2021-11-30 DIAGNOSIS — N183 Chronic kidney disease, stage 3 unspecified: Secondary | ICD-10-CM

## 2021-11-30 DIAGNOSIS — M869 Osteomyelitis, unspecified: Secondary | ICD-10-CM

## 2021-11-30 DIAGNOSIS — N179 Acute kidney failure, unspecified: Secondary | ICD-10-CM

## 2021-11-30 DIAGNOSIS — I10 Essential (primary) hypertension: Secondary | ICD-10-CM

## 2021-11-30 DIAGNOSIS — S98111A Complete traumatic amputation of right great toe, initial encounter: Secondary | ICD-10-CM

## 2021-11-30 DIAGNOSIS — R6520 Severe sepsis without septic shock: Secondary | ICD-10-CM

## 2021-11-30 DIAGNOSIS — F419 Anxiety disorder, unspecified: Secondary | ICD-10-CM

## 2021-11-30 DIAGNOSIS — R6510 Systemic inflammatory response syndrome (SIRS) of non-infectious origin without acute organ dysfunction: Secondary | ICD-10-CM | POA: Diagnosis not present

## 2021-11-30 DIAGNOSIS — A419 Sepsis, unspecified organism: Secondary | ICD-10-CM

## 2021-11-30 DIAGNOSIS — Z794 Long term (current) use of insulin: Secondary | ICD-10-CM

## 2021-11-30 DIAGNOSIS — T148XXA Other injury of unspecified body region, initial encounter: Secondary | ICD-10-CM

## 2021-11-30 DIAGNOSIS — L089 Local infection of the skin and subcutaneous tissue, unspecified: Secondary | ICD-10-CM | POA: Diagnosis present

## 2021-11-30 DIAGNOSIS — E1122 Type 2 diabetes mellitus with diabetic chronic kidney disease: Secondary | ICD-10-CM | POA: Diagnosis not present

## 2021-11-30 DIAGNOSIS — N1831 Chronic kidney disease, stage 3a: Secondary | ICD-10-CM

## 2021-11-30 DIAGNOSIS — E785 Hyperlipidemia, unspecified: Secondary | ICD-10-CM

## 2021-11-30 LAB — BLOOD CULTURE ID PANEL (REFLEXED) - BCID2

## 2021-11-30 LAB — CBC
HCT: 26.6 % — ABNORMAL LOW (ref 39.0–52.0)
Hemoglobin: 8.7 g/dL — ABNORMAL LOW (ref 13.0–17.0)
MCH: 28.2 pg (ref 26.0–34.0)
MCHC: 32.7 g/dL (ref 30.0–36.0)
MCV: 86.1 fL (ref 80.0–100.0)
Platelets: 345 10*3/uL (ref 150–400)
RBC: 3.09 MIL/uL — ABNORMAL LOW (ref 4.22–5.81)
RDW: 12.5 % (ref 11.5–15.5)
WBC: 21.5 10*3/uL — ABNORMAL HIGH (ref 4.0–10.5)
nRBC: 0 % (ref 0.0–0.2)

## 2021-11-30 LAB — COMPREHENSIVE METABOLIC PANEL
ALT: 18 U/L (ref 0–44)
AST: 13 U/L — ABNORMAL LOW (ref 15–41)
Albumin: 2.5 g/dL — ABNORMAL LOW (ref 3.5–5.0)
Alkaline Phosphatase: 74 U/L (ref 38–126)
Anion gap: 11 (ref 5–15)
BUN: 28 mg/dL — ABNORMAL HIGH (ref 8–23)
CO2: 22 mmol/L (ref 22–32)
Calcium: 8.5 mg/dL — ABNORMAL LOW (ref 8.9–10.3)
Chloride: 100 mmol/L (ref 98–111)
Creatinine, Ser: 1.51 mg/dL — ABNORMAL HIGH (ref 0.61–1.24)
GFR, Estimated: 49 mL/min — ABNORMAL LOW (ref >60)
Glucose, Bld: 232 mg/dL — ABNORMAL HIGH (ref 70–99)
Potassium: 3.8 mmol/L (ref 3.5–5.1)
Sodium: 133 mmol/L — ABNORMAL LOW (ref 135–145)
Total Bilirubin: 1 mg/dL (ref 0.3–1.2)
Total Protein: 6.5 g/dL (ref 6.5–8.1)

## 2021-11-30 LAB — GLUCOSE, CAPILLARY
Glucose-Capillary: 185 mg/dL — ABNORMAL HIGH (ref 70–99)
Glucose-Capillary: 200 mg/dL — ABNORMAL HIGH (ref 70–99)
Glucose-Capillary: 275 mg/dL — ABNORMAL HIGH (ref 70–99)
Glucose-Capillary: 226 mg/dL — ABNORMAL HIGH (ref 70–99)
Glucose-Capillary: 287 mg/dL — ABNORMAL HIGH (ref 70–99)

## 2021-11-30 LAB — C-REACTIVE PROTEIN: CRP: 22.9 mg/dL — ABNORMAL HIGH (ref <1.0)

## 2021-11-30 LAB — SEDIMENTATION RATE: Sed Rate: 107 mm/hr — ABNORMAL HIGH (ref 0–16)

## 2021-11-30 LAB — LACTIC ACID, PLASMA: Lactic Acid, Venous: 1.1 mmol/L (ref 0.5–1.9)

## 2021-11-30 LAB — MAGNESIUM: Magnesium: 1.9 mg/dL (ref 1.7–2.4)

## 2021-11-30 MED ORDER — GADOBUTROL 1 MMOL/ML IV SOLN
9.0000 mL | Freq: Once | INTRAVENOUS | Status: AC | PRN
  Administered 2021-11-30: 9 mL via INTRAVENOUS

## 2021-11-30 MED ORDER — LACTATED RINGERS IV SOLN: INTRAVENOUS | Status: DC

## 2021-11-30 MED ORDER — PERMETHRIN 1 % EX LIQD
Freq: Once | CUTANEOUS | Status: DC
  Filled 2021-11-30: qty 1

## 2021-11-30 MED ORDER — INSULIN ASPART 100 UNIT/ML IJ SOLN
0.0000 [IU] | Freq: Three times a day (TID) | INTRAMUSCULAR | Status: DC
  Administered 2021-11-30: 5 [IU] via SUBCUTANEOUS
  Administered 2021-11-30: 8 [IU] via SUBCUTANEOUS
  Administered 2021-11-30 – 2021-12-01 (×2): 3 [IU] via SUBCUTANEOUS
  Administered 2021-12-01: 8 [IU] via SUBCUTANEOUS
  Administered 2021-12-01: 5 [IU] via SUBCUTANEOUS
  Administered 2021-12-02 (×2): 2 [IU] via SUBCUTANEOUS
  Administered 2021-12-02 – 2021-12-03 (×2): 5 [IU] via SUBCUTANEOUS
  Administered 2021-12-03: 3 [IU] via SUBCUTANEOUS
  Administered 2021-12-03: 5 [IU] via SUBCUTANEOUS
  Administered 2021-12-04: 2 [IU] via SUBCUTANEOUS
  Administered 2021-12-04: 5 [IU] via SUBCUTANEOUS
  Administered 2021-12-04 – 2021-12-05 (×3): 3 [IU] via SUBCUTANEOUS
  Administered 2021-12-05: 8 [IU] via SUBCUTANEOUS
  Administered 2021-12-06: 5 [IU] via SUBCUTANEOUS
  Administered 2021-12-06: 3 [IU] via SUBCUTANEOUS
  Administered 2021-12-07: 2 [IU] via SUBCUTANEOUS
  Administered 2021-12-07: 8 [IU] via SUBCUTANEOUS
  Administered 2021-12-07: 5 [IU] via SUBCUTANEOUS
  Administered 2021-12-08: 3 [IU] via SUBCUTANEOUS
  Administered 2021-12-08: 5 [IU] via SUBCUTANEOUS
  Administered 2021-12-08: 2 [IU] via SUBCUTANEOUS
  Filled 2021-11-30: qty 1

## 2021-11-30 MED ORDER — SODIUM CHLORIDE 0.9 % IV SOLN: INTRAVENOUS | Status: DC

## 2021-11-30 MED ORDER — METRONIDAZOLE 500 MG/100ML IV SOLN
500.0000 mg | Freq: Two times a day (BID) | INTRAVENOUS | Status: DC
  Administered 2021-11-30 – 2021-12-01 (×3): 500 mg via INTRAVENOUS
  Filled 2021-11-30 (×3): qty 100

## 2021-11-30 MED ORDER — INSULIN ASPART 100 UNIT/ML IJ SOLN
0.0000 [IU] | Freq: Every day | INTRAMUSCULAR | Status: DC
  Administered 2021-11-30: 3 [IU] via SUBCUTANEOUS
  Administered 2021-12-02: 4 [IU] via SUBCUTANEOUS
  Administered 2021-12-06: 2 [IU] via SUBCUTANEOUS

## 2021-11-30 MED ORDER — PERMETHRIN 1 % EX LOTN: TOPICAL_LOTION | Freq: Two times a day (BID) | CUTANEOUS | Status: DC

## 2021-11-30 MED ORDER — FLORANEX PO PACK
1.0000 g | PACK | Freq: Three times a day (TID) | ORAL | Status: DC
  Administered 2021-11-30 – 2021-12-08 (×24): 1 g via ORAL
  Filled 2021-11-30 (×32): qty 1

## 2021-11-30 MED ORDER — LACTATED RINGERS IV BOLUS
1000.0000 mL | Freq: Once | INTRAVENOUS | Status: AC
  Administered 2021-11-30: 1000 mL via INTRAVENOUS

## 2021-11-30 MED ORDER — INSULIN DETEMIR 100 UNIT/ML ~~LOC~~ SOLN
10.0000 [IU] | Freq: Every day | SUBCUTANEOUS | Status: DC
  Administered 2021-11-30: 10 [IU] via SUBCUTANEOUS
  Filled 2021-11-30 (×2): qty 0.1

## 2021-11-30 NOTE — Assessment & Plan Note (Addendum)
-  Overnight 11/30/2021: Met sepsis criteria overnight with Tmax of 101.1, heart rate 135, RR 29, BP as low as 83/42, WBC of 21.5 Creatinine 1.7-1.51, lactic acid 1.8, 1.2, 1.1 Patient received bolus IV fluids, antibiotics was broadened to cefepime, vancomycin, and Flagyl  Current vitals: Blood pressure 121/76, pulse (!) 101, temperature 98.8 F (37.1 C), temperature source Oral, resp. rate 19, height $RemoveBe'6\' 5"'SEbPMZBvK$  (1.956 m), weight 95.4 kg, SpO2 95 %.   - Chest x-ray shows no evidence of infection - Most likely sources osteomyelitis of the left great toe - Continue vancomycin and Rocephin - Negative COVID - Blood cultures pending - Continue to monitor

## 2021-11-30 NOTE — Progress Notes (Addendum)
Per lab - 1 aerobic blood culture showing growth for gram + cocci. Amion sent to [facility]. Pt currently receiving antibiotics.

## 2021-11-30 NOTE — H&P (Signed)
History and Physical    Patient: [name]:[id number] DOB: April 18, 1950 DOA: 11/29/2021 DOS: the patient was seen and examined on 11/30/2021 PCP: [place], [place]  Patient coming from: Home  Chief Complaint: No chief complaint on file.  HPI: [name] is a 71 y.o. male with medical history significant of cognitive impairment, anxiety, diabetes mellitus type 2, GERD, hyperlipidemia, hypertension, osteomyelitis with right great toe amputation, renal insufficiency, and more presents the ED with a chief complaint of feeling hot and vomiting.  Patient reports he laid down around 5 PM.  He woke up from his nap and he was diaphoretic, flushed, and nauseous.  He reports 1 episode of vomiting.  It was nonbloody.  Patient reports he checked his sugar and it was 300.  He reports that his sugar is usually between 100 and 200.  Patient reports he takes 20 units of insulin at home.  He reports that he has had polyuria, and polydipsia today.  He denies any polyphasia.  He reports he has had a fever at home.  Tylenol has helped it.  He has had a headache and is felt dizzy.  He reports the headache is feeling "swimmy headed."  Patient reports that he had swelling in his left great toe for 2 or 3 months.  He reports that Sunday he had purulent drainage.  He had no trauma to the toe.  He does not have any pain in that toe.  Patient has no other complaints at this time.  Patient does not smoke, does not drink, does not use illicit drugs.  Patient is vaccinated for COVID.  Patient is full code. Review of Systems: As mentioned in the history of present illness. All other systems reviewed and are negative. Past Medical History:  Diagnosis Date   Anxiety    Arthritis    Colon polyps    Depression    Diabetes mellitus without complication (HCC)    Fatty liver    GERD (gastroesophageal reflux disease)    Gout    High cholesterol    History of echocardiogram 09/14/2011   EF >55%;  borderline concentric LVH;    History of stress test 06/21/2010   exercise; normal study   Hypertension    MET TEST 08/15/2011   moderate peak VO2 limitation at 66% predicted; mod cardiac impairment with low SV & low anaerobic threshold, mild chronotropic incompetence; low risk   Nausea    Osteomyelitis (HCC)    right great toe   Renal disorder    Renal insufficiency    Thrombocytopenia ([place])    PMH   Past Surgical History:  Procedure Laterality Date   AMPUTATION Right 02/13/2019   Procedure: RIGHT GREAT TOE AMPUTATION;  Surgeon: [name], MD;  Location: [facility];  Service: Orthopedics;  Laterality: Right;   CHOLECYSTECTOMY     COLONOSCOPY N/A 09/21/2012   Procedure: COLONOSCOPY;  Surgeon: Rogene Houston, MD;  [facility];  Service: Endoscopy;  Laterality: N/A;  730   COLONOSCOPY N/A 01/30/2019   rehman, eight 4-7 mm polyps in rectum in sigmoid and at splenic flexure, transverse colon and in cecum, one 28m polyp in recto-sigmoid colon, clip was placed, nine polyps total removed   ESOPHAGOGASTRODUODENOSCOPY  11/2005   normal mucosa and GE junction   HERNIA REPAIR     x 2   HIP SURGERY     Rt hip age 71  NASAL POLYP SURGERY      2-3 months  ago   POLYPECTOMY  01/30/2019   Procedure: POLYPECTOMY;  Surgeon: Rogene Houston, MD;  [facility];  Service: Endoscopy;;  proximal transverse colon, cecal   Social History:  reports that he has never smoked. He has never used smokeless tobacco. He reports that he does not drink alcohol and does not use drugs.  Allergies  Allergen Reactions   Penicillins Other (See Comments)    Unknown Did it involve swelling of the face/tongue/throat, SOB, or low BP? Unknown Did it involve sudden or severe rash/hives, skin peeling, or any reaction on the inside of your mouth or nose? Unknown Did you need to seek medical attention at a hospital or doctor's office? Unknown When did it last happen?      childhood allergy If all  above answers are "NO", may proceed with cephalosporin use.    Ivp Dye [Iodinated Contrast Media]     Sped heart reate up    Tetanus Toxoids Rash    Family History  Problem Relation Age of Onset   Colon cancer Brother     Prior to Admission medications   Medication Sig Start Date End Date Taking? Authorizing Provider  acetaminophen (TYLENOL) 500 MG tablet Take 500-1,000 mg by mouth every 6 (six) hours as needed for mild pain or headache. Pain    [provider]  atorvastatin (LIPITOR) 10 MG tablet Take 10 mg by mouth at bedtime. 09/21/17   [provider]  B Complex-C (B-COMPLEX WITH VITAMIN C) tablet Take 1 tablet by mouth daily.    [provider]  buPROPion (WELLBUTRIN XL) 150 MG 24 hr tablet Take 150 mg by mouth at bedtime.  08/29/12   [provider]  cetirizine (ZYRTEC) 10 MG tablet Take 10 mg by mouth daily as needed for allergies.    [provider]  Cholecalciferol (VITAMIN D) 50 MCG (2000 UT) CAPS Take 2,000 Units by mouth daily.     [provider]  ferrous sulfate 325 (65 FE) MG EC tablet Take 325 mg by mouth daily with breakfast.    [provider]  loperamide (IMODIUM) 2 MG capsule TAKE ONE CAPSULE BY MOUTH TWO TIMES DAILY AS NEEDED FOR DIARRHEA OR LOOSE STOOLS Patient taking differently: Take 2 mg by mouth 2 (two) times daily as needed for diarrhea or loose stools. 09/02/21   Carlan, Chelsea L, NP  mupirocin ointment (BACTROBAN) 2 % Apply 1 Application topically 3 (three) times daily. 09/13/21   [provider]  Omega-3 Fatty Acids (FISH OIL) 1000 MG CAPS Take 1,000 mg by mouth 2 (two) times daily. Afternoon & bedtime.    [provider]  omeprazole (PRILOSEC) 20 MG capsule Take 1 capsule (20 mg total) by mouth daily. 12/22/20   Carlan, Chelsea L, NP  pioglitazone (ACTOS) 30 MG tablet Take 30 mg by mouth daily. 10/28/17   [provider]  sertraline (ZOLOFT) 100 MG tablet Take 100 mg by mouth  at bedtime.  05/09/15   [provider]  sodium bicarbonate 650 MG tablet Take 650 mg by mouth 2 (two) times daily. 07/18/17   [provider]  TOUJEO MAX SOLOSTAR 300 UNIT/ML Solostar Pen Inject 10 Units into the skin daily. 11/14/21   Johnson, Clanford L, MD  ULORIC 80 MG TABS Take 80 mg by mouth daily.  04/21/15   [provider]  Vitamin D, Ergocalciferol, (DRISDOL) 1.25 MG (50000 UNIT) CAPS capsule Take 50,000 Units by mouth once a week. Sundays 07/21/21   [provider]    Physical Exam: Vitals:   11/29/21 2230 11/30/21 0330 11/30/21 0400 11/30/21 0430  BP: 128/60 (!) 104/41 (!) 96/56 (!) 83/42  Pulse:      Resp: (!) 23 (!) 21 (!) 29 17  Temp: 98.7 F (37.1 C)     TempSrc: Oral     SpO2:      Weight:      Height:       1.  General: Patient lying supine in bed,  no acute distress   2. Psychiatric: Alert and oriented x 3, mood and behavior normal for situation, pleasant and cooperative with exam   3. Neurologic: Speech and language are normal, face is symmetric, moves all 4 extremities voluntarily, at baseline without acute deficits on limited exam   4. HEENMT:  Head is atraumatic, normocephalic, pupils reactive to light, neck is supple, trachea is midline, mucous membranes are moist   5. Respiratory : Lungs are clear to auscultation bilaterally without wheezing, rhonchi, rales, no cyanosis, no increase in work of breathing or accessory muscle use   6. Cardiovascular : Heart rate normal, rhythm is regular, no murmurs, rubs or gallops, no peripheral edema, peripheral pulses palpated   7. Gastrointestinal:  Abdomen is soft, nondistended, nontender to palpation bowel sounds active, no masses or organomegaly palpated   8. Skin:  Multiple calluses on feet, dry skin, discolored skin on the plantar surface of the left   9.Musculoskeletal:  Multiple calluses on bilateral feet, digits amputated from the right lower extremity, swollen great left  toe  Data Reviewed: In the ED Temp 98.8-101.1, heart rate 101-20 35, respiratory rate 16-27, blood pressure 116/56-148/69, satting 95-97% Leukocytosis 18.3, hemoglobin stable at 9.0, platelets 346 Chemistry reveals a creatinine that is at his baseline 1.77, hyperglycemia at 252 Negative COVID Albumin is low at 2.6 Blood cultures pending Chest x-ray negative Left foot x-ray shows possible osteomyelitis Pain, bank, Flagyl started EKG shows sinus tach with a heart rate of 134, QTc 493 Admission requested for SIRS secondary to osteomyelitis  Assessment and Plan: * SIRS (systemic inflammatory response syndrome) (HCC) - Patient is febrile 101.1, tachycardic 135, tachypneic 27, leukocytosis 18.3 - Normal lactic acid - Creatinine at baseline - Mentation at baseline- cognitive impairment - Chest x-ray shows no evidence of infection - Most likely sources osteomyelitis of the left great toe - Continue vancomycin and Rocephin - Negative COVID - Blood cultures pending - Continue to monitor  Hyperlipidemia - Continue Lipitor - Heart healthy diet - Continue to monitor  Osteomyelitis (HCC) - X-ray of the left great toe shows acute comminuted intra-articular fracture of the first distal phalanx.  Cortical irregularity about the tuft of the first distal phalanx with adjacent soft tissue gas suggestive of osteomyelitis - Patient received Vanco cefepime and Flagyl in the ED - Continue vancomycin and Rocephin - ESR and CRP with morning labs - Consult general surgery - Continue to monitor  DM (diabetes mellitus) ([place]) - Takes 10 units of basal insulin at home - Sliding scale coverage here - At bedtime coverage - Heart healthy/carb modified diet - Monitor CBGs  Anxiety - Continue Zoloft      Advance Care Planning:   Code Status: Full Code   Consults: General surgery  Family Communication: No family at bedside  Severity of Illness: The appropriate patient status for this patient  is INPATIENT. Inpatient status is judged to be reasonable and necessary in order to provide the required intensity of service to ensure the patient's  safety. The patient's presenting symptoms, physical exam findings, and initial radiographic and laboratory data in the context of their chronic comorbidities is felt to place them at high risk for further clinical deterioration. Furthermore, it is not anticipated that the patient will be medically stable for discharge from the hospital within 2 midnights of admission.   * I certify that at the point of admission it is my clinical judgment that the patient will require inpatient hospital care spanning beyond 2 midnights from the point of admission due to high intensity of service, high risk for further deterioration and high frequency of surveillance required.*  Author: Rolla Plate, DO 11/30/2021 5:32 AM  For on call review [email or web address].

## 2021-11-30 NOTE — Assessment & Plan Note (Deleted)
-   Patient is febrile 101.1, tachycardic 135, tachypneic 27, leukocytosis 18.3 - Normal lactic acid - Creatinine at baseline - Mentation at baseline- cognitive impairment

## 2021-11-30 NOTE — Telephone Encounter (Signed)
     Patient  visit on 108/2023  at [facility] ed  was for dehydration   Have you been able to follow up with your primary care physician? Patient in hospital The patient was or was not able to obtain any needed medicine or equipment.  Are there diet recommendations that you are having difficulty following?  Patient expresses understanding of discharge instructions and education provided has no other needs at this time.   Ko Vaya [phone] [address] , [place] [address] Email : [name]

## 2021-11-30 NOTE — Assessment & Plan Note (Signed)
-   Stump clear ball of the foot necrotic wound may be consistent with osteomyelitis -Try to obtain MRI of the right foot

## 2021-11-30 NOTE — Progress Notes (Signed)
PROGRESS NOTE    Patient: [name]                            PCP: [facility]                    DOB: Jan 27, 1951            DOA: 11/29/2021 VZC:[id number]             DOS: 11/30/2021, 11:13 AM   LOS: 1 day   Date of Service: The patient was seen and examined on 11/30/2021  Subjective:   The patient was seen and examined this morning. Currently stable tachycardic with heart rate 101,  Met sepsis criteria overnight with Tmax of 101.1, heart rate 135, RR 29, BP as low as 83/42, WBC of 21.5 Creatinine 1.7-1.51, lactic acid 1.8, 1.2, 1.1 Patient received bolus IV fluids, antibiotics was broadened to cefepime, vancomycin, and Flagyl  He is currently stable awake alert following commands  Stating he lives alone independently with his dog at home. Reporting his toes amputated about a year ago but the ball of his foot never healed  Brief Narrative:   [name] is a 71 y.o. male with medical history significant of cognitive impairment, anxiety, diabetes mellitus type 2, GERD, hyperlipidemia, hypertension, osteomyelitis with right great toe amputation, renal insufficiency, and more presents the ED with a chief complaint of feeling hot and vomiting.  Patient reports he laid down around 5 PM.  He woke up from his nap and he was diaphoretic, flushed, and nauseous.  He reports 1 episode of vomiting.  It was nonbloody.  Patient reports he checked his sugar and it was 300.  He reports that his sugar is usually between 100 and 200.  Patient reports he takes 20 units of insulin at home.  He reports that he has had polyuria, and polydipsia today.  He denies any polyphasia.  He reports he has had a fever at home.  Tylenol has helped it.  He has had a headache and is felt dizzy.  He reports the headache is feeling "swimmy headed."  Patient reports that he had swelling in his left great toe for 2 or 3 months.  He reports that Sunday he had purulent drainage.  He had no  trauma to the toe.  He does not have any pain in that toe.  Patient has no other complaints at this time.   Patient does not smoke, does not drink, does not use illicit drugs.  Patient is vaccinated for COVID.  Patient is full code.  ED Temp 98.8-101.1, heart rate 101-20 35, respiratory rate 16-27, blood pressure 116/56-148/69, satting 95-97% Leukocytosis 18.3, hemoglobin stable at 9.0, platelets 346 Chemistry reveals a creatinine that is at his baseline 1.77, hyperglycemia at 252 Negative COVID Albumin is low at 2.6 Blood cultures pending Chest x-ray negative Left foot x-ray shows possible osteomyelitis Pain, bank, Flagyl started EKG shows sinus tach with a heart rate of 134, QTc 493 Admission requested for SIRS secondary to osteomyelitis    Assessment & Plan:   Principal Problem:   Sepsis ([place]) Active Problems:   Bedbug bite with infection   Hypertension   Anxiety   DM (diabetes mellitus) ([place])   Osteomyelitis ([place])   Amputated great toe of right foot ([place])   Stage 3a chronic kidney disease ([place])   AKI (acute kidney injury) ([place])   Hyperlipidemia  Assessment and Plan: * Sepsis ([place]) -Overnight 11/30/2021: Met sepsis criteria overnight with Tmax of 101.1, heart rate 135, RR 29, BP as low as 83/42, WBC of 21.5 Creatinine 1.7-1.51, lactic acid 1.8, 1.2, 1.1 Patient received bolus IV fluids, antibiotics was broadened to cefepime, vancomycin, and Flagyl  Current vitals: Blood pressure 121/76, pulse (!) 101, temperature 98.8 F (37.1 C), temperature source Oral, resp. rate 19, height 6' 5"  (1.956 m), weight 95.4 kg, SpO2 95 %.   - Chest x-ray shows no evidence of infection - Most likely sources osteomyelitis of the left great toe - Continue vancomycin and Rocephin - Negative COVID - Blood cultures pending - Continue to monitor   Bedbug bite with infection - Discussed with nursing staff, will continue with the protocol hygiene cleanup -At this point patient  needs an MRI unable to pursue telemetry he is cleared  -Attempting head to toe cleanup-close change -discard -Had to do hygiene treatment (?  With Nix -cannot rule out lice)   Hyperlipidemia - Continue Lipitor - Heart healthy diet - Continue to monitor  AKI (acute kidney injury) ([place]) - Monitoring BUN/creatinine closely -Trying to avoid all nephrotoxins unfortunately needing IV vancomycin at this point -Continue with IV fluids  Lab Results  Component Value Date   CREATININE 1.51 (H) 11/30/2021   CREATININE 1.77 (H) 11/29/2021   CREATININE 1.87 (H) 11/22/2021   GFR 41, 49,  Amputated great toe of right foot ([place]) - Stump clear ball of the foot necrotic wound may be consistent with osteomyelitis -Try to obtain MRI of the right foot  Osteomyelitis ([place]) - X-ray of the left great toe shows acute comminuted intra-articular fracture of the first distal phalanx.  Cortical irregularity about the tuft of the first distal phalanx with adjacent soft tissue gas suggestive of osteomyelitis - Patient received Vanco cefepime and Flagyl in the ED - Continue vancomycin and Rocephin - ESR and CRP with morning labs - Consult general surgery - Continue to monitor  -Try to obtain MRI of the foot to confirm right foot osteomyelitis (Unable to get MRI at this time as patient allegedly had bedbugs)  DM (diabetes mellitus) ([place]) - Takes 20 units of basal insulin at home -Reduce basal to 10 units while in the hospital - Sliding scale coverage here - At bedtime coverage - Heart healthy/carb modified diet - Monitor CBGs  Anxiety - Continue Zoloft  Hypertension - Currently stable, monitoring closely, for hypertension due to sepsis      ----------------------------------------------------------------------------------------------------------------------------------------------- Nutritional status:  The patient's BMI is: Body mass index is 24.94 kg/m. I agree with the assessment and plan  as outlined  Skin Assessment: I have examined the patient's skin and I agree with the wound assessment as performed by wound care team As outlined belowe:     --------------------------------------------------------------------------------------------------------------------------------------------------- Cultures; Blood Cultures x 2 >> NGT  ------------------------------------------------------------------------------------------------------------------------------------------------  DVT prophylaxis:  heparin injection 5,000 Units Start: 11/30/21 0600 SCDs Start: 11/29/21 2350   Code Status:   Code Status: Full Code  Family Communication: No family member present at bedside- attempt will be made to update daily The above findings and plan of care has been discussed with patient (and family)  in detail,  they expressed understanding and agreement of above. -Advance care planning has been discussed.   Admission status:   Status is: Inpatient Remains inpatient appropriate because: Needing IV antibiotics for sepsis, imaging for osteomyelitis and treatment, and consultants     Procedures:   No admission procedures for hospital encounter.  Antimicrobials:  Anti-infectives (From admission, onward)    Start     Dose/Rate Route Frequency Ordered Stop   11/30/21 2200  cefTRIAXone (ROCEPHIN) 2 g in sodium chloride 0.9 % 100 mL IVPB  Status:  Discontinued        2 g 200 mL/hr over 30 Minutes Intravenous Every 24 hours 11/29/21 2349 11/30/21 0751   11/30/21 2100  vancomycin (VANCOREADY) IVPB 1500 mg/300 mL        1,500 mg 150 mL/hr over 120 Minutes Intravenous Every 24 hours 11/29/21 2022     11/30/21 1000  ceFEPIme (MAXIPIME) 2 g in sodium chloride 0.9 % 100 mL IVPB        2 g 200 mL/hr over 30 Minutes Intravenous Every 12 hours 11/29/21 2022     11/30/21 0800  metroNIDAZOLE (FLAGYL) IVPB 500 mg        500 mg 100 mL/hr over 60 Minutes Intravenous Every 12 hours 11/30/21 0752      11/29/21 1945  ceFEPIme (MAXIPIME) 2 g in sodium chloride 0.9 % 100 mL IVPB        2 g 200 mL/hr over 30 Minutes Intravenous  Once 11/29/21 1932 11/29/21 2116   11/29/21 1945  vancomycin (VANCOREADY) IVPB 2000 mg/400 mL        2,000 mg 200 mL/hr over 120 Minutes Intravenous  Once 11/29/21 1932 11/30/21 0538   11/29/21 1930  aztreonam (AZACTAM) 2 g in sodium chloride 0.9 % 100 mL IVPB  Status:  Discontinued        2 g 200 mL/hr over 30 Minutes Intravenous  Once 11/29/21 1926 11/29/21 1931   11/29/21 1930  metroNIDAZOLE (FLAGYL) IVPB 500 mg        500 mg 100 mL/hr over 60 Minutes Intravenous  Once 11/29/21 1926 11/29/21 2131   11/29/21 1930  vancomycin (VANCOCIN) IVPB 1000 mg/200 mL premix  Status:  Discontinued        1,000 mg 200 mL/hr over 60 Minutes Intravenous  Once 11/29/21 1926 11/29/21 1930        Medication:   atorvastatin  10 mg Oral QHS   buPROPion  150 mg Oral QHS   feeding supplement (GLUCERNA SHAKE)  237 mL Oral TID BM   ferrous sulfate  325 mg Oral Q breakfast   heparin  5,000 Units Subcutaneous Q8H   insulin aspart  0-15 Units Subcutaneous TID WC   insulin aspart  0-5 Units Subcutaneous QHS   insulin detemir  10 Units Subcutaneous QHS   lactobacillus  1 g Oral TID WC   pantoprazole  40 mg Oral Daily   sertraline  100 mg Oral QHS   sodium bicarbonate  650 mg Oral BID    acetaminophen **OR** acetaminophen, morphine injection, ondansetron **OR** ondansetron (ZOFRAN) IV, oxyCODONE   Objective:   Vitals:   11/30/21 0500 11/30/21 0530 11/30/21 0800 11/30/21 0908  BP: (!) 105/39 (!) 96/38 121/76   Pulse: 97 89 (!) 101   Resp: (!) 22 (!) 24 19   Temp:  98.8 F (37.1 C)    TempSrc:  Oral    SpO2: 97% 95% 95%   Weight:    95.4 kg  Height:    6' 5"  (1.956 m)    Intake/Output Summary (Last 24 hours) at 11/30/2021 1113 Last data filed at 11/30/2021 1006 Gross per 24 hour  Intake 640 ml  Output --  Net 640 ml   Filed Weights   11/29/21 1907 11/30/21  0908  Weight: 95.7 kg  95.4 kg     Examination:   Physical Exam  Constitution:  Alert, cooperative, no distress,  Appears calm and comfortable  Psychiatric:   Normal and stable mood and affect, cognition intact,   HEENT:        Normocephalic, PERRL, otherwise with in Normal limits  Chest:         Chest symmetric Cardio vascular:  S1/S2, RRR, No murmure, No Rubs or Gallops  pulmonary: Clear to auscultation bilaterally, respirations unlabored, negative wheezes / crackles Abdomen: Soft, non-tender, non-distended, bowel sounds,no masses, no organomegaly Muscular skeletal: Limited exam - in bed, able to move all 4 extremities,   Neuro: CNII-XII intact. , normal motor and sensation, reflexes intact  Extremities: Left great toe amputated, ball of the foot positive for necrotic wound with minimal drainage  no pitting edema lower extremities, +2 pulses  Skin: Dry, warm to touch, negative for any Rashes, No open wounds Wounds: per nursing documentation   ------------------------------------------------------------------------------------------------------------------------------------------    LABs:     Latest Ref Rng & Units 11/30/2021    4:38 AM 11/29/2021    7:10 PM 11/22/2021   12:27 AM  CBC  WBC 4.0 - 10.5 K/uL 21.5  18.3  11.8   Hemoglobin 13.0 - 17.0 g/dL 8.7  9.0  9.1   Hematocrit 39.0 - 52.0 % 26.6  26.5  27.4   Platelets 150 - 400 K/uL 345  346  264       Latest Ref Rng & Units 11/30/2021    4:38 AM 11/29/2021    7:10 PM 11/22/2021   12:27 AM  CMP  Glucose 70 - 99 mg/dL 232  252  285   BUN 8 - 23 mg/dL 28  31  30    Creatinine 0.61 - 1.24 mg/dL 1.51  1.77  1.87   Sodium 135 - 145 mmol/L 133  130  134   Potassium 3.5 - 5.1 mmol/L 3.8  3.9  4.6   Chloride 98 - 111 mmol/L 100  98  102   CO2 22 - 32 mmol/L 22  21  25    Calcium 8.9 - 10.3 mg/dL 8.5  8.4  8.6   Total Protein 6.5 - 8.1 g/dL 6.5  6.9  6.4   Total Bilirubin 0.3 - 1.2 mg/dL 1.0  1.3  0.8   Alkaline Phos 38 -  126 U/L 74  78  48   AST 15 - 41 U/L 13  18  13    ALT 0 - 44 U/L 18  20  13         Micro Results Recent Results (from the past 240 hour(s))  Resp Panel by RT-PCR (Flu A&B, Covid) Anterior Nasal Swab     Status: None   Collection Time: 11/29/21  7:18 PM   Specimen: Anterior Nasal Swab  Result Value Ref Range Status   SARS Coronavirus 2 by RT PCR NEGATIVE NEGATIVE Final    Comment: (NOTE) SARS-CoV-2 target nucleic acids are NOT DETECTED.  The SARS-CoV-2 RNA is generally detectable in upper respiratory specimens during the acute phase of infection. The lowest concentration of SARS-CoV-2 viral copies this assay can detect is 138 copies/mL. A negative result does not preclude SARS-Cov-2 infection and should not be used as the sole basis for treatment or other patient management decisions. A negative result may occur with  improper specimen collection/handling, submission of specimen other than nasopharyngeal swab, presence of viral mutation(s) within the areas targeted by this assay, and inadequate number of viral copies(<138 copies/mL).  A negative result must be combined with clinical observations, patient history, and epidemiological information. The expected result is Negative.  Fact Sheet for Patients:  EntrepreneurPulse.com.au  Fact Sheet for Healthcare Providers:  IncredibleEmployment.be  This test is no t yet approved or cleared by the [place] FDA and  has been authorized for detection and/or diagnosis of SARS-CoV-2 by FDA under an Emergency Use Authorization (EUA). This EUA will remain  in effect (meaning this test can be used) for the duration of the COVID-19 declaration under Section 564(b)(1) of the Act, 21 U.S.C.section 360bbb-3(b)(1), unless the authorization is terminated  or revoked sooner.       Influenza A by PCR NEGATIVE NEGATIVE Final   Influenza B by PCR NEGATIVE NEGATIVE Final    Comment: (NOTE) The Xpert  Xpress SARS-CoV-2/FLU/RSV plus assay is intended as an aid in the diagnosis of influenza from Nasopharyngeal swab specimens and should not be used as a sole basis for treatment. Nasal washings and aspirates are unacceptable for Xpert Xpress SARS-CoV-2/FLU/RSV testing.  Fact Sheet for Patients: EntrepreneurPulse.com.au  Fact Sheet for Healthcare Providers: IncredibleEmployment.be  This test is not yet approved or cleared by the [place] FDA and has been authorized for detection and/or diagnosis of SARS-CoV-2 by FDA under an Emergency Use Authorization (EUA). This EUA will remain in effect (meaning this test can be used) for the duration of the COVID-19 declaration under Section 564(b)(1) of the Act, 21 U.S.C. section 360bbb-3(b)(1), unless the authorization is terminated or revoked.  Performed at [facility], [address] [place], [place] [address]     Radiology Reports DG Toe Great Left  Result Date: 11/29/2021 CLINICAL DATA:  Fever; osteomyelitis EXAM: LEFT GREAT TOE COMPARISON:  Radiographs 11/11/2021 FINDINGS: Acute comminuted displaced fracture of the base of the left first distal phalanx with extension into the IP joint. Question additional fracture of the medial head of the first proximal phalanx. Cortical irregularity about the medial aspect of the tuft of the first distal phalanx. The previously seen nondisplaced fracture of the tuft of the first distal phalanx is not well demonstrated though there is ill-defined densities and soft tissue gas about the distal tuft. Soft tissue swelling about the first toe. IMPRESSION: 1. Acute comminuted intra-articular fracture of the left first distal phalanx. 2. Cortical irregularity about the tuft of the first distal phalanx with adjacent soft tissue gas suggestive of osteomyelitis. Electronically Signed   By: Placido Sou M.D.   On: 11/29/2021 20:06   DG Chest Port 1 View  Result Date:  11/29/2021 CLINICAL DATA:  Hyperglycemia EXAM: PORTABLE CHEST 1 VIEW COMPARISON:  Chest x-ray 11/22/2021 FINDINGS: The heart size and mediastinal contours are within normal limits. Both lungs are clear. The visualized skeletal structures are unremarkable. IMPRESSION: No active disease. Electronically Signed   By: Ronney Asters M.D.   On: 11/29/2021 20:05    SIGNED: Deatra James, MD, FHM. [facility],  Pager (please use [email or web address] to page/text) Please use Epic Secure Chat for non-urgent communication (7AM-7PM)  If 7PM-7AM, please contact night-coverage [email or web address], 11/30/2021, 11:13 AM

## 2021-11-30 NOTE — Assessment & Plan Note (Signed)
Continue Zoloft 

## 2021-11-30 NOTE — Assessment & Plan Note (Signed)
-   Currently stable, monitoring closely, for hypertension due to sepsis

## 2021-11-30 NOTE — Hospital Course (Addendum)
[name] is a 71 y.o. male with medical history significant of cognitive impairment, anxiety, diabetes mellitus type 2, GERD, hyperlipidemia, hypertension, osteomyelitis with right great toe amputation, renal insufficiency, and more presents the ED with a chief complaint of feeling hot and vomiting.  Patient reports he laid down around 5 PM.  He woke up from his nap and he was diaphoretic, flushed, and nauseous.  He reports 1 episode of vomiting.  It was nonbloody.  Patient reports he checked his sugar and it was 300.  He reports that his sugar is usually between 100 and 200.  Patient reports he takes 20 units of insulin at home.  He reports that he has had polyuria, and polydipsia today.  He denies any polyphasia.  He reports he has had a fever at home.  Tylenol has helped it.  He has had a headache and is felt dizzy.  He reports the headache is feeling "swimmy headed."  Patient reports that he had swelling in his left great toe for 2 or 3 months.  He reports that Sunday he had purulent drainage.  He had no trauma to the toe.  He does not have any pain in that toe.  Patient has no other complaints at this time.   Patient does not smoke, does not drink, does not use illicit drugs.  Patient is vaccinated for COVID.  Patient is full code.  ED Temp 98.8-101.1, heart rate 101-20 35, respiratory rate 16-27, blood pressure 116/56-148/69, satting 95-97% Leukocytosis 18.3, hemoglobin stable at 9.0, platelets 346 Chemistry reveals a creatinine that is at his baseline 1.77, hyperglycemia at 252 Negative COVID Albumin is low at 2.6 Blood cultures pending Chest x-ray negative Left foot x-ray shows possible osteomyelitis Pain, bank, Flagyl started EKG shows sinus tach with a heart rate of 134, QTc 493 Admission requested for SIRS secondary to osteomyelitis

## 2021-11-30 NOTE — Progress Notes (Addendum)
PHARMACY - PHYSICIAN COMMUNICATION CRITICAL VALUE ALERT - BLOOD CULTURE IDENTIFICATION (BCID)  [name] is an 71 y.o. male who presented to [place] on 11/29/2021   Assessment:  group B strep bacteremia/osteomyelitis   Name of physician (or Provider) Contacted: Dr. Roger Shelter   Current antibiotics: Vanco/Cefepime/Flagyl  Changes to prescribed antibiotics recommended:  Consider de-escalation to cefazolin since patient has penicillin allergy. Current antibiotics (vanco) are likely to cover isolated organism. Consider de-escalation soon.   Results for orders placed or performed during the hospital encounter of 11/29/21  Blood Culture ID Panel (Reflexed) (Collected: 11/29/2021  7:30 PM)  Result Value Ref Range   Enterococcus faecalis NOT DETECTED NOT DETECTED   Enterococcus Faecium NOT DETECTED NOT DETECTED   Listeria monocytogenes NOT DETECTED NOT DETECTED   Staphylococcus species NOT DETECTED NOT DETECTED   Staphylococcus aureus (BCID) NOT DETECTED NOT DETECTED   Staphylococcus epidermidis NOT DETECTED NOT DETECTED   Staphylococcus lugdunensis NOT DETECTED NOT DETECTED   Streptococcus species DETECTED (A) NOT DETECTED   Streptococcus agalactiae DETECTED (A) NOT DETECTED   Streptococcus pneumoniae NOT DETECTED NOT DETECTED   Streptococcus pyogenes NOT DETECTED NOT DETECTED   A.calcoaceticus-baumannii NOT DETECTED NOT DETECTED   Bacteroides fragilis NOT DETECTED NOT DETECTED   Enterobacterales NOT DETECTED NOT DETECTED   Enterobacter cloacae complex NOT DETECTED NOT DETECTED   Escherichia coli NOT DETECTED NOT DETECTED   Klebsiella aerogenes NOT DETECTED NOT DETECTED   Klebsiella oxytoca NOT DETECTED NOT DETECTED   Klebsiella pneumoniae NOT DETECTED NOT DETECTED   Proteus species NOT DETECTED NOT DETECTED   Salmonella species NOT DETECTED NOT DETECTED   Serratia marcescens NOT DETECTED NOT DETECTED   Haemophilus influenzae NOT DETECTED NOT DETECTED   Neisseria  meningitidis NOT DETECTED NOT DETECTED   Pseudomonas aeruginosa NOT DETECTED NOT DETECTED   Stenotrophomonas maltophilia NOT DETECTED NOT DETECTED   Candida albicans NOT DETECTED NOT DETECTED   Candida auris NOT DETECTED NOT DETECTED   Candida glabrata NOT DETECTED NOT DETECTED   Candida krusei NOT DETECTED NOT DETECTED   Candida parapsilosis NOT DETECTED NOT DETECTED   Candida tropicalis NOT DETECTED NOT DETECTED   Cryptococcus neoformans/gattii NOT DETECTED NOT DETECTED    [name] 11/30/2021  5:08 PM

## 2021-11-30 NOTE — Assessment & Plan Note (Signed)
-   Discussed with nursing staff, will continue with the protocol hygiene cleanup -At this point patient needs an MRI unable to pursue telemetry he is cleared  -Attempting head to toe cleanup-close change -discard -Had to do hygiene treatment (?  With Nix -cannot rule out lice)

## 2021-11-30 NOTE — Assessment & Plan Note (Addendum)
-   Takes 20 units of basal insulin at home -Reduce basal to 10 units while in the hospital - Sliding scale coverage here - At bedtime coverage - Heart healthy/carb modified diet - Monitor CBGs

## 2021-11-30 NOTE — Assessment & Plan Note (Signed)
-   Monitoring BUN/creatinine closely -Trying to avoid all nephrotoxins unfortunately needing IV vancomycin at this point -Continue with IV fluids  Lab Results  Component Value Date   CREATININE 1.51 (H) 11/30/2021   CREATININE 1.77 (H) 11/29/2021   CREATININE 1.87 (H) 11/22/2021   GFR 41, 49,

## 2021-11-30 NOTE — TOC Initial Note (Signed)
Transition of Care [facility]) - Initial/Assessment Note    Patient Details  Name: [name] MRN: [id number] Date of Birth: 23-Apr-1950  [facility] CM/SW Contact:    Iona Beard, [place] Phone Number: 11/30/2021, 2:52 PM  Clinical Narrative:                 Pt is high risk for readmission. CSW spoke with pts relative Vanessa Fairfield listed as family contact as CSW unable to reach pt. Ms. Luciana Axe states that she has been assisting getting pts groceries and giving him transportation to appointments for years. Pt lives alone. Pt is mostly independent in completing ADLs but has found some difficulty in this recently. Pt has had HH in the past though company is unknown. Pt has a wheelchair that was his mothers. Ms. Luciana Axe states he may need a walker or wheelchair at D/C. TOC to follow for MD/PT recommendations.  Expected Discharge Plan: [place] Barriers to Discharge: Continued Medical Work up   Patient Goals and CMS Choice Patient states their goals for this hospitalization and ongoing recovery are:: return home CMS Medicare.gov Compare Post Acute Care list provided to:: Patient Choice offered to / list presented to : Patient  Expected Discharge Plan and Services Expected Discharge Plan: [place] In-house Referral: Clinical Social Work Discharge Planning Services: CM Consult Post Acute Care Choice: Durable Medical Equipment, Home Health Living arrangements for the past 2 months: [place]                                      Prior Living Arrangements/Services Living arrangements for the past 2 months: Single Family Home Lives with:: Self Patient language and need for interpreter reviewed:: Yes Do you feel safe going back to the place where you live?: Yes      Need for Family Participation in Patient Care: Yes (Comment) Care giver support system in place?: Yes (comment) Current home services: DME Criminal Activity/Legal  Involvement Pertinent to Current Situation/Hospitalization: No - Comment as needed  Activities of Daily Living      Permission Sought/Granted                  Emotional Assessment         Alcohol / Substance Use: Not Applicable Psych Involvement: No (comment)  Admission diagnosis:  SIRS (systemic inflammatory response syndrome) ([place]) [R65.10] Osteomyelitis of great toe of left foot ([place]) [M86.9] Sepsis without acute organ dysfunction, due to unspecified organism [facility]) [A41.9] Patient Active Problem List   Diagnosis Date Noted   Hyperlipidemia 11/30/2021   Bedbug bite with infection 11/30/2021    Class: Acute   AKI (acute kidney injury) ([place]) 11/12/2021   Sepsis ([place]) 11/11/2021   Amputated great toe of right foot ([place]) 02/20/2019   Stage 3a chronic kidney disease ([place]) 02/13/2019   Osteomyelitis ([place])    DM (diabetes mellitus) ([place]) 01/30/2019   Fatty liver 01/09/2019   Thrombocytopenia, unspecified ([place]) 05/14/2013   Other specified abnormal findings of blood chemistry 05/14/2013   Colon adenoma 09/13/2012   Anxiety 09/06/2012   Mental retardation 09/06/2012   Chest pain 09/06/2012   Gastroesophageal reflux disease 03/08/2012   Gout 09/06/2011   Hypertension 03/08/2011   Chronic kidney disease 11/30/2009   Urinary tract infection 11/30/2009   PCP:  Pllc, [place]:   [place],  [place] [address] [place] [address] Phone: [phone] Fax: [phone]  [facility] - [place], [place] - [address] [place] [address] Phone: [phone] Fax: [phone]     Social Determinants of Health (SDOH) Interventions    Readmission Risk Interventions    11/30/2021    2:51 PM  Readmission Risk Prevention Plan  Transportation Screening Complete  HRI or [place] Complete  Social Work Consult for [place]  Planning/Counseling Complete  Palliative Care Screening Not Applicable  Medication Review [employer]) Complete

## 2021-11-30 NOTE — Assessment & Plan Note (Addendum)
-  X-ray of the left great toe shows acute comminuted intra-articular fracture of the first distal phalanx.  Cortical irregularity about the tuft of the first distal phalanx with adjacent soft tissue gas suggestive of osteomyelitis - Patient received Vanco cefepime and Flagyl in the ED - Continue vancomycin and Rocephin - ESR and CRP with morning labs - Consult general surgery - Continue to monitor  -Try to obtain MRI of the foot to confirm right foot osteomyelitis (Unable to get MRI at this time as patient allegedly had bedbugs)

## 2021-11-30 NOTE — Assessment & Plan Note (Signed)
-   Continue Lipitor - Heart healthy diet - Continue to monitor

## 2021-12-01 ENCOUNTER — Inpatient Hospital Stay (HOSPITAL_COMMUNITY): Payer: Medicare Other

## 2021-12-01 DIAGNOSIS — R6520 Severe sepsis without septic shock: Secondary | ICD-10-CM | POA: Diagnosis not present

## 2021-12-01 DIAGNOSIS — M869 Osteomyelitis, unspecified: Secondary | ICD-10-CM

## 2021-12-01 DIAGNOSIS — N179 Acute kidney failure, unspecified: Secondary | ICD-10-CM | POA: Diagnosis not present

## 2021-12-01 DIAGNOSIS — M86172 Other acute osteomyelitis, left ankle and foot: Secondary | ICD-10-CM

## 2021-12-01 DIAGNOSIS — E1159 Type 2 diabetes mellitus with other circulatory complications: Secondary | ICD-10-CM

## 2021-12-01 DIAGNOSIS — A419 Sepsis, unspecified organism: Secondary | ICD-10-CM | POA: Diagnosis not present

## 2021-12-01 LAB — COMPREHENSIVE METABOLIC PANEL
ALT: 17 U/L (ref 0–44)
AST: 15 U/L (ref 15–41)
Albumin: 2.2 g/dL — ABNORMAL LOW (ref 3.5–5.0)
Alkaline Phosphatase: 78 U/L (ref 38–126)
Anion gap: 6 (ref 5–15)
BUN: 21 mg/dL (ref 8–23)
CO2: 24 mmol/L (ref 22–32)
Calcium: 7.8 mg/dL — ABNORMAL LOW (ref 8.9–10.3)
Chloride: 100 mmol/L (ref 98–111)
Creatinine, Ser: 1.37 mg/dL — ABNORMAL HIGH (ref 0.61–1.24)
GFR, Estimated: 55 mL/min — ABNORMAL LOW (ref >60)
Glucose, Bld: 194 mg/dL — ABNORMAL HIGH (ref 70–99)
Potassium: 3.8 mmol/L (ref 3.5–5.1)
Sodium: 130 mmol/L — ABNORMAL LOW (ref 135–145)
Total Bilirubin: 0.8 mg/dL (ref 0.3–1.2)
Total Protein: 6 g/dL — ABNORMAL LOW (ref 6.5–8.1)

## 2021-12-01 LAB — GLUCOSE, CAPILLARY
Glucose-Capillary: 191 mg/dL — ABNORMAL HIGH (ref 70–99)
Glucose-Capillary: 220 mg/dL — ABNORMAL HIGH (ref 70–99)
Glucose-Capillary: 293 mg/dL — ABNORMAL HIGH (ref 70–99)
Glucose-Capillary: 190 mg/dL — ABNORMAL HIGH (ref 70–99)

## 2021-12-01 LAB — CBC
HCT: 25.1 % — ABNORMAL LOW (ref 39.0–52.0)
Hemoglobin: 8.2 g/dL — ABNORMAL LOW (ref 13.0–17.0)
MCH: 28.1 pg (ref 26.0–34.0)
MCHC: 32.7 g/dL (ref 30.0–36.0)
MCV: 86 fL (ref 80.0–100.0)
Platelets: 356 10*3/uL (ref 150–400)
RBC: 2.92 MIL/uL — ABNORMAL LOW (ref 4.22–5.81)
RDW: 12.8 % (ref 11.5–15.5)
WBC: 18.1 10*3/uL — ABNORMAL HIGH (ref 4.0–10.5)
nRBC: 0 % (ref 0.0–0.2)

## 2021-12-01 MED ORDER — INSULIN ASPART 100 UNIT/ML IJ SOLN
5.0000 [IU] | Freq: Three times a day (TID) | INTRAMUSCULAR | Status: DC
  Administered 2021-12-02 – 2021-12-03 (×3): 5 [IU] via SUBCUTANEOUS

## 2021-12-01 MED ORDER — GUAIFENESIN-DM 100-10 MG/5ML PO SYRP
15.0000 mL | ORAL_SOLUTION | ORAL | Status: DC | PRN
  Administered 2021-12-01: 15 mL via ORAL
  Filled 2021-12-01: qty 15

## 2021-12-01 MED ORDER — INSULIN DETEMIR 100 UNIT/ML ~~LOC~~ SOLN
15.0000 [IU] | Freq: Every day | SUBCUTANEOUS | Status: DC
  Administered 2021-12-01 – 2021-12-02 (×2): 15 [IU] via SUBCUTANEOUS
  Filled 2021-12-01 (×3): qty 0.15

## 2021-12-01 MED ORDER — SODIUM CHLORIDE 0.9 % IV SOLN
2.0000 g | INTRAVENOUS | Status: DC
  Administered 2021-12-01 – 2021-12-04 (×4): 2 g via INTRAVENOUS
  Filled 2021-12-01 (×4): qty 20

## 2021-12-01 MED ORDER — SODIUM CHLORIDE 0.9 % IV BOLUS
500.0000 mL | Freq: Once | INTRAVENOUS | Status: AC
  Administered 2021-12-01: 500 mL via INTRAVENOUS

## 2021-12-01 MED ORDER — CHLORHEXIDINE GLUCONATE CLOTH 2 % EX PADS
6.0000 | MEDICATED_PAD | Freq: Once | CUTANEOUS | Status: AC
  Administered 2021-12-01: 6 via TOPICAL

## 2021-12-01 MED ORDER — CHLORHEXIDINE GLUCONATE CLOTH 2 % EX PADS
6.0000 | MEDICATED_PAD | Freq: Once | CUTANEOUS | Status: AC
  Administered 2021-12-02: 6 via TOPICAL

## 2021-12-01 NOTE — Progress Notes (Signed)
Earlier in shift around 0000, pt started coughing stating he felt like he had something in his throat. He coughed for about 20 minutes, vomiting up large amount of phelgm x 2. Pt HR also running between 140-150x at this time. Dr. Orlin Hilding notified. New order for EKG and 545m bolus over 1 hour. After vomiting pt stated he felt better.

## 2021-12-01 NOTE — H&P (View-Only) (Signed)
Reason for Consult: Osteomyelitis Referring Physician: Dr. Carlynn Purl  [name] is an 71 y.o. male with a past medical history of T2DM, GERD, HTN, and right toe osteomyelitis with amputation.  HPI: He reports 1-2 months of left big toe swelling with occasional drainage. He reports no specific trauma to the toe but reports his dog could have dragged him by his feet. He endorses occasional pain of the toe but none today. He had been unable to walk long for 8-9 months due to feeling lightheaded with multiple falls. Prior to admission, patient reports 3 days of diaphoresis, vomiting, nausea, and subjective fevers. He has no symptoms today. He endorses a cough for 1 month.   On 9/28, patient presented to the ED and was found to have a fracture of the left toe and was treated for cellulitis with IV antibiotics and outpatient oral doxycycline. On 10/16, patient presented to the ED for nausea, vomiting, and subjective fevers. He had leukocytosis of 18.3. XR of left foot showed osteomyelitis and intra-articular fracture of left big toe. MR of left foot showed osteomyelitis and septic arthritis of left big toe. He is currently on IV Vancomycin, Cefepime, and Flagyl.  Past Medical History:  Diagnosis Date   Anxiety    Arthritis    Colon polyps    Depression    Diabetes mellitus without complication (HCC)    Fatty liver    GERD (gastroesophageal reflux disease)    Gout    High cholesterol    History of echocardiogram 09/14/2011   EF >55%; borderline concentric LVH;    History of stress test 06/21/2010   exercise; normal study   Hypertension    MET TEST 08/15/2011   moderate peak VO2 limitation at 66% predicted; mod cardiac impairment with low SV & low anaerobic threshold, mild chronotropic incompetence; low risk   Nausea    Osteomyelitis (HCC)    right great toe   Renal disorder    Renal insufficiency    Thrombocytopenia ([place])    PMH    Past Surgical History:  Procedure Laterality  Date   AMPUTATION Right 02/13/2019   Procedure: RIGHT GREAT TOE AMPUTATION;  Surgeon: [name], MD;  Location: [facility];  Service: Orthopedics;  Laterality: Right;   CHOLECYSTECTOMY     COLONOSCOPY N/A 09/21/2012   Procedure: COLONOSCOPY;  Surgeon: Rogene Houston, MD;  [facility];  Service: Endoscopy;  Laterality: N/A;  730   COLONOSCOPY N/A 01/30/2019   rehman, eight 4-7 mm polyps in rectum in sigmoid and at splenic flexure, transverse colon and in cecum, one 75m polyp in recto-sigmoid colon, clip was placed, nine polyps total removed   ESOPHAGOGASTRODUODENOSCOPY  11/2005   normal mucosa and GE junction   HERNIA REPAIR     x 2   HIP SURGERY     Rt hip age 71  NASAL POLYP SURGERY      2-3 months ago   POLYPECTOMY  01/30/2019   Procedure: POLYPECTOMY;  Surgeon: RRogene Houston MD;  [facility];  Service: Endoscopy;;  proximal transverse colon, cecal    Family History  Problem Relation Age of Onset   Colon cancer Brother     Social History:  reports that he has never smoked. He has never used smokeless tobacco. He reports that he does not drink alcohol and does not use drugs.  Allergies:  Allergies  Allergen Reactions   Penicillins Other (See Comments)    Unknown Did it  involve swelling of the face/tongue/throat, SOB, or low BP? Unknown Did it involve sudden or severe rash/hives, skin peeling, or any reaction on the inside of your mouth or nose? Unknown Did you need to seek medical attention at a hospital or doctor's office? Unknown When did it last happen?      childhood allergy If all above answers are "NO", may proceed with cephalosporin use.    Ivp Dye [Iodinated Contrast Media]     Sped heart reate up    Tetanus Toxoids Rash    Medications: I have reviewed the patient's current medications.  Results for orders placed or performed during the hospital encounter of 11/29/21 (from the past 48 hour(s))  Comprehensive metabolic panel      Status: Abnormal   Collection Time: 11/29/21  7:10 PM  Result Value Ref Range   Sodium 130 (L) 135 - 145 mmol/L   Potassium 3.9 3.5 - 5.1 mmol/L   Chloride 98 98 - 111 mmol/L   CO2 21 (L) 22 - 32 mmol/L   Glucose, Bld 252 (H) 70 - 99 mg/dL    Comment: Glucose reference range applies only to samples taken after fasting for at least 8 hours.   BUN 31 (H) 8 - 23 mg/dL   Creatinine, Ser 1.77 (H) 0.61 - 1.24 mg/dL   Calcium 8.4 (L) 8.9 - 10.3 mg/dL   Total Protein 6.9 6.5 - 8.1 g/dL   Albumin 2.6 (L) 3.5 - 5.0 g/dL   AST 18 15 - 41 U/L   ALT 20 0 - 44 U/L   Alkaline Phosphatase 78 38 - 126 U/L   Total Bilirubin 1.3 (H) 0.3 - 1.2 mg/dL   GFR, Estimated 41 (L) >60 mL/min    Comment: (NOTE) Calculated using the CKD-EPI Creatinine Equation (2021)    Anion gap 11 5 - 15    Comment: Performed at [facility], [address] [place], [place] [address]  Lactic acid, plasma     Status: None   Collection Time: 11/29/21  7:10 PM  Result Value Ref Range   Lactic Acid, Venous 1.8 0.5 - 1.9 mmol/L    Comment: Performed at [facility], [address], [place], [place] [address]  CBC with Differential     Status: Abnormal   Collection Time: 11/29/21  7:10 PM  Result Value Ref Range   WBC 18.3 (H) 4.0 - 10.5 K/uL   RBC 3.13 (L) 4.22 - 5.81 MIL/uL   Hemoglobin 9.0 (L) 13.0 - 17.0 g/dL   HCT 26.5 (L) 39.0 - 52.0 %   MCV 84.7 80.0 - 100.0 fL   MCH 28.8 26.0 - 34.0 pg   MCHC 34.0 30.0 - 36.0 g/dL   RDW 12.6 11.5 - 15.5 %   Platelets 346 150 - 400 K/uL   nRBC 0.0 0.0 - 0.2 %   Neutrophils Relative % 94 %   Neutro Abs 17.1 (H) 1.7 - 7.7 K/uL   Lymphocytes Relative 1 %   Lymphs Abs 0.2 (L) 0.7 - 4.0 K/uL   Monocytes Relative 4 %   Monocytes Absolute 0.7 0.1 - 1.0 K/uL   Eosinophils Relative 0 %   Eosinophils Absolute 0.1 0.0 - 0.5 K/uL   Basophils Relative 0 %   Basophils Absolute 0.1 0.0 - 0.1 K/uL   Immature Granulocytes 1 %   Abs Immature Granulocytes 0.26 (H) 0.00 - 0.07 K/uL    Comment:  Performed at [facility], [address], [place], [place] [address]  Protime-INR  Status: Abnormal   Collection Time: 11/29/21  7:10 PM  Result Value Ref Range   Prothrombin Time 16.5 (H) 11.4 - 15.2 seconds   INR 1.3 (H) 0.8 - 1.2    Comment: (NOTE) INR goal varies based on device and disease states. Performed at [facility], [address], [place], [place] [address]   APTT     Status: None   Collection Time: 11/29/21  7:10 PM  Result Value Ref Range   aPTT 36 24 - 36 seconds    Comment: Performed at [facility], [address], [place], [place] [address]  Resp Panel by RT-PCR (Flu A&B, Covid) Anterior Nasal Swab     Status: None   Collection Time: 11/29/21  7:18 PM   Specimen: Anterior Nasal Swab  Result Value Ref Range   SARS Coronavirus 2 by RT PCR NEGATIVE NEGATIVE    Comment: (NOTE) SARS-CoV-2 target nucleic acids are NOT DETECTED.  The SARS-CoV-2 RNA is generally detectable in upper respiratory specimens during the acute phase of infection. The lowest concentration of SARS-CoV-2 viral copies this assay can detect is 138 copies/mL. A negative result does not preclude SARS-Cov-2 infection and should not be used as the sole basis for treatment or other patient management decisions. A negative result may occur with  improper specimen collection/handling, submission of specimen other than nasopharyngeal swab, presence of viral mutation(s) within the areas targeted by this assay, and inadequate number of viral copies(<138 copies/mL). A negative result must be combined with clinical observations, patient history, and epidemiological information. The expected result is Negative.  Fact Sheet for Patients:  EntrepreneurPulse.com.au  Fact Sheet for Healthcare Providers:  IncredibleEmployment.be  This test is no t yet approved or cleared by the [place] FDA and  has been authorized for detection and/or diagnosis of SARS-CoV-2 by FDA  under an Emergency Use Authorization (EUA). This EUA will remain  in effect (meaning this test can be used) for the duration of the COVID-19 declaration under Section 564(b)(1) of the Act, 21 U.S.C.section 360bbb-3(b)(1), unless the authorization is terminated  or revoked sooner.       Influenza A by PCR NEGATIVE NEGATIVE   Influenza B by PCR NEGATIVE NEGATIVE    Comment: (NOTE) The Xpert Xpress SARS-CoV-2/FLU/RSV plus assay is intended as an aid in the diagnosis of influenza from Nasopharyngeal swab specimens and should not be used as a sole basis for treatment. Nasal washings and aspirates are unacceptable for Xpert Xpress SARS-CoV-2/FLU/RSV testing.  Fact Sheet for Patients: EntrepreneurPulse.com.au  Fact Sheet for Healthcare Providers: IncredibleEmployment.be  This test is not yet approved or cleared by the [place] FDA and has been authorized for detection and/or diagnosis of SARS-CoV-2 by FDA under an Emergency Use Authorization (EUA). This EUA will remain in effect (meaning this test can be used) for the duration of the COVID-19 declaration under Section 564(b)(1) of the Act, 21 U.S.C. section 360bbb-3(b)(1), unless the authorization is terminated or revoked.  Performed at [facility], [address], [place], [place] [address]   Culture, blood (Routine x 2)     Status: None (Preliminary result)   Collection Time: 11/29/21  7:25 PM   Specimen: BLOOD RIGHT FOREARM  Result Value Ref Range   Specimen Description BLOOD RIGHT FOREARM    Special Requests      BOTTLES DRAWN AEROBIC AND ANAEROBIC Blood Culture results may not be optimal due to an excessive volume of blood received in culture bottles   Culture  NO GROWTH 2 DAYS Performed at [facility], [address], [place], [place] [address]    Report Status PENDING   Culture, blood (Routine x 2)     Status: None (Preliminary result)   Collection Time: 11/29/21  7:30 PM    Specimen: BLOOD RIGHT HAND  Result Value Ref Range   Specimen Description      BLOOD RIGHT HAND Performed at [facility], [address], [place], [place] [address]    Special Requests      BOTTLES DRAWN AEROBIC AND ANAEROBIC Blood Culture results may not be optimal due to an excessive volume of blood received in culture bottles Performed at [place] [place], [place] [address]    Culture  Setup Time      AEROBIC BOTTLE ONLY GRAM POSITIVE COCCI Gram Stain Report Called to,Read Back By and Verified With: LEIGHANN PARSONS @ 1406 ON 11/30/21 C VARNER CRITICAL RESULT CALLED TO, READ BACK BY AND VERIFIED WITH: PHARMD STEVEN [place] ON 11/30/21 @ 1658 BY DRT    Culture      GRAM POSITIVE COCCI TOO YOUNG TO READ Performed at [facility], [place] [address], [place], [place] [address]    Report Status PENDING   Blood Culture ID Panel (Reflexed)     Status: Abnormal   Collection Time: 11/29/21  7:30 PM  Result Value Ref Range   Enterococcus faecalis NOT DETECTED NOT DETECTED   Enterococcus Faecium NOT DETECTED NOT DETECTED   Listeria monocytogenes NOT DETECTED NOT DETECTED   Staphylococcus species NOT DETECTED NOT DETECTED   Staphylococcus aureus (BCID) NOT DETECTED NOT DETECTED   Staphylococcus epidermidis NOT DETECTED NOT DETECTED   Staphylococcus lugdunensis NOT DETECTED NOT DETECTED   Streptococcus species DETECTED (A) NOT DETECTED    Comment: CRITICAL RESULT CALLED TO, READ BACK BY AND VERIFIED WITH: PHARMD STEVEN HURTH ON 11/30/21 @ 1658 BY DRT    Streptococcus agalactiae DETECTED (A) NOT DETECTED    Comment: CRITICAL RESULT CALLED TO, READ BACK BY AND VERIFIED WITH: PHARMD STEVEN HURTH ON 11/30/21 @ 1658 BY DRT    Streptococcus pneumoniae NOT DETECTED NOT DETECTED   Streptococcus pyogenes NOT DETECTED NOT DETECTED   A.calcoaceticus-baumannii NOT DETECTED NOT DETECTED   Bacteroides fragilis NOT DETECTED NOT DETECTED   Enterobacterales NOT DETECTED NOT  DETECTED   Enterobacter cloacae complex NOT DETECTED NOT DETECTED   Escherichia coli NOT DETECTED NOT DETECTED   Klebsiella aerogenes NOT DETECTED NOT DETECTED   Klebsiella oxytoca NOT DETECTED NOT DETECTED   Klebsiella pneumoniae NOT DETECTED NOT DETECTED   Proteus species NOT DETECTED NOT DETECTED   Salmonella species NOT DETECTED NOT DETECTED   Serratia marcescens NOT DETECTED NOT DETECTED   Haemophilus influenzae NOT DETECTED NOT DETECTED   Neisseria meningitidis NOT DETECTED NOT DETECTED   Pseudomonas aeruginosa NOT DETECTED NOT DETECTED   Stenotrophomonas maltophilia NOT DETECTED NOT DETECTED   Candida albicans NOT DETECTED NOT DETECTED   Candida auris NOT DETECTED NOT DETECTED   Candida glabrata NOT DETECTED NOT DETECTED   Candida krusei NOT DETECTED NOT DETECTED   Candida parapsilosis NOT DETECTED NOT DETECTED   Candida tropicalis NOT DETECTED NOT DETECTED   Cryptococcus neoformans/gattii NOT DETECTED NOT DETECTED    Comment: Performed at [facility] [address] [place], [place] [address]  Lactic acid, plasma     Status: None   Collection Time: 11/29/21  9:30 PM  Result Value Ref Range   Lactic Acid, Venous 1.2 0.5 -  1.9 mmol/L    Comment: Performed at [facility], [address], [place], [place] [address]  Magnesium     Status: None   Collection Time: 11/30/21  4:38 AM  Result Value Ref Range   Magnesium 1.9 1.7 - 2.4 mg/dL    Comment: Performed at [facility], [address], [place], [place] [address]  Comprehensive metabolic panel     Status: Abnormal   Collection Time: 11/30/21  4:38 AM  Result Value Ref Range   Sodium 133 (L) 135 - 145 mmol/L   Potassium 3.8 3.5 - 5.1 mmol/L   Chloride 100 98 - 111 mmol/L   CO2 22 22 - 32 mmol/L   Glucose, Bld 232 (H) 70 - 99 mg/dL    Comment: Glucose reference range applies only to samples taken after fasting for at least 8 hours.   BUN 28 (H) 8 - 23 mg/dL   Creatinine, Ser 1.51 (H) 0.61 - 1.24 mg/dL   Calcium 8.5  (L) 8.9 - 10.3 mg/dL   Total Protein 6.5 6.5 - 8.1 g/dL   Albumin 2.5 (L) 3.5 - 5.0 g/dL   AST 13 (L) 15 - 41 U/L   ALT 18 0 - 44 U/L   Alkaline Phosphatase 74 38 - 126 U/L   Total Bilirubin 1.0 0.3 - 1.2 mg/dL   GFR, Estimated 49 (L) >60 mL/min    Comment: (NOTE) Calculated using the CKD-EPI Creatinine Equation (2021)    Anion gap 11 5 - 15    Comment: Performed at [facility], [address], [place], [place] [address]  CBC     Status: Abnormal   Collection Time: 11/30/21  4:38 AM  Result Value Ref Range   WBC 21.5 (H) 4.0 - 10.5 K/uL   RBC 3.09 (L) 4.22 - 5.81 MIL/uL   Hemoglobin 8.7 (L) 13.0 - 17.0 g/dL   HCT 26.6 (L) 39.0 - 52.0 %   MCV 86.1 80.0 - 100.0 fL   MCH 28.2 26.0 - 34.0 pg   MCHC 32.7 30.0 - 36.0 g/dL   RDW 12.5 11.5 - 15.5 %   Platelets 345 150 - 400 K/uL   nRBC 0.0 0.0 - 0.2 %    Comment: Performed at [facility], [address], [place], [place] [address]  Sedimentation rate     Status: Abnormal   Collection Time: 11/30/21  4:38 AM  Result Value Ref Range   Sed Rate 107 (H) 0 - 16 mm/hr    Comment: Performed at [facility], [address] [place], [place] [address]  C-reactive protein     Status: Abnormal   Collection Time: 11/30/21  4:38 AM  Result Value Ref Range   CRP 22.9 (H) <1.0 mg/dL    Comment: Performed at [facility] [address] [place], [place] [address]  Glucose, capillary     Status: Abnormal   Collection Time: 11/30/21  7:50 AM  Result Value Ref Range   Glucose-Capillary 185 (H) 70 - 99 mg/dL    Comment: Glucose reference range applies only to samples taken after fasting for at least 8 hours.  Lactic acid, plasma     Status: None   Collection Time: 11/30/21  8:21 AM  Result Value Ref Range   Lactic Acid, Venous 1.1 0.5 - 1.9 mmol/L    Comment: Performed at [facility], [address] [place], [place] [address]  Glucose, capillary     Status: Abnormal   Collection Time: 11/30/21  9:06 AM  Result Value Ref Range    Glucose-Capillary 200 (H) 70 - 99 mg/dL    Comment: Glucose reference range applies only to samples taken after fasting for at least 8 hours.  Glucose, capillary     Status: Abnormal   Collection Time: 11/30/21 11:35 AM  Result Value Ref Range   Glucose-Capillary 275 (H) 70 - 99 mg/dL    Comment: Glucose reference range applies only to samples taken after fasting for at least 8 hours.  Glucose, capillary     Status: Abnormal   Collection Time: 11/30/21  5:02 PM  Result Value Ref Range   Glucose-Capillary 226 (H) 70 - 99 mg/dL    Comment: Glucose reference range applies only to samples taken after fasting for at least 8 hours.  Glucose, capillary     Status: Abnormal   Collection Time: 11/30/21  9:14 PM  Result Value Ref Range   Glucose-Capillary 287 (H) 70 - 99 mg/dL    Comment: Glucose reference range applies only to samples taken after fasting for at least 8 hours.  CBC     Status: Abnormal   Collection Time: 12/01/21  4:40 AM  Result Value Ref Range   WBC 18.1 (H) 4.0 - 10.5 K/uL   RBC 2.92 (L) 4.22 - 5.81 MIL/uL   Hemoglobin 8.2 (L) 13.0 - 17.0 g/dL   HCT 25.1 (L) 39.0 - 52.0 %   MCV 86.0 80.0 - 100.0 fL   MCH 28.1 26.0 - 34.0 pg   MCHC 32.7 30.0 - 36.0 g/dL   RDW 12.8 11.5 - 15.5 %   Platelets 356 150 - 400 K/uL   nRBC 0.0 0.0 - 0.2 %    Comment: Performed at [facility], [address] [place], [place] [address]  Comprehensive metabolic panel     Status: Abnormal   Collection Time: 12/01/21  4:40 AM  Result Value Ref Range   Sodium 130 (L) 135 - 145 mmol/L   Potassium 3.8 3.5 - 5.1 mmol/L   Chloride 100 98 - 111 mmol/L   CO2 24 22 - 32 mmol/L   Glucose, Bld 194 (H) 70 - 99 mg/dL    Comment: Glucose reference range applies only to samples taken after fasting for at least 8 hours.   BUN 21 8 - 23 mg/dL   Creatinine, Ser 1.37 (H) 0.61 - 1.24 mg/dL   Calcium 7.8 (L) 8.9 - 10.3 mg/dL   Total Protein 6.0 (L) 6.5 - 8.1 g/dL   Albumin 2.2 (L) 3.5 - 5.0 g/dL   AST 15 15 -  41 U/L   ALT 17 0 - 44 U/L   Alkaline Phosphatase 78 38 - 126 U/L   Total Bilirubin 0.8 0.3 - 1.2 mg/dL   GFR, Estimated 55 (L) >60 mL/min    Comment: (NOTE) Calculated using the CKD-EPI Creatinine Equation (2021)    Anion gap 6 5 - 15    Comment: Performed at [facility], [address], [place], [place] [address]  Glucose, capillary     Status: Abnormal   Collection Time: 12/01/21  7:22 AM  Result Value Ref Range   Glucose-Capillary 191 (H) 70 - 99 mg/dL    Comment: Glucose reference range applies only to samples taken after fasting for at least 8 hours.    MR FOOT LEFT W WO CONTRAST  Result Date: 11/30/2021 CLINICAL DATA:  Great toe osteomyelitis EXAM: MRI OF THE LEFT FOREFOOT WITHOUT AND WITH CONTRAST TECHNIQUE: Multiplanar, multisequence MR imaging of the left forefoot  was performed both before and after administration of intravenous contrast. CONTRAST:  78m GADAVIST GADOBUTROL 1 MMOL/ML IV SOLN COMPARISON:  X-ray 11/29/2021, 11/11/2021 FINDINGS: Bones/Joint/Cartilage Destruction/fragmentation of the great toe distal phalanx. Bone marrow edema and patchy low T1 marrow signal changes throughout the proximal phalanx of the great toe. Findings are compatible with acute osteomyelitis. Large complex effusion of the great toe IP joint compatible with septic arthritis. Trace first MTP joint fluid may be reactive. No additional sites of osteomyelitis are identified. There is arthropathy within the midfoot with reactive subchondral marrow signal changes most notably in the lateral cuneiform and navicular. No malalignment. Ligaments Intact Lisfranc ligament. No evidence of acute collateral ligament injury. Muscles and Tendons Denervation changes of the foot musculature. Extensive tenosynovitis of the extensor hallucis longus tendon from the great toe and tracking proximally to the level of the ankle joint. Trace tenosynovitis of the flexor hallucis longus tendon. Both the flexor and extensor hallucis  longus tendons are ill-defined distally and are likely torn. Soft tissues Complex fluid collection surrounding the great toe distal phalanx contiguous with the IP joint measuring approximately 3.9 x 1.8 x 2.8 cm. Ulceration or sinus tract extending to the plantar aspect of the great toe distally. There are scattered foci of susceptibility within the collection and adjacent soft tissues compatible with soft tissue gas. There is also gas seen within the extensor hallucis longus tendon sheath. Diffuse soft tissue edema. IMPRESSION: 1. Acute osteomyelitis of the proximal and distal phalanx phalanx of the great toe. Complex effusion of the great toe IP joint compatible with septic arthritis. 2. Complex fluid collection surrounding the great toe distal phalanx contiguous with the IP joint measuring approximately 3.9 x 1.8 x 2.8 cm. 3. Extensive tenosynovitis of the extensor hallucis longus tendon from the great toe and tracking proximally to the level of the ankle joint. Trace tenosynovitis of the flexor hallucis longus tendon. Both the flexor and extensor hallucis longus tendons are ill-defined distally and are likely torn. Electronically Signed   By: NDavina PokeD.O.   On: 11/30/2021 12:29   DG Toe Great Left  Result Date: 11/29/2021 CLINICAL DATA:  Fever; osteomyelitis EXAM: LEFT GREAT TOE COMPARISON:  Radiographs 11/11/2021 FINDINGS: Acute comminuted displaced fracture of the base of the left first distal phalanx with extension into the IP joint. Question additional fracture of the medial head of the first proximal phalanx. Cortical irregularity about the medial aspect of the tuft of the first distal phalanx. The previously seen nondisplaced fracture of the tuft of the first distal phalanx is not well demonstrated though there is ill-defined densities and soft tissue gas about the distal tuft. Soft tissue swelling about the first toe. IMPRESSION: 1. Acute comminuted intra-articular fracture of the left first  distal phalanx. 2. Cortical irregularity about the tuft of the first distal phalanx with adjacent soft tissue gas suggestive of osteomyelitis. Electronically Signed   By: TPlacido SouM.D.   On: 11/29/2021 20:06   DG Chest Port 1 View  Result Date: 11/29/2021 CLINICAL DATA:  Hyperglycemia EXAM: PORTABLE CHEST 1 VIEW COMPARISON:  Chest x-ray 11/22/2021 FINDINGS: The heart size and mediastinal contours are within normal limits. Both lungs are clear. The visualized skeletal structures are unremarkable. IMPRESSION: No active disease. Electronically Signed   By: ARonney AstersM.D.   On: 11/29/2021 20:05    ROS:  A comprehensive review of systems was negative except for: Respiratory: positive for cough  Blood pressure (!) 108/58, pulse (!) 103, temperature (!) 97.3  F (36.3 C), resp. rate 20, height 6' 5"  (1.956 m), weight 95.4 kg, SpO2 99 %. Physical Exam: Gen: Well appearing, in no distress Neuro: Alert and oriented x3 Cardio: Normal rate and rhythm. No murmurs, rubs, or gallops. Pulm: Normal work of breathing. No wheezing, crackles, or rhales. MSK: Right big toe amputated. Callous noted on sole of right foot. Pedal pulses were +2 of right foot.  Left big toe swollen, erythematous, and warm to touch. Mild tenderness to palpation of toe. Multiple calloses were noted on sole of left foot. Left posterior tibialis and dorsalis pedis pulses were nonpalpable. Femoral pulses were palpable bilaterally.  Assessment/Plan: -Discussed with patient treatment options for left great toe osteomyelitis  -Patient opts for amputation of left great toe given pain -Discussed with patient that the wound after amputation will not be closed to allow healing  Leonette Nutting 12/01/2021, 9:30 AM

## 2021-12-01 NOTE — Consult Note (Signed)
Reason for Consult: Osteomyelitis Referring Physician: Dr. Carlynn Purl  [name] is an 71 y.o. male with a past medical history of T2DM, GERD, HTN, and right toe osteomyelitis with amputation.  HPI: He reports 1-2 months of left big toe swelling with occasional drainage. He reports no specific trauma to the toe but reports his dog could have dragged him by his feet. He endorses occasional pain of the toe but none today. He had been unable to walk long for 8-9 months due to feeling lightheaded with multiple falls. Prior to admission, patient reports 3 days of diaphoresis, vomiting, nausea, and subjective fevers. He has no symptoms today. He endorses a cough for 1 month.   On 9/28, patient presented to the ED and was found to have a fracture of the left toe and was treated for cellulitis with IV antibiotics and outpatient oral doxycycline. On 10/16, patient presented to the ED for nausea, vomiting, and subjective fevers. He had leukocytosis of 18.3. XR of left foot showed osteomyelitis and intra-articular fracture of left big toe. MR of left foot showed osteomyelitis and septic arthritis of left big toe. He is currently on IV Vancomycin, Cefepime, and Flagyl.  Past Medical History:  Diagnosis Date   Anxiety    Arthritis    Colon polyps    Depression    Diabetes mellitus without complication (HCC)    Fatty liver    GERD (gastroesophageal reflux disease)    Gout    High cholesterol    History of echocardiogram 09/14/2011   EF >55%; borderline concentric LVH;    History of stress test 06/21/2010   exercise; normal study   Hypertension    MET TEST 08/15/2011   moderate peak VO2 limitation at 66% predicted; mod cardiac impairment with low SV & low anaerobic threshold, mild chronotropic incompetence; low risk   Nausea    Osteomyelitis (HCC)    right great toe   Renal disorder    Renal insufficiency    Thrombocytopenia ([place])    PMH    Past Surgical History:  Procedure Laterality  Date   AMPUTATION Right 02/13/2019   Procedure: RIGHT GREAT TOE AMPUTATION;  Surgeon: [name], MD;  Location: [facility];  Service: Orthopedics;  Laterality: Right;   CHOLECYSTECTOMY     COLONOSCOPY N/A 09/21/2012   Procedure: COLONOSCOPY;  Surgeon: Rogene Houston, MD;  [facility];  Service: Endoscopy;  Laterality: N/A;  730   COLONOSCOPY N/A 01/30/2019   rehman, eight 4-7 mm polyps in rectum in sigmoid and at splenic flexure, transverse colon and in cecum, one 36m polyp in recto-sigmoid colon, clip was placed, nine polyps total removed   ESOPHAGOGASTRODUODENOSCOPY  11/2005   normal mucosa and GE junction   HERNIA REPAIR     x 2   HIP SURGERY     Rt hip age 89  NASAL POLYP SURGERY      2-3 months ago   POLYPECTOMY  01/30/2019   Procedure: POLYPECTOMY;  Surgeon: RRogene Houston MD;  [facility];  Service: Endoscopy;;  proximal transverse colon, cecal    Family History  Problem Relation Age of Onset   Colon cancer Brother     Social History:  reports that he has never smoked. He has never used smokeless tobacco. He reports that he does not drink alcohol and does not use drugs.  Allergies:  Allergies  Allergen Reactions   Penicillins Other (See Comments)    Unknown Did it  involve swelling of the face/tongue/throat, SOB, or low BP? Unknown Did it involve sudden or severe rash/hives, skin peeling, or any reaction on the inside of your mouth or nose? Unknown Did you need to seek medical attention at a hospital or doctor's office? Unknown When did it last happen?      childhood allergy If all above answers are "NO", may proceed with cephalosporin use.    Ivp Dye [Iodinated Contrast Media]     Sped heart reate up    Tetanus Toxoids Rash    Medications: I have reviewed the patient's current medications.  Results for orders placed or performed during the hospital encounter of 11/29/21 (from the past 48 hour(s))  Comprehensive metabolic panel      Status: Abnormal   Collection Time: 11/29/21  7:10 PM  Result Value Ref Range   Sodium 130 (L) 135 - 145 mmol/L   Potassium 3.9 3.5 - 5.1 mmol/L   Chloride 98 98 - 111 mmol/L   CO2 21 (L) 22 - 32 mmol/L   Glucose, Bld 252 (H) 70 - 99 mg/dL    Comment: Glucose reference range applies only to samples taken after fasting for at least 8 hours.   BUN 31 (H) 8 - 23 mg/dL   Creatinine, Ser 1.77 (H) 0.61 - 1.24 mg/dL   Calcium 8.4 (L) 8.9 - 10.3 mg/dL   Total Protein 6.9 6.5 - 8.1 g/dL   Albumin 2.6 (L) 3.5 - 5.0 g/dL   AST 18 15 - 41 U/L   ALT 20 0 - 44 U/L   Alkaline Phosphatase 78 38 - 126 U/L   Total Bilirubin 1.3 (H) 0.3 - 1.2 mg/dL   GFR, Estimated 41 (L) >60 mL/min    Comment: (NOTE) Calculated using the CKD-EPI Creatinine Equation (2021)    Anion gap 11 5 - 15    Comment: Performed at [facility], [address] [place], [place] [address]  Lactic acid, plasma     Status: None   Collection Time: 11/29/21  7:10 PM  Result Value Ref Range   Lactic Acid, Venous 1.8 0.5 - 1.9 mmol/L    Comment: Performed at [facility], [address], [place], [place] [address]  CBC with Differential     Status: Abnormal   Collection Time: 11/29/21  7:10 PM  Result Value Ref Range   WBC 18.3 (H) 4.0 - 10.5 K/uL   RBC 3.13 (L) 4.22 - 5.81 MIL/uL   Hemoglobin 9.0 (L) 13.0 - 17.0 g/dL   HCT 26.5 (L) 39.0 - 52.0 %   MCV 84.7 80.0 - 100.0 fL   MCH 28.8 26.0 - 34.0 pg   MCHC 34.0 30.0 - 36.0 g/dL   RDW 12.6 11.5 - 15.5 %   Platelets 346 150 - 400 K/uL   nRBC 0.0 0.0 - 0.2 %   Neutrophils Relative % 94 %   Neutro Abs 17.1 (H) 1.7 - 7.7 K/uL   Lymphocytes Relative 1 %   Lymphs Abs 0.2 (L) 0.7 - 4.0 K/uL   Monocytes Relative 4 %   Monocytes Absolute 0.7 0.1 - 1.0 K/uL   Eosinophils Relative 0 %   Eosinophils Absolute 0.1 0.0 - 0.5 K/uL   Basophils Relative 0 %   Basophils Absolute 0.1 0.0 - 0.1 K/uL   Immature Granulocytes 1 %   Abs Immature Granulocytes 0.26 (H) 0.00 - 0.07 K/uL    Comment:  Performed at [facility], [address], [place], [place] [address]  Protime-INR  Status: Abnormal   Collection Time: 11/29/21  7:10 PM  Result Value Ref Range   Prothrombin Time 16.5 (H) 11.4 - 15.2 seconds   INR 1.3 (H) 0.8 - 1.2    Comment: (NOTE) INR goal varies based on device and disease states. Performed at [facility], [address] [place], [place] [address]   APTT     Status: None   Collection Time: 11/29/21  7:10 PM  Result Value Ref Range   aPTT 36 24 - 36 seconds    Comment: Performed at [facility], [address], [place], [place] [address]  Resp Panel by RT-PCR (Flu A&B, Covid) Anterior Nasal Swab     Status: None   Collection Time: 11/29/21  7:18 PM   Specimen: Anterior Nasal Swab  Result Value Ref Range   SARS Coronavirus 2 by RT PCR NEGATIVE NEGATIVE    Comment: (NOTE) SARS-CoV-2 target nucleic acids are NOT DETECTED.  The SARS-CoV-2 RNA is generally detectable in upper respiratory specimens during the acute phase of infection. The lowest concentration of SARS-CoV-2 viral copies this assay can detect is 138 copies/mL. A negative result does not preclude SARS-Cov-2 infection and should not be used as the sole basis for treatment or other patient management decisions. A negative result may occur with  improper specimen collection/handling, submission of specimen other than nasopharyngeal swab, presence of viral mutation(s) within the areas targeted by this assay, and inadequate number of viral copies(<138 copies/mL). A negative result must be combined with clinical observations, patient history, and epidemiological information. The expected result is Negative.  Fact Sheet for Patients:  EntrepreneurPulse.com.au  Fact Sheet for Healthcare Providers:  IncredibleEmployment.be  This test is no t yet approved or cleared by the [place] FDA and  has been authorized for detection and/or diagnosis of SARS-CoV-2 by FDA  under an Emergency Use Authorization (EUA). This EUA will remain  in effect (meaning this test can be used) for the duration of the COVID-19 declaration under Section 564(b)(1) of the Act, 21 U.S.C.section 360bbb-3(b)(1), unless the authorization is terminated  or revoked sooner.       Influenza A by PCR NEGATIVE NEGATIVE   Influenza B by PCR NEGATIVE NEGATIVE    Comment: (NOTE) The Xpert Xpress SARS-CoV-2/FLU/RSV plus assay is intended as an aid in the diagnosis of influenza from Nasopharyngeal swab specimens and should not be used as a sole basis for treatment. Nasal washings and aspirates are unacceptable for Xpert Xpress SARS-CoV-2/FLU/RSV testing.  Fact Sheet for Patients: EntrepreneurPulse.com.au  Fact Sheet for Healthcare Providers: IncredibleEmployment.be  This test is not yet approved or cleared by the [place] FDA and has been authorized for detection and/or diagnosis of SARS-CoV-2 by FDA under an Emergency Use Authorization (EUA). This EUA will remain in effect (meaning this test can be used) for the duration of the COVID-19 declaration under Section 564(b)(1) of the Act, 21 U.S.C. section 360bbb-3(b)(1), unless the authorization is terminated or revoked.  Performed at [facility], [address] [place], [place] [address]   Culture, blood (Routine x 2)     Status: None (Preliminary result)   Collection Time: 11/29/21  7:25 PM   Specimen: BLOOD RIGHT FOREARM  Result Value Ref Range   Specimen Description BLOOD RIGHT FOREARM    Special Requests      BOTTLES DRAWN AEROBIC AND ANAEROBIC Blood Culture results may not be optimal due to an excessive volume of blood received in culture bottles   Culture  NO GROWTH 2 DAYS Performed at [facility], [address] [place], [place] [address]    Report Status PENDING   Culture, blood (Routine x 2)     Status: None (Preliminary result)   Collection Time: 11/29/21  7:30 PM    Specimen: BLOOD RIGHT HAND  Result Value Ref Range   Specimen Description      BLOOD RIGHT HAND Performed at [facility], [address], [place], [place] [address]    Special Requests      BOTTLES DRAWN AEROBIC AND ANAEROBIC Blood Culture results may not be optimal due to an excessive volume of blood received in culture bottles Performed at [place] [place], [place] [address]    Culture  Setup Time      AEROBIC BOTTLE ONLY GRAM POSITIVE COCCI Gram Stain Report Called to,Read Back By and Verified With: LEIGHANN PARSONS @ 1406 ON 11/30/21 C VARNER CRITICAL RESULT CALLED TO, READ BACK BY AND VERIFIED WITH: PHARMD STEVEN [place] ON 11/30/21 @ 1658 BY DRT    Culture      GRAM POSITIVE COCCI TOO YOUNG TO READ Performed at [facility], [place] [address] [place],  [address]    Report Status PENDING   Blood Culture ID Panel (Reflexed)     Status: Abnormal   Collection Time: 11/29/21  7:30 PM  Result Value Ref Range   Enterococcus faecalis NOT DETECTED NOT DETECTED   Enterococcus Faecium NOT DETECTED NOT DETECTED   Listeria monocytogenes NOT DETECTED NOT DETECTED   Staphylococcus species NOT DETECTED NOT DETECTED   Staphylococcus aureus (BCID) NOT DETECTED NOT DETECTED   Staphylococcus epidermidis NOT DETECTED NOT DETECTED   Staphylococcus lugdunensis NOT DETECTED NOT DETECTED   Streptococcus species DETECTED (A) NOT DETECTED    Comment: CRITICAL RESULT CALLED TO, READ BACK BY AND VERIFIED WITH: PHARMD STEVEN [place] ON 11/30/21 @ 1658 BY DRT    Streptococcus agalactiae DETECTED (A) NOT DETECTED    Comment: CRITICAL RESULT CALLED TO, READ BACK BY AND VERIFIED WITH: PHARMD STEVEN [place] ON 11/30/21 @ 1658 BY DRT    Streptococcus pneumoniae NOT DETECTED NOT DETECTED   Streptococcus pyogenes NOT DETECTED NOT DETECTED   A.calcoaceticus-baumannii NOT DETECTED NOT DETECTED   Bacteroides fragilis NOT DETECTED NOT DETECTED   Enterobacterales NOT DETECTED NOT  DETECTED   Enterobacter cloacae complex NOT DETECTED NOT DETECTED   Escherichia coli NOT DETECTED NOT DETECTED   Klebsiella aerogenes NOT DETECTED NOT DETECTED   Klebsiella oxytoca NOT DETECTED NOT DETECTED   Klebsiella pneumoniae NOT DETECTED NOT DETECTED   Proteus species NOT DETECTED NOT DETECTED   Salmonella species NOT DETECTED NOT DETECTED   Serratia marcescens NOT DETECTED NOT DETECTED   Haemophilus influenzae NOT DETECTED NOT DETECTED   Neisseria meningitidis NOT DETECTED NOT DETECTED   Pseudomonas aeruginosa NOT DETECTED NOT DETECTED   Stenotrophomonas maltophilia NOT DETECTED NOT DETECTED   Candida albicans NOT DETECTED NOT DETECTED   Candida auris NOT DETECTED NOT DETECTED   Candida glabrata NOT DETECTED NOT DETECTED   Candida krusei NOT DETECTED NOT DETECTED   Candida parapsilosis NOT DETECTED NOT DETECTED   Candida tropicalis NOT DETECTED NOT DETECTED   Cryptococcus neoformans/gattii NOT DETECTED NOT DETECTED    Comment: Performed at [facility] [address] [place], [place] [address]  Lactic acid, plasma     Status: None   Collection Time: 11/29/21  9:30 PM  Result Value Ref Range   Lactic Acid, Venous 1.2 0.5 -  1.9 mmol/L    Comment: Performed at [facility], [address], [place], [place] [address]  Magnesium     Status: None   Collection Time: 11/30/21  4:38 AM  Result Value Ref Range   Magnesium 1.9 1.7 - 2.4 mg/dL    Comment: Performed at [facility], [address], [place], [place] [address]  Comprehensive metabolic panel     Status: Abnormal   Collection Time: 11/30/21  4:38 AM  Result Value Ref Range   Sodium 133 (L) 135 - 145 mmol/L   Potassium 3.8 3.5 - 5.1 mmol/L   Chloride 100 98 - 111 mmol/L   CO2 22 22 - 32 mmol/L   Glucose, Bld 232 (H) 70 - 99 mg/dL    Comment: Glucose reference range applies only to samples taken after fasting for at least 8 hours.   BUN 28 (H) 8 - 23 mg/dL   Creatinine, Ser 1.51 (H) 0.61 - 1.24 mg/dL   Calcium 8.5  (L) 8.9 - 10.3 mg/dL   Total Protein 6.5 6.5 - 8.1 g/dL   Albumin 2.5 (L) 3.5 - 5.0 g/dL   AST 13 (L) 15 - 41 U/L   ALT 18 0 - 44 U/L   Alkaline Phosphatase 74 38 - 126 U/L   Total Bilirubin 1.0 0.3 - 1.2 mg/dL   GFR, Estimated 49 (L) >60 mL/min    Comment: (NOTE) Calculated using the CKD-EPI Creatinine Equation (2021)    Anion gap 11 5 - 15    Comment: Performed at [facility], [address], [place], [place] [address]  CBC     Status: Abnormal   Collection Time: 11/30/21  4:38 AM  Result Value Ref Range   WBC 21.5 (H) 4.0 - 10.5 K/uL   RBC 3.09 (L) 4.22 - 5.81 MIL/uL   Hemoglobin 8.7 (L) 13.0 - 17.0 g/dL   HCT 26.6 (L) 39.0 - 52.0 %   MCV 86.1 80.0 - 100.0 fL   MCH 28.2 26.0 - 34.0 pg   MCHC 32.7 30.0 - 36.0 g/dL   RDW 12.5 11.5 - 15.5 %   Platelets 345 150 - 400 K/uL   nRBC 0.0 0.0 - 0.2 %    Comment: Performed at [facility], [address] [place], [place] [address]  Sedimentation rate     Status: Abnormal   Collection Time: 11/30/21  4:38 AM  Result Value Ref Range   Sed Rate 107 (H) 0 - 16 mm/hr    Comment: Performed at [facility], [address] [place], [place] [address]  C-reactive protein     Status: Abnormal   Collection Time: 11/30/21  4:38 AM  Result Value Ref Range   CRP 22.9 (H) <1.0 mg/dL    Comment: Performed at [facility] [address] [place], [place] [address]  Glucose, capillary     Status: Abnormal   Collection Time: 11/30/21  7:50 AM  Result Value Ref Range   Glucose-Capillary 185 (H) 70 - 99 mg/dL    Comment: Glucose reference range applies only to samples taken after fasting for at least 8 hours.  Lactic acid, plasma     Status: None   Collection Time: 11/30/21  8:21 AM  Result Value Ref Range   Lactic Acid, Venous 1.1 0.5 - 1.9 mmol/L    Comment: Performed at [facility], [address] [place] [address] [place],  [address]  Glucose, capillary     Status: Abnormal   Collection Time: 11/30/21  9:06 AM  Result Value Ref Range    Glucose-Capillary 200 (H) 70 - 99 mg/dL    Comment: Glucose reference range applies only to samples taken after fasting for at least 8 hours.  Glucose, capillary     Status: Abnormal   Collection Time: 11/30/21 11:35 AM  Result Value Ref Range   Glucose-Capillary 275 (H) 70 - 99 mg/dL    Comment: Glucose reference range applies only to samples taken after fasting for at least 8 hours.  Glucose, capillary     Status: Abnormal   Collection Time: 11/30/21  5:02 PM  Result Value Ref Range   Glucose-Capillary 226 (H) 70 - 99 mg/dL    Comment: Glucose reference range applies only to samples taken after fasting for at least 8 hours.  Glucose, capillary     Status: Abnormal   Collection Time: 11/30/21  9:14 PM  Result Value Ref Range   Glucose-Capillary 287 (H) 70 - 99 mg/dL    Comment: Glucose reference range applies only to samples taken after fasting for at least 8 hours.  CBC     Status: Abnormal   Collection Time: 12/01/21  4:40 AM  Result Value Ref Range   WBC 18.1 (H) 4.0 - 10.5 K/uL   RBC 2.92 (L) 4.22 - 5.81 MIL/uL   Hemoglobin 8.2 (L) 13.0 - 17.0 g/dL   HCT 25.1 (L) 39.0 - 52.0 %   MCV 86.0 80.0 - 100.0 fL   MCH 28.1 26.0 - 34.0 pg   MCHC 32.7 30.0 - 36.0 g/dL   RDW 12.8 11.5 - 15.5 %   Platelets 356 150 - 400 K/uL   nRBC 0.0 0.0 - 0.2 %    Comment: Performed at [facility], [address] [place], [place] [address]  Comprehensive metabolic panel     Status: Abnormal   Collection Time: 12/01/21  4:40 AM  Result Value Ref Range   Sodium 130 (L) 135 - 145 mmol/L   Potassium 3.8 3.5 - 5.1 mmol/L   Chloride 100 98 - 111 mmol/L   CO2 24 22 - 32 mmol/L   Glucose, Bld 194 (H) 70 - 99 mg/dL    Comment: Glucose reference range applies only to samples taken after fasting for at least 8 hours.   BUN 21 8 - 23 mg/dL   Creatinine, Ser 1.37 (H) 0.61 - 1.24 mg/dL   Calcium 7.8 (L) 8.9 - 10.3 mg/dL   Total Protein 6.0 (L) 6.5 - 8.1 g/dL   Albumin 2.2 (L) 3.5 - 5.0 g/dL   AST 15 15 -  41 U/L   ALT 17 0 - 44 U/L   Alkaline Phosphatase 78 38 - 126 U/L   Total Bilirubin 0.8 0.3 - 1.2 mg/dL   GFR, Estimated 55 (L) >60 mL/min    Comment: (NOTE) Calculated using the CKD-EPI Creatinine Equation (2021)    Anion gap 6 5 - 15    Comment: Performed at [facility], [address] [place], [place] [address]  Glucose, capillary     Status: Abnormal   Collection Time: 12/01/21  7:22 AM  Result Value Ref Range   Glucose-Capillary 191 (H) 70 - 99 mg/dL    Comment: Glucose reference range applies only to samples taken after fasting for at least 8 hours.    MR FOOT LEFT W WO CONTRAST  Result Date: 11/30/2021 CLINICAL DATA:  Great toe osteomyelitis EXAM: MRI OF THE LEFT FOREFOOT WITHOUT AND WITH CONTRAST TECHNIQUE: Multiplanar, multisequence MR imaging of the left forefoot  was performed both before and after administration of intravenous contrast. CONTRAST:  49m GADAVIST GADOBUTROL 1 MMOL/ML IV SOLN COMPARISON:  X-ray 11/29/2021, 11/11/2021 FINDINGS: Bones/Joint/Cartilage Destruction/fragmentation of the great toe distal phalanx. Bone marrow edema and patchy low T1 marrow signal changes throughout the proximal phalanx of the great toe. Findings are compatible with acute osteomyelitis. Large complex effusion of the great toe IP joint compatible with septic arthritis. Trace first MTP joint fluid may be reactive. No additional sites of osteomyelitis are identified. There is arthropathy within the midfoot with reactive subchondral marrow signal changes most notably in the lateral cuneiform and navicular. No malalignment. Ligaments Intact Lisfranc ligament. No evidence of acute collateral ligament injury. Muscles and Tendons Denervation changes of the foot musculature. Extensive tenosynovitis of the extensor hallucis longus tendon from the great toe and tracking proximally to the level of the ankle joint. Trace tenosynovitis of the flexor hallucis longus tendon. Both the flexor and extensor hallucis  longus tendons are ill-defined distally and are likely torn. Soft tissues Complex fluid collection surrounding the great toe distal phalanx contiguous with the IP joint measuring approximately 3.9 x 1.8 x 2.8 cm. Ulceration or sinus tract extending to the plantar aspect of the great toe distally. There are scattered foci of susceptibility within the collection and adjacent soft tissues compatible with soft tissue gas. There is also gas seen within the extensor hallucis longus tendon sheath. Diffuse soft tissue edema. IMPRESSION: 1. Acute osteomyelitis of the proximal and distal phalanx phalanx of the great toe. Complex effusion of the great toe IP joint compatible with septic arthritis. 2. Complex fluid collection surrounding the great toe distal phalanx contiguous with the IP joint measuring approximately 3.9 x 1.8 x 2.8 cm. 3. Extensive tenosynovitis of the extensor hallucis longus tendon from the great toe and tracking proximally to the level of the ankle joint. Trace tenosynovitis of the flexor hallucis longus tendon. Both the flexor and extensor hallucis longus tendons are ill-defined distally and are likely torn. Electronically Signed   By: NDavina PokeD.O.   On: 11/30/2021 12:29   DG Toe Great Left  Result Date: 11/29/2021 CLINICAL DATA:  Fever; osteomyelitis EXAM: LEFT GREAT TOE COMPARISON:  Radiographs 11/11/2021 FINDINGS: Acute comminuted displaced fracture of the base of the left first distal phalanx with extension into the IP joint. Question additional fracture of the medial head of the first proximal phalanx. Cortical irregularity about the medial aspect of the tuft of the first distal phalanx. The previously seen nondisplaced fracture of the tuft of the first distal phalanx is not well demonstrated though there is ill-defined densities and soft tissue gas about the distal tuft. Soft tissue swelling about the first toe. IMPRESSION: 1. Acute comminuted intra-articular fracture of the left first  distal phalanx. 2. Cortical irregularity about the tuft of the first distal phalanx with adjacent soft tissue gas suggestive of osteomyelitis. Electronically Signed   By: TPlacido SouM.D.   On: 11/29/2021 20:06   DG Chest Port 1 View  Result Date: 11/29/2021 CLINICAL DATA:  Hyperglycemia EXAM: PORTABLE CHEST 1 VIEW COMPARISON:  Chest x-ray 11/22/2021 FINDINGS: The heart size and mediastinal contours are within normal limits. Both lungs are clear. The visualized skeletal structures are unremarkable. IMPRESSION: No active disease. Electronically Signed   By: ARonney AstersM.D.   On: 11/29/2021 20:05    ROS:  A comprehensive review of systems was negative except for: Respiratory: positive for cough  Blood pressure (!) 108/58, pulse (!) 103, temperature (!) 97.3  F (36.3 C), resp. rate 20, height 6' 5"  (1.956 m), weight 95.4 kg, SpO2 99 %. Physical Exam: Gen: Well appearing, in no distress Neuro: Alert and oriented x3 Cardio: Normal rate and rhythm. No murmurs, rubs, or gallops. Pulm: Normal work of breathing. No wheezing, crackles, or rhales. MSK: Right big toe amputated. Callous noted on sole of right foot. Pedal pulses were +2 of right foot.  Left big toe swollen, erythematous, and warm to touch. Mild tenderness to palpation of toe. Multiple calloses were noted on sole of left foot. Left posterior tibialis and dorsalis pedis pulses were nonpalpable. Femoral pulses were palpable bilaterally.  Assessment/Plan: -Discussed with patient treatment options for left great toe osteomyelitis  -Patient opts for amputation of left great toe given pain -Discussed with patient that the wound after amputation will not be closed to allow healing  Leonette Nutting 12/01/2021, 9:30 AM

## 2021-12-01 NOTE — Progress Notes (Signed)
   Subjective:  Patient states he is feeling okay but continues to endorse a sensation of something stuck in his throat. Patient has not vomited since the episode early this morning and his cough has improved. Patient states his toe "hurts just a little bit".  Objective:  Vital signs in last 24 hours: Vitals:   11/30/21 1441 11/30/21 1703 11/30/21 1958 12/01/21 0529  BP: 133/65 (!) 142/65 (!) 130/57 (!) 108/58  Pulse: (!) 113 (!) 117 (!) 111 (!) 103  Resp: '18  19 20  '$ Temp: 98.9 F (37.2 C) (!) 100.7 F (38.2 C) (!) 96.9 F (36.1 C) (!) 97.3 F (36.3 C)  TempSrc: Oral Oral    SpO2: 97% 96% 95% 99%  Weight:      Height:       Weight change: -0.309 kg  Intake/Output Summary (Last 24 hours) at 12/01/2021 0915 Last data filed at 12/01/2021 0654 Gross per 24 hour  Intake 1288.7 ml  Output 1200 ml  Net 88.7 ml   Physical Exam  General: Lying in bed in no acute distress Head: Normocephalic, atraumatic Cardiovascular: tachycardic, without murmurs, rubs, or gallops, no LEE Respiratory: Normal work of breathing, lungs CTAB Abdominal: normoactive bowel sounds, no tenderness to palpation Extremities: left great toe with non-purulent crusted wound, mild erythema extending from left toe to midfoot, mild tenderness to palpation Skin: warm, dry Psychiatric: Normal mood and affect.  Assessment/Plan:  Principal Problem:   Sepsis ([place]) Active Problems:   Hypertension   Anxiety   DM (diabetes mellitus) ([place])   Osteomyelitis (HCC)   Amputated great toe of right foot ([place])   Stage 3a chronic kidney disease ([place])   AKI (acute kidney injury) ([place])   Hyperlipidemia   Bedbug bite with infection   Assessment and Plan:  Sepsis secondary to Osteomyelitis of Left Great Toe Patient continuing to meet sepsis criteria with tachycardia and WBC of 18.1, but numbers are trending down. Lactate is 1.1. With negative chest X-ray and negative COVID, source is most likely from acute osteomyelitis  of left great toe (MRI).  Patient has been treated with Cefepime (Day 3), Vancomycin (Day 3), and Flagyl (Day 2). Blood cultures are positive for gram + cocci so aforementioned abx. discontinued and IV Rocephin 2 g starting today. ABI ordered. General surgery consulted and will proceed with left great toe amputation tomorrow. Patient will be NPO after midnight. -Continue to monitor vitals and CBC -Continue Rocephin -F/u with ABI findings and surgery consult  Bedbug bite with infection Patient presented with bites consistent with bed bug infection. Patient cleaned and given Permethrin. No longer on contact precautions.   Hyperlipidemia - Continue Lipitor - Heart healthy diet - Continue to monitor   AKI (acute kidney injury) ([place]) Resolved   DM (diabetes mellitus) ([place]) A1C from 9/23 is 6.8. CBG currently at 191.  -Continue Levemir 10 u qhs -Continue SSI   Anxiety - Continue Zoloft and Bupropion   Hypertension - Currently stable  Hypoalbuminemia Albumin is 2.2 which has been trending down since admission. Patient reports good appetite. Glucerna Shake TID initiated yesterday. -CTM    LOS: 3 Days  Stanford Breed, Medical Student 12/01/2021, 9:15 AM

## 2021-12-01 NOTE — Inpatient Diabetes Management (Signed)
Inpatient Diabetes Program Recommendations  AACE/ADA: New Consensus Statement on Inpatient Glycemic Control (2015)  Target Ranges:  Prepandial:   less than 140 mg/dL      Peak postprandial:   less than 180 mg/dL (1-2 hours)      Critically ill patients:  140 - 180 mg/dL   Lab Results  Component Value Date   GLUCAP 191 (H) 12/01/2021   HGBA1C 6.8 (H) 11/12/2021    Review of Glycemic Control  Latest Reference Range & Units 11/30/21 09:06 11/30/21 11:35 11/30/21 17:02 11/30/21 21:14 12/01/21 07:22  Glucose-Capillary 70 - 99 mg/dL 200 (H) 275 (H) 226 (H) 287 (H) 191 (H)   Diabetes history: DM 2 Outpatient Diabetes medications: Actos 30 mg Daily, Toujeo 20 units Daily Current orders for Inpatient glycemic control:  Levemir 10 units qhs Novolog 0-15 units tid + hs  Glucerna tid between meals A1c 6.8% on 9/29  Inpatient Diabetes Program Recommendations:    -  Increase Levemir to 15 units -  Add Novolog 3 units tid meal coverage if eating > 50% of meals  Thanks,  Tama Headings RN, MSN, BC-ADM Inpatient Diabetes Coordinator Team Pager [phone] (8a-5p)

## 2021-12-02 ENCOUNTER — Encounter (HOSPITAL_COMMUNITY): Payer: Self-pay | Admitting: Family Medicine

## 2021-12-02 ENCOUNTER — Encounter (HOSPITAL_COMMUNITY)
Admission: EM | Disposition: A | Discharge status: Home / Self Care | Payer: Self-pay | Source: Home / Self Care | Attending: Internal Medicine

## 2021-12-02 ENCOUNTER — Other Ambulatory Visit: Payer: Self-pay

## 2021-12-02 ENCOUNTER — Inpatient Hospital Stay (HOSPITAL_COMMUNITY): Payer: Medicare Other | Admitting: Anesthesiology

## 2021-12-02 DIAGNOSIS — A419 Sepsis, unspecified organism: Secondary | ICD-10-CM | POA: Diagnosis not present

## 2021-12-02 DIAGNOSIS — E1169 Type 2 diabetes mellitus with other specified complication: Secondary | ICD-10-CM

## 2021-12-02 DIAGNOSIS — Z794 Long term (current) use of insulin: Secondary | ICD-10-CM

## 2021-12-02 DIAGNOSIS — E1122 Type 2 diabetes mellitus with diabetic chronic kidney disease: Secondary | ICD-10-CM

## 2021-12-02 DIAGNOSIS — M869 Osteomyelitis, unspecified: Secondary | ICD-10-CM

## 2021-12-02 DIAGNOSIS — N179 Acute kidney failure, unspecified: Secondary | ICD-10-CM | POA: Diagnosis not present

## 2021-12-02 DIAGNOSIS — N189 Chronic kidney disease, unspecified: Secondary | ICD-10-CM

## 2021-12-02 DIAGNOSIS — R6520 Severe sepsis without septic shock: Secondary | ICD-10-CM | POA: Diagnosis not present

## 2021-12-02 DIAGNOSIS — I129 Hypertensive chronic kidney disease with stage 1 through stage 4 chronic kidney disease, or unspecified chronic kidney disease: Secondary | ICD-10-CM

## 2021-12-02 HISTORY — PX: AMPUTATION TOE: SHX6595

## 2021-12-02 LAB — COMPREHENSIVE METABOLIC PANEL
ALT: 15 U/L (ref 0–44)
AST: 13 U/L — ABNORMAL LOW (ref 15–41)
Albumin: 2 g/dL — ABNORMAL LOW (ref 3.5–5.0)
Alkaline Phosphatase: 73 U/L (ref 38–126)
Anion gap: 6 (ref 5–15)
BUN: 15 mg/dL (ref 8–23)
CO2: 28 mmol/L (ref 22–32)
Calcium: 8 mg/dL — ABNORMAL LOW (ref 8.9–10.3)
Chloride: 97 mmol/L — ABNORMAL LOW (ref 98–111)
Creatinine, Ser: 1.35 mg/dL — ABNORMAL HIGH (ref 0.61–1.24)
GFR, Estimated: 56 mL/min — ABNORMAL LOW (ref >60)
Glucose, Bld: 141 mg/dL — ABNORMAL HIGH (ref 70–99)
Potassium: 3.8 mmol/L (ref 3.5–5.1)
Sodium: 131 mmol/L — ABNORMAL LOW (ref 135–145)
Total Bilirubin: 0.7 mg/dL (ref 0.3–1.2)
Total Protein: 5.6 g/dL — ABNORMAL LOW (ref 6.5–8.1)

## 2021-12-02 LAB — CBC
HCT: 22.9 % — ABNORMAL LOW (ref 39.0–52.0)
Hemoglobin: 7.6 g/dL — ABNORMAL LOW (ref 13.0–17.0)
MCH: 28.3 pg (ref 26.0–34.0)
MCHC: 33.2 g/dL (ref 30.0–36.0)
MCV: 85.1 fL (ref 80.0–100.0)
Platelets: 341 10*3/uL (ref 150–400)
RBC: 2.69 MIL/uL — ABNORMAL LOW (ref 4.22–5.81)
RDW: 12.7 % (ref 11.5–15.5)
WBC: 17.2 10*3/uL — ABNORMAL HIGH (ref 4.0–10.5)
nRBC: 0 % (ref 0.0–0.2)

## 2021-12-02 LAB — GLUCOSE, CAPILLARY
Glucose-Capillary: 130 mg/dL — ABNORMAL HIGH (ref 70–99)
Glucose-Capillary: 114 mg/dL — ABNORMAL HIGH (ref 70–99)
Glucose-Capillary: 123 mg/dL — ABNORMAL HIGH (ref 70–99)
Glucose-Capillary: 234 mg/dL — ABNORMAL HIGH (ref 70–99)
Glucose-Capillary: 343 mg/dL — ABNORMAL HIGH (ref 70–99)

## 2021-12-02 LAB — MRSA NEXT GEN BY PCR, NASAL: MRSA by PCR Next Gen: NOT DETECTED

## 2021-12-02 SURGERY — AMPUTATION, TOE
Anesthesia: General | Site: Toe | Laterality: Left

## 2021-12-02 MED ORDER — PHENYLEPHRINE 80 MCG/ML (10ML) SYRINGE FOR IV PUSH (FOR BLOOD PRESSURE SUPPORT)
PREFILLED_SYRINGE | INTRAVENOUS | Status: DC | PRN
  Administered 2021-12-02: 160 ug via INTRAVENOUS
  Administered 2021-12-02 (×2): 80 ug via INTRAVENOUS

## 2021-12-02 MED ORDER — LIDOCAINE HCL (PF) 2 % IJ SOLN
INTRAMUSCULAR | Status: AC
  Filled 2021-12-02: qty 5

## 2021-12-02 MED ORDER — PROPOFOL 10 MG/ML IV BOLUS
INTRAVENOUS | Status: DC | PRN
  Administered 2021-12-02: 140 mg via INTRAVENOUS

## 2021-12-02 MED ORDER — LIDOCAINE HCL (PF) 1 % IJ SOLN
INTRAMUSCULAR | Status: AC
  Filled 2021-12-02: qty 30

## 2021-12-02 MED ORDER — PHENYLEPHRINE 80 MCG/ML (10ML) SYRINGE FOR IV PUSH (FOR BLOOD PRESSURE SUPPORT)
PREFILLED_SYRINGE | INTRAVENOUS | Status: AC
  Filled 2021-12-02: qty 10

## 2021-12-02 MED ORDER — PROPOFOL 10 MG/ML IV BOLUS
INTRAVENOUS | Status: AC
  Filled 2021-12-02: qty 20

## 2021-12-02 MED ORDER — FENTANYL CITRATE (PF) 100 MCG/2ML IJ SOLN
INTRAMUSCULAR | Status: AC
  Filled 2021-12-02: qty 2

## 2021-12-02 MED ORDER — SODIUM CHLORIDE 0.9 % IR SOLN
Status: DC | PRN
  Administered 2021-12-02: 1

## 2021-12-02 MED ORDER — ONDANSETRON HCL 4 MG/2ML IJ SOLN
INTRAMUSCULAR | Status: DC | PRN
  Administered 2021-12-02: 4 mg via INTRAVENOUS

## 2021-12-02 MED ORDER — HYDROMORPHONE HCL 1 MG/ML IJ SOLN: 1.0000 mg | INTRAMUSCULAR | Status: DC | PRN

## 2021-12-02 MED ORDER — OXYCODONE HCL 5 MG PO TABS: 5.0000 mg | ORAL_TABLET | ORAL | Status: DC | PRN

## 2021-12-02 MED ORDER — FENTANYL CITRATE PF 50 MCG/ML IJ SOSY: 25.0000 ug | PREFILLED_SYRINGE | INTRAMUSCULAR | Status: DC | PRN

## 2021-12-02 MED ORDER — OXYCODONE HCL 5 MG/5ML PO SOLN: 5.0000 mg | Freq: Once | ORAL | Status: DC | PRN

## 2021-12-02 MED ORDER — OXYCODONE HCL 5 MG PO TABS: 5.0000 mg | ORAL_TABLET | Freq: Once | ORAL | Status: DC | PRN

## 2021-12-02 MED ORDER — LIDOCAINE HCL (CARDIAC) PF 100 MG/5ML IV SOSY
PREFILLED_SYRINGE | INTRAVENOUS | Status: DC | PRN
  Administered 2021-12-02: 60 mg via INTRAVENOUS

## 2021-12-02 MED ORDER — FENTANYL CITRATE (PF) 100 MCG/2ML IJ SOLN
INTRAMUSCULAR | Status: DC | PRN
  Administered 2021-12-02: 50 ug via INTRAVENOUS
  Administered 2021-12-02 (×2): 25 ug via INTRAVENOUS
  Administered 2021-12-02: 50 ug via INTRAVENOUS

## 2021-12-02 MED ORDER — FLUTICASONE PROPIONATE 50 MCG/ACT NA SUSP
1.0000 | Freq: Every day | NASAL | Status: DC
  Administered 2021-12-02 – 2021-12-08 (×7): 1 via NASAL
  Filled 2021-12-02: qty 16

## 2021-12-02 MED ORDER — ONDANSETRON HCL 4 MG/2ML IJ SOLN: 4.0000 mg | Freq: Once | INTRAMUSCULAR | Status: DC | PRN

## 2021-12-02 MED ORDER — DEXAMETHASONE SODIUM PHOSPHATE 4 MG/ML IJ SOLN
INTRAMUSCULAR | Status: DC | PRN
  Administered 2021-12-02: 4 mg via INTRAVENOUS

## 2021-12-02 SURGICAL SUPPLY — 40 items
BLADE OSC/SAGITTAL MD 9X18.5 (BLADE) IMPLANT
BNDG CMPR STD VLCR NS LF 5.8X4 (GAUZE/BANDAGES/DRESSINGS)
BNDG CONFORM 3 STRL LF (GAUZE/BANDAGES/DRESSINGS) IMPLANT
BNDG ELASTIC 4X5.8 VLCR NS LF (GAUZE/BANDAGES/DRESSINGS) IMPLANT
BNDG ELASTIC 4X5.8 VLCR STR LF (GAUZE/BANDAGES/DRESSINGS) IMPLANT
BNDG GAUZE DERMACEA FLUFF 4 (GAUZE/BANDAGES/DRESSINGS) IMPLANT
BNDG GAUZE ELAST 4 BULKY (GAUZE/BANDAGES/DRESSINGS) IMPLANT
BNDG GZE DERMACEA 4 6PLY (GAUZE/BANDAGES/DRESSINGS) ×1
CLOTH BEACON ORANGE TIMEOUT ST (SAFETY) ×2 IMPLANT
COVER LIGHT HANDLE STERIS (MISCELLANEOUS) ×4 IMPLANT
ELECT REM PT RETURN 9FT ADLT (ELECTROSURGICAL) ×1
ELECTRODE REM PT RTRN 9FT ADLT (ELECTROSURGICAL) ×2 IMPLANT
GAUZE SPONGE 4X4 12PLY STRL (GAUZE/BANDAGES/DRESSINGS) ×2 IMPLANT
GAUZE XEROFORM 1X8 LF (GAUZE/BANDAGES/DRESSINGS) IMPLANT
GAUZE XEROFORM 5X9 LF (GAUZE/BANDAGES/DRESSINGS) IMPLANT
GLOVE BIOGEL PI IND STRL 7.0 (GLOVE) ×4 IMPLANT
GLOVE SURG SS PI 7.5 STRL IVOR (GLOVE) ×2 IMPLANT
GOWN STRL REUS W/TWL LRG LVL3 (GOWN DISPOSABLE) ×6 IMPLANT
INST SET MINOR BONE (KITS) ×2 IMPLANT
KIT TURNOVER KIT A (KITS) ×2 IMPLANT
MANIFOLD NEPTUNE II (INSTRUMENTS) ×2 IMPLANT
NDL HYPO 18GX1.5 BLUNT FILL (NEEDLE) ×2 IMPLANT
NDL HYPO 21X1.5 SAFETY (NEEDLE) ×2 IMPLANT
NEEDLE HYPO 18GX1.5 BLUNT FILL (NEEDLE) ×1 IMPLANT
NEEDLE HYPO 21X1.5 SAFETY (NEEDLE) ×1 IMPLANT
NS IRRIG 1000ML POUR BTL (IV SOLUTION) ×2 IMPLANT
PACK BASIC LIMB (CUSTOM PROCEDURE TRAY) ×2 IMPLANT
PAD ARMBOARD 7.5X6 YLW CONV (MISCELLANEOUS) ×2 IMPLANT
SET BASIN LINEN APH (SET/KITS/TRAYS/PACK) ×2 IMPLANT
SOL PREP PROV IODINE SCRUB 4OZ (MISCELLANEOUS) ×2 IMPLANT
SPONGE GAUZE 2X2 8PLY STRL LF (GAUZE/BANDAGES/DRESSINGS) ×2 IMPLANT
SPONGE T-LAP 18X18 ~~LOC~~+RFID (SPONGE) ×2 IMPLANT
SUT ETHILON 3 0 FSL (SUTURE) IMPLANT
SUT PROLENE 2 0 FS (SUTURE) IMPLANT
SUT VIC AB 3-0 SH 27 (SUTURE)
SUT VIC AB 3-0 SH 27XBRD (SUTURE) IMPLANT
SWAB CULTURE ESWAB REG 1ML (MISCELLANEOUS) IMPLANT
SYR 30ML LL (SYRINGE) ×2 IMPLANT
SYR BULB IRRIG 60ML STRL (SYRINGE) ×2 IMPLANT
SYR CONTROL 10ML LL (SYRINGE) ×2 IMPLANT

## 2021-12-02 NOTE — Consult Note (Signed)
   [facility] CM Inpatient Consult   12/02/2021  [name] Jun 14, 1950 [id number]   [place] Organization [ACO] Patient: [facility] Liaison remote coverage review for [name]   Primary Care Provider: Sharilyn Sites, MD, [facility], is an Independent Embedded provider with a Care Management team and program and is listed for the [facility] follow up needs     Patient screened for less than 30 days readmission hospitalization with noted extreme high risk score for unplanned readmission risk.  Reviewed to assess for potential [place] [facility] service needs for post hospital transition for readmission prevention needs.  Review of patient's medical record reveals patient has had outreach attempts by [facility] and [place].  Patient for surgery today noted    Plan:  Continue to follow progress and disposition to assess for post hospital care management needs.  Referral request can be made pending disposition and needs.   For questions contact:    Natividad Brood, RN BSN [place]  [phone] business mobile phone Toll free office [phone]  *[place]  [phone] Fax number: [phone] [email or web address]

## 2021-12-02 NOTE — Transfer of Care (Signed)
Immediate Anesthesia Transfer of Care Note  Patient: [name]  Procedure(s) Performed: AMPUTATION TOE (Left: Toe)  Patient Location: PACU  Anesthesia Type:General  Level of Consciousness: drowsy  Airway & Oxygen Therapy: Patient Spontanous Breathing and Patient connected to face mask oxygen  Post-op Assessment: Report given to RN and Post -op Vital signs reviewed and stable  Post vital signs: Reviewed and stable  Last Vitals:  Vitals Value Taken Time  BP 111/58 12/02/21 1256  Temp    Pulse 90 12/02/21 1257  Resp 15 12/02/21 1256  SpO2 100 % 12/02/21 1257  Vitals shown include unvalidated device data.  Last Pain:  Vitals:   12/02/21 0913  TempSrc:   PainSc: 0-No pain         Complications: No notable events documented.

## 2021-12-02 NOTE — Anesthesia Preprocedure Evaluation (Signed)
Anesthesia Evaluation  Patient identified by MRN, date of birth, ID band Patient awake    Reviewed: Allergy & Precautions, H&P , NPO status , Patient's Chart, lab work & pertinent test results, reviewed documented beta blocker date and time   Airway Mallampati: II  TM Distance: >3 FB Neck ROM: full    Dental no notable dental hx.    Pulmonary neg pulmonary ROS,    Pulmonary exam normal breath sounds clear to auscultation       Cardiovascular Exercise Tolerance: Good hypertension, negative cardio ROS   Rhythm:regular Rate:Normal     Neuro/Psych PSYCHIATRIC DISORDERS Anxiety Depression negative neurological ROS     GI/Hepatic Neg liver ROS, GERD  Medicated,  Endo/Other  negative endocrine ROSdiabetes, Type 2  Renal/GU CRFRenal disease  negative genitourinary   Musculoskeletal   Abdominal   Peds  Hematology negative hematology ROS (+)   Anesthesia Other Findings   Reproductive/Obstetrics negative OB ROS                             Anesthesia Physical Anesthesia Plan  ASA: 3  Anesthesia Plan: General and General LMA   Post-op Pain Management:    Induction:   PONV Risk Score and Plan:   Airway Management Planned:   Additional Equipment:   Intra-op Plan:   Post-operative Plan:   Informed Consent: I have reviewed the patients History and Physical, chart, labs and discussed the procedure including the risks, benefits and alternatives for the proposed anesthesia with the patient or authorized representative who has indicated his/her understanding and acceptance.     Dental Advisory Given  Plan Discussed with: CRNA  Anesthesia Plan Comments:         Anesthesia Quick Evaluation

## 2021-12-02 NOTE — Progress Notes (Signed)
   Subjective:  Patient reports that he is felling okay but still endorses a sore throat. 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 No acute events overnight.  Objective:  Vital signs in last 24 hours: Vitals:   11/30/21 1958 12/01/21 0529 12/01/21 1405 12/01/21 2039  BP: (!) 130/57 (!) 108/58 123/70 136/63  Pulse: (!) 111 (!) 103 99 (!) 107  Resp: '19 20 17 16  '$ Temp: (!) 96.9 F (36.1 C) (!) 97.3 F (36.3 C) 98 F (36.7 C) 98.4 F (36.9 C)  TempSrc:      SpO2: 95% 99% 95% 98%  Weight:      Height:       Weight change:   Intake/Output Summary (Last 24 hours) at 12/02/2021 0729 Last data filed at 12/02/2021 0620 Gross per 24 hour  Intake 360 ml  Output 2000 ml  Net -1640 ml   General: Lying in bed in no acute distress Head: Normocephalic, atraumatic Cardiovascular: tachycardic, without murmurs, rubs, or gallops, no LEE Respiratory: Normal work of breathing, lungs CTAB Abdominal: normoactive bowel sounds, no tenderness to palpation Skin: warm, dry Neurocognitive: AAO x 3 Psychiatric: Normal mood and affect.   Assessment/Plan:  Principal Problem:   Sepsis ([place]) Active Problems:   Hypertension   Anxiety   DM (diabetes mellitus) ([place])   Osteomyelitis (HCC)   Amputated great toe of right foot ([place])   Stage 3a chronic kidney disease ([place])   AKI (acute kidney injury) ([place])   Hyperlipidemia   Bedbug bite with infection   Osteomyelitis of great toe of left foot ([place])   Assessment and Plan:   Sepsis secondary to Osteomyelitis of Left Great Toe Patient meets sepsis criteria with tachycardia and WBC of 17.2, but numbers continuing to trend down. Patient is scheduled for left great toe amputation this afternoon. Patient is being treated with IV Rocephin 2 g (Day 2) with total antibiotic duration to be determined after surgery and blood culture results. Initial blood cultures on admission were positive for Strep. agalactiae and repeat blood cultures are pending.  Patient continues to be NPO. -Continue to monitor vitals and CBC -Continue Rocephin  Pharyngitis Patient has a history of Hay Fever. Patient has no other symptoms and remains afebrile. Flonase ordered for symptom relief. -CTM   DM (diabetes mellitus) ([place]) A1C from 9/23 is 6.8. CBG currently at 141. Yesterday, Levemir was increased from 10 to 15 u QHS and Novolog changed to 5 u TID with meals due to elevated CBG's.  -Continue Levemir 15 u QHS -Continue Novolog TID with SSI  Hypoalbuminemia Albumin is 2.0 which has been trending down since admission. Patient reports good appetite. Glucerna Shake TID initiated 10/17. -CTM  Hyperlipidemia - Continue Lipitor - Heart healthy diet (patient currently NPO)  AKI on CKD Stage 3a Resolved   Anxiety -Continue Zoloft and Bupropion   Hypertension -Currently stable   Bedbug bite with infection Resolved   LOS: 4 Days  Stanford Breed, Medical Student 12/02/2021, 7:29 AM

## 2021-12-02 NOTE — Interval H&P Note (Signed)
History and Physical Interval Note:  12/02/2021 11:41 AM  [name]  has presented today for surgery, with the diagnosis of Left great toe osteomyelitis.  The various methods of treatment have been discussed with the patient and family. After consideration of risks, benefits and other options for treatment, the patient has consented to  Procedure(s): AMPUTATION TOE (Left) as a surgical intervention.  The patient's history has been reviewed, patient examined, no change in status, stable for surgery.  I have reviewed the patient's chart and labs.  Questions were answered to the patient's satisfaction.     Aviva Signs

## 2021-12-02 NOTE — Anesthesia Procedure Notes (Signed)
Procedure Name: LMA Insertion Date/Time: 12/02/2021 12:14 PM  Performed by: Lieutenant Diego, CRNAPre-anesthesia Checklist: Patient identified, Emergency Drugs available, Suction available and Patient being monitored Patient Re-evaluated:Patient Re-evaluated prior to induction Oxygen Delivery Method: Circle system utilized Preoxygenation: Pre-oxygenation with 100% oxygen Induction Type: IV induction Ventilation: Mask ventilation without difficulty LMA: LMA inserted LMA Size: 5.0 Number of attempts: 1 Placement Confirmation: positive ETCO2 Tube secured with: Tape Dental Injury: Teeth and Oropharynx as per pre-operative assessment

## 2021-12-02 NOTE — Progress Notes (Signed)
Pt has remained NPO since midnight for upcoming surgery of L great toe amputation later today. Consent in chart. Pt pleasant and cooperative, no c/o pain or discomfort noted. Resting comfortably in bed at this time. Will continue to monitor.

## 2021-12-02 NOTE — Op Note (Signed)
Patient:  [name]  DOB:  Dec 19, 1950  MRN:  [id number]   Preop Diagnosis: Osteomyelitis with abscess, left great toe  Postop Diagnosis: Same  Procedure: Left great toe amputation  Surgeon: Aviva Signs, MD  Anes: General  Indications: Patient is a 71 year old white male status post right great toe amputation in the remote past who suffers from diabetes mellitus who presents with osteomyelitis and fluid in the left great toe.  This fluid surrounding the metatarsal interphalangeal joint.  The risks and benefits of the procedure were fully explained to the patient, who gave informed consent.  He was told that I am I have to leave the wound open due to the infection that is present.  Procedure note: The patient was placed in supine position.  After general anesthesia was administered, the left foot was prepped and draped using the usual sterile technique with Betadine.  Surgical site confirmation was performed.  An elliptical incision was made at the base of the left great toe.  Upon entering the soft tissue, a significant amount of purulent fluid was found.  Aerobic and anaerobic cultures were taken and sent to microbiology for further evaluation.  I did dissect down to the metatarsal phalangeal joint.  The toe was then disarticulated and sent to pathology for further examination and treatment.  Any necrotic tissue was debrided sharply with scissors and with a curette.  The wound was copiously irrigated normal saline.  I had to leave the wound open due to the infected nature of the wound.  Xeroform was packed into the wound.  A dry sterile dressing was then applied.  All tape and needle counts were correct at the end of the procedure.  The patient was awakened and transferred to PACU in stable condition.  Complications: None  EBL: Minimal  Specimen: Left great toe

## 2021-12-03 DIAGNOSIS — N179 Acute kidney failure, unspecified: Secondary | ICD-10-CM | POA: Diagnosis not present

## 2021-12-03 DIAGNOSIS — R6520 Severe sepsis without septic shock: Secondary | ICD-10-CM | POA: Diagnosis not present

## 2021-12-03 DIAGNOSIS — A419 Sepsis, unspecified organism: Secondary | ICD-10-CM | POA: Diagnosis not present

## 2021-12-03 LAB — MAGNESIUM: Magnesium: 2 mg/dL (ref 1.7–2.4)

## 2021-12-03 LAB — CBC
HCT: 24.3 % — ABNORMAL LOW (ref 39.0–52.0)
Hemoglobin: 8 g/dL — ABNORMAL LOW (ref 13.0–17.0)
MCH: 28.3 pg (ref 26.0–34.0)
MCHC: 32.9 g/dL (ref 30.0–36.0)
MCV: 85.9 fL (ref 80.0–100.0)
Platelets: 386 10*3/uL (ref 150–400)
RBC: 2.83 MIL/uL — ABNORMAL LOW (ref 4.22–5.81)
RDW: 12.8 % (ref 11.5–15.5)
WBC: 15.6 10*3/uL — ABNORMAL HIGH (ref 4.0–10.5)
nRBC: 0 % (ref 0.0–0.2)

## 2021-12-03 LAB — GLUCOSE, CAPILLARY
Glucose-Capillary: 217 mg/dL — ABNORMAL HIGH (ref 70–99)
Glucose-Capillary: 206 mg/dL — ABNORMAL HIGH (ref 70–99)
Glucose-Capillary: 163 mg/dL — ABNORMAL HIGH (ref 70–99)
Glucose-Capillary: 190 mg/dL — ABNORMAL HIGH (ref 70–99)

## 2021-12-03 LAB — CULTURE, BLOOD (ROUTINE X 2)

## 2021-12-03 LAB — COMPREHENSIVE METABOLIC PANEL
ALT: 19 U/L (ref 0–44)
AST: 21 U/L (ref 15–41)
Albumin: 2.1 g/dL — ABNORMAL LOW (ref 3.5–5.0)
Alkaline Phosphatase: 77 U/L (ref 38–126)
Anion gap: 10 (ref 5–15)
BUN: 19 mg/dL (ref 8–23)
CO2: 29 mmol/L (ref 22–32)
Calcium: 8.2 mg/dL — ABNORMAL LOW (ref 8.9–10.3)
Chloride: 96 mmol/L — ABNORMAL LOW (ref 98–111)
Creatinine, Ser: 1.28 mg/dL — ABNORMAL HIGH (ref 0.61–1.24)
GFR, Estimated: >60 mL/min (ref >60)
Glucose, Bld: 236 mg/dL — ABNORMAL HIGH (ref 70–99)
Potassium: 4 mmol/L (ref 3.5–5.1)
Sodium: 135 mmol/L (ref 135–145)
Total Bilirubin: 0.4 mg/dL (ref 0.3–1.2)
Total Protein: 5.8 g/dL — ABNORMAL LOW (ref 6.5–8.1)

## 2021-12-03 MED ORDER — INSULIN DETEMIR 100 UNIT/ML ~~LOC~~ SOLN
20.0000 [IU] | Freq: Every day | SUBCUTANEOUS | Status: DC
  Administered 2021-12-03 – 2021-12-07 (×5): 20 [IU] via SUBCUTANEOUS
  Filled 2021-12-03 (×6): qty 0.2

## 2021-12-03 MED ORDER — INSULIN ASPART 100 UNIT/ML IJ SOLN
7.0000 [IU] | Freq: Three times a day (TID) | INTRAMUSCULAR | Status: DC
  Administered 2021-12-03 – 2021-12-08 (×16): 7 [IU] via SUBCUTANEOUS

## 2021-12-03 MED ORDER — ORAL CARE MOUTH RINSE: 15.0000 mL | OROMUCOSAL | Status: DC | PRN

## 2021-12-03 NOTE — TOC Progression Note (Signed)
Transition of Care [facility]) - Progression Note    Patient Details  Name: [name] MRN: [id number] Date of Birth: 1950/09/23  Transition of Care [facility]) CM/SW [place], [place] Phone Number: 12/03/2021, 12:04 PM  Clinical Narrative:    CSW spoke with pt about PT recommendation for [facility] PT. Pt states that he does not want anyone coming into the home until he has been able to do some cleaning. Pt does not want [place] referral at this time. CSW inquired about pts interest in rolling walker, pt would like this. CSW to make referral to [place] with [employer]. TOC to follow.   Expected Discharge Plan: [place] Barriers to Discharge: Continued Medical Work up  Expected Discharge Plan and Services Expected Discharge Plan: [place] In-house Referral: Clinical Social Work Discharge Planning Services: CM Consult Post Acute Care Choice: Durable Medical Equipment, Home Health Living arrangements for the past 2 months: Single Family Home                                       Social Determinants of Health (SDOH) Interventions    Readmission Risk Interventions    11/30/2021    2:51 PM  Readmission Risk Prevention Plan  Transportation Screening Complete  HRI or [place] Complete  Social Work Consult for [place] Planning/Counseling Complete  Palliative Care Screening Not Applicable  Medication Review [employer]) Complete

## 2021-12-03 NOTE — Anesthesia Postprocedure Evaluation (Signed)
Anesthesia Post Note  Patient: [name]  Procedure(s) Performed: AMPUTATION TOE (Left: Toe)  Patient location during evaluation: Phase II Anesthesia Type: General Level of consciousness: awake Pain management: pain level controlled Vital Signs Assessment: post-procedure vital signs reviewed and stable Respiratory status: spontaneous breathing and respiratory function stable Cardiovascular status: blood pressure returned to baseline and stable Postop Assessment: no headache and no apparent nausea or vomiting Anesthetic complications: no Comments: Late entry   No notable events documented.   Last Vitals:  Vitals:   12/02/21 2135 12/03/21 0609  BP: 135/62 132/65  Pulse: 89 80  Resp: 20 19  Temp: 37.8 C (!) 36.1 C  SpO2: 92% 97%    Last Pain:  Vitals:   12/02/21 2135  TempSrc: Oral  PainSc: [place]

## 2021-12-03 NOTE — Evaluation (Signed)
Physical Therapy Evaluation Patient Details Name: [name] MRN: [id number] DOB: 04-Oct-1950 Today's Date: 12/03/2021  History of Present Illness  [name] is a 71 y.o. male with medical history significant of cognitive impairment, anxiety, diabetes mellitus type 2, GERD, hyperlipidemia, hypertension, osteomyelitis with right great toe amputation, renal insufficiency, and more presents the ED with a chief complaint of feeling hot and vomiting.  Patient reports he laid down around 5 PM.  He woke up from his nap and he was diaphoretic, flushed, and nauseous.  He reports 1 episode of vomiting.  It was nonbloody.  Patient reports he checked his sugar and it was 300.  He reports that his sugar is usually between 100 and 200.  Patient reports he takes 20 units of insulin at home.  He reports that he has had polyuria, and polydipsia today.  He denies any polyphasia.  He reports he has had a fever at home.  Tylenol has helped it.  He has had a headache and is felt dizzy.  He reports the headache is feeling "swimmy headed."  Patient reports that he had swelling in his left great toe for 2 or 3 months.  He reports that Sunday he had purulent drainage.  He had no trauma to the toe.  He does not have any pain in that toe.  Patient has no other complaints at this time.   Clinical Impression  Patient limited for functional mobility as stated below secondary to impaired activity tolerance and balance. Patient able to transfer to standing and ambulate without AD but is unsteady and reaches for support on walls/objects. Patient's balance and gait improves with use of RW for support and patient given cueing/education for avoiding weightbearing on amputation site of L great toe. Patient would benefit from returning home with RW to improve balance and reduce the risk for falls. Patient will benefit from continued physical therapy in hospital and recommended venue below to increase strength, balance, endurance for  safe ADLs and gait.        Recommendations for follow up therapy are one component of a multi-disciplinary discharge planning process, led by the attending physician.  Recommendations may be updated based on patient status, additional functional criteria and insurance authorization.  Follow Up Recommendations Home health PT      Assistance Recommended at Discharge Intermittent Supervision/Assistance  Patient can return home with the following  Assistance with cooking/housework;Assist for transportation    Equipment Recommendations Rolling walker (2 wheels)  Recommendations for Other Services       Functional Status Assessment Patient has had a recent decline in their functional status and demonstrates the ability to make significant improvements in function in a reasonable and predictable amount of time.     Precautions / Restrictions Precautions Precautions: Fall Restrictions Weight Bearing Restrictions: No      Mobility  Bed Mobility Overal bed mobility: Modified Independent                  Transfers Overall transfer level: Needs assistance Equipment used: Rolling walker (2 wheels), None Transfers: Sit to/from Stand, Bed to chair/wheelchair/BSC Sit to Stand: Min guard   Step pivot transfers: Min guard       General transfer comment: able to transfer to standing without phsyical assist with RW, slightly unsteady upon standing, also able to perform transfer without AD but increased unsteadiness    Ambulation/Gait Ambulation/Gait assistance: Min guard Gait Distance (Feet): 200 Feet Assistive device: Rolling walker (2 wheels), None Gait Pattern/deviations:  Step-through pattern, Decreased stride length Gait velocity: decreased     General Gait Details: able to ambulate without AD, improved balance with RW; cueing for avoiding weightbearing on amputation site  Stairs            Wheelchair Mobility    Modified Rankin (Stroke Patients Only)        Balance Overall balance assessment: Needs assistance Sitting-balance support: No upper extremity supported, Feet supported Sitting balance-Leahy Scale: Good     Standing balance support: Bilateral upper extremity supported Standing balance-Leahy Scale: Good Standing balance comment: with RW                             Pertinent Vitals/Pain Pain Assessment Pain Assessment: No/denies pain    Home Living Family/patient expects to be discharged to:: Private residence Living Arrangements: Alone Available Help at Discharge: Family Type of Home: House Home Access: Level entry       Home Layout: One level Home Equipment: None      Prior Function Prior Level of Function : Independent/Modified Independent             Mobility Comments: states community ambulation without AD ADLs Comments: states independent with basic ADL     Hand Dominance        Extremity/Trunk Assessment   Upper Extremity Assessment Upper Extremity Assessment: Overall WFL for tasks assessed    Lower Extremity Assessment Lower Extremity Assessment: Overall WFL for tasks assessed    Cervical / Trunk Assessment Cervical / Trunk Assessment: Normal  Communication   Communication: Expressive difficulties  Cognition Arousal/Alertness: Awake/alert Behavior During Therapy: WFL for tasks assessed/performed Overall Cognitive Status: Within Functional Limits for tasks assessed                                          General Comments      Exercises     Assessment/Plan    PT Assessment Patient needs continued PT services  PT Problem List Decreased mobility;Decreased activity tolerance;Decreased balance;Decreased knowledge of use of DME       PT Treatment Interventions DME instruction;Therapeutic exercise;Gait training;Balance training;Stair training;Neuromuscular re-education;Functional mobility training;Therapeutic activities;Patient/family education    PT  Goals (Current goals can be found in the Care Plan section)  Acute Rehab PT Goals Patient Stated Goal: Return home PT Goal Formulation: With patient Time For Goal Achievement: 12/10/21 Potential to Achieve Goals: Good    Frequency Min 3X/week     Co-evaluation               AM-PAC PT "6 Clicks" Mobility  Outcome Measure Help needed turning from your back to your side while in a flat bed without using bedrails?: None Help needed moving from lying on your back to sitting on the side of a flat bed without using bedrails?: None Help needed moving to and from a bed to a chair (including a wheelchair)?: A Little Help needed standing up from a chair using your arms (e.g., wheelchair or bedside chair)?: A Little Help needed to walk in hospital room?: A Little Help needed climbing 3-5 steps with a railing? : A Little 6 Click Score: 20    End of Session Equipment Utilized During Treatment: Gait belt Activity Tolerance: Patient tolerated treatment well Patient left: in chair;with nursing/sitter in room;with call bell/phone within reach;with chair alarm set Nurse Communication:  Mobility status PT Visit Diagnosis: Unsteadiness on feet (R26.81);Other abnormalities of gait and mobility (R26.89);History of falling (Z91.81)    Time: 9507-2257 PT Time Calculation (min) (ACUTE ONLY): 38 min   Charges:   PT Evaluation $PT Eval Low Complexity: 1 Low PT Treatments $Therapeutic Activity: 23-37 mins        11:30 AM, 12/03/21 Mearl Latin PT, DPT Physical Therapist at [facility]

## 2021-12-03 NOTE — Progress Notes (Signed)
   Subjective:  Patient is feeling well overall and states his foot is "just a little sore".  Patient states his throat discomfort has improved with Flonase. No acute events overnight.  Objective:  Vital signs in last 24 hours: Vitals:   12/02/21 1344 12/02/21 1812 12/02/21 2135 12/03/21 0609  BP: 137/68 137/62 135/62 132/65  Pulse: 88 99 89 80  Resp: '18 18 20 19  '$ Temp: 99.1 F (37.3 C) 98.4 F (36.9 C) 100 F (37.8 C) (!) 96.9 F (36.1 C)  TempSrc: Oral Oral Oral   SpO2: 98% 98% 92% 97%  Weight:      Height:       Weight change:   Intake/Output Summary (Last 24 hours) at 12/03/2021 7867 Last data filed at 12/03/2021 6720 Gross per 24 hour  Intake 977.46 ml  Output 2660 ml  Net -1682.54 ml   General: Lying in bed in no acute distress Head: Normocephalic, atraumatic Cardiovascular: regular rate and rhythm, without murmurs, rubs, or gallops, no LEE Respiratory: Normal work of breathing, lungs CTAB Abdominal: normoactive bowel sounds, no tenderness to palpation Skin: warm, dry Extremities: left foot bandaged but visible digits without erythema or significant swelling Neurocognitive: AAO x 3 Psychiatric: Normal mood and affect.   Assessment/Plan:  Principal Problem:   Sepsis ([place]) Active Problems:   Hypertension   Anxiety   DM (diabetes mellitus) ([place])   Osteomyelitis (HCC)   Amputated great toe of right foot ([place])   Stage 3a chronic kidney disease ([place])   AKI (acute kidney injury) ([place])   Hyperlipidemia   Bedbug bite with infection   Osteomyelitis of great toe of left foot ([place])   Assessment and Plan:   Sepsis secondary to Osteomyelitis of Left Great Toe Patient not currently meeting sepsis criteria. Patient continues to have leukocytosis of 15.6 (17.3 yesterday) but all other vitals are stable. Patient underwent left great toe amputation yesterday afternoon without complication. Purulent drainage was noted upon entering the soft tissue so aerobic/anaerobic  gram stain and cultures were taken. Gram stain showed abundant WBC, abundant gram negative rods, and few gram positive cocci in pairs and chains. Wound culture is pending. Initial blood cultures on admission were positive for Strep. agalactiae but repeat blood cultures show no growth after 2 days. Patient is being treated with IV Rocephin 2 g (Day 3) with total antibiotic duration to be determined after wound culture results. Patient is no longer NPO so IV fluids discontinued and PT/OT consulted to evaluate needs. -Continue to monitor vitals and CBC -Continue Rocephin   Pharyngitis Patient has a history of Hay Fever. Patient has no other symptoms and remains afebrile. Flonase ordered yesterday and patient states symptoms have improved.  DM (diabetes mellitus) ([place]) A1C from 9/23 is 6.8. CBG currently at 217. Yesterday, Levemir increased to 20 u QHS and Novolog increased to 7 u TID with meals.  -Continue Levemir 20 u QHS -Continue Novolog TID with SSI   Hypoalbuminemia Albumin is 2.1 (2.0 yesterday). Patient reports good appetite and has been having Glucerna shakes TID. -Continue to monitor   Hyperlipidemia - Continue Lipitor - Heart healthy diet   AKI on CKD Stage 3a Resolved   Anxiety -Continue Zoloft and Bupropion   Hypertension -Currently stable   Bedbug bite with infection Resolved  LOS: 5 Days  Stanford Breed, Medical Student 12/03/2021, 8:42 AM

## 2021-12-03 NOTE — Plan of Care (Signed)
  Problem: Acute Rehab PT Goals(only PT should resolve) Goal: Patient Will Transfer Sit To/From Stand Outcome: Progressing Flowsheets (Taken 12/03/2021 1131) Patient will transfer sit to/from stand: with modified independence Goal: Pt Will Transfer Bed To Chair/Chair To Bed Outcome: Progressing Flowsheets (Taken 12/03/2021 1131) Pt will Transfer Bed to Chair/Chair to Bed: with modified independence Goal: Pt Will Ambulate Outcome: Progressing Flowsheets (Taken 12/03/2021 1131) Pt will Ambulate:  > 125 feet  with modified independence  with least restrictive assistive device Goal: Pt/caregiver will Perform Home Exercise Program Outcome: Progressing Flowsheets (Taken 12/03/2021 1131) Pt/caregiver will Perform Home Exercise Program:  For increased strengthening  For improved balance  Independently  11:32 AM, 12/03/21 Mearl Latin PT, DPT Physical Therapist at [facility]

## 2021-12-03 NOTE — Care Management Important Message (Signed)
Important Message  Patient Details  Name: [name] MRN: [id number] Date of Birth: [birth year]   Medicare Important Message Given:  Yes     Tommy Medal 12/03/2021, 4:11 PM

## 2021-12-03 NOTE — Consult Note (Signed)
Reason for Consult:Osteomyelitis Referring Physician: Dr. Carlynn Purl  [name] is an 71 y.o. male POD#1 from amputation of left great toe for osteomyelitis.  HPI: No acute events overnight. Patient reports foot pain is mildly sore, but has been ambulating with no issue. Patient endorses tolerating solid diet well and voiding spontaneously. Patient denies bowel movement, denies nausea, vomiting, fever, or malaise.   Past Medical History:  Diagnosis Date   Anxiety    Arthritis    Colon polyps    Depression    Diabetes mellitus without complication (HCC)    Fatty liver    GERD (gastroesophageal reflux disease)    Gout    High cholesterol    History of echocardiogram 09/14/2011   EF >55%; borderline concentric LVH;    History of stress test 06/21/2010   exercise; normal study   Hypertension    MET TEST 08/15/2011   moderate peak VO2 limitation at 66% predicted; mod cardiac impairment with low SV & low anaerobic threshold, mild chronotropic incompetence; low risk   Nausea    Osteomyelitis (HCC)    right great toe   Renal disorder    Renal insufficiency    Thrombocytopenia ([place])    PMH    Past Surgical History:  Procedure Laterality Date   AMPUTATION Right 02/13/2019   Procedure: RIGHT GREAT TOE AMPUTATION;  Surgeon: [name], MD;  Location: [facility];  Service: Orthopedics;  Laterality: Right;   CHOLECYSTECTOMY     COLONOSCOPY N/A 09/21/2012   Procedure: COLONOSCOPY;  Surgeon: Rogene Houston, MD;  [facility];  Service: Endoscopy;  Laterality: N/A;  730   COLONOSCOPY N/A 01/30/2019   rehman, eight 4-7 mm polyps in rectum in sigmoid and at splenic flexure, transverse colon and in cecum, one 1m polyp in recto-sigmoid colon, clip was placed, nine polyps total removed   ESOPHAGOGASTRODUODENOSCOPY  11/2005   normal mucosa and GE junction   HERNIA REPAIR     x 2   HIP SURGERY     Rt hip age 71  NASAL POLYP SURGERY      2-3 months ago   POLYPECTOMY   01/30/2019   Procedure: POLYPECTOMY;  Surgeon: RRogene Houston MD;  [facility];  Service: Endoscopy;;  proximal transverse colon, cecal    Family History  Problem Relation Age of Onset   Colon cancer Brother     Social History:  reports that he has never smoked. He has never used smokeless tobacco. He reports that he does not drink alcohol and does not use drugs.  Allergies:  Allergies  Allergen Reactions   Penicillins Other (See Comments)    Unknown Did it involve swelling of the face/tongue/throat, SOB, or low BP? Unknown Did it involve sudden or severe rash/hives, skin peeling, or any reaction on the inside of your mouth or nose? Unknown Did you need to seek medical attention at a hospital or doctor's office? Unknown When did it last happen?      childhood allergy If all above answers are "NO", may proceed with cephalosporin use.    Ivp Dye [Iodinated Contrast Media]     Sped heart reate up    Tetanus Toxoids Rash    Medications: I have reviewed the patient's current medications.  Results for orders placed or performed during the hospital encounter of 11/29/21 (from the past 48 hour(s))  Glucose, capillary     Status: Abnormal   Collection Time: 12/01/21 11:12 AM  Result Value  Ref Range   Glucose-Capillary 220 (H) 70 - 99 mg/dL    Comment: Glucose reference range applies only to samples taken after fasting for at least 8 hours.  Glucose, capillary     Status: Abnormal   Collection Time: 12/01/21  4:16 PM  Result Value Ref Range   Glucose-Capillary 293 (H) 70 - 99 mg/dL    Comment: Glucose reference range applies only to samples taken after fasting for at least 8 hours.  Glucose, capillary     Status: Abnormal   Collection Time: 12/01/21 10:09 PM  Result Value Ref Range   Glucose-Capillary 190 (H) 70 - 99 mg/dL    Comment: Glucose reference range applies only to samples taken after fasting for at least 8 hours.  CBC     Status: Abnormal   Collection  Time: 12/02/21  5:18 AM  Result Value Ref Range   WBC 17.2 (H) 4.0 - 10.5 K/uL   RBC 2.69 (L) 4.22 - 5.81 MIL/uL   Hemoglobin 7.6 (L) 13.0 - 17.0 g/dL   HCT 22.9 (L) 39.0 - 52.0 %   MCV 85.1 80.0 - 100.0 fL   MCH 28.3 26.0 - 34.0 pg   MCHC 33.2 30.0 - 36.0 g/dL   RDW 12.7 11.5 - 15.5 %   Platelets 341 150 - 400 K/uL   nRBC 0.0 0.0 - 0.2 %    Comment: Performed at [facility], [address] [place], [place] [address]  Comprehensive metabolic panel     Status: Abnormal   Collection Time: 12/02/21  5:18 AM  Result Value Ref Range   Sodium 131 (L) 135 - 145 mmol/L   Potassium 3.8 3.5 - 5.1 mmol/L   Chloride 97 (L) 98 - 111 mmol/L   CO2 28 22 - 32 mmol/L   Glucose, Bld 141 (H) 70 - 99 mg/dL    Comment: Glucose reference range applies only to samples taken after fasting for at least 8 hours.   BUN 15 8 - 23 mg/dL   Creatinine, Ser 1.35 (H) 0.61 - 1.24 mg/dL   Calcium 8.0 (L) 8.9 - 10.3 mg/dL   Total Protein 5.6 (L) 6.5 - 8.1 g/dL   Albumin 2.0 (L) 3.5 - 5.0 g/dL   AST 13 (L) 15 - 41 U/L   ALT 15 0 - 44 U/L   Alkaline Phosphatase 73 38 - 126 U/L   Total Bilirubin 0.7 0.3 - 1.2 mg/dL   GFR, Estimated 56 (L) >60 mL/min    Comment: (NOTE) Calculated using the CKD-EPI Creatinine Equation (2021)    Anion gap 6 5 - 15    Comment: Performed at [facility], [address] [place], [place] [address]  Glucose, capillary     Status: Abnormal   Collection Time: 12/02/21  7:34 AM  Result Value Ref Range   Glucose-Capillary 130 (H) 70 - 99 mg/dL    Comment: Glucose reference range applies only to samples taken after fasting for at least 8 hours.  MRSA Next Gen by PCR, Nasal     Status: None   Collection Time: 12/02/21 10:30 AM   Specimen: Nasal Mucosa; Nasal Swab  Result Value Ref Range   MRSA by PCR Next Gen NOT DETECTED NOT DETECTED    Comment: (NOTE) The GeneXpert MRSA Assay (FDA approved for NASAL specimens only), is one component of a comprehensive MRSA colonization  surveillance program. It is not intended to diagnose MRSA infection nor to guide or monitor treatment for MRSA infections. Test performance is  not FDA approved in patients less than 15 years old. Performed at [facility], [address], [place], [place] [address]   Glucose, capillary     Status: Abnormal   Collection Time: 12/02/21 10:55 AM  Result Value Ref Range   Glucose-Capillary 114 (H) 70 - 99 mg/dL    Comment: Glucose reference range applies only to samples taken after fasting for at least 8 hours.  Aerobic/Anaerobic Culture w Gram Stain (surgical/deep wound)     Status: None (Preliminary result)   Collection Time: 12/02/21 12:31 PM   Specimen: Wound  Result Value Ref Range   Specimen Description      WOUND Performed at [facility], [address] [place], [place] [address]    Special Requests      NONE Performed at [facility], [address] [place], [place] [address]    Gram Stain      ABUNDANT WBC PRESENT,BOTH PMN AND MONONUCLEAR ABUNDANT GRAM NEGATIVE RODS FEW GRAM POSITIVE COCCI IN PAIRS IN CHAINS Performed at [facility], [place] [address] [place], [place] [address]    Culture PENDING    Report Status PENDING   Glucose, capillary     Status: Abnormal   Collection Time: 12/02/21  1:03 PM  Result Value Ref Range   Glucose-Capillary 123 (H) 70 - 99 mg/dL    Comment: Glucose reference range applies only to samples taken after fasting for at least 8 hours.  Glucose, capillary     Status: Abnormal   Collection Time: 12/02/21  4:07 PM  Result Value Ref Range   Glucose-Capillary 234 (H) 70 - 99 mg/dL    Comment: Glucose reference range applies only to samples taken after fasting for at least 8 hours.  Glucose, capillary     Status: Abnormal   Collection Time: 12/02/21  9:39 PM  Result Value Ref Range   Glucose-Capillary 343 (H) 70 - 99 mg/dL    Comment: Glucose reference range applies only to samples taken after fasting for at least 8 hours.  CBC      Status: Abnormal   Collection Time: 12/03/21  5:20 AM  Result Value Ref Range   WBC 15.6 (H) 4.0 - 10.5 K/uL   RBC 2.83 (L) 4.22 - 5.81 MIL/uL   Hemoglobin 8.0 (L) 13.0 - 17.0 g/dL   HCT 24.3 (L) 39.0 - 52.0 %   MCV 85.9 80.0 - 100.0 fL   MCH 28.3 26.0 - 34.0 pg   MCHC 32.9 30.0 - 36.0 g/dL   RDW 12.8 11.5 - 15.5 %   Platelets 386 150 - 400 K/uL   nRBC 0.0 0.0 - 0.2 %    Comment: Performed at [facility], [address] [place], [place] [address]  Comprehensive metabolic panel     Status: Abnormal   Collection Time: 12/03/21  5:20 AM  Result Value Ref Range   Sodium 135 135 - 145 mmol/L   Potassium 4.0 3.5 - 5.1 mmol/L   Chloride 96 (L) 98 - 111 mmol/L   CO2 29 22 - 32 mmol/L   Glucose, Bld 236 (H) 70 - 99 mg/dL    Comment: Glucose reference range applies only to samples taken after fasting for at least 8 hours.   BUN 19 8 - 23 mg/dL   Creatinine, Ser 1.28 (H) 0.61 - 1.24 mg/dL   Calcium 8.2 (L) 8.9 - 10.3 mg/dL   Total Protein 5.8 (L) 6.5 - 8.1 g/dL   Albumin 2.1 (L) 3.5 - 5.0  g/dL   AST 21 15 - 41 U/L   ALT 19 0 - 44 U/L   Alkaline Phosphatase 77 38 - 126 U/L   Total Bilirubin 0.4 0.3 - 1.2 mg/dL   GFR, Estimated >60 >60 mL/min    Comment: (NOTE) Calculated using the CKD-EPI Creatinine Equation (2021)    Anion gap 10 5 - 15    Comment: Performed at [facility], [address] [place], [place] [address]  Magnesium     Status: None   Collection Time: 12/03/21  5:20 AM  Result Value Ref Range   Magnesium 2.0 1.7 - 2.4 mg/dL    Comment: Performed at [facility], [address] [place], [place] [address]  Glucose, capillary     Status: Abnormal   Collection Time: 12/03/21  7:25 AM  Result Value Ref Range   Glucose-Capillary 217 (H) 70 - 99 mg/dL    Comment: Glucose reference range applies only to samples taken after fasting for at least 8 hours.    US ARTERIAL ABI (SCREENING LOWER EXTREMITY)  Result Date: 12/01/2021 CLINICAL DATA:  Osteomyelitis EXAM: NONINVASIVE  PHYSIOLOGIC VASCULAR STUDY OF BILATERAL LOWER EXTREMITIES TECHNIQUE: Evaluation of both lower extremities were performed at rest, including calculation of ankle-brachial indices with single level Doppler, pressure and pulse volume recording. COMPARISON:  None Available. FINDINGS: Right ABI:  1.37 Left ABI:  1.26 Right Lower Extremity:  Normal arterial waveforms at the ankle. Left Lower Extremity:  Normal arterial waveforms at the ankle. 1.0-1.4 Normal IMPRESSION: Normal bilateral lower extremity ABIs and arterial waveforms at the ankles. Electronically Signed   By: Albin Felling M.D.   On: 12/01/2021 13:11    ROS:  Constitutional: negative Respiratory: negative Cardiovascular: negative Gastrointestinal: negative Genitourinary:negative  Blood pressure 132/65, pulse 80, temperature (!) 96.9 F (36.1 C), resp. rate 19, height _0  (1.956 m), weight 95.4 kg, SpO2 97 %. Physical Exam:  .phys Physical Exam Vitals and nursing note reviewed.  Constitutional:      Appearance: He is not toxic-appearing.  Pulmonary:     Effort: Pulmonary effort is normal.  Feet:     Comments: Skin around dressing appears normal. Skin:    General: Skin is warm and dry.  Neurological:     Mental Status: He is alert and oriented to person, place, and time.  Psychiatric:        Behavior: Behavior normal.      Assessment/Plan: S/p Amputation of left great toe Patient is doing well overall with no signs of infection. Plan to continue monitoring and will change dressing tomorrow.  -Continue IV Rocephin  -Wound cultures still pending  Spero Curb 12/03/2021, 10:08 AM

## 2021-12-04 DIAGNOSIS — N179 Acute kidney failure, unspecified: Secondary | ICD-10-CM | POA: Diagnosis not present

## 2021-12-04 DIAGNOSIS — A419 Sepsis, unspecified organism: Secondary | ICD-10-CM | POA: Diagnosis not present

## 2021-12-04 DIAGNOSIS — R6520 Severe sepsis without septic shock: Secondary | ICD-10-CM | POA: Diagnosis not present

## 2021-12-04 LAB — CBC
HCT: 27.3 % — ABNORMAL LOW (ref 39.0–52.0)
Hemoglobin: 8.8 g/dL — ABNORMAL LOW (ref 13.0–17.0)
MCH: 27.5 pg (ref 26.0–34.0)
MCHC: 32.2 g/dL (ref 30.0–36.0)
MCV: 85.3 fL (ref 80.0–100.0)
Platelets: 428 10*3/uL — ABNORMAL HIGH (ref 150–400)
RBC: 3.2 MIL/uL — ABNORMAL LOW (ref 4.22–5.81)
RDW: 13.1 % (ref 11.5–15.5)
WBC: 11.3 10*3/uL — ABNORMAL HIGH (ref 4.0–10.5)
nRBC: 0.2 % (ref 0.0–0.2)

## 2021-12-04 LAB — CULTURE, BLOOD (ROUTINE X 2): Culture: NO GROWTH

## 2021-12-04 LAB — GLUCOSE, CAPILLARY: Glucose-Capillary: 193 mg/dL — ABNORMAL HIGH (ref 70–99)

## 2021-12-04 MED ORDER — POLYETHYLENE GLYCOL 3350 17 G PO PACK
17.0000 g | PACK | Freq: Every day | ORAL | Status: DC
  Administered 2021-12-04 – 2021-12-08 (×5): 17 g via ORAL
  Filled 2021-12-04 (×6): qty 1

## 2021-12-04 NOTE — Progress Notes (Signed)
2 Days Post-Op  Subjective: Patient has no complaints.  Objective: Vital signs in last 24 hours: Temp:  [97.3 F (36.3 C)-98.1 F (36.7 C)] 97.3 F (36.3 C) (10/21 0353) Pulse Rate:  [88-93] 88 (10/21 0353) Resp:  [18] 18 (10/21 0353) BP: (143-159)/(74-76) 143/76 (10/21 0353) SpO2:  [95 %-100 %] 95 % (10/21 0353) Last BM Date : 11/28/21  Intake/Output from previous day: 10/20 0701 - 10/21 0700 In: 1058.8 [P.O.:960; IV Piggyback:98.8] Out: 4450 [Urine:4450] Intake/Output this shift: No intake/output data recorded.  General appearance: alert, cooperative, and no distress Extremities: Left great toe amputation site healing well with minimal ischemic tissue.  No purulent drainage noted.  Lab Results:  Recent Labs    12/03/21 0520 12/04/21 0537  WBC 15.6* 11.3*  HGB 8.0* 8.8*  HCT 24.3* 27.3*  PLT 386 428*   BMET Recent Labs    12/02/21 0518 12/03/21 0520  NA 131* 135  K 3.8 4.0  CL 97* 96*  CO2 28 29  GLUCOSE 141* 236*  BUN 15 19  CREATININE 1.35* 1.28*  CALCIUM 8.0* 8.2*   PT/INR No results for input(s): "LABPROT", "INR" in the last 72 hours.  Studies/Results: No results found.  Anti-infectives: Anti-infectives (From admission, onward)    Start     Dose/Rate Route Frequency Ordered Stop   12/01/21 2000  cefTRIAXone (ROCEPHIN) 2 g in sodium chloride 0.9 % 100 mL IVPB        2 g 200 mL/hr over 30 Minutes Intravenous Every 24 hours 12/01/21 1013     11/30/21 2200  cefTRIAXone (ROCEPHIN) 2 g in sodium chloride 0.9 % 100 mL IVPB  Status:  Discontinued        2 g 200 mL/hr over 30 Minutes Intravenous Every 24 hours 11/29/21 2349 11/30/21 0751   11/30/21 2100  vancomycin (VANCOREADY) IVPB 1500 mg/300 mL  Status:  Discontinued        1,500 mg 150 mL/hr over 120 Minutes Intravenous Every 24 hours 11/29/21 2022 12/01/21 1004   11/30/21 1000  ceFEPIme (MAXIPIME) 2 g in sodium chloride 0.9 % 100 mL IVPB  Status:  Discontinued        2 g 200 mL/hr over 30  Minutes Intravenous Every 12 hours 11/29/21 2022 12/01/21 1004   11/30/21 0800  metroNIDAZOLE (FLAGYL) IVPB 500 mg  Status:  Discontinued        500 mg 100 mL/hr over 60 Minutes Intravenous Every 12 hours 11/30/21 0752 12/01/21 1004   11/29/21 1945  ceFEPIme (MAXIPIME) 2 g in sodium chloride 0.9 % 100 mL IVPB        2 g 200 mL/hr over 30 Minutes Intravenous  Once 11/29/21 1932 11/29/21 2116   11/29/21 1945  vancomycin (VANCOREADY) IVPB 2000 mg/400 mL        2,000 mg 200 mL/hr over 120 Minutes Intravenous  Once 11/29/21 1932 11/30/21 0538   11/29/21 1930  aztreonam (AZACTAM) 2 g in sodium chloride 0.9 % 100 mL IVPB  Status:  Discontinued        2 g 200 mL/hr over 30 Minutes Intravenous  Once 11/29/21 1926 11/29/21 1931   11/29/21 1930  metroNIDAZOLE (FLAGYL) IVPB 500 mg        500 mg 100 mL/hr over 60 Minutes Intravenous  Once 11/29/21 1926 11/29/21 2131   11/29/21 1930  vancomycin (VANCOCIN) IVPB 1000 mg/200 mL premix  Status:  Discontinued        1,000 mg 200 mL/hr over 60 Minutes Intravenous  Once  11/29/21 1926 11/29/21 1930       Assessment/Plan: s/p Procedure(s): AMPUTATION TOE Impression: Stable on postoperative day 2.  Final ID on wound culture pending.  White blood cell count down to 11.3. Plan: Continue IV antibiotics.  May close the wound during this admission.  Discussed with Dr. Manuella Ghazi.  LOS: 5 days    Aviva Signs 12/04/2021

## 2021-12-04 NOTE — Progress Notes (Signed)
PROGRESS NOTE    [name]  KDX:[id number] DOB: 1950-09-07 DOA: 11/29/2021 PCP: Sharilyn Sites, MD   Brief Narrative:  [name] is a 71 y.o. male with medical history significant of cognitive impairment, anxiety, diabetes mellitus type 2, GERD, hyperlipidemia, hypertension, osteomyelitis with right great toe amputation, renal insufficiency, and more presents the ED with a chief complaint of feeling hot and vomiting.  Patient reports he laid down around 5 PM.  He woke up from his nap and he was diaphoretic, flushed, and nauseous.  He reports 1 episode of vomiting.  It was nonbloody.  Patient reports he checked his sugar and it was 300.  He reports that his sugar is usually between 100 and 200.  Patient reports he takes 20 units of insulin at home.  He reports that he has had polyuria, and polydipsia today.  He denies any polyphasia.  He reports he has had a fever at home.  Tylenol has helped it.  He has had a headache and is felt dizzy.  He reports the headache is feeling "swimmy headed."  Patient reports that he had swelling in his left great toe for 2 or 3 months.  He reports that Sunday he had purulent drainage.  He had no trauma to the toe.  He does not have any pain in that toe.  Patient has no other complaints at this time.   Patient does not smoke, does not drink, does not use illicit drugs.  Patient is vaccinated for COVID.  Patient is full code.  ED Temp 98.8-101.1, heart rate 101-20 35, respiratory rate 16-27, blood pressure 116/56-148/69, satting 95-97% Leukocytosis 18.3, hemoglobin stable at 9.0, platelets 346 Chemistry reveals a creatinine that is at his baseline 1.77, hyperglycemia at 252 Negative COVID Albumin is low at 2.6 Blood cultures pending Chest x-ray negative Left foot x-ray shows possible osteomyelitis Pain, bank, Flagyl started EKG shows sinus tach with a heart rate of 134, QTc 493 Admission requested for SIRS secondary to osteomyelitis    Assessment &  Plan:   Principal Problem:   Sepsis ([place]) Active Problems:   Bedbug bite with infection   Hypertension   Anxiety   DM (diabetes mellitus) ([place])   Osteomyelitis ([place])   Amputated great toe of right foot ([place])   Stage 3a chronic kidney disease (HCC)   AKI (acute kidney injury) ([place])   Hyperlipidemia   Osteomyelitis of great toe of left foot ([place])  Assessment and Plan:   Sepsis, POA, secondary to Osteomyelitis of Left Great Toe Leukocytosis downward trending.  Gram stain showed abundant WBC, abundant gram negative rods, and few gram positive cocci in pairs and chains. Wound culture final results are pending. Initial blood cultures on admission were positive for Strep. agalactiae but repeat blood cultures show no growth after 2 days. Patient is being treated with IV Rocephin 2 g (Day 4) with total antibiotic duration to be determined after wound culture results. Patient is no longer NPO so IV fluids discontinued and PT/OT consulted to evaluate needs. -Continue to monitor vitals and CBC -Continue Rocephin   Pharyngitis Patient has a history of Hay Fever. Patient has no other symptoms and remains afebrile. Flonase ordered yesterday and patient states symptoms have improved.   DM (diabetes mellitus) ([place]) A1C from 9/23 is 6.8. CBG currently at 217. Yesterday, Levemir increased to 20 u QHS and Novolog increased to 7 u TID with meals.  -Continue Levemir 20 u QHS -Continue Novolog TID with SSI   Hypoalbuminemia Albumin  is 2.1 (2.0 yesterday). Patient reports good appetite and has been having Glucerna shakes TID. -Continue to monitor   Hyperlipidemia - Continue Lipitor - Heart healthy diet   AKI on CKD Stage 3a Resolved   Anxiety -Continue Zoloft and Bupropion   Hypertension -Currently stable   Bedbug bite with infection Resolved    DVT prophylaxis: Heparin Code Status: Full Family Communication: None at bedside Disposition Plan:  Status is: Inpatient Remains inpatient  appropriate because: Need for ongoing IV medications.   Consultants:  General surgery  Procedures:  Left great toe amputation 10/19  Antimicrobials:  Anti-infectives (From admission, onward)    Start     Dose/Rate Route Frequency Ordered Stop   12/01/21 2000  cefTRIAXone (ROCEPHIN) 2 g in sodium chloride 0.9 % 100 mL IVPB        2 g 200 mL/hr over 30 Minutes Intravenous Every 24 hours 12/01/21 1013     11/30/21 2200  cefTRIAXone (ROCEPHIN) 2 g in sodium chloride 0.9 % 100 mL IVPB  Status:  Discontinued        2 g 200 mL/hr over 30 Minutes Intravenous Every 24 hours 11/29/21 2349 11/30/21 0751   11/30/21 2100  vancomycin (VANCOREADY) IVPB 1500 mg/300 mL  Status:  Discontinued        1,500 mg 150 mL/hr over 120 Minutes Intravenous Every 24 hours 11/29/21 2022 12/01/21 1004   11/30/21 1000  ceFEPIme (MAXIPIME) 2 g in sodium chloride 0.9 % 100 mL IVPB  Status:  Discontinued        2 g 200 mL/hr over 30 Minutes Intravenous Every 12 hours 11/29/21 2022 12/01/21 1004   11/30/21 0800  metroNIDAZOLE (FLAGYL) IVPB 500 mg  Status:  Discontinued        500 mg 100 mL/hr over 60 Minutes Intravenous Every 12 hours 11/30/21 0752 12/01/21 1004   11/29/21 1945  ceFEPIme (MAXIPIME) 2 g in sodium chloride 0.9 % 100 mL IVPB        2 g 200 mL/hr over 30 Minutes Intravenous  Once 11/29/21 1932 11/29/21 2116   11/29/21 1945  vancomycin (VANCOREADY) IVPB 2000 mg/400 mL        2,000 mg 200 mL/hr over 120 Minutes Intravenous  Once 11/29/21 1932 11/30/21 0538   11/29/21 1930  aztreonam (AZACTAM) 2 g in sodium chloride 0.9 % 100 mL IVPB  Status:  Discontinued        2 g 200 mL/hr over 30 Minutes Intravenous  Once 11/29/21 1926 11/29/21 1931   11/29/21 1930  metroNIDAZOLE (FLAGYL) IVPB 500 mg        500 mg 100 mL/hr over 60 Minutes Intravenous  Once 11/29/21 1926 11/29/21 2131   11/29/21 1930  vancomycin (VANCOCIN) IVPB 1000 mg/200 mL premix  Status:  Discontinued        1,000 mg 200 mL/hr over 60  Minutes Intravenous  Once 11/29/21 1926 11/29/21 1930       Subjective: Patient seen and evaluated today with no new acute complaints or concerns. No acute concerns or events noted overnight.  Objective: Vitals:   12/03/21 0609 12/03/21 1341 12/03/21 2019 12/04/21 0353  BP: 132/65 (!) 159/76 (!) 146/74 (!) 143/76  Pulse: 80 91 93 88  Resp: '19 18 18 18  '$ Temp: (!) 96.9 F (36.1 C) 98.1 F (36.7 C) (!) 97.5 F (36.4 C) (!) 97.3 F (36.3 C)  TempSrc:  Oral    SpO2: 97% 100% 99% 95%  Weight:      Height:  Intake/Output Summary (Last 24 hours) at 12/04/2021 1106 Last data filed at 12/04/2021 0700 Gross per 24 hour  Intake 938.82 ml  Output 4450 ml  Net -3511.18 ml   Filed Weights   11/29/21 1907 11/30/21 0908  Weight: 95.7 kg 95.4 kg    Examination:  General exam: Appears calm and comfortable  Respiratory system: Clear to auscultation. Respiratory effort normal. Cardiovascular system: S1 & S2 heard, RRR.  Gastrointestinal system: Abdomen is soft Central nervous system: Alert and awake Extremities: No edema, left foot dressings clean dry and intact Skin: No significant lesions noted Psychiatry: Flat affect.    Data Reviewed: I have personally reviewed following labs and imaging studies  CBC: Recent Labs  Lab 11/29/21 1910 11/30/21 0438 12/01/21 0440 12/02/21 0518 12/03/21 0520 12/04/21 0537  WBC 18.3* 21.5* 18.1* 17.2* 15.6* 11.3*  NEUTROABS 17.1*  --   --   --   --   --   HGB 9.0* 8.7* 8.2* 7.6* 8.0* 8.8*  HCT 26.5* 26.6* 25.1* 22.9* 24.3* 27.3*  MCV 84.7 86.1 86.0 85.1 85.9 85.3  PLT 346 345 356 341 386 607*   Basic Metabolic Panel: Recent Labs  Lab 11/29/21 1910 11/30/21 0438 12/01/21 0440 12/02/21 0518 12/03/21 0520  NA 130* 133* 130* 131* 135  K 3.9 3.8 3.8 3.8 4.0  CL 98 100 100 97* 96*  CO2 21* '22 24 28 29  '$ GLUCOSE 252* 232* 194* 141* 236*  BUN 31* 28* '21 15 19  '$ CREATININE 1.77* 1.51* 1.37* 1.35* 1.28*  CALCIUM 8.4* 8.5* 7.8*  8.0* 8.2*  MG  --  1.9  --   --  2.0   GFR: Estimated Creatinine Clearance: 67.7 mL/min (A) (by C-G formula based on SCr of 1.28 mg/dL (H)). Liver Function Tests: Recent Labs  Lab 11/29/21 1910 11/30/21 0438 12/01/21 0440 12/02/21 0518 12/03/21 0520  AST 18 13* 15 13* 21  ALT '20 18 17 15 19  '$ ALKPHOS 78 74 78 73 77  BILITOT 1.3* 1.0 0.8 0.7 0.4  PROT 6.9 6.5 6.0* 5.6* 5.8*  ALBUMIN 2.6* 2.5* 2.2* 2.0* 2.1*   No results for input(s): "LIPASE", "AMYLASE" in the last 168 hours. No results for input(s): "AMMONIA" in the last 168 hours. Coagulation Profile: Recent Labs  Lab 11/29/21 1910  INR 1.3*   Cardiac Enzymes: No results for input(s): "CKTOTAL", "CKMB", "CKMBINDEX", "TROPONINI" in the last 168 hours. BNP (last 3 results) No results for input(s): "PROBNP" in the last 8760 hours. HbA1C: No results for input(s): "HGBA1C" in the last 72 hours. CBG: Recent Labs  Lab 12/02/21 2139 12/03/21 0725 12/03/21 1140 12/03/21 1645 12/03/21 2021  GLUCAP 343* 217* 206* 163* 190*   Lipid Profile: No results for input(s): "CHOL", "HDL", "LDLCALC", "TRIG", "CHOLHDL", "LDLDIRECT" in the last 72 hours. Thyroid Function Tests: No results for input(s): "TSH", "T4TOTAL", "FREET4", "T3FREE", "THYROIDAB" in the last 72 hours. Anemia Panel: No results for input(s): "VITAMINB12", "FOLATE", "FERRITIN", "TIBC", "IRON", "RETICCTPCT" in the last 72 hours. Sepsis Labs: Recent Labs  Lab 11/29/21 1910 11/29/21 2130 11/30/21 0821  LATICACIDVEN 1.8 1.2 1.1    Recent Results (from the past 240 hour(s))  Resp Panel by RT-PCR (Flu A&B, Covid) Anterior Nasal Swab     Status: None   Collection Time: 11/29/21  7:18 PM   Specimen: Anterior Nasal Swab  Result Value Ref Range Status   SARS Coronavirus 2 by RT PCR NEGATIVE NEGATIVE Final    Comment: (NOTE) SARS-CoV-2 target nucleic acids are NOT DETECTED.  The  SARS-CoV-2 RNA is generally detectable in upper respiratory specimens during the acute  phase of infection. The lowest concentration of SARS-CoV-2 viral copies this assay can detect is 138 copies/mL. A negative result does not preclude SARS-Cov-2 infection and should not be used as the sole basis for treatment or other patient management decisions. A negative result may occur with  improper specimen collection/handling, submission of specimen other than nasopharyngeal swab, presence of viral mutation(s) within the areas targeted by this assay, and inadequate number of viral copies(<138 copies/mL). A negative result must be combined with clinical observations, patient history, and epidemiological information. The expected result is Negative.  Fact Sheet for Patients:  EntrepreneurPulse.com.au  Fact Sheet for Healthcare Providers:  IncredibleEmployment.be  This test is no t yet approved or cleared by the [place] FDA and  has been authorized for detection and/or diagnosis of SARS-CoV-2 by FDA under an Emergency Use Authorization (EUA). This EUA will remain  in effect (meaning this test can be used) for the duration of the COVID-19 declaration under Section 564(b)(1) of the Act, 21 U.S.C.section 360bbb-3(b)(1), unless the authorization is terminated  or revoked sooner.       Influenza A by PCR NEGATIVE NEGATIVE Final   Influenza B by PCR NEGATIVE NEGATIVE Final    Comment: (NOTE) The Xpert Xpress SARS-CoV-2/FLU/RSV plus assay is intended as an aid in the diagnosis of influenza from Nasopharyngeal swab specimens and should not be used as a sole basis for treatment. Nasal washings and aspirates are unacceptable for Xpert Xpress SARS-CoV-2/FLU/RSV testing.  Fact Sheet for Patients: EntrepreneurPulse.com.au  Fact Sheet for Healthcare Providers: IncredibleEmployment.be  This test is not yet approved or cleared by the [place] FDA and has been authorized for detection and/or diagnosis of  SARS-CoV-2 by FDA under an Emergency Use Authorization (EUA). This EUA will remain in effect (meaning this test can be used) for the duration of the COVID-19 declaration under Section 564(b)(1) of the Act, 21 U.S.C. section 360bbb-3(b)(1), unless the authorization is terminated or revoked.  Performed at [facility], [address] [place], [place] [address]   Culture, blood (Routine x 2)     Status: None   Collection Time: 11/29/21  7:25 PM   Specimen: BLOOD RIGHT FOREARM  Result Value Ref Range Status   Specimen Description BLOOD RIGHT FOREARM  Final   Special Requests   Final    BOTTLES DRAWN AEROBIC AND ANAEROBIC Blood Culture results may not be optimal due to an excessive volume of blood received in culture bottles   Culture   Final    NO GROWTH 5 DAYS Performed at [facility], [address], [place], [place] [address]    Report Status 12/04/2021 FINAL  Final  Culture, blood (Routine x 2)     Status: Abnormal   Collection Time: 11/29/21  7:30 PM   Specimen: BLOOD RIGHT HAND  Result Value Ref Range Status   Specimen Description   Final    BLOOD RIGHT HAND Performed at [facility], [address], [place], [place] [address]    Special Requests   Final    BOTTLES DRAWN AEROBIC AND ANAEROBIC Blood Culture results may not be optimal due to an excessive volume of blood received in culture bottles Performed at [facility], [address], [place], [place] [address]    Culture  Setup Time   Final    AEROBIC BOTTLE ONLY GRAM POSITIVE COCCI Gram Stain Report Called to,Read Back By and Verified With: LEIGHANN PARSONS @ [phone]  ON 11/30/21 C VARNER CRITICAL RESULT CALLED TO, READ BACK BY AND VERIFIED WITH: PHARMD STEVEN HURTH ON 11/30/21 @ 1658 BY DRT Performed at [facility], [place] [address] [place], [place] [address]    Culture (A)  Final    STREPTOCOCCUS AGALACTIAE STREPTOCOCCUS MITIS/ORALIS    Report Status 12/03/2021 FINAL  Final   Organism ID, Bacteria STREPTOCOCCUS  AGALACTIAE  Final   Organism ID, Bacteria STREPTOCOCCUS MITIS/ORALIS  Final      Susceptibility   Streptococcus agalactiae - MIC*    CLINDAMYCIN <=0.25 SENSITIVE Sensitive     AMPICILLIN <=0.25 SENSITIVE Sensitive     ERYTHROMYCIN 2 RESISTANT Resistant     VANCOMYCIN 0.5 SENSITIVE Sensitive     CEFTRIAXONE <=0.12 SENSITIVE Sensitive     LEVOFLOXACIN 0.5 SENSITIVE Sensitive     PENICILLIN <=0.06 SENSITIVE Sensitive     * STREPTOCOCCUS AGALACTIAE   Streptococcus mitis/oralis - MIC*    TETRACYCLINE >=16 RESISTANT Resistant     VANCOMYCIN 0.5 SENSITIVE Sensitive     CLINDAMYCIN >=1 RESISTANT Resistant     PENICILLIN <=0.06      CEFTRIAXONE <=0.12      * STREPTOCOCCUS MITIS/ORALIS  Blood Culture ID Panel (Reflexed)     Status: Abnormal   Collection Time: 11/29/21  7:30 PM  Result Value Ref Range Status   Enterococcus faecalis NOT DETECTED NOT DETECTED Final   Enterococcus Faecium NOT DETECTED NOT DETECTED Final   Listeria monocytogenes NOT DETECTED NOT DETECTED Final   Staphylococcus species NOT DETECTED NOT DETECTED Final   Staphylococcus aureus (BCID) NOT DETECTED NOT DETECTED Final   Staphylococcus epidermidis NOT DETECTED NOT DETECTED Final   Staphylococcus lugdunensis NOT DETECTED NOT DETECTED Final   Streptococcus species DETECTED (A) NOT DETECTED Final    Comment: CRITICAL RESULT CALLED TO, READ BACK BY AND VERIFIED WITH: PHARMD STEVEN HURTH ON 11/30/21 @ 1658 BY DRT    Streptococcus agalactiae DETECTED (A) NOT DETECTED Final    Comment: CRITICAL RESULT CALLED TO, READ BACK BY AND VERIFIED WITH: PHARMD STEVEN HURTH ON 11/30/21 @ 2353 BY DRT    Streptococcus pneumoniae NOT DETECTED NOT DETECTED Final   Streptococcus pyogenes NOT DETECTED NOT DETECTED Final   A.calcoaceticus-baumannii NOT DETECTED NOT DETECTED Final   Bacteroides fragilis NOT DETECTED NOT DETECTED Final   Enterobacterales NOT DETECTED NOT DETECTED Final   Enterobacter cloacae complex NOT DETECTED NOT  DETECTED Final   Escherichia coli NOT DETECTED NOT DETECTED Final   Klebsiella aerogenes NOT DETECTED NOT DETECTED Final   Klebsiella oxytoca NOT DETECTED NOT DETECTED Final   Klebsiella pneumoniae NOT DETECTED NOT DETECTED Final   Proteus species NOT DETECTED NOT DETECTED Final   Salmonella species NOT DETECTED NOT DETECTED Final   Serratia marcescens NOT DETECTED NOT DETECTED Final   Haemophilus influenzae NOT DETECTED NOT DETECTED Final   Neisseria meningitidis NOT DETECTED NOT DETECTED Final   Pseudomonas aeruginosa NOT DETECTED NOT DETECTED Final   Stenotrophomonas maltophilia NOT DETECTED NOT DETECTED Final   Candida albicans NOT DETECTED NOT DETECTED Final   Candida auris NOT DETECTED NOT DETECTED Final   Candida glabrata NOT DETECTED NOT DETECTED Final   Candida krusei NOT DETECTED NOT DETECTED Final   Candida parapsilosis NOT DETECTED NOT DETECTED Final   Candida tropicalis NOT DETECTED NOT DETECTED Final   Cryptococcus neoformans/gattii NOT DETECTED NOT DETECTED Final    Comment: Performed at [facility], [place]. [address], [place], [place] [address]  Culture, blood (Routine X 2) w Reflex to  ID Panel     Status: None (Preliminary result)   Collection Time: 12/01/21  7:45 AM   Specimen: Right Antecubital; Blood  Result Value Ref Range Status   Specimen Description RIGHT ANTECUBITAL  Final   Special Requests   Final    BOTTLES DRAWN AEROBIC AND ANAEROBIC Blood Culture results may not be optimal due to an excessive volume of blood received in culture bottles   Culture   Final    NO GROWTH 3 DAYS Performed at [facility], [address], [place], [place] [address]    Report Status PENDING  Incomplete  Culture, blood (Routine X 2) w Reflex to ID Panel     Status: None (Preliminary result)   Collection Time: 12/01/21  7:51 AM   Specimen: BLOOD LEFT HAND  Result Value Ref Range Status   Specimen Description BLOOD LEFT HAND  Final   Special Requests   Final    BOTTLES  DRAWN AEROBIC AND ANAEROBIC Blood Culture results may not be optimal due to an excessive volume of blood received in culture bottles   Culture   Final    NO GROWTH 3 DAYS Performed at [facility], [address] [place], [place] [address]    Report Status PENDING  Incomplete  MRSA Next Gen by PCR, Nasal     Status: None   Collection Time: 12/02/21 10:30 AM   Specimen: Nasal Mucosa; Nasal Swab  Result Value Ref Range Status   MRSA by PCR Next Gen NOT DETECTED NOT DETECTED Final    Comment: (NOTE) The GeneXpert MRSA Assay (FDA approved for NASAL specimens only), is one component of a comprehensive MRSA colonization surveillance program. It is not intended to diagnose MRSA infection nor to guide or monitor treatment for MRSA infections. Test performance is not FDA approved in patients less than 68 years old. Performed at [facility], [address], [place], [place] [address]   Aerobic/Anaerobic Culture w Gram Stain (surgical/deep wound)     Status: None (Preliminary result)   Collection Time: 12/02/21 12:31 PM   Specimen: Wound  Result Value Ref Range Status   Specimen Description   Final    WOUND Performed at [facility], [address], [place], [place] [address]    Special Requests   Final    NONE Performed at [facility], [address], [place], [place] [address]    Gram Stain   Final    ABUNDANT WBC PRESENT,BOTH PMN AND MONONUCLEAR ABUNDANT GRAM NEGATIVE RODS FEW GRAM POSITIVE COCCI IN PAIRS IN CHAINS    Culture   Final    CULTURE REINCUBATED FOR BETTER GROWTH Performed at [facility], [place] [address], [place], [place] [address]    Report Status PENDING  Incomplete         Radiology Studies: No results found.      Scheduled Meds:  atorvastatin  10 mg Oral QHS   buPROPion  150 mg Oral QHS   feeding supplement (GLUCERNA SHAKE)  237 mL Oral TID BM   ferrous sulfate  325 mg Oral Q breakfast   fluticasone  1 spray Each Nare Daily   heparin  5,000  Units Subcutaneous Q8H   insulin aspart  0-15 Units Subcutaneous TID WC   insulin aspart  0-5 Units Subcutaneous QHS   insulin aspart  7 Units Subcutaneous TID WC   insulin detemir  20 Units Subcutaneous QHS   lactobacillus  1 g Oral TID WC   pantoprazole  40 mg  Oral Daily   permethrin   Topical Once   sertraline  100 mg Oral QHS   sodium bicarbonate  650 mg Oral BID   Continuous Infusions:  cefTRIAXone (ROCEPHIN)  IV Stopped (12/03/21 2045)     LOS: 5 days    Time spent: 35 minutes    Manuel Lawhead Darleen Crocker, DO [facility]  If 7PM-7AM, please contact night-coverage [email or web address] 12/04/2021, 11:06 AM

## 2021-12-04 NOTE — Progress Notes (Signed)
Pt has been pleasant with no c/o pain pain or discomfort this shift. Will continue to monitor pt.

## 2021-12-04 NOTE — Progress Notes (Signed)
Pt had uneventful night. Denies pain all night. Marvin Corona Edd Fabian

## 2021-12-05 DIAGNOSIS — N179 Acute kidney failure, unspecified: Secondary | ICD-10-CM | POA: Diagnosis not present

## 2021-12-05 DIAGNOSIS — R6520 Severe sepsis without septic shock: Secondary | ICD-10-CM | POA: Diagnosis not present

## 2021-12-05 DIAGNOSIS — A419 Sepsis, unspecified organism: Secondary | ICD-10-CM | POA: Diagnosis not present

## 2021-12-05 LAB — CBC
HCT: 29.3 % — ABNORMAL LOW (ref 39.0–52.0)
Hemoglobin: 9.6 g/dL — ABNORMAL LOW (ref 13.0–17.0)
MCH: 27.8 pg (ref 26.0–34.0)
MCHC: 32.8 g/dL (ref 30.0–36.0)
MCV: 84.9 fL (ref 80.0–100.0)
Platelets: 440 10*3/uL — ABNORMAL HIGH (ref 150–400)
RBC: 3.45 MIL/uL — ABNORMAL LOW (ref 4.22–5.81)
RDW: 13.2 % (ref 11.5–15.5)
WBC: 9.8 10*3/uL (ref 4.0–10.5)
nRBC: 0 % (ref 0.0–0.2)

## 2021-12-05 LAB — AEROBIC/ANAEROBIC CULTURE W GRAM STAIN (SURGICAL/DEEP WOUND)

## 2021-12-05 LAB — GLUCOSE, CAPILLARY
Glucose-Capillary: 175 mg/dL — ABNORMAL HIGH (ref 70–99)
Glucose-Capillary: 289 mg/dL — ABNORMAL HIGH (ref 70–99)
Glucose-Capillary: 189 mg/dL — ABNORMAL HIGH (ref 70–99)
Glucose-Capillary: 185 mg/dL — ABNORMAL HIGH (ref 70–99)

## 2021-12-05 MED ORDER — SODIUM CHLORIDE 0.9 % IV SOLN: 3.0000 g | Freq: Four times a day (QID) | INTRAVENOUS | Status: DC

## 2021-12-05 MED ORDER — PIPERACILLIN-TAZOBACTAM 3.375 G IVPB
3.3750 g | Freq: Three times a day (TID) | INTRAVENOUS | Status: DC
  Administered 2021-12-05 – 2021-12-07 (×6): 3.375 g via INTRAVENOUS
  Filled 2021-12-05 (×6): qty 50

## 2021-12-05 MED ORDER — AMOXICILLIN 250 MG PO CAPS
500.0000 mg | ORAL_CAPSULE | Freq: Once | ORAL | Status: AC
  Administered 2021-12-05: 500 mg via ORAL
  Filled 2021-12-05: qty 2

## 2021-12-05 MED ORDER — EPINEPHRINE 0.3 MG/0.3ML IJ SOAJ
0.3000 mg | INTRAMUSCULAR | Status: DC | PRN
  Filled 2021-12-05: qty 0.3

## 2021-12-05 MED ORDER — METRONIDAZOLE 500 MG PO TABS
500.0000 mg | ORAL_TABLET | Freq: Two times a day (BID) | ORAL | Status: DC
  Administered 2021-12-05: 500 mg via ORAL
  Filled 2021-12-05: qty 1

## 2021-12-05 NOTE — Progress Notes (Signed)
        Date: 12/05/2021  Patient name: [name]  Medical record number: [id number]  Date of birth: December 30, 1950   Patient with polymicrobial bacteremia with GBS, Strep mitis/oralis due to osteomyelitis sp amputation of left toe with MODERATE ENTEROCOCCUS FAECALIS  MODERATE STREPTOCOCCUS MITIS/ORALIS  RARE PROTEUS PENNERI  MODERATE PREVOTELLA BIVIA  BETA LACTAMASE POSITIVE   On culture  I wanted switch him to unasyn (the E faecalis not covered by CTX)   However he has PCN listed though it was a childhoood one and unknown  We should give him a test dose of oral amoxicillin in the hospital and have RN check him every 15 minutes and if he tolerates then switch to unasyn and then tomorrow go to augmentin   Melanie Crazier Brennen has an appointment on 12/20/2021  at [place] with Dr. Tommy Medal  [facility], which  is located in the [facility] at  [place] in [place].  [address], which is located to the left of the elevators.  Phone: [phone]  Fax: [phone]  [email or web address]  The patient should arrive 30 minutes prior to their appoitment.     Rhina Brackett Dam 12/05/2021, 2:13 PM

## 2021-12-05 NOTE — Progress Notes (Addendum)
      INFECTIOUS DISEASE ATTENDING ADDENDUM:   Date: 12/05/2021  Patient name: [name]  Medical record number: [id number]  Date of birth: 1950-09-03   No reaction to amoxicillin will dc PCN allergy and switch to unasyn  If he does well would DC with one month of augmentin   Given he was bacteremic could opt for aggressive dose of 2000/'125mg'$  BID to complete the first 7 days of therapy  Would confer with Jimmy Footman with ID pharmacy in the [place] 12/05/2021, 4:45 PM

## 2021-12-05 NOTE — Progress Notes (Signed)
3 Days Post-Op  Subjective: Patient has no complaints.  Objective: Vital signs in last 24 hours: Temp:  [98.1 F (36.7 C)-98.7 F (37.1 C)] 98.3 F (36.8 C) (10/22 0418) Pulse Rate:  [94] 94 (10/22 0418) Resp:  [18] 18 (10/22 0418) BP: (143-160)/(54-72) 143/72 (10/22 0418) SpO2:  [97 %-100 %] 100 % (10/22 0418) Last BM Date : 11/28/21  Intake/Output from previous day: 10/21 0701 - 10/22 0700 In: 840 [P.O.:840] Out: 3900 [Urine:3900] Intake/Output this shift: Total I/O In: 98.6 [IV Piggyback:98.6] Out: -   General appearance: alert, cooperative, and no distress Extremities: Left foot wound was just dressed by nursing  Lab Results:  Recent Labs    12/04/21 0537 12/05/21 0522  WBC 11.3* 9.8  HGB 8.8* 9.6*  HCT 27.3* 29.3*  PLT 428* 440*   BMET Recent Labs    12/03/21 0520  NA 135  K 4.0  CL 96*  CO2 29  GLUCOSE 236*  BUN 19  CREATININE 1.28*  CALCIUM 8.2*   PT/INR No results for input(s): "LABPROT", "INR" in the last 72 hours.  Studies/Results: No results found.  Anti-infectives: Anti-infectives (From admission, onward)    Start     Dose/Rate Route Frequency Ordered Stop   12/05/21 1115  metroNIDAZOLE (FLAGYL) tablet 500 mg        500 mg Oral Every 12 hours 12/05/21 1025     12/01/21 2000  cefTRIAXone (ROCEPHIN) 2 g in sodium chloride 0.9 % 100 mL IVPB        2 g 200 mL/hr over 30 Minutes Intravenous Every 24 hours 12/01/21 1013     11/30/21 2200  cefTRIAXone (ROCEPHIN) 2 g in sodium chloride 0.9 % 100 mL IVPB  Status:  Discontinued        2 g 200 mL/hr over 30 Minutes Intravenous Every 24 hours 11/29/21 2349 11/30/21 0751   11/30/21 2100  vancomycin (VANCOREADY) IVPB 1500 mg/300 mL  Status:  Discontinued        1,500 mg 150 mL/hr over 120 Minutes Intravenous Every 24 hours 11/29/21 2022 12/01/21 1004   11/30/21 1000  ceFEPIme (MAXIPIME) 2 g in sodium chloride 0.9 % 100 mL IVPB  Status:  Discontinued        2 g 200 mL/hr over 30 Minutes  Intravenous Every 12 hours 11/29/21 2022 12/01/21 1004   11/30/21 0800  metroNIDAZOLE (FLAGYL) IVPB 500 mg  Status:  Discontinued        500 mg 100 mL/hr over 60 Minutes Intravenous Every 12 hours 11/30/21 0752 12/01/21 1004   11/29/21 1945  ceFEPIme (MAXIPIME) 2 g in sodium chloride 0.9 % 100 mL IVPB        2 g 200 mL/hr over 30 Minutes Intravenous  Once 11/29/21 1932 11/29/21 2116   11/29/21 1945  vancomycin (VANCOREADY) IVPB 2000 mg/400 mL        2,000 mg 200 mL/hr over 120 Minutes Intravenous  Once 11/29/21 1932 11/30/21 0538   11/29/21 1930  aztreonam (AZACTAM) 2 g in sodium chloride 0.9 % 100 mL IVPB  Status:  Discontinued        2 g 200 mL/hr over 30 Minutes Intravenous  Once 11/29/21 1926 11/29/21 1931   11/29/21 1930  metroNIDAZOLE (FLAGYL) IVPB 500 mg        500 mg 100 mL/hr over 60 Minutes Intravenous  Once 11/29/21 1926 11/29/21 2131   11/29/21 1930  vancomycin (VANCOCIN) IVPB 1000 mg/200 mL premix  Status:  Discontinued  1,000 mg 200 mL/hr over 60 Minutes Intravenous  Once 11/29/21 1926 11/29/21 1930       Assessment/Plan: s/p Procedure(s): AMPUTATION TOE Impression: Status post left great toe amputation.  Leukocytosis has resolved.  Final cultures reveal Enterococcus faecalis/strep mitis.  Will discuss with Dr. Manuella Ghazi. Plan: We will take patient back to the operating room tomorrow for closure of his wound.  The risks and benefits of the procedure were fully explained to the patient, who gave informed consent.  LOS: 6 days    Aviva Signs 12/05/2021

## 2021-12-05 NOTE — Progress Notes (Signed)
$'500mg'x$  of amoxicillin given for amoxicillin trial per Dr Manuella Ghazi and Dr Rhina [place]. will check vital signs every 15 minutes for one hour.

## 2021-12-05 NOTE — Progress Notes (Signed)
      INFECTIOUS DISEASE ATTENDING ADDENDUM:   Date: 12/05/2021  Patient name: [name]  Medical record number: [id number]  Date of birth: 08/24/1950   I was alerted to a proteus species form operative culture as well now which I wa snot aware of which is R to AMP/SUL  We will switch to zosyn  Could consider augmentin + bactrim aas oral option or go home with zosyn with continuous infusion  I will defer to my partner Dr. Gale Journey who is covering [facility] and [facility] tomorrow re this.   Alcide Evener 12/05/2021, 5:09 PM

## 2021-12-05 NOTE — H&P (View-Only) (Signed)
3 Days Post-Op  Subjective: Patient has no complaints.  Objective: Vital signs in last 24 hours: Temp:  [98.1 F (36.7 C)-98.7 F (37.1 C)] 98.3 F (36.8 C) (10/22 0418) Pulse Rate:  [94] 94 (10/22 0418) Resp:  [18] 18 (10/22 0418) BP: (143-160)/(54-72) 143/72 (10/22 0418) SpO2:  [97 %-100 %] 100 % (10/22 0418) Last BM Date : 11/28/21  Intake/Output from previous day: 10/21 0701 - 10/22 0700 In: 840 [P.O.:840] Out: 3900 [Urine:3900] Intake/Output this shift: Total I/O In: 98.6 [IV Piggyback:98.6] Out: -   General appearance: alert, cooperative, and no distress Extremities: Left foot wound was just dressed by nursing  Lab Results:  Recent Labs    12/04/21 0537 12/05/21 0522  WBC 11.3* 9.8  HGB 8.8* 9.6*  HCT 27.3* 29.3*  PLT 428* 440*   BMET Recent Labs    12/03/21 0520  NA 135  K 4.0  CL 96*  CO2 29  GLUCOSE 236*  BUN 19  CREATININE 1.28*  CALCIUM 8.2*   PT/INR No results for input(s): "LABPROT", "INR" in the last 72 hours.  Studies/Results: No results found.  Anti-infectives: Anti-infectives (From admission, onward)    Start     Dose/Rate Route Frequency Ordered Stop   12/05/21 1115  metroNIDAZOLE (FLAGYL) tablet 500 mg        500 mg Oral Every 12 hours 12/05/21 1025     12/01/21 2000  cefTRIAXone (ROCEPHIN) 2 g in sodium chloride 0.9 % 100 mL IVPB        2 g 200 mL/hr over 30 Minutes Intravenous Every 24 hours 12/01/21 1013     11/30/21 2200  cefTRIAXone (ROCEPHIN) 2 g in sodium chloride 0.9 % 100 mL IVPB  Status:  Discontinued        2 g 200 mL/hr over 30 Minutes Intravenous Every 24 hours 11/29/21 2349 11/30/21 0751   11/30/21 2100  vancomycin (VANCOREADY) IVPB 1500 mg/300 mL  Status:  Discontinued        1,500 mg 150 mL/hr over 120 Minutes Intravenous Every 24 hours 11/29/21 2022 12/01/21 1004   11/30/21 1000  ceFEPIme (MAXIPIME) 2 g in sodium chloride 0.9 % 100 mL IVPB  Status:  Discontinued        2 g 200 mL/hr over 30 Minutes  Intravenous Every 12 hours 11/29/21 2022 12/01/21 1004   11/30/21 0800  metroNIDAZOLE (FLAGYL) IVPB 500 mg  Status:  Discontinued        500 mg 100 mL/hr over 60 Minutes Intravenous Every 12 hours 11/30/21 0752 12/01/21 1004   11/29/21 1945  ceFEPIme (MAXIPIME) 2 g in sodium chloride 0.9 % 100 mL IVPB        2 g 200 mL/hr over 30 Minutes Intravenous  Once 11/29/21 1932 11/29/21 2116   11/29/21 1945  vancomycin (VANCOREADY) IVPB 2000 mg/400 mL        2,000 mg 200 mL/hr over 120 Minutes Intravenous  Once 11/29/21 1932 11/30/21 0538   11/29/21 1930  aztreonam (AZACTAM) 2 g in sodium chloride 0.9 % 100 mL IVPB  Status:  Discontinued        2 g 200 mL/hr over 30 Minutes Intravenous  Once 11/29/21 1926 11/29/21 1931   11/29/21 1930  metroNIDAZOLE (FLAGYL) IVPB 500 mg        500 mg 100 mL/hr over 60 Minutes Intravenous  Once 11/29/21 1926 11/29/21 2131   11/29/21 1930  vancomycin (VANCOCIN) IVPB 1000 mg/200 mL premix  Status:  Discontinued  1,000 mg 200 mL/hr over 60 Minutes Intravenous  Once 11/29/21 1926 11/29/21 1930       Assessment/Plan: s/p Procedure(s): AMPUTATION TOE Impression: Status post left great toe amputation.  Leukocytosis has resolved.  Final cultures reveal Enterococcus faecalis/strep mitis.  Will discuss with Dr. Manuella Ghazi. Plan: We will take patient back to the operating room tomorrow for closure of his wound.  The risks and benefits of the procedure were fully explained to the patient, who gave informed consent.  LOS: 6 days    Aviva Signs 12/05/2021

## 2021-12-05 NOTE — Progress Notes (Signed)
PROGRESS NOTE    [name]  GHW:[id number] DOB: 03/19/1950 DOA: 11/29/2021 PCP: Sharilyn Sites, MD   Brief Narrative:  [name] is a 71 y.o. male with medical history significant of cognitive impairment, anxiety, diabetes mellitus type 2, GERD, hyperlipidemia, hypertension, osteomyelitis with right great toe amputation, renal insufficiency, and more presents the ED with a chief complaint of feeling hot and vomiting.  Patient reports he laid down around 5 PM.  He woke up from his nap and he was diaphoretic, flushed, and nauseous.  He reports 1 episode of vomiting.  It was nonbloody.  Patient reports he checked his sugar and it was 300.  He reports that his sugar is usually between 100 and 200.  Patient reports he takes 20 units of insulin at home.  He reports that he has had polyuria, and polydipsia today.  He denies any polyphasia.  He reports he has had a fever at home.  Tylenol has helped it.  He has had a headache and is felt dizzy.  He reports the headache is feeling "swimmy headed."  Patient reports that he had swelling in his left great toe for 2 or 3 months.  He reports that Sunday he had purulent drainage.  He had no trauma to the toe.  He does not have any pain in that toe.  Patient has no other complaints at this time.   Patient does not smoke, does not drink, does not use illicit drugs.  Patient is vaccinated for COVID.  Patient is full code.  ED Temp 98.8-101.1, heart rate 101-20 35, respiratory rate 16-27, blood pressure 116/56-148/69, satting 95-97% Leukocytosis 18.3, hemoglobin stable at 9.0, platelets 346 Chemistry reveals a creatinine that is at his baseline 1.77, hyperglycemia at 252 Negative COVID Albumin is low at 2.6 Blood cultures pending Chest x-ray negative Left foot x-ray shows possible osteomyelitis Pain, bank, Flagyl started EKG shows sinus tach with a heart rate of 134, QTc 493 Admission requested for SIRS secondary to osteomyelitis    Assessment &  Plan:   Principal Problem:   Sepsis ([place]) Active Problems:   Bedbug bite with infection   Hypertension   Anxiety   DM (diabetes mellitus) ([place])   Osteomyelitis ([place])   Amputated great toe of right foot (HCC)   Stage 3a chronic kidney disease (HCC)   AKI (acute kidney injury) ([place])   Hyperlipidemia   Osteomyelitis of great toe of left foot ([place])  Assessment and Plan:   Sepsis, POA, secondary to Osteomyelitis of Left Great Toe status post amputation 10/19 Leukocytosis downward trending.  Gram stain showed abundant WBC, abundant gram negative rods, and few gram positive cocci in pairs and chains. Wound culture final results are pending. Initial blood cultures on admission were positive for Strep. agalactiae but repeat blood cultures show no growth after 2 days. Patient is being treated with IV Rocephin 2 g (Day 5) with total antibiotic duration to be determined after wound culture results.  He has been on total 7-day course of antibiotics thus far.  Patient is no longer NPO so IV fluids discontinued and PT/OT consulted to evaluate needs. -Continue to monitor vitals and CBC -Continue Rocephin -Appreciate ID recommendations once final culture results and sensitivities return -General surgery planning for wound closure in the next few days   Pharyngitis Patient has a history of Hay Fever. Patient has no other symptoms and remains afebrile. Flonase ordered yesterday and patient states symptoms have improved.   DM (diabetes mellitus) ([place]) A1C from  9/23 is 6.8. CBG currently at 217. Yesterday, Levemir increased to 20 u QHS and Novolog increased to 7 u TID with meals.  -Continue Levemir 20 u QHS -Continue Novolog TID with SSI   Hypoalbuminemia Albumin is 2.1 (2.0 yesterday). Patient reports good appetite and has been having Glucerna shakes TID. -Continue to monitor   Hyperlipidemia - Continue Lipitor - Heart healthy diet   AKI on CKD Stage 3a Resolved   Anxiety -Continue Zoloft  and Bupropion   Hypertension -Currently stable   Bedbug bite with infection Resolved     DVT prophylaxis: Heparin Code Status: Full Family Communication: None at bedside Disposition Plan:  Status is: Inpatient Remains inpatient appropriate because: Need for ongoing IV medications.     Consultants:  General surgery   Procedures:  Left great toe amputation 10/19   Antimicrobials:  Anti-infectives (From admission, onward)    Start     Dose/Rate Route Frequency Ordered Stop   12/05/21 1115  metroNIDAZOLE (FLAGYL) tablet 500 mg        500 mg Oral Every 12 hours 12/05/21 1025     12/01/21 2000  cefTRIAXone (ROCEPHIN) 2 g in sodium chloride 0.9 % 100 mL IVPB        2 g 200 mL/hr over 30 Minutes Intravenous Every 24 hours 12/01/21 1013     11/30/21 2200  cefTRIAXone (ROCEPHIN) 2 g in sodium chloride 0.9 % 100 mL IVPB  Status:  Discontinued        2 g 200 mL/hr over 30 Minutes Intravenous Every 24 hours 11/29/21 2349 11/30/21 0751   11/30/21 2100  vancomycin (VANCOREADY) IVPB 1500 mg/300 mL  Status:  Discontinued        1,500 mg 150 mL/hr over 120 Minutes Intravenous Every 24 hours 11/29/21 2022 12/01/21 1004   11/30/21 1000  ceFEPIme (MAXIPIME) 2 g in sodium chloride 0.9 % 100 mL IVPB  Status:  Discontinued        2 g 200 mL/hr over 30 Minutes Intravenous Every 12 hours 11/29/21 2022 12/01/21 1004   11/30/21 0800  metroNIDAZOLE (FLAGYL) IVPB 500 mg  Status:  Discontinued        500 mg 100 mL/hr over 60 Minutes Intravenous Every 12 hours 11/30/21 0752 12/01/21 1004   11/29/21 1945  ceFEPIme (MAXIPIME) 2 g in sodium chloride 0.9 % 100 mL IVPB        2 g 200 mL/hr over 30 Minutes Intravenous  Once 11/29/21 1932 11/29/21 2116   11/29/21 1945  vancomycin (VANCOREADY) IVPB 2000 mg/400 mL        2,000 mg 200 mL/hr over 120 Minutes Intravenous  Once 11/29/21 1932 11/30/21 0538   11/29/21 1930  aztreonam (AZACTAM) 2 g in sodium chloride 0.9 % 100 mL IVPB  Status:  Discontinued         2 g 200 mL/hr over 30 Minutes Intravenous  Once 11/29/21 1926 11/29/21 1931   11/29/21 1930  metroNIDAZOLE (FLAGYL) IVPB 500 mg        500 mg 100 mL/hr over 60 Minutes Intravenous  Once 11/29/21 1926 11/29/21 2131   11/29/21 1930  vancomycin (VANCOCIN) IVPB 1000 mg/200 mL premix  Status:  Discontinued        1,000 mg 200 mL/hr over 60 Minutes Intravenous  Once 11/29/21 1926 11/29/21 1930      Subjective: Patient seen and evaluated today with no new acute complaints or concerns. No acute concerns or events noted overnight.  He denies any significant pain to  his foot.  Objective: Vitals:   12/04/21 0353 12/04/21 1252 12/04/21 2000 12/05/21 0418  BP: (!) 143/76 (!) 160/69 (!) 145/54 (!) 143/72  Pulse: 88 94 94 94  Resp: '18 18 18 18  '$ Temp: (!) 97.3 F (36.3 C) 98.1 F (36.7 C) 98.7 F (37.1 C) 98.3 F (36.8 C)  TempSrc:  Oral    SpO2: 95% 100% 97% 100%  Weight:      Height:        Intake/Output Summary (Last 24 hours) at 12/05/2021 1027 Last data filed at 12/05/2021 0736 Gross per 24 hour  Intake 938.62 ml  Output 3900 ml  Net -2961.38 ml   Filed Weights   11/29/21 1907 11/30/21 0908  Weight: 95.7 kg 95.4 kg    Examination:  General exam: Appears calm and comfortable  Respiratory system: Clear to auscultation. Respiratory effort normal. Cardiovascular system: S1 & S2 heard, RRR.  Gastrointestinal system: Abdomen is soft Central nervous system: Alert and awake Extremities: Left lower extremity dressings clean dry and intact Skin: No significant lesions noted Psychiatry: Flat affect.    Data Reviewed: I have personally reviewed following labs and imaging studies  CBC: Recent Labs  Lab 11/29/21 1910 11/30/21 0438 12/01/21 0440 12/02/21 0518 12/03/21 0520 12/04/21 0537 12/05/21 0522  WBC 18.3*   < > 18.1* 17.2* 15.6* 11.3* 9.8  NEUTROABS 17.1*  --   --   --   --   --   --   HGB 9.0*   < > 8.2* 7.6* 8.0* 8.8* 9.6*  HCT 26.5*   < > 25.1* 22.9* 24.3*  27.3* 29.3*  MCV 84.7   < > 86.0 85.1 85.9 85.3 84.9  PLT 346   < > 356 341 386 428* 440*   < > = values in this interval not displayed.   Basic Metabolic Panel: Recent Labs  Lab 11/29/21 1910 11/30/21 0438 12/01/21 0440 12/02/21 0518 12/03/21 0520  NA 130* 133* 130* 131* 135  K 3.9 3.8 3.8 3.8 4.0  CL 98 100 100 97* 96*  CO2 21* '22 24 28 29  '$ GLUCOSE 252* 232* 194* 141* 236*  BUN 31* 28* '21 15 19  '$ CREATININE 1.77* 1.51* 1.37* 1.35* 1.28*  CALCIUM 8.4* 8.5* 7.8* 8.0* 8.2*  MG  --  1.9  --   --  2.0   GFR: Estimated Creatinine Clearance: 67.7 mL/min (A) (by C-G formula based on SCr of 1.28 mg/dL (H)). Liver Function Tests: Recent Labs  Lab 11/29/21 1910 11/30/21 0438 12/01/21 0440 12/02/21 0518 12/03/21 0520  AST 18 13* 15 13* 21  ALT '20 18 17 15 19  '$ ALKPHOS 78 74 78 73 77  BILITOT 1.3* 1.0 0.8 0.7 0.4  PROT 6.9 6.5 6.0* 5.6* 5.8*  ALBUMIN 2.6* 2.5* 2.2* 2.0* 2.1*   No results for input(s): "LIPASE", "AMYLASE" in the last 168 hours. No results for input(s): "AMMONIA" in the last 168 hours. Coagulation Profile: Recent Labs  Lab 11/29/21 1910  INR 1.3*   Cardiac Enzymes: No results for input(s): "CKTOTAL", "CKMB", "CKMBINDEX", "TROPONINI" in the last 168 hours. BNP (last 3 results) No results for input(s): "PROBNP" in the last 8760 hours. HbA1C: No results for input(s): "HGBA1C" in the last 72 hours. CBG: Recent Labs  Lab 12/03/21 1140 12/03/21 1645 12/03/21 2021 12/04/21 2001 12/05/21 0736  GLUCAP 206* 163* 190* 193* 175*   Lipid Profile: No results for input(s): "CHOL", "HDL", "LDLCALC", "TRIG", "CHOLHDL", "LDLDIRECT" in the last 72 hours. Thyroid Function Tests: No results  for input(s): "TSH", "T4TOTAL", "FREET4", "T3FREE", "THYROIDAB" in the last 72 hours. Anemia Panel: No results for input(s): "VITAMINB12", "FOLATE", "FERRITIN", "TIBC", "IRON", "RETICCTPCT" in the last 72 hours. Sepsis Labs: Recent Labs  Lab 11/29/21 1910 11/29/21 2130  11/30/21 0821  LATICACIDVEN 1.8 1.2 1.1    Recent Results (from the past 240 hour(s))  Resp Panel by RT-PCR (Flu A&B, Covid) Anterior Nasal Swab     Status: None   Collection Time: 11/29/21  7:18 PM   Specimen: Anterior Nasal Swab  Result Value Ref Range Status   SARS Coronavirus 2 by RT PCR NEGATIVE NEGATIVE Final    Comment: (NOTE) SARS-CoV-2 target nucleic acids are NOT DETECTED.  The SARS-CoV-2 RNA is generally detectable in upper respiratory specimens during the acute phase of infection. The lowest concentration of SARS-CoV-2 viral copies this assay can detect is 138 copies/mL. A negative result does not preclude SARS-Cov-2 infection and should not be used as the sole basis for treatment or other patient management decisions. A negative result may occur with  improper specimen collection/handling, submission of specimen other than nasopharyngeal swab, presence of viral mutation(s) within the areas targeted by this assay, and inadequate number of viral copies(<138 copies/mL). A negative result must be combined with clinical observations, patient history, and epidemiological information. The expected result is Negative.  Fact Sheet for Patients:  EntrepreneurPulse.com.au  Fact Sheet for Healthcare Providers:  IncredibleEmployment.be  This test is no t yet approved or cleared by the [place] FDA and  has been authorized for detection and/or diagnosis of SARS-CoV-2 by FDA under an Emergency Use Authorization (EUA). This EUA will remain  in effect (meaning this test can be used) for the duration of the COVID-19 declaration under Section 564(b)(1) of the Act, 21 U.S.C.section 360bbb-3(b)(1), unless the authorization is terminated  or revoked sooner.       Influenza A by PCR NEGATIVE NEGATIVE Final   Influenza B by PCR NEGATIVE NEGATIVE Final    Comment: (NOTE) The Xpert Xpress SARS-CoV-2/FLU/RSV plus assay is intended as an aid in  the diagnosis of influenza from Nasopharyngeal swab specimens and should not be used as a sole basis for treatment. Nasal washings and aspirates are unacceptable for Xpert Xpress SARS-CoV-2/FLU/RSV testing.  Fact Sheet for Patients: EntrepreneurPulse.com.au  Fact Sheet for Healthcare Providers: IncredibleEmployment.be  This test is not yet approved or cleared by the [place] FDA and has been authorized for detection and/or diagnosis of SARS-CoV-2 by FDA under an Emergency Use Authorization (EUA). This EUA will remain in effect (meaning this test can be used) for the duration of the COVID-19 declaration under Section 564(b)(1) of the Act, 21 U.S.C. section 360bbb-3(b)(1), unless the authorization is terminated or revoked.  Performed at [facility], [address] [place], [place] [address]   Culture, blood (Routine x 2)     Status: None   Collection Time: 11/29/21  7:25 PM   Specimen: BLOOD RIGHT FOREARM  Result Value Ref Range Status   Specimen Description BLOOD RIGHT FOREARM  Final   Special Requests   Final    BOTTLES DRAWN AEROBIC AND ANAEROBIC Blood Culture results may not be optimal due to an excessive volume of blood received in culture bottles   Culture   Final    NO GROWTH 5 DAYS Performed at [facility], [address], [place], [place] [address]    Report Status 12/04/2021 FINAL  Final  Culture, blood (Routine x 2)     Status: Abnormal   Collection Time:  11/29/21  7:30 PM   Specimen: BLOOD RIGHT HAND  Result Value Ref Range Status   Specimen Description   Final    BLOOD RIGHT HAND Performed at [facility], [address] [place], [place] [address]    Special Requests   Final    BOTTLES DRAWN AEROBIC AND ANAEROBIC Blood Culture results may not be optimal due to an excessive volume of blood received in culture bottles Performed at [facility], [address], [place], [place] [address]    Culture  Setup Time   Final     AEROBIC BOTTLE ONLY GRAM POSITIVE COCCI Gram Stain Report Called to,Read Back By and Verified With: LEIGHANN PARSONS @ 1406 ON 11/30/21 C VARNER CRITICAL RESULT CALLED TO, READ BACK BY AND VERIFIED WITH: PHARMD STEVEN [place] ON 11/30/21 @ 1658 BY DRT Performed at [facility], [place] [address] [place], [place] [address]    Culture (A)  Final    STREPTOCOCCUS AGALACTIAE STREPTOCOCCUS MITIS/ORALIS    Report Status 12/03/2021 FINAL  Final   Organism ID, Bacteria STREPTOCOCCUS AGALACTIAE  Final   Organism ID, Bacteria STREPTOCOCCUS MITIS/ORALIS  Final      Susceptibility   Streptococcus agalactiae - MIC*    CLINDAMYCIN <=0.25 SENSITIVE Sensitive     AMPICILLIN <=0.25 SENSITIVE Sensitive     ERYTHROMYCIN 2 RESISTANT Resistant     VANCOMYCIN 0.5 SENSITIVE Sensitive     CEFTRIAXONE <=0.12 SENSITIVE Sensitive     LEVOFLOXACIN 0.5 SENSITIVE Sensitive     PENICILLIN <=0.06 SENSITIVE Sensitive     * STREPTOCOCCUS AGALACTIAE   Streptococcus mitis/oralis - MIC*    TETRACYCLINE >=16 RESISTANT Resistant     VANCOMYCIN 0.5 SENSITIVE Sensitive     CLINDAMYCIN >=1 RESISTANT Resistant     PENICILLIN <=0.06      CEFTRIAXONE <=0.12      * STREPTOCOCCUS MITIS/ORALIS  Blood Culture ID Panel (Reflexed)     Status: Abnormal   Collection Time: 11/29/21  7:30 PM  Result Value Ref Range Status   Enterococcus faecalis NOT DETECTED NOT DETECTED Final   Enterococcus Faecium NOT DETECTED NOT DETECTED Final   Listeria monocytogenes NOT DETECTED NOT DETECTED Final   Staphylococcus species NOT DETECTED NOT DETECTED Final   Staphylococcus aureus (BCID) NOT DETECTED NOT DETECTED Final   Staphylococcus epidermidis NOT DETECTED NOT DETECTED Final   Staphylococcus lugdunensis NOT DETECTED NOT DETECTED Final   Streptococcus species DETECTED (A) NOT DETECTED Final    Comment: CRITICAL RESULT CALLED TO, READ BACK BY AND VERIFIED WITH: PHARMD STEVEN [place] ON 11/30/21 @ 1658 BY DRT    Streptococcus agalactiae  DETECTED (A) NOT DETECTED Final    Comment: CRITICAL RESULT CALLED TO, READ BACK BY AND VERIFIED WITH: PHARMD STEVEN [place] ON 11/30/21 @ 8341 BY DRT    Streptococcus pneumoniae NOT DETECTED NOT DETECTED Final   Streptococcus pyogenes NOT DETECTED NOT DETECTED Final   A.calcoaceticus-baumannii NOT DETECTED NOT DETECTED Final   Bacteroides fragilis NOT DETECTED NOT DETECTED Final   Enterobacterales NOT DETECTED NOT DETECTED Final   Enterobacter cloacae complex NOT DETECTED NOT DETECTED Final   Escherichia coli NOT DETECTED NOT DETECTED Final   Klebsiella aerogenes NOT DETECTED NOT DETECTED Final   Klebsiella oxytoca NOT DETECTED NOT DETECTED Final   Klebsiella pneumoniae NOT DETECTED NOT DETECTED Final   Proteus species NOT DETECTED NOT DETECTED Final   Salmonella species NOT DETECTED NOT DETECTED Final   Serratia marcescens NOT DETECTED NOT DETECTED Final   Haemophilus influenzae NOT DETECTED NOT  DETECTED Final   Neisseria meningitidis NOT DETECTED NOT DETECTED Final   Pseudomonas aeruginosa NOT DETECTED NOT DETECTED Final   Stenotrophomonas maltophilia NOT DETECTED NOT DETECTED Final   Candida albicans NOT DETECTED NOT DETECTED Final   Candida auris NOT DETECTED NOT DETECTED Final   Candida glabrata NOT DETECTED NOT DETECTED Final   Candida krusei NOT DETECTED NOT DETECTED Final   Candida parapsilosis NOT DETECTED NOT DETECTED Final   Candida tropicalis NOT DETECTED NOT DETECTED Final   Cryptococcus neoformans/gattii NOT DETECTED NOT DETECTED Final    Comment: Performed at [facility], [place] [address], [place], [place] [address]  Culture, blood (Routine X 2) w Reflex to ID Panel     Status: None (Preliminary result)   Collection Time: 12/01/21  7:45 AM   Specimen: Right Antecubital; Blood  Result Value Ref Range Status   Specimen Description RIGHT ANTECUBITAL  Final   Special Requests   Final    BOTTLES DRAWN AEROBIC AND ANAEROBIC Blood Culture results may not be optimal due  to an excessive volume of blood received in culture bottles   Culture   Final    NO GROWTH 4 DAYS Performed at [facility], [address] [place], [place] [address]    Report Status PENDING  Incomplete  Culture, blood (Routine X 2) w Reflex to ID Panel     Status: None (Preliminary result)   Collection Time: 12/01/21  7:51 AM   Specimen: BLOOD LEFT HAND  Result Value Ref Range Status   Specimen Description BLOOD LEFT HAND  Final   Special Requests   Final    BOTTLES DRAWN AEROBIC AND ANAEROBIC Blood Culture results may not be optimal due to an excessive volume of blood received in culture bottles   Culture   Final    NO GROWTH 4 DAYS Performed at [facility], [address], [place], [place] [address]    Report Status PENDING  Incomplete  MRSA Next Gen by PCR, Nasal     Status: None   Collection Time: 12/02/21 10:30 AM   Specimen: Nasal Mucosa; Nasal Swab  Result Value Ref Range Status   MRSA by PCR Next Gen NOT DETECTED NOT DETECTED Final    Comment: (NOTE) The GeneXpert MRSA Assay (FDA approved for NASAL specimens only), is one component of a comprehensive MRSA colonization surveillance program. It is not intended to diagnose MRSA infection nor to guide or monitor treatment for MRSA infections. Test performance is not FDA approved in patients less than 65 years old. Performed at [facility], [address], [place], [place] [address]   Aerobic/Anaerobic Culture w Gram Stain (surgical/deep wound)     Status: None (Preliminary result)   Collection Time: 12/02/21 12:31 PM   Specimen: Wound  Result Value Ref Range Status   Specimen Description WOUND  Final   Special Requests NONE  Final   Gram Stain   Final    ABUNDANT WBC PRESENT,BOTH PMN AND MONONUCLEAR ABUNDANT GRAM NEGATIVE RODS FEW GRAM POSITIVE COCCI IN PAIRS IN CHAINS    Culture   Final    MODERATE ENTEROCOCCUS FAECALIS MODERATE STREPTOCOCCUS MITIS/ORALIS RARE GRAM NEGATIVE RODS IDENTIFICATION AND SUSCEPTIBILITIES  TO FOLLOW MODERATE PREVOTELLA BIVIA BETA LACTAMASE POSITIVE    Report Status PENDING  Incomplete   Organism ID, Bacteria ENTEROCOCCUS FAECALIS  Final   Organism ID, Bacteria STREPTOCOCCUS MITIS/ORALIS  Final      Susceptibility   Enterococcus faecalis - MIC*    AMPICILLIN <=2 SENSITIVE Sensitive  VANCOMYCIN 1 SENSITIVE Sensitive     GENTAMICIN SYNERGY Value in next row Sensitive      SENSITIVEPerformed at [facility] [address], [place], [place] [address]    * MODERATE ENTEROCOCCUS FAECALIS   Streptococcus mitis/oralis - MIC*    TETRACYCLINE Value in next row Resistant      SENSITIVEPerformed at [facility] [address], [place], [place] [address]    VANCOMYCIN Value in next row Sensitive      SENSITIVEPerformed at [facility] [address], [place], [place] [address]    CLINDAMYCIN Value in next row Resistant      SENSITIVEPerformed at [facility] [address], [place], [place] [address]    * MODERATE STREPTOCOCCUS MITIS/ORALIS         Radiology Studies: No results found.      Scheduled Meds:  atorvastatin  10 mg Oral QHS   buPROPion  150 mg Oral QHS   feeding supplement (GLUCERNA SHAKE)  237 mL Oral TID BM   ferrous sulfate  325 mg Oral Q breakfast   fluticasone  1 spray Each Nare Daily   heparin  5,000 Units Subcutaneous Q8H   insulin aspart  0-15 Units Subcutaneous TID WC   insulin aspart  0-5 Units Subcutaneous QHS   insulin aspart  7 Units Subcutaneous TID WC   insulin detemir  20 Units Subcutaneous QHS   lactobacillus  1 g Oral TID WC   metroNIDAZOLE  500 mg Oral Q12H   pantoprazole  40 mg Oral Daily   permethrin   Topical Once   polyethylene glycol  17 g Oral Daily   sertraline  100 mg Oral QHS   sodium bicarbonate  650 mg Oral BID   Continuous Infusions:  cefTRIAXone (ROCEPHIN)  IV Stopped (12/04/21 2121)     LOS: 6 days    Time spent: 35 minutes    Lorely Bubb Darleen Crocker, DO [facility]  If  7PM-7AM, please contact night-coverage [email or web address] 12/05/2021, 10:27 AM

## 2021-12-06 ENCOUNTER — Encounter (HOSPITAL_COMMUNITY): Payer: Self-pay | Admitting: Family Medicine

## 2021-12-06 ENCOUNTER — Other Ambulatory Visit: Payer: Self-pay

## 2021-12-06 ENCOUNTER — Inpatient Hospital Stay (HOSPITAL_COMMUNITY): Payer: Medicare Other | Admitting: Anesthesiology

## 2021-12-06 ENCOUNTER — Encounter (HOSPITAL_COMMUNITY)
Admission: EM | Disposition: A | Discharge status: Home / Self Care | Payer: Self-pay | Source: Home / Self Care | Attending: Internal Medicine

## 2021-12-06 DIAGNOSIS — Z794 Long term (current) use of insulin: Secondary | ICD-10-CM

## 2021-12-06 DIAGNOSIS — T879 Unspecified complications of amputation stump: Secondary | ICD-10-CM

## 2021-12-06 DIAGNOSIS — R6520 Severe sepsis without septic shock: Secondary | ICD-10-CM | POA: Diagnosis not present

## 2021-12-06 DIAGNOSIS — N179 Acute kidney failure, unspecified: Secondary | ICD-10-CM | POA: Diagnosis not present

## 2021-12-06 DIAGNOSIS — I129 Hypertensive chronic kidney disease with stage 1 through stage 4 chronic kidney disease, or unspecified chronic kidney disease: Secondary | ICD-10-CM

## 2021-12-06 DIAGNOSIS — N189 Chronic kidney disease, unspecified: Secondary | ICD-10-CM

## 2021-12-06 DIAGNOSIS — A419 Sepsis, unspecified organism: Secondary | ICD-10-CM | POA: Diagnosis not present

## 2021-12-06 DIAGNOSIS — E1122 Type 2 diabetes mellitus with diabetic chronic kidney disease: Secondary | ICD-10-CM

## 2021-12-06 HISTORY — PX: AMPUTATION TOE: SHX6595

## 2021-12-06 LAB — CBC
HCT: 29.8 % — ABNORMAL LOW (ref 39.0–52.0)
Hemoglobin: 9.7 g/dL — ABNORMAL LOW (ref 13.0–17.0)
MCH: 27.8 pg (ref 26.0–34.0)
MCHC: 32.6 g/dL (ref 30.0–36.0)
MCV: 85.4 fL (ref 80.0–100.0)
Platelets: 450 10*3/uL — ABNORMAL HIGH (ref 150–400)
RBC: 3.49 MIL/uL — ABNORMAL LOW (ref 4.22–5.81)
RDW: 13.4 % (ref 11.5–15.5)
WBC: 10 10*3/uL (ref 4.0–10.5)
nRBC: 0 % (ref 0.0–0.2)

## 2021-12-06 LAB — GLUCOSE, CAPILLARY
Glucose-Capillary: 175 mg/dL — ABNORMAL HIGH (ref 70–99)
Glucose-Capillary: 165 mg/dL — ABNORMAL HIGH (ref 70–99)
Glucose-Capillary: 157 mg/dL — ABNORMAL HIGH (ref 70–99)
Glucose-Capillary: 241 mg/dL — ABNORMAL HIGH (ref 70–99)

## 2021-12-06 LAB — CULTURE, BLOOD (ROUTINE X 2)
Culture: NO GROWTH
Culture: NO GROWTH

## 2021-12-06 LAB — SURGICAL PATHOLOGY

## 2021-12-06 LAB — SURGICAL PCR SCREEN
MRSA, PCR: NEGATIVE
Staphylococcus aureus: NEGATIVE

## 2021-12-06 SURGERY — AMPUTATION, TOE
Anesthesia: General | Site: Toe | Laterality: Left

## 2021-12-06 MED ORDER — ONDANSETRON HCL 4 MG/2ML IJ SOLN: 4.0000 mg | Freq: Once | INTRAMUSCULAR | Status: DC | PRN

## 2021-12-06 MED ORDER — PHENYLEPHRINE HCL (PRESSORS) 10 MG/ML IV SOLN
INTRAVENOUS | Status: DC | PRN
  Administered 2021-12-06: 80 ug via INTRAVENOUS
  Administered 2021-12-06: 160 ug via INTRAVENOUS
  Administered 2021-12-06 (×3): 80 ug via INTRAVENOUS
  Administered 2021-12-06: 160 ug via INTRAVENOUS
  Administered 2021-12-06: 80 ug via INTRAVENOUS
  Administered 2021-12-06: 160 ug via INTRAVENOUS
  Administered 2021-12-06 (×2): 80 ug via INTRAVENOUS

## 2021-12-06 MED ORDER — FENTANYL CITRATE (PF) 100 MCG/2ML IJ SOLN
INTRAMUSCULAR | Status: AC
  Filled 2021-12-06: qty 2

## 2021-12-06 MED ORDER — ORAL CARE MOUTH RINSE: 15.0000 mL | Freq: Once | OROMUCOSAL | Status: DC

## 2021-12-06 MED ORDER — LIDOCAINE HCL (PF) 1 % IJ SOLN
INTRAMUSCULAR | Status: AC
  Filled 2021-12-06: qty 30

## 2021-12-06 MED ORDER — LACTATED RINGERS IV SOLN: INTRAVENOUS | Status: DC

## 2021-12-06 MED ORDER — SODIUM CHLORIDE 0.9 % IR SOLN
Status: DC | PRN
  Administered 2021-12-06: 1

## 2021-12-06 MED ORDER — LIDOCAINE HCL (CARDIAC) PF 100 MG/5ML IV SOSY
PREFILLED_SYRINGE | INTRAVENOUS | Status: DC | PRN
  Administered 2021-12-06: 100 mg via INTRAVENOUS

## 2021-12-06 MED ORDER — OXYCODONE HCL 5 MG PO TABS: 5.0000 mg | ORAL_TABLET | Freq: Once | ORAL | Status: DC | PRN

## 2021-12-06 MED ORDER — FENTANYL CITRATE (PF) 100 MCG/2ML IJ SOLN
INTRAMUSCULAR | Status: DC | PRN
  Administered 2021-12-06 (×2): 25 ug via INTRAVENOUS

## 2021-12-06 MED ORDER — OXYCODONE HCL 5 MG/5ML PO SOLN: 5.0000 mg | Freq: Once | ORAL | Status: DC | PRN

## 2021-12-06 MED ORDER — FENTANYL CITRATE PF 50 MCG/ML IJ SOSY: 25.0000 ug | PREFILLED_SYRINGE | INTRAMUSCULAR | Status: DC | PRN

## 2021-12-06 MED ORDER — PROPOFOL 10 MG/ML IV BOLUS
INTRAVENOUS | Status: AC
  Filled 2021-12-06: qty 20

## 2021-12-06 MED ORDER — CHLORHEXIDINE GLUCONATE 0.12 % MT SOLN
OROMUCOSAL | Status: AC
  Administered 2021-12-06: 15 mL via OROMUCOSAL
  Filled 2021-12-06: qty 15

## 2021-12-06 MED ORDER — PROPOFOL 10 MG/ML IV BOLUS
INTRAVENOUS | Status: DC | PRN
  Administered 2021-12-06: 200 mg via INTRAVENOUS

## 2021-12-06 MED ORDER — CHLORHEXIDINE GLUCONATE CLOTH 2 % EX PADS
6.0000 | MEDICATED_PAD | Freq: Once | CUTANEOUS | Status: DC
  Administered 2021-12-06: 6 via TOPICAL

## 2021-12-06 MED ORDER — CHLORHEXIDINE GLUCONATE 0.12 % MT SOLN: 15.0000 mL | Freq: Once | OROMUCOSAL | Status: AC

## 2021-12-06 MED ORDER — EPHEDRINE SULFATE (PRESSORS) 50 MG/ML IJ SOLN
INTRAMUSCULAR | Status: DC | PRN
  Administered 2021-12-06 (×5): 5 mg via INTRAVENOUS

## 2021-12-06 MED ORDER — ORAL CARE MOUTH RINSE: 15.0000 mL | Freq: Once | OROMUCOSAL | Status: AC

## 2021-12-06 MED ORDER — CHLORHEXIDINE GLUCONATE 0.12 % MT SOLN: 15.0000 mL | Freq: Once | OROMUCOSAL | Status: DC

## 2021-12-06 SURGICAL SUPPLY — 38 items
BLADE OSC/SAGITTAL MD 9X18.5 (BLADE) IMPLANT
BNDG CMPR STD VLCR NS LF 5.8X4 (GAUZE/BANDAGES/DRESSINGS)
BNDG CONFORM 3 STRL LF (GAUZE/BANDAGES/DRESSINGS) IMPLANT
BNDG ELASTIC 3X5.8 VLCR STR LF (GAUZE/BANDAGES/DRESSINGS) IMPLANT
BNDG ELASTIC 4X5.8 VLCR NS LF (GAUZE/BANDAGES/DRESSINGS) IMPLANT
BNDG GAUZE DERMACEA FLUFF 4 (GAUZE/BANDAGES/DRESSINGS) IMPLANT
BNDG GAUZE ELAST 4 BULKY (GAUZE/BANDAGES/DRESSINGS) IMPLANT
BNDG GZE DERMACEA 4 6PLY (GAUZE/BANDAGES/DRESSINGS) ×1
CLOTH BEACON ORANGE TIMEOUT ST (SAFETY) ×2 IMPLANT
COVER LIGHT HANDLE STERIS (MISCELLANEOUS) ×4 IMPLANT
ELECT REM PT RETURN 9FT ADLT (ELECTROSURGICAL) ×1
ELECTRODE REM PT RTRN 9FT ADLT (ELECTROSURGICAL) ×2 IMPLANT
GAUZE SPONGE 4X4 12PLY STRL (GAUZE/BANDAGES/DRESSINGS) ×2 IMPLANT
GAUZE XEROFORM 1X8 LF (GAUZE/BANDAGES/DRESSINGS) IMPLANT
GLOVE BIO SURGEON STRL SZ 6.5 (GLOVE) IMPLANT
GLOVE BIOGEL PI IND STRL 6.5 (GLOVE) IMPLANT
GLOVE BIOGEL PI IND STRL 7.0 (GLOVE) ×4 IMPLANT
GLOVE SURG SS PI 7.5 STRL IVOR (GLOVE) ×2 IMPLANT
GOWN STRL REUS W/TWL LRG LVL3 (GOWN DISPOSABLE) ×6 IMPLANT
INST SET MINOR BONE (KITS) ×2 IMPLANT
KIT TURNOVER KIT A (KITS) ×2 IMPLANT
MANIFOLD NEPTUNE II (INSTRUMENTS) ×2 IMPLANT
NDL HYPO 18GX1.5 BLUNT FILL (NEEDLE) ×2 IMPLANT
NDL HYPO 21X1.5 SAFETY (NEEDLE) ×2 IMPLANT
NEEDLE HYPO 18GX1.5 BLUNT FILL (NEEDLE) IMPLANT
NEEDLE HYPO 21X1.5 SAFETY (NEEDLE) ×1 IMPLANT
NS IRRIG 1000ML POUR BTL (IV SOLUTION) ×2 IMPLANT
PACK BASIC LIMB (CUSTOM PROCEDURE TRAY) ×2 IMPLANT
PAD ARMBOARD 7.5X6 YLW CONV (MISCELLANEOUS) ×2 IMPLANT
SET BASIN LINEN APH (SET/KITS/TRAYS/PACK) ×2 IMPLANT
SOL PREP PROV IODINE SCRUB 4OZ (MISCELLANEOUS) ×2 IMPLANT
SPONGE GAUZE 2X2 8PLY STRL LF (GAUZE/BANDAGES/DRESSINGS) ×2 IMPLANT
SPONGE T-LAP 18X18 ~~LOC~~+RFID (SPONGE) ×2 IMPLANT
SUT PROLENE 2 0 SH 30 (SUTURE) IMPLANT
SWAB CULTURE ESWAB REG 1ML (MISCELLANEOUS) IMPLANT
SYR 30ML LL (SYRINGE) ×2 IMPLANT
SYR BULB IRRIG 60ML STRL (SYRINGE) ×2 IMPLANT
SYR CONTROL 10ML LL (SYRINGE) ×2 IMPLANT

## 2021-12-06 NOTE — Plan of Care (Signed)
Id brief note   Strep mitis bacteremia Dfu with om s/p amputation 10/23 (tma) and primary closure    A/p Once taking po would transition antibiotics to amox-clav and bactrim to finish 7 more days of antibiotics starting 12/06/21 Id will sign off Discuss with primary team    Augmentin 875 mg bid Bactrim ds 1 tablet po bid

## 2021-12-06 NOTE — Progress Notes (Addendum)
Patient NPO after MN for surgical procedure later today. Scheduled bedtime and 6 am Sub q Heparin not given 12/05/21, per on-call MD verbal order. Changed left great toe dressing; flushed surgical wound with NS, applied wet to dry gauze, and wrapped in gauze bandage. PCR swab obtained and sent to lab for testing. CHG cloths used to wipe patient. Patient tolerated well.Consent signed and placed in chart.

## 2021-12-06 NOTE — Anesthesia Procedure Notes (Signed)
Procedure Name: LMA Insertion Date/Time: 12/06/2021 12:10 PM  Performed by: Camillia Herter, RNPre-anesthesia Checklist: Patient identified, Emergency Drugs available, Suction available and Patient being monitored Patient Re-evaluated:Patient Re-evaluated prior to induction Oxygen Delivery Method: Circle system utilized Preoxygenation: Pre-oxygenation with 100% oxygen Induction Type: IV induction Ventilation: Mask ventilation without difficulty LMA: LMA inserted LMA Size: 4.0 Number of attempts: 1 Placement Confirmation: positive ETCO2 and breath sounds checked- equal and bilateral Tube secured with: Tape Dental Injury: Teeth and Oropharynx as per pre-operative assessment

## 2021-12-06 NOTE — Progress Notes (Signed)
Gave patient Chiropodist.  Great patient effort.  Patient able to do 2585m X10.  Left at bedside with patient.

## 2021-12-06 NOTE — Transfer of Care (Signed)
Immediate Anesthesia Transfer of Care Note  Patient: Ewel Lona Arnone  Procedure(s) Performed: CLOSURE OF LEFT GREAT TOE AMPUTATION (Left: Toe)  Patient Location: PACU  Anesthesia Type:General  Level of Consciousness: drowsy  Airway & Oxygen Therapy: Patient connected to nasal cannula oxygen  Post-op Assessment: Report given to RN and Post -op Vital signs reviewed and stable  Post vital signs: Reviewed and stable  Last Vitals:  Vitals Value Taken Time  BP 113/58 12/06/21 1306  Temp 36.7 C 12/06/21 1305  Pulse 98 12/06/21 1312  Resp 16 12/06/21 1312  SpO2 99 % 12/06/21 1312  Vitals shown include unvalidated device data.  Last Pain:  Vitals:   12/06/21 1040  TempSrc: Oral  PainSc: 0-No pain         Complications: No notable events documented.

## 2021-12-06 NOTE — Interval H&P Note (Signed)
History and Physical Interval Note:  12/06/2021 11:30 AM  [name]  has presented today for surgery, with the diagnosis of closure of toe amputation.  The various methods of treatment have been discussed with the patient and family. After consideration of risks, benefits and other options for treatment, the patient has consented to  Procedure(s): CLOSURE OF LEFT GREAT TOE AMPUTATION (Left) as a surgical intervention.  The patient's history has been reviewed, patient examined, no change in status, stable for surgery.  I have reviewed the patient's chart and labs.  Questions were answered to the patient's satisfaction.     Aviva Signs

## 2021-12-06 NOTE — Op Note (Signed)
Patient:  [name]  DOB:  09/06/1950  MRN:  [id number]   Preop Diagnosis: Status post left great toe amputation  Postop Diagnosis: Same  Procedure: Transmetatarsal amputation of left great toe with closure of wound  Surgeon: Aviva Signs, MD  Anes: General  Indications: Patient is a 71 year old white male status post an open left great toe amputation down to the interphalangeal joint.  The patient has been undergoing wound care and now presents for closure of the amputation site.  The risks and benefits of the procedure including bleeding, infection, the need for more bone removal, and the possibility of wound breakdown were fully explained to the patient, who gave informed consent.  Procedure note: The patient was placed in the supine position.  After general anesthesia was administered, the left foot was prepped and draped using the usual sterile technique with Betadine.  Surgical site confirmation was performed.  The previous packing was removed.  There was a small amount of purulent necrotic fluid that emanated from the plantar aspect of the left great toe amputation site.  This was removed and evacuated.  Any necrotic tissue was debrided.  This was especially done on the tendoness sheets.  Using a reciprocating saw, a transmetatarsal amputation of the remaining left great toe joint was removed.  The bleeding was controlled using Bovie electrocautery.  The wound was again irrigated with normal saline.  Xeroform was placed along the base of the wound and brought out along the plantar aspect of the incision.  The incision was then closed full-thickness with 2-0 Prolene interrupted and vertical mattress sutures.  Betadine ointment and a dry sterile dressing were applied.  All tape and needle counts were correct at the end of the procedure.  The patient was awakened and transferred to PACU in stable condition.  Complications: None  EBL: Minimal  Specimen: Transmetatarsal  amputation, left great toe

## 2021-12-06 NOTE — Progress Notes (Signed)
   Subjective:  Patient states he is feeling well overall. He is aware of his surgery planned for today and did not have any questions regarding it. No acute events overnight.  Objective:  Vital signs in last 24 hours: Vitals:   12/05/21 1620 12/05/21 1635 12/05/21 2240 12/06/21 1040  BP: (!) 146/57 (!) 140/69 137/64 (!) 150/79  Pulse: 89 89 95 96  Resp: '20 19 20 '$ (!) 24  Temp: 98.1 F (36.7 C) 97.7 F (36.5 C) 99 F (37.2 C) 98.5 F (36.9 C)  TempSrc: Axillary Axillary Axillary Oral  SpO2: 98% 97% 100% 100%  Weight:    95.4 kg  Height:    '6\' 5"'$  (1.956 m)   Weight change:   Intake/Output Summary (Last 24 hours) at 12/06/2021 1053 Last data filed at 12/06/2021 9326 Gross per 24 hour  Intake 480.31 ml  Output 3550 ml  Net -3069.69 ml   General: Lying in bed in no acute distress Head: Normocephalic, atraumatic Cardiovascular: regular rate and rhythm, without murmurs, rubs, or gallops, no LEE Respiratory: Normal work of breathing, lungs CTAB Abdominal: normoactive bowel sounds, no tenderness to palpation Skin: warm, dry Extremities: left foot bandaged but visible digits without erythema or significant swelling Neurocognitive: AAO x 3 Psychiatric: Normal mood and affect.   Assessment/Plan:  Principal Problem:   Sepsis ([place]) Active Problems:   Hypertension   Anxiety   DM (diabetes mellitus) ([place])   Osteomyelitis (H[facility])   Amputated great toe of right foot ([place])   Stage 3a chronic kidney disease ([place])   AKI (acute kidney injury) ([place])   Hyperlipidemia   Bedbug bite with infection   Osteomyelitis of great toe of left foot ([place])   Sepsis, POA, secondary to Osteomyelitis of Left Great Toe status post amputation 10/19 Patient remains afebrile and without leukocytosis Patient underwent left great toe amputation 10/19 and is scheduled for wound closure today. During initial procedure, purulent drainage was noted upon entering the soft tissue so aerobic/anaerobic gram  stain and cultures were taken. Gram stain showed abundant WBC, abundant gram negative rods, and few gram positive cocci in pairs and chains. Wound culture grew Enterococcus faecalis, Strep. Mitis/oralis, Proteus, and Prevotella. Patient had been receiving Rocephin prior to wound culture results. ID consulted and patient is currently receiving Zosyn for broad coverage of above. ID will determine outpatient antibiotic course of action later today. -Continue to monitor vitals and CBC -Continue Zosyn   Pharyngitis Resolved   DM (diabetes mellitus) ([place]) A1C from 9/23 is 6.8. CBG currently at 175. Patient receiving Levemir 20 u QHS and Novolog 7 u TID with meals.  -Continue Levemir 20 u QHS -Continue Novolog TID with SSI   Hypoalbuminemia Albumin is 2.1 as of 10/20. Patient reports good appetite and has been having Glucerna shakes TID.   Hyperlipidemia - Continue Lipitor - Heart healthy diet   AKI on CKD Stage 3a Resolved   Anxiety -Continue Zoloft and Bupropion   Hypertension -Currently stable   Bedbug bite with infection Resolved  LOS: [place] Days  Stanford Breed, Medical Student 12/06/2021, 10:53 AM

## 2021-12-06 NOTE — Anesthesia Preprocedure Evaluation (Signed)
Anesthesia Evaluation  Patient identified by MRN, date of birth, ID band Patient awake    Reviewed: Allergy & Precautions, H&P , NPO status , Patient's Chart, lab work & pertinent test results, reviewed documented beta blocker date and time   Airway Mallampati: II  TM Distance: >3 FB Neck ROM: full    Dental no notable dental hx.    Pulmonary neg pulmonary ROS,    Pulmonary exam normal breath sounds clear to auscultation       Cardiovascular Exercise Tolerance: Good hypertension, negative cardio ROS   Rhythm:regular Rate:Normal     Neuro/Psych PSYCHIATRIC DISORDERS Anxiety Depression negative neurological ROS     GI/Hepatic Neg liver ROS, GERD  Medicated,  Endo/Other  negative endocrine ROSdiabetes, Type 2  Renal/GU CRFRenal disease  negative genitourinary   Musculoskeletal   Abdominal   Peds  Hematology negative hematology ROS (+)   Anesthesia Other Findings   Reproductive/Obstetrics negative OB ROS                             Anesthesia Physical  Anesthesia Plan  ASA: 3  Anesthesia Plan: General and General LMA   Post-op Pain Management:    Induction:   PONV Risk Score and Plan: Ondansetron  Airway Management Planned:   Additional Equipment:   Intra-op Plan:   Post-operative Plan:   Informed Consent: I have reviewed the patients History and Physical, chart, labs and discussed the procedure including the risks, benefits and alternatives for the proposed anesthesia with the patient or authorized representative who has indicated his/her understanding and acceptance.     Dental Advisory Given  Plan Discussed with: CRNA  Anesthesia Plan Comments:         Anesthesia Quick Evaluation

## 2021-12-07 ENCOUNTER — Encounter (HOSPITAL_COMMUNITY): Payer: Self-pay | Admitting: General Surgery

## 2021-12-07 DIAGNOSIS — F419 Anxiety disorder, unspecified: Secondary | ICD-10-CM | POA: Diagnosis not present

## 2021-12-07 DIAGNOSIS — A419 Sepsis, unspecified organism: Secondary | ICD-10-CM | POA: Diagnosis not present

## 2021-12-07 DIAGNOSIS — S98111A Complete traumatic amputation of right great toe, initial encounter: Secondary | ICD-10-CM | POA: Diagnosis not present

## 2021-12-07 DIAGNOSIS — N179 Acute kidney failure, unspecified: Secondary | ICD-10-CM | POA: Diagnosis not present

## 2021-12-07 LAB — GLUCOSE, CAPILLARY
Glucose-Capillary: 134 mg/dL — ABNORMAL HIGH (ref 70–99)
Glucose-Capillary: 177 mg/dL — ABNORMAL HIGH (ref 70–99)

## 2021-12-07 LAB — BASIC METABOLIC PANEL
Anion gap: 8 (ref 5–15)
BUN: 28 mg/dL — ABNORMAL HIGH (ref 8–23)
CO2: 26 mmol/L (ref 22–32)
Calcium: 8.5 mg/dL — ABNORMAL LOW (ref 8.9–10.3)
Chloride: 100 mmol/L (ref 98–111)
Creatinine, Ser: 1.67 mg/dL — ABNORMAL HIGH (ref 0.61–1.24)
GFR, Estimated: 44 mL/min — ABNORMAL LOW (ref >60)
Glucose, Bld: 185 mg/dL — ABNORMAL HIGH (ref 70–99)
Potassium: 4.6 mmol/L (ref 3.5–5.1)
Sodium: 134 mmol/L — ABNORMAL LOW (ref 135–145)

## 2021-12-07 LAB — CBC
HCT: 31.4 % — ABNORMAL LOW (ref 39.0–52.0)
Hemoglobin: 10.2 g/dL — ABNORMAL LOW (ref 13.0–17.0)
MCH: 28.1 pg (ref 26.0–34.0)
MCHC: 32.5 g/dL (ref 30.0–36.0)
MCV: 86.5 fL (ref 80.0–100.0)
Platelets: 462 10*3/uL — ABNORMAL HIGH (ref 150–400)
RBC: 3.63 MIL/uL — ABNORMAL LOW (ref 4.22–5.81)
RDW: 13.4 % (ref 11.5–15.5)
WBC: 14.2 10*3/uL — ABNORMAL HIGH (ref 4.0–10.5)
nRBC: 0 % (ref 0.0–0.2)

## 2021-12-07 LAB — MAGNESIUM: Magnesium: 2.2 mg/dL (ref 1.7–2.4)

## 2021-12-07 MED ORDER — SULFAMETHOXAZOLE-TRIMETHOPRIM 800-160 MG PO TABS
1.0000 | ORAL_TABLET | Freq: Two times a day (BID) | ORAL | Status: DC
  Administered 2021-12-07 – 2021-12-08 (×2): 1 via ORAL
  Filled 2021-12-07 (×2): qty 1

## 2021-12-07 MED ORDER — AMOXICILLIN-POT CLAVULANATE 875-125 MG PO TABS
1.0000 | ORAL_TABLET | Freq: Two times a day (BID) | ORAL | Status: DC
  Administered 2021-12-07 – 2021-12-08 (×2): 1 via ORAL
  Filled 2021-12-07 (×2): qty 1

## 2021-12-07 NOTE — Progress Notes (Signed)
Internal medicine progress note:  Subjective: No fever, no chest pain, no nausea or vomiting.  Feeling better and willing to go home.  Reports no significant pain on his left foot.  Antibiotics will be transitioned to oral route.  Objective:  Vital signs in last 24 hours: Vitals:   12/06/21 2204 12/07/21 0517 12/07/21 1320 12/07/21 1626  BP: 135/61 (!) 145/73 132/62 132/68  Pulse: (!) 109 (!) 110 100 96  Resp: '18 15 18 20  '$ Temp: 98.5 F (36.9 C) 98.5 F (36.9 C) 98.6 F (37 C) 98.7 F (37.1 C)  TempSrc: Oral Oral Oral Oral  SpO2: 98% 98% 98% 98%  Weight:      Height:       Weight change:   Intake/Output Summary (Last 24 hours) at 12/07/2021 1650 Last data filed at 12/07/2021 1300 Gross per 24 hour  Intake 2044.17 ml  Output 2800 ml  Net -755.83 ml   General exam: Alert, awake, oriented x 3; no fever, no chest pain, no nausea or vomiting. Respiratory system: Clear to auscultation. Respiratory effort normal.  Good saturation on room air. Cardiovascular system:RRR. No murmurs, rubs, gallops. Gastrointestinal system: Abdomen is nondistended, soft and nontender. No organomegaly or masses felt. Normal bowel sounds heard. Central nervous system: Alert and oriented. No focal neurological deficits. Extremities: No cyanosis or clubbing; left fluid with clean dressings after great toe amputation.  Reports minimal pain. Skin: No petechiae. Psychiatry: Judgement and insight appear normal. Mood & affect appropriate.   Assessment/Plan:  Principal Problem:   Sepsis ([place]) Active Problems:   Bedbug bite with infection   Hypertension   Anxiety   DM (diabetes mellitus) ([place])   Osteomyelitis ([place])   Amputated great toe of right foot ([place])   Stage 3a chronic kidney disease ([place])   AKI (acute kidney injury) ([place])   Hyperlipidemia   Osteomyelitis of great toe of left foot ([place])   Sepsis, POA, secondary to Osteomyelitis of Left Great Toe status post amputation 10/19 Patient remains  afebrile and without leukocytosis Patient underwent left great toe amputation 10/19 and is scheduled for wound closure today. During initial procedure, purulent drainage was noted upon entering the soft tissue so aerobic/anaerobic gram stain and cultures were taken. Gram stain showed abundant WBC, abundant gram negative rods, and few gram positive cocci in pairs and chains. Wound culture grew Enterococcus faecalis, Strep. Mitis/oralis, Proteus, and Prevotella. Patient had been receiving Rocephin prior to wound culture results. ID consulted and patient was receiving Zosyn for broad coverage of above.  -Following ID recommendations plan is to treat with Augmentin for 1 more week. -Hopefully home at discharge tomorrow.   Pharyngitis -Treated on resolved   DM (diabetes mellitus) ([place]) -A1C from 9/23 is 6.8.  -Continue modified carbohydrate diet; follow CBGs fluctuation. -Continue Levemir 20 u QHS -Continue Novolog TID with SSI   Hypoalbuminemia -Albumin is 2.1  -Continue treatment with Glucerna 3 times a day -Adequate nutrition and hydration discussed with patient.   Hyperlipidemia - Continue Lipitor - Heart healthy diet encouraged.   AKI on CKD Stage 3a -Resolved after fluid resuscitation. -Maintain adequate hydration -Continue minimizing nephrotoxic agents.   Anxiety -Continue Zoloft and Bupropion -Mood stable.   Hypertension -Currently stable -Continue current antihypertensive regimen. -Heart healthy diet discussed with patient.   Bedbug infection -treated and Resolved  LOS: 8 Days  Barton Dubois, MD 12/07/2021, 4:50 PM

## 2021-12-07 NOTE — Progress Notes (Signed)
No acute events overnight. Pt denies pain. Left great toe wound dressing was changed ( wet to dry and wrapped) this AM. Foot was elevated onto 2 pillows per order. Pt remained in Sinus Tachycardia thorugh out most of the night ranging from 101-110 bpm. One episode of loose stool, no obvious blood noted. [name]

## 2021-12-07 NOTE — Progress Notes (Signed)
Physical Therapy Treatment Patient Details Name: [name] MRN: [id number] DOB: 1950/03/24 Today's Date: 12/07/2021   History of Present Illness [name] is a 71 y.o. male with medical history significant of cognitive impairment, anxiety, diabetes mellitus type 2, GERD, hyperlipidemia, hypertension, osteomyelitis with right great toe amputation, renal insufficiency, and more presents the ED with a chief complaint of feeling hot and vomiting.  Patient reports he laid down around 5 PM.  He woke up from his nap and he was diaphoretic, flushed, and nauseous.  He reports 1 episode of vomiting.  It was nonbloody.  Patient reports he checked his sugar and it was 300.  He reports that his sugar is usually between 100 and 200.  Patient reports he takes 20 units of insulin at home.  He reports that he has had polyuria, and polydipsia today.  He denies any polyphasia.  He reports he has had a fever at home.  Tylenol has helped it.  He has had a headache and is felt dizzy.  He reports the headache is feeling "swimmy headed."  Patient reports that he had swelling in his left great toe for 2 or 3 months.  He reports that Sunday he had purulent drainage.  He had no trauma to the toe.  He does not have any pain in that toe.  Patient has no other complaints at this time.    PT Comments    Patient slightly unsteady on feet upon standing, able to transfer to bathroom commode with min guard/RW. Patient able to ambulate in hallway with supervision/RW, limited in distance by having to use restroom. Patient then transferred back to bathroom commode, left on commode with LPN notified and patient educated to sit up in chair afterwards as tolerated. Patient will benefit from continued skilled physical therapy in hospital and recommended venue below to increase strength, balance, endurance for safe ADLs and gait.    Recommendations for follow up therapy are one component of a multi-disciplinary discharge planning  process, led by the attending physician.  Recommendations may be updated based on patient status, additional functional criteria and insurance authorization.  Follow Up Recommendations  Home health PT     Assistance Recommended at Discharge Intermittent Supervision/Assistance  Patient can return home with the following A little help with walking and/or transfers;A little help with bathing/dressing/bathroom;Assistance with cooking/housework;Help with stairs or ramp for entrance   Equipment Recommendations  Rolling walker (2 wheels)    Recommendations for Other Services       Precautions / Restrictions Precautions Precautions: Fall Restrictions Weight Bearing Restrictions: No     Mobility  Bed Mobility Overal bed mobility: Needs Assistance Bed Mobility: Supine to Sit     Supine to sit: Min guard          Transfers Overall transfer level: Needs assistance Equipment used: Rolling walker (2 wheels), None Transfers: Sit to/from Stand Sit to Stand: Min guard           General transfer comment: patient slightly unsteady on feet upon standing, able to transfer to commode in bathroom, ambulate, then transfer back to commode with min guard/RW    Ambulation/Gait Ambulation/Gait assistance: Min guard, Supervision Gait Distance (Feet): 45 Feet Assistive device: Rolling walker (2 wheels) Gait Pattern/deviations: Decreased stride length, Decreased step length - left, Decreased step length - right Gait velocity: decreased     General Gait Details: patient slightly unsteady on feet with slowed cadence, ambulates in hallway with RW/supervision, limited in distance due to having  to use restroom   Stairs             Wheelchair Mobility    Modified Rankin (Stroke Patients Only)       Balance Overall balance assessment: Needs assistance Sitting-balance support: No upper extremity supported, Feet supported Sitting balance-Leahy Scale: Good Sitting balance -  Comments: seated EOB   Standing balance support: Bilateral upper extremity supported, During functional activity Standing balance-Leahy Scale: Good Standing balance comment: using RW                            Cognition Arousal/Alertness: Awake/alert Behavior During Therapy: WFL for tasks assessed/performed Overall Cognitive Status: Within Functional Limits for tasks assessed                                          Exercises      General Comments        Pertinent Vitals/Pain Pain Assessment Pain Assessment: No/denies pain    Home Living                          Prior Function            PT Goals (current goals can now be found in the care plan section) Acute Rehab PT Goals Patient Stated Goal: Return home PT Goal Formulation: With patient Time For Goal Achievement: 12/14/21 Potential to Achieve Goals: Good Progress towards PT goals: Progressing toward goals    Frequency    Min 3X/week      PT Plan Current plan remains appropriate    Co-evaluation              AM-PAC PT "6 Clicks" Mobility   Outcome Measure  Help needed turning from your back to your side while in a flat bed without using bedrails?: None Help needed moving from lying on your back to sitting on the side of a flat bed without using bedrails?: A Little Help needed moving to and from a bed to a chair (including a wheelchair)?: A Little Help needed standing up from a chair using your arms (e.g., wheelchair or bedside chair)?: A Little Help needed to walk in hospital room?: A Little Help needed climbing 3-5 steps with a railing? : A Little 6 Click Score: 19    End of Session   Activity Tolerance: Patient tolerated treatment well;Patient limited by fatigue Patient left: with call bell/phone within reach;Other (comment) (left on commode in bathroom with nurse call string, LPN notified) Nurse Communication: Mobility status PT Visit Diagnosis:  Unsteadiness on feet (R26.81);Other abnormalities of gait and mobility (R26.89);History of falling (Z91.81)     Time: 1002-1030 PT Time Calculation (min) (ACUTE ONLY): 28 min  Charges:  $Therapeutic Activity: 23-37 mins                     Zigmund Gottron, SPT

## 2021-12-07 NOTE — Progress Notes (Signed)
1 Day Post-Op  Subjective: Patient denies any incisional pain.  Objective: Vital signs in last 24 hours: Temp:  [98 F (36.7 C)-98.5 F (36.9 C)] 98.5 F (36.9 C) (10/24 0517) Pulse Rate:  [96-110] 110 (10/24 0517) Resp:  [15-24] 15 (10/24 0517) BP: (113-157)/(56-80) 145/73 (10/24 0517) SpO2:  [93 %-100 %] 98 % (10/24 0517) Weight:  [95.4 kg] 95.4 kg (10/23 1040) Last BM Date : 12/06/21  Intake/Output from previous day: 10/23 0701 - 10/24 0700 In: 2664.2 [P.O.:720; I.V.:1823.7; IV Piggyback:120.5] Out: 2305 [Urine:2300; Blood:5] Intake/Output this shift: No intake/output data recorded.  General appearance: alert, cooperative, and no distress Extremities: Left great toe amputation site healing well and well approximated.  Lab Results:  Recent Labs    12/06/21 0519 12/07/21 0530  WBC 10.0 14.2*  HGB 9.7* 10.2*  HCT 29.8* 31.4*  PLT 450* 462*   BMET Recent Labs    12/07/21 0530  NA 134*  K 4.6  CL 100  CO2 26  GLUCOSE 185*  BUN 28*  CREATININE 1.67*  CALCIUM 8.5*   PT/INR No results for input(s): "LABPROT", "INR" in the last 72 hours.  Studies/Results: No results found.  Anti-infectives: Anti-infectives (From admission, onward)    Start     Dose/Rate Route Frequency Ordered Stop   12/05/21 1745  Ampicillin-Sulbactam (UNASYN) 3 g in sodium chloride 0.9 % 100 mL IVPB  Status:  Discontinued        3 g 200 mL/hr over 30 Minutes Intravenous Every 6 hours 12/05/21 1647 12/05/21 1711   12/05/21 1730  piperacillin-tazobactam (ZOSYN) IVPB 3.375 g        3.375 g 12.5 mL/hr over 240 Minutes Intravenous Every 8 hours 12/05/21 1711     12/05/21 1515  amoxicillin (AMOXIL) capsule 500 mg        500 mg Oral  Once 12/05/21 1427 12/05/21 1513   12/05/21 1115  metroNIDAZOLE (FLAGYL) tablet 500 mg  Status:  Discontinued        500 mg Oral Every 12 hours 12/05/21 1025 12/05/21 1647   12/01/21 2000  cefTRIAXone (ROCEPHIN) 2 g in sodium chloride 0.9 % 100 mL IVPB  Status:   Discontinued        2 g 200 mL/hr over 30 Minutes Intravenous Every 24 hours 12/01/21 1013 12/05/21 1647   11/30/21 2200  cefTRIAXone (ROCEPHIN) 2 g in sodium chloride 0.9 % 100 mL IVPB  Status:  Discontinued        2 g 200 mL/hr over 30 Minutes Intravenous Every 24 hours 11/29/21 2349 11/30/21 0751   11/30/21 2100  vancomycin (VANCOREADY) IVPB 1500 mg/300 mL  Status:  Discontinued        1,500 mg 150 mL/hr over 120 Minutes Intravenous Every 24 hours 11/29/21 2022 12/01/21 1004   11/30/21 1000  ceFEPIme (MAXIPIME) 2 g in sodium chloride 0.9 % 100 mL IVPB  Status:  Discontinued        2 g 200 mL/hr over 30 Minutes Intravenous Every 12 hours 11/29/21 2022 12/01/21 1004   11/30/21 0800  metroNIDAZOLE (FLAGYL) IVPB 500 mg  Status:  Discontinued        500 mg 100 mL/hr over 60 Minutes Intravenous Every 12 hours 11/30/21 0752 12/01/21 1004   11/29/21 1945  ceFEPIme (MAXIPIME) 2 g in sodium chloride 0.9 % 100 mL IVPB        2 g 200 mL/hr over 30 Minutes Intravenous  Once 11/29/21 1932 11/29/21 2116   11/29/21 1945  vancomycin (VANCOREADY)  IVPB 2000 mg/400 mL        2,000 mg 200 mL/hr over 120 Minutes Intravenous  Once 11/29/21 1932 11/30/21 0538   11/29/21 1930  aztreonam (AZACTAM) 2 g in sodium chloride 0.9 % 100 mL IVPB  Status:  Discontinued        2 g 200 mL/hr over 30 Minutes Intravenous  Once 11/29/21 1926 11/29/21 1931   11/29/21 1930  metroNIDAZOLE (FLAGYL) IVPB 500 mg        500 mg 100 mL/hr over 60 Minutes Intravenous  Once 11/29/21 1926 11/29/21 2131   11/29/21 1930  vancomycin (VANCOCIN) IVPB 1000 mg/200 mL premix  Status:  Discontinued        1,000 mg 200 mL/hr over 60 Minutes Intravenous  Once 11/29/21 1926 11/29/21 1930       Assessment/Plan: s/p Procedure(s): CLOSURE OF LEFT GREAT TOE AMPUTATION Impression: Stable on postoperative day 1, status post closure of left great toe amputation site. Plan: Patient will be okay for discharge in the next 24 to 48 hours from the  general surgery standpoint.  His sutures will remain in place for approximately 2 to 3 weeks.  I will defer antibiotic care to the hospitalist.  I will reassess the dressing in the morning.  LOS: 8 days    Aviva Signs 12/07/2021

## 2021-12-07 NOTE — Care Management Important Message (Signed)
Important Message  Patient Details  Name: [name] MRN: [id number] Date of Birth: 03-31-1950   Medicare Important Message Given:  Yes     Tommy Medal 12/07/2021, 12:48 PM

## 2021-12-08 LAB — GLUCOSE, CAPILLARY
Glucose-Capillary: 208 mg/dL — ABNORMAL HIGH (ref 70–99)
Glucose-Capillary: 148 mg/dL — ABNORMAL HIGH (ref 70–99)

## 2021-12-08 MED ORDER — FLORANEX PO PACK: 1.0000 g | PACK | Freq: Two times a day (BID) | ORAL | 0 refills | Status: AC

## 2021-12-08 MED ORDER — SULFAMETHOXAZOLE-TRIMETHOPRIM 800-160 MG PO TABS: 1.0000 | ORAL_TABLET | Freq: Two times a day (BID) | ORAL | 0 refills | Status: AC

## 2021-12-08 MED ORDER — AMOXICILLIN-POT CLAVULANATE 875-125 MG PO TABS: 1.0000 | ORAL_TABLET | Freq: Two times a day (BID) | ORAL | 0 refills | Status: AC

## 2021-12-08 MED ORDER — OXYCODONE HCL 5 MG PO TABS: 5.0000 mg | ORAL_TABLET | Freq: Four times a day (QID) | ORAL | 0 refills | Status: AC | PRN

## 2021-12-08 NOTE — TOC Transition Note (Signed)
Transition of Care [facility]) - CM/SW Discharge Note   Patient Details  Name: [name] MRN: [id number] Date of Birth: [birth year]  [facility] CM/SW Contact:  Iona Beard, [place] Phone Number: 12/08/2021, 10:51 AM   Clinical Narrative:    CSW updated RN that pts rolling walker is in room. CSW explained that RW needs to go with pt when he is discharged. TOC signing off.   Final next level of care: Home/Self Care Barriers to Discharge: Barriers Resolved   Patient Goals and CMS Choice Patient states their goals for this hospitalization and ongoing recovery are:: return home CMS Medicare.gov Compare Post Acute Care list provided to:: Patient Choice offered to / list presented to : Patient  Discharge Placement                       Discharge Plan and Services In-house Referral: Clinical Social Work Discharge Planning Services: CM Consult Post Acute Care Choice: Durable Medical Equipment, Home Health          DME Arranged: Walker rolling DME Agency: [employer] Date DME Agency Contacted: 12/08/21   Representative spoke with at DME Agency: [place] ([place]) Interventions     Readmission Risk Interventions    11/30/2021    2:51 PM  Readmission Risk Prevention Plan  Transportation Screening Complete  HRI or [place] Complete  Social Work Consult for [place] Planning/Counseling Complete  Palliative Care Screening Not Applicable  Medication Review [employer]) Complete

## 2021-12-08 NOTE — Discharge Summary (Signed)
Physician Discharge Summary   Patient: [name] MRN: [id number] DOB: 04/06/1950  Admit date:     11/29/2021  Discharge date: 12/08/21  Discharge Physician: Barton Dubois   PCP: Sharilyn Sites, MD   Recommendations at discharge:  Repeat basic metabolic panel to follow electrolytes and renal function Continue to follow CBG/A1c with further adjustment to hypoglycemia regimen as required. Make sure patient has follow-up with general surgery as instructed.  Discharge Diagnoses: Principal Problem:   Sepsis ([place]) Active Problems:   Bedbug bite with infection   Hypertension   Anxiety   DM (diabetes mellitus) ([place])   Osteomyelitis ([place])   Amputated great toe of right foot ([place])   Stage 3a chronic kidney disease ([place])   AKI (acute kidney injury) ([place])   Hyperlipidemia   Osteomyelitis of great toe of left foot [facility])  Hospital Course: [name] is a 71 y.o. male with medical history significant of cognitive impairment, anxiety, diabetes mellitus type 2, GERD, hyperlipidemia, hypertension, osteomyelitis with right great toe amputation, renal insufficiency, and more presents the ED with a chief complaint of feeling hot and vomiting.  Patient reports he laid down around 5 PM.  He woke up from his nap and he was diaphoretic, flushed, and nauseous.  He reports 1 episode of vomiting.  It was nonbloody.  Patient reports he checked his sugar and it was 300.  He reports that his sugar is usually between 100 and 200.  Patient reports he takes 20 units of insulin at home.  He reports that he has had polyuria, and polydipsia today.  He denies any polyphasia.  He reports he has had a fever at home.  Tylenol has helped it.  He has had a headache and is felt dizzy.  He reports the headache is feeling "swimmy headed."  Patient reports that he had swelling in his left great toe for 2 or 3 months.  He reports that Sunday he had purulent drainage.  He had no trauma to the toe.  He does not have any pain  in that toe.  Patient has no other complaints at this time.   Patient does not smoke, does not drink, does not use illicit drugs.  Patient is vaccinated for COVID.  Patient is full code.  ED Temp 98.8-101.1, heart rate 101-20 35, respiratory rate 16-27, blood pressure 116/56-148/69, satting 95-97% Leukocytosis 18.3, hemoglobin stable at 9.0, platelets 346 Chemistry reveals a creatinine that is at his baseline 1.77, hyperglycemia at 252 Negative COVID Albumin is low at 2.6 Blood cultures pending Chest x-ray negative Left foot x-ray shows possible osteomyelitis Cefepime, vancomycin and Flagyl started EKG shows sinus tach with a heart rate of 134, QTc 493 Admission requested for SIRS secondary to osteomyelitis  Assessment and Plan: Sepsis, POA, secondary to Osteomyelitis of Left Great Toe status post amputation 10/19 Patient remains afebrile and without leukocytosis Patient underwent left great toe amputation 10/19 and is scheduled for wound closure today. During initial procedure, purulent drainage was noted upon entering the soft tissue so aerobic/anaerobic gram stain and cultures were taken. Gram stain showed abundant WBC, abundant gram negative rods, and few gram positive cocci in pairs and chains. Wound culture grew Enterococcus faecalis, Strep. Mitis/oralis, Proteus, and Prevotella. Patient had been receiving Rocephin prior to wound culture results. ID consulted and patient was receiving Zosyn for broad coverage of above.  -Following ID recommendations plan is to treat with Augmentin for 1 more week. -Hopefully home at discharge tomorrow.   Pharyngitis -Treated on  resolved   DM (diabetes mellitus) ([place]) -A1C from 9/23 is 6.8.  -Continue modified carbohydrate diet and resume home hypoglycemic regimen   Hypoalbuminemia -Albumin is 2.1  -Patient encouraged to take feeding supplements/Glucerna 3 times a day -Adequate nutrition and hydration discussed with patient.    Hyperlipidemia - Continue Lipitor - Heart healthy diet encouraged.   AKI on CKD Stage 3a -Resolved after fluid resuscitation. -Maintain adequate hydration -Continue minimizing nephrotoxic agents. -Repeat basic metabolic panel at follow-up visit to assess electrolytes and renal function trend.   Anxiety -Continue Zoloft and Bupropion -Mood overall stable.   Hypertension -Currently stable -Continue current antihypertensive regimen. -Heart healthy diet discussed with patient.   Bedbug infection -treated and Resolved  Consultants: General surgery and infectious disease. Procedures performed: See below for x-ray report; r Disposition: Home Diet recommendation: Heart healthy modified carbohydrate diet  DISCHARGE MEDICATION: Allergies as of 12/08/2021       Reactions   Ivp Dye [iodinated Contrast Media]    Sped heart reate up    Tetanus Toxoids Rash        Medication List     STOP taking these medications    mupirocin ointment 2 % Commonly known as: BACTROBAN   NIFEdipine 90 MG 24 hr tablet Commonly known as: PROCARDIA XL/NIFEDICAL-XL       TAKE these medications    acetaminophen 500 MG tablet Commonly known as: TYLENOL Take 500-1,000 mg by mouth every 6 (six) hours as needed for mild pain or headache. Pain   amoxicillin-clavulanate 875-125 MG tablet Commonly known as: AUGMENTIN Take 1 tablet by mouth every 12 (twelve) hours for 7 days.   atorvastatin 10 MG tablet Commonly known as: LIPITOR Take 10 mg by mouth at bedtime.   B-complex with vitamin C tablet Take 1 tablet by mouth daily.   buPROPion 150 MG 24 hr tablet Commonly known as: WELLBUTRIN XL Take 150 mg by mouth at bedtime.   cetirizine 10 MG tablet Commonly known as: ZYRTEC Take 10 mg by mouth daily as needed for allergies.   ferrous sulfate 325 (65 FE) MG EC tablet Take 325 mg by mouth daily with breakfast.   Fish Oil 1000 MG Caps Take 1,000 mg by mouth 2 (two) times daily.  Afternoon & bedtime.   lactobacillus Pack Take 1 packet (1 g total) by mouth in the morning and at bedtime for 15 days.   loperamide 2 MG capsule Commonly known as: IMODIUM TAKE ONE CAPSULE BY MOUTH TWO TIMES DAILY AS NEEDED FOR DIARRHEA OR LOOSE STOOLS What changed: See the new instructions.   omeprazole 20 MG capsule Commonly known as: PRILOSEC Take 1 capsule (20 mg total) by mouth daily.   oxyCODONE 5 MG immediate release tablet Commonly known as: Oxy IR/ROXICODONE Take 1-2 tablets (5-10 mg total) by mouth every 6 (six) hours as needed for up to 7 days for severe pain.   pioglitazone 30 MG tablet Commonly known as: ACTOS Take 30 mg by mouth daily.   sertraline 100 MG tablet Commonly known as: ZOLOFT Take 100 mg by mouth at bedtime.   sodium bicarbonate 650 MG tablet Take 650 mg by mouth 2 (two) times daily.   sulfamethoxazole-trimethoprim 800-160 MG tablet Commonly known as: BACTRIM DS Take 1 tablet by mouth every 12 (twelve) hours for 7 days.   Toujeo Max SoloStar 300 UNIT/ML Solostar Pen Generic drug: insulin glargine (2 Unit Dial) Inject 10 Units into the skin daily. What changed: how much to take   Uloric 80 MG  Tabs Generic drug: Febuxostat Take 80 mg by mouth daily.   Vitamin D (Ergocalciferol) 1.25 MG (50000 UNIT) Caps capsule Commonly known as: DRISDOL Take 50,000 Units by mouth once a week. 'Sundays   Vitamin D 50 MCG (2000 UT) Caps Take 2,000 Units by mouth daily.               Durable Medical Equipment  (From admission, onward)           Start     Ordered   12/03/21 1133  For home use only DME Walker rolling  Once       Question Answer Comment  Walker: With 5 Inch Wheels   Patient needs a walker to treat with the following condition Abnormality of gait and mobility      10'$ /20/23 1133              Discharge Care Instructions  (From admission, onward)           Start     Ordered   12/08/21 0000  Discharge wound care:        Comments: Keep area clean and dry; wash daily with soap and water and apply dressing. Ace Wrap as instructed by general surgery as instructed.   12/08/21 1518            Follow-up Information     Aviva Signs, MD. Schedule an appointment as soon as possible for a visit on 12/16/2021.   Specialty: General Surgery Contact information: [address] [place] [address] [phone]         Sharilyn Sites, MD. Schedule an appointment as soon as possible for a visit in 10 day(s).   Specialty: Family Medicine Contact information: [address] [place] [address] [phone]                Discharge Exam: Danley Danker Weights   11/29/21 1907 11/30/21 0908 12/06/21 1040  Weight: 95.7 kg 95.4 kg 95.4 kg   General exam: Alert, awake, oriented x 3; no fever, no chest pain, no nausea or vomiting. Respiratory system: Clear to auscultation. Respiratory effort normal.  Good saturation on room air. Cardiovascular system:RRR. No murmurs, rubs, gallops. Gastrointestinal system: Abdomen is nondistended, soft and nontender. No organomegaly or masses felt. Normal bowel sounds heard. Central nervous system: Alert and oriented. No focal neurological deficits. Extremities: No cyanosis or clubbing; left fluid with clean dressings after great toe amputation.  Reports minimal pain. Skin: No petechiae. Psychiatry: Judgement and insight appear normal. Mood & affect appropriate.   Condition at discharge: Improved and stable.  The results of significant diagnostics from this hospitalization (including imaging, microbiology, ancillary and laboratory) are listed below for reference.   Imaging Studies: US ARTERIAL ABI (SCREENING LOWER EXTREMITY)  Result Date: 12/01/2021 CLINICAL DATA:  Osteomyelitis EXAM: NONINVASIVE PHYSIOLOGIC VASCULAR STUDY OF BILATERAL LOWER EXTREMITIES TECHNIQUE: Evaluation of both lower extremities were performed at rest, including calculation of  ankle-brachial indices with single level Doppler, pressure and pulse volume recording. COMPARISON:  None Available. FINDINGS: Right ABI:  1.37 Left ABI:  1.26 Right Lower Extremity:  Normal arterial waveforms at the ankle. Left Lower Extremity:  Normal arterial waveforms at the ankle. 1.0-1.4 Normal IMPRESSION: Normal bilateral lower extremity ABIs and arterial waveforms at the ankles. Electronically Signed   By: Albin Felling M.D.   On: 12/01/2021 13:11   MR FOOT LEFT W WO CONTRAST  Result Date: 11/30/2021 CLINICAL DATA:  Great toe osteomyelitis EXAM: MRI OF THE LEFT FOREFOOT WITHOUT AND  WITH CONTRAST TECHNIQUE: Multiplanar, multisequence MR imaging of the left forefoot was performed both before and after administration of intravenous contrast. CONTRAST:  79m GADAVIST GADOBUTROL 1 MMOL/ML IV SOLN COMPARISON:  X-ray 11/29/2021, 11/11/2021 FINDINGS: Bones/Joint/Cartilage Destruction/fragmentation of the great toe distal phalanx. Bone marrow edema and patchy low T1 marrow signal changes throughout the proximal phalanx of the great toe. Findings are compatible with acute osteomyelitis. Large complex effusion of the great toe IP joint compatible with septic arthritis. Trace first MTP joint fluid may be reactive. No additional sites of osteomyelitis are identified. There is arthropathy within the midfoot with reactive subchondral marrow signal changes most notably in the lateral cuneiform and navicular. No malalignment. Ligaments Intact Lisfranc ligament. No evidence of acute collateral ligament injury. Muscles and Tendons Denervation changes of the foot musculature. Extensive tenosynovitis of the extensor hallucis longus tendon from the great toe and tracking proximally to the level of the ankle joint. Trace tenosynovitis of the flexor hallucis longus tendon. Both the flexor and extensor hallucis longus tendons are ill-defined distally and are likely torn. Soft tissues Complex fluid collection surrounding the  great toe distal phalanx contiguous with the IP joint measuring approximately 3.9 x 1.8 x 2.8 cm. Ulceration or sinus tract extending to the plantar aspect of the great toe distally. There are scattered foci of susceptibility within the collection and adjacent soft tissues compatible with soft tissue gas. There is also gas seen within the extensor hallucis longus tendon sheath. Diffuse soft tissue edema. IMPRESSION: 1. Acute osteomyelitis of the proximal and distal phalanx phalanx of the great toe. Complex effusion of the great toe IP joint compatible with septic arthritis. 2. Complex fluid collection surrounding the great toe distal phalanx contiguous with the IP joint measuring approximately 3.9 x 1.8 x 2.8 cm. 3. Extensive tenosynovitis of the extensor hallucis longus tendon from the great toe and tracking proximally to the level of the ankle joint. Trace tenosynovitis of the flexor hallucis longus tendon. Both the flexor and extensor hallucis longus tendons are ill-defined distally and are likely torn. Electronically Signed   By: NDavina PokeD.O.   On: 11/30/2021 12:29   DG Toe Great Left  Result Date: 11/29/2021 CLINICAL DATA:  Fever; osteomyelitis EXAM: LEFT GREAT TOE COMPARISON:  Radiographs 11/11/2021 FINDINGS: Acute comminuted displaced fracture of the base of the left first distal phalanx with extension into the IP joint. Question additional fracture of the medial head of the first proximal phalanx. Cortical irregularity about the medial aspect of the tuft of the first distal phalanx. The previously seen nondisplaced fracture of the tuft of the first distal phalanx is not well demonstrated though there is ill-defined densities and soft tissue gas about the distal tuft. Soft tissue swelling about the first toe. IMPRESSION: 1. Acute comminuted intra-articular fracture of the left first distal phalanx. 2. Cortical irregularity about the tuft of the first distal phalanx with adjacent soft tissue gas  suggestive of osteomyelitis. Electronically Signed   By: TPlacido SouM.D.   On: 11/29/2021 20:06   DG Chest Port 1 View  Result Date: 11/29/2021 CLINICAL DATA:  Hyperglycemia EXAM: PORTABLE CHEST 1 VIEW COMPARISON:  Chest x-ray 11/22/2021 FINDINGS: The heart size and mediastinal contours are within normal limits. Both lungs are clear. The visualized skeletal structures are unremarkable. IMPRESSION: No active disease. Electronically Signed   By: ARonney AstersM.D.   On: 11/29/2021 20:05   DG Chest Port 1 View  Result Date: 11/22/2021 CLINICAL DATA:  Weakness.  FGolden Circledue  to dizziness.  Recent falls. EXAM: PORTABLE CHEST 1 VIEW COMPARISON:  11/11/2021 FINDINGS: Shallow inspiration. Heart size and pulmonary vascularity are normal. Lungs are clear. No pleural effusions. No pneumothorax. Mediastinal contours appear intact. IMPRESSION: No active disease. Electronically Signed   By: Lucienne Capers M.D.   On: 11/22/2021 01:01   DG Toe Great Left  Result Date: 11/11/2021 CLINICAL DATA:  Osteomyelitis EXAM: LEFT GREAT TOE COMPARISON:  None Available. FINDINGS: There is a minimally displaced anatomically aligned fracture of the distal tuft of the left great toe. No superimposed osseous erosions are identified. No abnormal periosteal reaction. 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 There is extensive soft tissue swelling of the great toe. Vascular calcifications are noted within the visualized left forefoot. IMPRESSION: Minimally displaced anatomically aligned fracture of the distal tuft of the left great toe. Extensive soft tissue swelling of the left great toe. No superimposed osseous erosion. Electronically Signed   By: Fidela Salisbury M.D.   On: 11/11/2021 21:15   DG Chest 2 View  Result Date: 11/11/2021 CLINICAL DATA:  Chest pain EXAM: CHEST - 2 VIEW COMPARISON:  Chest x-ray 07/03/2021 FINDINGS: The heart size and mediastinal contours are within normal  limits. Both lungs are clear. The visualized skeletal structures are unremarkable. IMPRESSION: No active cardiopulmonary disease. Electronically Signed   By: Ronney Asters M.D.   On: 11/11/2021 16:48    Microbiology: Results for orders placed or performed during the hospital encounter of 11/29/21  Resp Panel by RT-PCR (Flu A&B, Covid) Anterior Nasal Swab     Status: None   Collection Time: 11/29/21  7:18 PM   Specimen: Anterior Nasal Swab  Result Value Ref Range Status   SARS Coronavirus 2 by RT PCR NEGATIVE NEGATIVE Final    Comment: (NOTE) SARS-CoV-2 target nucleic acids are NOT DETECTED.  The SARS-CoV-2 RNA is generally detectable in upper respiratory specimens during the acute phase of infection. The lowest concentration of SARS-CoV-2 viral copies this assay can detect is 138 copies/mL. A negative result does not preclude SARS-Cov-2 infection and should not be used as the sole basis for treatment or other patient management decisions. A negative result may occur with  improper specimen collection/handling, submission of specimen other than nasopharyngeal swab, presence of viral mutation(s) within the areas targeted by this assay, and inadequate number of viral copies(<138 copies/mL). A negative result must be combined with clinical observations, patient history, and epidemiological information. The expected result is Negative.  Fact Sheet for Patients:  EntrepreneurPulse.com.au  Fact Sheet for Healthcare Providers:  IncredibleEmployment.be  This test is no t yet approved or cleared by the [place] FDA and  has been authorized for detection and/or diagnosis of SARS-CoV-2 by FDA under an Emergency Use Authorization (EUA). This EUA will remain  in effect (meaning this test can be used) for the duration of the COVID-19 declaration under Section 564(b)(1) of the Act, 21 U.S.C.section 360bbb-3(b)(1), unless the authorization is terminated  or  revoked sooner.       Influenza A by PCR NEGATIVE NEGATIVE Final   Influenza B by PCR NEGATIVE NEGATIVE Final    Comment: (NOTE) The Xpert Xpress SARS-CoV-2/FLU/RSV plus assay is intended as an aid in the diagnosis of influenza from Nasopharyngeal swab specimens and should not be used as a sole basis for treatment. Nasal washings and aspirates are unacceptable for Xpert Xpress SARS-CoV-2/FLU/RSV testing.  Fact Sheet for Patients: EntrepreneurPulse.com.au  Fact Sheet for Healthcare Providers: IncredibleEmployment.be  This test  is not yet approved or cleared by the [place] and has been authorized for detection and/or diagnosis of SARS-CoV-2 by FDA under an Emergency Use Authorization (EUA). This EUA will remain in effect (meaning this test can be used) for the duration of the COVID-19 declaration under Section 564(b)(1) of the Act, 21 U.S.C. section 360bbb-3(b)(1), unless the authorization is terminated or revoked.  Performed at [facility], [address], [place], [place] [address]   Culture, blood (Routine x 2)     Status: None   Collection Time: 11/29/21  7:25 PM   Specimen: BLOOD RIGHT FOREARM  Result Value Ref Range Status   Specimen Description BLOOD RIGHT FOREARM  Final   Special Requests   Final    BOTTLES DRAWN AEROBIC AND ANAEROBIC Blood Culture results may not be optimal due to an excessive volume of blood received in culture bottles   Culture   Final    NO GROWTH 5 DAYS Performed at [facility], [address], [place], [place] [address]    Report Status 12/04/2021 FINAL  Final  Culture, blood (Routine x 2)     Status: Abnormal   Collection Time: 11/29/21  7:30 PM   Specimen: BLOOD RIGHT HAND  Result Value Ref Range Status   Specimen Description   Final    BLOOD RIGHT HAND Performed at [facility], [address] [place], [place] [address]    Special Requests   Final    BOTTLES DRAWN AEROBIC AND ANAEROBIC Blood  Culture results may not be optimal due to an excessive volume of blood received in culture bottles Performed at [facility], [address], [place], [place] [address]    Culture  Setup Time   Final    AEROBIC BOTTLE ONLY GRAM POSITIVE COCCI Gram Stain Report Called to,Read Back By and Verified With: LEIGHANN PARSONS @ 1406 ON 11/30/21 C VARNER CRITICAL RESULT CALLED TO, READ BACK BY AND VERIFIED WITH: PHARMD STEVEN HURTH ON 11/30/21 @ 1658 BY DRT Performed at [facility], [place] [address], [place], [place] [address]    Culture (A)  Final    STREPTOCOCCUS AGALACTIAE STREPTOCOCCUS MITIS/ORALIS    Report Status 12/03/2021 FINAL  Final   Organism ID, Bacteria STREPTOCOCCUS AGALACTIAE  Final   Organism ID, Bacteria STREPTOCOCCUS MITIS/ORALIS  Final      Susceptibility   Streptococcus agalactiae - MIC*    CLINDAMYCIN <=0.25 SENSITIVE Sensitive     AMPICILLIN <=0.25 SENSITIVE Sensitive     ERYTHROMYCIN 2 RESISTANT Resistant     VANCOMYCIN 0.5 SENSITIVE Sensitive     CEFTRIAXONE <=0.12 SENSITIVE Sensitive     LEVOFLOXACIN 0.5 SENSITIVE Sensitive     PENICILLIN <=0.06 SENSITIVE Sensitive     * STREPTOCOCCUS AGALACTIAE   Streptococcus mitis/oralis - MIC*    TETRACYCLINE >=16 RESISTANT Resistant     VANCOMYCIN 0.5 SENSITIVE Sensitive     CLINDAMYCIN >=1 RESISTANT Resistant     PENICILLIN <=0.06      CEFTRIAXONE <=0.12      * STREPTOCOCCUS MITIS/ORALIS  Blood Culture ID Panel (Reflexed)     Status: Abnormal   Collection Time: 11/29/21  7:30 PM  Result Value Ref Range Status   Enterococcus faecalis NOT DETECTED NOT DETECTED Final   Enterococcus Faecium NOT DETECTED NOT DETECTED Final   Listeria monocytogenes NOT DETECTED NOT DETECTED Final   Staphylococcus species NOT DETECTED NOT DETECTED Final   Staphylococcus aureus (BCID) NOT DETECTED NOT DETECTED Final   Staphylococcus epidermidis NOT DETECTED NOT  DETECTED Final   Staphylococcus lugdunensis NOT DETECTED NOT DETECTED Final    Streptococcus species DETECTED (A) NOT DETECTED Final    Comment: CRITICAL RESULT CALLED TO, READ BACK BY AND VERIFIED WITH: PHARMD STEVEN HURTH ON 11/30/21 @ 1658 BY DRT    Streptococcus agalactiae DETECTED (A) NOT DETECTED Final    Comment: CRITICAL RESULT CALLED TO, READ BACK BY AND VERIFIED WITH: PHARMD STEVEN HURTH ON 11/30/21 @ 1658 BY DRT    Streptococcus pneumoniae NOT DETECTED NOT DETECTED Final   Streptococcus pyogenes NOT DETECTED NOT DETECTED Final   A.calcoaceticus-baumannii NOT DETECTED NOT DETECTED Final   Bacteroides fragilis NOT DETECTED NOT DETECTED Final   Enterobacterales NOT DETECTED NOT DETECTED Final   Enterobacter cloacae complex NOT DETECTED NOT DETECTED Final   Escherichia coli NOT DETECTED NOT DETECTED Final   Klebsiella aerogenes NOT DETECTED NOT DETECTED Final   Klebsiella oxytoca NOT DETECTED NOT DETECTED Final   Klebsiella pneumoniae NOT DETECTED NOT DETECTED Final   Proteus species NOT DETECTED NOT DETECTED Final   Salmonella species NOT DETECTED NOT DETECTED Final   Serratia marcescens NOT DETECTED NOT DETECTED Final   Haemophilus influenzae NOT DETECTED NOT DETECTED Final   Neisseria meningitidis NOT DETECTED NOT DETECTED Final   Pseudomonas aeruginosa NOT DETECTED NOT DETECTED Final   Stenotrophomonas maltophilia NOT DETECTED NOT DETECTED Final   Candida albicans NOT DETECTED NOT DETECTED Final   Candida auris NOT DETECTED NOT DETECTED Final   Candida glabrata NOT DETECTED NOT DETECTED Final   Candida krusei NOT DETECTED NOT DETECTED Final   Candida parapsilosis NOT DETECTED NOT DETECTED Final   Candida tropicalis NOT DETECTED NOT DETECTED Final   Cryptococcus neoformans/gattii NOT DETECTED NOT DETECTED Final    Comment: Performed at [facility], [address] [place], [place] [address]  Culture, blood (Routine X 2) w Reflex to ID Panel     Status: None   Collection Time: 12/01/21  7:45 AM   Specimen: Right Antecubital; Blood  Result  Value Ref Range Status   Specimen Description RIGHT ANTECUBITAL  Final   Special Requests   Final    BOTTLES DRAWN AEROBIC AND ANAEROBIC Blood Culture results may not be optimal due to an excessive volume of blood received in culture bottles   Culture   Final    NO GROWTH 5 DAYS Performed at [facility], [address], [place], [place] [address]    Report Status 12/06/2021 FINAL  Final  Culture, blood (Routine X 2) w Reflex to ID Panel     Status: None   Collection Time: 12/01/21  7:51 AM   Specimen: BLOOD LEFT HAND  Result Value Ref Range Status   Specimen Description BLOOD LEFT HAND  Final   Special Requests   Final    BOTTLES DRAWN AEROBIC AND ANAEROBIC Blood Culture results may not be optimal due to an excessive volume of blood received in culture bottles   Culture   Final    NO GROWTH 5 DAYS Performed at [facility], [address], [place], [place] [address]    Report Status 12/06/2021 FINAL  Final  MRSA Next Gen by PCR, Nasal     Status: None   Collection Time: 12/02/21 10:30 AM   Specimen: Nasal Mucosa; Nasal Swab  Result Value Ref Range Status   MRSA by PCR Next Gen NOT DETECTED NOT DETECTED Final    Comment: (NOTE) The GeneXpert MRSA Assay (FDA approved for NASAL specimens only), is one component of a comprehensive  MRSA colonization surveillance program. It is not intended to diagnose MRSA infection nor to guide or monitor treatment for MRSA infections. Test performance is not FDA approved in patients less than 64 years old. Performed at [facility], [address], [place], [place] [address]   Aerobic/Anaerobic Culture w Gram Stain (surgical/deep wound)     Status: None   Collection Time: 12/02/21 12:31 PM   Specimen: Wound  Result Value Ref Range Status   Specimen Description   Final    WOUND Performed at [facility], [address], [place], [place] [address]    Special Requests   Final    NONE Performed at [facility], [address] [place], [place]  [address]    Gram Stain   Final    ABUNDANT WBC PRESENT,BOTH PMN AND MONONUCLEAR ABUNDANT GRAM NEGATIVE RODS FEW GRAM POSITIVE COCCI IN PAIRS IN CHAINS    Culture   Final    MODERATE ENTEROCOCCUS FAECALIS MODERATE STREPTOCOCCUS MITIS/ORALIS RARE PROTEUS PENNERI MODERATE PREVOTELLA BIVIA BETA LACTAMASE POSITIVE Performed at [facility], [place] [address], [place], [place] [address]    Report Status 12/05/2021 FINAL  Final   Organism ID, Bacteria ENTEROCOCCUS FAECALIS  Final   Organism ID, Bacteria STREPTOCOCCUS MITIS/ORALIS  Final   Organism ID, Bacteria PROTEUS PENNERI  Final      Susceptibility   Enterococcus faecalis - MIC*    AMPICILLIN <=2 SENSITIVE Sensitive     VANCOMYCIN 1 SENSITIVE Sensitive     GENTAMICIN SYNERGY SENSITIVE Sensitive     * MODERATE ENTEROCOCCUS FAECALIS   Proteus penneri - MIC*    AMPICILLIN >=32 RESISTANT Resistant     CEFAZOLIN >=64 RESISTANT Resistant     CEFEPIME 16 RESISTANT Resistant     CEFTAZIDIME <=1 SENSITIVE Sensitive     CEFTRIAXONE 32 RESISTANT Resistant     CIPROFLOXACIN <=0.25 SENSITIVE Sensitive     GENTAMICIN <=1 SENSITIVE Sensitive     IMIPENEM 4 SENSITIVE Sensitive     TRIMETH/SULFA <=20 SENSITIVE Sensitive     AMPICILLIN/SULBACTAM 16 INTERMEDIATE Intermediate     PIP/TAZO <=4 SENSITIVE Sensitive     * RARE PROTEUS PENNERI   Streptococcus mitis/oralis - MIC*    TETRACYCLINE >=16 RESISTANT Resistant     VANCOMYCIN 0.5 SENSITIVE Sensitive     CLINDAMYCIN >=1 RESISTANT Resistant     * MODERATE STREPTOCOCCUS MITIS/ORALIS  Surgical PCR screen     Status: None   Collection Time: 12/06/21  4:57 AM   Specimen: Nasal Mucosa; Nasal Swab  Result Value Ref Range Status   MRSA, PCR NEGATIVE NEGATIVE Final   Staphylococcus aureus NEGATIVE NEGATIVE Final    Comment: (NOTE) The Xpert SA Assay (FDA approved for NASAL specimens in patients 4 years of age and older), is one component of a comprehensive surveillance program. It is not  intended to diagnose infection nor to guide or monitor treatment. Performed at [facility], [address], [place],  [address]     Labs: CBC: Recent Labs  Lab 12/03/21 0520 12/04/21 0537 12/05/21 0522 12/06/21 0519 12/07/21 0530  WBC 15.6* 11.3* 9.8 10.0 14.2*  HGB 8.0* 8.8* 9.6* 9.7* 10.2*  HCT 24.3* 27.3* 29.3* 29.8* 31.4*  MCV 85.9 85.3 84.9 85.4 86.5  PLT 386 428* 440* 450* 003*   Basic Metabolic Panel: Recent Labs  Lab 12/02/21 0518 12/03/21 0520 12/07/21 0530  NA 131* 135 134*  K 3.8 4.0 4.6  CL 97* 96* 100  CO2 '28 29 26  '$ GLUCOSE 141*  236* 185*  BUN 15 19 28*  CREATININE 1.35* 1.28* 1.67*  CALCIUM 8.0* 8.2* 8.5*  MG  --  2.0 2.2   Liver Function Tests: Recent Labs  Lab 12/02/21 0518 12/03/21 0520  AST 13* 21  ALT 15 19  ALKPHOS 73 77  BILITOT 0.7 0.4  PROT 5.6* 5.8*  ALBUMIN 2.0* 2.1*   CBG: Recent Labs  Lab 12/06/21 1404 12/06/21 1623 12/07/21 1629 12/07/21 2127 12/08/21 1130  GLUCAP 157* 241* 134* 177* 208*    Discharge time spent: greater than 30 minutes.  Signed: Barton Dubois, MD [facility] 12/08/2021

## 2021-12-08 NOTE — Progress Notes (Signed)
Physical Therapy Treatment Patient Details Name: [name] MRN: [id number] DOB: Jun 25, 1950 Today's Date: 12/08/2021   History of Present Illness [name] is a 71 y.o. male with medical history significant of cognitive impairment, anxiety, diabetes mellitus type 2, GERD, hyperlipidemia, hypertension, osteomyelitis with right great toe amputation, renal insufficiency. Admitted 10/16 with sepsis. 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    PT Comments    Good progress ambulating in hallway with RW, 95 feet, no evidence of LOB, supervision for safety. Encouraged WB through heel only to allow for healing and protection of amputation site. Requested RN try to order forefoot unloading shoe or post-op shoe for protection as appropriate. Pt eager to return home today. Will continue to follow until d/c. Reviewed precautions and safety.    Recommendations for follow up therapy are one component of a multi-disciplinary discharge planning process, led by the attending physician.  Recommendations may be updated based on patient status, additional functional criteria and insurance authorization.  Follow Up Recommendations  Home health PT     Assistance Recommended at Discharge Intermittent Supervision/Assistance  Patient can return home with the following A little help with walking and/or transfers;A little help with bathing/dressing/bathroom;Assistance with cooking/housework;Help with stairs or ramp for entrance   Equipment Recommendations  Rolling walker (2 wheels);Other (comment) (Forefoot offloading shoe, or post-op shoe; per surgical recs to protect amputation site.)    Recommendations for Other Services       Precautions / Restrictions Precautions Precautions: Fall Restrictions Weight Bearing Restrictions: No     Mobility  Bed Mobility Overal bed mobility: Modified Independent Bed Mobility: Supine to Sit     Supine to sit: Modified independent (Device/Increase time)      General bed mobility comments: No assist needed.    Transfers Overall transfer level: Needs assistance Equipment used: Rolling walker (2 wheels), None Transfers: Sit to/from Stand Sit to Stand: Supervision           General transfer comment: supervision for safety, RW set-up. Good power up to stand and stable with use of RW for support.    Ambulation/Gait Ambulation/Gait assistance: Supervision Gait Distance (Feet): 95 Feet Assistive device: Rolling walker (2 wheels) Gait Pattern/deviations: Decreased stride length, Decreased step length - left, Decreased step length - right, Decreased stance time - left Gait velocity: decreased Gait velocity interpretation: <1.8 ft/sec, indicate of risk for recurrent falls   General Gait Details: Educated on as AD use with RW, showing some reduced awareness of proximity to items but able to self correct. Cues to maintain weight through heel. No evidence of LOB while using RW for support.   Stairs             Wheelchair Mobility    Modified Rankin (Stroke Patients Only)       Balance Overall balance assessment: Needs assistance Sitting-balance support: No upper extremity supported, Feet supported Sitting balance-Leahy Scale: Good Sitting balance - Comments: seated EOB   Standing balance support: During functional activity, No upper extremity supported Standing balance-Leahy Scale: Good                              Cognition Arousal/Alertness: Awake/alert Behavior During Therapy: WFL for tasks assessed/performed Overall Cognitive Status: History of cognitive impairments - at baseline  Exercises      General Comments General comments (skin integrity, edema, etc.): Educated on elevation of Lt foot with rest, WB through heel.      Pertinent Vitals/Pain Pain Assessment Pain Assessment: 0-10 Pain Score: 4  Pain Location: Lt toe amputation site with  walking Pain Descriptors / Indicators: Aching Pain Intervention(s): Limited activity within patient's tolerance, Monitored during session, Repositioned    Home Living                          Prior Function            PT Goals (current goals can now be found in the care plan section) Acute Rehab PT Goals Patient Stated Goal: Return home PT Goal Formulation: With patient Time For Goal Achievement: 12/14/21 Potential to Achieve Goals: Good Progress towards PT goals: Progressing toward goals    Frequency    Min 3X/week      PT Plan Current plan remains appropriate;Equipment recommendations need to be updated    Co-evaluation              AM-PAC PT "6 Clicks" Mobility   Outcome Measure  Help needed turning from your back to your side while in a flat bed without using bedrails?: None Help needed moving from lying on your back to sitting on the side of a flat bed without using bedrails?: None Help needed moving to and from a bed to a chair (including a wheelchair)?: A Little Help needed standing up from a chair using your arms (e.g., wheelchair or bedside chair)?: A Little Help needed to walk in hospital room?: A Little Help needed climbing 3-5 steps with a railing? : A Little 6 Click Score: 20    End of Session Equipment Utilized During Treatment: Gait belt Activity Tolerance: Patient tolerated treatment well Patient left: with call bell/phone within reach;in chair;with chair alarm set Nurse Communication: Mobility status (Requested order forefoot off-loading shoe or post-op boot.) PT Visit Diagnosis: Unsteadiness on feet (R26.81);Other abnormalities of gait and mobility (R26.89);History of falling (Z91.81)     Time: 1610-9604 PT Time Calculation (min) (ACUTE ONLY): 18 min  Charges:  $Gait Training: 8-22 mins                     Candie Mile, PT, DPT Physical Therapist Acute Rehabilitation Services [place] 12/08/2021, 10:58 AM

## 2021-12-08 NOTE — Progress Notes (Signed)
2 Days Post-Op  Subjective: No complaints  Objective: Vital signs in last 24 hours: Temp:  [98.4 F (36.9 C)-98.7 F (37.1 C)] 98.4 F (36.9 C) (10/25 0543) Pulse Rate:  [90-102] 96 (10/25 0543) Resp:  [18-20] 20 (10/25 0543) BP: (128-181)/(58-68) 128/58 (10/25 0543) SpO2:  [97 %-99 %] 97 % (10/25 0543) Last BM Date : 12/08/21  Intake/Output from previous day: 10/24 0701 - 10/25 0700 In: 2044.5 [P.O.:957; I.V.:1087.5] Out: 2800 [Urine:2800] Intake/Output this shift: No intake/output data recorded.  General appearance: alert, cooperative, and no distress Extremities: Left great toe incision healing well.  Xeroform removed.  Lab Results:  Recent Labs    12/06/21 0519 12/07/21 0530  WBC 10.0 14.2*  HGB 9.7* 10.2*  HCT 29.8* 31.4*  PLT 450* 462*   BMET Recent Labs    12/07/21 0530  NA 134*  K 4.6  CL 100  CO2 26  GLUCOSE 185*  BUN 28*  CREATININE 1.67*  CALCIUM 8.5*   PT/INR No results for input(s): "LABPROT", "INR" in the last 72 hours.  Studies/Results: No results found.  Anti-infectives: Anti-infectives (From admission, onward)    Start     Dose/Rate Route Frequency Ordered Stop   12/07/21 2200  amoxicillin-clavulanate (AUGMENTIN) 875-125 MG per tablet 1 tablet        1 tablet Oral Every 12 hours 12/07/21 1656     12/07/21 2200  sulfamethoxazole-trimethoprim (BACTRIM DS) 800-160 MG per tablet 1 tablet        1 tablet Oral Every 12 hours 12/07/21 1703     12/05/21 1745  Ampicillin-Sulbactam (UNASYN) 3 g in sodium chloride 0.9 % 100 mL IVPB  Status:  Discontinued        3 g 200 mL/hr over 30 Minutes Intravenous Every 6 hours 12/05/21 1647 12/05/21 1711   12/05/21 1730  piperacillin-tazobactam (ZOSYN) IVPB 3.375 g  Status:  Discontinued        3.375 g 12.5 mL/hr over 240 Minutes Intravenous Every 8 hours 12/05/21 1711 12/07/21 1656   12/05/21 1515  amoxicillin (AMOXIL) capsule 500 mg        500 mg Oral  Once 12/05/21 1427 12/05/21 1513   12/05/21  1115  metroNIDAZOLE (FLAGYL) tablet 500 mg  Status:  Discontinued        500 mg Oral Every 12 hours 12/05/21 1025 12/05/21 1647   12/01/21 2000  cefTRIAXone (ROCEPHIN) 2 g in sodium chloride 0.9 % 100 mL IVPB  Status:  Discontinued        2 g 200 mL/hr over 30 Minutes Intravenous Every 24 hours 12/01/21 1013 12/05/21 1647   11/30/21 2200  cefTRIAXone (ROCEPHIN) 2 g in sodium chloride 0.9 % 100 mL IVPB  Status:  Discontinued        2 g 200 mL/hr over 30 Minutes Intravenous Every 24 hours 11/29/21 2349 11/30/21 0751   11/30/21 2100  vancomycin (VANCOREADY) IVPB 1500 mg/300 mL  Status:  Discontinued        1,500 mg 150 mL/hr over 120 Minutes Intravenous Every 24 hours 11/29/21 2022 12/01/21 1004   11/30/21 1000  ceFEPIme (MAXIPIME) 2 g in sodium chloride 0.9 % 100 mL IVPB  Status:  Discontinued        2 g 200 mL/hr over 30 Minutes Intravenous Every 12 hours 11/29/21 2022 12/01/21 1004   11/30/21 0800  metroNIDAZOLE (FLAGYL) IVPB 500 mg  Status:  Discontinued        500 mg 100 mL/hr over 60 Minutes Intravenous Every 12  hours 11/30/21 0752 12/01/21 1004   11/29/21 1945  ceFEPIme (MAXIPIME) 2 g in sodium chloride 0.9 % 100 mL IVPB        2 g 200 mL/hr over 30 Minutes Intravenous  Once 11/29/21 1932 11/29/21 2116   11/29/21 1945  vancomycin (VANCOREADY) IVPB 2000 mg/400 mL        2,000 mg 200 mL/hr over 120 Minutes Intravenous  Once 11/29/21 1932 11/30/21 0538   11/29/21 1930  aztreonam (AZACTAM) 2 g in sodium chloride 0.9 % 100 mL IVPB  Status:  Discontinued        2 g 200 mL/hr over 30 Minutes Intravenous  Once 11/29/21 1926 11/29/21 1931   11/29/21 1930  metroNIDAZOLE (FLAGYL) IVPB 500 mg        500 mg 100 mL/hr over 60 Minutes Intravenous  Once 11/29/21 1926 11/29/21 2131   11/29/21 1930  vancomycin (VANCOCIN) IVPB 1000 mg/200 mL premix  Status:  Discontinued        1,000 mg 200 mL/hr over 60 Minutes Intravenous  Once 11/29/21 1926 11/29/21 1930       Assessment/Plan: s/p  Procedure(s): CLOSURE OF LEFT GREAT TOE AMPUTATION Impression: Patient doing well.  Okay for discharge from surgery standpoint.  Patient should keep wound clean and dry daily with soap and water.  He will follow-up in my office for wound check.  Discussed with Dr. Dyann Kief.  LOS: 9 days    Aviva Signs 12/08/2021

## 2021-12-08 NOTE — Plan of Care (Signed)
  Problem: Metabolic: Goal: Ability to maintain appropriate glucose levels will improve Outcome: Not Progressing   Problem: Skin Integrity: Goal: Risk for impaired skin integrity will decrease Outcome: Not Progressing   Problem: Clinical Measurements: Goal: Will remain free from infection Outcome: Not Progressing

## 2021-12-08 NOTE — Progress Notes (Incomplete)
Ng Discharge Note  Admit Date:  11/29/2021 Discharge date: 12/08/2021   [name] to be D/C'd {Discharge Destination:18313::"Home"} per MD order.  AVS completed. Patient/caregiver able to verbalize understanding.  Discharge Medication: Allergies as of 12/08/2021       Reactions   Ivp Dye [iodinated Contrast Media]    Sped heart reate up    Tetanus Toxoids Rash        Medication List     STOP taking these medications    mupirocin ointment 2 % Commonly known as: BACTROBAN   NIFEdipine 90 MG 24 hr tablet Commonly known as: PROCARDIA XL/NIFEDICAL-XL       TAKE these medications    acetaminophen 500 MG tablet Commonly known as: TYLENOL Take 500-1,000 mg by mouth every 6 (six) hours as needed for mild pain or headache. Pain   amoxicillin-clavulanate 875-125 MG tablet Commonly known as: AUGMENTIN Take 1 tablet by mouth every 12 (twelve) hours for 7 days.   atorvastatin 10 MG tablet Commonly known as: LIPITOR Take 10 mg by mouth at bedtime.   B-complex with vitamin C tablet Take 1 tablet by mouth daily.   buPROPion 150 MG 24 hr tablet Commonly known as: WELLBUTRIN XL Take 150 mg by mouth at bedtime.   cetirizine 10 MG tablet Commonly known as: ZYRTEC Take 10 mg by mouth daily as needed for allergies.   ferrous sulfate 325 (65 FE) MG EC tablet Take 325 mg by mouth daily with breakfast.   Fish Oil 1000 MG Caps Take 1,000 mg by mouth 2 (two) times daily. Afternoon & bedtime.   lactobacillus Pack Take 1 packet (1 g total) by mouth in the morning and at bedtime for 15 days.   loperamide 2 MG capsule Commonly known as: IMODIUM TAKE ONE CAPSULE BY MOUTH TWO TIMES DAILY AS NEEDED FOR DIARRHEA OR LOOSE STOOLS What changed: See the new instructions.   omeprazole 20 MG capsule Commonly known as: PRILOSEC Take 1 capsule (20 mg total) by mouth daily.   oxyCODONE 5 MG immediate release tablet Commonly known as: Oxy IR/ROXICODONE Take 1-2 tablets (5-10 mg  total) by mouth every 6 (six) hours as needed for up to 7 days for severe pain.   pioglitazone 30 MG tablet Commonly known as: ACTOS Take 30 mg by mouth daily.   sertraline 100 MG tablet Commonly known as: ZOLOFT Take 100 mg by mouth at bedtime.   sodium bicarbonate 650 MG tablet Take 650 mg by mouth 2 (two) times daily.   sulfamethoxazole-trimethoprim 800-160 MG tablet Commonly known as: BACTRIM DS Take 1 tablet by mouth every 12 (twelve) hours for 7 days.   Toujeo Max SoloStar 300 UNIT/ML Solostar Pen Generic drug: insulin glargine (2 Unit Dial) Inject 10 Units into the skin daily. What changed: how much to take   Uloric 80 MG Tabs Generic drug: Febuxostat Take 80 mg by mouth daily.   Vitamin D (Ergocalciferol) 1.25 MG (50000 UNIT) Caps capsule Commonly known as: DRISDOL Take 50,000 Units by mouth once a week. Sundays   Vitamin D 50 MCG (2000 UT) Caps Take 2,000 Units by mouth daily.               Durable Medical Equipment  (From admission, onward)           Start     Ordered   12/03/21 1133  For home use only DME Walker rolling  Once       Question Answer Comment  Walker: With 5 Inch  Wheels   Patient needs a walker to treat with the following condition Abnormality of gait and mobility      12/03/21 1133              Discharge Care Instructions  (From admission, onward)           Start     Ordered   12/08/21 0000  Discharge wound care:       Comments: Keep area clean and dry; wash daily with soap and water and apply dressing. Ace Wrap as instructed by general surgery as instructed.   12/08/21 1518            Discharge Assessment: Vitals:   12/08/21 1356 12/08/21 1656  BP: (!) 149/77   Pulse: (!) 105   Resp: 19   Temp: 99 F (37.2 C)   SpO2: 96% 97%   Skin clean, dry and intact without evidence of skin break down, no evidence of skin tears noted. IV catheter discontinued intact. Site without signs and symptoms of  complications - no redness or edema noted at insertion site, patient denies c/o pain - only slight tenderness at site.  Dressing with slight pressure applied.  D/c Instructions-Education: Discharge instructions given to patient/family with verbalized understanding. D/c education completed with patient/family including follow up instructions, medication list, d/c activities limitations if indicated, with other d/c instructions as indicated by MD - patient able to verbalize understanding, all questions fully answered. Patient instructed to return to ED, call 911, or call MD for any changes in condition.  Patient escorted via [place], and D/C home via private auto.  Richrd Prime, LPN 57/97/2820 6:01 PM

## 2021-12-08 NOTE — Anesthesia Postprocedure Evaluation (Signed)
Anesthesia Post Note  Patient: [name]  Procedure(s) Performed: CLOSURE OF LEFT GREAT TOE AMPUTATION (Left: Toe)  Patient location during evaluation: Phase II Anesthesia Type: General Level of consciousness: awake Pain management: pain level controlled Vital Signs Assessment: post-procedure vital signs reviewed and stable Respiratory status: spontaneous breathing and respiratory function stable Cardiovascular status: blood pressure returned to baseline and stable Postop Assessment: no headache and no apparent nausea or vomiting Anesthetic complications: no Comments: Late entry   No notable events documented.   Last Vitals:  Vitals:   12/07/21 2131 12/08/21 0543  BP: 132/62 (!) 128/58  Pulse: 90 96  Resp:  20  Temp:  36.9 C  SpO2:  97%    Last Pain:  Vitals:   12/08/21 1007  TempSrc:   PainSc: 0-No pain                 Louann Sjogren

## 2021-12-09 ENCOUNTER — Encounter (HOSPITAL_COMMUNITY): Payer: Self-pay | Admitting: General Surgery

## 2021-12-09 ENCOUNTER — Other Ambulatory Visit: Payer: Self-pay

## 2021-12-09 DIAGNOSIS — N183 Chronic kidney disease, stage 3 unspecified: Secondary | ICD-10-CM

## 2021-12-09 DIAGNOSIS — M869 Osteomyelitis, unspecified: Secondary | ICD-10-CM

## 2021-12-09 DIAGNOSIS — F419 Anxiety disorder, unspecified: Secondary | ICD-10-CM

## 2021-12-09 DIAGNOSIS — N1831 Chronic kidney disease, stage 3a: Secondary | ICD-10-CM

## 2021-12-09 DIAGNOSIS — L089 Local infection of the skin and subcutaneous tissue, unspecified: Secondary | ICD-10-CM

## 2021-12-09 LAB — GLUCOSE, CAPILLARY
Glucose-Capillary: 121 mg/dL — ABNORMAL HIGH (ref 70–99)
Glucose-Capillary: 153 mg/dL — ABNORMAL HIGH (ref 70–99)
Glucose-Capillary: 201 mg/dL — ABNORMAL HIGH (ref 70–99)
Glucose-Capillary: 272 mg/dL — ABNORMAL HIGH (ref 70–99)
Glucose-Capillary: 204 mg/dL — ABNORMAL HIGH (ref 70–99)
Glucose-Capillary: 252 mg/dL — ABNORMAL HIGH (ref 70–99)
Glucose-Capillary: 160 mg/dL — ABNORMAL HIGH (ref 70–99)

## 2021-12-09 LAB — SURGICAL PATHOLOGY

## 2021-12-09 NOTE — Patient Outreach (Signed)
Odis Turck Myler April 19, 1950 [id number]  [facility] [ACO] Patient: Theme park manager Medicare  Primary Care Provider:  Sharilyn Sites, MD, [facility] Liaison follow up remote review for [facility] referral:  Forestine Na discharge high risk for unplanned readmission, patient decline home health follow up  Plan:  Referral for [place], RN BSN [place]  [phone] business mobile phone Toll free office [phone]  *[place]  [phone] Fax number: [phone] [email or web address]

## 2021-12-09 NOTE — Patient Outreach (Signed)
[place] [facility] [ACO] Patient: Theme park manager Medicare  Primary Care Provider:  Sharilyn Sites, MD  [facility] Liaison coverage for [facility]  Reviewed for post hospital follow up needs and notes patient is high risk for unplanned readmission and declined [place] follow up.    Plan:  Referral request for [facility] Care Coordination follow up.   Natividad Brood, RN BSN [place]  [phone] business mobile phone Toll free office [phone]  *[place]  [phone] Fax number: [phone] [email or web address]

## 2021-12-13 ENCOUNTER — Ambulatory Visit: Payer: Self-pay | Admitting: *Deleted

## 2021-12-13 ENCOUNTER — Encounter: Payer: Self-pay | Admitting: *Deleted

## 2021-12-13 ENCOUNTER — Telehealth: Payer: Self-pay | Admitting: *Deleted

## 2021-12-13 NOTE — Chronic Care Management (AMB) (Signed)
  Care Coordination   Note   12/13/2021 Name: [name] MRN: [id number] DOB: September 12, 1950  [name] is a 71 y.o. year old male who sees Sharilyn Sites, MD for primary care. I reached out to [employer] by phone today to offer care coordination services.  [name] was given information about Care Coordination services today including:   The Care Coordination services include support from the care team which includes your Nurse Coordinator, Clinical Social Worker, or Pharmacist.  The Care Coordination team is here to help remove barriers to the health concerns and goals most important to you. Care Coordination services are voluntary, and the patient may decline or stop services at any time by request to their care team member.   Care Coordination Consent Status: Patient agreed to services and verbal consent obtained.   Follow up plan:  Telephone appointment with care coordination team member scheduled for:  12/13/21  Encounter Outcome:  Pt. Scheduled  [place]  Direct Dial: [phone]

## 2021-12-13 NOTE — Patient Outreach (Signed)
  Care Coordination   Initial Visit Note   12/13/2021 Name: [name] MRN: [id number] DOB: Dec 03, 1950  [name] is a 71 y.o. year old male who sees Sharilyn Sites, MD for primary care. I  spoke with Vanessa Efland (caregiver) and patient by telephone today.  What matters to the patients health and wellness today?  Managing blood pressure and heart rate    Goals Addressed             This Visit's Progress    Care Coordination Services       Care Coordination Interventions: Evaluation of current treatment plan related to hypertension and patient's adherence to plan as established by provider Reviewed medications with patient and discussed discontinuation of labetalol, Norvasc, and nifedipine, and recent hospital discharges. Patient is not taking anything for blood pressure or heart reate management Collaborated with Dr Nolon Rod office [phone]) regarding tachycardia and wide pulse pressure Discussed plans with patient for ongoing care management follow up and provided patient with direct contact information for care management team Assessed social determinant of health barriers Nira Conn with Dr Nolon Rod office is going to reach out to patient/caregiver today to discuss these concerns and schedule a follow-up appointment. Currently scheduled for 12/27/21. Blood pressure today was 142/58 and that is pretty normal for him.Heart rate is in the 120s but regular.  Discussed current symptoms. Patient feels well. Denies any fever, chills, pain, cough, signs of infection in toe, or urinary symptoms.  Provided with [facility] contact number and encouraged to reach out as needed        SDOH assessments and interventions completed:  Yes  SDOH Interventions Today    Flowsheet Row Most Recent Value  SDOH Interventions   Housing Interventions Intervention Not Indicated  Transportation Interventions Intervention Not Indicated  Utilities Interventions Intervention Not Indicated  Financial  Strain Interventions Intervention Not Indicated        Care Coordination Interventions Activated:  Yes  Care Coordination Interventions:  Yes, provided   Follow up plan: Follow up call scheduled for 12/17/21    Encounter Outcome:  Pt. Visit Completed   Chong Sicilian, BSN, RN-BC RN Care Coordinator [place]: [phone] Main #: [phone]

## 2021-12-13 NOTE — Chronic Care Management (AMB) (Signed)
  Care Coordination  Outreach Note  12/13/2021 Name: [name] MRN: [id number] DOB: December 21, 1950   Care Coordination Outreach Attempts: An unsuccessful telephone outreach was attempted today to offer the patient information about available care coordination services as a benefit of their health plan.   Follow Up Plan:  Additional outreach attempts will be made to offer the patient care coordination information and services.   Encounter Outcome:  No Answer  [place]  Direct Dial: [phone]

## 2021-12-14 ENCOUNTER — Telehealth: Payer: Self-pay | Admitting: "Endocrinology

## 2021-12-14 NOTE — Telephone Encounter (Signed)
Received referral on patient 07/15/21. Been unable to reach pt, closing referral

## 2021-12-16 ENCOUNTER — Ambulatory Visit (INDEPENDENT_AMBULATORY_CARE_PROVIDER_SITE_OTHER): Payer: Medicare Other | Admitting: General Surgery

## 2021-12-16 ENCOUNTER — Encounter: Payer: Self-pay | Admitting: General Surgery

## 2021-12-16 VITALS — BP 164/82 | HR 116 | Temp 97.9°F | Resp 12 | Ht 77.0 in | Wt 202.0 lb

## 2021-12-16 DIAGNOSIS — R000 Tachycardia, unspecified: Secondary | ICD-10-CM | POA: Diagnosis not present

## 2021-12-16 DIAGNOSIS — S98111S Complete traumatic amputation of right great toe, sequela: Secondary | ICD-10-CM | POA: Diagnosis not present

## 2021-12-16 DIAGNOSIS — Z6823 Body mass index (BMI) 23.0-23.9, adult: Secondary | ICD-10-CM | POA: Diagnosis not present

## 2021-12-16 DIAGNOSIS — Z09 Encounter for follow-up examination after completed treatment for conditions other than malignant neoplasm: Secondary | ICD-10-CM

## 2021-12-16 DIAGNOSIS — N1831 Chronic kidney disease, stage 3a: Secondary | ICD-10-CM | POA: Diagnosis not present

## 2021-12-16 DIAGNOSIS — E109 Type 1 diabetes mellitus without complications: Secondary | ICD-10-CM | POA: Diagnosis not present

## 2021-12-16 DIAGNOSIS — I10 Essential (primary) hypertension: Secondary | ICD-10-CM | POA: Diagnosis not present

## 2021-12-16 DIAGNOSIS — A419 Sepsis, unspecified organism: Secondary | ICD-10-CM | POA: Diagnosis not present

## 2021-12-16 DIAGNOSIS — E08621 Diabetes mellitus due to underlying condition with foot ulcer: Secondary | ICD-10-CM | POA: Diagnosis not present

## 2021-12-16 DIAGNOSIS — M869 Osteomyelitis, unspecified: Secondary | ICD-10-CM | POA: Diagnosis not present

## 2021-12-16 NOTE — Progress Notes (Signed)
Subjective:     [name]  Patient here for follow-up, status post left great toe amputation.  He has been doing well.  He has no complaints. Objective:    BP (!) 164/82   Pulse (!) 116   Temp 97.9 F (36.6 C) (Other (Comment))   Resp 12   Ht '6\' 5"'$  (1.956 m)   Wt 202 lb (91.6 kg)   SpO2 98%   BMI 23.95 kg/m   General:  alert, cooperative, and no distress  Left great toe incision healing well.  Sutures removed.  Silver nitrate applied to a small superficial skin ulceration proximal to the second toe.  No drainage noted.     Assessment:    Doing well postoperatively.    Plan:   Patient instructed to keep the wound clean and dry with soap and water.  This should be done daily.  Follow-up here for a wound check on 12/28/2021.

## 2021-12-17 ENCOUNTER — Ambulatory Visit: Payer: Self-pay | Admitting: *Deleted

## 2021-12-17 NOTE — Patient Outreach (Signed)
  Care Coordination   12/17/2021 Name: [name] MRN: [id number] DOB: 07-Sep-1950   Care Coordination Outreach Attempts:  An unsuccessful telephone outreach was attempted for a scheduled appointment today.  Follow Up Plan:  Additional outreach attempts will be made to offer the patient care coordination information and services.   Encounter Outcome:  No Answer  Care Coordination Interventions Activated:  No   Care Coordination Interventions:  No, not indicated    Chong Sicilian, BSN, RN-BC RN Care Coordinator [place]: [phone] Main #: [phone]

## 2021-12-20 ENCOUNTER — Ambulatory Visit: Payer: Medicare Other | Admitting: Infectious Disease

## 2021-12-23 ENCOUNTER — Encounter: Payer: Medicare Other | Admitting: *Deleted

## 2021-12-25 ENCOUNTER — Encounter (INDEPENDENT_AMBULATORY_CARE_PROVIDER_SITE_OTHER): Payer: Self-pay | Admitting: Gastroenterology

## 2021-12-28 ENCOUNTER — Ambulatory Visit (INDEPENDENT_AMBULATORY_CARE_PROVIDER_SITE_OTHER): Payer: Medicare Other | Admitting: General Surgery

## 2021-12-28 ENCOUNTER — Encounter: Payer: Self-pay | Admitting: General Surgery

## 2021-12-28 VITALS — BP 169/71 | HR 109 | Temp 98.6°F | Resp 14 | Ht 77.0 in | Wt 216.0 lb

## 2021-12-28 DIAGNOSIS — Z09 Encounter for follow-up examination after completed treatment for conditions other than malignant neoplasm: Secondary | ICD-10-CM

## 2021-12-28 NOTE — Progress Notes (Signed)
Subjective:     [name]  Here for wound check, status post left great toe amputation.  He is doing well.  He is keeping it clean and dry with soap and water. Objective:    BP (!) 169/71   Pulse (!) 109   Temp 98.6 F (37 C) (Oral)   Resp 14   Ht '6\' 5"'$  (1.956 m)   Wt 216 lb (98 kg)   SpO2 97%   BMI 25.61 kg/m   General:  alert, cooperative, and no distress  Left foot looks good.  The incision has healed.  A small eschar was removed from the more proximal dorsal aspect of the left foot.  Silver nitrate was applied to the superficial skin wound.  No erythema or purulent drainage is noted.     Assessment:    Doing well postoperatively.    Plan:   Follow-up here as needed.

## 2021-12-31 ENCOUNTER — Encounter (INDEPENDENT_AMBULATORY_CARE_PROVIDER_SITE_OTHER): Payer: Self-pay | Admitting: *Deleted

## 2022-01-05 ENCOUNTER — Other Ambulatory Visit (INDEPENDENT_AMBULATORY_CARE_PROVIDER_SITE_OTHER): Payer: Self-pay | Admitting: Gastroenterology

## 2022-01-05 DIAGNOSIS — R197 Diarrhea, unspecified: Secondary | ICD-10-CM

## 2022-01-11 ENCOUNTER — Other Ambulatory Visit (INDEPENDENT_AMBULATORY_CARE_PROVIDER_SITE_OTHER): Payer: Self-pay | Admitting: Gastroenterology

## 2022-01-20 DIAGNOSIS — E1140 Type 2 diabetes mellitus with diabetic neuropathy, unspecified: Secondary | ICD-10-CM | POA: Diagnosis not present

## 2022-01-20 DIAGNOSIS — L89892 Pressure ulcer of other site, stage 2: Secondary | ICD-10-CM | POA: Diagnosis not present

## 2022-01-20 DIAGNOSIS — M79674 Pain in right toe(s): Secondary | ICD-10-CM | POA: Diagnosis not present

## 2022-01-20 DIAGNOSIS — M79671 Pain in right foot: Secondary | ICD-10-CM | POA: Diagnosis not present

## 2022-01-21 ENCOUNTER — Other Ambulatory Visit (INDEPENDENT_AMBULATORY_CARE_PROVIDER_SITE_OTHER): Payer: Self-pay | Admitting: Gastroenterology

## 2022-02-03 ENCOUNTER — Encounter (HOSPITAL_COMMUNITY): Payer: Self-pay | Admitting: Orthopedic Surgery

## 2022-02-03 ENCOUNTER — Encounter: Payer: Self-pay | Admitting: Orthopaedic Surgery

## 2022-02-03 ENCOUNTER — Other Ambulatory Visit: Payer: Self-pay

## 2022-02-03 ENCOUNTER — Ambulatory Visit (INDEPENDENT_AMBULATORY_CARE_PROVIDER_SITE_OTHER): Payer: Medicare Other | Admitting: Orthopedic Surgery

## 2022-02-03 ENCOUNTER — Ambulatory Visit (INDEPENDENT_AMBULATORY_CARE_PROVIDER_SITE_OTHER): Payer: Medicare Other

## 2022-02-03 DIAGNOSIS — M79671 Pain in right foot: Secondary | ICD-10-CM

## 2022-02-03 DIAGNOSIS — M79674 Pain in right toe(s): Secondary | ICD-10-CM | POA: Diagnosis not present

## 2022-02-03 DIAGNOSIS — L02611 Cutaneous abscess of right foot: Secondary | ICD-10-CM | POA: Diagnosis not present

## 2022-02-03 DIAGNOSIS — L89892 Pressure ulcer of other site, stage 2: Secondary | ICD-10-CM | POA: Diagnosis not present

## 2022-02-03 DIAGNOSIS — M86271 Subacute osteomyelitis, right ankle and foot: Secondary | ICD-10-CM | POA: Diagnosis not present

## 2022-02-03 DIAGNOSIS — E1140 Type 2 diabetes mellitus with diabetic neuropathy, unspecified: Secondary | ICD-10-CM | POA: Diagnosis not present

## 2022-02-03 NOTE — Progress Notes (Signed)
Office Visit Note   Patient: [name]           Date of Birth: 1950-12-22           MRN: [id number] Visit Date: 02/03/2022              Requested by: Sharilyn Sites, [address] [place],  [place] [address] PCP: Sharilyn Sites, MD   Assessment & Plan: Visit Diagnoses:  1. Pain in right foot     Plan: See note above plan for transmetatarsal amputation.  Follow-Up Instructions: No follow-ups on file.   Orders:  Orders Placed This Encounter  Procedures   XR Foot Complete Right   No orders of the defined types were placed in this encounter.     Procedures: No procedures performed   Clinical Data: No additional findings.   Subjective: Chief Complaint  Patient presents with   Right Foot - Pain  Patient presents today for his right foot. He has a history of a right great toe amputation with Dr.Duda. He developed an area on the bottom of his foot that turned into an ulcer. He saw a podiatrist two weeks ago. Since then it has become red and swollen. No drainage. His podiatrist wanted him seen today. He is not on any antibiotics. Using epsom salt soaks.   HPI  Review of Systems   Objective: Vital Signs: There were no vitals taken for this visit.  Physical Exam  Ortho Exam  Specialty Comments:  No specialty comments available.  Imaging: No results found.   PMFS History: Patient Active Problem List   Diagnosis Date Noted   Osteomyelitis of great toe of left foot ([place])    Hyperlipidemia 11/30/2021   Bedbug bite with infection 11/30/2021    Class: Acute   AKI (acute kidney injury) ([place]) 11/12/2021   Sepsis ([place]) 11/11/2021   Amputated great toe of right foot ([place]) 02/20/2019   Stage 3a chronic kidney disease ([place]) 02/13/2019   Osteomyelitis ([place])    DM (diabetes mellitus) ([place]) 01/30/2019   Fatty liver 01/09/2019   Thrombocytopenia, unspecified ([place]) 05/14/2013   Other specified abnormal findings of blood chemistry 05/14/2013   Colon  adenoma 09/13/2012   Anxiety 09/06/2012   Mental retardation 09/06/2012   Chest pain 09/06/2012   Gastroesophageal reflux disease 03/08/2012   Gout 09/06/2011   Hypertension 03/08/2011   Chronic kidney disease 11/30/2009   Urinary tract infection 11/30/2009   Past Medical History:  Diagnosis Date   Anxiety    Arthritis    Colon polyps    Depression    Diabetes mellitus without complication ([place])    Fatty liver    GERD (gastroesophageal reflux disease)    Gout    High cholesterol    History of echocardiogram 09/14/2011   EF >55%; borderline concentric LVH;    History of stress test 06/21/2010   exercise; normal study   Hypertension    MET TEST 08/15/2011   moderate peak VO2 limitation at 66% predicted; mod cardiac impairment with low SV & low anaerobic threshold, mild chronotropic incompetence; low risk   Nausea    Osteomyelitis (HCC)    right great toe   Renal disorder    Renal insufficiency    Thrombocytopenia (HCC)    PMH    Family History  Problem Relation Age of Onset   Colon cancer Brother     Past Surgical History:  Procedure Laterality Date   AMPUTATION Right 02/13/2019   Procedure: RIGHT GREAT TOE  AMPUTATION;  Surgeon: [name], MD;  Location: [facility];  Service: Orthopedics;  Laterality: Right;   AMPUTATION TOE Left 12/02/2021   Procedure: AMPUTATION TOE;  Surgeon: Aviva Signs, MD;  Location[facility];  Service: General;  Laterality: Left;   AMPUTATION TOE Left 12/06/2021   Procedure: CLOSURE OF LEFT GREAT TOE AMPUTATION;  Surgeon: Aviva Signs, MD;  Location: [facility];  Service: General;  Laterality: Left;   CHOLECYSTECTOMY     COLONOSCOPY N/A 09/21/2012   Procedure: COLONOSCOPY;  Surgeon: Rogene Houston, MD;  [facility];  Service: Endoscopy;  Laterality: N/A;  730   COLONOSCOPY N/A 01/30/2019   rehman, eight 4-7 mm polyps in rectum in sigmoid and at splenic flexure, transverse colon and in cecum, one 63m polyp in recto-sigmoid colon, clip was  placed, nine polyps total removed   ESOPHAGOGASTRODUODENOSCOPY  11/2005   normal mucosa and GE junction   HERNIA REPAIR     x 2   HIP SURGERY     Rt hip age 71  NASAL POLYP SURGERY      2-3 months ago   POLYPECTOMY  01/30/2019   Procedure: POLYPECTOMY;  Surgeon: RRogene Houston MD;  [facility];  Service: Endoscopy;;  proximal transverse colon, cecal   Social History   Occupational History   Not on file  Tobacco Use   Smoking status: Never   Smokeless tobacco: Never  Vaping Use   Vaping Use: Never used  Substance and Sexual Activity   Alcohol use: No   Drug use: No   Sexual activity: Not on file

## 2022-02-03 NOTE — Progress Notes (Signed)
Office Visit Note   Patient: [name]           Date of Birth: 03/15/1950           MRN: [id number] Visit Date: 02/03/2022              Requested by: Sharilyn Sites, [address] [place],  [place] [address] PCP: Sharilyn Sites, MD  Chief Complaint  Patient presents with   Right Foot - Pain      HPI: Patient is a 71 year old gentleman who has been having increasing swelling and pain across the right forefoot.  Patient states he went to his podiatrist 2 weeks ago for the ulceration on the plantar aspect of his foot.  Assessment & Plan: Visit Diagnoses:  1. Pain in right foot   2. Cutaneous abscess of right foot   3. Subacute osteomyelitis of right foot ([place])     Plan: Patient has a large abscess and chronic osteomyelitis of the forefoot.  Will plan for a transmetatarsal amputation tomorrow.  Risks and benefits were discussed including risk of the wound not healing need for additional surgery.  Patient states he understands wished to proceed at this time.  Follow-Up Instructions: Return in about 2 weeks (around 02/17/2022).   Ortho Exam  Patient is alert, oriented, no adenopathy, well-dressed, normal affect, normal respiratory effort. Examination patient has a palpable dorsalis pedis pulse.  He has a area of fluctuant swelling over the dorsum of the forefoot over the second and third metatarsal heads.  He has an ulcer on the plantar aspect of his foot that was debrided with a 10 blade knife.  A Q-tip was then used to probe the ulcer and the Q-tip extended to the second and third metatarsal heads as well as extended to the dorsum of the foot eccentrically 4 cm deep.  Imaging: XR Foot Complete Right  Result Date: 02/03/2022 Three-view radiographs of the right foot shows a large abscess that extends dorsally across the forefoot.  Radiographs show destructive bony changes of the second and third metatarsal heads consistent with chronic osteomyelitis.  No images are  attached to the encounter.  Labs: Lab Results  Component Value Date   HGBA1C 6.8 (H) 11/12/2021   HGBA1C 8.6 (H) 01/09/2019   ESRSEDRATE 107 (H) 11/30/2021   ESRSEDRATE 44 (H) 11/11/2021   CRP 22.9 (H) 11/30/2021   CRP 8.8 (H) 11/11/2021   LABURIC 6.9 01/09/2019   REPTSTATUS 12/05/2021 FINAL 12/02/2021   GRAMSTAIN  12/02/2021    ABUNDANT WBC PRESENT,BOTH PMN AND MONONUCLEAR ABUNDANT GRAM NEGATIVE RODS FEW GRAM POSITIVE COCCI IN PAIRS IN CHAINS    CULT  12/02/2021    MODERATE ENTEROCOCCUS FAECALIS MODERATE STREPTOCOCCUS MITIS/ORALIS RARE PROTEUS PENNERI MODERATE PREVOTELLA BIVIA BETA LACTAMASE POSITIVE Performed at [facility], [place] [address], [place], [place] [address]    LABORGA ENTEROCOCCUS FAECALIS 12/02/2021   LABORGA STREPTOCOCCUS MITIS/ORALIS 12/02/2021   LABORGA PROTEUS PENNERI 12/02/2021     Lab Results  Component Value Date   ALBUMIN 2.1 (L) 12/03/2021   ALBUMIN 2.0 (L) 12/02/2021   ALBUMIN 2.2 (L) 12/01/2021    Lab Results  Component Value Date   MG 2.2 12/07/2021   MG 2.0 12/03/2021   MG 1.9 11/30/2021   No results found for: "VD25OH"  No results found for: "PREALBUMIN"    Latest Ref Rng & Units 12/07/2021    5:30 AM 12/06/2021    5:19 AM 12/05/2021    5:22 AM  CBC EXTENDED  WBC 4.0 - 10.5 K/uL 14.2  10.0  9.8   RBC 4.22 - 5.81 MIL/uL 3.63  3.49  3.45   Hemoglobin 13.0 - 17.0 g/dL 10.2  9.7  9.6   HCT 39.0 - 52.0 % 31.4  29.8  29.3   Platelets 150 - 400 K/uL 462  450  440      There is no height or weight on file to calculate BMI.  Orders:  Orders Placed This Encounter  Procedures   XR Foot Complete Right   No orders of the defined types were placed in this encounter.    Procedures: No procedures performed  Clinical Data: No additional findings.  ROS:  All other systems negative, except as noted in the HPI. Review of Systems  Objective: Vital Signs: There were no vitals taken for this visit.  Specialty Comments:   No specialty comments available.  PMFS History: Patient Active Problem List   Diagnosis Date Noted   Osteomyelitis of great toe of left foot ([place])    Hyperlipidemia 11/30/2021   Bedbug bite with infection 11/30/2021    Class: Acute   AKI (acute kidney injury) ([place]) 11/12/2021   Sepsis ([place]) 11/11/2021   Amputated great toe of right foot ([facility]) 02/20/2019   Stage 3a chronic kidney disease ([place]) 02/13/2019   Osteomyelitis ([place])    DM (diabetes mellitus) ([place]) 01/30/2019   Fatty liver 01/09/2019   Thrombocytopenia, unspecified ([place]) 05/14/2013   Other specified abnormal findings of blood chemistry 05/14/2013   Colon adenoma 09/13/2012   Anxiety 09/06/2012   Mental retardation 09/06/2012   Chest pain 09/06/2012   Gastroesophageal reflux disease 03/08/2012   Gout 09/06/2011   Hypertension 03/08/2011   Chronic kidney disease 11/30/2009   Urinary tract infection 11/30/2009   Past Medical History:  Diagnosis Date   Anxiety    Arthritis    Colon polyps    Depression    Diabetes mellitus without complication ([place])    Fatty liver    GERD (gastroesophageal reflux disease)    Gout    High cholesterol    History of echocardiogram 09/14/2011   EF >55%; borderline concentric LVH;    History of stress test 06/21/2010   exercise; normal study   Hypertension    MET TEST 08/15/2011   moderate peak VO2 limitation at 66% predicted; mod cardiac impairment with low SV & low anaerobic threshold, mild chronotropic incompetence; low risk   Nausea    Osteomyelitis (HCC)    right great toe   Renal disorder    Renal insufficiency    Thrombocytopenia (HCC)    PMH    Family History  Problem Relation Age of Onset   Colon cancer Brother     Past Surgical History:  Procedure Laterality Date   AMPUTATION Right 02/13/2019   Procedure: RIGHT GREAT TOE AMPUTATION;  Surgeon: [name], MD;  Location: [facility];  Service: Orthopedics;  Laterality: Right;   AMPUTATION TOE Left 12/02/2021    Procedure: AMPUTATION TOE;  Surgeon: Aviva Signs, MD;  [facility];  Service: General;  Laterality: Left;   AMPUTATION TOE Left 12/06/2021   Procedure: CLOSURE OF LEFT GREAT TOE AMPUTATION;  Surgeon: Aviva Signs, MD;  Location[facility];  Service: General;  Laterality: Left;   CHOLECYSTECTOMY     COLONOSCOPY N/A 09/21/2012   Procedure: COLONOSCOPY;  Surgeon: Rogene Houston, MD;  [facility];  Service: Endoscopy;  Laterality: N/A;  730   COLONOSCOPY N/A 01/30/2019   rehman, eight  4-7 mm polyps in rectum in sigmoid and at splenic flexure, transverse colon and in cecum, one 66m polyp in recto-sigmoid colon, clip was placed, nine polyps total removed   ESOPHAGOGASTRODUODENOSCOPY  11/2005   normal mucosa and GE junction   HERNIA REPAIR     x 2   HIP SURGERY     Rt hip age 71  NASAL POLYP SURGERY      2-3 months ago   POLYPECTOMY  01/30/2019   Procedure: POLYPECTOMY;  Surgeon: RRogene Houston MD;  [facility];  Service: Endoscopy;;  proximal transverse colon, cecal   Social History   Occupational History   Not on file  Tobacco Use   Smoking status: Never   Smokeless tobacco: Never  Vaping Use   Vaping Use: Never used  Substance and Sexual Activity   Alcohol use: No   Drug use: No   Sexual activity: Not on file

## 2022-02-03 NOTE — Progress Notes (Signed)
I spoke with [name], patient's care giver. [name] states that [name] does not complain of chest pain or shortness of breath.  [name] denies having any s/s of Covid in [name] household, also denies any known exposure to Covid.  [name] has type II diabetes, she checks CBG sometime, today it was 180.  I instructed [name] to have [name] to take 10 units of Toujeo if CBG in am is greater than 70 and to not take Actos.  I asked [name] to have [name] o check CBG in am and every 2 hours until he leaves to come to the hospital. I Instructed patient if CBG is less than 70 to take 4 Glucose Tablets or 1 tube of Glucose Gel or 1/2 cup of a clear juice. Recheck CBG in 15 minutes if CBG is not over 70 call, pre- op desk at [phone] for further instructions. If scheduled to receive Insulin, do not take Insulin

## 2022-02-04 ENCOUNTER — Ambulatory Visit (HOSPITAL_COMMUNITY)
Admission: RE | Admit: 2022-02-04 | Discharge: 2022-02-04 | Disposition: A | Discharge status: Home / Self Care | Payer: Medicare Other | Source: Ambulatory Visit | Attending: Orthopedic Surgery | Admitting: Orthopedic Surgery

## 2022-02-04 ENCOUNTER — Ambulatory Visit (HOSPITAL_COMMUNITY): Payer: Medicare Other | Admitting: Certified Registered Nurse Anesthetist

## 2022-02-04 ENCOUNTER — Other Ambulatory Visit: Payer: Self-pay

## 2022-02-04 ENCOUNTER — Encounter (HOSPITAL_COMMUNITY): Payer: Self-pay | Admitting: Orthopedic Surgery

## 2022-02-04 ENCOUNTER — Encounter (HOSPITAL_COMMUNITY)
Admission: RE | Disposition: A | Discharge status: Home / Self Care | Payer: Self-pay | Source: Ambulatory Visit | Attending: Orthopedic Surgery

## 2022-02-04 ENCOUNTER — Ambulatory Visit (HOSPITAL_BASED_OUTPATIENT_CLINIC_OR_DEPARTMENT_OTHER): Payer: Medicare Other | Admitting: Certified Registered Nurse Anesthetist

## 2022-02-04 DIAGNOSIS — M869 Osteomyelitis, unspecified: Secondary | ICD-10-CM

## 2022-02-04 DIAGNOSIS — L02611 Cutaneous abscess of right foot: Secondary | ICD-10-CM

## 2022-02-04 DIAGNOSIS — Z7984 Long term (current) use of oral hypoglycemic drugs: Secondary | ICD-10-CM | POA: Diagnosis not present

## 2022-02-04 DIAGNOSIS — E11621 Type 2 diabetes mellitus with foot ulcer: Secondary | ICD-10-CM | POA: Diagnosis not present

## 2022-02-04 DIAGNOSIS — I10 Essential (primary) hypertension: Secondary | ICD-10-CM | POA: Insufficient documentation

## 2022-02-04 DIAGNOSIS — L97519 Non-pressure chronic ulcer of other part of right foot with unspecified severity: Secondary | ICD-10-CM | POA: Insufficient documentation

## 2022-02-04 DIAGNOSIS — M86171 Other acute osteomyelitis, right ankle and foot: Secondary | ICD-10-CM | POA: Diagnosis not present

## 2022-02-04 DIAGNOSIS — E1169 Type 2 diabetes mellitus with other specified complication: Secondary | ICD-10-CM

## 2022-02-04 DIAGNOSIS — G8918 Other acute postprocedural pain: Secondary | ICD-10-CM | POA: Diagnosis not present

## 2022-02-04 HISTORY — PX: AMPUTATION: SHX166

## 2022-02-04 HISTORY — DX: Anemia, unspecified: D64.9

## 2022-02-04 HISTORY — DX: Unspecified speech disturbances: R47.9

## 2022-02-04 LAB — CBC
HCT: 36.9 % — ABNORMAL LOW (ref 39.0–52.0)
Hemoglobin: 12 g/dL — ABNORMAL LOW (ref 13.0–17.0)
MCH: 27.1 pg (ref 26.0–34.0)
MCHC: 32.5 g/dL (ref 30.0–36.0)
MCV: 83.5 fL (ref 80.0–100.0)
Platelets: 236 10*3/uL (ref 150–400)
RBC: 4.42 MIL/uL (ref 4.22–5.81)
RDW: 14.6 % (ref 11.5–15.5)
WBC: 5.8 10*3/uL (ref 4.0–10.5)
nRBC: 0 % (ref 0.0–0.2)

## 2022-02-04 LAB — POCT I-STAT, CHEM 8
BUN: 20 mg/dL (ref 8–23)
Calcium, Ion: 1.17 mmol/L (ref 1.15–1.40)
Chloride: 100 mmol/L (ref 98–111)
Creatinine, Ser: 1.5 mg/dL — ABNORMAL HIGH (ref 0.61–1.24)
Glucose, Bld: 235 mg/dL — ABNORMAL HIGH (ref 70–99)
HCT: 37 % — ABNORMAL LOW (ref 39.0–52.0)
Hemoglobin: 12.6 g/dL — ABNORMAL LOW (ref 13.0–17.0)
Potassium: 4.8 mmol/L (ref 3.5–5.1)
Sodium: 138 mmol/L (ref 135–145)
TCO2: 26 mmol/L (ref 22–32)

## 2022-02-04 LAB — BASIC METABOLIC PANEL
Anion gap: 10 (ref 5–15)
BUN: 18 mg/dL (ref 8–23)
CO2: 26 mmol/L (ref 22–32)
Calcium: 8.8 mg/dL — ABNORMAL LOW (ref 8.9–10.3)
Chloride: 101 mmol/L (ref 98–111)
Creatinine, Ser: 1.53 mg/dL — ABNORMAL HIGH (ref 0.61–1.24)
GFR, Estimated: 49 mL/min — ABNORMAL LOW (ref >60)
Glucose, Bld: 236 mg/dL — ABNORMAL HIGH (ref 70–99)
Potassium: 4.6 mmol/L (ref 3.5–5.1)
Sodium: 137 mmol/L (ref 135–145)

## 2022-02-04 LAB — GLUCOSE, CAPILLARY
Glucose-Capillary: 245 mg/dL — ABNORMAL HIGH (ref 70–99)
Glucose-Capillary: 182 mg/dL — ABNORMAL HIGH (ref 70–99)
Glucose-Capillary: 187 mg/dL — ABNORMAL HIGH (ref 70–99)

## 2022-02-04 SURGERY — AMPUTATION, FOOT, RAY
Anesthesia: Regional | Laterality: Right

## 2022-02-04 MED ORDER — PROPOFOL 10 MG/ML IV BOLUS
INTRAVENOUS | Status: DC | PRN
  Administered 2022-02-04: 120 mg via INTRAVENOUS

## 2022-02-04 MED ORDER — ONDANSETRON HCL 4 MG/2ML IJ SOLN
INTRAMUSCULAR | Status: DC | PRN
  Administered 2022-02-04: 4 mg via INTRAVENOUS

## 2022-02-04 MED ORDER — PHENYLEPHRINE HCL-NACL 20-0.9 MG/250ML-% IV SOLN
INTRAVENOUS | Status: DC | PRN
  Administered 2022-02-04: 25 ug/min via INTRAVENOUS

## 2022-02-04 MED ORDER — DEXAMETHASONE SODIUM PHOSPHATE 10 MG/ML IJ SOLN
INTRAMUSCULAR | Status: DC | PRN
  Administered 2022-02-04 (×2): 5 mg

## 2022-02-04 MED ORDER — HYDROCODONE-ACETAMINOPHEN 5-325 MG PO TABS: 1.0000 | ORAL_TABLET | ORAL | 0 refills | Status: DC | PRN

## 2022-02-04 MED ORDER — CEFAZOLIN SODIUM-DEXTROSE 2-4 GM/100ML-% IV SOLN
2.0000 g | INTRAVENOUS | Status: AC
  Administered 2022-02-04: 2 g via INTRAVENOUS
  Filled 2022-02-04: qty 100

## 2022-02-04 MED ORDER — FENTANYL CITRATE (PF) 100 MCG/2ML IJ SOLN
INTRAMUSCULAR | Status: AC
  Administered 2022-02-04: 100 ug via INTRAVENOUS
  Filled 2022-02-04: qty 2

## 2022-02-04 MED ORDER — ORAL CARE MOUTH RINSE: 15.0000 mL | Freq: Once | OROMUCOSAL | Status: AC

## 2022-02-04 MED ORDER — FENTANYL CITRATE (PF) 100 MCG/2ML IJ SOLN: 100.0000 ug | Freq: Once | INTRAMUSCULAR | Status: AC

## 2022-02-04 MED ORDER — LACTATED RINGERS IV SOLN: INTRAVENOUS | Status: DC

## 2022-02-04 MED ORDER — ACETAMINOPHEN 500 MG PO TABS: 1000.0000 mg | ORAL_TABLET | Freq: Once | ORAL | Status: DC

## 2022-02-04 MED ORDER — ACETAMINOPHEN 500 MG PO TABS
1000.0000 mg | ORAL_TABLET | Freq: Once | ORAL | Status: AC
  Administered 2022-02-04: 1000 mg via ORAL
  Filled 2022-02-04: qty 2

## 2022-02-04 MED ORDER — PHENYLEPHRINE 80 MCG/ML (10ML) SYRINGE FOR IV PUSH (FOR BLOOD PRESSURE SUPPORT)
PREFILLED_SYRINGE | INTRAVENOUS | Status: DC | PRN
  Administered 2022-02-04 (×5): 160 ug via INTRAVENOUS

## 2022-02-04 MED ORDER — ROPIVACAINE HCL 5 MG/ML IJ SOLN
INTRAMUSCULAR | Status: DC | PRN
  Administered 2022-02-04: 30 mL via PERINEURAL
  Administered 2022-02-04: 15 mL via PERINEURAL

## 2022-02-04 MED ORDER — FENTANYL CITRATE (PF) 100 MCG/2ML IJ SOLN: 25.0000 ug | INTRAMUSCULAR | Status: DC | PRN

## 2022-02-04 MED ORDER — 0.9 % SODIUM CHLORIDE (POUR BTL) OPTIME
TOPICAL | Status: DC | PRN
  Administered 2022-02-04: 1000 mL

## 2022-02-04 MED ORDER — DEXAMETHASONE SODIUM PHOSPHATE 10 MG/ML IJ SOLN
INTRAMUSCULAR | Status: DC | PRN
  Administered 2022-02-04: 5 mg via INTRAVENOUS

## 2022-02-04 MED ORDER — CHLORHEXIDINE GLUCONATE 0.12 % MT SOLN
15.0000 mL | Freq: Once | OROMUCOSAL | Status: AC
  Administered 2022-02-04: 15 mL via OROMUCOSAL
  Filled 2022-02-04: qty 15

## 2022-02-04 MED ORDER — MIDAZOLAM HCL 2 MG/2ML IJ SOLN
INTRAMUSCULAR | Status: AC
  Filled 2022-02-04: qty 2

## 2022-02-04 MED ORDER — INSULIN ASPART 100 UNIT/ML IJ SOLN
0.0000 [IU] | INTRAMUSCULAR | Status: AC | PRN
  Administered 2022-02-04: 2 [IU] via SUBCUTANEOUS
  Administered 2022-02-04: 3 [IU] via SUBCUTANEOUS
  Filled 2022-02-04 (×2): qty 1

## 2022-02-04 SURGICAL SUPPLY — 32 items
BAG COUNTER SPONGE SURGICOUNT (BAG) ×2 IMPLANT
BAG SPNG CNTER NS LX DISP (BAG) ×1
BLADE SAW SGTL MED 73X18.5 STR (BLADE) IMPLANT
BLADE SURG 21 STRL SS (BLADE) ×2 IMPLANT
BNDG COHESIVE 4X5 TAN STRL (GAUZE/BANDAGES/DRESSINGS) ×2 IMPLANT
BNDG COHESIVE 6X5 TAN NS LF (GAUZE/BANDAGES/DRESSINGS) IMPLANT
BNDG GAUZE DERMACEA FLUFF 4 (GAUZE/BANDAGES/DRESSINGS) ×2 IMPLANT
BNDG GZE DERMACEA 4 6PLY (GAUZE/BANDAGES/DRESSINGS)
COVER SURGICAL LIGHT HANDLE (MISCELLANEOUS) ×4 IMPLANT
DRAPE U-SHAPE 47X51 STRL (DRAPES) ×4 IMPLANT
DRSG ADAPTIC 3X8 NADH LF (GAUZE/BANDAGES/DRESSINGS) ×2 IMPLANT
DURAPREP 26ML APPLICATOR (WOUND CARE) ×2 IMPLANT
ELECT REM PT RETURN 9FT ADLT (ELECTROSURGICAL) ×1
ELECTRODE REM PT RTRN 9FT ADLT (ELECTROSURGICAL) ×2 IMPLANT
GAUZE PAD ABD 8X10 STRL (GAUZE/BANDAGES/DRESSINGS) ×4 IMPLANT
GAUZE SPONGE 4X4 12PLY STRL (GAUZE/BANDAGES/DRESSINGS) ×2 IMPLANT
GLOVE BIOGEL PI IND STRL 9 (GLOVE) ×2 IMPLANT
GLOVE SURG ORTHO 9.0 STRL STRW (GLOVE) ×2 IMPLANT
GOWN STRL REUS W/ TWL XL LVL3 (GOWN DISPOSABLE) ×4 IMPLANT
GOWN STRL REUS W/TWL XL LVL3 (GOWN DISPOSABLE) ×2
GRAFT SKIN WND MICRO 38 (Tissue) IMPLANT
KIT BASIN OR (CUSTOM PROCEDURE TRAY) ×2 IMPLANT
KIT PREVENA INCISION MGT 13 (CANNISTER) IMPLANT
KIT TURNOVER KIT B (KITS) ×2 IMPLANT
NS IRRIG 1000ML POUR BTL (IV SOLUTION) ×2 IMPLANT
PACK ORTHO EXTREMITY (CUSTOM PROCEDURE TRAY) ×2 IMPLANT
PAD ARMBOARD 7.5X6 YLW CONV (MISCELLANEOUS) ×4 IMPLANT
STOCKINETTE IMPERVIOUS LG (DRAPES) IMPLANT
SUT ETHILON 2 0 PSLX (SUTURE) ×2 IMPLANT
TOWEL GREEN STERILE (TOWEL DISPOSABLE) ×2 IMPLANT
TUBE CONNECTING 12X1/4 (SUCTIONS) ×2 IMPLANT
YANKAUER SUCT BULB TIP NO VENT (SUCTIONS) ×2 IMPLANT

## 2022-02-04 NOTE — Anesthesia Procedure Notes (Signed)
Anesthesia Regional Block: Adductor canal block   Pre-Anesthetic Checklist: , timeout performed,  Correct Patient, Correct Site, Correct Laterality,  Correct Procedure, Correct Position, site marked,  Risks and benefits discussed,  Pre-op evaluation,  At surgeon's request and post-op pain management  Laterality: Right  Prep: Maximum Sterile Barrier Precautions used, chloraprep       Needles:  Injection technique: Single-shot  Needle Type: Echogenic Stimulator Needle     Needle Length: 9cm  Needle Gauge: 21     Additional Needles:   Procedures:,,,, ultrasound used (permanent image in chart),,    Narrative:  Start time: 02/04/2022 10:25 AM End time: 02/04/2022 10:29 AM Injection made incrementally with aspirations every 5 mL. Anesthesiologist: Freddrick March, MD

## 2022-02-04 NOTE — Anesthesia Procedure Notes (Signed)
Anesthesia Regional Block: Popliteal block   Pre-Anesthetic Checklist: , timeout performed,  Correct Patient, Correct Site, Correct Laterality,  Correct Procedure, Correct Position, site marked,  Risks and benefits discussed,  Pre-op evaluation,  At surgeon's request and post-op pain management  Laterality: Right  Prep: Maximum Sterile Barrier Precautions used, chloraprep       Needles:  Injection technique: Single-shot  Needle Type: Echogenic Stimulator Needle     Needle Length: 9cm  Needle Gauge: 21     Additional Needles:   Procedures:,,,, ultrasound used (permanent image in chart),,    Narrative:  Start time: 02/04/2022 10:20 AM End time: 02/04/2022 10:25 AM Injection made incrementally with aspirations every 5 mL. Anesthesiologist: Freddrick March, MD

## 2022-02-04 NOTE — Transfer of Care (Signed)
Immediate Anesthesia Transfer of Care Note  Patient: [name]  Procedure(s) Performed: RIGHT TRANSMETATARSAL AMPUTATION APPLICATION OF WOUND VAC (Right)  Patient Location: PACU  Anesthesia Type:GA combined with regional for post-op pain  Level of Consciousness: drowsy  Airway & Oxygen Therapy: Patient Spontanous Breathing and Patient connected to face mask oxygen  Post-op Assessment: Report given to RN and Post -op Vital signs reviewed and stable  Post vital signs: Reviewed and stable  Last Vitals:  Vitals Value Taken Time  BP 128/53 02/04/22 1200  Temp    Pulse 84 02/04/22 1200  Resp 9 02/04/22 1200  SpO2 100 % 02/04/22 1200  Vitals shown include unvalidated device data.  Last Pain:  Vitals:   02/04/22 0928  TempSrc:   PainSc: 0-No pain         Complications: No notable events documented.

## 2022-02-04 NOTE — H&P (Signed)
[name] is an 71 y.o. male.   Chief Complaint: Right foot pain and swelling and drainage from the plantar ulcer. HPI: Patient is a 71 year old gentleman who has been having increasing swelling and pain across the right forefoot. Patient states he went to his podiatrist 2 weeks ago for the ulceration on the plantar aspect of his foot.   Past Medical History:  Diagnosis Date   Anemia    Anxiety    Arthritis    Colon polyps    Depression    Diabetes mellitus without complication (HCC)    DM (diabetes mellitus) ([place]) 01/30/2019   Fatty liver    GERD (gastroesophageal reflux disease)    Gout    High cholesterol    History of echocardiogram 09/14/2011   EF >55%; borderline concentric LVH;    History of stress test 06/21/2010   exercise; normal study   Hypertension    MET TEST 08/15/2011   moderate peak VO2 limitation at 66% predicted; mod cardiac impairment with low SV & low anaerobic threshold, mild chronotropic incompetence; low risk   Nausea    Osteomyelitis (HCC)    right great toe   Renal disorder    Renal insufficiency    Speech impediment    Thrombocytopenia ([place])    PMH    Past Surgical History:  Procedure Laterality Date   AMPUTATION Right 02/13/2019   Procedure: RIGHT GREAT TOE AMPUTATION;  Surgeon: [name], MD;  Location: [facility];  Service: Orthopedics;  Laterality: Right;   AMPUTATION TOE Left 12/02/2021   Procedure: AMPUTATION TOE;  Surgeon: Aviva Signs, MD;  Location[facility];  Service: General;  Laterality: Left;   AMPUTATION TOE Left 12/06/2021   Procedure: CLOSURE OF LEFT GREAT TOE AMPUTATION;  Surgeon: Aviva Signs, MD;  Location: [facility];  Service: General;  Laterality: Left;   CHOLECYSTECTOMY     COLONOSCOPY N/A 09/21/2012   Procedure: COLONOSCOPY;  Surgeon: Rogene Houston, MD;  [facility];  Service: Endoscopy;  Laterality: N/A;  730   COLONOSCOPY N/A 01/30/2019   rehman, eight 4-7 mm polyps in rectum in sigmoid and at splenic  flexure, transverse colon and in cecum, one 74m polyp in recto-sigmoid colon, clip was placed, nine polyps total removed   ESOPHAGOGASTRODUODENOSCOPY  11/2005   normal mucosa and GE junction   HERNIA REPAIR     x 2   HIP SURGERY     Rt hip age 71  NASAL POLYP SURGERY      2-3 months ago   POLYPECTOMY  01/30/2019   Procedure: POLYPECTOMY;  Surgeon: RRogene Houston MD;  [facility];  Service: Endoscopy;;  proximal transverse colon, cecal    Family History  Problem Relation Age of Onset   Colon cancer Brother    Social History:  reports that he has never smoked. He has never used smokeless tobacco. He reports that he does not drink alcohol and does not use drugs.  Allergies:  Allergies  Allergen Reactions   Ivp Dye [Iodinated Contrast Media]     Sped heart reate up    Tetanus Toxoids Rash    No medications prior to admission.    No results found for this or any previous visit (from the past 48 hour(s)). XR Foot Complete Right  Result Date: 02/03/2022 Three-view radiographs of the right foot shows a large abscess that extends dorsally across the forefoot.  Radiographs show destructive bony changes of the second and third metatarsal heads  consistent with chronic osteomyelitis.   Review of Systems  All other systems reviewed and are negative.   There were no vitals taken for this visit. Physical Exam  Patient is alert, oriented, no adenopathy, well-dressed, normal affect, normal respiratory effort. Examination patient has a palpable dorsalis pedis pulse.  He has a area of fluctuant swelling over the dorsum of the forefoot over the second and third metatarsal heads.  He has an ulcer on the plantar aspect of his foot that was debrided with a 10 blade knife.  A Q-tip was then used to probe the ulcer and the Q-tip extended to the second and third metatarsal heads as well as extended to the dorsum of the foot eccentrically 4 cm deep. Assessment/Plan Assessment:  Osteomyelitis abscess right foot.  Plan: Will plan for a right transmetatarsal amputation.  Risk and benefits were discussed including risk of the wound not healing need for additional surgery need for higher level amputation.  Patient states he understands wished to proceed at this time.  [name], MD 02/04/2022, 8:37 AM

## 2022-02-04 NOTE — Op Note (Signed)
02/04/2022  12:27 PM  PATIENT:  [name]    PRE-OPERATIVE DIAGNOSIS:  Abscess, Osteomyelitis Right Foot  POST-OPERATIVE DIAGNOSIS:  Same  PROCEDURE:  RIGHT TRANSMETATARSAL AMPUTATION APPLICATION OF WOUND VAC Application Kerecis micro graft 38 cm.  SURGEON:  [name], MD  PHYSICIAN ASSISTANT:None ANESTHESIA:   General  PREOPERATIVE INDICATIONS:  LEM PEARY is a  71 y.o. male with a diagnosis of Abscess, Osteomyelitis Right Foot who failed conservative measures and elected for surgical management.    The risks benefits and alternatives were discussed with the patient preoperatively including but not limited to the risks of infection, bleeding, nerve injury, cardiopulmonary complications, the need for revision surgery, among others, and the patient was willing to proceed.  OPERATIVE IMPLANTS: Kerecis micro graft 38 cm.  '@ENCIMAGES'$ @  OPERATIVE FINDINGS: Good petechial bleeding the wound margins were clear no signs of abscess involving the amputation site.  OPERATIVE PROCEDURE: Patient was brought the operating room and underwent a general anesthetic.  After adequate levels anesthesia were obtained patient's right lower extremity was prepped using DuraPrep draped into a sterile field a timeout was called.  A fishmouth incision was made proximal to the abscess and infected tissue and proximal to the ulcerative tissue on the plantar aspect of the foot.  This was carried down to the metatarsals and an oscillating saw was used to perform a transmetatarsal amputation.  Electrocautery was used for hemostasis.  The wound was irrigated with normal saline.  The wound bed was then filled with Kerecis micro graft 38 cm.  The incision was closed using 2-0 nylon this was covered with a 13 cm Prevena wound VAC.  This had a good suction fit.  Patient was extubated taken the PACU in stable condition.   DISCHARGE PLANNING:  Antibiotic duration: Preoperative antibiotics  only  Weightbearing: Touchdown weightbearing on the right  Pain medication: Prescription written for Vicodin  Dressing care/ Wound VAC: Continue wound VAC for 1 week  Ambulatory devices: Walker or crutches  Discharge to: Home.  Follow-up: In the office 1 week post operative.

## 2022-02-04 NOTE — Anesthesia Preprocedure Evaluation (Addendum)
Anesthesia Evaluation  Patient identified by MRN, date of birth, ID band Patient awake    Reviewed: Allergy & Precautions, NPO status , Patient's Chart, lab work & pertinent test results  Airway Mallampati: II  TM Distance: >3 FB Neck ROM: Full    Dental  (+) Edentulous Upper, Edentulous Lower, Dental Advisory Given   Pulmonary neg pulmonary ROS   Pulmonary exam normal breath sounds clear to auscultation       Cardiovascular hypertension, Normal cardiovascular exam Rhythm:Regular Rate:Normal     Neuro/Psych  PSYCHIATRIC DISORDERS Anxiety Depression    negative neurological ROS     GI/Hepatic Neg liver ROS,GERD  ,,  Endo/Other  diabetes, Type 2, Oral Hypoglycemic Agents    Renal/GU Renal InsufficiencyRenal diseaseLab Results      Component                Value               Date                      CREATININE               1.50 (H)            02/04/2022                BUN                      20                  02/04/2022                NA                       138                 02/04/2022                K                        4.8                 02/04/2022                CL                       100                 02/04/2022                CO2                      26                  12/07/2021             negative genitourinary   Musculoskeletal  (+) Arthritis ,    Abdominal   Peds  Hematology negative hematology ROS (+)   Anesthesia Other Findings   Reproductive/Obstetrics                             Anesthesia Physical Anesthesia Plan  ASA: 3  Anesthesia Plan: General and Regional   Post-op Pain Management: Regional block* and Tylenol PO (pre-op)*   Induction: Intravenous  PONV Risk Score and  Plan: 2 and Ondansetron, Dexamethasone and Treatment may vary due to age or medical condition  Airway Management Planned: LMA  Additional Equipment:   Intra-op Plan:    Post-operative Plan: Extubation in OR  Informed Consent: I have reviewed the patients History and Physical, chart, labs and discussed the procedure including the risks, benefits and alternatives for the proposed anesthesia with the patient or authorized representative who has indicated his/her understanding and acceptance.     Dental advisory given  Plan Discussed with: CRNA  Anesthesia Plan Comments:        Anesthesia Quick Evaluation

## 2022-02-07 ENCOUNTER — Encounter (HOSPITAL_COMMUNITY): Payer: Self-pay | Admitting: Orthopedic Surgery

## 2022-02-08 NOTE — Anesthesia Postprocedure Evaluation (Signed)
Anesthesia Post Note  Patient: [name]  Procedure(s) Performed: RIGHT TRANSMETATARSAL AMPUTATION APPLICATION OF WOUND VAC (Right)     Patient location during evaluation: PACU Anesthesia Type: Regional and General Level of consciousness: awake and alert Pain management: pain level controlled Vital Signs Assessment: post-procedure vital signs reviewed and stable Respiratory status: spontaneous breathing, nonlabored ventilation, respiratory function stable and patient connected to nasal cannula oxygen Cardiovascular status: blood pressure returned to baseline and stable Postop Assessment: no apparent nausea or vomiting Anesthetic complications: no  No notable events documented.  Last Vitals:  Vitals:   02/04/22 1215 02/04/22 1230  BP: 134/66 (!) 142/72  Pulse: 90 88  Resp: 11 14  Temp:  36.5 C  SpO2: 96% 97%    Last Pain:  Vitals:   02/04/22 1200  TempSrc:   PainSc: 0-No pain   Pain Goal:                   Cariann Kinnamon L Armeda Plumb

## 2022-02-15 ENCOUNTER — Ambulatory Visit (INDEPENDENT_AMBULATORY_CARE_PROVIDER_SITE_OTHER): Payer: Medicare Other | Admitting: Orthopedic Surgery

## 2022-02-15 ENCOUNTER — Encounter: Payer: Self-pay | Admitting: Orthopedic Surgery

## 2022-02-15 DIAGNOSIS — N189 Chronic kidney disease, unspecified: Secondary | ICD-10-CM | POA: Diagnosis not present

## 2022-02-15 DIAGNOSIS — Z89431 Acquired absence of right foot: Secondary | ICD-10-CM | POA: Diagnosis not present

## 2022-02-15 DIAGNOSIS — R809 Proteinuria, unspecified: Secondary | ICD-10-CM | POA: Diagnosis not present

## 2022-02-15 DIAGNOSIS — D508 Other iron deficiency anemias: Secondary | ICD-10-CM | POA: Diagnosis not present

## 2022-02-15 DIAGNOSIS — E1122 Type 2 diabetes mellitus with diabetic chronic kidney disease: Secondary | ICD-10-CM | POA: Diagnosis not present

## 2022-02-15 DIAGNOSIS — E1129 Type 2 diabetes mellitus with other diabetic kidney complication: Secondary | ICD-10-CM | POA: Diagnosis not present

## 2022-02-15 MED ORDER — DOXYCYCLINE HYCLATE 100 MG PO TABS: 100.0000 mg | ORAL_TABLET | Freq: Two times a day (BID) | ORAL | 0 refills | Status: DC

## 2022-02-15 NOTE — Progress Notes (Signed)
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  Patient states the wound VAC came off last night.  Patient has been full weightbearing and walking to his mailbox.  Assessment & Plan: Visit Diagnoses:  1. History of transmetatarsal amputation of right foot ([place])     Plan: Start Dial soap cleansing 4 x 4 gauze dressing and an Ace wrap.  Will give him a new postoperative shoe discussed the importance of nonweightbearing.  Discussed that with too much weightbearing the wound will dehisce further and patient may require a below-knee amputation.  Follow-Up Instructions: No follow-ups on file.   Ortho Exam  Patient is alert, oriented, no adenopathy, well-dressed, normal affect, normal respiratory effort. Examination there is cellulitis across the forefoot.  There is dehiscence of the wound and maceration.  Dry dressing applied prescription for doxycycline.  Imaging: No results found.   Labs: Lab Results  Component Value Date   HGBA1C 6.8 (H) 11/12/2021   HGBA1C 8.6 (H) 01/09/2019   ESRSEDRATE 107 (H) 11/30/2021   ESRSEDRATE 44 (H) 11/11/2021   CRP 22.9 (H) 11/30/2021   CRP 8.8 (H) 11/11/2021   LABURIC 6.9 01/09/2019   REPTSTATUS 12/05/2021 FINAL 12/02/2021   GRAMSTAIN  12/02/2021    ABUNDANT WBC PRESENT,BOTH PMN AND MONONUCLEAR ABUNDANT GRAM NEGATIVE RODS FEW GRAM POSITIVE COCCI IN PAIRS IN CHAINS    CULT  12/02/2021    MODERATE ENTEROCOCCUS FAECALIS MODERATE STREPTOCOCCUS MITIS/ORALIS RARE PROTEUS PENNERI MODERATE PREVOTELLA BIVIA BETA  LACTAMASE POSITIVE Performed at [facility], [place] [address], [place], [place] [address]    LABORGA ENTEROCOCCUS FAECALIS 12/02/2021   LABORGA STREPTOCOCCUS MITIS/ORALIS 12/02/2021   LABORGA PROTEUS PENNERI 12/02/2021     Lab Results  Component Value Date   ALBUMIN 2.1 (L) 12/03/2021   ALBUMIN 2.0 (L) 12/02/2021   ALBUMIN 2.2 (L) 12/01/2021    Lab Results  Component Value Date   MG 2.2 12/07/2021   MG 2.0 12/03/2021   MG 1.9 11/30/2021   No results found for: "VD25OH"  No results found for: "PREALBUMIN"    Latest Ref Rng & Units 02/04/2022    9:39 AM 02/04/2022    9:23 AM 12/07/2021    5:30 AM  CBC EXTENDED  WBC 4.0 - 10.5 K/uL  5.8  14.2   RBC 4.22 - 5.81 MIL/uL  4.42  3.63   Hemoglobin 13.0 - 17.0 g/dL 12.6  12.0  10.2   HCT 39.0 - 52.0 % 37.0  36.9  31.4   Platelets 150 - 400 K/uL  236  462      There is no height or weight on file to calculate BMI.  Orders:  No orders of the defined types were placed in this encounter.  No orders of the defined types were placed in this encounter.    Procedures: No procedures performed  Clinical Data: No additional findings.  ROS:  All other systems  negative, except as noted in the HPI. Review of Systems  Objective: Vital Signs: There were no vitals taken for this visit.  Specialty Comments:  No specialty comments available.  PMFS History: Patient Active Problem List   Diagnosis Date Noted   Cutaneous abscess of right foot 02/04/2022   Osteomyelitis of great toe of left foot ([place])    Hyperlipidemia 11/30/2021   Bedbug bite with infection 11/30/2021    Class: Acute   AKI (acute kidney injury) ([place]) 11/12/2021   Sepsis ([place]) 11/11/2021   Amputated great toe of right foot ([place]) 02/20/2019   Stage 3a chronic kidney disease ([place]) 02/13/2019   Osteomyelitis ([place])    DM (diabetes mellitus) ([place]) 01/30/2019   Fatty liver 01/09/2019   Thrombocytopenia, unspecified ([place]) 05/14/2013   Other specified  abnormal findings of blood chemistry 05/14/2013   Colon adenoma 09/13/2012   Anxiety 09/06/2012   Mental retardation 09/06/2012   Chest pain 09/06/2012   Gastroesophageal reflux disease 03/08/2012   Gout 09/06/2011   Hypertension 03/08/2011   Chronic kidney disease 11/30/2009   Urinary tract infection 11/30/2009   Past Medical History:  Diagnosis Date   Anemia    Anxiety    Arthritis    Colon polyps    Depression    Diabetes mellitus without complication (HCC)    DM (diabetes mellitus) ([place]) 01/30/2019   Fatty liver    GERD (gastroesophageal reflux disease)    Gout    High cholesterol    History of echocardiogram 09/14/2011   EF >55%; borderline concentric LVH;    History of stress test 06/21/2010   exercise; normal study   Hypertension    MET TEST 08/15/2011   moderate peak VO2 limitation at 66% predicted; mod cardiac impairment with low SV & low anaerobic threshold, mild chronotropic incompetence; low risk   Nausea    Osteomyelitis (HCC)    right great toe   Renal disorder    Renal insufficiency    Speech impediment    Thrombocytopenia (HCC)    PMH    Family History  Problem Relation Age of Onset   Colon cancer Brother     Past Surgical History:  Procedure Laterality Date   AMPUTATION Right 02/13/2019   Procedure: RIGHT GREAT TOE AMPUTATION;  Surgeon: [name], MD;  Location: [facility];  Service: Orthopedics;  Laterality: Right;   AMPUTATION Right 02/04/2022   Procedure: RIGHT TRANSMETATARSAL AMPUTATION APPLICATION OF WOUND VAC;  Surgeon: [name], MD;  Location: [facility];  Service: Orthopedics;  Laterality: Right;   AMPUTATION TOE Left 12/02/2021   Procedure: AMPUTATION TOE;  Surgeon: Aviva Signs, MD;  [facility];  Service: General;  Laterality: Left;   AMPUTATION TOE Left 12/06/2021   Procedure: CLOSURE OF LEFT GREAT TOE AMPUTATION;  Surgeon: Aviva Signs, MD;  [facility];  Service: General;  Laterality: Left;   CHOLECYSTECTOMY      COLONOSCOPY N/A 09/21/2012   Procedure: COLONOSCOPY;  Surgeon: Rogene Houston, MD;  [facility];  Service: Endoscopy;  Laterality: N/A;  730   COLONOSCOPY N/A 01/30/2019   rehman, eight 4-7 mm polyps in rectum in sigmoid and at splenic flexure, transverse colon and in cecum, one 29m polyp in recto-sigmoid colon, clip was placed, nine polyps total removed   ESOPHAGOGASTRODUODENOSCOPY  11/2005   normal mucosa and GE junction   HERNIA REPAIR     x 2   HIP SURGERY     Rt hip age 72  NASAL  POLYP SURGERY      2-3 months ago   POLYPECTOMY  01/30/2019   Procedure: POLYPECTOMY;  Surgeon: Rogene Houston, MD;  [facility];  Service: Endoscopy;;  proximal transverse colon, cecal   Social History   Occupational History   Not on file  Tobacco Use   Smoking status: Never   Smokeless tobacco: Never  Vaping Use   Vaping Use: Never used  Substance and Sexual Activity   Alcohol use: No   Drug use: No   Sexual activity: Not on file

## 2022-02-16 ENCOUNTER — Encounter: Payer: Self-pay | Admitting: Nurse Practitioner

## 2022-02-16 ENCOUNTER — Ambulatory Visit: Payer: Medicare Other | Attending: Nurse Practitioner | Admitting: Nurse Practitioner

## 2022-02-16 VITALS — BP 139/80 | HR 93 | Ht 77.0 in | Wt 219.0 lb

## 2022-02-16 DIAGNOSIS — I10 Essential (primary) hypertension: Secondary | ICD-10-CM

## 2022-02-16 DIAGNOSIS — E785 Hyperlipidemia, unspecified: Secondary | ICD-10-CM

## 2022-02-16 DIAGNOSIS — R011 Cardiac murmur, unspecified: Secondary | ICD-10-CM | POA: Diagnosis not present

## 2022-02-16 DIAGNOSIS — Z8739 Personal history of other diseases of the musculoskeletal system and connective tissue: Secondary | ICD-10-CM | POA: Diagnosis not present

## 2022-02-16 MED ORDER — LABETALOL HCL 100 MG PO TABS: 100.0000 mg | ORAL_TABLET | Freq: Two times a day (BID) | ORAL | 11 refills | Status: DC

## 2022-02-16 NOTE — Patient Instructions (Signed)
Medication Instructions:  Start Labetalol 100 mg Two Times Daily   *If you need a refill on your cardiac medications before your next appointment, please call your pharmacy*   Lab Work: NONE   If you have labs (blood work) drawn today and your tests are completely normal, you will receive your results only by: [place] (if you have MyChart) OR A paper copy in the mail If you have any lab test that is abnormal or we need to change your treatment, we will call you to review the results.   Testing/Procedures: Your physician has requested that you have an echocardiogram. Echocardiography is a painless test that uses sound waves to create images of your heart. It provides your doctor with information about the size and shape of your heart and how well your heart's chambers and valves are working. This procedure takes approximately one hour. There are no restrictions for this procedure. Please do NOT wear cologne, perfume, aftershave, or lotions (deodorant is allowed). Please arrive 15 minutes prior to your appointment time.    Follow-Up: At [facility], you and your health needs are our priority.  As part of our continuing mission to provide you with exceptional heart care, we have created designated Provider Care Teams.  These Care Teams include your primary Cardiologist (physician) and Advanced Practice Providers (APPs -  Physician Assistants and Nurse Practitioners) who all work together to provide you with the care you need, when you need it.  We recommend signing up for the patient portal called "MyChart".  Sign up information is provided on this After Visit Summary.  MyChart is used to connect with patients for Virtual Visits (Telemedicine).  Patients are able to view lab/test results, encounter notes, upcoming appointments, etc.  Non-urgent messages can be sent to your provider as well.   To learn more about what you can do with MyChart, go to [email or web address].     Your next appointment:   6 week(s)  The format for your next appointment:   In Person  Provider:   Finis Bud, NP    Other Instructions Thank you for choosing [facility]!    Important Information About Sugar

## 2022-02-16 NOTE — Progress Notes (Signed)
Cardiology Office Note:    Date: 02/16/2022  ID:  [name], DOB [birth year], MRN [id number]  PCP:  Assunta Found, MD   [facility] Providers Cardiologist:  Dina Rich, MD     Referring MD: Assunta Found, MD   CC: Here for 1 year follow-up  History of Present Illness:    [name] is a 72 y.o. male with a hx of the following:  Atypical chest pain Hyperlipidemia Type 2 diabetes GERD Gout Anemia CKD stage III (followed by Dr. Wolfgang Phoenix) Mental handicap Hx of osteomyelitis   Patient is a 72 year old male with past medical history as mentioned above.  In 2012 he underwent nuclear stress test that was negative for ischemia.  Repeat nuclear stress test performed at [facility] in 2019 that was negative for ischemia and CT of the chest was negative for anything acute.  Has history of prior amputation of right great toe for osteomyelitis.  Last seen by Dr. Dina Rich on September 29, 2020.  Patient noted occasional shortness of breath with exertion, stable for him.  Denied any chest pain.  His nephrologist changed carvedilol to labetalol for better blood pressure effects.  Blood pressure was elevated in clinic at 140/70 and was recommended to monitor BP at home to evaluate for component of whitecoat hypertension.  Leg edema was noted on exam, trace bilateral edema noted.  This was considered mild and asymptomatic.  Dr. Dina Rich recommended that if this were to progress, would need repeat echocardiogram and consider diuretic.  No medications were added or changed.  Was told to follow-up in 1 year.  Today he presents for 1 year follow-up with his relative. Patient is a poor hisotiran d/t mental handicap. Relative states he has had more amputations along bilateral feet. Patient states he enjoys spending time in the outdoors. Denies any chest pain, shortness of breath, palpitations, syncope, presyncope, dizziness, orthopnea, PND, swelling, acute bleeding,  or claudication. Taking Doxycycline currently. SBP averages 140's at home. Denies any other questions or concerns.   Past Medical History:  Diagnosis Date   Anemia    Anxiety    Arthritis    Colon polyps    Depression    Diabetes mellitus without complication (HCC)    DM (diabetes mellitus) (HCC) 01/30/2019   Fatty liver    GERD (gastroesophageal reflux disease)    Gout    High cholesterol    History of echocardiogram 09/14/2011   EF >55%; borderline concentric LVH;    History of stress test 06/21/2010   exercise; normal study   Hypertension    MET TEST 08/15/2011   moderate peak VO2 limitation at 66% predicted; mod cardiac impairment with low SV & low anaerobic threshold, mild chronotropic incompetence; low risk   Nausea    Osteomyelitis (HCC)    right great toe   Renal disorder    Renal insufficiency    Speech impediment    Thrombocytopenia (HCC)    PMH    Past Surgical History:  Procedure Laterality Date   AMPUTATION Right 02/13/2019   Procedure: RIGHT GREAT TOE AMPUTATION;  Surgeon: Nadara Mustard, MD;  Location: [facility] OR;  Service: Orthopedics;  Laterality: Right;   AMPUTATION Right 02/04/2022   Procedure: RIGHT TRANSMETATARSAL AMPUTATION APPLICATION OF WOUND VAC;  Surgeon: Nadara Mustard, MD;  Location: [facility] OR;  Service: Orthopedics;  Laterality: Right;   AMPUTATION TOE Left 12/02/2021   Procedure: AMPUTATION TOE;  Surgeon: Franky Macho, MD;  Location: [facility];  Service:  General;  Laterality: Left;   AMPUTATION TOE Left 12/06/2021   Procedure: CLOSURE OF LEFT GREAT TOE AMPUTATION;  Surgeon: Franky Macho, MD;  Location: [facility];  Service: General;  Laterality: Left;   CHOLECYSTECTOMY     COLONOSCOPY N/A 09/21/2012   Procedure: COLONOSCOPY;  Surgeon: Malissa Hippo, MD;  [facility];  Service: Endoscopy;  Laterality: N/A;  730   COLONOSCOPY N/A 01/30/2019   rehman, eight 4-7 mm polyps in rectum in sigmoid and at splenic flexure, transverse colon and in  cecum, one 9mm polyp in recto-sigmoid colon, clip was placed, nine polyps total removed   ESOPHAGOGASTRODUODENOSCOPY  11/2005   normal mucosa and GE junction   HERNIA REPAIR     x 2   HIP SURGERY     Rt hip age 40   NASAL POLYP SURGERY      2-3 months ago   POLYPECTOMY  01/30/2019   Procedure: POLYPECTOMY;  Surgeon: Malissa Hippo, MD;  [facility];  Service: Endoscopy;;  proximal transverse colon, cecal    Current Medications: Current Meds  Medication Sig   ACCU-CHEK GUIDE test strip USE AS DIRECTED TO TEST TWICE DAILY   acetaminophen (TYLENOL) 500 MG tablet Take 500-1,000 mg by mouth every 6 (six) hours as needed for mild pain or headache. Pain   atorvastatin (LIPITOR) 10 MG tablet Take 10 mg by mouth at bedtime.   B Complex-C (B-COMPLEX WITH VITAMIN C) tablet Take 1 tablet by mouth daily.   buPROPion (WELLBUTRIN XL) 150 MG 24 hr tablet Take 150 mg by mouth at bedtime.    cetirizine (ZYRTEC) 10 MG tablet Take 10 mg by mouth daily as needed for allergies.   Cholecalciferol (VITAMIN D) 50 MCG (2000 UT) CAPS Take 2,000 Units by mouth daily.    doxycycline (VIBRA-TABS) 100 MG tablet Take 1 tablet (100 mg total) by mouth 2 (two) times daily.   ferrous sulfate 325 (65 FE) MG EC tablet Take 325 mg by mouth daily with breakfast.   HYDROcodone-acetaminophen (NORCO/VICODIN) 5-325 MG tablet Take 1 tablet by mouth every 4 (four) hours as needed.   labetalol (NORMODYNE) 100 MG tablet Take 1 tablet (100 mg total) by mouth 2 (two) times daily.   loperamide (IMODIUM) 2 MG capsule TAKE ONE CAPSULE BY MOUTH TWO TIMES DAILY AS NEEDED FOR DIARRHEA OR LOOSE STOOLS (Patient taking differently: Take 2 mg by mouth 2 (two) times daily as needed for diarrhea or loose stools.)   Omega-3 Fatty Acids (FISH OIL) 1000 MG CAPS Take 1,000 mg by mouth 2 (two) times daily. Afternoon & bedtime.   omeprazole (PRILOSEC) 20 MG capsule TAKE ONE CAPSULE BY MOUTH DAILY. NEEDS OFFICE VISIT FOR FURTHER REFILLS.    pioglitazone (ACTOS) 30 MG tablet Take 30 mg by mouth daily.   sertraline (ZOLOFT) 100 MG tablet Take 100 mg by mouth at bedtime.    sodium bicarbonate 650 MG tablet Take 650 mg by mouth 2 (two) times daily.   TOUJEO MAX SOLOSTAR 300 UNIT/ML Solostar Pen Inject 10 Units into the skin daily. (Patient taking differently: Inject 20 Units into the skin daily.)   ULORIC 80 MG TABS Take 80 mg by mouth daily.      Allergies:   Ivp dye [iodinated contrast media] and Tetanus toxoids   Social History   Socioeconomic History   Marital status: Single    Spouse name: Not on file   Number of children: Not on file   Years of education: Not on  file   Highest education level: Not on file  Occupational History   Not on file  Tobacco Use   Smoking status: Never   Smokeless tobacco: Never  Vaping Use   Vaping Use: Never used  Substance and Sexual Activity   Alcohol use: No   Drug use: No   Sexual activity: Not on file  Other Topics Concern   Not on file  Social History Narrative   Not on file   Social Determinants of Health   Financial Resource Strain: Low Risk  (12/13/2021)   Overall Financial Resource Strain (CARDIA)    Difficulty of Paying Living Expenses: Not hard at all  Food Insecurity: No Food Insecurity (11/12/2021)   Hunger Vital Sign    Worried About Running Out of Food in the Last Year: Never true    Ran Out of Food in the Last Year: Never true  Transportation Needs: No Transportation Needs (12/13/2021)   PRAPARE - Administrator, Civil Service (Medical): No    Lack of Transportation (Non-Medical): No  Physical Activity: Not on file  Stress: Not on file  Social Connections: Not on file     Family History: The patient's family history includes Colon cancer in his brother.  ROS:   Review of Systems  Constitutional: Negative.   HENT: Negative.    Eyes: Negative.   Respiratory: Negative.    Cardiovascular: Negative.   Gastrointestinal: Negative.    Genitourinary: Negative.   Musculoskeletal: Negative.   Skin: Negative.   Neurological: Negative.   Endo/Heme/Allergies: Negative.   Psychiatric/Behavioral: Negative.      Please see the history of present illness.    All other systems reviewed and are negative.  EKGs/Labs/Other Studies Reviewed:    The following studies were reviewed today:   EKG:  EKG is not ordered today.     Normal Myoview on 05/22/2017.   Recent Labs: 11/11/2021: TSH 0.916 12/03/2021: ALT 19 12/07/2021: Magnesium 2.2 02/04/2022: BUN 20; Creatinine, Ser 1.50; Hemoglobin 12.6; Platelets 236; Potassium 4.8; Sodium 138  Recent Lipid Panel No results found for: "CHOL", "TRIG", "HDL", "CHOLHDL", "VLDL", "LDLCALC", "LDLDIRECT"   Physical Exam:    VS:  BP 139/80 (BP Location: Left Arm, Patient Position: Sitting, Cuff Size: Normal)   Pulse 93   Ht 6\' 5"  (1.956 m)   Wt 219 lb (99.3 kg)   SpO2 90%   BMI 25.97 kg/m     Wt Readings from Last 3 Encounters:  02/16/22 219 lb (99.3 kg)  02/04/22 225 lb (102.1 kg)  12/28/21 216 lb (98 kg)     GEN: Well nourished, well developed in no acute distress HEENT: Normal NECK: No JVD; No carotid bruits CARDIAC: S1/S2, RRR, Grade 1/6 systolic murmur noted along LSB, no rubs or gallops noted; 2+ pulses noted throughout RESPIRATORY:  Clear to auscultation without rales, wheezing or rhonchi  MUSCULOSKELETAL:  No edema; No deformity  SKIN: Warm and dry NEUROLOGIC:  Alert and oriented x 3 PSYCHIATRIC:  Normal affect   ASSESSMENT:    1. Primary hypertension    PLAN:    In order of problems listed above:  HTN SBP averaging 140's. SBP 130's-140's on exam. SBP goal < 130. Was on labetalol previously prescribed by Nephrologist.  BP meds previously stopped by PCP.  Will start labetalol 100 mg twice daily. BP log given today. Discussed to monitor BP at home at least 2 hours after medications and sitting for 5-10 minutes. Will bring log to next visit. Heart healthy  diet  and regular cardiovascular exercise encouraged.   HLD Lipid profile in 07/2021 showed normal LDL; however, high triglycerides and low HDL. Continue current medication therapy. Pt requests to work on lifestyle changes before adjusting medications. Heart healthy diet and regular cardiovascular exercise encouraged.  Murmur Grade 1/6 murmur noted on exam. No recent Echocardiogram on file. Will update 2D Echo at this time.   Hx of osteomyelitis Hx of osteomyelitis and has had toe amputations with more amputations along bilateral feet recently. On doxycycline currently. Continue follow up with Orthopedics and PCP.      5. Disposition: Follow-up with me in 6 weeks or sooner if anything changes.    Medication Adjustments/Labs and Tests Ordered: Current medicines are reviewed at length with the patient today.  Concerns regarding medicines are outlined above.  Orders Placed This Encounter  Procedures   ECHOCARDIOGRAM COMPLETE   Meds ordered this encounter  Medications   labetalol (NORMODYNE) 100 MG tablet    Sig: Take 1 tablet (100 mg total) by mouth 2 (two) times daily.    Dispense:  60 tablet    Refill:  11    Patient Instructions  Medication Instructions:  Start Labetalol 100 mg Two Times Daily   *If you need a refill on your cardiac medications before your next appointment, please call your pharmacy*   Lab Work: NONE   If you have labs (blood work) drawn today and your tests are completely normal, you will receive your results only by: MyChart Message (if you have MyChart) OR A paper copy in the mail If you have any lab test that is abnormal or we need to change your treatment, we will call you to review the results.   Testing/Procedures: Your physician has requested that you have an echocardiogram. Echocardiography is a painless test that uses sound waves to create images of your heart. It provides your doctor with information about the size and shape of your heart and how  well your heart's chambers and valves are working. This procedure takes approximately one hour. There are no restrictions for this procedure. Please do NOT wear cologne, perfume, aftershave, or lotions (deodorant is allowed). Please arrive 15 minutes prior to your appointment time.    Follow-Up: At [facility], you and your health needs are our priority.  As part of our continuing mission to provide you with exceptional heart care, we have created designated Provider Care Teams.  These Care Teams include your primary Cardiologist (physician) and Advanced Practice Providers (APPs -  Physician Assistants and Nurse Practitioners) who all work together to provide you with the care you need, when you need it.  We recommend signing up for the patient portal called "MyChart".  Sign up information is provided on this After Visit Summary.  MyChart is used to connect with patients for Virtual Visits (Telemedicine).  Patients are able to view lab/test results, encounter notes, upcoming appointments, etc.  Non-urgent messages can be sent to your provider as well.   To learn more about what you can do with MyChart, go to [email or web address].    Your next appointment:   6 week(s)  The format for your next appointment:   In Person  Provider:   Sharlene Dory, NP    Other Instructions Thank you for choosing [facility]!    Important Information About Sugar         Signed, Sharlene Dory, NP  02/17/2022 8:19 PM    [facility]

## 2022-02-17 ENCOUNTER — Encounter: Payer: Self-pay | Admitting: Nurse Practitioner

## 2022-02-22 ENCOUNTER — Ambulatory Visit (INDEPENDENT_AMBULATORY_CARE_PROVIDER_SITE_OTHER): Payer: Medicare Other | Admitting: Orthopedic Surgery

## 2022-02-22 ENCOUNTER — Encounter: Payer: Self-pay | Admitting: Orthopedic Surgery

## 2022-02-22 DIAGNOSIS — Z89431 Acquired absence of right foot: Secondary | ICD-10-CM

## 2022-02-22 DIAGNOSIS — D638 Anemia in other chronic diseases classified elsewhere: Secondary | ICD-10-CM | POA: Diagnosis not present

## 2022-02-22 DIAGNOSIS — D508 Other iron deficiency anemias: Secondary | ICD-10-CM | POA: Diagnosis not present

## 2022-02-22 DIAGNOSIS — I129 Hypertensive chronic kidney disease with stage 1 through stage 4 chronic kidney disease, or unspecified chronic kidney disease: Secondary | ICD-10-CM | POA: Diagnosis not present

## 2022-02-22 DIAGNOSIS — R809 Proteinuria, unspecified: Secondary | ICD-10-CM | POA: Diagnosis not present

## 2022-02-22 DIAGNOSIS — E1129 Type 2 diabetes mellitus with other diabetic kidney complication: Secondary | ICD-10-CM | POA: Diagnosis not present

## 2022-02-22 DIAGNOSIS — N189 Chronic kidney disease, unspecified: Secondary | ICD-10-CM | POA: Diagnosis not present

## 2022-02-22 DIAGNOSIS — E1122 Type 2 diabetes mellitus with diabetic chronic kidney disease: Secondary | ICD-10-CM | POA: Diagnosis not present

## 2022-02-22 NOTE — Progress Notes (Signed)
Office Visit Note   Patient: [name]           Date of Birth: 08/15/1950           MRN: [id number] Visit Date: 02/22/2022              Requested by: Sharilyn Sites, [place],  [place] [address] PCP: Sharilyn Sites, MD  Chief Complaint  Patient presents with   Right Foot - Routine Post Op    02/04/22 right transmet amputation      HPI: Patient is a 72 year old gentleman who is 2 weeks status post right transmetatarsal amputation.  Patient is currently cleansing with soap and water 4 x 4 Ace wrap's he states he is trying to minimize his weightbearing.  Presents today full weightbearing in a postoperative shoe.  Assessment & Plan: Visit Diagnoses:  1. History of transmetatarsal amputation of right foot ([place])     Plan: Continue with wound care and minimizing weightbearing.  Follow-Up Instructions: Return in about 2 weeks (around 03/08/2022).   Ortho Exam  Patient is alert, oriented, no adenopathy, well-dressed, normal affect, normal respiratory effort. Examination the wound has slight maceration medially.  The wound edges are well-approximated the redness dorsally is resolving.  No ischemic changes.  Imaging: No results found.    Labs: Lab Results  Component Value Date   HGBA1C 6.8 (H) 11/12/2021   HGBA1C 8.6 (H) 01/09/2019   ESRSEDRATE 107 (H) 11/30/2021   ESRSEDRATE 44 (H) 11/11/2021   CRP 22.9 (H) 11/30/2021   CRP 8.8 (H) 11/11/2021   LABURIC 6.9 01/09/2019   REPTSTATUS 12/05/2021 FINAL 12/02/2021   GRAMSTAIN  12/02/2021    ABUNDANT WBC PRESENT,BOTH PMN AND MONONUCLEAR ABUNDANT GRAM NEGATIVE RODS FEW GRAM POSITIVE COCCI IN PAIRS IN CHAINS    CULT  12/02/2021    MODERATE ENTEROCOCCUS FAECALIS MODERATE STREPTOCOCCUS MITIS/ORALIS RARE PROTEUS PENNERI MODERATE PREVOTELLA BIVIA BETA LACTAMASE POSITIVE Performed at [facility], [place] [address] [place], [place] [address]    LABORGA ENTEROCOCCUS FAECALIS 12/02/2021   LABORGA  STREPTOCOCCUS MITIS/ORALIS 12/02/2021   LABORGA PROTEUS PENNERI 12/02/2021     Lab Results  Component Value Date   ALBUMIN 2.1 (L) 12/03/2021   ALBUMIN 2.0 (L) 12/02/2021   ALBUMIN 2.2 (L) 12/01/2021    Lab Results  Component Value Date   MG 2.2 12/07/2021   MG 2.0 12/03/2021   MG 1.9 11/30/2021   No results found for: "VD25OH"  No results found for: "PREALBUMIN"    Latest Ref Rng & Units 02/04/2022    9:39 AM 02/04/2022    9:23 AM 12/07/2021    5:30 AM  CBC EXTENDED  WBC 4.0 - 10.5 K/uL  5.8  14.2   RBC 4.22 - 5.81 MIL/uL  4.42  3.63   Hemoglobin 13.0 - 17.0 g/dL 12.6  12.0  10.2   HCT 39.0 - 52.0 % 37.0  36.9  31.4   Platelets 150 - 400 K/uL  236  462      There is no height or weight on file to calculate BMI.  Orders:  No orders of the defined types were placed in this encounter.  No orders of the defined types were placed in this encounter.    Procedures: No procedures performed  Clinical Data: No additional findings.  ROS:  All other systems negative, except as noted in the HPI. Review of Systems  Objective: Vital Signs: There were no vitals taken for this visit.  Specialty Comments:  No specialty comments available.  PMFS History: Patient Active Problem List   Diagnosis Date Noted   Cutaneous abscess of right foot 02/04/2022   Osteomyelitis of great toe of left foot ([place])    Hyperlipidemia 11/30/2021   Bedbug bite with infection 11/30/2021    Class: Acute   AKI (acute kidney injury) ([place]) 11/12/2021   Sepsis ([place]) 11/11/2021   Amputated great toe of right foot ([place]) 02/20/2019   Stage 3a chronic kidney disease ([place]) 02/13/2019   Osteomyelitis ([place])    DM (diabetes mellitus) ([place]) 01/30/2019   Fatty liver 01/09/2019   Thrombocytopenia, unspecified ([place]) 05/14/2013   Other specified abnormal findings of blood chemistry 05/14/2013   Colon adenoma 09/13/2012   Anxiety 09/06/2012   Mental retardation 09/06/2012   Chest pain 09/06/2012    Gastroesophageal reflux disease 03/08/2012   Gout 09/06/2011   Hypertension 03/08/2011   Chronic kidney disease 11/30/2009   Urinary tract infection 11/30/2009   Past Medical History:  Diagnosis Date   Anemia    Anxiety    Arthritis    Colon polyps    Depression    Diabetes mellitus without complication (HCC)    DM (diabetes mellitus) ([place]) 01/30/2019   Fatty liver    GERD (gastroesophageal reflux disease)    Gout    High cholesterol    History of echocardiogram 09/14/2011   EF >55%; borderline concentric LVH;    History of stress test 06/21/2010   exercise; normal study   Hypertension    MET TEST 08/15/2011   moderate peak VO2 limitation at 66% predicted; mod cardiac impairment with low SV & low anaerobic threshold, mild chronotropic incompetence; low risk   Nausea    Osteomyelitis (HCC)    right great toe   Renal disorder    Renal insufficiency    Speech impediment    Thrombocytopenia (HCC)    PMH    Family History  Problem Relation Age of Onset   Colon cancer Brother     Past Surgical History:  Procedure Laterality Date   AMPUTATION Right 02/13/2019   Procedure: RIGHT GREAT TOE AMPUTATION;  Surgeon: [name], MD;  Location: [place];  Service: Orthopedics;  Laterality: Right;   AMPUTATION Right 02/04/2022   Procedure: RIGHT TRANSMETATARSAL AMPUTATION APPLICATION OF WOUND VAC;  Surgeon: [name], MD;  Location: [place];  Service: Orthopedics;  Laterality: Right;   AMPUTATION TOE Left 12/02/2021   Procedure: AMPUTATION TOE;  Surgeon: Aviva Signs, MD;  Location[facility];  Service: General;  Laterality: Left;   AMPUTATION TOE Left 12/06/2021   Procedure: CLOSURE OF LEFT GREAT TOE AMPUTATION;  Surgeon: Aviva Signs, MD;  Location: [facility];  Service: General;  Laterality: Left;   CHOLECYSTECTOMY     COLONOSCOPY N/A 09/21/2012   Procedure: COLONOSCOPY;  Surgeon: Rogene Houston, MD;  [facility];  Service: Endoscopy;  Laterality: N/A;  730    COLONOSCOPY N/A 01/30/2019   rehman, eight 4-7 mm polyps in rectum in sigmoid and at splenic flexure, transverse colon and in cecum, one 72m polyp in recto-sigmoid colon, clip was placed, nine polyps total removed   ESOPHAGOGASTRODUODENOSCOPY  11/2005   normal mucosa and GE junction   HERNIA REPAIR     x 2   HIP SURGERY     Rt hip age 72  NASAL POLYP SURGERY      2-3 months ago   POLYPECTOMY  01/30/2019   Procedure: POLYPECTOMY;  Surgeon: RRogene Houston MD;  [facility];  Service: Endoscopy;;  proximal transverse colon, cecal   Social History   Occupational History   Not on file  Tobacco Use   Smoking status: Never   Smokeless tobacco: Never  Vaping Use   Vaping Use: Never used  Substance and Sexual Activity   Alcohol use: No   Drug use: No   Sexual activity: Not on file

## 2022-02-28 ENCOUNTER — Ambulatory Visit: Payer: Medicare Other | Attending: Nurse Practitioner

## 2022-02-28 ENCOUNTER — Other Ambulatory Visit: Payer: Self-pay | Admitting: Nurse Practitioner

## 2022-02-28 DIAGNOSIS — R011 Cardiac murmur, unspecified: Secondary | ICD-10-CM

## 2022-02-28 DIAGNOSIS — I10 Essential (primary) hypertension: Secondary | ICD-10-CM

## 2022-02-28 DIAGNOSIS — E785 Hyperlipidemia, unspecified: Secondary | ICD-10-CM

## 2022-02-28 DIAGNOSIS — Z8739 Personal history of other diseases of the musculoskeletal system and connective tissue: Secondary | ICD-10-CM

## 2022-02-28 LAB — ECHOCARDIOGRAM COMPLETE
AV Peak grad: 13.1 mmHg
Ao pk vel: 1.81 m/s
Area-P 1/2: 3.34 cm2
Calc EF: 68.3 %
MV M vel: 1.6 m/s
MV Peak grad: 10.2 mmHg
S' Lateral: 2.7 cm
Single Plane A2C EF: 68.6 %
Single Plane A4C EF: 67.9 %

## 2022-03-08 ENCOUNTER — Encounter: Payer: Self-pay | Admitting: Family

## 2022-03-08 ENCOUNTER — Ambulatory Visit (INDEPENDENT_AMBULATORY_CARE_PROVIDER_SITE_OTHER): Payer: 59 | Admitting: Family

## 2022-03-08 DIAGNOSIS — Z89431 Acquired absence of right foot: Secondary | ICD-10-CM

## 2022-03-08 MED ORDER — SULFAMETHOXAZOLE-TRIMETHOPRIM 800-160 MG PO TABS: 1.0000 | ORAL_TABLET | Freq: Two times a day (BID) | ORAL | 0 refills | Status: DC

## 2022-03-08 NOTE — Progress Notes (Signed)
Post-Op Visit Note   Patient: [name]           Date of Birth: 1950/05/02           MRN: [id number] Visit Date: 03/08/2022 PCP: Sharilyn Sites, MD  Chief Complaint:  Chief Complaint  Patient presents with   Right Foot - Routine Post Op    02/04/22 right transmet amputation    HPI:  HPI The patient is a 72 year old gentleman who presents status post right transmetatarsal amputation December 22 has been doing daily cleansing dry dressings minimizing weightbearing Ortho Exam On examination of the right foot the transmetatarsal amputation is healing well sutures are in place the medial column there is 1 remaining area that has puckered a little this is less than a centimeter in length open about 2 mm with granulation there is no depth.  There is some mild surrounding erythema  Visit Diagnoses:  1. History of transmetatarsal amputation of right foot ([place])     Plan: Sutures harvested today.  Continue daily dose of cleansing dry dressings minimize weightbearing follow-up in 2 weeks  Placed on course of Bactrim  Follow-Up Instructions: No follow-ups on file.   Imaging: No results found.  Orders:  No orders of the defined types were placed in this encounter.  No orders of the defined types were placed in this encounter.    PMFS History: Patient Active Problem List   Diagnosis Date Noted   Cutaneous abscess of right foot 02/04/2022   Osteomyelitis of great toe of left foot ([place])    Hyperlipidemia 11/30/2021   Bedbug bite with infection 11/30/2021    Class: Acute   AKI (acute kidney injury) ([place]) 11/12/2021   Sepsis ([place]) 11/11/2021   Amputated great toe of right foot ([place]) 02/20/2019   Stage 3a chronic kidney disease ([place]) 02/13/2019   Osteomyelitis ([place])    DM (diabetes mellitus) ([place]) 01/30/2019   Fatty liver 01/09/2019   Thrombocytopenia, unspecified ([place]) 05/14/2013   Other specified abnormal findings of blood chemistry 05/14/2013   Colon adenoma  09/13/2012   Anxiety 09/06/2012   Mental retardation 09/06/2012   Chest pain 09/06/2012   Gastroesophageal reflux disease 03/08/2012   Gout 09/06/2011   Hypertension 03/08/2011   Chronic kidney disease 11/30/2009   Urinary tract infection 11/30/2009   Past Medical History:  Diagnosis Date   Anemia    Anxiety    Arthritis    Colon polyps    Depression    Diabetes mellitus without complication (HCC)    DM (diabetes mellitus) ([place]) 01/30/2019   Fatty liver    GERD (gastroesophageal reflux disease)    Gout    High cholesterol    History of echocardiogram 09/14/2011   EF >55%; borderline concentric LVH;    History of stress test 06/21/2010   exercise; normal study   Hypertension    MET TEST 08/15/2011   moderate peak VO2 limitation at 66% predicted; mod cardiac impairment with low SV & low anaerobic threshold, mild chronotropic incompetence; low risk   Nausea    Osteomyelitis (HCC)    right great toe   Renal disorder    Renal insufficiency    Speech impediment    Thrombocytopenia (HCC)    PMH    Family History  Problem Relation Age of Onset   Colon cancer Brother     Past Surgical History:  Procedure Laterality Date   AMPUTATION Right 02/13/2019   Procedure: RIGHT GREAT TOE AMPUTATION;  Surgeon: [name], MD;  Location: [facility];  Service: Orthopedics;  Laterality: Right;   AMPUTATION Right 02/04/2022   Procedure: RIGHT TRANSMETATARSAL AMPUTATION APPLICATION OF WOUND VAC;  Surgeon: [name], MD;  Location: [facility];  Service: Orthopedics;  Laterality: Right;   AMPUTATION TOE Left 12/02/2021   Procedure: AMPUTATION TOE;  Surgeon: Aviva Signs, MD;  [facility];  Service: General;  Laterality: Left;   AMPUTATION TOE Left 12/06/2021   Procedure: CLOSURE OF LEFT GREAT TOE AMPUTATION;  Surgeon: Aviva Signs, MD;  [facility];  Service: General;  Laterality: Left;   CHOLECYSTECTOMY     COLONOSCOPY N/A 09/21/2012   Procedure: COLONOSCOPY;  Surgeon: Rogene Houston, MD;  [facility];  Service: Endoscopy;  Laterality: N/A;  730   COLONOSCOPY N/A 01/30/2019   rehman, eight 4-7 mm polyps in rectum in sigmoid and at splenic flexure, transverse colon and in cecum, one 53m polyp in recto-sigmoid colon, clip was placed, nine polyps total removed   ESOPHAGOGASTRODUODENOSCOPY  11/2005   normal mucosa and GE junction   HERNIA REPAIR     x 2   HIP SURGERY     Rt hip age 72  NASAL POLYP SURGERY      2-3 months ago   POLYPECTOMY  01/30/2019   Procedure: POLYPECTOMY;  Surgeon: RRogene Houston MD;  [facility];  Service: Endoscopy;;  proximal transverse colon, cecal   Social History   Occupational History   Not on file  Tobacco Use   Smoking status: Never   Smokeless tobacco: Never  Vaping Use   Vaping Use: Never used  Substance and Sexual Activity   Alcohol use: No   Drug use: No   Sexual activity: Not on file

## 2022-03-16 DIAGNOSIS — N1831 Chronic kidney disease, stage 3a: Secondary | ICD-10-CM | POA: Diagnosis not present

## 2022-03-16 DIAGNOSIS — E1165 Type 2 diabetes mellitus with hyperglycemia: Secondary | ICD-10-CM | POA: Diagnosis not present

## 2022-03-22 ENCOUNTER — Ambulatory Visit: Payer: 59 | Admitting: Orthopedic Surgery

## 2022-03-31 ENCOUNTER — Ambulatory Visit: Payer: 59 | Admitting: Orthopedic Surgery

## 2022-04-06 ENCOUNTER — Telehealth: Payer: Self-pay | Admitting: *Deleted

## 2022-04-06 ENCOUNTER — Ambulatory Visit (INDEPENDENT_AMBULATORY_CARE_PROVIDER_SITE_OTHER): Payer: 59 | Admitting: Family

## 2022-04-06 ENCOUNTER — Encounter: Payer: Self-pay | Admitting: Family

## 2022-04-06 DIAGNOSIS — Z89431 Acquired absence of right foot: Secondary | ICD-10-CM | POA: Insufficient documentation

## 2022-04-06 NOTE — Progress Notes (Signed)
  Care Coordination Note  04/06/2022 Name: DARTANIAN FAHRENKRUG MRN: [id number] DOB: May 01, 1950  CHRISTIE BADAMI is a 72 y.o. year old male who is a primary care patient of Sharilyn Sites, MD and is actively engaged with the care management team. I reached out to [place] by phone today to assist with re-scheduling a follow up visit with the RN Case Manager  Follow up plan: Unsuccessful telephone outreach attempt made. A HIPAA compliant phone message was left for the patient providing contact information and requesting a return call.   [place]  Direct Dial: [phone]

## 2022-04-06 NOTE — Progress Notes (Signed)
Post-Op Visit Note   Patient: [name]           Date of Birth: 1950-04-12           MRN: [id number] Visit Date: 04/06/2022 PCP: Sharilyn Sites, MD  Chief Complaint:  Chief Complaint  Patient presents with   Right Foot - Routine Post Op    02/04/22 right transmet amputation    HPI:  HPI The patient is a 72 year old gentleman who is seen status post right transmetatarsal amputation December 22 Ortho Exam On examination of the right foot over the medial border there is 1 small open area this is 8 mm x 2 mm filled in with slough.  There is no surrounding erythema maceration or sign of infection this does not probe  Visit Diagnoses: No diagnosis found.  Plan: Continue daily dose of cleansing.  May shower.  Antibacterial ointment and a dressing.  May advance to regular shoewear.  Follow-Up Instructions: No follow-ups on file.   Imaging: No results found.  Orders:  No orders of the defined types were placed in this encounter.  No orders of the defined types were placed in this encounter.    PMFS History: Patient Active Problem List   Diagnosis Date Noted   Cutaneous abscess of right foot 02/04/2022   Osteomyelitis of great toe of left foot ([place])    Hyperlipidemia 11/30/2021   Bedbug bite with infection 11/30/2021    Class: Acute   AKI (acute kidney injury) ([place]) 11/12/2021   Sepsis ([place]) 11/11/2021   Amputated great toe of right foot ([place]) 02/20/2019   Stage 3a chronic kidney disease ([place]) 02/13/2019   Osteomyelitis ([place])    DM (diabetes mellitus) ([place]) 01/30/2019   Fatty liver 01/09/2019   Thrombocytopenia, unspecified ([place]) 05/14/2013   Other specified abnormal findings of blood chemistry 05/14/2013   Colon adenoma 09/13/2012   Anxiety 09/06/2012   Mental retardation 09/06/2012   Chest pain 09/06/2012   Gastroesophageal reflux disease 03/08/2012   Gout 09/06/2011   Hypertension 03/08/2011   Chronic kidney disease 11/30/2009   Urinary tract infection  11/30/2009   Past Medical History:  Diagnosis Date   Anemia    Anxiety    Arthritis    Colon polyps    Depression    Diabetes mellitus without complication (HCC)    DM (diabetes mellitus) ([place]) 01/30/2019   Fatty liver    GERD (gastroesophageal reflux disease)    Gout    High cholesterol    History of echocardiogram 09/14/2011   EF >55%; borderline concentric LVH;    History of stress test 06/21/2010   exercise; normal study   Hypertension    MET TEST 08/15/2011   moderate peak VO2 limitation at 66% predicted; mod cardiac impairment with low SV & low anaerobic threshold, mild chronotropic incompetence; low risk   Nausea    Osteomyelitis (HCC)    right great toe   Renal disorder    Renal insufficiency    Speech impediment    Thrombocytopenia (HCC)    PMH    Family History  Problem Relation Age of Onset   Colon cancer Brother     Past Surgical History:  Procedure Laterality Date   AMPUTATION Right 02/13/2019   Procedure: RIGHT GREAT TOE AMPUTATION;  Surgeon: [name], MD;  Location: [facility];  Service: Orthopedics;  Laterality: Right;   AMPUTATION Right 02/04/2022   Procedure: RIGHT TRANSMETATARSAL AMPUTATION APPLICATION OF WOUND VAC;  Surgeon: [name], MD;  Location:  [facility];  Service: Orthopedics;  Laterality: Right;   AMPUTATION TOE Left 12/02/2021   Procedure: AMPUTATION TOE;  Surgeon: Aviva Signs, MD;  [facility];  Service: General;  Laterality: Left;   AMPUTATION TOE Left 12/06/2021   Procedure: CLOSURE OF LEFT GREAT TOE AMPUTATION;  Surgeon: Aviva Signs, MD;  [facility];  Service: General;  Laterality: Left;   CHOLECYSTECTOMY     COLONOSCOPY N/A 09/21/2012   Procedure: COLONOSCOPY;  Surgeon: Rogene Houston, MD;  [facility];  Service: Endoscopy;  Laterality: N/A;  730   COLONOSCOPY N/A 01/30/2019   rehman, eight 4-7 mm polyps in rectum in sigmoid and at splenic flexure, transverse colon and in cecum, one 67m polyp in  recto-sigmoid colon, clip was placed, nine polyps total removed   ESOPHAGOGASTRODUODENOSCOPY  11/2005   normal mucosa and GE junction   HERNIA REPAIR     x 2   HIP SURGERY     Rt hip age 89  NASAL POLYP SURGERY      2-3 months ago   POLYPECTOMY  01/30/2019   Procedure: POLYPECTOMY;  Surgeon: RRogene Houston MD;  [facility];  Service: Endoscopy;;  proximal transverse colon, cecal   Social History   Occupational History   Not on file  Tobacco Use   Smoking status: Never   Smokeless tobacco: Never  Vaping Use   Vaping Use: Never used  Substance and Sexual Activity   Alcohol use: No   Drug use: No   Sexual activity: Not on file

## 2022-04-07 ENCOUNTER — Encounter: Payer: Self-pay | Admitting: Nurse Practitioner

## 2022-04-07 ENCOUNTER — Ambulatory Visit: Payer: 59 | Attending: Nurse Practitioner | Admitting: Nurse Practitioner

## 2022-04-07 VITALS — BP 161/79 | HR 81 | Ht 77.0 in | Wt 222.0 lb

## 2022-04-07 DIAGNOSIS — R011 Cardiac murmur, unspecified: Secondary | ICD-10-CM | POA: Diagnosis not present

## 2022-04-07 DIAGNOSIS — E785 Hyperlipidemia, unspecified: Secondary | ICD-10-CM

## 2022-04-07 DIAGNOSIS — I1A0 Resistant hypertension: Secondary | ICD-10-CM

## 2022-04-07 DIAGNOSIS — Z8739 Personal history of other diseases of the musculoskeletal system and connective tissue: Secondary | ICD-10-CM

## 2022-04-07 MED ORDER — HYDRALAZINE HCL 25 MG PO TABS: 25.0000 mg | ORAL_TABLET | Freq: Two times a day (BID) | ORAL | 3 refills | Status: DC

## 2022-04-07 MED ORDER — METOPROLOL SUCCINATE ER 100 MG PO TB24: 100.0000 mg | ORAL_TABLET | Freq: Every day | ORAL | 3 refills | Status: DC

## 2022-04-07 NOTE — Progress Notes (Signed)
Cardiology Office Note:    Date: 04/07/2022  ID:  ROBERTT Grant, DOB 19-Apr-1950, MRN [id number]  PCP:  Sharilyn Sites, MD   [facility] Providers Cardiologist:  Carlyle Dolly, MD     Referring MD: Sharilyn Sites, MD   CC: Here for follow-up  History of Present Illness:    [name] is a 72 y.o. male with a hx of the following:  Atypical chest pain Hyperlipidemia Type 2 diabetes GERD Gout Anemia CKD stage III (followed by Dr. [name]) Mental handicap Hx of osteomyelitis   Patient is a 72 year old male with past medical history as mentioned above.  In 2012 he underwent nuclear stress test that was negative for ischemia.  Repeat nuclear stress test performed at [facility] in 2019 that was negative for ischemia and CT of the chest was negative for anything acute.  Has history of prior amputation of right great toe for osteomyelitis.  Last seen by Dr. Carlyle Dolly on September 29, 2020.  Patient noted occasional shortness of breath with exertion, stable for him.  Denied any chest pain.  His nephrologist changed carvedilol to labetalol for better blood pressure effects.  Blood pressure was elevated in clinic at 140/70 and was recommended to monitor BP at home to evaluate for component of whitecoat hypertension.  Leg edema was noted on exam, trace bilateral edema noted.  This was considered mild and asymptomatic.  Dr. Carlyle Dolly recommended that if this were to progress, would need repeat echocardiogram and consider diuretic.  No medications were added or changed.  Was told to follow-up in 1 year.  02/16/2022 - Saw him for 1 year follow-up with his relative. Patient is a poor hisotiran d/t mental handicap. Relative states he had more amputations along bilateral feet. Was overall doing well from a cardiac perspective. Updated Echo revealed normal EF. Mild LVH,trivial MR, no significant valvular abnormalities.   04/07/2022 - Doing well. BP continues to be  elevated. Relative wonders about compliance. Overall doing well from a cardiac perspective. Pt notes right shoulder pain, relative states MD thinks he has bursitis, denies any chest pain. Denies any shortness of breath, palpitations, syncope, presyncope, dizziness, orthopnea, PND, swelling or significant weight changes, acute bleeding, or claudication.  SH: Enjoys spending time outdoors.    Past Medical History:  Diagnosis Date   Anemia    Anxiety    Arthritis    Colon polyps    Depression    Diabetes mellitus without complication (HCC)    DM (diabetes mellitus) ([place]) 01/30/2019   Fatty liver    GERD (gastroesophageal reflux disease)    Gout    High cholesterol    History of echocardiogram 09/14/2011   EF >55%; borderline concentric LVH;    History of stress test 06/21/2010   exercise; normal study   Hypertension    MET TEST 08/15/2011   moderate peak VO2 limitation at 66% predicted; mod cardiac impairment with low SV & low anaerobic threshold, mild chronotropic incompetence; low risk   Nausea    Osteomyelitis (HCC)    right great toe   Renal disorder    Renal insufficiency    Speech impediment    Thrombocytopenia ([place])    PMH    Past Surgical History:  Procedure Laterality Date   AMPUTATION Right 02/13/2019   Procedure: RIGHT GREAT TOE AMPUTATION;  Surgeon: [name], MD;  Location: [facility];  Service: Orthopedics;  Laterality: Right;   AMPUTATION Right 02/04/2022   Procedure: RIGHT TRANSMETATARSAL AMPUTATION  APPLICATION OF WOUND VAC;  Surgeon: [name], MD;  Location: [facility];  Service: Orthopedics;  Laterality: Right;   AMPUTATION TOE Left 12/02/2021   Procedure: AMPUTATION TOE;  Surgeon: Aviva Signs, MD;  [facility];  Service: General;  Laterality: Left;   AMPUTATION TOE Left 12/06/2021   Procedure: CLOSURE OF LEFT GREAT TOE AMPUTATION;  Surgeon: Aviva Signs, MD;  [facility];  Service: General;  Laterality: Left;   CHOLECYSTECTOMY     COLONOSCOPY  N/A 09/21/2012   Procedure: COLONOSCOPY;  Surgeon: Rogene Houston, MD;  [facility];  Service: Endoscopy;  Laterality: N/A;  730   COLONOSCOPY N/A 01/30/2019   rehman, eight 4-7 mm polyps in rectum in sigmoid and at splenic flexure, transverse colon and in cecum, one 76m polyp in recto-sigmoid colon, clip was placed, nine polyps total removed   ESOPHAGOGASTRODUODENOSCOPY  11/2005   normal mucosa and GE junction   HERNIA REPAIR     x 2   HIP SURGERY     Rt hip age 72  NASAL POLYP SURGERY      2-3 months ago   POLYPECTOMY  01/30/2019   Procedure: POLYPECTOMY;  Surgeon: RRogene Houston MD;  [facility];  Service: Endoscopy;;  proximal transverse colon, cecal    Current Medications: Current Meds  Medication Sig   ACCU-CHEK GUIDE test strip USE AS DIRECTED TO TEST TWICE DAILY   acetaminophen (TYLENOL) 500 MG tablet Take 500-1,000 mg by mouth every 6 (six) hours as needed for mild pain or headache. Pain   atorvastatin (LIPITOR) 10 MG tablet Take 10 mg by mouth at bedtime.   B Complex-C (B-COMPLEX WITH VITAMIN C) tablet Take 1 tablet by mouth daily.   buPROPion (WELLBUTRIN XL) 150 MG 24 hr tablet Take 150 mg by mouth at bedtime.    cetirizine (ZYRTEC) 10 MG tablet Take 10 mg by mouth daily as needed for allergies.   Cholecalciferol (VITAMIN D) 50 MCG (2000 UT) CAPS Take 2,000 Units by mouth daily.    doxycycline (VIBRA-TABS) 100 MG tablet Take 1 tablet (100 mg total) by mouth 2 (two) times daily.   ferrous sulfate 325 (65 FE) MG EC tablet Take 325 mg by mouth daily with breakfast.   HYDROcodone-acetaminophen (NORCO/VICODIN) 5-325 MG tablet Take 1 tablet by mouth every 4 (four) hours as needed.   loperamide (IMODIUM) 2 MG capsule TAKE ONE CAPSULE BY MOUTH TWO TIMES DAILY AS NEEDED FOR DIARRHEA OR LOOSE STOOLS (Patient taking differently: Take 2 mg by mouth 2 (two) times daily as needed for diarrhea or loose stools.)   metoprolol succinate (TOPROL-XL) 100 MG 24 hr  tablet Take 1 tablet (100 mg total) by mouth daily. Take with or immediately following a meal.   Omega-3 Fatty Acids (FISH OIL) 1000 MG CAPS Take 1,000 mg by mouth 2 (two) times daily. Afternoon & bedtime.   omeprazole (PRILOSEC) 20 MG capsule TAKE ONE CAPSULE BY MOUTH DAILY. NEEDS OFFICE VISIT FOR FURTHER REFILLS.   pioglitazone (ACTOS) 30 MG tablet Take 30 mg by mouth daily.   sertraline (ZOLOFT) 100 MG tablet Take 100 mg by mouth at bedtime.    sodium bicarbonate 650 MG tablet Take 650 mg by mouth 2 (two) times daily.   sulfamethoxazole-trimethoprim (BACTRIM DS) 800-160 MG tablet Take 1 tablet by mouth 2 (two) times daily.   TOUJEO MAX SOLOSTAR 300 UNIT/ML Solostar Pen Inject 10 Units into the skin daily. (Patient taking differently: Inject 20 Units into the  skin daily.)   ULORIC 80 MG TABS Take 80 mg by mouth daily.    Vitamin D, Ergocalciferol, (DRISDOL) 1.25 MG (50000 UNIT) CAPS capsule Take 50,000 Units by mouth once a week. Sundays    labetalol (NORMODYNE) 100 MG tablet Take 1 tablet (100 mg total) by mouth 2 (two) times daily.     Allergies:   Ivp dye [iodinated contrast media] and Tetanus toxoids   Social History   Socioeconomic History   Marital status: Single    Spouse name: Not on file   Number of children: Not on file   Years of education: Not on file   Highest education level: Not on file  Occupational History   Not on file  Tobacco Use   Smoking status: Never   Smokeless tobacco: Never  Vaping Use   Vaping Use: Never used  Substance and Sexual Activity   Alcohol use: No   Drug use: No   Sexual activity: Not on file  Other Topics Concern   Not on file  Social History Narrative   Not on file   Social Determinants of Health   Financial Resource Strain: Low Risk  (12/13/2021)   Overall Financial Resource Strain (CARDIA)    Difficulty of Paying Living Expenses: Not hard at all  Food Insecurity: No Food Insecurity (11/12/2021)   Hunger Vital Sign    Worried  About Running Out of Food in the Last Year: Never true    [place] in the Last Year: Never true  Transportation Needs: No Transportation Needs (12/13/2021)   PRAPARE - Hydrologist (Medical): No    Lack of Transportation (Non-Medical): No  Physical Activity: Not on file  Stress: Not on file  Social Connections: Not on file     Family History: The patient's family history includes Colon cancer in his brother.  ROS:     Please see the history of present illness.    All other systems reviewed and are negative.  EKGs/Labs/Other Studies Reviewed:    The following studies were reviewed today:   EKG:  EKG is not ordered today.    Echocardiogram on 02/28/2022:    Normal Myoview on 05/22/2017.   1. Left ventricular ejection fraction, by estimation, is 65 to 70%. The  left ventricle has normal function. The left ventricle has no regional  wall motion abnormalities. There is mild left ventricular hypertrophy.  Left ventricular diastolic parameters  were normal. The average left ventricular global longitudinal strain is  -22.5 %. The global longitudinal strain is normal.   2. Right ventricular systolic function is normal. The right ventricular  size is normal. Tricuspid regurgitation signal is inadequate for assessing  PA pressure.   3. Left atrial size was upper normal.   4. The mitral valve is grossly normal. Trivial mitral valve  regurgitation.   5. The aortic valve is tricuspid with restricted motion of left coronary  cusp and mild annular calcification, but no stenosis. Aortic valve  regurgitation is not visualized.   6. The inferior vena cava is normal in size with greater than 50%  respiratory variability, suggesting right atrial pressure of 3 mmHg.   Comparison(s): No prior Echocardiogram.  Recent Labs: 11/11/2021: TSH 0.916 12/03/2021: ALT 19 12/07/2021: Magnesium 2.2 02/04/2022: BUN 20; Creatinine, Ser 1.50; Hemoglobin 12.6;  Platelets 236; Potassium 4.8; Sodium 138  Recent Lipid Panel No results found for: "CHOL", "TRIG", "HDL", "CHOLHDL", "VLDL", "LDLCALC", "LDLDIRECT"   Physical Exam:  VS:  BP (!) 161/79 (BP Location: Left Arm, Patient Position: Sitting)   Pulse 81   Ht '6\' 5"'$  (1.956 m)   Wt 222 lb (100.7 kg)   SpO2 97%   BMI 26.33 kg/m     Wt Readings from Last 3 Encounters:  04/07/22 222 lb (100.7 kg)  02/16/22 219 lb (99.3 kg)  02/04/22 225 lb (102.1 kg)     GEN: Well nourished, well developed in no acute distress HEENT: Normal NECK: No JVD; No carotid bruits CARDIAC: S1/S2, RRR, Grade 1/6 systolic murmur noted along LSB, no rubs or gallops noted; 2+ pulses  RESPIRATORY:  Clear to auscultation without rales, wheezing or rhonchi  MUSCULOSKELETAL:  No edema; No deformity  SKIN: Warm and dry NEUROLOGIC:  Alert and oriented x 3 PSYCHIATRIC:  Normal affect   ASSESSMENT:    1. Resistant hypertension   2. Hyperlipidemia, unspecified hyperlipidemia type   3. Cardiac murmur   4. History of osteomyelitis     PLAN:    In order of problems listed above:  Resistant HTN SBP elevated, not at goal. Discussed SBP goal < 130. Was on labetalol previously prescribed by Nephrologist.  Labetalol started at previous OV, however pt's relative wonders about compliance. Will stop Labetalol ans switch to long acting Toprol XL. Will also add Hydralazine 25 mg PO BID and may take extra tablet daily PRN for SBP > 140.  Discussed to monitor BP at home at least 2 hours after medications and sitting for 5-10 minutes. Will bring log to next visit. Heart healthy diet and regular cardiovascular exercise encouraged. Will r/o secondary causes for resistant HTN and arrange renal artery ultrasound. Not a good candidate for sleep study. Consider referral to advanced HTN clinic if no improvement by next OV.   HLD Lipid profile in 07/2021 showed normal LDL; however, high triglycerides and low HDL. Continue current medication  therapy. Continue to work on lifestyle changes before adjusting medications. Heart healthy diet and regular cardiovascular exercise encouraged.  Murmur Grade 1/6 murmur noted on exam. Recent Echo unremarkable for any significant valvular abnormalities. Will continue to monitor.    Hx of osteomyelitis Hx of osteomyelitis and has had toe amputations with more amputations along bilateral feet recently. Continue follow up with Orthopedics and PCP.      5. Disposition: Follow-up with me or APP in 2-3 months or sooner if anything changes.   Renal  Medication Adjustments/Labs and Tests Ordered: Current medicines are reviewed at length with the patient today.  Concerns regarding medicines are outlined above.  Orders Placed This Encounter  Procedures   VAS US RENAL ARTERY DUPLEX   Meds ordered this encounter  Medications   metoprolol succinate (TOPROL-XL) 100 MG 24 hr tablet    Sig: Take 1 tablet (100 mg total) by mouth daily. Take with or immediately following a meal.    Dispense:  30 tablet    Refill:  3    04/07/2022 NEW-stop labetalol   hydrALAZINE (APRESOLINE) 25 MG tablet    Sig: Take 1 tablet (25 mg total) by mouth in the morning and at bedtime. May take an additional 25 mg daily as needed for SBP consistently above 140    Dispense:  90 tablet    Refill:  3    04/07/2022 NEW    Patient Instructions  Medication Instructions:  Your physician has recommended you make the following change in your medication:  Stop labetalol Start metoprolol succinate 100 mg daily Start hydralazine 25 mg twice daily.  May take an additional 25 mg daily as needed for top blood pressure number staying consistently above 140.  Continue other medications the same  Labwork: none  Testing/Procedures: Your physician has requested that you have a renal artery duplex. During this test, an ultrasound is used to evaluate blood flow to the kidneys. Allow one hour for this exam. Do not eat after midnight the  day before and avoid carbonated beverages. Take your medications as you usually do.  Follow-Up: Your physician recommends that you schedule a follow-up appointment in: 2-3 months  Any Other Special Instructions Will Be Listed Below (If Applicable).   If you need a refill on your cardiac medications before your next appointment, please call your pharmacy.    Signed, Finis Bud, NP  04/11/2022 8:20 AM    [place]

## 2022-04-07 NOTE — Patient Instructions (Addendum)
Medication Instructions:  Your physician has recommended you make the following change in your medication:  Stop labetalol Start metoprolol succinate 100 mg daily Start hydralazine 25 mg twice daily. May take an additional 25 mg daily as needed for top blood pressure number staying consistently above 140.  Continue other medications the same  Labwork: none  Testing/Procedures: Your physician has requested that you have a renal artery duplex. During this test, an ultrasound is used to evaluate blood flow to the kidneys. Allow one hour for this exam. Do not eat after midnight the day before and avoid carbonated beverages. Take your medications as you usually do.  Follow-Up: Your physician recommends that you schedule a follow-up appointment in: 2-3 months  Any Other Special Instructions Will Be Listed Below (If Applicable).   If you need a refill on your cardiac medications before your next appointment, please call your pharmacy.

## 2022-04-08 DIAGNOSIS — M869 Osteomyelitis, unspecified: Secondary | ICD-10-CM | POA: Diagnosis not present

## 2022-04-08 DIAGNOSIS — L03119 Cellulitis of unspecified part of limb: Secondary | ICD-10-CM | POA: Diagnosis not present

## 2022-04-08 DIAGNOSIS — I10 Essential (primary) hypertension: Secondary | ICD-10-CM | POA: Diagnosis not present

## 2022-04-08 DIAGNOSIS — Z6824 Body mass index (BMI) 24.0-24.9, adult: Secondary | ICD-10-CM | POA: Diagnosis not present

## 2022-04-08 DIAGNOSIS — M7918 Myalgia, other site: Secondary | ICD-10-CM | POA: Diagnosis not present

## 2022-04-08 DIAGNOSIS — E1165 Type 2 diabetes mellitus with hyperglycemia: Secondary | ICD-10-CM | POA: Diagnosis not present

## 2022-04-08 DIAGNOSIS — N1831 Chronic kidney disease, stage 3a: Secondary | ICD-10-CM | POA: Diagnosis not present

## 2022-04-08 DIAGNOSIS — S98111S Complete traumatic amputation of right great toe, sequela: Secondary | ICD-10-CM | POA: Diagnosis not present

## 2022-04-08 DIAGNOSIS — E08621 Diabetes mellitus due to underlying condition with foot ulcer: Secondary | ICD-10-CM | POA: Diagnosis not present

## 2022-04-08 DIAGNOSIS — T148XXA Other injury of unspecified body region, initial encounter: Secondary | ICD-10-CM | POA: Diagnosis not present

## 2022-04-11 ENCOUNTER — Encounter: Payer: Self-pay | Admitting: Nurse Practitioner

## 2022-04-12 NOTE — Progress Notes (Signed)
  Care Coordination Note  04/12/2022 Name: [name] MRN: [id number] DOB: 03-14-1950  [name] is a 72 y.o. year old male who is a primary care patient of Sharilyn Sites, MD and is actively engaged with the care management team. I reached out to [facility] by phone today to assist with re-scheduling a follow up visit with the RN Case Manager  Follow up plan: Unsuccessful telephone outreach attempt made. A HIPAA compliant phone message was left for the patient providing contact information and requesting a return call. We have been unable to make contact with the patient for follow up.   [facility]  Direct Dial: [phone]

## 2022-04-27 ENCOUNTER — Ambulatory Visit: Payer: 59 | Admitting: Family

## 2022-05-10 ENCOUNTER — Ambulatory Visit: Payer: 59 | Admitting: Family

## 2022-05-13 DIAGNOSIS — D649 Anemia, unspecified: Secondary | ICD-10-CM | POA: Diagnosis not present

## 2022-05-13 DIAGNOSIS — D508 Other iron deficiency anemias: Secondary | ICD-10-CM | POA: Diagnosis not present

## 2022-05-13 DIAGNOSIS — E1129 Type 2 diabetes mellitus with other diabetic kidney complication: Secondary | ICD-10-CM | POA: Diagnosis not present

## 2022-05-13 DIAGNOSIS — E1122 Type 2 diabetes mellitus with diabetic chronic kidney disease: Secondary | ICD-10-CM | POA: Diagnosis not present

## 2022-05-13 DIAGNOSIS — E559 Vitamin D deficiency, unspecified: Secondary | ICD-10-CM | POA: Diagnosis not present

## 2022-05-13 DIAGNOSIS — N189 Chronic kidney disease, unspecified: Secondary | ICD-10-CM | POA: Diagnosis not present

## 2022-05-17 ENCOUNTER — Encounter (HOSPITAL_COMMUNITY): Payer: Self-pay

## 2022-05-17 ENCOUNTER — Other Ambulatory Visit: Payer: Self-pay

## 2022-05-17 ENCOUNTER — Emergency Department (HOSPITAL_COMMUNITY): Payer: 59

## 2022-05-17 ENCOUNTER — Emergency Department (HOSPITAL_COMMUNITY)
Admission: EM | Admit: 2022-05-17 | Discharge: 2022-05-17 | Disposition: A | Discharge status: Home / Self Care | Payer: 59 | Attending: Emergency Medicine | Admitting: Emergency Medicine

## 2022-05-17 DIAGNOSIS — E559 Vitamin D deficiency, unspecified: Secondary | ICD-10-CM | POA: Diagnosis not present

## 2022-05-17 DIAGNOSIS — M25511 Pain in right shoulder: Secondary | ICD-10-CM | POA: Diagnosis not present

## 2022-05-17 DIAGNOSIS — N289 Disorder of kidney and ureter, unspecified: Secondary | ICD-10-CM | POA: Diagnosis not present

## 2022-05-17 DIAGNOSIS — R809 Proteinuria, unspecified: Secondary | ICD-10-CM | POA: Diagnosis not present

## 2022-05-17 DIAGNOSIS — E611 Iron deficiency: Secondary | ICD-10-CM | POA: Diagnosis not present

## 2022-05-17 DIAGNOSIS — N189 Chronic kidney disease, unspecified: Secondary | ICD-10-CM | POA: Diagnosis not present

## 2022-05-17 DIAGNOSIS — E1122 Type 2 diabetes mellitus with diabetic chronic kidney disease: Secondary | ICD-10-CM | POA: Diagnosis not present

## 2022-05-17 DIAGNOSIS — E1165 Type 2 diabetes mellitus with hyperglycemia: Secondary | ICD-10-CM | POA: Diagnosis not present

## 2022-05-17 DIAGNOSIS — Z79899 Other long term (current) drug therapy: Secondary | ICD-10-CM | POA: Insufficient documentation

## 2022-05-17 DIAGNOSIS — I1A0 Resistant hypertension: Secondary | ICD-10-CM | POA: Diagnosis not present

## 2022-05-17 DIAGNOSIS — E860 Dehydration: Secondary | ICD-10-CM | POA: Insufficient documentation

## 2022-05-17 DIAGNOSIS — D638 Anemia in other chronic diseases classified elsewhere: Secondary | ICD-10-CM | POA: Diagnosis not present

## 2022-05-17 DIAGNOSIS — I160 Hypertensive urgency: Secondary | ICD-10-CM | POA: Diagnosis not present

## 2022-05-17 DIAGNOSIS — R030 Elevated blood-pressure reading, without diagnosis of hypertension: Secondary | ICD-10-CM | POA: Diagnosis present

## 2022-05-17 DIAGNOSIS — E1129 Type 2 diabetes mellitus with other diabetic kidney complication: Secondary | ICD-10-CM | POA: Diagnosis not present

## 2022-05-17 DIAGNOSIS — I10 Essential (primary) hypertension: Secondary | ICD-10-CM | POA: Diagnosis not present

## 2022-05-17 LAB — CBC
HCT: 37.6 % — ABNORMAL LOW (ref 39.0–52.0)
Hemoglobin: 12 g/dL — ABNORMAL LOW (ref 13.0–17.0)
MCH: 27.1 pg (ref 26.0–34.0)
MCHC: 31.9 g/dL (ref 30.0–36.0)
MCV: 84.9 fL (ref 80.0–100.0)
Platelets: 184 10*3/uL (ref 150–400)
RBC: 4.43 MIL/uL (ref 4.22–5.81)
RDW: 15.4 % (ref 11.5–15.5)
WBC: 5.8 10*3/uL (ref 4.0–10.5)
nRBC: 0 % (ref 0.0–0.2)

## 2022-05-17 LAB — URINALYSIS, ROUTINE W REFLEX MICROSCOPIC
Bacteria, UA: NONE SEEN
Bilirubin Urine: NEGATIVE
Glucose, UA: 50 mg/dL — AB
Hgb urine dipstick: NEGATIVE
Ketones, ur: NEGATIVE mg/dL
Leukocytes,Ua: NEGATIVE
Nitrite: NEGATIVE
Protein, ur: 30 mg/dL — AB
Specific Gravity, Urine: 1.012 (ref 1.005–1.030)
pH: 5 (ref 5.0–8.0)

## 2022-05-17 LAB — BASIC METABOLIC PANEL
Anion gap: 9 (ref 5–15)
BUN: 25 mg/dL — ABNORMAL HIGH (ref 8–23)
CO2: 24 mmol/L (ref 22–32)
Calcium: 9.2 mg/dL (ref 8.9–10.3)
Chloride: 103 mmol/L (ref 98–111)
Creatinine, Ser: 1.46 mg/dL — ABNORMAL HIGH (ref 0.61–1.24)
GFR, Estimated: 51 mL/min — ABNORMAL LOW (ref >60)
Glucose, Bld: 240 mg/dL — ABNORMAL HIGH (ref 70–99)
Potassium: 4.1 mmol/L (ref 3.5–5.1)
Sodium: 136 mmol/L (ref 135–145)

## 2022-05-17 LAB — TROPONIN I (HIGH SENSITIVITY)
Troponin I (High Sensitivity): 5 ng/L (ref <18)
Troponin I (High Sensitivity): 5 ng/L (ref <18)

## 2022-05-17 LAB — CBG MONITORING, ED: Glucose-Capillary: 264 mg/dL — ABNORMAL HIGH (ref 70–99)

## 2022-05-17 MED ORDER — HYDRALAZINE HCL 25 MG PO TABS
25.0000 mg | ORAL_TABLET | Freq: Once | ORAL | Status: AC
  Administered 2022-05-17: 25 mg via ORAL
  Filled 2022-05-17: qty 1

## 2022-05-17 MED ORDER — KETOROLAC TROMETHAMINE 15 MG/ML IJ SOLN
15.0000 mg | Freq: Once | INTRAMUSCULAR | Status: AC
  Administered 2022-05-17: 15 mg via INTRAVENOUS
  Filled 2022-05-17: qty 1

## 2022-05-17 MED ORDER — LACTATED RINGERS IV BOLUS
1000.0000 mL | Freq: Once | INTRAVENOUS | Status: AC
  Administered 2022-05-17: 1000 mL via INTRAVENOUS

## 2022-05-17 MED ORDER — METOPROLOL SUCCINATE ER 50 MG PO TB24
100.0000 mg | ORAL_TABLET | Freq: Once | ORAL | Status: AC
  Administered 2022-05-17: 100 mg via ORAL
  Filled 2022-05-17: qty 2

## 2022-05-17 NOTE — Discharge Instructions (Addendum)
You were seen in the emergency department for hypertension. It appears that you may not have taken your medications today as your symptoms improved after a dose of Toprol-XL and hydralazine were given here. Please try to take your medications consistently to avoid complications from unmanaged hypertension.

## 2022-05-17 NOTE — ED Triage Notes (Signed)
Pt presents with elevated B/P that was noted at the PCP. Pt denies CP, dizziness, or vision changes.

## 2022-05-17 NOTE — ED Notes (Signed)
Lab called this RN into the room. Patient's room covered in bed bugs. Patient states he has had this problem for awhile at home and already has terminx involved. Patient's clothes, shoes, sock double bagged at this time. Patient placed in hospital attire and new bedding given at this time.

## 2022-05-17 NOTE — ED Provider Notes (Signed)
[facility] Provider Note   CSN: [id number] Arrival date & time: 05/17/22  1153     History Chief Complaint  Patient presents with   Hypertension    [name] is a 72 y.o. male.  Patient presents emergency department complaints of hypertension.  Patient was reportedly a primary care provider earlier today and was brought in due to concern about hypertension being uncontrolled.  Patient is currently denying any chest pain, dizziness, vision changes, shortness of breath.  Patient has history significant mental retardation so on my examination of patient, unable to get a reasonable history from the patient's will attempt to contact family or relatives to get a better idea of what could be happening with patient.  Unsure if patient has been taking his blood pressure medications as prescribed.  Patient is currently prescribed Toprol XL 100 mg once daily as well as hydralazine 25 mg for breakthrough hypertension with systolic pressures consistently above 140.  Patient sees Dr. Theador Grant for nephrology.   Spoke with [name] (legal guardian) who expressed concerned about medication compliance as patient lives alone and there have been a number of times when she or someone else checks on patient and he has not been taking his medications as prescribed that have been set out for him in his pill box.   Hypertension       Home Medications Prior to Admission medications   Medication Sig Start Date End Date Taking? Authorizing Provider  acetaminophen (TYLENOL) 500 MG tablet Take 500-1,000 mg by mouth every 6 (six) hours as needed for mild pain or headache. Pain   Yes [provider]  atorvastatin (LIPITOR) 10 MG tablet Take 10 mg by mouth at bedtime. 09/21/17  Yes [provider]  B Complex-C (B-COMPLEX WITH VITAMIN C) tablet Take 1 tablet by mouth daily.   Yes [provider]  buPROPion (WELLBUTRIN XL) 150 MG 24 hr tablet  Take 150 mg by mouth at bedtime.  08/29/12  Yes [provider]  cetirizine (ZYRTEC) 10 MG tablet Take 10 mg by mouth daily as needed for allergies.   Yes [provider]  Cholecalciferol (VITAMIN D) 50 MCG (2000 UT) CAPS Take 2,000 Units by mouth daily.    Yes [provider]  ferrous sulfate 325 (65 FE) MG EC tablet Take 325 mg by mouth daily with breakfast.   Yes [provider]  hydrALAZINE (APRESOLINE) 25 MG tablet Take 1 tablet (25 mg total) by mouth in the morning and at bedtime. May take an additional 25 mg daily as needed for SBP consistently above 140 04/07/22  Yes Finis Bud, NP  loperamide (IMODIUM) 2 MG capsule TAKE ONE CAPSULE BY MOUTH TWO TIMES DAILY AS NEEDED FOR DIARRHEA OR LOOSE STOOLS 09/02/21  Yes Carlan, Chelsea L, NP  metoprolol succinate (TOPROL-XL) 100 MG 24 hr tablet Take 1 tablet (100 mg total) by mouth daily. Take with or immediately following a meal. 04/07/22  Yes Finis Bud, NP  Omega-3 Fatty Acids (FISH OIL) 1000 MG CAPS Take 1,000 mg by mouth 2 (two) times daily. Afternoon & bedtime.   Yes [provider]  omeprazole (PRILOSEC) 20 MG capsule TAKE ONE CAPSULE BY MOUTH DAILY. NEEDS OFFICE VISIT FOR FURTHER REFILLS. 01/24/22  Yes Carlan, Chelsea L, NP  sertraline (ZOLOFT) 100 MG tablet Take 100 mg by mouth at bedtime.  05/09/15  Yes [provider]  sodium bicarbonate 650 MG tablet Take 650 mg by mouth 2 (two)  times daily. 07/18/17  Yes [provider]  TOUJEO MAX SOLOSTAR 300 UNIT/ML Solostar Pen Inject 10 Units into the skin daily. Patient taking differently: Inject 20 Units into the skin daily. 11/14/21  Yes Grant, Marvin L, MD  ULORIC 80 MG TABS Take 80 mg by mouth daily.  04/21/15  Yes [provider]  ACCU-CHEK GUIDE test strip USE AS DIRECTED TO TEST TWICE DAILY 02/01/22   [provider]  ciprofloxacin (CIPRO) 500 MG tablet SMARTSIG:1 Tablet(s) By Mouth Every 12 Hours Patient not  taking: Reported on 05/17/2022 04/08/22   [provider]  clindamycin (CLEOCIN) 300 MG capsule Take 300 mg by mouth every 6 (six) hours. Patient not taking: Reported on 05/17/2022 04/08/22   [provider]  glipiZIDE (GLUCOTROL) 5 MG tablet Take 5 mg by mouth 2 (two) times daily. Patient not taking: Reported on 05/17/2022 05/05/22   [provider]  HYDROcodone-acetaminophen (NORCO/VICODIN) 5-325 MG tablet Take 1 tablet by mouth every 4 (four) hours as needed. Patient not taking: Reported on 05/17/2022 02/04/22   Marvin Minion, MD  labetalol (NORMODYNE) 100 MG tablet Take 100 mg by mouth 2 (two) times daily. Patient not taking: Reported on 05/17/2022 05/05/22   [provider]  NIFEdipine (ADALAT CC) 90 MG 24 hr tablet Take 90 mg by mouth daily. Patient not taking: Reported on 05/17/2022 12/06/21   [provider]  pioglitazone (ACTOS) 30 MG tablet Take 30 mg by mouth daily. Patient not taking: Reported on 05/17/2022 10/28/17   [provider]  sulfamethoxazole-trimethoprim (BACTRIM DS) 800-160 MG tablet Take 1 tablet by mouth 2 (two) times daily. Patient not taking: Reported on 05/17/2022 03/08/22   Marvin Slick, NP  Vitamin D, Ergocalciferol, (DRISDOL) 1.25 MG (50000 UNIT) CAPS capsule Take 50,000 Units by mouth once a week. Sundays Patient not taking: Reported on 05/17/2022 07/21/21   [provider]      Allergies    Ivp dye [iodinated contrast media] and Tetanus toxoids    Review of Systems   Review of Systems  Physical Exam Updated Vital Signs BP (!) 164/93   Pulse 89   Temp 98.4 F (36.9 C) (Oral)   Resp (!) 21   Ht 6\' 5"  (1.956 m)   Wt 95.3 kg   SpO2 98%   BMI 24.90 kg/m  Physical Exam  ED Results / Procedures / Treatments   Labs (all labs ordered are listed, but only abnormal results are displayed) Labs Reviewed  BASIC METABOLIC PANEL - Abnormal; Notable for the following components:      Result Value   Glucose, Bld 240  (*)    BUN 25 (*)    Creatinine, Ser 1.46 (*)    GFR, Estimated 51 (*)    All other components within normal limits  CBC - Abnormal; Notable for the following components:   Hemoglobin 12.0 (*)    HCT 37.6 (*)    All other components within normal limits  URINALYSIS, ROUTINE W REFLEX MICROSCOPIC - Abnormal; Notable for the following components:   Color, Urine STRAW (*)    Glucose, UA 50 (*)    Protein, ur 30 (*)    All other components within normal limits  CBG MONITORING, ED - Abnormal; Notable for the following components:   Glucose-Capillary 264 (*)    All other components within normal limits  TROPONIN I (HIGH SENSITIVITY)  TROPONIN I (HIGH SENSITIVITY)    EKG None  Radiology DG Shoulder Right Portable  Result Date:  05/17/2022 CLINICAL DATA:  Right shoulder pain for 2 weeks. No history of trauma EXAM: RIGHT SHOULDER - 6 VIEW COMPARISON:  None Available. FINDINGS: There is no evidence of fracture or dislocation. There is no evidence of arthropathy or other focal bone abnormality. Soft tissues are unremarkable. IMPRESSION: No acute osseous abnormality Electronically Signed   By: Jill Side M.D.   On: 05/17/2022 17:06   DG Chest Port 1 View  Result Date: 05/17/2022 CLINICAL DATA:  [id number] HTN (hypertension) [id number] EXAM: PORTABLE CHEST - 1 VIEW COMPARISON:  11/29/2021 FINDINGS: Cardiac silhouette is unremarkable. No pneumothorax or pleural effusion. The lungs are clear. The visualized skeletal structures are unremarkable. IMPRESSION: No acute cardiopulmonary process. Electronically Signed   By: Sammie Bench M.D.   On: 05/17/2022 12:55    Procedures Procedures   Medications Ordered in ED Medications  ketorolac (TORADOL) 15 MG/ML injection 15 mg (has no administration in time range)  metoprolol succinate (TOPROL-XL) 24 hr tablet 100 mg (100 mg Oral Given 05/17/22 1421)  hydrALAZINE (APRESOLINE) tablet 25 mg (25 mg Oral Given 05/17/22 1535)  lactated ringers bolus 1,000 mL (1,000  mLs Intravenous Bolus 05/17/22 1535)    ED Course/ Medical Decision Making/ A&P                           Medical Decision Making Amount and/or Complexity of Data Reviewed Labs: ordered. Radiology: ordered.  Risk Prescription drug management.   This patient presents to the ED for concern of hypertension. Differential diagnosis includes resistant hypertension, primary hypertension, hypertensive emergency, stroke, ACS  Lab Tests:  I Ordered, and personally interpreted labs.  The pertinent results include:  Normal CBC, BMP with some evidence of decreased renal function with elevated BUN, creatinine, and decreased GFR, but better than baseline, UA without infection, troponin negative   Imaging Studies ordered:  I ordered imaging studies including Chest xray, right shoulder xray  I independently visualized and interpreted imaging which showed no acute abnormalities noted I agree with the radiologist interpretation   Medicines ordered and prescription drug management:  I ordered medication including Toprol, hydralazine, LR, and toradol  for hypertension, dehydration, and pain  Reevaluation of the patient after these medicines showed that the patient improved I have reviewed the patients home medicines and have made adjustments as needed   Problem List / ED Course:  Patient presented to the ED with complaints of hypertension. He was sent from nephrology office today after they registered a BP of 200+/100+ and patient reported right shoulder pain. After talking with patient, he denied any chest pain, shortness of breath, headache, or lower extremity swelling. Not acutely concerned that this is hypertensive emergency, but his blood pressure is significantly elevated and appears to have become difficult to control over the last few months based on outpatient noted from cardiology and nephrology. After speaking with legal guardian, there does appear to be concerns about medication  noncompliance that may contributing to his blood pressure issues. Patient reported to me that he had taken his blood pressure medications today but his blood pressure did not improve until I ordered a dose of Toprol-XL and hydralazine. BP now within a stable range for discharge. Unsure if patient requires further investigation from nephrology for potential renal artery stenosis or if his poor BP control is due to poor compliance. Will plan on discharge with close outpatient follow up and monitoring of BP. Patient agreeable with treatment plan and verbalized understanding all return precautions.  Final Clinical Impression(s) / ED Diagnoses Final diagnoses:  Resistant hypertension    Rx / DC Orders ED Discharge Orders     None         Luvenia Heller, PA-C 05/17/22 1811    Godfrey Pick, MD 05/18/22 [phone]

## 2022-05-30 ENCOUNTER — Ambulatory Visit: Payer: 59 | Attending: Nurse Practitioner

## 2022-05-30 DIAGNOSIS — I1A0 Resistant hypertension: Secondary | ICD-10-CM

## 2022-06-06 ENCOUNTER — Telehealth: Payer: Self-pay | Admitting: Licensed Clinical Social Worker

## 2022-06-06 NOTE — Telephone Encounter (Signed)
CSW received referral to assist with possible bed bug infestation. Patient apparently came to office for a test and appeared to have multiple bed bugs on him and the office needed to call for extermination post visit. CSW contacted patient's POA Alyson Reedy and left message for return call. Lasandra Beech, LCSW, CCSW-MCS [phone]

## 2022-06-07 ENCOUNTER — Telehealth: Payer: Self-pay | Admitting: Licensed Clinical Social Worker

## 2022-06-07 NOTE — Telephone Encounter (Signed)
CSW received return call from [name] cousin who assists patient with his needs. CSW informed her of the concern regarding bed bugs when patient was in the office. She shared that his home is not in good condition and he often brings home "stuff that he picks out of garbage". She appeared to be aware of the bugs and shared that he also has fleas and roaches. She states that she tends to stay out of the house and he does not allow her to assist with much when it comes to his home. CSW explained the need for extermination as the bed bugs will travel everywhere that he goes and continue to spread. CSW recommended contacting [employer] to inquire about some possible assistance although not sure if the county would pay for extermination. CSW also discussed Patient Care Fund and could possibly assist patient with some funds towards the extermination. [name] plans to follow up with patient and contact some exterminators to obtain some quotes. She will return call to CSW to discuss options and further assistance. CSW will await return call. Marvin Beech, LCSW, CCSW-MCS [phone]

## 2022-06-17 ENCOUNTER — Telehealth (HOSPITAL_COMMUNITY): Payer: Self-pay | Admitting: Licensed Clinical Social Worker

## 2022-06-17 NOTE — Telephone Encounter (Signed)
CSW received return call from patient's POA stating that patient has begun to remove some of the waste throughout hi home to allow for exterminators to enter to give a quote. She states it is still a work in progress but hopeful to get some quotes soon and will return call to CSW. Lasandra Beech, LCSW, CCSW-MCS [phone]

## 2022-06-17 NOTE — Telephone Encounter (Signed)
CSW attempted to reach Alyson Reedy POA to follow up on conversation last week regarding assistance with extermination. NO answer and message left for return call. Lasandra Beech, LCSW, CCSW-MCS [phone]

## 2022-06-21 ENCOUNTER — Ambulatory Visit: Payer: 59 | Admitting: Nurse Practitioner

## 2022-07-09 ENCOUNTER — Other Ambulatory Visit: Payer: Self-pay | Admitting: Nurse Practitioner

## 2022-07-13 ENCOUNTER — Telehealth: Payer: Self-pay | Admitting: Licensed Clinical Social Worker

## 2022-07-13 NOTE — Telephone Encounter (Signed)
CSW left message for patient's POA Alyson Reedy for retrun call regarding request for assistance for infestation. CSW will await return call. Lasandra Beech, LCSW, CCSW-MCS [phone]

## 2022-08-07 ENCOUNTER — Other Ambulatory Visit: Payer: Self-pay | Admitting: Nurse Practitioner

## 2022-08-22 ENCOUNTER — Ambulatory Visit: Payer: 59 | Admitting: Nurse Practitioner

## 2022-08-23 ENCOUNTER — Encounter: Payer: Self-pay | Admitting: Nurse Practitioner

## 2022-11-04 DIAGNOSIS — I10 Essential (primary) hypertension: Secondary | ICD-10-CM | POA: Diagnosis not present

## 2022-11-12 DIAGNOSIS — G4489 Other headache syndrome: Secondary | ICD-10-CM | POA: Diagnosis not present

## 2022-11-12 DIAGNOSIS — I10 Essential (primary) hypertension: Secondary | ICD-10-CM | POA: Diagnosis not present

## 2023-03-01 ENCOUNTER — Emergency Department (HOSPITAL_COMMUNITY): Payer: 59

## 2023-03-01 ENCOUNTER — Inpatient Hospital Stay (HOSPITAL_COMMUNITY)
Admission: EM | Admit: 2023-03-01 | Discharge: 2023-03-04 | DRG: 872 | Disposition: A | Discharge status: Home / Self Care | Payer: 59 | Attending: Internal Medicine | Admitting: Internal Medicine

## 2023-03-01 ENCOUNTER — Other Ambulatory Visit: Payer: Self-pay

## 2023-03-01 DIAGNOSIS — M86179 Other acute osteomyelitis, unspecified ankle and foot: Secondary | ICD-10-CM | POA: Diagnosis not present

## 2023-03-01 DIAGNOSIS — M86171 Other acute osteomyelitis, right ankle and foot: Principal | ICD-10-CM | POA: Diagnosis present

## 2023-03-01 DIAGNOSIS — Z860100 Personal history of colon polyps, unspecified: Secondary | ICD-10-CM

## 2023-03-01 DIAGNOSIS — Z91041 Radiographic dye allergy status: Secondary | ICD-10-CM

## 2023-03-01 DIAGNOSIS — N189 Chronic kidney disease, unspecified: Secondary | ICD-10-CM | POA: Diagnosis present

## 2023-03-01 DIAGNOSIS — G8929 Other chronic pain: Secondary | ICD-10-CM | POA: Diagnosis present

## 2023-03-01 DIAGNOSIS — E1165 Type 2 diabetes mellitus with hyperglycemia: Secondary | ICD-10-CM | POA: Diagnosis present

## 2023-03-01 DIAGNOSIS — F419 Anxiety disorder, unspecified: Secondary | ICD-10-CM | POA: Diagnosis present

## 2023-03-01 DIAGNOSIS — I129 Hypertensive chronic kidney disease with stage 1 through stage 4 chronic kidney disease, or unspecified chronic kidney disease: Secondary | ICD-10-CM | POA: Diagnosis present

## 2023-03-01 DIAGNOSIS — M869 Osteomyelitis, unspecified: Secondary | ICD-10-CM | POA: Diagnosis present

## 2023-03-01 DIAGNOSIS — Z89412 Acquired absence of left great toe: Secondary | ICD-10-CM

## 2023-03-01 DIAGNOSIS — F32A Depression, unspecified: Secondary | ICD-10-CM | POA: Diagnosis present

## 2023-03-01 DIAGNOSIS — N1831 Chronic kidney disease, stage 3a: Secondary | ICD-10-CM | POA: Diagnosis present

## 2023-03-01 DIAGNOSIS — N1832 Chronic kidney disease, stage 3b: Secondary | ICD-10-CM | POA: Diagnosis present

## 2023-03-01 DIAGNOSIS — E872 Acidosis, unspecified: Secondary | ICD-10-CM | POA: Diagnosis present

## 2023-03-01 DIAGNOSIS — Z89411 Acquired absence of right great toe: Secondary | ICD-10-CM | POA: Diagnosis not present

## 2023-03-01 DIAGNOSIS — E785 Hyperlipidemia, unspecified: Secondary | ICD-10-CM | POA: Diagnosis not present

## 2023-03-01 DIAGNOSIS — Z794 Long term (current) use of insulin: Secondary | ICD-10-CM | POA: Diagnosis not present

## 2023-03-01 DIAGNOSIS — Z743 Need for continuous supervision: Secondary | ICD-10-CM | POA: Diagnosis not present

## 2023-03-01 DIAGNOSIS — I499 Cardiac arrhythmia, unspecified: Secondary | ICD-10-CM | POA: Diagnosis not present

## 2023-03-01 DIAGNOSIS — E11621 Type 2 diabetes mellitus with foot ulcer: Secondary | ICD-10-CM | POA: Diagnosis present

## 2023-03-01 DIAGNOSIS — L97519 Non-pressure chronic ulcer of other part of right foot with unspecified severity: Secondary | ICD-10-CM | POA: Diagnosis present

## 2023-03-01 DIAGNOSIS — A419 Sepsis, unspecified organism: Principal | ICD-10-CM | POA: Diagnosis present

## 2023-03-01 DIAGNOSIS — Z887 Allergy status to serum and vaccine status: Secondary | ICD-10-CM

## 2023-03-01 DIAGNOSIS — Z7984 Long term (current) use of oral hypoglycemic drugs: Secondary | ICD-10-CM | POA: Diagnosis not present

## 2023-03-01 DIAGNOSIS — Z89422 Acquired absence of other left toe(s): Secondary | ICD-10-CM

## 2023-03-01 DIAGNOSIS — I10 Essential (primary) hypertension: Secondary | ICD-10-CM | POA: Diagnosis not present

## 2023-03-01 DIAGNOSIS — K760 Fatty (change of) liver, not elsewhere classified: Secondary | ICD-10-CM | POA: Diagnosis present

## 2023-03-01 DIAGNOSIS — E1121 Type 2 diabetes mellitus with diabetic nephropathy: Secondary | ICD-10-CM

## 2023-03-01 DIAGNOSIS — M109 Gout, unspecified: Secondary | ICD-10-CM | POA: Diagnosis present

## 2023-03-01 DIAGNOSIS — R6889 Other general symptoms and signs: Secondary | ICD-10-CM | POA: Diagnosis not present

## 2023-03-01 DIAGNOSIS — E1169 Type 2 diabetes mellitus with other specified complication: Secondary | ICD-10-CM | POA: Diagnosis present

## 2023-03-01 DIAGNOSIS — N183 Chronic kidney disease, stage 3 unspecified: Secondary | ICD-10-CM | POA: Diagnosis not present

## 2023-03-01 DIAGNOSIS — Z800 Family history of malignant neoplasm of digestive organs: Secondary | ICD-10-CM

## 2023-03-01 DIAGNOSIS — E119 Type 2 diabetes mellitus without complications: Secondary | ICD-10-CM

## 2023-03-01 DIAGNOSIS — Z89431 Acquired absence of right foot: Secondary | ICD-10-CM | POA: Diagnosis not present

## 2023-03-01 DIAGNOSIS — E7800 Pure hypercholesterolemia, unspecified: Secondary | ICD-10-CM | POA: Diagnosis present

## 2023-03-01 DIAGNOSIS — E1122 Type 2 diabetes mellitus with diabetic chronic kidney disease: Secondary | ICD-10-CM | POA: Diagnosis present

## 2023-03-01 DIAGNOSIS — R0689 Other abnormalities of breathing: Secondary | ICD-10-CM | POA: Diagnosis not present

## 2023-03-01 DIAGNOSIS — R509 Fever, unspecified: Secondary | ICD-10-CM | POA: Diagnosis not present

## 2023-03-01 DIAGNOSIS — K219 Gastro-esophageal reflux disease without esophagitis: Secondary | ICD-10-CM | POA: Diagnosis present

## 2023-03-01 DIAGNOSIS — Z79899 Other long term (current) drug therapy: Secondary | ICD-10-CM | POA: Diagnosis not present

## 2023-03-01 DIAGNOSIS — M8610 Other acute osteomyelitis, unspecified site: Secondary | ICD-10-CM | POA: Diagnosis not present

## 2023-03-01 DIAGNOSIS — S98111A Complete traumatic amputation of right great toe, initial encounter: Secondary | ICD-10-CM

## 2023-03-01 DIAGNOSIS — Z89421 Acquired absence of other right toe(s): Secondary | ICD-10-CM | POA: Diagnosis not present

## 2023-03-01 DIAGNOSIS — R739 Hyperglycemia, unspecified: Secondary | ICD-10-CM

## 2023-03-01 DIAGNOSIS — L98499 Non-pressure chronic ulcer of skin of other sites with unspecified severity: Secondary | ICD-10-CM | POA: Diagnosis not present

## 2023-03-01 DIAGNOSIS — M799 Soft tissue disorder, unspecified: Secondary | ICD-10-CM | POA: Diagnosis not present

## 2023-03-01 LAB — CBC
HCT: 41.6 % (ref 39.0–52.0)
Hemoglobin: 14.4 g/dL (ref 13.0–17.0)
MCH: 29.4 pg (ref 26.0–34.0)
MCHC: 34.6 g/dL (ref 30.0–36.0)
MCV: 85.1 fL (ref 80.0–100.0)
Platelets: 195 10*3/uL (ref 150–400)
RBC: 4.89 MIL/uL (ref 4.22–5.81)
RDW: 13.2 % (ref 11.5–15.5)
WBC: 6.7 10*3/uL (ref 4.0–10.5)
nRBC: 0 % (ref 0.0–0.2)

## 2023-03-01 LAB — URINALYSIS, ROUTINE W REFLEX MICROSCOPIC
Bacteria, UA: NONE SEEN
Bilirubin Urine: NEGATIVE
Glucose, UA: >=500 mg/dL — AB
Hgb urine dipstick: NEGATIVE
Ketones, ur: NEGATIVE mg/dL
Leukocytes,Ua: NEGATIVE
Nitrite: NEGATIVE
Protein, ur: 100 mg/dL — AB
Specific Gravity, Urine: 1.016 (ref 1.005–1.030)
pH: 5 (ref 5.0–8.0)

## 2023-03-01 LAB — CBG MONITORING, ED
Glucose-Capillary: 151 mg/dL — ABNORMAL HIGH (ref 70–99)
Glucose-Capillary: 188 mg/dL — ABNORMAL HIGH (ref 70–99)
Glucose-Capillary: 191 mg/dL — ABNORMAL HIGH (ref 70–99)
Glucose-Capillary: 272 mg/dL — ABNORMAL HIGH (ref 70–99)
Glucose-Capillary: 329 mg/dL — ABNORMAL HIGH (ref 70–99)
Glucose-Capillary: 370 mg/dL — ABNORMAL HIGH (ref 70–99)
Glucose-Capillary: 403 mg/dL — ABNORMAL HIGH (ref 70–99)

## 2023-03-01 LAB — BLOOD GAS, VENOUS
Acid-Base Excess: 0.7 mmol/L (ref 0.0–2.0)
Bicarbonate: 25.4 mmol/L (ref 20.0–28.0)
Drawn by: 69556
O2 Saturation: 90.5 %
Patient temperature: 37.2
pCO2, Ven: 40 mm[Hg] — ABNORMAL LOW (ref 44–60)
pH, Ven: 7.41 (ref 7.25–7.43)
pO2, Ven: 58 mm[Hg] — ABNORMAL HIGH (ref 32–45)

## 2023-03-01 LAB — COMPREHENSIVE METABOLIC PANEL
ALT: 17 U/L (ref 0–44)
AST: 18 U/L (ref 15–41)
Albumin: 3.6 g/dL (ref 3.5–5.0)
Alkaline Phosphatase: 79 U/L (ref 38–126)
Anion gap: 9 (ref 5–15)
BUN: 28 mg/dL — ABNORMAL HIGH (ref 8–23)
CO2: 23 mmol/L (ref 22–32)
Calcium: 9 mg/dL (ref 8.9–10.3)
Chloride: 97 mmol/L — ABNORMAL LOW (ref 98–111)
Creatinine, Ser: 1.38 mg/dL — ABNORMAL HIGH (ref 0.61–1.24)
GFR, Estimated: 55 mL/min — ABNORMAL LOW (ref >60)
Glucose, Bld: 401 mg/dL — ABNORMAL HIGH (ref 70–99)
Potassium: 4.2 mmol/L (ref 3.5–5.1)
Sodium: 129 mmol/L — ABNORMAL LOW (ref 135–145)
Total Bilirubin: 0.9 mg/dL (ref 0.0–1.2)
Total Protein: 7.1 g/dL (ref 6.5–8.1)

## 2023-03-01 LAB — BETA-HYDROXYBUTYRIC ACID: Beta-Hydroxybutyric Acid: 0.22 mmol/L (ref 0.05–0.27)

## 2023-03-01 LAB — LACTIC ACID, PLASMA
Lactic Acid, Venous: 2.3 mmol/L (ref 0.5–1.9)
Lactic Acid, Venous: 2.4 mmol/L (ref 0.5–1.9)

## 2023-03-01 LAB — HEMOGLOBIN A1C
Hgb A1c MFr Bld: 10.7 % — ABNORMAL HIGH (ref 4.8–5.6)
Mean Plasma Glucose: 260.39 mg/dL

## 2023-03-01 MED ORDER — FEBUXOSTAT 40 MG PO TABS
80.0000 mg | ORAL_TABLET | Freq: Every day | ORAL | Status: DC
  Administered 2023-03-02 – 2023-03-04 (×3): 80 mg via ORAL
  Filled 2023-03-01 (×3): qty 2

## 2023-03-01 MED ORDER — SENNOSIDES-DOCUSATE SODIUM 8.6-50 MG PO TABS: 1.0000 | ORAL_TABLET | Freq: Every evening | ORAL | Status: DC | PRN

## 2023-03-01 MED ORDER — ACETAMINOPHEN 650 MG RE SUPP: 650.0000 mg | Freq: Four times a day (QID) | RECTAL | Status: DC | PRN

## 2023-03-01 MED ORDER — ACETAMINOPHEN 325 MG PO TABS
650.0000 mg | ORAL_TABLET | Freq: Four times a day (QID) | ORAL | Status: DC | PRN
  Filled 2023-03-01: qty 2

## 2023-03-01 MED ORDER — INSULIN GLARGINE-YFGN 100 UNIT/ML ~~LOC~~ SOLN
10.0000 [IU] | Freq: Every day | SUBCUTANEOUS | Status: DC
  Administered 2023-03-02: 10 [IU] via SUBCUTANEOUS
  Filled 2023-03-01 (×3): qty 0.1

## 2023-03-01 MED ORDER — INSULIN GLARGINE-YFGN 100 UNIT/ML ~~LOC~~ SOLN
15.0000 [IU] | Freq: Every day | SUBCUTANEOUS | Status: DC
  Administered 2023-03-01: 15 [IU] via SUBCUTANEOUS
  Filled 2023-03-01 (×2): qty 0.15

## 2023-03-01 MED ORDER — VANCOMYCIN HCL IN DEXTROSE 1-5 GM/200ML-% IV SOLN: 1000.0000 mg | Freq: Once | INTRAVENOUS | Status: DC

## 2023-03-01 MED ORDER — INSULIN ASPART 100 UNIT/ML IJ SOLN
10.0000 [IU] | Freq: Once | INTRAMUSCULAR | Status: AC
  Administered 2023-03-01: 10 [IU] via SUBCUTANEOUS
  Filled 2023-03-01: qty 1

## 2023-03-01 MED ORDER — HYDRALAZINE HCL 20 MG/ML IJ SOLN: 5.0000 mg | INTRAMUSCULAR | Status: DC | PRN

## 2023-03-01 MED ORDER — SODIUM CHLORIDE 0.9 % IV BOLUS
500.0000 mL | Freq: Once | INTRAVENOUS | Status: AC
  Administered 2023-03-01: 500 mL via INTRAVENOUS

## 2023-03-01 MED ORDER — PIPERACILLIN-TAZOBACTAM 3.375 G IVPB
3.3750 g | Freq: Three times a day (TID) | INTRAVENOUS | Status: DC
Start: 2023-03-02 — End: 2023-03-04
  Administered 2023-03-02 – 2023-03-04 (×8): 3.375 g via INTRAVENOUS
  Filled 2023-03-01 (×7): qty 50

## 2023-03-01 MED ORDER — SODIUM CHLORIDE 0.9 % IV SOLN: INTRAVENOUS | Status: DC

## 2023-03-01 MED ORDER — INSULIN ASPART 100 UNIT/ML IJ SOLN
0.0000 [IU] | Freq: Three times a day (TID) | INTRAMUSCULAR | Status: DC
  Administered 2023-03-01 – 2023-03-02 (×2): 3 [IU] via SUBCUTANEOUS
  Administered 2023-03-02: 8 [IU] via SUBCUTANEOUS
  Administered 2023-03-02 – 2023-03-03 (×2): 11 [IU] via SUBCUTANEOUS
  Administered 2023-03-03: 3 [IU] via SUBCUTANEOUS
  Administered 2023-03-03: 5 [IU] via SUBCUTANEOUS
  Administered 2023-03-04: 3 [IU] via SUBCUTANEOUS
  Administered 2023-03-04: 8 [IU] via SUBCUTANEOUS
  Filled 2023-03-01: qty 1

## 2023-03-01 MED ORDER — LACTATED RINGERS IV SOLN
150.0000 mL/h | INTRAVENOUS | Status: AC
  Administered 2023-03-01: 150 mL/h via INTRAVENOUS

## 2023-03-01 MED ORDER — B COMPLEX-C PO TABS
1.0000 | ORAL_TABLET | Freq: Every day | ORAL | Status: DC
Start: 2023-03-02 — End: 2023-03-04
  Administered 2023-03-02 – 2023-03-04 (×3): 1 via ORAL
  Filled 2023-03-01 (×3): qty 1

## 2023-03-01 MED ORDER — FERROUS SULFATE 325 (65 FE) MG PO TABS
325.0000 mg | ORAL_TABLET | Freq: Every day | ORAL | Status: DC
  Administered 2023-03-02 – 2023-03-04 (×3): 325 mg via ORAL
  Filled 2023-03-01 (×3): qty 1

## 2023-03-01 MED ORDER — METOPROLOL SUCCINATE ER 50 MG PO TB24
50.0000 mg | ORAL_TABLET | Freq: Once | ORAL | Status: AC
  Administered 2023-03-01: 50 mg via ORAL
  Filled 2023-03-01: qty 1

## 2023-03-01 MED ORDER — PIPERACILLIN-TAZOBACTAM 3.375 G IVPB
3.3750 g | Freq: Once | INTRAVENOUS | Status: AC
  Administered 2023-03-01: 3.375 g via INTRAVENOUS
  Filled 2023-03-01: qty 50

## 2023-03-01 MED ORDER — INSULIN ASPART 100 UNIT/ML IJ SOLN
0.0000 [IU] | Freq: Every day | INTRAMUSCULAR | Status: DC
  Administered 2023-03-02: 2 [IU] via SUBCUTANEOUS

## 2023-03-01 MED ORDER — METOPROLOL SUCCINATE ER 50 MG PO TB24
100.0000 mg | ORAL_TABLET | Freq: Every day | ORAL | Status: DC
  Administered 2023-03-02 – 2023-03-04 (×3): 100 mg via ORAL
  Filled 2023-03-01 (×3): qty 2

## 2023-03-01 MED ORDER — SERTRALINE HCL 50 MG PO TABS
100.0000 mg | ORAL_TABLET | Freq: Every day | ORAL | Status: DC
  Administered 2023-03-01 – 2023-03-03 (×3): 100 mg via ORAL
  Filled 2023-03-01 (×3): qty 2

## 2023-03-01 MED ORDER — BUPROPION HCL ER (XL) 150 MG PO TB24
150.0000 mg | ORAL_TABLET | Freq: Every day | ORAL | Status: DC
  Administered 2023-03-01 – 2023-03-03 (×3): 150 mg via ORAL
  Filled 2023-03-01 (×3): qty 1

## 2023-03-01 MED ORDER — VANCOMYCIN HCL 2000 MG/400ML IV SOLN
2000.0000 mg | INTRAVENOUS | Status: DC
  Administered 2023-03-01 – 2023-03-03 (×3): 2000 mg via INTRAVENOUS
  Filled 2023-03-01 (×4): qty 400

## 2023-03-01 MED ORDER — HYDRALAZINE HCL 25 MG PO TABS
25.0000 mg | ORAL_TABLET | Freq: Two times a day (BID) | ORAL | Status: DC
  Administered 2023-03-01 – 2023-03-04 (×6): 25 mg via ORAL
  Filled 2023-03-01 (×6): qty 1

## 2023-03-01 MED ORDER — PANTOPRAZOLE SODIUM 40 MG PO TBEC
40.0000 mg | DELAYED_RELEASE_TABLET | Freq: Every day | ORAL | Status: DC
  Administered 2023-03-02 – 2023-03-04 (×3): 40 mg via ORAL
  Filled 2023-03-01 (×3): qty 1

## 2023-03-01 MED ORDER — GADOBUTROL 1 MMOL/ML IV SOLN
9.0000 mL | Freq: Once | INTRAVENOUS | Status: AC | PRN
  Administered 2023-03-01: 9 mL via INTRAVENOUS

## 2023-03-01 MED ORDER — INSULIN GLARGINE-YFGN 100 UNIT/ML ~~LOC~~ SOLN: 20.0000 [IU] | Freq: Two times a day (BID) | SUBCUTANEOUS | Status: DC

## 2023-03-01 MED ORDER — INSULIN ASPART 100 UNIT/ML IJ SOLN
15.0000 [IU] | Freq: Once | INTRAMUSCULAR | Status: AC
  Administered 2023-03-01: 15 [IU] via SUBCUTANEOUS
  Filled 2023-03-01: qty 1

## 2023-03-01 MED ORDER — VITAMIN D 25 MCG (1000 UNIT) PO TABS
2000.0000 [IU] | ORAL_TABLET | Freq: Every day | ORAL | Status: DC
  Administered 2023-03-02 – 2023-03-04 (×3): 2000 [IU] via ORAL
  Filled 2023-03-01 (×3): qty 2

## 2023-03-01 MED ORDER — SODIUM BICARBONATE 650 MG PO TABS
650.0000 mg | ORAL_TABLET | Freq: Two times a day (BID) | ORAL | Status: DC
Start: 2023-03-01 — End: 2023-03-04
  Administered 2023-03-01 – 2023-03-04 (×6): 650 mg via ORAL
  Filled 2023-03-01 (×6): qty 1

## 2023-03-01 MED ORDER — ATORVASTATIN CALCIUM 10 MG PO TABS
10.0000 mg | ORAL_TABLET | Freq: Every day | ORAL | Status: DC
  Administered 2023-03-01 – 2023-03-03 (×3): 10 mg via ORAL
  Filled 2023-03-01 (×3): qty 1

## 2023-03-01 MED ORDER — ENOXAPARIN SODIUM 40 MG/0.4ML IJ SOSY
40.0000 mg | PREFILLED_SYRINGE | INTRAMUSCULAR | Status: DC
  Administered 2023-03-01 – 2023-03-03 (×3): 40 mg via SUBCUTANEOUS
  Filled 2023-03-01 (×3): qty 0.4

## 2023-03-01 MED ORDER — METOPROLOL SUCCINATE ER 25 MG PO TB24
25.0000 mg | ORAL_TABLET | Freq: Every day | ORAL | Status: DC
  Administered 2023-03-01: 25 mg via ORAL
  Filled 2023-03-01: qty 1

## 2023-03-01 MED ORDER — ALBUTEROL SULFATE (2.5 MG/3ML) 0.083% IN NEBU: 2.5000 mg | INHALATION_SOLUTION | RESPIRATORY_TRACT | Status: DC | PRN

## 2023-03-01 NOTE — ED Notes (Signed)
Pt given urinal to provide urine specimen

## 2023-03-01 NOTE — ED Provider Notes (Signed)
 3:37 PM Assumed care of patient from off-going team. For more details, please see note from same day.  In brief, this is a 73 y.o. male who presents w/ hyperglycemia, right foot ulceration. H/o diabetes and prior amputations.  Plan/Dispo at time of sign-out & ED Course since sign-out: [ ]  MRI  BP (!) 157/82   Pulse (!) 101   Temp 98.5 F (36.9 C) (Oral)   Resp 20   Ht 6\' 5"  (1.956 m)   Wt 99.8 kg   SpO2 95%   BMI 26.09 kg/m      ED Course:   Clinical Course as of 03/01/23 1635  Wed Mar 01, 2023  1128 ED EKG [SZ]  1537 MR FOOT RIGHT W WO CONTRAST 1. Appearance most compatible with acute osteomyelitis of the first metatarsal resection margin. 2. Soft tissue ulceration at the plantar aspect of the forefoot underlying the distal first metatarsal. 3. Patchy bone marrow edema within the medial cuneiform, navicular, and fourth metatarsal base likely reflecting stress-related changes.   [HN]  1538 Consulted to orthopedic surgery [HN]  1625 D/w Dr. Phyllis Breeze who will come to see the patient when he is admitted. Started on vanc/zosyn . Will admit to medicine. [HN]    Clinical Course User Index [HN] Merdis Stalling, MD [SZ] Zackowski, Scott, MD    Dispo: Admitted to hospitalist ------------------------------- Annita Kindle, MD Emergency Medicine  This note was created using dictation software, which may contain spelling or grammatical errors.   Merdis Stalling, MD 03/01/23 [phone]

## 2023-03-01 NOTE — ED Provider Notes (Addendum)
 [place] EMERGENCY DEPARTMENT AT [facility] Provider Note   CSN: [id number] Arrival date & time: 03/01/23  5366     History  Chief Complaint  Patient presents with   Hyperglycemia   Hypertension   Fever    [name] is a 73 y.o. male.  Patient brought in by EMS from home for elevated blood sugars.  Patient difficult historian.  Patient also complaining of some chronic leg pain.  But main concern was blood sugars have been running in the upper 300s.  Patient denies any nausea vomiting or diarrhea denies any pain other than the leg pain.  Past medical history as listed patient has a speech impediment.  That would explain the difficulty with communication.  His vital signs here significant for heart rate of 113 temp of 99 blood pressure 186/97 oxygen sats 95%.  Patient's education list shows he is on Toujeo  pen.  10 units daily seems as if he is not on anything else.  Past medical history sniffing for hypertension high cholesterol diabetes speech impediment thrombocytopenia past medical history.  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  Patient is never used tobacco products.       Home Medications Prior to Admission medications   Medication Sig Start Date End Date Taking? Authorizing Provider  ACCU-CHEK GUIDE test strip USE AS DIRECTED TO TEST TWICE DAILY 02/01/22   [provider]  acetaminophen  (TYLENOL ) 500 MG tablet Take 500-1,000 mg by mouth every 6 (six) hours as needed for mild pain or headache. Pain    [provider]  atorvastatin  (LIPITOR) 10 MG tablet Take 10 mg by mouth at bedtime. 09/21/17   [provider]  B Complex-C (B-COMPLEX WITH VITAMIN C) tablet Take 1 tablet by mouth daily.    [provider]  buPROPion  (WELLBUTRIN  XL) 150 MG 24 hr tablet Take 150 mg by  mouth at bedtime.  08/29/12   [provider]  cetirizine (ZYRTEC) 10 MG tablet Take 10 mg by mouth daily as needed for allergies.    [provider]  Cholecalciferol  (VITAMIN D ) 50 MCG (2000 UT) CAPS Take 2,000 Units by mouth daily.     [provider]  ciprofloxacin  (CIPRO ) 500 MG tablet SMARTSIG:1 Tablet(s) By Mouth Every 12 Hours Patient not taking: Reported on 05/17/2022 04/08/22   [provider]  clindamycin (CLEOCIN) 300 MG capsule Take 300 mg by mouth every 6 (six) hours. Patient not taking: Reported on 05/17/2022 04/08/22   [provider]  ferrous sulfate  325 (65 FE) MG EC tablet Take 325 mg by mouth daily with breakfast.    [provider]  glipiZIDE (GLUCOTROL) 5 MG tablet Take 5 mg by mouth 2 (two) times daily. Patient not taking: Reported on 05/17/2022 05/05/22   [provider]  hydrALAZINE  (APRESOLINE ) 25 MG tablet TAKE 1 TABLET BY MOUTH IN THE MORNING AND AT BEDTIME. MAY TAKE ADDITIONAL AS NEEDED FOR SYSTOLIC BLOOD PRESSURE CONSISTANTLY ABOVE 140 07/12/22   Lasalle Pointer, NP  HYDROcodone -acetaminophen  (NORCO/VICODIN) 5-325 MG tablet Take 1 tablet by mouth every 4 (four) hours as needed. Patient not taking: Reported on 05/17/2022 02/04/22   Timothy Ford, MD  labetalol  (NORMODYNE ) 100 MG tablet Take 100 mg by mouth 2 (two) times daily. Patient not taking: Reported on 05/17/2022 05/05/22   [provider]  loperamide  (IMODIUM ) 2 MG capsule  TAKE ONE CAPSULE BY MOUTH TWO TIMES DAILY AS NEEDED FOR DIARRHEA OR LOOSE STOOLS 09/02/21   Carlan, Chelsea L, NP  metoprolol  succinate (TOPROL -XL) 100 MG 24 hr tablet TAKE 1 TABLET BY MOUTH DAILY. TAKE WITH OR IMMEDIATELY FOLLOWING A MEAL 08/08/22   Lasalle Pointer, NP  NIFEdipine  (ADALAT  CC) 90 MG 24 hr tablet Take 90 mg by mouth daily. Patient not taking: Reported on 05/17/2022 12/06/21   [provider]  Omega-3 Fatty Acids (FISH OIL) 1000 MG CAPS Take 1,000 mg by mouth 2 (two)  times daily. Afternoon & bedtime.    [provider]  omeprazole  (PRILOSEC) 20 MG capsule TAKE ONE CAPSULE BY MOUTH DAILY. NEEDS OFFICE VISIT FOR FURTHER REFILLS. 01/24/22   Carlan, Chelsea L, NP  pioglitazone (ACTOS) 30 MG tablet Take 30 mg by mouth daily. Patient not taking: Reported on 05/17/2022 10/28/17   [provider]  sertraline  (ZOLOFT ) 100 MG tablet Take 100 mg by mouth at bedtime.  05/09/15   [provider]  sodium bicarbonate  650 MG tablet Take 650 mg by mouth 2 (two) times daily. 07/18/17   [provider]  sulfamethoxazole -trimethoprim  (BACTRIM  DS) 800-160 MG tablet Take 1 tablet by mouth 2 (two) times daily. Patient not taking: Reported on 05/17/2022 03/08/22   Reuben Castilla, NP  TOUJEO  MAX SOLOSTAR 300 UNIT/ML Solostar Pen Inject 10 Units into the skin daily. Patient taking differently: Inject 20 Units into the skin daily. 11/14/21   Johnson, Clanford L, MD  ULORIC  80 MG TABS Take 80 mg by mouth daily.  04/21/15   [provider]  Vitamin D , Ergocalciferol , (DRISDOL) 1.25 MG (50000 UNIT) CAPS capsule Take 50,000 Units by mouth once a week. Sundays Patient not taking: Reported on 05/17/2022 07/21/21   [provider]      Allergies    Ivp dye [iodinated contrast media] and Tetanus toxoids    Review of Systems   Review of Systems  Constitutional:  Negative for chills and fever.  HENT:  Negative for ear pain and sore throat.   Eyes:  Negative for pain and visual disturbance.  Respiratory:  Negative for cough and shortness of breath.   Cardiovascular:  Negative for chest pain, palpitations and leg swelling.  Gastrointestinal:  Negative for abdominal pain and vomiting.  Genitourinary:  Negative for dysuria and hematuria.  Musculoskeletal:  Negative for arthralgias and back pain.  Skin:  Negative for color change and rash.  Neurological:  Positive for speech difficulty. Negative for seizures, syncope and weakness.  All other systems  reviewed and are negative.   Physical Exam Updated Vital Signs BP (!) 186/97   Pulse (!) 113   Temp 99 F (37.2 C) (Oral)   Resp 13   Ht 1.956 m (6\' 5" )   Wt 99.8 kg   SpO2 95%   BMI 26.09 kg/m  Physical Exam Vitals and nursing note reviewed.  Constitutional:      General: He is not in acute distress.    Appearance: He is well-developed.  HENT:     Head: Normocephalic and atraumatic.     Mouth/Throat:     Mouth: Mucous membranes are moist.  Eyes:     Conjunctiva/sclera: Conjunctivae normal.  Cardiovascular:     Rate and Rhythm: Normal rate and regular rhythm.     Heart sounds: No murmur heard. Pulmonary:     Effort: Pulmonary effort is normal. No respiratory distress.     Breath sounds: Normal breath sounds.  Abdominal:  General: There is no distension.     Palpations: Abdomen is soft.     Tenderness: There is no abdominal tenderness.  Musculoskeletal:        General: No swelling.     Cervical back: Neck supple.     Right lower leg: No edema.     Left lower leg: No edema.     Comments: Right foot transmetatarsal amputation.  Left great toe amputation.  Left foot without any concerns.  Right foot at the base of where the metatarsal amputation is on the sole of the foot there is a chronic ulcer measuring about 3 cm.  Had a black in color.  Some slight redness to that area.  No crepitance.  No fluctuance.  Skin:    General: Skin is warm and dry.     Capillary Refill: Capillary refill takes less than 2 seconds.  Neurological:     Mental Status: He is alert.  Psychiatric:        Mood and Affect: Mood normal.     ED Results / Procedures / Treatments   Labs (all labs ordered are listed, but only abnormal results are displayed) Labs Reviewed  BLOOD GAS, VENOUS - Abnormal; Notable for the following components:      Result Value   pCO2, Ven 40 (*)    pO2, Ven 58 (*)    All other components within normal limits  COMPREHENSIVE METABOLIC PANEL - Abnormal; Notable  for the following components:   Sodium 129 (*)    Chloride 97 (*)    Glucose, Bld 401 (*)    BUN 28 (*)    Creatinine, Ser 1.38 (*)    GFR, Estimated 55 (*)    All other components within normal limits  LACTIC ACID, PLASMA - Abnormal; Notable for the following components:   Lactic Acid, Venous 2.3 (*)    All other components within normal limits  CBG MONITORING, ED - Abnormal; Notable for the following components:   Glucose-Capillary 370 (*)    All other components within normal limits  CULTURE, BLOOD (ROUTINE X 2)  CULTURE, BLOOD (ROUTINE X 2)  CBC  BETA-HYDROXYBUTYRIC ACID  URINALYSIS, ROUTINE W REFLEX MICROSCOPIC  LACTIC ACID, PLASMA  CBG MONITORING, ED    EKG None  Radiology No results found.  Procedures Procedures    Medications Ordered in ED Medications  0.9 %  sodium chloride  infusion (has no administration in time range)  sodium chloride  0.9 % bolus 500 mL (500 mLs Intravenous New Bag/Given 03/01/23 2841)    ED Course/ Medical Decision Making/ A&P Clinical Course as of 03/01/23 1128  Wed Mar 01, 2023  1128 ED EKG [SZ]    Clinical Course User Index [SZ] Kimiyo Carmicheal, MD                                 Medical Decision Making Amount and/or Complexity of Data Reviewed Labs: ordered. Radiology: ordered. ECG/medicine tests:  Decision-making details documented in ED Course.  Risk Prescription drug management.   Patient's blood sugar here is elevated.  Was 370.  Complete metabolic panel pending.  Because had a little bit low-grade fever at 99 will be tachycardic although it may all be due to elevated blood sugar venous blood gas and blood cultures were done.  Venous blood gas is reassuring with pH 7.41 so not acidotic.  Patient's metabolic panel glucose on that is 401 potassium is good at  4.2 renal function GFR is 55 liver function test are normal anion gap is normal.  That is also reassuring.  I reviewed his chest x-ray no acute findings on that formal  read still pending.  Will give some IV fluids.  Check urinalysis.  Formal read of chest x-ray.  Follow his blood sugars.  Patient's lactic acid did come back in the 2 range.  Will keep that in mind since there is possibility of an infection.  But clinically does not meet sepsis criteria.  White count is not elevated 6.7.  Patient does not seem to be showing any signs of an upper respiratory infection or flulike illness.  In addition patient's blood pressure is elevated here.  Past notes show that patient has extremely difficult to control hypertension.  Patient does not seem to have any significant endorgan damage.  Also not complaining of chest pain severe headache or any strokelike symptoms.  No shortness of breath.  Patient's blood sugars remaining in the low 300s.  Will give 10 units subcu regular insulin .  Urinalysis negative for urinary tract infection.  Repeat lactic acid still around 2.4.  Beta hydroxybutyrate acid is normal venous blood gas pH was normal.  So no concerns for diabetic ketoacidosis.  Renal function reasonable at 55 blood cultures x 2 are pending.  Did do x-ray of his right foot due to that ulcer.  Says possible osteomyelitis of recommend MRI.  So we will order MRI for further evaluation chest x-ray was negative no acute findings.  Certainly if there is evidence of osteomyelitis that may explain why he is struggling to keep his blood sugars under control.  Addition patient is EKG without significant changes compared to old.  And blood sugar did come down to 272 with a touch of some subcu insulin .  Patient did say he did use his insulin  pen earlier today before he came in.   Final Clinical Impression(s) / ED Diagnoses Final diagnoses:  Hyperglycemia    Rx / DC Orders ED Discharge Orders     None         Nicklas Barns, MD 03/01/23 4098    Nicklas Barns, MD 03/01/23 1191    Nicklas Barns, MD 03/01/23 1213    Nicklas Barns, MD 03/01/23 [phone]

## 2023-03-01 NOTE — ED Notes (Signed)
 Transported to MRI

## 2023-03-01 NOTE — ED Notes (Signed)
 Pt appears comfortable at this time. Denies pain. Pt request something to drink. EDP notified of pt's request.

## 2023-03-01 NOTE — Consult Note (Signed)
 Pharmacy Antibiotic Note  [name] is a 73 y.o. male with history including diabetes and prior amputations admitted on 03/01/2023 with  right foot ulceration / DFI . Orthopaedic surgery consulted. Pharmacy has been consulted for vancomycin  and Zosyn  dosing.  Plan:  Zosyn  3.375 g IV q8h (4-hr infusion)  Vancomycin  2 g IV q24h --Calculated AUC: 499, Cmin: 11.1 --Daily Scr per protocol --Levels at steady state or as clinically indicated  Height: 6\' 5"  (195.6 cm) Weight: 99.8 kg (220 lb) IBW/kg (Calculated) : 89.1  Temp (24hrs), Avg:98.8 F (37.1 C), Min:98.5 F (36.9 C), Max:99 F (37.2 C)  Recent Labs  Lab 03/01/23 0803 03/01/23 0931  WBC 6.7  --   CREATININE 1.38*  --   LATICACIDVEN 2.3* 2.4*    Estimated Creatinine Clearance: 61.9 mL/min (A) (by C-G formula based on SCr of 1.38 mg/dL (H)).    Allergies  Allergen Reactions   Ivp Dye [Iodinated Contrast Media]     Sped heart reate up    Tetanus Toxoids Rash    Antimicrobials this admission: Zosyn  1/15 >>  Vancomycin  1/15 >>   Dose adjustments this admission: N/A  Microbiology results: 1/15 BCx: pending  Thank you for allowing pharmacy to be a part of this patient's care.  Lakshmi Sundeen B Philicia Heyne 03/01/2023 4:45 PM

## 2023-03-01 NOTE — ED Notes (Signed)
 Date and time results received: 03/01/23 0954 (use smartphrase ".now" to insert current time)  Test: lactic acid  Critical Value: 2.4  Name of Provider Notified: Dr. Zackowski  Orders Received? Or Actions Taken?: Orders Received - See Orders for details

## 2023-03-01 NOTE — Inpatient Diabetes Management (Signed)
 Inpatient Diabetes Program Recommendations  AACE/ADA: New Consensus Statement on Inpatient Glycemic Control   Target Ranges:  Prepandial:   less than 140 mg/dL      Peak postprandial:   less than 180 mg/dL (1-2 hours)      Critically ill patients:  140 - 180 mg/dL    Latest Reference Range & Units 03/01/23 07:26 03/01/23 10:03  Glucose-Capillary 70 - 99 mg/dL 409 (H) 811 (H)   Review of Glycemic Control  Diabetes history: DM2 Outpatient Diabetes medications: Toujeo  20 units BID, Glipizide 5 mg BID, Actos 30 mg daily (not taking) Current orders for Inpatient glycemic control: None  Inpatient Diabetes Program Recommendations:    Insulin : Please consider ordering Semglee  10 units Q24H and Novolog  0-9 units TID with meals and Novolog  0-5 units at bedtime.  NOTE: Patient is in ED with hyperglycemia. Spoke with patient at bedside. Patient has speech impediment and hard to understand at times. Patient states that he takes Toujeo  20 units BID (stated "two zero" to verify dose; and "two times a day" to verify frequency). Patient stated he is taking Glipizide 5 mg BID but not sure about Actos. Patient states he gets all medications filled at [facility] in [place]. Patient states that his glucose is usually in the 100's but today it was in the 300's mg/dl.  Patient reports he lives alone and he takes medications as prescribed. Patient reports he has DM medications and testing supplies at home and does not need any refills.  Patient confirms that he sees Dr. Lewayne Records for primary care and DM management. [facility] pharmacy; informed that patient does not get any insulin  filled there and the only DM medication he gets filled is Glipizide 5 mg BID (last filled 02/10/23).  Question if insulin  is mail order since patient states he is taking insulin  20 units BID. Tried to call Vito Grippe (relative in chart) but forwarded to voice mail.   Thanks, Beacher Limerick, RN, MSN, CDCES Diabetes Coordinator Inpatient  Diabetes Program [phone] (Team Pager from 8am to 5pm)

## 2023-03-01 NOTE — H&P (Signed)
 [facility] H&P   Patient Demographics:    Name [name], is a 73 y.o. male  MRN: [id number]   DOB - 1950-03-11  Admit Date - 03/01/2023  Outpatient Primary MD for the patient is Minus Amel, MD  Referring MD/NP/PA: Dr Drury Geralds  Patient coming from: home  Chief Complaint  Patient presents with   Hyperglycemia   Hypertension   Fever      HPI:    [name]  is a 73 y.o. male,significant of cognitive impairment, anxiety, diabetes mellitus type 2, GERD, hyperlipidemia, hypertension,  renal insufficiency, history of right great toe osteomyelitis status post amputation by Dr. Julio Ohm in 2021, followed by transmetatarsal amputation of left great toe by Dr. Larrie Po in 2023, and history of abscess, osteomyelitis of right foot status post right TMA and wound VAC application by Dr. Julio Ohm in December 2023. -She presents to ED today secondary to multiple complaints, including hyperglycemia, fever, patient lives home by himself, he is with some communicative impairment, but has been living by himself and managing for years, patient comes to ED with complaints of chronic leg pain, as well as concerns of high blood sugar readings at home, overall he is poor historian, [place] nausea, vomiting, diarrhea, or any pain in the legs, patient reports he is compliant with his medications.. -In ED he was noted to be febrile of 99, tachycardic at 113, tachypneic at 27, lactic acid elevated at 2.3, trending up despite receiving fluid bolus, repeat was 2.4, white blood cell count within normal limit, glucose is elevated at 401, sodium is low at 129 due to pseudohyponatremia, bicarb within normal limit at 23, anion gap within normal limit at 9, MRI confirms right right first metatarsal resection margin acute osteomyelitis, ED discussed with orthopedic Dr. Phyllis Breeze who will evaluate the patient in a.m., blood cultures were  sent, patient was started on empiric antibiotic coverage and [facility] consulted to admit.   Review of systems:     A full 10 point Review of Systems was done, except as stated above, all other Review of Systems were negative.   With Past History of the following :    Past Medical History:  Diagnosis Date   Anemia    Anxiety    Arthritis    Colon polyps    Depression    Diabetes mellitus without complication (HCC)    DM (diabetes mellitus) (HCC) 01/30/2019   Fatty liver    GERD (gastroesophageal reflux disease)    Gout    High cholesterol    History of echocardiogram 09/14/2011   EF >55%; borderline concentric LVH;    History of stress test 06/21/2010   exercise; normal study   Hypertension    MET TEST 08/15/2011   moderate peak VO2 limitation at 66% predicted; mod cardiac impairment with low SV & low anaerobic threshold, mild chronotropic incompetence; low risk   Nausea  Osteomyelitis (HCC)    right great toe   Renal disorder    Renal insufficiency    Speech impediment    Thrombocytopenia (HCC)    PMH      Past Surgical History:  Procedure Laterality Date   AMPUTATION Right 02/13/2019   Procedure: RIGHT GREAT TOE AMPUTATION;  Surgeon: Timothy Ford, MD;  Location: [facility] OR;  Service: Orthopedics;  Laterality: Right;   AMPUTATION Right 02/04/2022   Procedure: RIGHT TRANSMETATARSAL AMPUTATION APPLICATION OF WOUND VAC;  Surgeon: Timothy Ford, MD;  Location: [facility] OR;  Service: Orthopedics;  Laterality: Right;   AMPUTATION TOE Left 12/02/2021   Procedure: AMPUTATION TOE;  Surgeon: Alanda Allegra, MD;  Location: [facility] ORS;  Service: General;  Laterality: Left;   AMPUTATION TOE Left 12/06/2021   Procedure: CLOSURE OF LEFT GREAT TOE AMPUTATION;  Surgeon: Alanda Allegra, MD;  Location: [facility] ORS;  Service: General;  Laterality: Left;   CHOLECYSTECTOMY     COLONOSCOPY N/A 09/21/2012   Procedure: COLONOSCOPY;  Surgeon: Ruby Corporal, MD;  Location: [facility];   Service: Endoscopy;  Laterality: N/A;  730   COLONOSCOPY N/A 01/30/2019   rehman, eight 4-7 mm polyps in rectum in sigmoid and at splenic flexure, transverse colon and in cecum, one 9mm polyp in recto-sigmoid colon, clip was placed, nine polyps total removed   ESOPHAGOGASTRODUODENOSCOPY  11/2005   normal mucosa and GE junction   HERNIA REPAIR     x 2   HIP SURGERY     Rt hip age 39   NASAL POLYP SURGERY      2-3 months ago   POLYPECTOMY  01/30/2019   Procedure: POLYPECTOMY;  Surgeon: Ruby Corporal, MD;  Location: [facility];  Service: Endoscopy;;  proximal transverse colon, cecal      Social History:     Social History   Tobacco Use   Smoking status: Never   Smokeless tobacco: Never  Substance Use Topics   Alcohol use: No       Family History :     Family History  Problem Relation Age of Onset   Colon cancer Brother       Home Medications:   Prior to Admission medications   Medication Sig Start Date End Date Taking? Authorizing Provider  ACCU-CHEK GUIDE test strip USE AS DIRECTED TO TEST TWICE DAILY 02/01/22   [provider]  acetaminophen  (TYLENOL ) 500 MG tablet Take 500-1,000 mg by mouth every 6 (six) hours as needed for mild pain or headache. Pain    [provider]  atorvastatin  (LIPITOR) 10 MG tablet Take 10 mg by mouth at bedtime. 09/21/17   [provider]  B Complex-C (B-COMPLEX WITH VITAMIN C) tablet Take 1 tablet by mouth daily.    [provider]  buPROPion  (WELLBUTRIN  XL) 150 MG 24 hr tablet Take 150 mg by mouth at bedtime.  08/29/12   [provider]  cetirizine (ZYRTEC) 10 MG tablet Take 10 mg by mouth daily as needed for allergies.    [provider]  Cholecalciferol  (VITAMIN D ) 50 MCG (2000 UT) CAPS Take 2,000 Units by mouth daily.     [provider]  ciprofloxacin  (CIPRO ) 500 MG tablet SMARTSIG:1 Tablet(s) By Mouth Every 12 Hours Patient not taking: Reported on 05/17/2022 04/08/22    [provider]  clindamycin (CLEOCIN) 300 MG capsule Take 300 mg by mouth every 6 (six) hours. Patient not taking: Reported on 05/17/2022 04/08/22   [provider]  ferrous  sulfate 325 (65 FE) MG EC tablet Take 325 mg by mouth daily with breakfast.    [provider]  glipiZIDE (GLUCOTROL) 5 MG tablet Take 5 mg by mouth 2 (two) times daily. Patient not taking: Reported on 05/17/2022 05/05/22   [provider]  hydrALAZINE  (APRESOLINE ) 25 MG tablet TAKE 1 TABLET BY MOUTH IN THE MORNING AND AT BEDTIME. MAY TAKE ADDITIONAL AS NEEDED FOR SYSTOLIC BLOOD PRESSURE CONSISTANTLY ABOVE 140 07/12/22   Lasalle Pointer, NP  HYDROcodone -acetaminophen  (NORCO/VICODIN) 5-325 MG tablet Take 1 tablet by mouth every 4 (four) hours as needed. Patient not taking: Reported on 05/17/2022 02/04/22   Timothy Ford, MD  labetalol  (NORMODYNE ) 100 MG tablet Take 100 mg by mouth 2 (two) times daily. Patient not taking: Reported on 05/17/2022 05/05/22   [provider]  loperamide  (IMODIUM ) 2 MG capsule TAKE ONE CAPSULE BY MOUTH TWO TIMES DAILY AS NEEDED FOR DIARRHEA OR LOOSE STOOLS 09/02/21   Carlan, Chelsea L, NP  metoprolol  succinate (TOPROL -XL) 100 MG 24 hr tablet TAKE 1 TABLET BY MOUTH DAILY. TAKE WITH OR IMMEDIATELY FOLLOWING A MEAL 08/08/22   Lasalle Pointer, NP  NIFEdipine  (ADALAT  CC) 90 MG 24 hr tablet Take 90 mg by mouth daily. Patient not taking: Reported on 05/17/2022 12/06/21   [provider]  Omega-3 Fatty Acids (FISH OIL) 1000 MG CAPS Take 1,000 mg by mouth 2 (two) times daily. Afternoon & bedtime.    [provider]  omeprazole  (PRILOSEC) 20 MG capsule TAKE ONE CAPSULE BY MOUTH DAILY. NEEDS OFFICE VISIT FOR FURTHER REFILLS. 01/24/22   Carlan, Chelsea L, NP  pioglitazone (ACTOS) 30 MG tablet Take 30 mg by mouth daily. Patient not taking: Reported on 05/17/2022 10/28/17   [provider]  sertraline  (ZOLOFT ) 100 MG tablet Take 100 mg by mouth at bedtime.   05/09/15   [provider]  sodium bicarbonate  650 MG tablet Take 650 mg by mouth 2 (two) times daily. 07/18/17   [provider]  sulfamethoxazole -trimethoprim  (BACTRIM  DS) 800-160 MG tablet Take 1 tablet by mouth 2 (two) times daily. Patient not taking: Reported on 05/17/2022 03/08/22   Reuben Castilla, NP  TOUJEO  MAX SOLOSTAR 300 UNIT/ML Solostar Pen Inject 10 Units into the skin daily. Patient taking differently: Inject 20 Units into the skin daily. 11/14/21   Johnson, Clanford L, MD  ULORIC  80 MG TABS Take 80 mg by mouth daily.  04/21/15   [provider]  Vitamin D , Ergocalciferol , (DRISDOL) 1.25 MG (50000 UNIT) CAPS capsule Take 50,000 Units by mouth once a week. Sundays Patient not taking: Reported on 05/17/2022 07/21/21   [provider]     Allergies:     Allergies  Allergen Reactions   Ivp Dye [Iodinated Contrast Media]     Sped heart reate up    Tetanus Toxoids Rash     Physical Exam:   Vitals  Blood pressure 135/81, pulse 100, temperature 98.5 F (36.9 C), temperature source Oral, resp. rate 15, height 6\' 5"  (1.956 m), weight 99.8 kg, SpO2 98%.   1. General , frail elderly male, appears older than stated age, laying in bed, no apparent distress   2.  Patient appears to be with some diminished cognition, but he is answering questions appropriately,, Not Suicidal or Homicidal, Awake Alert, Oriented X 3.  3. No F.N deficits, ALL C.Nerves Intact, Strength 5/5 all 4 extremities, Sensation intact all 4 extremities, Plantars down going.  Speech is difficult to understand, but no aphasia, no dysarthria  4. Ears and Eyes appear Normal, Conjunctivae clear, PERRLA.  Dry oral Mucosa.  5. Supple Neck, No JVD, No cervical lymphadenopathy appriciated, No Carotid Bruits.  6. Symmetrical Chest wall movement, Good air movement bilaterally, CTAB.  7. RRR, No Gallops, Rubs or Murmurs, No Parasternal Heave.  8. Positive Bowel Sounds, Abdomen Soft, No  tenderness, No organomegaly appriciated,No rebound -guarding or rigidity.  9.  No Cyanosis, Normal Skin Turgor, patient with right foot first metatarsal area ulcer, please see picture below  10. Good muscle tone,  joints appear normal , status post right TMA, left great toe amputation     Data Review:    CBC Recent Labs  Lab 03/01/23 0803  WBC 6.7  HGB 14.4  HCT 41.6  PLT 195  MCV 85.1  MCH 29.4  MCHC 34.6  RDW 13.2   ------------------------------------------------------------------------------------------------------------------  Chemistries  Recent Labs  Lab 03/01/23 0803  NA 129*  K 4.2  CL 97*  CO2 23  GLUCOSE 401*  BUN 28*  CREATININE 1.38*  CALCIUM  9.0  AST 18  ALT 17  ALKPHOS 79  BILITOT 0.9   ------------------------------------------------------------------------------------------------------------------ estimated creatinine clearance is 61.9 mL/min (A) (by C-G formula based on SCr of 1.38 mg/dL (H)). ------------------------------------------------------------------------------------------------------------------ No results for input(s): "TSH", "T4TOTAL", "T3FREE", "THYROIDAB" in the last 72 hours.  Invalid input(s): "FREET3"  Coagulation profile No results for input(s): "INR", "PROTIME" in the last 168 hours. ------------------------------------------------------------------------------------------------------------------- No results for input(s): "DDIMER" in the last 72 hours. -------------------------------------------------------------------------------------------------------------------  Cardiac Enzymes No results for input(s): "CKMB", "TROPONINI", "MYOGLOBIN" in the last 168 hours.  Invalid input(s): "CK" ------------------------------------------------------------------------------------------------------------------ No results found for:  "BNP"   ---------------------------------------------------------------------------------------------------------------  Urinalysis    Component Value Date/Time   COLORURINE STRAW (A) 03/01/2023 0955   APPEARANCEUR CLEAR 03/01/2023 0955   LABSPEC 1.016 03/01/2023 0955   PHURINE 5.0 03/01/2023 0955   GLUCOSEU >=500 (A) 03/01/2023 0955   HGBUR NEGATIVE 03/01/2023 0955   BILIRUBINUR NEGATIVE 03/01/2023 0955   KETONESUR NEGATIVE 03/01/2023 0955   PROTEINUR 100 (A) 03/01/2023 0955   UROBILINOGEN 0.2 08/28/2011 1210   NITRITE NEGATIVE 03/01/2023 0955   LEUKOCYTESUR NEGATIVE 03/01/2023 0955    ----------------------------------------------------------------------------------------------------------------   Imaging Results:    MR FOOT RIGHT W WO CONTRAST Result Date: 03/01/2023 CLINICAL DATA:  Nonhealing foot wound EXAM: MRI OF THE RIGHT FOOT WITHOUT AND WITH CONTRAST TECHNIQUE: Multiplanar, multisequence MR imaging of the right foot was performed before and after the administration of intravenous contrast. CONTRAST:  9mL GADAVIST  GADOBUTROL  1 MMOL/ML IV SOLN COMPARISON:  X-ray 03/01/2023, MRI 12/05/2018 FINDINGS: Bones/Joint/Cartilage Postsurgical changes from transmetatarsal amputation of the first through fifth rays. Erosive changes along the distal and plantar aspects of the first metatarsal resection margin with marrow edema and low T1 marrow signal compatible with acute osteomyelitis. Small amount of fluid is seen tracking within the medullary space of the first metatarsal. Patchy bone marrow edema within the medial cuneiform, navicular, and fourth metatarsal base without fracture or confluent low T1 marrow signal changes likely reflecting stress related changes. No evidence of acute fracture or dislocation. Ligaments Intact Lisfranc ligament. Muscles and Tendons Amputation in denervation changes of the musculotendinous structures. No intramuscular fluid collection. Soft tissues Soft tissue  ulceration at the plantar aspect of the forefoot underlying the distal first metatarsal. Surrounding edema. No organized fluid collection. IMPRESSION: 1. Appearance most compatible with acute osteomyelitis of the first metatarsal resection margin. 2. Soft tissue ulceration at the plantar aspect of the forefoot underlying the distal first metatarsal. 3. Patchy  bone marrow edema within the medial cuneiform, navicular, and fourth metatarsal base likely reflecting stress-related changes. Electronically Signed   By: Leverne Reading D.O.   On: 03/01/2023 15:24   DG Foot Complete Right Result Date: 03/01/2023 CLINICAL DATA:  Status post amputation of the right forefoot. EXAM: RIGHT FOOT COMPLETE - 3+ VIEW COMPARISON:  Right foot radiograph dated 02/06/2022. FINDINGS: Status post transmetatarsal amputation. There is no acute fracture or dislocation. Apparent area of cortical irregularity of the plantar aspect of the metatarsal on the lateral view may be chronic. Osteomyelitis is not excluded clinical correlation is recommended. There is diffuse thickening of the soft tissues of the stump. There is a 2 cm ulceration of the skin over the plantar stump. IMPRESSION: 1. Status post transmetatarsal amputation. 2. Ulceration of the skin over the plantar stone with diffuse soft tissue thickening and possible osteomyelitis. Clinical correlation is recommended. MRI or a white blood cell nuclear scan may provide better evaluation. Electronically Signed   By: Angus Bark M.D.   On: 03/01/2023 11:13   DG Chest Port 1 View Result Date: 03/01/2023 CLINICAL DATA:  Elevated blood sugar.  Fever. EXAM: PORTABLE CHEST 1 VIEW COMPARISON:  Chest radiograph dated May 17, 2022. FINDINGS: The heart size and mediastinal contours are within normal limits. No focal consolidation, sizeable pleural effusion, or pneumothorax. No acute osseous abnormality. IMPRESSION: No acute cardiopulmonary findings. Electronically Signed   By: Mannie Seek M.D.   On: 03/01/2023 08:33    EKG:  Vent. rate 105 BPM PR interval 145 ms QRS duration 106 ms QT/QTcB 336/444 ms P-R-T axes 67 -42 104 Sinus tachycardia Left axis deviation Abnormal R-wave progression, early transition Borderline repolarization abnormality No significant change since last tracing   Assessment & Plan:    Principal Problem:   Acute osteomyelitis (HCC) Active Problems:   Hypertension   Gastroesophageal reflux disease   DM (diabetes mellitus) (HCC)   Amputated great toe of right foot (HCC)   Chronic kidney disease   Stage 3a chronic kidney disease (HCC)   Hyperlipidemia   History of transmetatarsal amputation of right foot (HCC)   Lactic acidosis    Sepsis, present on admission, due to acute osteomyelitis right first metatarsal resection margin -Sepsis present on admission,, tachycardic with elevated lactic acid and a low-grade temp at 99 -This most likely related to acute osteomyelitis, the right first metatarsal resection margin, confirmed by MRI -Continue with IV fluids, trend lactic acid -Blood cultures were sent, continue with empiric antibiotic coverage vancomycin  and Zosyn  -Orthopedic Dr. Phyllis Breeze to evaluate patient for possible need of resection  Diabetes mellitus, type II, uncontrolled with hyperglycemia -Not in DKA -Patient reports he is compliant with his insulin , reports he is on Toujeo  20 units twice daily, glipizide, supposed to be on Actos not taking it -Diabetic coordinator input greatly appreciated, does not appear to be receiving meds from his local pharmacy, unclear if receiving by mail and -Will start on Semglee  15 units daily and uptitrate, will add insulin  sliding scale -Check A1c  CKD stage IIIb -Renal function at baseline, continue with IV fluids, avoid nephrotoxic medications   Hypertension  -Continue with home medications      DVT Prophylaxis  Lovenox   AM Labs Ordered, also please review Full Orders  Family  Communication: Admission, patients condition and plan of care including tests being ordered have been discussed with the patient  who indicate understanding and agree with the plan and Code Status.  Code Status Full  Likely DC  to  home  Condition GUARDED    Consults called: ortho Dr Phyllis Breeze by ED    Admission status: inpatinet    Time spent in minutes : 70 minutes   Seena Dadds M.D on 03/01/2023 at 4:53 PM   [facility] - Office  [phone]

## 2023-03-01 NOTE — Progress Notes (Deleted)
 Pharmacy Antibiotic Note  [name] is a 73 y.o. male admitted on 03/01/2023 with sepsis.  Pharmacy has been consulted for vancomycin  and zosyn  dosing.  Vancomycin  2g IV Q 24 hrs. Goal AUC 400-550. Expected AUC: 498 SCr used: 1.38  Zosyn  3.375g IV q8 hours  Height: 6\' 5"  (195.6 cm) Weight: 99.8 kg (220 lb) IBW/kg (Calculated) : 89.1  Temp (24hrs), Avg:98.8 F (37.1 C), Min:98.5 F (36.9 C), Max:99 F (37.2 C)  Recent Labs  Lab 03/01/23 0803 03/01/23 0931  WBC 6.7  --   CREATININE 1.38*  --   LATICACIDVEN 2.3* 2.4*    Estimated Creatinine Clearance: 61.9 mL/min (A) (by C-G formula based on SCr of 1.38 mg/dL (H)).    Allergies  Allergen Reactions   Ivp Dye [Iodinated Contrast Media]     Sped heart reate up    Tetanus Toxoids Rash   Thank you for allowing pharmacy to be a part of this patient's care.  Audra Blend PharmD., BCPS Clinical Pharmacist 03/01/2023 4:51 PM

## 2023-03-01 NOTE — ED Notes (Signed)
 Pt drinking cup of water  at this time. Denies other needs. Call light within reach, side rails up x2, bed locked and in lowest position.

## 2023-03-01 NOTE — ED Triage Notes (Signed)
 Pt arrived from home with [facility] for c/o blood sugar being high for the last few days. Upon arrival pt cbg was 361, bp- 232/110 with a hx and takes his meds daily. Pt stated he took his meds at 7am. Pt c/o bilateral leg pain form hips to calfs.

## 2023-03-02 ENCOUNTER — Encounter (HOSPITAL_COMMUNITY): Payer: Self-pay | Admitting: Internal Medicine

## 2023-03-02 DIAGNOSIS — M8610 Other acute osteomyelitis, unspecified site: Secondary | ICD-10-CM | POA: Diagnosis not present

## 2023-03-02 DIAGNOSIS — Z89431 Acquired absence of right foot: Secondary | ICD-10-CM

## 2023-03-02 DIAGNOSIS — Z794 Long term (current) use of insulin: Secondary | ICD-10-CM

## 2023-03-02 DIAGNOSIS — N183 Chronic kidney disease, stage 3 unspecified: Secondary | ICD-10-CM

## 2023-03-02 DIAGNOSIS — E785 Hyperlipidemia, unspecified: Secondary | ICD-10-CM

## 2023-03-02 DIAGNOSIS — E872 Acidosis, unspecified: Secondary | ICD-10-CM

## 2023-03-02 DIAGNOSIS — I10 Essential (primary) hypertension: Secondary | ICD-10-CM | POA: Diagnosis not present

## 2023-03-02 DIAGNOSIS — R739 Hyperglycemia, unspecified: Secondary | ICD-10-CM

## 2023-03-02 DIAGNOSIS — K219 Gastro-esophageal reflux disease without esophagitis: Secondary | ICD-10-CM | POA: Diagnosis not present

## 2023-03-02 DIAGNOSIS — N1831 Chronic kidney disease, stage 3a: Secondary | ICD-10-CM | POA: Diagnosis not present

## 2023-03-02 LAB — CBC
HCT: 39.1 % (ref 39.0–52.0)
Hemoglobin: 13.2 g/dL (ref 13.0–17.0)
MCH: 29.3 pg (ref 26.0–34.0)
MCHC: 33.8 g/dL (ref 30.0–36.0)
MCV: 86.9 fL (ref 80.0–100.0)
Platelets: 180 10*3/uL (ref 150–400)
RBC: 4.5 MIL/uL (ref 4.22–5.81)
RDW: 13.2 % (ref 11.5–15.5)
WBC: 5.8 10*3/uL (ref 4.0–10.5)
nRBC: 0 % (ref 0.0–0.2)

## 2023-03-02 LAB — GLUCOSE, CAPILLARY
Glucose-Capillary: 351 mg/dL — ABNORMAL HIGH (ref 70–99)
Glucose-Capillary: 269 mg/dL — ABNORMAL HIGH (ref 70–99)
Glucose-Capillary: 193 mg/dL — ABNORMAL HIGH (ref 70–99)
Glucose-Capillary: 231 mg/dL — ABNORMAL HIGH (ref 70–99)

## 2023-03-02 LAB — BASIC METABOLIC PANEL
Anion gap: 9 (ref 5–15)
BUN: 22 mg/dL (ref 8–23)
CO2: 23 mmol/L (ref 22–32)
Calcium: 8.6 mg/dL — ABNORMAL LOW (ref 8.9–10.3)
Chloride: 102 mmol/L (ref 98–111)
Creatinine, Ser: 1.36 mg/dL — ABNORMAL HIGH (ref 0.61–1.24)
GFR, Estimated: 56 mL/min — ABNORMAL LOW (ref >60)
Glucose, Bld: 221 mg/dL — ABNORMAL HIGH (ref 70–99)
Potassium: 4.1 mmol/L (ref 3.5–5.1)
Sodium: 134 mmol/L — ABNORMAL LOW (ref 135–145)

## 2023-03-02 LAB — CBG MONITORING, ED
Glucose-Capillary: 152 mg/dL — ABNORMAL HIGH (ref 70–99)
Glucose-Capillary: 174 mg/dL — ABNORMAL HIGH (ref 70–99)
Glucose-Capillary: 204 mg/dL — ABNORMAL HIGH (ref 70–99)

## 2023-03-02 LAB — SEDIMENTATION RATE: Sed Rate: 16 mm/h (ref 0–16)

## 2023-03-02 LAB — C-REACTIVE PROTEIN: CRP: <0.5 mg/dL (ref <1.0)

## 2023-03-02 MED ORDER — ONDANSETRON HCL 4 MG/2ML IJ SOLN
4.0000 mg | Freq: Four times a day (QID) | INTRAMUSCULAR | Status: DC | PRN
  Filled 2023-03-02: qty 2

## 2023-03-02 NOTE — Progress Notes (Signed)
Progress Note   Patient: [name] NWG:[id number] DOB: 1950/10/10 DOA: 03/01/2023     1 DOS: the patient was seen and examined on 03/02/2023   Brief hospital admission course: As per H&P written by Dr. Randol Kern on 03/01/2023 [name]  is a 73 y.o. male,significant of cognitive impairment, anxiety, diabetes mellitus type 2, GERD, hyperlipidemia, hypertension,  renal insufficiency, history of right great toe osteomyelitis status post amputation by Dr. Lajoyce Corners in 2021, followed by transmetatarsal amputation of left great toe by Dr. Lovell Sheehan in 2023, and history of abscess, osteomyelitis of right foot status post right TMA and wound VAC application by Dr. Lajoyce Corners in December 2023. -She presents to ED today secondary to multiple complaints, including hyperglycemia, fever, patient lives home by himself, he is with some communicative impairment, but has been living by himself and managing for years, patient comes to ED with complaints of chronic leg pain, as well as concerns of high blood sugar readings at home, overall he is poor historian, [place] nausea, vomiting, diarrhea, or any pain in the legs, patient reports he is compliant with his medications.. -In ED he was noted to be febrile of 99, tachycardic at 113, tachypneic at 27, lactic acid elevated at 2.3, trending up despite receiving fluid bolus, repeat was 2.4, white blood cell count within normal limit, glucose is elevated at 401, sodium is low at 129 due to pseudohyponatremia, bicarb within normal limit at 23, anion gap within normal limit at 9, MRI confirms right right first metatarsal resection margin acute osteomyelitis, ED discussed with orthopedic Dr. Romeo Apple who will evaluate the patient in a.m., blood cultures were sent, patient was started on empiric antibiotic coverage and [facility] hospitalist consulted to admit.  Assessment and Plan: 1-sepsis due to acute osteomyelitis of right first metatarsal resection margin -Patient met criteria for  sepsis at time of admission with tachycardia, fever, identify source of infection elevated lactic acid -Fluid resuscitation provided -At time of examination today patient without fever and hemodynamically stable. -Continue IV antibiotics -After discussing with orthopedic service plan is for antibiotic therapy and once stable discharge home with oral antibiotics and outpatient follow-up with Dr. Lajoyce Corners. -Continue as needed analgesics and supportive care.  2-type 2 diabetes mellitus uncontrolled with hyperglycemia -Continue sliding scale insulin and follow CBG fluctuation -Long-acting 15 units has also been started and we will plan to further adjust based on blood sugar behavior.  3-chronic kidney disease stage IIIa -Based on chart review and patient's GFR she qualifies for stage IIIa at baseline -Continue to follow renal function trend -Minimize nephrotoxic agents -Avoid hypotension and the use of contrast.  4-hypertension -Continue current antihypertensive agents -Heart healthy diet discussed with patient.  5-nausea -Patient reported intermittent nausea on presentation -Continue as needed antiemetics -Diet has been advanced as tolerated -Follow clinical response.  6-GERD -Continue PPI  7-history of gout -No acute flare currently appreciated -Continue treatment with Uloric  8-hyperlipidemia -Continue statin  9-history of depression/anxiety -Continue treatment with sertraline -No suicidal ideation or hallucinations appreciated.  10-pseudohyponatremia -In the setting of hyperglycemia time of admission -Corrected after stabilizing patient's blood sugar levels.   Subjective:  Afebrile, no chest pain, no shortness of breath.  Reporting feeling nauseated.  No further vomiting events.  Patient reports no feeling well, but expressed no pain in his right foot currently.  Physical Exam: Vitals:   03/02/23 0600 03/02/23 0700 03/02/23 0834 03/02/23 1332  BP: 131/62 120/60 (!)  176/82 (!) 113/44  Pulse: 83 79 94 88  Resp: 17  18 18 18   Temp: 98.5 F (36.9 C)  98.8 F (37.1 C) 99.1 F (37.3 C)  TempSrc:   Oral Oral  SpO2: 93% 93% 99% 95%  Weight:      Height:       General exam: Alert, awake and following commands appropriately; afebrile, no chest pain, no vomiting or shortness of breath. Respiratory system: Clear to auscultation. Respiratory effort normal.  Good saturation on room air. Cardiovascular system:RRR. No rubs or gallops. Gastrointestinal system: Abdomen is nondistended, soft and nontender. No organomegaly or masses felt. Normal bowel sounds heard. Central nervous system: Moving 4 limbs spontaneously; no focal deficit. Extremities/skin: No cyanosis or clubbing; right foot status post metatarsal amputation with wound in his right metatarsal resection margin.  Please refer to epic media for details.  No purulent discharge appreciated on examination. Psychiatry: Mood & affect appropriate.   Data Reviewed: Basic metabolic panel: Sodium 134, potassium 4.1, chloride 102, bicarb 23, glucose 221, BUN 22, creatinine 1.36 and GFR 56 CBC: WBCs 5.8, hemoglobin 13.2 platelet count 180K  Family Communication: No family at bedside.  Disposition: Status is: Inpatient Remains inpatient appropriate because: Continue IV antibiotics.   Planned Discharge Destination: Home  Time spent: 50 minutes  Author: Vassie Loll, MD 03/02/2023 2:35 PM  For on call review [email or web address].

## 2023-03-02 NOTE — Consult Note (Signed)
Reason for Consult: Diabetic foot ulcer Referring Physician: Vassie Loll MD  [name] is an 73 y.o. male.  HPI: 73 year old male diabetic presented with on plantar ulcer first metatarsal head status post transmetatarsal amputation x-ray and MRI changes showing osteomyelitis.  Patient was also noted to have hyperglycemia and high lactic acid  Recommend starting antibiotics and consulting foot and ankle service for definitive management   Past Medical History:  Diagnosis Date   Anemia    Anxiety    Arthritis    Colon polyps    Depression    Diabetes mellitus without complication (HCC)    DM (diabetes mellitus) (HCC) 01/30/2019   Fatty liver    GERD (gastroesophageal reflux disease)    Gout    High cholesterol    History of echocardiogram 09/14/2011   EF >55%; borderline concentric LVH;    History of stress test 06/21/2010   exercise; normal study   Hypertension    MET TEST 08/15/2011   moderate peak VO2 limitation at 66% predicted; mod cardiac impairment with low SV & low anaerobic threshold, mild chronotropic incompetence; low risk   Nausea    Osteomyelitis (HCC)    right great toe   Renal disorder    Renal insufficiency    Speech impediment    Thrombocytopenia (HCC)    PMH    Past Surgical History:  Procedure Laterality Date   AMPUTATION Right 02/13/2019   Procedure: RIGHT GREAT TOE AMPUTATION;  Surgeon: Nadara Mustard, MD;  Location[facility] OR;  Service: Orthopedics;  Laterality: Right;   AMPUTATION Right 02/04/2022   Procedure: RIGHT TRANSMETATARSAL AMPUTATION APPLICATION OF WOUND VAC;  Surgeon: Nadara Mustard, MD;  Location: [facility] OR;  Service: Orthopedics;  Laterality: Right;   AMPUTATION TOE Left 12/02/2021   Procedure: AMPUTATION TOE;  Surgeon: Franky Macho, MD;  Location: [facility] ORS;  Service: General;  Laterality: Left;   AMPUTATION TOE Left 12/06/2021   Procedure: CLOSURE OF LEFT GREAT TOE AMPUTATION;  Surgeon: Franky Macho, MD;  Location: [facility] ORS;  Service:  General;  Laterality: Left;   CHOLECYSTECTOMY     COLONOSCOPY N/A 09/21/2012   Procedure: COLONOSCOPY;  Surgeon: Malissa Hippo, MD;  Location: [facility] ENDO SUITE;  Service: Endoscopy;  Laterality: N/A;  730   COLONOSCOPY N/A 01/30/2019   rehman, eight 4-7 mm polyps in rectum in sigmoid and at splenic flexure, transverse colon and in cecum, one 9mm polyp in recto-sigmoid colon, clip was placed, nine polyps total removed   ESOPHAGOGASTRODUODENOSCOPY  11/2005   normal mucosa and GE junction   HERNIA REPAIR     x 2   HIP SURGERY     Rt hip age 15   NASAL POLYP SURGERY      2-3 months ago   POLYPECTOMY  01/30/2019   Procedure: POLYPECTOMY;  Surgeon: Malissa Hippo, MD;  Location: [facility] END[facility];  Service: Endoscopy;;  proximal transverse colon, cecal    Family History  Problem Relation Age of Onset   Colon cancer Brother     Social History:  reports that he has never smoked. He has never used smokeless tobacco. He reports that he does not drink alcohol and does not use drugs.  Allergies:  Allergies  Allergen Reactions   Ivp Dye [Iodinated Contrast Media] Other (See Comments)    Sped heart rate up    Tetanus Toxoids Rash      Results for orders placed or performed during the hospital encounter of 03/01/23 (from the past 48  hours)  CBG monitoring, ED     Status: Abnormal   Collection Time: 03/01/23  7:26 AM  Result Value Ref Range   Glucose-Capillary 370 (H) 70 - 99 mg/dL    Comment: Glucose reference range applies only to samples taken after fasting for at least 8 hours.  CBC     Status: None   Collection Time: 03/01/23  8:03 AM  Result Value Ref Range   WBC 6.7 4.0 - 10.5 K/uL   RBC 4.89 4.22 - 5.81 MIL/uL   Hemoglobin 14.4 13.0 - 17.0 g/dL   HCT 16.1 09.6 - 04.5 %   MCV 85.1 80.0 - 100.0 fL   MCH 29.4 26.0 - 34.0 pg   MCHC 34.6 30.0 - 36.0 g/dL   RDW 40.9 81.1 - 91.4 %   Platelets 195 150 - 400 K/uL   nRBC 0.0 0.0 - 0.2 %    Comment: Performed at [facility],  [address], [place], [place] [address]  Beta-hydroxybutyric acid     Status: None   Collection Time: 03/01/23  8:03 AM  Result Value Ref Range   Beta-Hydroxybutyric Acid 0.22 0.05 - 0.27 mmol/L    Comment: Performed at [facility], [address], [place], [place] [address]  Blood gas, venous (at [facility] and [facility])     Status: Abnormal   Collection Time: 03/01/23  8:03 AM  Result Value Ref Range   pH, Ven 7.41 7.25 - 7.43   pCO2, Ven 40 (L) 44 - 60 mmHg   pO2, Ven 58 (H) 32 - 45 mmHg   Bicarbonate 25.4 20.0 - 28.0 mmol/L   Acid-Base Excess 0.7 0.0 - 2.0 mmol/L   O2 Saturation 90.5 %   Patient temperature 37.2    Collection site LEFT ANTECUBITAL    Drawn by [phone]     Comment: Performed at [facility], [address], [place], [place] [address]  Comprehensive metabolic panel     Status: Abnormal   Collection Time: 03/01/23  8:03 AM  Result Value Ref Range   Sodium 129 (L) 135 - 145 mmol/L   Potassium 4.2 3.5 - 5.1 mmol/L   Chloride 97 (L) 98 - 111 mmol/L   CO2 23 22 - 32 mmol/L   Glucose, Bld 401 (H) 70 - 99 mg/dL    Comment: Glucose reference range applies only to samples taken after fasting for at least 8 hours.   BUN 28 (H) 8 - 23 mg/dL   Creatinine, Ser 9.52 (H) 0.61 - 1.24 mg/dL   Calcium 9.0 8.9 - 84.1 mg/dL   Total Protein 7.1 6.5 - 8.1 g/dL   Albumin 3.6 3.5 - 5.0 g/dL   AST 18 15 - 41 U/L   ALT 17 0 - 44 U/L   Alkaline Phosphatase 79 38 - 126 U/L   Total Bilirubin 0.9 0.0 - 1.2 mg/dL   GFR, Estimated 55 (L) >60 mL/min    Comment: (NOTE) Calculated using the CKD-EPI Creatinine Equation (2021)    Anion gap 9 5 - 15    Comment: Performed at [facility], [address] [place], [place] [address]  Culture, blood (Routine X 2) w Reflex to ID Panel     Status: None (Preliminary result)   Collection Time: 03/01/23  8:03 AM   Specimen: BLOOD  Result Value Ref Range   Specimen Description BLOOD BLOOD LEFT HAND    Special Requests      BOTTLES DRAWN AEROBIC ONLY Blood Culture  adequate volume  Culture      NO GROWTH < 24 HOURS Performed at [facility], [address], [place], [place] [address]    Report Status PENDING   Culture, blood (Routine X 2) w Reflex to ID Panel     Status: None (Preliminary result)   Collection Time: 03/01/23  8:03 AM   Specimen: BLOOD  Result Value Ref Range   Specimen Description BLOOD BLOOD RIGHT WRIST    Special Requests      BOTTLES DRAWN AEROBIC AND ANAEROBIC Blood Culture adequate volume   Culture      NO GROWTH < 24 HOURS Performed at [facility], [address] [place], [place] [address]    Report Status PENDING   Lactic acid, plasma     Status: Abnormal   Collection Time: 03/01/23  8:03 AM  Result Value Ref Range   Lactic Acid, Venous 2.3 (HH) 0.5 - 1.9 mmol/L    Comment: CRITICAL RESULT CALLED TO, READ BACK BY AND VERIFIED WITH KIRKSTUL,S AT 8:25AM ON 03/01/23 BY [facility] Performed at [facility], [address], [place], [place] [address]   Hemoglobin A1c     Status: Abnormal   Collection Time: 03/01/23  8:03 AM  Result Value Ref Range   Hgb A1c MFr Bld 10.7 (H) 4.8 - 5.6 %    Comment: (NOTE) Pre diabetes:          5.7%-6.4%  Diabetes:              >6.4%  Glycemic control for   <7.0% adults with diabetes    Mean Plasma Glucose 260.39 mg/dL    Comment: Performed at [facility], [address] [place], [place] [address]  Lactic acid, plasma     Status: Abnormal   Collection Time: 03/01/23  9:31 AM  Result Value Ref Range   Lactic Acid, Venous 2.4 (HH) 0.5 - 1.9 mmol/L    Comment: CRITICAL RESULT CALLED TO, READ BACK BY AND VERIFIED WITH EDWARDS,C AT 9:50AM ON 03/01/23 BY [place] Performed at [facility], [address], [place], [place] [address]   Urinalysis, Routine w reflex microscopic -Urine, Clean Catch     Status: Abnormal   Collection Time: 03/01/23  9:55 AM  Result Value Ref Range   Color, Urine STRAW (A) YELLOW   APPearance CLEAR CLEAR   Specific Gravity, Urine 1.016 1.005 -  1.030   pH 5.0 5.0 - 8.0   Glucose, UA >=500 (A) NEGATIVE mg/dL   Hgb urine dipstick NEGATIVE NEGATIVE   Bilirubin Urine NEGATIVE NEGATIVE   Ketones, ur NEGATIVE NEGATIVE mg/dL   Protein, ur 564 (A) NEGATIVE mg/dL   Nitrite NEGATIVE NEGATIVE   Leukocytes,Ua NEGATIVE NEGATIVE   RBC / HPF 0-5 0 - 5 RBC/hpf   WBC, UA 0-5 0 - 5 WBC/hpf   Bacteria, UA NONE SEEN NONE SEEN   Squamous Epithelial / HPF 0-5 0 - 5 /HPF   Mucus PRESENT     Comment: Performed at [facility], [address] [place], [place] [address]  CBG monitoring, ED     Status: Abnormal   Collection Time: 03/01/23 10:03 AM  Result Value Ref Range   Glucose-Capillary 329 (H) 70 - 99 mg/dL    Comment: Glucose reference range applies only to samples taken after fasting for at least 8 hours.  CBG monitoring, ED     Status: Abnormal   Collection Time: 03/01/23 12:17 PM  Result Value Ref Range   Glucose-Capillary 272 (H) 70 - 99  mg/dL    Comment: Glucose reference range applies only to samples taken after fasting for at least 8 hours.  CBG monitoring, ED     Status: Abnormal   Collection Time: 03/01/23  5:13 PM  Result Value Ref Range   Glucose-Capillary 191 (H) 70 - 99 mg/dL    Comment: Glucose reference range applies only to samples taken after fasting for at least 8 hours.  CBG monitoring, ED     Status: Abnormal   Collection Time: 03/01/23  9:16 PM  Result Value Ref Range   Glucose-Capillary 403 (H) 70 - 99 mg/dL    Comment: Glucose reference range applies only to samples taken after fasting for at least 8 hours.  CBG monitoring, ED     Status: Abnormal   Collection Time: 03/01/23 11:07 PM  Result Value Ref Range   Glucose-Capillary 188 (H) 70 - 99 mg/dL    Comment: Glucose reference range applies only to samples taken after fasting for at least 8 hours.  CBG monitoring, ED     Status: Abnormal   Collection Time: 03/01/23 11:57 PM  Result Value Ref Range   Glucose-Capillary 151 (H) 70 - 99 mg/dL    Comment: Glucose  reference range applies only to samples taken after fasting for at least 8 hours.  CBG monitoring, ED     Status: Abnormal   Collection Time: 03/02/23  1:27 AM  Result Value Ref Range   Glucose-Capillary 152 (H) 70 - 99 mg/dL    Comment: Glucose reference range applies only to samples taken after fasting for at least 8 hours.  CBG monitoring, ED     Status: Abnormal   Collection Time: 03/02/23  2:54 AM  Result Value Ref Range   Glucose-Capillary 174 (H) 70 - 99 mg/dL    Comment: Glucose reference range applies only to samples taken after fasting for at least 8 hours.  CBG monitoring, ED     Status: Abnormal   Collection Time: 03/02/23  4:16 AM  Result Value Ref Range   Glucose-Capillary 204 (H) 70 - 99 mg/dL    Comment: Glucose reference range applies only to samples taken after fasting for at least 8 hours.  Basic metabolic panel     Status: Abnormal   Collection Time: 03/02/23  4:22 AM  Result Value Ref Range   Sodium 134 (L) 135 - 145 mmol/L   Potassium 4.1 3.5 - 5.1 mmol/L   Chloride 102 98 - 111 mmol/L   CO2 23 22 - 32 mmol/L   Glucose, Bld 221 (H) 70 - 99 mg/dL    Comment: Glucose reference range applies only to samples taken after fasting for at least 8 hours.   BUN 22 8 - 23 mg/dL   Creatinine, Ser 7.82 (H) 0.61 - 1.24 mg/dL   Calcium 8.6 (L) 8.9 - 10.3 mg/dL   GFR, Estimated 56 (L) >60 mL/min    Comment: (NOTE) Calculated using the CKD-EPI Creatinine Equation (2021)    Anion gap 9 5 - 15    Comment: Performed at [facility], [address] [place], [place] [address]  CBC     Status: None   Collection Time: 03/02/23  4:22 AM  Result Value Ref Range   WBC 5.8 4.0 - 10.5 K/uL   RBC 4.50 4.22 - 5.81 MIL/uL   Hemoglobin 13.2 13.0 - 17.0 g/dL   HCT 30.8 65.7 - 84.6 %   MCV 86.9 80.0 - 100.0 fL   MCH 29.3 26.0 - 34.0  pg   MCHC 33.8 30.0 - 36.0 g/dL   RDW 47.8 29.5 - 62.1 %   Platelets 180 150 - 400 K/uL   nRBC 0.0 0.0 - 0.2 %    Comment: Performed at [facility], [address] [place] [address], [place], [place] [address]    MR FOOT RIGHT W WO CONTRAST Result Date: 03/01/2023 CLINICAL DATA:  Nonhealing foot wound EXAM: MRI OF THE RIGHT FOOT WITHOUT AND WITH CONTRAST TECHNIQUE: Multiplanar, multisequence MR imaging of the right foot was performed before and after the administration of intravenous contrast. CONTRAST:  9mL GADAVIST GADOBUTROL 1 MMOL/ML IV SOLN COMPARISON:  X-ray 03/01/2023, MRI 12/05/2018 FINDINGS: Bones/Joint/Cartilage Postsurgical changes from transmetatarsal amputation of the first through fifth rays. Erosive changes along the distal and plantar aspects of the first metatarsal resection margin with marrow edema and low T1 marrow signal compatible with acute osteomyelitis. Small amount of fluid is seen tracking within the medullary space of the first metatarsal. Patchy bone marrow edema within the medial cuneiform, navicular, and fourth metatarsal base without fracture or confluent low T1 marrow signal changes likely reflecting stress related changes. No evidence of acute fracture or dislocation. Ligaments Intact Lisfranc ligament. Muscles and Tendons Amputation in denervation changes of the musculotendinous structures. No intramuscular fluid collection. Soft tissues Soft tissue ulceration at the plantar aspect of the forefoot underlying the distal first metatarsal. Surrounding edema. No organized fluid collection. IMPRESSION: 1. Appearance most compatible with acute osteomyelitis of the first metatarsal resection margin. 2. Soft tissue ulceration at the plantar aspect of the forefoot underlying the distal first metatarsal. 3. Patchy bone marrow edema within the medial cuneiform, navicular, and fourth metatarsal base likely reflecting stress-related changes. Electronically Signed   By: Duanne Guess D.O.   On: 03/01/2023 15:24   DG Foot Complete Right Result Date: 03/01/2023 CLINICAL DATA:  Status post amputation of the right forefoot. EXAM: RIGHT FOOT COMPLETE -  3+ VIEW COMPARISON:  Right foot radiograph dated 02/06/2022. FINDINGS: Status post transmetatarsal amputation. There is no acute fracture or dislocation. Apparent area of cortical irregularity of the plantar aspect of the metatarsal on the lateral view may be chronic. Osteomyelitis is not excluded clinical correlation is recommended. There is diffuse thickening of the soft tissues of the stump. There is a 2 cm ulceration of the skin over the plantar stump. IMPRESSION: 1. Status post transmetatarsal amputation. 2. Ulceration of the skin over the plantar stone with diffuse soft tissue thickening and possible osteomyelitis. Clinical correlation is recommended. MRI or a white blood cell nuclear scan may provide better evaluation. Electronically Signed   By: Elgie Collard M.D.   On: 03/01/2023 11:13   DG Chest Port 1 View Result Date: 03/01/2023 CLINICAL DATA:  Elevated blood sugar.  Fever. EXAM: PORTABLE CHEST 1 VIEW COMPARISON:  Chest radiograph dated May 17, 2022. FINDINGS: The heart size and mediastinal contours are within normal limits. No focal consolidation, sizeable pleural effusion, or pneumothorax. No acute osseous abnormality. IMPRESSION: No acute cardiopulmonary findings. Electronically Signed   By: Hart Robinsons M.D.   On: 03/01/2023 08:33     Blood pressure 120/60, pulse 79, temperature 98.5 F (36.9 C), resp. rate 18, height 6\' 5"  (1.956 m), weight 99.8 kg, SpO2 93%.     Latest Ref Rng & Units 03/02/2023    4:22 AM 03/01/2023    8:03 AM 05/17/2022    1:02 PM  CBC  WBC 4.0 - 10.5 K/uL 5.8  6.7  5.8   Hemoglobin 13.0 - 17.0 g/dL  13.2  14.4  12.0   Hematocrit 39.0 - 52.0 % 39.1  41.6  37.6   Platelets 150 - 400 K/uL 180  195  184       Latest Ref Rng & Units 03/02/2023    4:22 AM 03/01/2023    8:03 AM 05/17/2022    1:02 PM  BMP  Glucose 70 - 99 mg/dL 403  474  259   BUN 8 - 23 mg/dL 22  28  25    Creatinine 0.61 - 1.24 mg/dL 5.63  8.75  6.43   Sodium 135 - 145 mmol/L 134  129  136    Potassium 3.5 - 5.1 mmol/L 4.1  4.2  4.1   Chloride 98 - 111 mmol/L 102  97  103   CO2 22 - 32 mmol/L 23  23  24    Calcium 8.9 - 10.3 mg/dL 8.6  9.0  9.2    Lactic Acid, Venous    Component Value Date/Time   LATICACIDVEN 2.4 (HH) 03/01/2023 0931     Fuller Canada 03/02/2023, 8:07 AM

## 2023-03-02 NOTE — Progress Notes (Addendum)
Zofran effective.  Eating supper now. Ate all of supper

## 2023-03-02 NOTE — Plan of Care (Signed)
  Problem: Acute Rehab PT Goals(only PT should resolve) Goal: Pt Will Go Supine/Side To Sit Flowsheets (Taken 03/02/2023 1557) Pt will go Supine/Side to Sit: with modified independence Goal: Patient Will Transfer Sit To/From Stand Flowsheets (Taken 03/02/2023 1557) Patient will transfer sit to/from stand: with modified independence Goal: Pt Will Transfer Bed To Chair/Chair To Bed Flowsheets (Taken 03/02/2023 1557) Pt will Transfer Bed to Chair/Chair to Bed: with modified independence Goal: Pt Will Ambulate Flowsheets (Taken 03/02/2023 1557) Pt will Ambulate:  > 125 feet  with modified independence  with rolling walker   Searcy Miyoshi SPT

## 2023-03-02 NOTE — Evaluation (Signed)
Physical Therapy Evaluation Patient Details Name: [name] MRN: [id number] DOB: 04-02-1950 Today's Date: 03/02/2023  History of Present Illness  [name]  is a 73 y.o. male,significant of cognitive impairment, anxiety, diabetes mellitus type 2, GERD, hyperlipidemia, hypertension,  renal insufficiency, history of right great toe osteomyelitis status post amputation by Dr. Lajoyce Corners in 2021, followed by transmetatarsal amputation of left great toe by Dr. Lovell Sheehan in 2023, and history of abscess, osteomyelitis of right foot status post right TMA and wound VAC application by Dr. Lajoyce Corners in December 2023.  -She presents to ED today secondary to multiple complaints, including hyperglycemia, fever, patient lives home by himself, he is with some communicative impairment, but has been living by himself and managing for years, patient comes to ED with complaints of chronic leg pain, as well as concerns of high blood sugar readings at home, overall he is poor historian, [place] nausea, vomiting, diarrhea, or any pain in the legs, patient reports he is compliant with his medications..  -In ED he was noted to be febrile of 99, tachycardic at 113, tachypneic at 27, lactic acid elevated at 2.3, trending up despite receiving fluid bolus, repeat was 2.4, white blood cell count within normal limit, glucose is elevated at 401, sodium is low at 129 due to pseudohyponatremia, bicarb within normal limit at 23, anion gap within normal limit at 9, MRI confirms right right first metatarsal resection margin acute osteomyelitis, ED discussed with orthopedic Dr. Romeo Apple who will evaluate the patient in a.m., blood cultures were sent, patient was started on empiric antibiotic coverage and [facility] hospitalist consulted to admit.   Clinical Impression  Pt was agreeable to therapy and tolerated session. Patient was able to transfer from supine to sit with supervision. Patient demonstrated transfer from bed to chair with supervision. When  ambulating in room and hallway patient used RW with CTG from PT. PT recommended patient use RW when ambulating to help relieve some stress on right foot. PT used verbal/tactile cueing when patient performed bed mobility and bed to chair transfer. Patient will benefit from continued skilled physical therapy in hospital and recommended venue below to increase strength, balance, endurance for safe ADLs and gait.          If plan is discharge home, recommend the following: Help with stairs or ramp for entrance;Assistance with cooking/housework;A little help with bathing/dressing/bathroom;A little help with walking and/or transfers   Can travel by private vehicle        Equipment Recommendations None recommended by PT  Recommendations for Other Services       Functional Status Assessment Patient has had a recent decline in their functional status and demonstrates the ability to make significant improvements in function in a reasonable and predictable amount of time.     Precautions / Restrictions Precautions Precautions: Fall Restrictions Weight Bearing Restrictions Per Provider Order: No      Mobility  Bed Mobility Overal bed mobility: Independent, Modified Independent               Patient Response: Cooperative  Transfers Overall transfer level: Modified independent Equipment used: Rolling walker (2 wheels)                    Ambulation/Gait Ambulation/Gait assistance: Supervision Gait Distance (Feet): 100 Feet Assistive device: Rolling walker (2 wheels) Gait Pattern/deviations: Step-to pattern, Decreased step length - right, Decreased step length - left, Decreased stride length Gait velocity: Decreased     General Gait Details: More weight on  the left side, due to TMA on right foot  Stairs            Wheelchair Mobility     Tilt Bed Tilt Bed Patient Response: Cooperative  Modified Rankin (Stroke Patients Only)       Balance Overall balance  assessment: Needs assistance Sitting-balance support: Feet supported, No upper extremity supported Sitting balance-Leahy Scale: Good Sitting balance - Comments: Seated at EOB   Standing balance support: No upper extremity supported, During functional activity Standing balance-Leahy Scale: Fair Standing balance comment: Fair/good using RW                             Pertinent Vitals/Pain Pain Assessment Pain Assessment: No/denies pain    Home Living Family/patient expects to be discharged to:: Private residence Living Arrangements: Alone Available Help at Discharge: Family Type of Home: House Home Access: Level entry       Home Layout: One level Home Equipment: Agricultural consultant (2 wheels)      Prior Function Prior Level of Function : Independent/Modified Independent             Mobility Comments: Community ambulation with AD ADLs Comments: Independent with ADLs, assistance with Groceries     Extremity/Trunk Assessment        Lower Extremity Assessment Lower Extremity Assessment: Overall WFL for tasks assessed       Communication   Communication Communication: Difficulty communicating thoughts/reduced clarity of speech Cueing Techniques: Verbal cues;Tactile cues  Cognition Arousal: Alert Behavior During Therapy: WFL for tasks assessed/performed Overall Cognitive Status: Within Functional Limits for tasks assessed                                          General Comments      Exercises     Assessment/Plan    PT Assessment Patient needs continued PT services  PT Problem List Decreased strength;Decreased range of motion;Decreased activity tolerance;Decreased balance;Decreased mobility;Decreased coordination       PT Treatment Interventions DME instruction;Gait training;Stair training;Functional mobility training;Therapeutic activities;Therapeutic exercise;Balance training;Patient/family education    PT Goals (Current goals  can be found in the Care Plan section)  Acute Rehab PT Goals Patient Stated Goal: To return home PT Goal Formulation: With patient Time For Goal Achievement: 03/09/23 Potential to Achieve Goals: Good    Frequency Min 2X/week     Co-evaluation               AM-PAC PT "6 Clicks" Mobility  Outcome Measure Help needed turning from your back to your side while in a flat bed without using bedrails?: None Help needed moving from lying on your back to sitting on the side of a flat bed without using bedrails?: None Help needed moving to and from a bed to a chair (including a wheelchair)?: A Little Help needed standing up from a chair using your arms (e.g., wheelchair or bedside chair)?: A Little Help needed to walk in hospital room?: A Little Help needed climbing 3-5 steps with a railing? : A Little 6 Click Score: 20    End of Session   Activity Tolerance: Patient tolerated treatment well Patient left: in bed;with call bell/phone within reach Nurse Communication: Mobility status PT Visit Diagnosis: Unsteadiness on feet (R26.81);Other abnormalities of gait and mobility (R26.89);Muscle weakness (generalized) (M62.81)    Time: 9528-4132 PT Time Calculation (  min) (ACUTE ONLY): 22 min   Charges:   PT Evaluation $PT Eval Moderate Complexity: 1 Mod PT Treatments $Therapeutic Activity: 8-22 mins PT General Charges $$ ACUTE PT VISIT: 1 Visit         Zorana Brockwell SPT

## 2023-03-02 NOTE — Progress Notes (Signed)
Mobility Specialist Progress Note:    03/02/23 1143  Mobility  Activity Ambulated with assistance to bathroom;Dangled on edge of bed  Level of Assistance Minimal assist, patient does 75% or more  Assistive Device None  Distance Ambulated (ft) 25 ft  Range of Motion/Exercises Active;All extremities  Activity Response Tolerated well  Mobility Referral Yes  Mobility visit 1 Mobility  Mobility Specialist Start Time (ACUTE ONLY) 1115  Mobility Specialist Stop Time (ACUTE ONLY) 1135  Mobility Specialist Time Calculation (min) (ACUTE ONLY) 20 min   Pt received in bed, requesting assistance to bathroom. Required MinA to stand and ambulate with no AD. Tolerated well, asx throughout. Left pt sitting EOB, alarm on. All needs met.   Lawerance Bach Mobility Specialist Please contact via Special educational needs teacher or  Rehab office at [phone]

## 2023-03-02 NOTE — Progress Notes (Addendum)
C/O nausea and vomiting on arrival to floor.  Was later able to hold down pills.  Received order for zofran and was just given.  Ate about 50% of lunch.  Foam dressing placed on right foot wound which is 1 cm deep and 2 cm round and has mostly red base with scant amount of bleeding.  Able to ambulate to bathroom for bm. States uses walker at home when outside but not in home. Speech a little unclear

## 2023-03-02 NOTE — Progress Notes (Signed)
Patient ID: [name], male   DOB: 01-21-1951, 73 y.o.   MRN: [id number]  I spoke to the foot and ankle service   They rec f/u as outpatient once hyperglycemia corrected   F/U with Dr Lajoyce Corners

## 2023-03-02 NOTE — TOC Initial Note (Signed)
Transition of Care [facility]) - Initial/Assessment Note    Patient Details  Name: [name] MRN: [id number] Date of Birth: [birth year]  [facility] of [facility]) CM/SW Contact:    Karn Cassis, LCSW Phone Number: 03/02/2023, 3:18 PM  Clinical Narrative:  Pt admitted due to acute osteomyelitis. Pt reports he lives alone and is independent with ADLs at baseline. He ambulates with a walker outside of the home. PT evaluated pt and recommend HHPT. Discussed with pt who states [facility] set up home health when he was there. However, pt does not remember what agency. TOC will attempt to determine [facility] agency. Will likely need HHPT/RN. TOC will follow.                  Expected Discharge Plan: Home w Home Health Services Barriers to Discharge: Continued Medical Work up   Patient Goals and CMS Choice Patient states their goals for this hospitalization and ongoing recovery are:: return home   Choice offered to / list presented to : Patient  ownership interest in [facility].provided to::  (n/a)    Expected Discharge Plan and Services In-house Referral: Clinical Social Work   Post Acute Care Choice: Home Health Living arrangements for the past 2 months: Single Family Home                                      Prior Living Arrangements/Services Living arrangements for the past 2 months: Single Family Home Lives with:: Self Patient language and need for interpreter reviewed:: Yes Do you feel safe going back to the place where you live?: Yes      Need for Family Participation in Patient Care: No (Comment)   Current home services: DME (walker) Criminal Activity/Legal Involvement Pertinent to Current Situation/Hospitalization: No - Comment as needed  Activities of Daily Living   ADL Screening (condition at time of admission) Independently performs ADLs?: Yes (appropriate for developmental age) Is the patient deaf or have difficulty hearing?: No Does the  patient have difficulty seeing, even when wearing glasses/contacts?: No Does the patient have difficulty concentrating, remembering, or making decisions?: No  Permission Sought/Granted                  Emotional Assessment     Affect (typically observed): Appropriate Orientation: : Oriented to Self, Oriented to Place, Oriented to  Time, Oriented to Situation Alcohol / Substance Use: Not Applicable Psych Involvement: No (comment)  Admission diagnosis:  Hyperglycemia [R73.9] Acute osteomyelitis (HCC) [M86.10] Acute osteomyelitis of ankle or foot, right (HCC) [M86.171] Patient Active Problem List   Diagnosis Date Noted   Acute osteomyelitis (HCC) 03/01/2023   Lactic acidosis 03/01/2023   History of transmetatarsal amputation of right foot (HCC) 04/06/2022   Cutaneous abscess of right foot 02/04/2022   Osteomyelitis of great toe of left foot (HCC)    Hyperlipidemia 11/30/2021   Bedbug bite with infection 11/30/2021    Class: Acute   AKI (acute kidney injury) (HCC) 11/12/2021   Sepsis (HCC) 11/11/2021   Amputated great toe of right foot (HCC) 02/20/2019   Stage 3a chronic kidney disease (HCC) 02/13/2019   Osteomyelitis (HCC)    DM (diabetes mellitus) (HCC) 01/30/2019   Fatty liver 01/09/2019   Thrombocytopenia, unspecified (HCC) 05/14/2013   Other specified abnormal findings of blood chemistry 05/14/2013   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 [place] [address] Phone: [phone] Fax: [phone]     Social Drivers of Health (SDOH) Social History: SDOH Screenings   Food  Insecurity: No Food Insecurity (03/01/2023)  Housing: Low Risk  (03/01/2023)  Transportation Needs: No Transportation Needs (03/01/2023)  Utilities: Not At Risk (03/01/2023)  Financial Resource Strain: Low Risk  (12/13/2021)  Social Connections: Socially Isolated (03/01/2023)  Tobacco Use: Low Risk  (03/02/2023)   SDOH Interventions:     Readmission Risk Interventions    11/30/2021    2:51 PM  Readmission Risk Prevention Plan  Transportation Screening Complete  HRI or Home Care Consult Complete  Social Work Consult for Recovery Care Planning/Counseling Complete  Palliative Care Screening Not Applicable  Medication Review Oceanographer) Complete

## 2023-03-02 NOTE — ED Notes (Signed)
ED TO INPATIENT HANDOFF REPORT  ED Nurse Name and Phone #: [phone] Pilar Jarvis Name/Age/Gender [name] 73 y.o. male Room/Bed: APA04/APA04  Code Status   Code Status: Full Code  Home/SNF/Other Home Patient oriented to: self, place, time, and situation Is this baseline? Yes   Triage Complete: Triage complete  Chief Complaint Acute osteomyelitis (HCC) [M86.10]  Triage Note Pt arrived from home with [facility] for c/o blood sugar being high for the last few days. Upon arrival pt cbg was 361, bp- 232/110 with a hx and takes his meds daily. Pt stated he took his meds at 7am. Pt c/o bilateral leg pain form hips to calfs.     Allergies Allergies  Allergen Reactions   Ivp Dye [Iodinated Contrast Media] Other (See Comments)    Sped heart rate up    Tetanus Toxoids Rash    Level of Care/Admitting Diagnosis ED Disposition     ED Disposition  Admit   Condition  --   Comment  Hospital Area: [facility] [[id number]]  Level of Care: Telemetry [5]  Covid Evaluation: Asymptomatic - no recent exposure (last 10 days) testing not required  Diagnosis: Acute osteomyelitis [facility]) [[id number]]  Admitting Physician: Chiquita Loth  Attending Physician: Starleen Arms [[id number]]  Certification:: I certify this patient will need inpatient services for at least 2 midnights  Expected Medical Readiness: 03/05/2023          B Medical/Surgery History Past Medical History:  Diagnosis Date   Anemia    Anxiety    Arthritis    Colon polyps    Depression    Diabetes mellitus without complication (HCC)    DM (diabetes mellitus) (HCC) 01/30/2019   Fatty liver    GERD (gastroesophageal reflux disease)    Gout    High cholesterol    History of echocardiogram 09/14/2011   EF >55%; borderline concentric LVH;    History of stress test 06/21/2010   exercise; normal study   Hypertension    MET TEST 08/15/2011   moderate peak VO2 limitation at 66% predicted; mod cardiac  impairment with low SV & low anaerobic threshold, mild chronotropic incompetence; low risk   Nausea    Osteomyelitis (HCC)    right great toe   Renal disorder    Renal insufficiency    Speech impediment    Thrombocytopenia (HCC)    PMH   Past Surgical History:  Procedure Laterality Date   AMPUTATION Right 02/13/2019   Procedure: RIGHT GREAT TOE AMPUTATION;  Surgeon: Nadara Mustard, MD;  Location: [facility] OR;  Service: Orthopedics;  Laterality: Right;   AMPUTATION Right 02/04/2022   Procedure: RIGHT TRANSMETATARSAL AMPUTATION APPLICATION OF WOUND VAC;  Surgeon: Nadara Mustard, MD;  Location: [facility] OR;  Service: Orthopedics;  Laterality: Right;   AMPUTATION TOE Left 12/02/2021   Procedure: AMPUTATION TOE;  Surgeon: Franky Macho, MD;  Location: [facility] ORS;  Service: General;  Laterality: Left;   AMPUTATION TOE Left 12/06/2021   Procedure: CLOSURE OF LEFT GREAT TOE AMPUTATION;  Surgeon: Franky Macho, MD;  Location: [facility] ORS;  Service: General;  Laterality: Left;   CHOLECYSTECTOMY     COLONOSCOPY N/A 09/21/2012   Procedure: COLONOSCOPY;  Surgeon: Malissa Hippo, MD;  Location: [facility] ENDO SUITE;  Service: Endoscopy;  Laterality: N/A;  730   COLONOSCOPY N/A 01/30/2019   rehman, eight 4-7 mm polyps in rectum in sigmoid and at splenic flexure, transverse colon and in cecum, one 9mm polyp in recto-sigmoid colon, clip  was placed, nine polyps total removed   ESOPHAGOGASTRODUODENOSCOPY  11/2005   normal mucosa and GE junction   HERNIA REPAIR     x 2   HIP SURGERY     Rt hip age 72   NASAL POLYP SURGERY      2-3 months ago   POLYPECTOMY  01/30/2019   Procedure: POLYPECTOMY;  Surgeon: Malissa Hippo, MD;  Location: [facility] END[facility] SU[facility];  Service: Endoscopy;;  proximal transverse colon, cecal     A IV Location/Drains/Wounds Patient Lines/Drains/Airways Status     Active Line/Drains/Airways     Name Placement date Placement time Site Days   Peripheral IV 03/01/23 Left;Posterior Hand 03/01/23  0755  Hand  1    Negative Pressure Wound Therapy Foot Right 02/04/22  --  --  391            Intake/Output Last 24 hours  Intake/Output Summary (Last 24 hours) at 03/02/2023 0711 Last data filed at 03/01/2023 1725 Gross per 24 hour  Intake 1511.74 ml  Output 450 ml  Net 1061.74 ml    Labs/Imaging Results for orders placed or performed during the hospital encounter of 03/01/23 (from the past 48 hours)  CBG monitoring, ED     Status: Abnormal   Collection Time: 03/01/23  7:26 AM  Result Value Ref Range   Glucose-Capillary 370 (H) 70 - 99 mg/dL    Comment: Glucose reference range applies only to samples taken after fasting for at least 8 hours.  CBC     Status: None   Collection Time: 03/01/23  8:03 AM  Result Value Ref Range   WBC 6.7 4.0 - 10.5 K/uL   RBC 4.89 4.22 - 5.81 MIL/uL   Hemoglobin 14.4 13.0 - 17.0 g/dL   HCT 16.1 09.6 - 04.5 %   MCV 85.1 80.0 - 100.0 fL   MCH 29.4 26.0 - 34.0 pg   MCHC 34.6 30.0 - 36.0 g/dL   RDW 40.9 81.1 - 91.4 %   Platelets 195 150 - 400 K/uL   nRBC 0.0 0.0 - 0.2 %    Comment: Performed at [facility], [address], [place], [place] [address]  Beta-hydroxybutyric acid     Status: None   Collection Time: 03/01/23  8:03 AM  Result Value Ref Range   Beta-Hydroxybutyric Acid 0.22 0.05 - 0.27 mmol/L    Comment: Performed at [facility], [address], [place], [place] [address]  Blood gas, venous (at [facility] and [facility])     Status: Abnormal   Collection Time: 03/01/23  8:03 AM  Result Value Ref Range   pH, Ven 7.41 7.25 - 7.43   pCO2, Ven 40 (L) 44 - 60 mmHg   pO2, Ven 58 (H) 32 - 45 mmHg   Bicarbonate 25.4 20.0 - 28.0 mmol/L   Acid-Base Excess 0.7 0.0 - 2.0 mmol/L   O2 Saturation 90.5 %   Patient temperature 37.2    Collection site LEFT ANTECUBITAL    Drawn by [phone]     Comment: Performed at [facility], [address] [place], [place] [address]  Comprehensive metabolic panel     Status: Abnormal   Collection Time: 03/01/23  8:03 AM  Result Value Ref  Range   Sodium 129 (L) 135 - 145 mmol/L   Potassium 4.2 3.5 - 5.1 mmol/L   Chloride 97 (L) 98 - 111 mmol/L   CO2 23 22 - 32 mmol/L   Glucose, Bld 401 (H) 70 - 99 mg/dL  Comment: Glucose reference range applies only to samples taken after fasting for at least 8 hours.   BUN 28 (H) 8 - 23 mg/dL   Creatinine, Ser 2.13 (H) 0.61 - 1.24 mg/dL   Calcium 9.0 8.9 - 08.6 mg/dL   Total Protein 7.1 6.5 - 8.1 g/dL   Albumin 3.6 3.5 - 5.0 g/dL   AST 18 15 - 41 U/L   ALT 17 0 - 44 U/L   Alkaline Phosphatase 79 38 - 126 U/L   Total Bilirubin 0.9 0.0 - 1.2 mg/dL   GFR, Estimated 55 (L) >60 mL/min    Comment: (NOTE) Calculated using the CKD-EPI Creatinine Equation (2021)    Anion gap 9 5 - 15    Comment: Performed at [facility], [address], [place], [place] [address]  Culture, blood (Routine X 2) w Reflex to ID Panel     Status: None (Preliminary result)   Collection Time: 03/01/23  8:03 AM   Specimen: BLOOD  Result Value Ref Range   Specimen Description BLOOD BLOOD LEFT HAND    Special Requests      BOTTLES DRAWN AEROBIC ONLY Blood Culture adequate volume   Culture      NO GROWTH < 24 HOURS Performed at [facility], [address] [place], [place] [address]    Report Status PENDING   Culture, blood (Routine X 2) w Reflex to ID Panel     Status: None (Preliminary result)   Collection Time: 03/01/23  8:03 AM   Specimen: BLOOD  Result Value Ref Range   Specimen Description BLOOD BLOOD RIGHT WRIST    Special Requests      BOTTLES DRAWN AEROBIC AND ANAEROBIC Blood Culture adequate volume   Culture      NO GROWTH < 24 HOURS Performed at [facility], [address] [place], [place] [address]    Report Status PENDING   Lactic acid, plasma     Status: Abnormal   Collection Time: 03/01/23  8:03 AM  Result Value Ref Range   Lactic Acid, Venous 2.3 (HH) 0.5 - 1.9 mmol/L    Comment: CRITICAL RESULT CALLED TO, READ BACK BY AND VERIFIED WITH KIRKSTUL,S AT 8:25AM ON 03/01/23 BY  [facility] Performed at [facility], [address] [place], [place] [address]   Hemoglobin A1c     Status: Abnormal   Collection Time: 03/01/23  8:03 AM  Result Value Ref Range   Hgb A1c MFr Bld 10.7 (H) 4.8 - 5.6 %    Comment: (NOTE) Pre diabetes:          5.7%-6.4%  Diabetes:              >6.4%  Glycemic control for   <7.0% adults with diabetes    Mean Plasma Glucose 260.39 mg/dL    Comment: Performed at [facility], [address] [place], [place] [address]  Lactic acid, plasma     Status: Abnormal   Collection Time: 03/01/23  9:31 AM  Result Value Ref Range   Lactic Acid, Venous 2.4 (HH) 0.5 - 1.9 mmol/L    Comment: CRITICAL RESULT CALLED TO, READ BACK BY AND VERIFIED WITH EDWARDS,C AT 9:50AM ON 03/01/23 BY [facility] Performed at [facility], [address], [place], [place] [address]   Urinalysis, Routine w reflex microscopic -Urine, Clean Catch     Status: Abnormal   Collection Time: 03/01/23  9:55 AM  Result Value Ref Range   Color, Urine STRAW (A) YELLOW   APPearance  CLEAR CLEAR   Specific Gravity, Urine 1.016 1.005 - 1.030   pH 5.0 5.0 - 8.0   Glucose, UA >=500 (A) NEGATIVE mg/dL   Hgb urine dipstick NEGATIVE NEGATIVE   Bilirubin Urine NEGATIVE NEGATIVE   Ketones, ur NEGATIVE NEGATIVE mg/dL   Protein, ur 161 (A) NEGATIVE mg/dL   Nitrite NEGATIVE NEGATIVE   Leukocytes,Ua NEGATIVE NEGATIVE   RBC / HPF 0-5 0 - 5 RBC/hpf   WBC, UA 0-5 0 - 5 WBC/hpf   Bacteria, UA NONE SEEN NONE SEEN   Squamous Epithelial / HPF 0-5 0 - 5 /HPF   Mucus PRESENT     Comment: Performed at [facility], [address], [place], [place] [address]  CBG monitoring, ED     Status: Abnormal   Collection Time: 03/01/23 10:03 AM  Result Value Ref Range   Glucose-Capillary 329 (H) 70 - 99 mg/dL    Comment: Glucose reference range applies only to samples taken after fasting for at least 8 hours.  CBG monitoring, ED     Status: Abnormal   Collection Time: 03/01/23 12:17 PM  Result  Value Ref Range   Glucose-Capillary 272 (H) 70 - 99 mg/dL    Comment: Glucose reference range applies only to samples taken after fasting for at least 8 hours.  CBG monitoring, ED     Status: Abnormal   Collection Time: 03/01/23  5:13 PM  Result Value Ref Range   Glucose-Capillary 191 (H) 70 - 99 mg/dL    Comment: Glucose reference range applies only to samples taken after fasting for at least 8 hours.  CBG monitoring, ED     Status: Abnormal   Collection Time: 03/01/23  9:16 PM  Result Value Ref Range   Glucose-Capillary 403 (H) 70 - 99 mg/dL    Comment: Glucose reference range applies only to samples taken after fasting for at least 8 hours.  CBG monitoring, ED     Status: Abnormal   Collection Time: 03/01/23 11:07 PM  Result Value Ref Range   Glucose-Capillary 188 (H) 70 - 99 mg/dL    Comment: Glucose reference range applies only to samples taken after fasting for at least 8 hours.  CBG monitoring, ED     Status: Abnormal   Collection Time: 03/01/23 11:57 PM  Result Value Ref Range   Glucose-Capillary 151 (H) 70 - 99 mg/dL    Comment: Glucose reference range applies only to samples taken after fasting for at least 8 hours.  CBG monitoring, ED     Status: Abnormal   Collection Time: 03/02/23  1:27 AM  Result Value Ref Range   Glucose-Capillary 152 (H) 70 - 99 mg/dL    Comment: Glucose reference range applies only to samples taken after fasting for at least 8 hours.  CBG monitoring, ED     Status: Abnormal   Collection Time: 03/02/23  2:54 AM  Result Value Ref Range   Glucose-Capillary 174 (H) 70 - 99 mg/dL    Comment: Glucose reference range applies only to samples taken after fasting for at least 8 hours.  CBG monitoring, ED     Status: Abnormal   Collection Time: 03/02/23  4:16 AM  Result Value Ref Range   Glucose-Capillary 204 (H) 70 - 99 mg/dL    Comment: Glucose reference range applies only to samples taken after fasting for at least 8 hours.  Basic metabolic panel      Status: Abnormal   Collection Time: 03/02/23  4:22 AM  Result Value  Ref Range   Sodium 134 (L) 135 - 145 mmol/L   Potassium 4.1 3.5 - 5.1 mmol/L   Chloride 102 98 - 111 mmol/L   CO2 23 22 - 32 mmol/L   Glucose, Bld 221 (H) 70 - 99 mg/dL    Comment: Glucose reference range applies only to samples taken after fasting for at least 8 hours.   BUN 22 8 - 23 mg/dL   Creatinine, Ser 4.09 (H) 0.61 - 1.24 mg/dL   Calcium 8.6 (L) 8.9 - 10.3 mg/dL   GFR, Estimated 56 (L) >60 mL/min    Comment: (NOTE) Calculated using the CKD-EPI Creatinine Equation (2021)    Anion gap 9 5 - 15    Comment: Performed at [facility], [address], [place], [place] [address]  CBC     Status: None   Collection Time: 03/02/23  4:22 AM  Result Value Ref Range   WBC 5.8 4.0 - 10.5 K/uL   RBC 4.50 4.22 - 5.81 MIL/uL   Hemoglobin 13.2 13.0 - 17.0 g/dL   HCT 47.8 29.5 - 62.1 %   MCV 86.9 80.0 - 100.0 fL   MCH 29.3 26.0 - 34.0 pg   MCHC 33.8 30.0 - 36.0 g/dL   RDW 30.8 65.7 - 84.6 %   Platelets 180 150 - 400 K/uL   nRBC 0.0 0.0 - 0.2 %    Comment: Performed at [facility], [address] [place], [place] [address]   MR FOOT RIGHT W WO CONTRAST Result Date: 03/01/2023 CLINICAL DATA:  Nonhealing foot wound EXAM: MRI OF THE RIGHT FOOT WITHOUT AND WITH CONTRAST TECHNIQUE: Multiplanar, multisequence MR imaging of the right foot was performed before and after the administration of intravenous contrast. CONTRAST:  9mL GADAVIST GADOBUTROL 1 MMOL/ML IV SOLN COMPARISON:  X-ray 03/01/2023, MRI 12/05/2018 FINDINGS: Bones/Joint/Cartilage Postsurgical changes from transmetatarsal amputation of the first through fifth rays. Erosive changes along the distal and plantar aspects of the first metatarsal resection margin with marrow edema and low T1 marrow signal compatible with acute osteomyelitis. Small amount of fluid is seen tracking within the medullary space of the first metatarsal. Patchy bone marrow edema within the medial  cuneiform, navicular, and fourth metatarsal base without fracture or confluent low T1 marrow signal changes likely reflecting stress related changes. No evidence of acute fracture or dislocation. Ligaments Intact Lisfranc ligament. Muscles and Tendons Amputation in denervation changes of the musculotendinous structures. No intramuscular fluid collection. Soft tissues Soft tissue ulceration at the plantar aspect of the forefoot underlying the distal first metatarsal. Surrounding edema. No organized fluid collection. IMPRESSION: 1. Appearance most compatible with acute osteomyelitis of the first metatarsal resection margin. 2. Soft tissue ulceration at the plantar aspect of the forefoot underlying the distal first metatarsal. 3. Patchy bone marrow edema within the medial cuneiform, navicular, and fourth metatarsal base likely reflecting stress-related changes. Electronically Signed   By: Duanne Guess D.O.   On: 03/01/2023 15:24   DG Foot Complete Right Result Date: 03/01/2023 CLINICAL DATA:  Status post amputation of the right forefoot. EXAM: RIGHT FOOT COMPLETE - 3+ VIEW COMPARISON:  Right foot radiograph dated 02/06/2022. FINDINGS: Status post transmetatarsal amputation. There is no acute fracture or dislocation. Apparent area of cortical irregularity of the plantar aspect of the metatarsal on the lateral view may be chronic. Osteomyelitis is not excluded clinical correlation is recommended. There is diffuse thickening of the soft tissues of the stump. There is a 2 cm ulceration of the skin over the plantar  stump. IMPRESSION: 1. Status post transmetatarsal amputation. 2. Ulceration of the skin over the plantar stone with diffuse soft tissue thickening and possible osteomyelitis. Clinical correlation is recommended. MRI or a white blood cell nuclear scan may provide better evaluation. Electronically Signed   By: Elgie Collard M.D.   On: 03/01/2023 11:13   DG Chest Port 1 View Result Date:  03/01/2023 CLINICAL DATA:  Elevated blood sugar.  Fever. EXAM: PORTABLE CHEST 1 VIEW COMPARISON:  Chest radiograph dated May 17, 2022. FINDINGS: The heart size and mediastinal contours are within normal limits. No focal consolidation, sizeable pleural effusion, or pneumothorax. No acute osseous abnormality. IMPRESSION: No acute cardiopulmonary findings. Electronically Signed   By: Hart Robinsons M.D.   On: 03/01/2023 08:33    Pending Labs Unresulted Labs (From admission, onward)    None       Vitals/Pain Today's Vitals   03/02/23 0500 03/02/23 0600 03/02/23 0700 03/02/23 0706  BP: (!) 126/51 131/62 120/60   Pulse: 86 83 79   Resp: 17 17 18    Temp:  98.5 F (36.9 C)    TempSrc:      SpO2: 96% 93% 93%   Weight:      Height:      PainSc:    Asleep    Isolation Precautions No active isolations  Medications Medications  piperacillin-tazobactam (ZOSYN) IVPB 3.375 g (0 g Intravenous Stopped 03/01/23 1925)    Followed by  piperacillin-tazobactam (ZOSYN) IVPB 3.375 g (0 g Intravenous Stopped 03/02/23 0410)  enoxaparin (LOVENOX) injection 40 mg (40 mg Subcutaneous Given 03/01/23 1746)  acetaminophen (TYLENOL) tablet 650 mg (has no administration in time range)    Or  acetaminophen (TYLENOL) suppository 650 mg (has no administration in time range)  senna-docusate (Senokot-S) tablet 1 tablet (has no administration in time range)  albuterol (PROVENTIL) (2.5 MG/3ML) 0.083% nebulizer solution 2.5 mg (has no administration in time range)  hydrALAZINE (APRESOLINE) injection 5 mg (has no administration in time range)  lactated ringers infusion (150 mL/hr Intravenous Rate/Dose Verify 03/02/23 0706)  insulin aspart (novoLOG) injection 0-15 Units (3 Units Subcutaneous Given 03/01/23 1725)  insulin aspart (novoLOG) injection 0-5 Units ( Subcutaneous Not Given 03/01/23 2134)  vancomycin (VANCOREADY) IVPB 2000 mg/400 mL (0 mg Intravenous Stopped 03/01/23 1946)  insulin glargine-yfgn (SEMGLEE)  injection 10 Units (has no administration in time range)  atorvastatin (LIPITOR) tablet 10 mg (10 mg Oral Given 03/01/23 2117)  B-complex with vitamin C tablet 1 tablet (has no administration in time range)  buPROPion (WELLBUTRIN XL) 24 hr tablet 150 mg (150 mg Oral Given 03/01/23 2117)  cholecalciferol (VITAMIN D3) 25 MCG (1000 UNIT) tablet 2,000 Units (has no administration in time range)  ferrous sulfate tablet 325 mg (has no administration in time range)  pantoprazole (PROTONIX) EC tablet 40 mg (has no administration in time range)  sertraline (ZOLOFT) tablet 100 mg (100 mg Oral Given 03/01/23 2117)  sodium bicarbonate tablet 650 mg (650 mg Oral Given 03/01/23 2117)  febuxostat (ULORIC) tablet 80 mg (has no administration in time range)  metoprolol succinate (TOPROL-XL) 24 hr tablet 100 mg (has no administration in time range)  hydrALAZINE (APRESOLINE) tablet 25 mg (25 mg Oral Given 03/01/23 1832)  sodium chloride 0.9 % bolus 500 mL (0 mLs Intravenous Stopped 03/01/23 0850)  insulin aspart (novoLOG) injection 10 Units (10 Units Subcutaneous Given 03/01/23 1100)  gadobutrol (GADAVIST) 1 MMOL/ML injection 9 mL (9 mLs Intravenous Contrast Given 03/01/23 1246)  metoprolol succinate (TOPROL-XL) 24 hr tablet 50 mg (  50 mg Oral Given 03/01/23 1832)  insulin aspart (novoLOG) injection 15 Units (15 Units Subcutaneous Given 03/01/23 2139)    Mobility manual wheelchair; He has osteomyelitis in right foot     Focused Assessments Wound to the bottom of right foot, digits missing   R Recommendations: See Admitting Provider Note  Report given to:   Additional Notes: hard to understand speech, uses urinal, pleasant, A&O x 3, feeds self, has a legal guardian

## 2023-03-03 DIAGNOSIS — M8610 Other acute osteomyelitis, unspecified site: Secondary | ICD-10-CM | POA: Diagnosis not present

## 2023-03-03 DIAGNOSIS — I10 Essential (primary) hypertension: Secondary | ICD-10-CM | POA: Diagnosis not present

## 2023-03-03 DIAGNOSIS — N1831 Chronic kidney disease, stage 3a: Secondary | ICD-10-CM | POA: Diagnosis not present

## 2023-03-03 DIAGNOSIS — K219 Gastro-esophageal reflux disease without esophagitis: Secondary | ICD-10-CM | POA: Diagnosis not present

## 2023-03-03 LAB — CREATININE, SERUM
Creatinine, Ser: 1.6 mg/dL — ABNORMAL HIGH (ref 0.61–1.24)
GFR, Estimated: 46 mL/min — ABNORMAL LOW (ref >60)

## 2023-03-03 LAB — GLUCOSE, CAPILLARY
Glucose-Capillary: 226 mg/dL — ABNORMAL HIGH (ref 70–99)
Glucose-Capillary: 324 mg/dL — ABNORMAL HIGH (ref 70–99)
Glucose-Capillary: 168 mg/dL — ABNORMAL HIGH (ref 70–99)
Glucose-Capillary: 120 mg/dL — ABNORMAL HIGH (ref 70–99)

## 2023-03-03 MED ORDER — INSULIN GLARGINE-YFGN 100 UNIT/ML ~~LOC~~ SOLN
15.0000 [IU] | Freq: Every day | SUBCUTANEOUS | Status: DC
Start: 2023-03-04 — End: 2023-03-04
  Administered 2023-03-04: 15 [IU] via SUBCUTANEOUS
  Filled 2023-03-03 (×2): qty 0.15

## 2023-03-03 MED ORDER — INSULIN GLARGINE-YFGN 100 UNIT/ML ~~LOC~~ SOLN
12.0000 [IU] | Freq: Every day | SUBCUTANEOUS | Status: DC
  Administered 2023-03-03: 12 [IU] via SUBCUTANEOUS
  Filled 2023-03-03 (×2): qty 0.12

## 2023-03-03 MED ORDER — INSULIN ASPART 100 UNIT/ML IJ SOLN
4.0000 [IU] | Freq: Three times a day (TID) | INTRAMUSCULAR | Status: DC
  Administered 2023-03-03 – 2023-03-04 (×4): 4 [IU] via SUBCUTANEOUS

## 2023-03-03 NOTE — TOC Progression Note (Signed)
Transition of Care [facility]) - Progression Note    Patient Details  Name: [name] MRN: [id number] Date of Birth: 02-03-1951  Transition of [facility]) CM/SW Contact  Elliot Gault, LCSW Phone Number: 03/03/2023, 12:05 PM  Clinical Narrative:     TOC following. Spoke with pt at bedside to review dc planning. MD anticipating dc tomorrow. Discussed HH recommendation with pt who declined HH referral due to his dog not being accepting of other people in the home. Pt states that he feels he can manage without the [facility]: [facility] services. Pt aware that if he changes his mind before dc, TOC can arrange for him.  Will follow.  Expected Discharge Plan: Home w Home Health Services Barriers to Discharge: Continued Medical Work up  Expected Discharge Plan and Services In-house Referral: Clinical Social Work   Post Acute Care Choice: Home Health Living arrangements for the past 2 months: Single Family Home                           HH Arranged: Patient Refused [facility]           Social Determinants of Health (SDOH) Interventions SDOH Screenings   Food Insecurity: No Food Insecurity (03/01/2023)  Housing: Low Risk  (03/01/2023)  Transportation Needs: No Transportation Needs (03/01/2023)  Utilities: Not At Risk (03/01/2023)  Financial Resource Strain: Low Risk  (12/13/2021)  Social Connections: Socially Isolated (03/01/2023)  Tobacco Use: Low Risk  (03/02/2023)    Readmission Risk Interventions    11/30/2021    2:51 PM  Readmission Risk Prevention Plan  Transportation Screening Complete  HRI or Home Care Consult Complete  Social Work Consult for Recovery Care Planning/Counseling Complete  Palliative Care Screening Not Applicable  Medication Review Oceanographer) Complete

## 2023-03-03 NOTE — Progress Notes (Signed)
Progress Note   Patient: [name] ZOX:[id number] DOB: 07-02-1950 DOA: 03/01/2023     2 DOS: the patient was seen and examined on 03/03/2023   Brief hospital admission course: As per H&P written by Dr. Randol Kern on 03/01/2023 Yen Morris  is a 73 y.o. male,significant of cognitive impairment, anxiety, diabetes mellitus type 2, GERD, hyperlipidemia, hypertension,  renal insufficiency, history of right great toe osteomyelitis status post amputation by Dr. Lajoyce Corners in 2021, followed by transmetatarsal amputation of left great toe by Dr. Lovell Sheehan in 2023, and history of abscess, osteomyelitis of right foot status post right TMA and wound VAC application by Dr. Lajoyce Corners in December 2023. -She presents to ED today secondary to multiple complaints, including hyperglycemia, fever, patient lives home by himself, he is with some communicative impairment, but has been living by himself and managing for years, patient comes to ED with complaints of chronic leg pain, as well as concerns of high blood sugar readings at home, overall he is poor historian, [place] nausea, vomiting, diarrhea, or any pain in the legs, patient reports he is compliant with his medications.. -In ED he was noted to be febrile of 99, tachycardic at 113, tachypneic at 27, lactic acid elevated at 2.3, trending up despite receiving fluid bolus, repeat was 2.4, white blood cell count within normal limit, glucose is elevated at 401, sodium is low at 129 due to pseudohyponatremia, bicarb within normal limit at 23, anion gap within normal limit at 9, MRI confirms right right first metatarsal resection margin acute osteomyelitis, ED discussed with orthopedic Dr. Romeo Apple who will evaluate the patient in a.m., blood cultures were sent, patient was started on empiric antibiotic coverage and [facility] hospitalist consulted to admit.  Assessment and Plan: 1-sepsis due to acute osteomyelitis of right first metatarsal resection margin -Patient met criteria for  sepsis at time of admission with tachycardia, fever, identify source of infection elevated lactic acid -Fluid resuscitation provided -At time of examination today patient without fever and hemodynamically stable. -Continue IV antibiotics -After discussing with orthopedic service plan is for antibiotic therapy and once stable discharge home with oral antibiotics and outpatient follow-up with Dr. Lajoyce Corners. -Continue as needed analgesics and supportive care.  2-type 2 diabetes mellitus uncontrolled with hyperglycemia -Continue sliding scale insulin and follow CBG fluctuation -Long-acting 15 units has also been started and we will plan to further adjust based on blood sugar behavior. -Modified carbohydrate diet discussed with patient.  3-chronic kidney disease stage IIIa -Based on chart review and patient's GFR she qualifies for stage IIIa at baseline -Continue to follow renal function trend -Minimize nephrotoxic agents -Continue avoiding hypotension and the use of contrast. -Patient advised to maintain adequate hydration.  4-hypertension -Continue current antihypertensive agents -Heart healthy diet discussed with patient.  5-nausea -Patient reported intermittent nausea on presentation -Continue as needed antiemetics -Diet has been advanced as tolerated -Continue to follow clinical response.  6-GERD -Continue PPI  7-history of gout -No acute flare currently appreciated -Continue treatment with Uloric  8-hyperlipidemia -Continue statin  9-history of depression/anxiety -Continue treatment with sertraline -No suicidal ideation or hallucinations appreciated.  10-pseudohyponatremia -In the setting of hyperglycemia time of admission -Corrected after stabilizing patient's blood sugar levels.   Subjective:  Afebrile, no chest pain, no nausea vomiting.  Reports feeling better and currently is present no further episode of nausea vomiting.  Tolerating diet okay.  Physical  Exam: Vitals:   03/02/23 1642 03/02/23 2029 03/03/23 0510 03/03/23 1454  BP: 122/64 123/61 (!) 119/51 (!) 110/48  Pulse: 75 85 83 72  Resp: 16 16 18 18   Temp: 98.6 F (37 C) 99.1 F (37.3 C) 98 F (36.7 C) 97.7 F (36.5 C)  TempSrc: Oral Oral Oral Oral  SpO2: 95% 97% 94% 95%  Weight:      Height:       General exam: Alert, awake, following commands appropriately.  In no acute distress.  Patient demonstrating no fever, no chest pain, no nausea, no vomiting. Respiratory system: Good saturation on room air. Cardiovascular system:RRR. No rubs or gallops.. Gastrointestinal system: Abdomen is nondistended, soft and nontender. No organomegaly or masses felt. Normal bowel sounds heard. Central nervous system: No focal neurological deficits. Extremities/skin: No cyanosis or clubbing.  Right foot status post metatarsal amputation with wound in his right metatarsal resection margin.  No significant drainage on today's exam.  No petechiae. Psychiatry: Mood & affect appropriate.   Latest data Reviewed: Basic metabolic panel: Sodium 134, potassium 4.1, chloride 102, bicarb 23, glucose 221, BUN 22, creatinine 1.36 and GFR 56 CBC: WBCs 5.8, hemoglobin 13.2 platelet count 180K  Family Communication: No family at bedside.  Disposition: Status is: Inpatient Remains inpatient appropriate because: Continue IV antibiotics.   Planned Discharge Destination: Home  Time spent: 50 minutes  Author: Vassie Loll, MD 03/03/2023 5:36 PM  For on call review [email or web address].

## 2023-03-03 NOTE — Care Management Important Message (Signed)
Important Message  Patient Details  Name: [name] MRN: [id number] Date of Birth: March 14, 1950   Important Message Given:  Yes - Medicare IM     Corey Harold 03/03/2023, 1:41 PM

## 2023-03-03 NOTE — Plan of Care (Signed)
  Problem: Education: Goal: Knowledge of General Education information will improve Description Including pain rating scale, medication(s)/side effects and non-pharmacologic comfort measures Outcome: Progressing   Problem: Health Behavior/Discharge Planning: Goal: Ability to manage health-related needs will improve Outcome: Progressing   

## 2023-03-03 NOTE — Inpatient Diabetes Management (Signed)
Inpatient Diabetes Program Recommendations  AACE/ADA: New Consensus Statement on Inpatient Glycemic Control   Target Ranges:  Prepandial:   less than 140 mg/dL      Peak postprandial:   less than 180 mg/dL (1-2 hours)      Critically ill patients:  140 - 180 mg/dL    Latest Reference Range & Units 03/02/23 04:16 03/02/23 09:34 03/02/23 11:41 03/02/23 16:41 03/02/23 20:33  Glucose-Capillary 70 - 99 mg/dL 213 (H) 086 (H) 578 (H) 193 (H) 231 (H)   Review of Glycemic Control   Diabetes history: DM2 Outpatient Diabetes medications: Toujeo 20 units BID, Glipizide 5 mg BID, Actos 30 mg daily (not taking) Current orders for Inpatient glycemic control: Semglee 10 units daily, Novolog 0-15 units TID with meals, Novolog 0-5 units QHS  Inpatient Diabetes Program Recommendations:    Insulin: Please consider ordering Novolog 4 units TID with meals for meal coverage if patient eats at least 50% of meals.  Thanks, Orlando Penner, RN, MSN, CDCES Diabetes Coordinator Inpatient Diabetes Program [phone] (Team Pager from 8am to 5pm)

## 2023-03-04 DIAGNOSIS — M8610 Other acute osteomyelitis, unspecified site: Secondary | ICD-10-CM | POA: Diagnosis not present

## 2023-03-04 DIAGNOSIS — M86171 Other acute osteomyelitis, right ankle and foot: Secondary | ICD-10-CM

## 2023-03-04 DIAGNOSIS — R739 Hyperglycemia, unspecified: Secondary | ICD-10-CM

## 2023-03-04 DIAGNOSIS — K219 Gastro-esophageal reflux disease without esophagitis: Secondary | ICD-10-CM | POA: Diagnosis not present

## 2023-03-04 DIAGNOSIS — E872 Acidosis, unspecified: Secondary | ICD-10-CM | POA: Diagnosis not present

## 2023-03-04 LAB — BASIC METABOLIC PANEL
Anion gap: 9 (ref 5–15)
BUN: 23 mg/dL (ref 8–23)
CO2: 21 mmol/L — ABNORMAL LOW (ref 22–32)
Calcium: 8.5 mg/dL — ABNORMAL LOW (ref 8.9–10.3)
Chloride: 105 mmol/L (ref 98–111)
Creatinine, Ser: 1.66 mg/dL — ABNORMAL HIGH (ref 0.61–1.24)
GFR, Estimated: 44 mL/min — ABNORMAL LOW (ref >60)
Glucose, Bld: 130 mg/dL — ABNORMAL HIGH (ref 70–99)
Potassium: 3.7 mmol/L (ref 3.5–5.1)
Sodium: 135 mmol/L (ref 135–145)

## 2023-03-04 LAB — GLUCOSE, CAPILLARY
Glucose-Capillary: 167 mg/dL — ABNORMAL HIGH (ref 70–99)
Glucose-Capillary: 265 mg/dL — ABNORMAL HIGH (ref 70–99)

## 2023-03-04 MED ORDER — DOXYCYCLINE HYCLATE 100 MG PO TABS: 100.0000 mg | ORAL_TABLET | Freq: Two times a day (BID) | ORAL | 0 refills | Status: AC

## 2023-03-04 MED ORDER — TOUJEO MAX SOLOSTAR 300 UNIT/ML ~~LOC~~ SOPN: 20.0000 [IU] | PEN_INJECTOR | Freq: Every day | SUBCUTANEOUS | Status: DC

## 2023-03-04 MED ORDER — SACCHAROMYCES BOULARDII 250 MG PO CAPS: 250.0000 mg | ORAL_CAPSULE | Freq: Two times a day (BID) | ORAL | 0 refills | Status: DC

## 2023-03-04 MED ORDER — VANCOMYCIN HCL 1750 MG/350ML IV SOLN
1750.0000 mg | INTRAVENOUS | Status: DC
  Filled 2023-03-04: qty 350

## 2023-03-04 MED ORDER — CEPHALEXIN 500 MG PO CAPS: 500.0000 mg | ORAL_CAPSULE | Freq: Two times a day (BID) | ORAL | 0 refills | Status: AC

## 2023-03-04 NOTE — Discharge Summary (Signed)
Physician Discharge Summary   Patient: [name] MRN: [id number] DOB: [birth year]  Admit date:     03/01/2023  Discharge date: 03/04/23  Discharge Physician: [name]   PCP: Marvin Found, MD   Recommendations at discharge:  Repeat basic metabolic panel to follow ultralights renal function Continue close monitoring to patient's CBGs with further adjustment to hypoglycemic regimen as needed Make sure patient follow-up as instructed with orthopedic service ([name]) for definitive treatment of ongoing right foot osteomyelitis.  Discharge Diagnoses: Principal Problem:   Acute osteomyelitis of ankle or foot, right (HCC) Active Problems:   Hypertension   Gastroesophageal reflux disease   DM (diabetes mellitus) (HCC)   Amputated great toe of right foot (HCC)   Chronic kidney disease   Stage 3a chronic kidney disease (HCC)   Hyperlipidemia   History of transmetatarsal amputation of right foot (HCC)   Lactic acidosis   Hyperglycemia   Brief Hospital Admission Course: As per H&P written by Dr. Randol Grant on 03/01/2023 [name]  is a 73 y.o. male,significant of cognitive impairment, anxiety, diabetes mellitus type 2, GERD, hyperlipidemia, hypertension,  renal insufficiency, history of right great toe osteomyelitis status post amputation by [name] in 2021, followed by transmetatarsal amputation of left great toe by Dr. Lovell Sheehan in 2023, and history of abscess, osteomyelitis of right foot status post right TMA and wound VAC application by [name] in December 2023. -She presents to ED today secondary to multiple complaints, including hyperglycemia, fever, patient lives home by himself, he is with some communicative impairment, but has been living by himself and managing for years, patient comes to ED with complaints of chronic leg pain, as well as concerns of high blood sugar readings at home, overall he is poor historian, [place] nausea, vomiting, diarrhea, or any pain in the legs,  patient reports he is compliant with his medications.. -In ED he was noted to be febrile of 99, tachycardic at 113, tachypneic at 27, lactic acid elevated at 2.3, trending up despite receiving fluid bolus, repeat was 2.4, white blood cell count within normal limit, glucose is elevated at 401, sodium is low at 129 due to pseudohyponatremia, bicarb within normal limit at 23, anion gap within normal limit at 9, MRI confirms right right first metatarsal resection margin acute osteomyelitis, ED discussed with orthopedic Dr. Romeo Grant who will evaluate the patient in a.m., blood cultures were sent, patient was started on empiric antibiotic coverage and [facility] hospitalist consulted to admit.  Assessment and Plan: 1-sepsis due to acute osteomyelitis of right first metatarsal resection margin -Patient met criteria for sepsis at time of admission with tachycardia, fever, identify source of infection elevated lactic acid -Fluid resuscitation provided -At time of examination today patient without fever and hemodynamically stable. -Patient completed in-house IV antibiotic therapy and at discharge he was afebrile for 48 hours and with resolution of acute sepsis features present at time of admission. -After discussing with orthopedic service plan is for antibiotic therapy and once stable discharge home with oral antibiotics and outpatient follow-up with [name]. -Continue as needed analgesics and supportive care.   2-type 2 diabetes mellitus uncontrolled with hyperglycemia -Patient advised to follow modified carbohydrate diet and to maintain adequate hydration -Resume home hypoglycemic regimen -Recommending close monitoring of CBGs fluctuation with further adjustment to insulin therapy as required.   3-chronic kidney disease stage IIIa -Based on chart review and patient's GFR she qualifies for stage IIIa at baseline -Continue to follow renal function trend/stability. -Continue minimizing the use  of nephrotoxic  agents -Continue avoiding hypotension and the use of contrast. -Patient advised to maintain adequate hydration.   4-hypertension -Continue current antihypertensive agents -Heart healthy diet discussed with patient.   5-nausea -Patient reported intermittent nausea on presentation -Resolved and no longer present at discharge -As mentioned below will continue treatment with PPI.   6-GERD -Continue PPI -Lifestyle changes discussed with patient.   7-history of gout -No acute flare currently appreciated -Continue treatment with Uloric   8-hyperlipidemia -Continue statin -Heart healthy diet discussed with patient   9-history of depression/anxiety -Continue treatment with sertraline -No suicidal ideation or hallucinations appreciated.   10-pseudohyponatremia -In the setting of hyperglycemia time of admission -Corrected after stabilizing patient's blood sugar levels. -Continue to follow electrolytes trend/stability with repeat basic metabolic panel follow-up visit.   Consultants: Orthopedic service. Procedures performed: See below for x-ray reports. Disposition: Home Diet recommendation: Heart healthy/modified carbohydrate diet.  DISCHARGE MEDICATION: Allergies as of 03/04/2023       Reactions   Ivp Dye [iodinated Contrast Media] Other (See Comments)   Sped heart rate up    Tetanus Toxoids Rash        Medication List     TAKE these medications    Accu-Chek Guide test strip Generic drug: glucose blood USE AS DIRECTED TO TEST TWICE DAILY   acetaminophen 500 MG tablet Commonly known as: TYLENOL Take 500-1,000 mg by mouth every 6 (six) hours as needed for mild pain or headache. Pain   atorvastatin 10 MG tablet Commonly known as: LIPITOR Take 10 mg by mouth at bedtime.   B-complex with vitamin C tablet Take 1 tablet by mouth daily.   buPROPion 150 MG 24 hr tablet Commonly known as: WELLBUTRIN XL Take 150 mg by mouth at bedtime.   cephALEXin 500 MG  capsule Commonly known as: KEFLEX Take 1 capsule (500 mg total) by mouth 2 (two) times daily for 21 days.   cetirizine 10 MG tablet Commonly known as: ZYRTEC Take 10 mg by mouth daily as needed for allergies.   doxycycline 100 MG tablet Commonly known as: VIBRA-TABS Take 1 tablet (100 mg total) by mouth 2 (two) times daily.   ferrous sulfate 325 (65 FE) MG EC tablet Take 325 mg by mouth daily with breakfast.   Fish Oil 1000 MG Caps Take 1,000 mg by mouth 2 (two) times daily. Afternoon & bedtime.   glipiZIDE 5 MG tablet Commonly known as: GLUCOTROL Take 5 mg by mouth 2 (two) times daily.   hydrALAZINE 25 MG tablet Commonly known as: APRESOLINE TAKE 1 TABLET BY MOUTH IN THE MORNING AND AT BEDTIME. MAY TAKE ADDITIONAL AS NEEDED FOR SYSTOLIC BLOOD PRESSURE CONSISTANTLY ABOVE 140   metoprolol succinate 100 MG 24 hr tablet Commonly known as: TOPROL-XL TAKE 1 TABLET BY MOUTH DAILY. TAKE WITH OR IMMEDIATELY FOLLOWING A MEAL   omeprazole 20 MG capsule Commonly known as: PRILOSEC TAKE ONE CAPSULE BY MOUTH DAILY. NEEDS OFFICE VISIT FOR FURTHER REFILLS.   pioglitazone 30 MG tablet Commonly known as: ACTOS Take 30 mg by mouth daily.   saccharomyces boulardii 250 MG capsule Commonly known as: Florastor Take 1 capsule (250 mg total) by mouth 2 (two) times daily.   sertraline 100 MG tablet Commonly known as: ZOLOFT Take 100 mg by mouth at bedtime.   sodium bicarbonate 650 MG tablet Take 650 mg by mouth 2 (two) times daily.   Toujeo Max SoloStar 300 UNIT/ML Solostar Pen Generic drug: insulin glargine (2 Unit Dial) Inject 20 Units into the  skin daily.   Uloric 80 MG Tabs Generic drug: Febuxostat Take 80 mg by mouth daily.   Vitamin D 50 MCG (2000 UT) Caps Take 2,000 Units by mouth daily.        Follow-up Information     Marvin Found, MD. Schedule an appointment as soon as possible for a visit in 2 week(s).   Specialty: Family Medicine Contact information: [address] [place] [address] [phone]         Nadara Mustard, MD Follow up in 10 day(s).   Specialty: Orthopedic Surgery Contact information: [address] [place] [address] [phone]                Discharge Exam: Ceasar Mons Weights   03/01/23 0734  Weight: 99.8 kg   General exam: Alert, awake, following commands appropriately.  In no acute distress.  Patient demonstrating no fever, no chest pain, no nausea, no vomiting. Respiratory system: Good saturation on room air. Cardiovascular system:RRR. No rubs or gallops.. Gastrointestinal system: Abdomen is nondistended, soft and nontender. No organomegaly or masses felt. Normal bowel sounds heard. Central nervous system: No focal neurological deficits. Extremities/skin: No cyanosis or clubbing.  Right foot status post metatarsal amputation with wound in his right metatarsal resection margin.  No significant drainage on today's exam.  No petechiae. Psychiatry: Mood & affect appropriate.   Condition at discharge: Stable and improved.  The results of significant diagnostics from this hospitalization (including imaging, microbiology, ancillary and laboratory) are listed below for reference.   Imaging Studies: MR FOOT RIGHT W WO CONTRAST Result Date: 03/01/2023 CLINICAL DATA:  Nonhealing foot wound EXAM: MRI OF THE RIGHT FOOT WITHOUT AND WITH CONTRAST TECHNIQUE: Multiplanar, multisequence MR imaging of the right foot was performed before and after the administration of intravenous contrast. CONTRAST:  9mL GADAVIST GADOBUTROL 1 MMOL/ML IV SOLN COMPARISON:  X-ray 03/01/2023, MRI 12/05/2018 FINDINGS: Bones/Joint/Cartilage Postsurgical changes from transmetatarsal amputation of the first through fifth rays. Erosive changes along the distal and plantar aspects of the first metatarsal resection margin with marrow edema and low T1 marrow signal compatible with acute osteomyelitis. Small amount of fluid is seen tracking  within the medullary space of the first metatarsal. Patchy bone marrow edema within the medial cuneiform, navicular, and fourth metatarsal base without fracture or confluent low T1 marrow signal changes likely reflecting stress related changes. No evidence of acute fracture or dislocation. Ligaments Intact Lisfranc ligament. Muscles and Tendons Amputation in denervation changes of the musculotendinous structures. No intramuscular fluid collection. Soft tissues Soft tissue ulceration at the plantar aspect of the forefoot underlying the distal first metatarsal. Surrounding edema. No organized fluid collection. IMPRESSION: 1. Appearance most compatible with acute osteomyelitis of the first metatarsal resection margin. 2. Soft tissue ulceration at the plantar aspect of the forefoot underlying the distal first metatarsal. 3. Patchy bone marrow edema within the medial cuneiform, navicular, and fourth metatarsal base likely reflecting stress-related changes. Electronically Signed   By: Duanne Guess D.O.   On: 03/01/2023 15:24   DG Foot Complete Right Result Date: 03/01/2023 CLINICAL DATA:  Status post amputation of the right forefoot. EXAM: RIGHT FOOT COMPLETE - 3+ VIEW COMPARISON:  Right foot radiograph dated 02/06/2022. FINDINGS: Status post transmetatarsal amputation. There is no acute fracture or dislocation. Apparent area of cortical irregularity of the plantar aspect of the metatarsal on the lateral view may be chronic. Osteomyelitis is not excluded clinical correlation is recommended. There is diffuse thickening of the soft tissues of the stump. There  is a 2 cm ulceration of the skin over the plantar stump. IMPRESSION: 1. Status post transmetatarsal amputation. 2. Ulceration of the skin over the plantar stone with diffuse soft tissue thickening and possible osteomyelitis. Clinical correlation is recommended. MRI or a white blood cell nuclear scan may provide better evaluation. Electronically Signed   By:  Elgie Collard M.D.   On: 03/01/2023 11:13   DG Chest Port 1 View Result Date: 03/01/2023 CLINICAL DATA:  Elevated blood sugar.  Fever. EXAM: PORTABLE CHEST 1 VIEW COMPARISON:  Chest radiograph dated May 17, 2022. FINDINGS: The heart size and mediastinal contours are within normal limits. No focal consolidation, sizeable pleural effusion, or pneumothorax. No acute osseous abnormality. IMPRESSION: No acute cardiopulmonary findings. Electronically Signed   By: Hart Robinsons M.D.   On: 03/01/2023 08:33    Microbiology: Results for orders placed or performed during the hospital encounter of 03/01/23  Culture, blood (Routine X 2) w Reflex to ID Panel     Status: None (Preliminary result)   Collection Time: 03/01/23  8:03 AM   Specimen: BLOOD  Result Value Ref Range Status   Specimen Description BLOOD BLOOD LEFT HAND  Final   Special Requests   Final    BOTTLES DRAWN AEROBIC ONLY Blood Culture adequate volume   Culture   Final    NO GROWTH 3 DAYS Performed at [facility], [address], [place], [place] [address]    Report Status PENDING  Incomplete  Culture, blood (Routine X 2) w Reflex to ID Panel     Status: None (Preliminary result)   Collection Time: 03/01/23  8:03 AM   Specimen: BLOOD  Result Value Ref Range Status   Specimen Description BLOOD BLOOD RIGHT WRIST  Final   Special Requests   Final    BOTTLES DRAWN AEROBIC AND ANAEROBIC Blood Culture adequate volume   Culture   Final    NO GROWTH 3 DAYS Performed at [facility], [address] [place], [place] [address]    Report Status PENDING  Incomplete    Labs: CBC: Recent Labs  Lab 03/01/23 0803 03/02/23 0422  WBC 6.7 5.8  HGB 14.4 13.2  HCT 41.6 39.1  MCV 85.1 86.9  PLT 195 180   Basic Metabolic Panel: Recent Labs  Lab 03/01/23 0803 03/02/23 0422 03/03/23 0519 03/04/23 0430  NA 129* 134*  --  135  K 4.2 4.1  --  3.7  CL 97* 102  --  105  CO2 23 23  --  21*  GLUCOSE 401* 221*  --  130*  BUN 28* 22   --  23  CREATININE 1.38* 1.36* 1.60* 1.66*  CALCIUM 9.0 8.6*  --  8.5*   Liver Function Tests: Recent Labs  Lab 03/01/23 0803  AST 18  ALT 17  ALKPHOS 79  BILITOT 0.9  PROT 7.1  ALBUMIN 3.6   CBG: Recent Labs  Lab 03/03/23 1117 03/03/23 1648 03/03/23 2043 03/04/23 0721 03/04/23 1134  GLUCAP 324* 168* 120* 167* 265*    Discharge time spent: greater than 30 minutes.  Signed: Vassie Loll, MD [facility] 03/04/2023

## 2023-03-04 NOTE — Consult Note (Signed)
Pharmacy Antibiotic Note  [name] is a 73 y.o. male with history including diabetes and prior amputations admitted on 03/01/2023 with  right foot ulceration / DFI . Orthopaedic surgery consulted. Pharmacy has been consulted for vancomycin and Zosyn dosing.  Patient currently afebrile, wbc normal. Scr up slightly from admit at 1.66. No growth in cultures.   Plan: Continue current antibiotics  Zosyn 3.375 g IV q8h (4-hr infusion) Reduce Vancomycin to 1750 mg IV q24h --Calculated AUC: 516, scr 1.66 --Levels at steady state or as clinically indicated  Height: 6\' 5"  (195.6 cm) Weight: 99.8 kg (220 lb) IBW/kg (Calculated) : 89.1  Temp (24hrs), Avg:98 F (36.7 C), Min:97.7 F (36.5 C), Max:98.2 F (36.8 C)  Recent Labs  Lab 03/01/23 0803 03/01/23 0931 03/02/23 0422 03/03/23 0519 03/04/23 0430  WBC 6.7  --  5.8  --   --   CREATININE 1.38*  --  1.36* 1.60* 1.66*  LATICACIDVEN 2.3* 2.4*  --   --   --     Estimated Creatinine Clearance: 51.4 mL/min (A) (by C-G formula based on SCr of 1.66 mg/dL (H)).    Allergies  Allergen Reactions   Ivp Dye [Iodinated Contrast Media] Other (See Comments)    Sped heart rate up    Tetanus Toxoids Rash    Antimicrobials this admission: Zosyn 1/15 >>  Vancomycin 1/15 >>   Dose adjustments this admission: N/A  Microbiology results: 1/15 BCx: ngtd  Thank you for allowing pharmacy to be a part of this patient's care.  Sheppard Coil PharmD., BCPS Clinical Pharmacist 03/04/2023 12:19 PM

## 2023-03-04 NOTE — Plan of Care (Signed)

## 2023-03-06 LAB — CULTURE, BLOOD (ROUTINE X 2)
Culture: NO GROWTH
Culture: NO GROWTH
Special Requests: ADEQUATE
Special Requests: ADEQUATE

## 2023-03-08 DIAGNOSIS — I10 Essential (primary) hypertension: Secondary | ICD-10-CM | POA: Diagnosis not present

## 2023-03-08 DIAGNOSIS — E1165 Type 2 diabetes mellitus with hyperglycemia: Secondary | ICD-10-CM | POA: Diagnosis not present

## 2023-03-08 DIAGNOSIS — E109 Type 1 diabetes mellitus without complications: Secondary | ICD-10-CM | POA: Diagnosis not present

## 2023-03-08 DIAGNOSIS — M869 Osteomyelitis, unspecified: Secondary | ICD-10-CM | POA: Diagnosis not present

## 2023-03-08 DIAGNOSIS — D649 Anemia, unspecified: Secondary | ICD-10-CM | POA: Diagnosis not present

## 2023-03-08 DIAGNOSIS — N1831 Chronic kidney disease, stage 3a: Secondary | ICD-10-CM | POA: Diagnosis not present

## 2023-03-25 ENCOUNTER — Other Ambulatory Visit: Payer: Self-pay

## 2023-03-25 ENCOUNTER — Emergency Department (HOSPITAL_COMMUNITY)
Admission: EM | Admit: 2023-03-25 | Discharge: 2023-03-25 | Disposition: A | Discharge status: Home / Self Care | Payer: 59 | Attending: Emergency Medicine | Admitting: Emergency Medicine

## 2023-03-25 ENCOUNTER — Emergency Department (HOSPITAL_COMMUNITY): Payer: 59

## 2023-03-25 ENCOUNTER — Encounter (HOSPITAL_COMMUNITY): Payer: Self-pay

## 2023-03-25 DIAGNOSIS — R42 Dizziness and giddiness: Secondary | ICD-10-CM | POA: Diagnosis not present

## 2023-03-25 DIAGNOSIS — I509 Heart failure, unspecified: Secondary | ICD-10-CM | POA: Diagnosis not present

## 2023-03-25 DIAGNOSIS — R0989 Other specified symptoms and signs involving the circulatory and respiratory systems: Secondary | ICD-10-CM | POA: Diagnosis not present

## 2023-03-25 DIAGNOSIS — Z79899 Other long term (current) drug therapy: Secondary | ICD-10-CM | POA: Diagnosis not present

## 2023-03-25 DIAGNOSIS — R059 Cough, unspecified: Secondary | ICD-10-CM | POA: Diagnosis not present

## 2023-03-25 DIAGNOSIS — E1165 Type 2 diabetes mellitus with hyperglycemia: Secondary | ICD-10-CM | POA: Diagnosis not present

## 2023-03-25 DIAGNOSIS — Z7984 Long term (current) use of oral hypoglycemic drugs: Secondary | ICD-10-CM | POA: Diagnosis not present

## 2023-03-25 DIAGNOSIS — R509 Fever, unspecified: Secondary | ICD-10-CM | POA: Diagnosis not present

## 2023-03-25 DIAGNOSIS — I499 Cardiac arrhythmia, unspecified: Secondary | ICD-10-CM | POA: Diagnosis not present

## 2023-03-25 DIAGNOSIS — I110 Hypertensive heart disease with heart failure: Secondary | ICD-10-CM | POA: Diagnosis not present

## 2023-03-25 DIAGNOSIS — R6889 Other general symptoms and signs: Secondary | ICD-10-CM | POA: Diagnosis not present

## 2023-03-25 DIAGNOSIS — R404 Transient alteration of awareness: Secondary | ICD-10-CM | POA: Diagnosis not present

## 2023-03-25 DIAGNOSIS — Z743 Need for continuous supervision: Secondary | ICD-10-CM | POA: Diagnosis not present

## 2023-03-25 DIAGNOSIS — R0689 Other abnormalities of breathing: Secondary | ICD-10-CM | POA: Diagnosis not present

## 2023-03-25 LAB — RESP PANEL BY RT-PCR (RSV, FLU A&B, COVID)  RVPGX2
Influenza A by PCR: NEGATIVE
Influenza B by PCR: NEGATIVE
Resp Syncytial Virus by PCR: NEGATIVE
SARS Coronavirus 2 by RT PCR: NEGATIVE

## 2023-03-25 LAB — COMPREHENSIVE METABOLIC PANEL
ALT: 19 U/L (ref 0–44)
AST: 17 U/L (ref 15–41)
Albumin: 3.7 g/dL (ref 3.5–5.0)
Alkaline Phosphatase: 59 U/L (ref 38–126)
Anion gap: 9 (ref 5–15)
BUN: 25 mg/dL — ABNORMAL HIGH (ref 8–23)
CO2: 25 mmol/L (ref 22–32)
Calcium: 8.8 mg/dL — ABNORMAL LOW (ref 8.9–10.3)
Chloride: 103 mmol/L (ref 98–111)
Creatinine, Ser: 1.52 mg/dL — ABNORMAL HIGH (ref 0.61–1.24)
GFR, Estimated: 49 mL/min — ABNORMAL LOW (ref >60)
Glucose, Bld: 203 mg/dL — ABNORMAL HIGH (ref 70–99)
Potassium: 4.8 mmol/L (ref 3.5–5.1)
Sodium: 137 mmol/L (ref 135–145)
Total Bilirubin: 1 mg/dL (ref 0.0–1.2)
Total Protein: 7.3 g/dL (ref 6.5–8.1)

## 2023-03-25 LAB — CBC WITH DIFFERENTIAL/PLATELET
Abs Immature Granulocytes: 0.06 10*3/uL (ref 0.00–0.07)
Basophils Absolute: 0 10*3/uL (ref 0.0–0.1)
Basophils Relative: 0 %
Eosinophils Absolute: 0.1 10*3/uL (ref 0.0–0.5)
Eosinophils Relative: 1 %
HCT: 39.1 % (ref 39.0–52.0)
Hemoglobin: 12.8 g/dL — ABNORMAL LOW (ref 13.0–17.0)
Immature Granulocytes: 1 %
Lymphocytes Relative: 10 %
Lymphs Abs: 0.7 10*3/uL (ref 0.7–4.0)
MCH: 29.7 pg (ref 26.0–34.0)
MCHC: 32.7 g/dL (ref 30.0–36.0)
MCV: 90.7 fL (ref 80.0–100.0)
Monocytes Absolute: 0.5 10*3/uL (ref 0.1–1.0)
Monocytes Relative: 7 %
Neutro Abs: 5.6 10*3/uL (ref 1.7–7.7)
Neutrophils Relative %: 81 %
Platelets: 195 10*3/uL (ref 150–400)
RBC: 4.31 MIL/uL (ref 4.22–5.81)
RDW: 13.9 % (ref 11.5–15.5)
WBC: 7 10*3/uL (ref 4.0–10.5)
nRBC: 0 % (ref 0.0–0.2)

## 2023-03-25 LAB — MAGNESIUM: Magnesium: 2.1 mg/dL (ref 1.7–2.4)

## 2023-03-25 LAB — TROPONIN I (HIGH SENSITIVITY)
Troponin I (High Sensitivity): 4 ng/L (ref <18)
Troponin I (High Sensitivity): 4 ng/L (ref <18)

## 2023-03-25 LAB — CBG MONITORING, ED: Glucose-Capillary: 181 mg/dL — ABNORMAL HIGH (ref 70–99)

## 2023-03-25 MED ORDER — LACTATED RINGERS IV BOLUS
1000.0000 mL | Freq: Once | INTRAVENOUS | Status: AC
  Administered 2023-03-25: 1000 mL via INTRAVENOUS

## 2023-03-25 MED ORDER — ACETAMINOPHEN 500 MG PO TABS
1000.0000 mg | ORAL_TABLET | Freq: Once | ORAL | Status: AC
  Administered 2023-03-25: 1000 mg via ORAL
  Filled 2023-03-25: qty 2

## 2023-03-25 NOTE — ED Triage Notes (Signed)
 [facility]cems fro home. Cc of waking up feeling sweaty and dizzy.  Denies it  now . Just says he has a headache.

## 2023-03-25 NOTE — ED Notes (Signed)
 Pt complains of right hip pain stating that he heard something "pop" at home when he sat down. EDP notified.

## 2023-03-25 NOTE — Discharge Instructions (Addendum)
 Thank you for coming to [facility] Emergency Department. You were seen for an episode of sweating, dizziness, and one week of cough. We did an exam, labs, and imaging, and these showed no acute findings. Please stay well hydrated at home. It is possible that you have a viral illness given your cough. Please take over the counter remedies for this. You can take tylenol  for headache.  Please follow up with your primary care provider within 1 week.   Do not hesitate to return to the ED or call 911 if you experience: -Worsening symptoms -Shortness of breath -Chest pain -Lightheadedness, passing out -Fevers/chills -Anything else that concerns you

## 2023-03-25 NOTE — ED Provider Notes (Signed)
 [place] EMERGENCY DEPARTMENT AT [facility] Provider Note   CSN: [id number] Arrival date & time: 03/25/23  9367     History  Chief Complaint  Patient presents with   Dizziness    [name] is a 73 y.o. male with cognitive impairment, anxiety, diabetes mellitus type 2, GERD, hyperlipidemia, hypertension, renal insufficiency, history of right great toe osteomyelitis status post amputation by Dr. Harden in 2021, followed by transmetatarsal amputation of left great toe by Dr. Mavis in 2023, and history of abscess, osteomyelitis of right foot status post right TMA and wound VAC application by Dr. Harden in December 2023. Presents to ED today with Cc of waking up feeling sweaty and dizzy.  He states he had not felt like that before.  Those symptoms have gone away now but now he just has a mild headache.  He denies any symptoms except a cough for approximately 1 week that is nonproductive.  Denies shortness of breath, chest pain, nausea vomiting diarrhea constipation, urinary symptoms.  He has some mild pain in his right foot around an area of transmetatarsal amputation for which she was recently admitted. Per chart review patient was recently admitted from 03/01/2023 to 03/04/2023 for acute osteomyelitis of the right foot with SIRS and uncontrolled type 2 diabetes.  Discharged with oral antibiotics.  Past Medical History:  Diagnosis Date   Anemia    Anxiety    Arthritis    Colon polyps    Depression    Diabetes mellitus without complication (HCC)    DM (diabetes mellitus) (HCC) 01/30/2019   Fatty liver    GERD (gastroesophageal reflux disease)    Gout    High cholesterol    History of echocardiogram 09/14/2011   EF >55%; borderline concentric LVH;    History of stress test 06/21/2010   exercise; normal study   Hypertension    MET TEST 08/15/2011   moderate peak VO2 limitation at 66% predicted; mod cardiac impairment with low SV & low anaerobic threshold, mild chronotropic  incompetence; low risk   Nausea    Osteomyelitis (HCC)    right great toe   Renal disorder    Renal insufficiency    Speech impediment    Thrombocytopenia (HCC)    PMH       Home Medications Prior to Admission medications   Medication Sig Start Date End Date Taking? Authorizing Provider  ACCU-CHEK GUIDE test strip USE AS DIRECTED TO TEST TWICE DAILY 02/01/22   [provider]  acetaminophen  (TYLENOL ) 500 MG tablet Take 500-1,000 mg by mouth every 6 (six) hours as needed for mild pain or headache. Pain    [provider]  atorvastatin  (LIPITOR) 10 MG tablet Take 10 mg by mouth at bedtime. 09/21/17   [provider]  B Complex-C (B-COMPLEX WITH VITAMIN C) tablet Take 1 tablet by mouth daily.    [provider]  buPROPion  (WELLBUTRIN  XL) 150 MG 24 hr tablet Take 150 mg by mouth at bedtime.  08/29/12   [provider]  cephALEXin  (KEFLEX ) 500 MG capsule Take 1 capsule (500 mg total) by mouth 2 (two) times daily for 21 days. 03/04/23 03/25/23  Ricky Fines, MD  cetirizine (ZYRTEC) 10 MG tablet Take 10 mg by mouth daily as needed for allergies.    [provider]  Cholecalciferol  (VITAMIN D ) 50 MCG (2000 UT) CAPS Take 2,000 Units by mouth daily.     [provider]  doxycycline  (VIBRA -TABS) 100 MG tablet Take 1 tablet (  100 mg total) by mouth 2 (two) times daily. 03/04/23 04/15/23  Ricky Fines, MD  ferrous sulfate  325 (65 FE) MG EC tablet Take 325 mg by mouth daily with breakfast.    [provider]  glipiZIDE (GLUCOTROL) 5 MG tablet Take 5 mg by mouth 2 (two) times daily. 05/05/22   [provider]  hydrALAZINE  (APRESOLINE ) 25 MG tablet TAKE 1 TABLET BY MOUTH IN THE MORNING AND AT BEDTIME. MAY TAKE ADDITIONAL AS NEEDED FOR SYSTOLIC BLOOD PRESSURE CONSISTANTLY ABOVE 140 07/12/22   Miriam Norris, NP  metoprolol  succinate (TOPROL -XL) 100 MG 24 hr tablet TAKE 1 TABLET BY MOUTH DAILY. TAKE WITH OR IMMEDIATELY FOLLOWING A  MEAL 08/08/22   Miriam Norris, NP  Omega-3 Fatty Acids (FISH OIL) 1000 MG CAPS Take 1,000 mg by mouth 2 (two) times daily. Afternoon & bedtime.    [provider]  omeprazole  (PRILOSEC) 20 MG capsule TAKE ONE CAPSULE BY MOUTH DAILY. NEEDS OFFICE VISIT FOR FURTHER REFILLS. 01/24/22   Carlan, Chelsea L, NP  pioglitazone (ACTOS) 30 MG tablet Take 30 mg by mouth daily. 10/28/17   [provider]  saccharomyces boulardii (FLORASTOR) 250 MG capsule Take 1 capsule (250 mg total) by mouth 2 (two) times daily. 03/04/23   Ricky Fines, MD  sertraline  (ZOLOFT ) 100 MG tablet Take 100 mg by mouth at bedtime.  05/09/15   [provider]  sodium bicarbonate  650 MG tablet Take 650 mg by mouth 2 (two) times daily. 07/18/17   [provider]  TOUJEO  MAX SOLOSTAR 300 UNIT/ML Solostar Pen Inject 20 Units into the skin daily. 03/04/23   Ricky Fines, MD  ULORIC  80 MG TABS Take 80 mg by mouth daily.  04/21/15   [provider]      Allergies    Ivp dye [iodinated contrast media] and Tetanus toxoids    Review of Systems   Review of Systems A 10 point review of systems was performed and is negative unless otherwise reported in HPI.  Physical Exam Updated Vital Signs BP (!) 189/72 (BP Location: Left Arm)   Pulse (!) 104   Temp 98.4 F (36.9 C) (Oral)   Resp 17   Ht 6' 5 (1.956 m)   Wt 99.8 kg   SpO2 99%   BMI 26.09 kg/m  Physical Exam General: Normal appearing elderly male, lying in bed.  HEENT: Sclera anicteric, MMM, trachea midline.  Cardiology: RRR, no murmurs/rubs/gallops.  Resp: Intermittent dry cough. Normal respiratory rate and effort. CTAB, no wheezes, rhonchi, crackles.  Abd: Soft, non-tender, non-distended. No rebound tenderness or guarding.  GU: Deferred. MSK: No peripheral edema or signs of trauma. 2x2 cm wound on bottom of R foot in distal 1-2 metatarsal region that is nontender to palpation without purulent discharge or surrounding  erythema/induration. Extremities without deformity or TTP. No cyanosis or clubbing. Skin: warm, dry.  Back: No CVA tenderness Neuro: A&Ox4, CNs II-XII grossly intact. MAEs. Sensation grossly intact.  Psych: Normal mood and affect.       ED Results / Procedures / Treatments   Labs (all labs ordered are listed, but only abnormal results are displayed) Labs Reviewed  CBC WITH DIFFERENTIAL/PLATELET - Abnormal; Notable for the following components:      Result Value   Hemoglobin 12.8 (*)    All other components within normal limits  CBG MONITORING, ED - Abnormal; Notable for the following components:   Glucose-Capillary 181 (*)    All other components within normal limits  RESP PANEL BY RT-PCR (  RSV, FLU A&B, COVID)  [id number]  COMPREHENSIVE METABOLIC PANEL  MAGNESIUM  TROPONIN I (HIGH SENSITIVITY)    EKG EKG Interpretation Date/Time:  Saturday March 25 2023 07:42:46 EST Ventricular Rate:  95 PR Interval:  147 QRS Duration:  106 QT Interval:  360 QTC Calculation: 453 R Axis:   -38  Text Interpretation: Sinus rhythm Abnormal R-wave progression, early transition Left ventricular hypertrophy Similar to prior Confirmed by Franklyn Gills [phone]) on 03/25/2023 7:49:26 AM  Radiology CXR: Negative portable chest.   Procedures Procedures    Medications Ordered in ED Medications  lactated ringers  bolus 1,000 mL (0 mLs Intravenous Stopped 03/25/23 1050)  acetaminophen  (TYLENOL ) tablet 1,000 mg (1,000 mg Oral Given 03/25/23 0944)    ED Course/ Medical Decision Making/ A&P                          Medical Decision Making Amount and/or Complexity of Data Reviewed Labs: ordered. Decision-making details documented in ED Course. Radiology: ordered. Decision-making details documented in ED Course.  Risk OTC drugs.    This patient presents to the ED for concern of dizziness, sweating overnight, this involves an extensive number of treatment options, and is a complaint that carries  with it a high risk of complications and morbidity.  I considered the following differential and admission for this acute, potentially life threatening condition. Overall patient is mildly tachycardic to 104 bpm but otherwise is well-appearing, afebrile, not diaphoretic.   MDM:    Patient reports waking up feeling sweaty and dizzy.  Consider possible infection such as upper respiratory illness with report of cough like flu or COVID.  Also consider possible fevers and chills from his recent bout of osteomyelitis of the right metatarsal area.  The wound today, please see photo above, does not look overtly infected and he is currently afebrile.  Will assess with lab values.  Considered arrhythmia, ACS though he reports no chest pain or shortness of breath.  His EKG does not demonstrate any signs of ischemia or arrhythmia at this time and will continue to monitor on telemetry.  Echo from one year ago does not demonstrate any heart failure.  Labs do not demonstrate any severe metabolic derangements or electrolyte abnormalities, ischemia/ACS, anemia. He has no urinary sxs to indicate UTI.    Clinical Course as of 03/31/23 1319  Sat Mar 25, 2023  0748 Glucose-Capillary(!): 181 [HN]  [phone] Comprehensive metabolic panel(!) Essentially at his basleine, possible mild increase in BUN which could suggest dehydration [HN]  0844 Resp panel by RT-PCR (RSV, Flu A&B, Covid) Anterior Nasal Swab neg [HN]  0844 Troponin I (High Sensitivity): 4 neg [HN]  0844 CBC with Differential(!) No leukocytosis or worsening anemia [HN]  0929 DG Chest Portable 1 View Negative portable chest. [HN]  1007 Patient reevaluated. He states his headache has resolved after 1L IVF and tylenol . Patient has no complaints at this time and feels at his baseline. His labs are reassuring. I do not see an emergency medical condition present. Advised patient to stay well hydrated. Possible that he has a respiratory illness or virus given his  cough. Recommended OTC remedies.  Patient will be DCd back to his facility. Attempted to call patient's emergency contact Ronal Brought x2 but received no answer. A HIPAA compliant voice message was left. DC w/ discharge instructions/return precautions. All questions answered to patient's satisfaction.   [HN]    Clinical Course User Index [HN] Franklyn Gills SAILOR, MD  Ronal Brought was able to call back to the ED later in the day after the patient was discharged and I discussed with her what had happened, she reported understanding.    Labs: I Ordered, and personally interpreted labs.  The pertinent results include:  those listed above  Imaging Studies ordered: I ordered imaging studies including CXR I independently visualized and interpreted imaging. I agree with the radiologist interpretation  Additional history obtained from chart review.    Cardiac Monitoring: The patient was maintained on a cardiac monitor.  I personally viewed and interpreted the cardiac monitored which showed an underlying rhythm of: NSR  Reevaluation: After the interventions noted above, I reevaluated the patient and found that they have :resolved  Social Determinants of Health: Lives independently  Disposition:  DC w/ discharge instructions/return precautions. All questions answered to patient's  satisfaction.    Co morbidities that complicate the patient evaluation  Past Medical History:  Diagnosis Date   Anemia    Anxiety    Arthritis    Colon polyps    Depression    Diabetes mellitus without complication (HCC)    DM (diabetes mellitus) (HCC) 01/30/2019   Fatty liver    GERD (gastroesophageal reflux disease)    Gout    High cholesterol    History of echocardiogram 09/14/2011   EF >55%; borderline concentric LVH;    History of stress test 06/21/2010   exercise; normal study   Hypertension    MET TEST 08/15/2011   moderate peak VO2 limitation at 66% predicted; mod cardiac impairment with low SV & low  anaerobic threshold, mild chronotropic incompetence; low risk   Nausea    Osteomyelitis (HCC)    right great toe   Renal disorder    Renal insufficiency    Speech impediment    Thrombocytopenia (HCC)    PMH     Medicines No orders of the defined types were placed in this encounter.   I have reviewed the patients home medicines and have made adjustments as needed  Problem List / ED Course: Problem List Items Addressed This Visit   None Visit Diagnoses       Cough in adult    -  Primary     Dizziness                       This note was created using dictation software, which may contain spelling or grammatical errors.    Franklyn Sid SAILOR, MD 03/31/23 1323

## 2023-03-25 NOTE — ED Notes (Signed)
 Update given to [name]. Adriana Hopping states she is in [place] right now but she will try to find someone to come get pt.

## 2023-04-04 ENCOUNTER — Ambulatory Visit (INDEPENDENT_AMBULATORY_CARE_PROVIDER_SITE_OTHER): Payer: 59 | Admitting: Orthopedic Surgery

## 2023-04-04 DIAGNOSIS — M86271 Subacute osteomyelitis, right ankle and foot: Secondary | ICD-10-CM

## 2023-04-04 DIAGNOSIS — Z89431 Acquired absence of right foot: Secondary | ICD-10-CM

## 2023-04-05 ENCOUNTER — Encounter: Payer: Self-pay | Admitting: Orthopedic Surgery

## 2023-04-05 NOTE — Progress Notes (Signed)
 Office Visit Note   Patient: [name]           Date of Birth: 1951-01-07           MRN: [id number] Visit Date: 04/04/2023              Requested by: Elfredia Nevins, MD [address] [place],  [place] [address] PCP: Assunta Found, MD  Chief Complaint  Patient presents with   Right Foot - Pain      HPI: Patient is a 73 year old gentleman who is seen for evaluation for ulceration beneath the first metatarsal status post transmetatarsal amputation.  Amputation December 2023.  Patient has completed a course of doxycycline.  Assessment & Plan: Visit Diagnoses:  1. History of transmetatarsal amputation of right foot (HCC)   2. Subacute osteomyelitis of right foot (HCC)     Plan: Surgical intervention would require a partial first ray amputation.  Patient does not have sufficient glucose control or the ability to be compliant with nonweightbearing.  Patient would have increased risk of wound dehiscence and need for additional surgery.  Will proceed with conservative treatment at this time recommended wearing a shoe at all times and a Band-Aid.  Follow-Up Instructions: Return in about 4 weeks (around 05/02/2023).   Ortho Exam  Patient is alert, oriented, no adenopathy, well-dressed, normal affect, normal respiratory effort.  Patient's feet have the appearance that he does not wear shoes. Examination patient has a strong biphasic dorsalis pedis pulse by Doppler.  Review of the MRI scan and radiographs shows destruction of the residual first metatarsal.  Patient has a Wagner grade 3 ulcer that it does not extend to bone or tendon.  The ulcer is superficial 3 cm in diameter.  Most recent hemoglobin A1c 10.7.  Imaging: No results found. No images are attached to the encounter.  Labs: Lab Results  Component Value Date   HGBA1C 10.7 (H) 03/01/2023   HGBA1C 6.8 (H) 11/12/2021   HGBA1C 8.6 (H) 01/09/2019   ESRSEDRATE 16 03/02/2023   ESRSEDRATE 107 (H) 11/30/2021    ESRSEDRATE 44 (H) 11/11/2021   CRP <0.5 03/02/2023   CRP 22.9 (H) 11/30/2021   CRP 8.8 (H) 11/11/2021   LABURIC 6.9 01/09/2019   REPTSTATUS 03/06/2023 FINAL 03/01/2023   REPTSTATUS 03/06/2023 FINAL 03/01/2023   GRAMSTAIN  12/02/2021    ABUNDANT WBC PRESENT,BOTH PMN AND MONONUCLEAR ABUNDANT GRAM NEGATIVE RODS FEW GRAM POSITIVE COCCI IN PAIRS IN CHAINS    CULT  03/01/2023    NO GROWTH 5 DAYS Performed at [facility], [address] [place], [place] [address]    CULT  03/01/2023    NO GROWTH 5 DAYS Performed at [facility], [address] [place], [place] [address]    [facility] ENTEROCOCCUS FAECALIS 12/02/2021   LABORGA STREPTOCOCCUS MITIS/ORALIS 12/02/2021   LABORGA PROTEUS PENNERI 12/02/2021     Lab Results  Component Value Date   ALBUMIN 3.7 03/25/2023   ALBUMIN 3.6 03/01/2023   ALBUMIN 2.1 (L) 12/03/2021    Lab Results  Component Value Date   MG 2.1 03/25/2023   MG 2.2 12/07/2021   MG 2.0 12/03/2021   No results found for: "VD25OH"  No results found for: "PREALBUMIN"    Latest Ref Rng & Units 03/25/2023    7:22 AM 03/02/2023    4:22 AM 03/01/2023    8:03 AM  CBC EXTENDED  WBC 4.0 - 10.5 K/uL 7.0  5.8  6.7   RBC 4.22 - 5.81 MIL/uL 4.31  4.50  4.89   Hemoglobin 13.0 - 17.0 g/dL 16.1  09.6  04.5   HCT 39.0 - 52.0 % 39.1  39.1  41.6   Platelets 150 - 400 K/uL 195  180  195   NEUT# 1.7 - 7.7 K/uL 5.6     Lymph# 0.7 - 4.0 K/uL 0.7        There is no height or weight on file to calculate BMI.  Orders:  No orders of the defined types were placed in this encounter.  No orders of the defined types were placed in this encounter.    Procedures: No procedures performed  Clinical Data: No additional findings.  ROS:  All other systems negative, except as noted in the HPI. Review of Systems  Objective: Vital Signs: There were no vitals taken for this visit.  Specialty Comments:  No specialty comments available.  PMFS History: Patient Active Problem  List   Diagnosis Date Noted   Hyperglycemia 03/04/2023   Acute osteomyelitis of ankle or foot, right (HCC) 03/01/2023   Lactic acidosis 03/01/2023   History of transmetatarsal amputation of right foot (HCC) 04/06/2022   Cutaneous abscess of right foot 02/04/2022   Osteomyelitis of great toe of left foot (HCC)    Hyperlipidemia 11/30/2021   Bedbug bite with infection 11/30/2021    Class: Acute   AKI (acute kidney injury) (HCC) 11/12/2021   Sepsis (HCC) 11/11/2021   Amputated great toe of right foot (HCC) 02/20/2019   Stage 3a chronic kidney disease (HCC) 02/13/2019   Acute osteomyelitis (HCC)    DM (diabetes mellitus) (HCC) 01/30/2019   Fatty liver 01/09/2019   Thrombocytopenia, unspecified (HCC) 05/14/2013   Other specified abnormal findings of blood chemistry 05/14/2013   Colon adenoma 09/13/2012   Anxiety 09/06/2012   Intellectual disability 09/06/2012   Chest pain 09/06/2012   Gastroesophageal reflux disease 03/08/2012   Gout 09/06/2011   Hypertension 03/08/2011   Chronic kidney disease 11/30/2009   Urinary tract infection 11/30/2009   Past Medical History:  Diagnosis Date   Anemia    Anxiety    Arthritis    Colon polyps    Depression    Diabetes mellitus without complication (HCC)    DM (diabetes mellitus) (HCC) 01/30/2019   Fatty liver    GERD (gastroesophageal reflux disease)    Gout    High cholesterol    History of echocardiogram 09/14/2011   EF >55%; borderline concentric LVH;    History of stress test 06/21/2010   exercise; normal study   Hypertension    MET TEST 08/15/2011   moderate peak VO2 limitation at 66% predicted; mod cardiac impairment with low SV & low anaerobic threshold, mild chronotropic incompetence; low risk   Nausea    Osteomyelitis (HCC)    right great toe   Renal disorder    Renal insufficiency    Speech impediment    Thrombocytopenia (HCC)    PMH    Family History  Problem Relation Age of Onset   Colon cancer Brother     Past  Surgical History:  Procedure Laterality Date   AMPUTATION Right 02/13/2019   Procedure: RIGHT GREAT TOE AMPUTATION;  Surgeon: Nadara Mustard, MD;  Location[facility] OR;  Service: Orthopedics;  Laterality: Right;   AMPUTATION Right 02/04/2022   Procedure: RIGHT TRANSMETATARSAL AMPUTATION APPLICATION OF WOUND VAC;  Surgeon: Nadara Mustard, MD;  Location: [facility] OR;  Service: Orthopedics;  Laterality: Right;   AMPUTATION TOE Left 12/02/2021   Procedure: AMPUTATION TOE;  Surgeon: Lovell Sheehan,  Loraine Leriche, MD;  Location: [facility] ORS;  Service: General;  Laterality: Left;   AMPUTATION TOE Left 12/06/2021   Procedure: CLOSURE OF LEFT GREAT TOE AMPUTATION;  Surgeon: Franky Macho, MD;  Location: [facility] ORS;  Service: General;  Laterality: Left;   CHOLECYSTECTOMY     COLONOSCOPY N/A 09/21/2012   Procedure: COLONOSCOPY;  Surgeon: Malissa Hippo, MD;  Location: [facility] ENDO SUITE;  Service: Endoscopy;  Laterality: N/A;  730   COLONOSCOPY N/A 01/30/2019   rehman, eight 4-7 mm polyps in rectum in sigmoid and at splenic flexure, transverse colon and in cecum, one 9mm polyp in recto-sigmoid colon, clip was placed, nine polyps total removed   ESOPHAGOGASTRODUODENOSCOPY  11/2005   normal mucosa and GE junction   HERNIA REPAIR     x 2   HIP SURGERY     Rt hip age 57   NASAL POLYP SURGERY      2-3 months ago   POLYPECTOMY  01/30/2019   Procedure: POLYPECTOMY;  Surgeon: Malissa Hippo, MD;  Location: [facility] SUITE;  Service: Endoscopy;;  proximal transverse colon, cecal   Social History   Occupational History   Not on file  Tobacco Use   Smoking status: Never   Smokeless tobacco: Never  Vaping Use   Vaping status: Never Used  Substance and Sexual Activity   Alcohol use: No   Drug use: No   Sexual activity: Not on file

## 2023-04-23 ENCOUNTER — Other Ambulatory Visit: Payer: Self-pay | Admitting: Nurse Practitioner

## 2023-05-03 ENCOUNTER — Inpatient Hospital Stay (HOSPITAL_COMMUNITY)
Admission: EM | Admit: 2023-05-03 | Discharge: 2023-05-05 | DRG: 064 | Disposition: A | Discharge status: Home / Self Care | Attending: Internal Medicine | Admitting: Internal Medicine

## 2023-05-03 ENCOUNTER — Emergency Department (HOSPITAL_COMMUNITY)

## 2023-05-03 ENCOUNTER — Encounter (HOSPITAL_COMMUNITY): Payer: Self-pay | Admitting: *Deleted

## 2023-05-03 ENCOUNTER — Inpatient Hospital Stay (HOSPITAL_COMMUNITY)

## 2023-05-03 ENCOUNTER — Other Ambulatory Visit: Payer: Self-pay

## 2023-05-03 DIAGNOSIS — I6523 Occlusion and stenosis of bilateral carotid arteries: Secondary | ICD-10-CM | POA: Diagnosis present

## 2023-05-03 DIAGNOSIS — I639 Cerebral infarction, unspecified: Secondary | ICD-10-CM | POA: Diagnosis present

## 2023-05-03 DIAGNOSIS — I10 Essential (primary) hypertension: Secondary | ICD-10-CM | POA: Diagnosis present

## 2023-05-03 DIAGNOSIS — M8660 Other chronic osteomyelitis, unspecified site: Secondary | ICD-10-CM | POA: Diagnosis present

## 2023-05-03 DIAGNOSIS — M1A09X0 Idiopathic chronic gout, multiple sites, without tophus (tophi): Secondary | ICD-10-CM

## 2023-05-03 DIAGNOSIS — Z880 Allergy status to penicillin: Secondary | ICD-10-CM

## 2023-05-03 DIAGNOSIS — F419 Anxiety disorder, unspecified: Secondary | ICD-10-CM | POA: Diagnosis present

## 2023-05-03 DIAGNOSIS — E1169 Type 2 diabetes mellitus with other specified complication: Secondary | ICD-10-CM | POA: Diagnosis present

## 2023-05-03 DIAGNOSIS — Z794 Long term (current) use of insulin: Secondary | ICD-10-CM | POA: Diagnosis not present

## 2023-05-03 DIAGNOSIS — I618 Other nontraumatic intracerebral hemorrhage: Principal | ICD-10-CM | POA: Diagnosis present

## 2023-05-03 DIAGNOSIS — F32A Depression, unspecified: Secondary | ICD-10-CM | POA: Diagnosis present

## 2023-05-03 DIAGNOSIS — K760 Fatty (change of) liver, not elsewhere classified: Secondary | ICD-10-CM | POA: Diagnosis present

## 2023-05-03 DIAGNOSIS — D649 Anemia, unspecified: Secondary | ICD-10-CM | POA: Diagnosis present

## 2023-05-03 DIAGNOSIS — M7731 Calcaneal spur, right foot: Secondary | ICD-10-CM | POA: Diagnosis not present

## 2023-05-03 DIAGNOSIS — Z800 Family history of malignant neoplasm of digestive organs: Secondary | ICD-10-CM

## 2023-05-03 DIAGNOSIS — E872 Acidosis, unspecified: Secondary | ICD-10-CM | POA: Diagnosis present

## 2023-05-03 DIAGNOSIS — E785 Hyperlipidemia, unspecified: Secondary | ICD-10-CM | POA: Diagnosis not present

## 2023-05-03 DIAGNOSIS — E7800 Pure hypercholesterolemia, unspecified: Secondary | ICD-10-CM | POA: Diagnosis present

## 2023-05-03 DIAGNOSIS — I129 Hypertensive chronic kidney disease with stage 1 through stage 4 chronic kidney disease, or unspecified chronic kidney disease: Secondary | ICD-10-CM | POA: Diagnosis present

## 2023-05-03 DIAGNOSIS — Z89411 Acquired absence of right great toe: Secondary | ICD-10-CM

## 2023-05-03 DIAGNOSIS — Z7984 Long term (current) use of oral hypoglycemic drugs: Secondary | ICD-10-CM | POA: Diagnosis not present

## 2023-05-03 DIAGNOSIS — E11649 Type 2 diabetes mellitus with hypoglycemia without coma: Secondary | ICD-10-CM | POA: Diagnosis present

## 2023-05-03 DIAGNOSIS — E1140 Type 2 diabetes mellitus with diabetic neuropathy, unspecified: Secondary | ICD-10-CM | POA: Diagnosis present

## 2023-05-03 DIAGNOSIS — E1165 Type 2 diabetes mellitus with hyperglycemia: Secondary | ICD-10-CM | POA: Diagnosis present

## 2023-05-03 DIAGNOSIS — R471 Dysarthria and anarthria: Secondary | ICD-10-CM | POA: Diagnosis present

## 2023-05-03 DIAGNOSIS — R29818 Other symptoms and signs involving the nervous system: Secondary | ICD-10-CM | POA: Diagnosis not present

## 2023-05-03 DIAGNOSIS — G936 Cerebral edema: Secondary | ICD-10-CM | POA: Diagnosis present

## 2023-05-03 DIAGNOSIS — Z79899 Other long term (current) drug therapy: Secondary | ICD-10-CM | POA: Diagnosis not present

## 2023-05-03 DIAGNOSIS — R519 Headache, unspecified: Secondary | ICD-10-CM | POA: Diagnosis not present

## 2023-05-03 DIAGNOSIS — R29702 NIHSS score 2: Secondary | ICD-10-CM | POA: Diagnosis not present

## 2023-05-03 DIAGNOSIS — Z91041 Radiographic dye allergy status: Secondary | ICD-10-CM

## 2023-05-03 DIAGNOSIS — G4489 Other headache syndrome: Secondary | ICD-10-CM | POA: Diagnosis not present

## 2023-05-03 DIAGNOSIS — E119 Type 2 diabetes mellitus without complications: Secondary | ICD-10-CM

## 2023-05-03 DIAGNOSIS — Z860100 Personal history of colon polyps, unspecified: Secondary | ICD-10-CM

## 2023-05-03 DIAGNOSIS — I499 Cardiac arrhythmia, unspecified: Secondary | ICD-10-CM | POA: Diagnosis not present

## 2023-05-03 DIAGNOSIS — R140 Abdominal distension (gaseous): Secondary | ICD-10-CM | POA: Diagnosis not present

## 2023-05-03 DIAGNOSIS — N183 Chronic kidney disease, stage 3 unspecified: Secondary | ICD-10-CM | POA: Diagnosis not present

## 2023-05-03 DIAGNOSIS — I619 Nontraumatic intracerebral hemorrhage, unspecified: Secondary | ICD-10-CM | POA: Diagnosis not present

## 2023-05-03 DIAGNOSIS — E1122 Type 2 diabetes mellitus with diabetic chronic kidney disease: Secondary | ICD-10-CM | POA: Diagnosis present

## 2023-05-03 DIAGNOSIS — N1831 Chronic kidney disease, stage 3a: Secondary | ICD-10-CM | POA: Diagnosis present

## 2023-05-03 DIAGNOSIS — M19071 Primary osteoarthritis, right ankle and foot: Secondary | ICD-10-CM | POA: Diagnosis not present

## 2023-05-03 DIAGNOSIS — M109 Gout, unspecified: Secondary | ICD-10-CM | POA: Diagnosis present

## 2023-05-03 DIAGNOSIS — R6889 Other general symptoms and signs: Secondary | ICD-10-CM | POA: Diagnosis not present

## 2023-05-03 DIAGNOSIS — I161 Hypertensive emergency: Secondary | ICD-10-CM | POA: Diagnosis present

## 2023-05-03 DIAGNOSIS — M86671 Other chronic osteomyelitis, right ankle and foot: Secondary | ICD-10-CM | POA: Diagnosis present

## 2023-05-03 DIAGNOSIS — I160 Hypertensive urgency: Secondary | ICD-10-CM

## 2023-05-03 DIAGNOSIS — E1121 Type 2 diabetes mellitus with diabetic nephropathy: Secondary | ICD-10-CM

## 2023-05-03 DIAGNOSIS — K219 Gastro-esophageal reflux disease without esophagitis: Secondary | ICD-10-CM | POA: Diagnosis present

## 2023-05-03 DIAGNOSIS — Z887 Allergy status to serum and vaccine status: Secondary | ICD-10-CM

## 2023-05-03 DIAGNOSIS — R9082 White matter disease, unspecified: Secondary | ICD-10-CM | POA: Diagnosis not present

## 2023-05-03 DIAGNOSIS — G9389 Other specified disorders of brain: Secondary | ICD-10-CM | POA: Diagnosis not present

## 2023-05-03 DIAGNOSIS — Z743 Need for continuous supervision: Secondary | ICD-10-CM | POA: Diagnosis not present

## 2023-05-03 DIAGNOSIS — R739 Hyperglycemia, unspecified: Secondary | ICD-10-CM | POA: Diagnosis not present

## 2023-05-03 DIAGNOSIS — I6782 Cerebral ischemia: Secondary | ICD-10-CM | POA: Diagnosis not present

## 2023-05-03 DIAGNOSIS — M86271 Subacute osteomyelitis, right ankle and foot: Secondary | ICD-10-CM

## 2023-05-03 LAB — CBC WITH DIFFERENTIAL/PLATELET
Abs Immature Granulocytes: 0.06 10*3/uL (ref 0.00–0.07)
Basophils Absolute: 0 10*3/uL (ref 0.0–0.1)
Basophils Relative: 1 %
Eosinophils Absolute: 0.1 10*3/uL (ref 0.0–0.5)
Eosinophils Relative: 2 %
HCT: 40.6 % (ref 39.0–52.0)
Hemoglobin: 13.4 g/dL (ref 13.0–17.0)
Immature Granulocytes: 1 %
Lymphocytes Relative: 13 %
Lymphs Abs: 0.8 10*3/uL (ref 0.7–4.0)
MCH: 29.5 pg (ref 26.0–34.0)
MCHC: 33 g/dL (ref 30.0–36.0)
MCV: 89.2 fL (ref 80.0–100.0)
Monocytes Absolute: 0.6 10*3/uL (ref 0.1–1.0)
Monocytes Relative: 10 %
Neutro Abs: 4.6 10*3/uL (ref 1.7–7.7)
Neutrophils Relative %: 73 %
Platelets: 164 10*3/uL (ref 150–400)
RBC: 4.55 MIL/uL (ref 4.22–5.81)
RDW: 13.9 % (ref 11.5–15.5)
WBC: 6.2 10*3/uL (ref 4.0–10.5)
nRBC: 0 % (ref 0.0–0.2)

## 2023-05-03 LAB — BASIC METABOLIC PANEL
Anion gap: 9 (ref 5–15)
BUN: 24 mg/dL — ABNORMAL HIGH (ref 8–23)
CO2: 25 mmol/L (ref 22–32)
Calcium: 8.4 mg/dL — ABNORMAL LOW (ref 8.9–10.3)
Chloride: 101 mmol/L (ref 98–111)
Creatinine, Ser: 1.25 mg/dL — ABNORMAL HIGH (ref 0.61–1.24)
GFR, Estimated: >60 mL/min (ref >60)
Glucose, Bld: 268 mg/dL — ABNORMAL HIGH (ref 70–99)
Potassium: 3.8 mmol/L (ref 3.5–5.1)
Sodium: 135 mmol/L (ref 135–145)

## 2023-05-03 LAB — GLUCOSE, CAPILLARY: Glucose-Capillary: 228 mg/dL — ABNORMAL HIGH (ref 70–99)

## 2023-05-03 LAB — URINALYSIS, ROUTINE W REFLEX MICROSCOPIC
Bacteria, UA: NONE SEEN
Bilirubin Urine: NEGATIVE
Glucose, UA: 50 mg/dL — AB
Hgb urine dipstick: NEGATIVE
Ketones, ur: NEGATIVE mg/dL
Leukocytes,Ua: NEGATIVE
Nitrite: NEGATIVE
Protein, ur: 100 mg/dL — AB
Specific Gravity, Urine: 1.012 (ref 1.005–1.030)
pH: 7 (ref 5.0–8.0)

## 2023-05-03 LAB — HEPATIC FUNCTION PANEL
ALT: 18 U/L (ref 0–44)
AST: 19 U/L (ref 15–41)
Albumin: 3.5 g/dL (ref 3.5–5.0)
Alkaline Phosphatase: 67 U/L (ref 38–126)
Bilirubin, Direct: 0.1 mg/dL (ref 0.0–0.2)
Indirect Bilirubin: 0.3 mg/dL (ref 0.3–0.9)
Total Bilirubin: 0.4 mg/dL (ref 0.0–1.2)
Total Protein: 6.7 g/dL (ref 6.5–8.1)

## 2023-05-03 LAB — CBG MONITORING, ED: Glucose-Capillary: 175 mg/dL — ABNORMAL HIGH (ref 70–99)

## 2023-05-03 LAB — TROPONIN I (HIGH SENSITIVITY)
Troponin I (High Sensitivity): 5 ng/L (ref <18)
Troponin I (High Sensitivity): 5 ng/L (ref <18)

## 2023-05-03 LAB — PROTIME-INR
INR: 1 (ref 0.8–1.2)
Prothrombin Time: 13.1 s (ref 11.4–15.2)

## 2023-05-03 MED ORDER — FEBUXOSTAT 40 MG PO TABS
80.0000 mg | ORAL_TABLET | Freq: Every day | ORAL | Status: DC
Start: 2023-05-04 — End: 2023-05-05
  Administered 2023-05-04 – 2023-05-05 (×2): 80 mg via ORAL
  Filled 2023-05-03 (×2): qty 2

## 2023-05-03 MED ORDER — INSULIN GLARGINE 100 UNIT/ML ~~LOC~~ SOLN
20.0000 [IU] | Freq: Every day | SUBCUTANEOUS | Status: DC
  Administered 2023-05-03 – 2023-05-04 (×2): 20 [IU] via SUBCUTANEOUS
  Filled 2023-05-03 (×3): qty 0.2

## 2023-05-03 MED ORDER — BUPROPION HCL ER (XL) 150 MG PO TB24
150.0000 mg | ORAL_TABLET | Freq: Every day | ORAL | Status: DC
  Administered 2023-05-03 – 2023-05-04 (×2): 150 mg via ORAL
  Filled 2023-05-03 (×2): qty 1

## 2023-05-03 MED ORDER — LABETALOL HCL 5 MG/ML IV SOLN
10.0000 mg | Freq: Once | INTRAVENOUS | Status: AC
  Administered 2023-05-03: 10 mg via INTRAVENOUS
  Filled 2023-05-03: qty 4

## 2023-05-03 MED ORDER — ACETAMINOPHEN 160 MG/5ML PO SOLN: 650.0000 mg | ORAL | Status: DC | PRN

## 2023-05-03 MED ORDER — ACETAMINOPHEN 650 MG RE SUPP: 650.0000 mg | RECTAL | Status: DC | PRN

## 2023-05-03 MED ORDER — FERROUS SULFATE 325 (65 FE) MG PO TABS
325.0000 mg | ORAL_TABLET | Freq: Every day | ORAL | Status: DC
  Administered 2023-05-04 – 2023-05-05 (×2): 325 mg via ORAL
  Filled 2023-05-03 (×2): qty 1

## 2023-05-03 MED ORDER — LABETALOL HCL 5 MG/ML IV SOLN
20.0000 mg | INTRAVENOUS | Status: DC | PRN
  Administered 2023-05-03 (×2): 20 mg via INTRAVENOUS
  Filled 2023-05-03 (×2): qty 4

## 2023-05-03 MED ORDER — METOPROLOL SUCCINATE ER 100 MG PO TB24
100.0000 mg | ORAL_TABLET | Freq: Every day | ORAL | Status: DC
  Administered 2023-05-04 – 2023-05-05 (×2): 100 mg via ORAL
  Filled 2023-05-03 (×2): qty 1

## 2023-05-03 MED ORDER — INSULIN ASPART 100 UNIT/ML IJ SOLN
0.0000 [IU] | Freq: Three times a day (TID) | INTRAMUSCULAR | Status: DC
  Administered 2023-05-03: 2 [IU] via SUBCUTANEOUS
  Administered 2023-05-04 (×2): 3 [IU] via SUBCUTANEOUS
  Administered 2023-05-04: 5 [IU] via SUBCUTANEOUS
  Administered 2023-05-05: 3 [IU] via SUBCUTANEOUS
  Administered 2023-05-05: 2 [IU] via SUBCUTANEOUS
  Filled 2023-05-03: qty 1

## 2023-05-03 MED ORDER — SODIUM CHLORIDE 0.9 % IV BOLUS
500.0000 mL | Freq: Once | INTRAVENOUS | Status: AC
  Administered 2023-05-03: 500 mL via INTRAVENOUS

## 2023-05-03 MED ORDER — GADOBUTROL 1 MMOL/ML IV SOLN
9.0000 mL | Freq: Once | INTRAVENOUS | Status: AC | PRN
  Administered 2023-05-03: 9 mL via INTRAVENOUS

## 2023-05-03 MED ORDER — STROKE: EARLY STAGES OF RECOVERY BOOK
Freq: Once | Status: AC
  Filled 2023-05-03 (×2): qty 1

## 2023-05-03 MED ORDER — SODIUM BICARBONATE 650 MG PO TABS
650.0000 mg | ORAL_TABLET | Freq: Two times a day (BID) | ORAL | Status: DC
  Administered 2023-05-03 – 2023-05-05 (×4): 650 mg via ORAL
  Filled 2023-05-03 (×4): qty 1

## 2023-05-03 MED ORDER — INSULIN ASPART 100 UNIT/ML IJ SOLN
0.0000 [IU] | Freq: Every day | INTRAMUSCULAR | Status: DC
  Administered 2023-05-03: 2 [IU] via SUBCUTANEOUS

## 2023-05-03 MED ORDER — SERTRALINE HCL 100 MG PO TABS
100.0000 mg | ORAL_TABLET | Freq: Every day | ORAL | Status: DC
  Administered 2023-05-03 – 2023-05-04 (×2): 100 mg via ORAL
  Filled 2023-05-03 (×2): qty 1

## 2023-05-03 MED ORDER — SACCHAROMYCES BOULARDII 250 MG PO CAPS
250.0000 mg | ORAL_CAPSULE | Freq: Two times a day (BID) | ORAL | Status: DC
  Administered 2023-05-04 – 2023-05-05 (×3): 250 mg via ORAL
  Filled 2023-05-03 (×4): qty 1

## 2023-05-03 MED ORDER — VITAMIN D 25 MCG (1000 UNIT) PO TABS
2000.0000 [IU] | ORAL_TABLET | Freq: Every day | ORAL | Status: DC
  Administered 2023-05-04 – 2023-05-05 (×2): 2000 [IU] via ORAL
  Filled 2023-05-03 (×4): qty 2

## 2023-05-03 MED ORDER — INSULIN GLARGINE-YFGN 100 UNIT/ML ~~LOC~~ SOLN
20.0000 [IU] | Freq: Every day | SUBCUTANEOUS | Status: DC
Start: 2023-05-03 — End: 2023-05-03
  Filled 2023-05-03 (×2): qty 0.2

## 2023-05-03 MED ORDER — PANTOPRAZOLE SODIUM 40 MG PO TBEC
40.0000 mg | DELAYED_RELEASE_TABLET | Freq: Every day | ORAL | Status: DC
  Administered 2023-05-04 – 2023-05-05 (×2): 40 mg via ORAL
  Filled 2023-05-03 (×2): qty 1

## 2023-05-03 MED ORDER — HYDRALAZINE HCL 25 MG PO TABS
25.0000 mg | ORAL_TABLET | Freq: Three times a day (TID) | ORAL | Status: DC
  Administered 2023-05-03 – 2023-05-05 (×6): 25 mg via ORAL
  Filled 2023-05-03 (×6): qty 1

## 2023-05-03 MED ORDER — ACETAMINOPHEN 325 MG PO TABS
650.0000 mg | ORAL_TABLET | ORAL | Status: DC | PRN
  Administered 2023-05-04: 650 mg via ORAL
  Filled 2023-05-03: qty 2

## 2023-05-03 MED ORDER — SENNOSIDES-DOCUSATE SODIUM 8.6-50 MG PO TABS: 2.0000 | ORAL_TABLET | Freq: Every evening | ORAL | Status: DC | PRN

## 2023-05-03 MED ORDER — ATORVASTATIN CALCIUM 10 MG PO TABS
10.0000 mg | ORAL_TABLET | Freq: Every day | ORAL | Status: DC
  Administered 2023-05-03 – 2023-05-04 (×2): 10 mg via ORAL
  Filled 2023-05-03 (×2): qty 1

## 2023-05-03 NOTE — Assessment & Plan Note (Signed)
-  Appears to be stable and at baseline -Will continue minimizing nephrotoxic agents, maintaining adequate oral hydration and will follow renal function trend.

## 2023-05-03 NOTE — ED Notes (Signed)
 Patient transported to MRI

## 2023-05-03 NOTE — Assessment & Plan Note (Signed)
-  No edema or midline shifting appreciated -Neurology service consulted and will follow patient once he arrived to [facility] -Repeat CT scan in the morning -Continue blood pressure control with goal for systolic blood pressure less than 160, -SCDs for DVT prophylaxis at the moment -No large vessel occlusion appreciated; will check 2D echo -Checking lipid panel and A1c. -Providing as needed analgesics and Tylenol as patient was complaining of headaches.

## 2023-05-03 NOTE — ED Provider Notes (Signed)
 [facility] Provider Note   CSN: [id number] Arrival date & time: 05/03/23  4098     History  Chief Complaint  Patient presents with   Headache    [name] is a 73 y.o. male.  Pt is a 73 yo male with pmhx significant for htn, hld, osteomyelitis right great toe, DM, gerd, arthritis, depression, thrombocytopenia, and ckd.  Pt presents to the ED via ambulance today due to headaches.  He's had headaches for the last week.  EMS reports HR 130 and SBP 220 upon their arrival.  EMS gave pt 500 cc NS en route.  Pt said he did vomit once this am and it was black.  Headache and nausea improved.  He said he took his meds this am.       Home Medications Prior to Admission medications   Medication Sig Start Date End Date Taking? Authorizing Provider  ACCU-CHEK GUIDE test strip USE AS DIRECTED TO TEST TWICE DAILY 02/01/22   [provider]  acetaminophen (TYLENOL) 500 MG tablet Take 500-1,000 mg by mouth every 6 (six) hours as needed for mild pain or headache. Pain    [provider]  atorvastatin (LIPITOR) 10 MG tablet Take 10 mg by mouth at bedtime. 09/21/17   [provider]  B Complex-C (B-COMPLEX WITH VITAMIN C) tablet Take 1 tablet by mouth daily.    [provider]  buPROPion (WELLBUTRIN XL) 150 MG 24 hr tablet Take 150 mg by mouth at bedtime.  08/29/12   [provider]  cetirizine (ZYRTEC) 10 MG tablet Take 10 mg by mouth daily as needed for allergies.    [provider]  Cholecalciferol (VITAMIN D) 50 MCG (2000 UT) CAPS Take 2,000 Units by mouth daily.     [provider]  ferrous sulfate 325 (65 FE) MG EC tablet Take 325 mg by mouth daily with breakfast.    [provider]  glipiZIDE (GLUCOTROL) 5 MG tablet Take 5 mg by mouth 2 (two) times daily. 05/05/22   [provider]  hydrALAZINE (APRESOLINE) 25 MG tablet TAKE 1 TABLET BY MOUTH IN THE MORNING AND AT  BEDTIME. MAY TAKE ADDITIONAL AS NEEDED FOR SYSTOLIC BLOOD PRESSURE CONSISTENTLY ABOVE 140. 04/24/23   Branch, Dorothe Pea, MD  metoprolol succinate (TOPROL-XL) 100 MG 24 hr tablet TAKE 1 TABLET BY MOUTH DAILY. TAKE WITH OR IMMEDIATELY FOLLOWING A MEAL 08/08/22   Sharlene Dory, NP  Omega-3 Fatty Acids (FISH OIL) 1000 MG CAPS Take 1,000 mg by mouth 2 (two) times daily. Afternoon & bedtime.    [provider]  omeprazole (PRILOSEC) 20 MG capsule TAKE ONE CAPSULE BY MOUTH DAILY. NEEDS OFFICE VISIT FOR FURTHER REFILLS. 01/24/22   Carlan, Chelsea L, NP  pioglitazone (ACTOS) 30 MG tablet Take 30 mg by mouth daily. 10/28/17   [provider]  saccharomyces boulardii (FLORASTOR) 250 MG capsule Take 1 capsule (250 mg total) by mouth 2 (two) times daily. 03/04/23   Vassie Loll, MD  sertraline (ZOLOFT) 100 MG tablet Take 100 mg by mouth at bedtime.  05/09/15   [provider]  sodium bicarbonate 650 MG tablet Take 650 mg by mouth 2 (two) times daily. 07/18/17   [provider]  TOUJEO MAX SOLOSTAR 300 UNIT/ML Solostar Pen Inject 20 Units into the skin daily. 03/04/23   Vassie Loll, MD  ULORIC 80 MG TABS Take 80 mg by mouth daily.  04/21/15   [provider]  Allergies    Ivp dye [iodinated contrast media], Penicillins, and Tetanus toxoids    Review of Systems   Review of Systems  Gastrointestinal:  Positive for nausea.  Neurological:  Positive for headaches.  All other systems reviewed and are negative.   Physical Exam Updated Vital Signs BP (!) 147/73   Pulse 99   Temp 98.5 F (36.9 C) (Oral)   Resp 15   Ht 6\' 5"  (1.956 m)   Wt 90.7 kg   SpO2 96%   BMI 23.72 kg/m  Physical Exam Vitals and nursing note reviewed.  Constitutional:      Appearance: He is well-developed.  HENT:     Head: Normocephalic and atraumatic.     Mouth/Throat:     Mouth: Mucous membranes are moist.     Pharynx: Oropharynx is clear.  Eyes:     Extraocular Movements:  Extraocular movements intact.     Pupils: Pupils are equal, round, and reactive to light.  Cardiovascular:     Rate and Rhythm: Normal rate and regular rhythm.  Pulmonary:     Effort: Pulmonary effort is normal.     Breath sounds: Normal breath sounds.  Abdominal:     General: Bowel sounds are normal.     Palpations: Abdomen is soft.  Musculoskeletal:        General: Normal range of motion.     Cervical back: Normal range of motion and neck supple.     Comments: Right foot s/p transmetatarsal amp.  Chronic sore under right foot under 1st metatarsal.  Left food s/p right large toe amp.  Skin:    General: Skin is warm and dry.  Neurological:     Mental Status: He is alert and oriented to person, place, and time.  Psychiatric:        Mood and Affect: Mood normal.     ED Results / Procedures / Treatments   Labs (all labs ordered are listed, but only abnormal results are displayed) Labs Reviewed  BASIC METABOLIC PANEL - Abnormal; Notable for the following components:      Result Value   Glucose, Bld 268 (*)    BUN 24 (*)    Creatinine, Ser 1.25 (*)    Calcium 8.4 (*)    All other components within normal limits  CBC WITH DIFFERENTIAL/PLATELET  PROTIME-INR  HEPATIC FUNCTION PANEL  URINALYSIS, ROUTINE W REFLEX MICROSCOPIC  TROPONIN I (HIGH SENSITIVITY)  TROPONIN I (HIGH SENSITIVITY)    EKG None  Radiology MR BRAIN W CONTRAST Result Date: 05/03/2023 CLINICAL DATA:  Headache. EXAM: MRI HEAD WITH CONTRAST TECHNIQUE: Multiplanar, multiecho pulse sequences of the brain and surrounding structures were obtained with intravenous contrast. CONTRAST:  9mL GADAVIST GADOBUTROL 1 MMOL/ML IV SOLN COMPARISON:  Noncontrast head MRI and CT earlier today FINDINGS: Faint, non-masslike enhancement is present in the left corona radiata at the site of the signal abnormality detailed on today's earlier noncontrast MRI. No abnormal intracranial enhancement is identified elsewhere. Normal vascular  enhancement is present. IMPRESSION: Faint, non-masslike enhancement in the left corona radiata at the site of the suspected recent microhemorrhage on today's earlier noncontrast MRI. This has a benign appearance and is favored to reflect mild blood-brain barrier breakdown related to the hemorrhage and possibly an associated small surrounding subacute infarct. A follow-up MRI may be worthwhile in 2-3 months to ensure resolution. Electronically Signed   By: Sebastian Ache M.D.   On: 05/03/2023 13:46   MR FOOT RIGHT WO CONTRAST Result Date: 05/03/2023 CLINICAL  DATA:  Concern for right foot osteomyelitis.  Infection. EXAM: MRI OF THE RIGHT FOREFOOT WITHOUT CONTRAST TECHNIQUE: Multiplanar, multisequence MR imaging of the right foot was performed. No intravenous contrast was administered. COMPARISON:  Right foot radiographs 03/01/2023, MRI right foot 03/01/2023 FINDINGS: Bones/Joint/Cartilage Postsurgical changes are again seen of amputation of the first through fifth metatarsals through the mid shafts, not significantly changed from 02/27/2023. There is mild interval decrease in size of the transverse dimension of marrow edema surrounding the previously described chronic defect/channel within the distal, mid transverse aspect of the first metatarsal amputation margin. Overall this marrow edema measures up to approximately 6 x 14 x 7 mm (transverse by long axis of the foot by short axis of the foot, coronal series 8, image 8 and sagittal series 4, image 18) compared to 8 x 15 x 8 mm on 03/01/2023. The borders of the marrow edema or now more well-defined. This may indicate minimal interval decrease in the prior osteomyelitis/healing of the bone. There is resolution of the prior patchy marrow edema within the lateral navicular. Near resolution of the prior marrow edema within the medial cuneiform a, now with minimal residual marrow edema seen at the distal lateral aspect (coronal series 8, image 10). The marrow edema  within the lateral base of the fourth metatarsal also appears to have resolved. Moderate fourth tarsometatarsal cartilage thinning with mild peripheral osteophytosis. Mild third and fifth tarsometatarsal peripheral degenerative spurring. Moderate-to-large plantar calcaneal heel spur and posterior calcaneal heel spur at the plantar fascia and Achilles tendon insertions, respectively. Ligaments Muscles and Tendons Unchanged moderate chronic thickening at the distal Achilles tendon insertionw. Mild edema and high-grade fatty infiltration of the regional musculature, unchanged from prior. Soft tissues There is a small region of blooming artifact related to air within a soft tissue ulcer plantar to the distal aspect of the first metatarsal amputation stump, likely slightly decreased in size from prior. This now appears to measure up to 10 mm along the longitudinal dimension of the foot compared to 17 mm previously (sagittal series 4, image 16). IMPRESSION: Compared to 03/01/2023: 1. 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 2. Slight interval decrease in size of the soft tissue ulcer distal and plantar to the first metatarsal amputation site. 3. Minimal interval decrease in size of predominantly the transverse dimension of marrow edema surrounding the previously described chronic defect/channel within the distal, mid transverse aspect of the first metatarsal amputation margin. The borders of the marrow edema are now more well-defined. This may indicate minimal interval decrease in the prior osteomyelitis and mild interval healing of the bone. No worsening or new areas concerning for acute osteomyelitis. 4. Resolution of the prior patchy marrow edema within the lateral navicular. Near resolution of the prior marrow edema within the medial cuneiform, now with minimal residual marrow edema seen at the distal lateral aspect.  The marrow edema within the lateral base of the fourth metatarsal also appears to have resolved. 5. Moderate fourth tarsometatarsal osteoarthritis. Electronically Signed   By: Neita Garnet M.D.   On: 05/03/2023 13:14   DG Chest Portable 1 View Result Date: 05/03/2023 CLINICAL DATA:  73 year old male with acute headache, altered mental status. Hypertensive, systolic 220. EXAM: PORTABLE CHEST 1 VIEW COMPARISON:  Portable chest 03/25/2023. FINDINGS: Portable AP upright view at 1017 hours. Stable cardiac size at the upper limits of normal. Other mediastinal contours are within normal limits. Visualized tracheal air column is  within normal limits. Allowing for portable technique the lungs are clear. No pneumothorax or pleural effusion. No acute osseous abnormality identified. Paucity of bowel gas. IMPRESSION: No acute cardiopulmonary abnormality. Electronically Signed   By: Odessa Fleming M.D.   On: 05/03/2023 10:57   MR ANGIO HEAD WO CONTRAST Result Date: 05/03/2023 CLINICAL DATA:  73 year old male neurologic deficit. New onset headache with 5 mm left corona radiata microhemorrhage. EXAM: MRA HEAD WITHOUT CONTRAST TECHNIQUE: Angiographic images of the Circle of Willis were acquired using MRA technique without intravenous contrast. COMPARISON:  Head CT, MRI today reported separately. FINDINGS: Anterior circulation: Antegrade flow in both ICA siphons. Bilateral siphon irregularity in keeping with atherosclerosis. No significant siphon stenosis. Ophthalmic artery origins appear within normal limits. Patent carotid termini. Normal MCA and ACA origins. Tortuous A1 segments. Diminutive or absent anterior communicating artery. Mildly dominant left ACA. Visible ACA branches are within normal limits. Bilateral MCA M1 segments and MCA trifurcations appear patent without stenosis. Visible bilateral MCA branches are within normal limits. Posterior circulation: Antegrade flow in the distal vertebral arteries which appear codominant.  Patent PICA origins. Patent vertebrobasilar junction and basilar artery without evidence of stenosis. Normal SCA and patent PCA origins. Posterior communicating arteries are diminutive or absent. Mild PCA tortuosity and irregularity, no significant stenosis. Anatomic variants: None. Other: Left corona radiata area of the suspected microhemorrhage by CT and MRI is on series 18, image 3 with no evidence of abnormal vascularity. IMPRESSION: 1. Intracranial MRA negative aside from evidence of intracranial atherosclerosis. No hemodynamically significant stenosis. 2. Abnormal Brain MRI today reported separately. Electronically Signed   By: Odessa Fleming M.D.   On: 05/03/2023 10:56   MR BRAIN WO CONTRAST Result Date: 05/03/2023 CLINICAL DATA:  73 year old male neurologic deficit. New onset headache with indeterminate 5 mm left corona radiata hyperdensity on head CT. EXAM: MRI HEAD WITHOUT CONTRAST TECHNIQUE: Multiplanar, multiecho pulse sequences of the brain and surrounding structures were obtained without intravenous contrast. COMPARISON:  Head CT 0852 hours today. FINDINGS: Brain: Left corona radiata area of concern based on earlier head CT demonstrates confluent 14 mm area of T2 and FLAIR hyperintensity with T2 shine through on DWI (series 5, image 25) and subtle susceptibility on SWI (series 13, image 38). No regional mass effect. Other scattered white matter T2 and FLAIR hyperintensity although none as confluent as that in the left corona radiata. No restricted diffusion to suggest acute infarction. No midline shift, mass effect, evidence of mass lesion, ventriculomegaly, extra-axial collection. No cortical encephalomalacia identified. No other cerebral blood products identified on SWI. Deep gray nuclei, and brainstem remain within normal limits. Cervicomedullary junction and pituitary are within normal limits. Possible small chronic linear lacunar infarct in the left lateral cerebellum series 10, image 5. Vascular:  Major intracranial vascular flow voids are preserved. Skull and upper cervical spine: Visualized bone marrow signal is within normal limits. Negative for age visible cervical spine. Sinuses/Orbits: Negative orbits. Paranasal Visualized paranasal sinuses and mastoids are stable and well aerated. Other: Visible internal auditory structures appear normal. IMPRESSION: 1. Constellation of signal abnormality in the left corona radiata at the CT abnormality is most consistent with an Acute or Subacute Microhemorrhage. Mild surrounding edema but no mass effect or other complicating features. 2. Negative for superimposed acute infarct and otherwise mild for age signal changes of chronic small vessel disease. 3. MRA reported separately. Electronically Signed   By: Odessa Fleming M.D.   On: 05/03/2023 10:52   CT HEAD WO CONTRAST Result Date: 05/03/2023  CLINICAL DATA:  73 year old male with new onset headache. EXAM: CT HEAD WITHOUT CONTRAST TECHNIQUE: Contiguous axial images were obtained from the base of the skull through the vertex without intravenous contrast. RADIATION DOSE REDUCTION: This exam was performed according to the departmental dose-optimization program which includes automated exposure control, adjustment of the mA and/or kV according to patient size and/or use of iterative reconstruction technique. COMPARISON:  Head CT 04/19/2011. FINDINGS: Brain: Cerebral volume remains normal for age. Incidental choroid plexus cysts, normal variant. No midline shift, ventriculomegaly, mass effect, or evidence of cortically based acute infarction. Increased mild to moderate for age scattered and mostly periventricular white matter hypodensity. Superimposed small slightly round and amorphous 5 mm hyperdensity in the left anterior corona radiata (series 2, image 19 and series 4, image 33. This is not definitely calcified (Hounsfield units up to 90) Regional hypodensity but no regional mass effect. No other evidence of intracranial  hemorrhage. And elsewhere gray-white differentiation is maintained. Vascular: Calcified atherosclerosis at the skull base. No suspicious intracranial vascular hyperdensity. Skull: Stable and intact. Sinuses/Orbits: Visualized paranasal sinuses and mastoids are clear. Other: Visualized orbits and scalp soft tissues are within normal limits. IMPRESSION: 1. Progressed cerebral white matter disease since 2013 with a 5 mm indeterminate hyperdensity in the anterior left corona radiata. Differential considerations include an Acute small-vessel related micro-Hemorrhage versus dystrophic calcification, or chronic blood products. No associated intracranial mass effect. Follow-up Brain MRI (without and with contrast preferred) might characterize further. 2. No other acute intracranial abnormality by CT. Electronically Signed   By: Odessa Fleming M.D.   On: 05/03/2023 09:07    Procedures Procedures    Medications Ordered in ED Medications  sodium chloride 0.9 % bolus 500 mL (500 mLs Intravenous New Bag/Given 05/03/23 0901)  labetalol (NORMODYNE) injection 10 mg (10 mg Intravenous Given 05/03/23 1206)  gadobutrol (GADAVIST) 1 MMOL/ML injection 9 mL (9 mLs Intravenous Contrast Given 05/03/23 1304)  labetalol (NORMODYNE) injection 10 mg (10 mg Intravenous Given 05/03/23 1338)    ED Course/ Medical Decision Making/ A&P                                 Medical Decision Making Amount and/or Complexity of Data Reviewed Labs: ordered. Radiology: ordered.  Risk Prescription drug management. Decision regarding hospitalization.   This patient presents to the ED for concern of headache, this involves an extensive number of treatment options, and is a complaint that carries with it a high risk of complications and morbidity.  The differential diagnosis includes ich, htn, electrolyte abn, infection, migraine   Co morbidities that complicate the patient evaluation  htn, hld, osteomyelitis, DM, gerd, arthritis,  depression, thrombocytopenia, and ckd   Additional history obtained:  Additional history obtained from epic chart review External records from outside source obtained and reviewed including EMS report   Lab Tests:  I Ordered, and personally interpreted labs.  The pertinent results include:  cbc nl, bmp nl other than glucose elevated at 268, bun 24 and cr 1.25 (stable), lfts nl, trop nl, inr nl   Imaging Studies ordered:  I ordered imaging studies including ct head and mri/mra I independently visualized and interpreted imaging which showed  CT head: Progressed cerebral white matter disease since 2013 with a 5 mm  indeterminate hyperdensity in the anterior left corona radiata.  Differential considerations include an Acute small-vessel related  micro-Hemorrhage versus dystrophic calcification, or chronic blood  products.  No associated  intracranial mass effect.    Follow-up Brain MRI (without and with contrast preferred) might  characterize further.    2. No other acute intracranial abnormality by CT.  MRI brain: Constellation of signal abnormality in the left corona radiata at  the CT abnormality is most consistent with an Acute or Subacute  Microhemorrhage. Mild surrounding edema but no mass effect or other  complicating features.    2. Negative for superimposed acute infarct and otherwise mild for  age signal changes of chronic small vessel disease.    3. MRA reported separately.  MRA brain: Intracranial MRA negative aside from evidence of intracranial  atherosclerosis. No hemodynamically significant stenosis.  2. Abnormal Brain MRI today reported separately.  MRI foot: . Postsurgical changes of amputation of the first through fifth  metatarsals through the mid shafts, not significantly changed from  03/01/2023.  2. Slight interval decrease in size of the soft tissue ulcer distal  and plantar to the first metatarsal amputation site.  3. Minimal interval decrease in  size of predominantly the transverse  dimension of marrow edema surrounding the previously described  chronic defect/channel within the distal, mid transverse aspect of  the first metatarsal amputation margin. The borders of the marrow  edema are now more well-defined. This may indicate minimal interval  decrease in the prior osteomyelitis and mild interval healing of the  bone. No worsening or new areas concerning for acute osteomyelitis.  4. Resolution of the prior patchy marrow edema within the lateral  navicular. Near resolution of the prior marrow edema within the  medial cuneiform, now with minimal residual marrow edema seen at the  distal lateral aspect. The marrow edema within the lateral base of  the fourth metatarsal also appears to have resolved.  5. Moderate fourth tarsometatarsal osteoarthritis.  MRI brain with contrast: Faint, non-masslike enhancement in the left corona radiata at the  site of the suspected recent microhemorrhage on today's earlier  noncontrast MRI. This has a benign appearance and is favored to  reflect mild blood-brain barrier breakdown related to the hemorrhage  and possibly an associated small surrounding subacute infarct. A  follow-up MRI may be worthwhile in 2-3 months to ensure resolution.   I agree with the radiologist interpretation   Cardiac Monitoring:  The patient was maintained on a cardiac monitor.  I personally viewed and interpreted the cardiac monitored which showed an underlying rhythm of: st   Medicines ordered and prescription drug management:  I ordered medication including labetalol  for sx  Reevaluation of the patient after these medicines showed that the patient improved I have reviewed the patients home medicines and have made adjustments as needed   Test Considered:  mri   Critical Interventions:  mri   Consultations Obtained:  I requested consultation with the neurologist (Dr. Amada Jupiter),  and discussed lab  and imaging findings as well as pertinent plan - he recommends admission to hospitalist service to [facility].  Repeat CT in AM.  He also recommends sbp<150.  Dr. Otelia Limes also recommended a MRI with contrast.   Pt d/w Dr. Gwenlyn Perking ([facility]) for admission   Problem List / ED Course:  Wound to right foot with subacute osteomyelitis:  pt is followed by Dr. Lajoyce Corners.  He saw him last on 2/18 and he is trying to treat wound nonoperatively.  Nothing acute to do now. CVA:  pt d/w neurology who recommend admission to [facility] and repeat CT in am HTN:  keep sbp 130-150.  Bp is improving.  Reevaluation:  After the interventions noted above, I reevaluated the patient and found that they have :improved   Social Determinants of Health:  Lives at home   Dispostion:  After consideration of the diagnostic results and the patients response to treatment, I feel that the patent would benefit from admission.  CRITICAL CARE Performed by: Jacalyn Lefevre   Total critical care time: 30 minutes  Critical care time was exclusive of separately billable procedures and treating other patients.  Critical care was necessary to treat or prevent imminent or life-threatening deterioration.  Critical care was time spent personally by me on the following activities: development of treatment plan with patient and/or surrogate as well as nursing, discussions with consultants, evaluation of patient's response to treatment, examination of patient, obtaining history from patient or surrogate, ordering and performing treatments and interventions, ordering and review of laboratory studies, ordering and review of radiographic studies, pulse oximetry and re-evaluation of patient's condition.           Final Clinical Impression(s) / ED Diagnoses Final diagnoses:  Cerebrovascular accident (CVA), unspecified mechanism (HCC)  Subacute osteomyelitis of right foot (HCC)  Hypertension, unspecified type    Rx / DC Orders ED Discharge  Orders     None         Jacalyn Lefevre, MD 05/03/23 1358

## 2023-05-03 NOTE — Assessment & Plan Note (Signed)
-  Update lipid panel -Continue statin. 

## 2023-05-03 NOTE — Assessment & Plan Note (Signed)
-  Uncontrolled -Resuming metoprolol and adjusting dose of hydralazine -As needed labetalol will be provided to achieve recommended goals by neurology service. -At time of admission patient experiencing hypertensive emergency.  -Follow vital signs closely.

## 2023-05-03 NOTE — H&P (Signed)
 History and Physical    Patient: [name] HQI:[id number] DOB: 12-Jul-1950 DOA: 05/03/2023 DOS: the patient was seen and examined on 05/03/2023 PCP: Assunta Found, MD  Patient coming from: Home  Chief Complaint:  Chief Complaint  Patient presents with   Headache   HPI: [name] is a 73 y.o. 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  Patient reports symptom has been present for about 1 week now and not improving. In the ED demonstrating significant elevated blood pressure. Patient reports no blurred vision or focal neurologic deficits; he has associated nausea and 1 episode of vomiting along with the headache.  Workup demonstrating subacute micro hemorrhagic stroke; case discussed with neurology service who recommended admission to [facility] for further evaluation and management.  Patient is not chronically on any anticoagulation therapy or aspirin treatment.  Review of Systems: As mentioned in the history of present illness. All other systems reviewed and are negative. Past Medical History:  Diagnosis Date   Anemia    Anxiety    Arthritis    Colon polyps    Depression    Diabetes mellitus without complication (HCC)    DM (diabetes mellitus) (HCC) 01/30/2019   Fatty liver    GERD (gastroesophageal reflux disease)    Gout    High cholesterol    History of echocardiogram 09/14/2011   EF >55%; borderline concentric LVH;    History of stress test 06/21/2010   exercise; normal study   Hypertension    MET TEST 08/15/2011   moderate peak VO2 limitation at 66% predicted; mod  cardiac impairment with low SV & low anaerobic threshold, mild chronotropic incompetence; low risk   Nausea    Osteomyelitis (HCC)    right great toe   Renal disorder    Renal insufficiency    Speech impediment    Thrombocytopenia (HCC)    PMH   Past Surgical History:  Procedure Laterality Date   AMPUTATION Right 02/13/2019   Procedure: RIGHT GREAT TOE AMPUTATION;  Surgeon: Nadara Mustard, MD;  Location: [facility] OR;  Service: Orthopedics;  Laterality: Right;   AMPUTATION Right 02/04/2022   Procedure: RIGHT TRANSMETATARSAL AMPUTATION APPLICATION OF WOUND VAC;  Surgeon: Nadara Mustard, MD;  Location: [facility] OR;  Service: Orthopedics;  Laterality: Right;   AMPUTATION TOE Left 12/02/2021   Procedure: AMPUTATION TOE;  Surgeon: Franky Macho, MD;  Location: [facility] ORS;  Service: General;  Laterality: Left;   AMPUTATION TOE Left 12/06/2021   Procedure: CLOSURE OF LEFT GREAT TOE AMPUTATION;  Surgeon: Franky Macho, MD;  Location: [facility] ORS;  Service: General;  Laterality: Left;   CHOLECYSTECTOMY     COLONOSCOPY N/A 09/21/2012   Procedure: COLONOSCOPY;  Surgeon: Malissa Hippo, MD;  Location: [facility] ENDO SUITE;  Service: Endoscopy;  Laterality: N/A;  730   COLONOSCOPY N/A 01/30/2019   rehman, eight 4-7 mm polyps in rectum in sigmoid and at splenic flexure, transverse colon and in cecum, one 9mm polyp in recto-sigmoid colon, clip was placed, nine polyps total removed   ESOPHAGOGASTRODUODENOSCOPY  11/2005   normal mucosa and GE junction   HERNIA REPAIR     x 2   HIP SURGERY     Rt hip  age 23   NASAL POLYP SURGERY      2-3 months ago   POLYPECTOMY  01/30/2019   Procedure: POLYPECTOMY;  Surgeon: Malissa Hippo, MD;  Location: [facility] END[facility] SU[facility];  Service: Endoscopy;;  proximal transverse colon, cecal   Social History:  reports that he has never smoked. He has never used smokeless tobacco. He reports that he does not drink alcohol and does not use drugs.  Allergies  Allergen Reactions   Ivp Dye [Iodinated  Contrast Media] Other (See Comments)    Sped heart rate up    Penicillins Rash    Pt tolerated amoxicillin oral challenge   Tetanus Toxoids Rash    Family History  Problem Relation Age of Onset   Colon cancer Brother     Prior to Admission medications   Medication Sig Start Date End Date Taking? Authorizing Provider  ACCU-CHEK GUIDE test strip USE AS DIRECTED TO TEST TWICE DAILY 02/01/22   [provider]  acetaminophen (TYLENOL) 500 MG tablet Take 500-1,000 mg by mouth every 6 (six) hours as needed for mild pain or headache. Pain    [provider]  atorvastatin (LIPITOR) 10 MG tablet Take 10 mg by mouth at bedtime. 09/21/17   [provider]  B Complex-C (B-COMPLEX WITH VITAMIN C) tablet Take 1 tablet by mouth daily.    [provider]  buPROPion (WELLBUTRIN XL) 150 MG 24 hr tablet Take 150 mg by mouth at bedtime.  08/29/12   [provider]  cetirizine (ZYRTEC) 10 MG tablet Take 10 mg by mouth daily as needed for allergies.    [provider]  Cholecalciferol (VITAMIN D) 50 MCG (2000 UT) CAPS Take 2,000 Units by mouth daily.     [provider]  ferrous sulfate 325 (65 FE) MG EC tablet Take 325 mg by mouth daily with breakfast.    [provider]  glipiZIDE (GLUCOTROL) 5 MG tablet Take 5 mg by mouth 2 (two) times daily. 05/05/22   [provider]  hydrALAZINE (APRESOLINE) 25 MG tablet TAKE 1 TABLET BY MOUTH IN THE MORNING AND AT BEDTIME. MAY TAKE ADDITIONAL AS NEEDED FOR SYSTOLIC BLOOD PRESSURE CONSISTENTLY ABOVE 140. 04/24/23   Branch, Dorothe Pea, MD  metoprolol succinate (TOPROL-XL) 100 MG 24 hr tablet TAKE 1 TABLET BY MOUTH DAILY. TAKE WITH OR IMMEDIATELY FOLLOWING A MEAL 08/08/22   Sharlene Dory, NP  Omega-3 Fatty Acids (FISH OIL) 1000 MG CAPS Take 1,000 mg by mouth 2 (two) times daily. Afternoon & bedtime.    [provider]  omeprazole (PRILOSEC) 20 MG capsule TAKE ONE CAPSULE BY MOUTH DAILY.  NEEDS OFFICE VISIT FOR FURTHER REFILLS. 01/24/22   Carlan, Chelsea L, NP  pioglitazone (ACTOS) 30 MG tablet Take 30 mg by mouth daily. 10/28/17   [provider]  saccharomyces boulardii (FLORASTOR) 250 MG capsule Take 1 capsule (250 mg total) by mouth 2 (two) times daily. 03/04/23   Vassie Loll, MD  sertraline (ZOLOFT) 100 MG tablet Take 100 mg by mouth at bedtime.  05/09/15   [provider]  sodium bicarbonate 650 MG tablet Take 650 mg by mouth 2 (two) times daily. 07/18/17   [provider]  TOUJEO MAX SOLOSTAR 300 UNIT/ML Solostar Pen Inject 20 Units into the skin daily. 03/04/23   Vassie Loll, MD  ULORIC 80 MG TABS Take 80 mg by mouth daily.  04/21/15   [provider]    Physical Exam: Vitals:   05/03/23 1200 05/03/23 1245 05/03/23 1341  05/03/23 1600  BP: (!) 174/86 (!) 166/81 (!) 147/73 (!) 164/95  Pulse: (!) 101 91 99 87  Resp: 17 15  15   Temp:      TempSrc:      SpO2: 97% 98% 96% 99%  Weight:      Height:       General exam: Alert, awake, oriented x 3; slow to respond but adequately.  Denies any further episode of nausea or vomiting.  Expressed to be hungry.  He has passed swallowing test in the ED. Respiratory system: Good saturation on room air; no using accessory muscle. Cardiovascular system: Sinus tachycardia present initially at the moment of admission; no rubs, no gallops, no murmur on exam. Gastrointestinal system: Abdomen is nondistended, soft and nontender. No organomegaly or masses felt. Normal bowel sounds heard. Central nervous system: No focal neurological deficits. Extremities: No cyanosis or clubbing; right foot transmetatarsal amputation with plantar spot from previous osteomyelitis process.  Left first ray amputation no open wound currently seen. Skin: No petechiae. Psychiatry: Judgement and insight appear normal. Mood & affect appropriate.   Data Reviewed: Basic metabolic panel: Sodium 135, potassium 3.8, chloride 101,  bicarb 25, BUN 24, creatinine 1.25 and GFR 60 CBC: WBC 6.2, hemoglobin 13.4 and platelet count 164K Troponin: 5   Assessment and Plan: Hemorrhagic stroke (HCC) -No edema or midline shifting appreciated -Neurology service consulted and will follow patient once he arrived to [facility] -Repeat CT scan in the morning -Continue blood pressure control with goal for systolic blood pressure less than 160, -SCDs for DVT prophylaxis at the moment -No large vessel occlusion appreciated; will check 2D echo -Checking lipid panel and A1c. -Providing as needed analgesics and Tylenol as patient was complaining of headaches.  Hypertension -Uncontrolled -Resuming metoprolol and adjusting dose of hydralazine -As needed labetalol will be provided to achieve recommended goals by neurology service. -At time of admission patient experiencing hypertensive emergency.  -Follow vital signs closely.  Gout -No acute flare -Continue Uloric.  Gastroesophageal reflux disease -Continue PPI.  DM (diabetes mellitus) (HCC) -Poorly controlled and with hyperglycemia; at baseline with chronic kidney disease stage IIIa -Follow CBGs fluctuation and adjust hypoglycemic regimen as needed -Sliding scale insulin and Semglee has been started.  Stage 3a chronic kidney disease (HCC) -Appears to be stable and at baseline -Will continue minimizing nephrotoxic agents, maintaining adequate oral hydration and will follow renal function trend.  Hyperlipidemia -Update lipid panel -Continue statin  Chronic osteomyelitis (HCC) -continue outpatient follow up with Dr. Lajoyce Corners -appears stable and currently not demonstrating worsening infection. -patient is afebrile and with normal WBC's     Advance Care Planning:   Code Status: Full Code   Consults: Neurology service  Family Communication: No family at bedside.  Severity of Illness: The appropriate patient status for this patient is INPATIENT. Inpatient status is  judged to be reasonable and necessary in order to provide the required intensity of service to ensure the patient's safety. The patient's presenting symptoms, physical exam findings, and initial radiographic and laboratory data in the context of their chronic comorbidities is felt to place them at high risk for further clinical deterioration. Furthermore, it is not anticipated that the patient will be medically stable for discharge from the hospital within 2 midnights of admission.   * I certify that at the point of admission it is my clinical judgment that the patient will require inpatient hospital care spanning beyond 2 midnights from the point of admission due to high intensity of service,  high risk for further deterioration and high frequency of surveillance required.*  Author: Vassie Loll, MD 05/03/2023 5:47 PM  For on call review [email or web address].

## 2023-05-03 NOTE — Assessment & Plan Note (Signed)
-  continue outpatient follow up with Dr. Lajoyce Corners -appears stable and currently not demonstrating worsening infection. -patient is afebrile and with normal WBC's

## 2023-05-03 NOTE — ED Notes (Addendum)
 Per patient it is ok to give information to [employer]. [phone].

## 2023-05-03 NOTE — Assessment & Plan Note (Signed)
-  No acute flare -Continue Uloric.

## 2023-05-03 NOTE — Assessment & Plan Note (Signed)
 Continue PPI ?

## 2023-05-03 NOTE — Consult Note (Signed)
 NEUROLOGY CONSULT NOTE   Date of service: May 03, 2023 Patient Name: [name] MRN:  [id number] DOB:  Oct 04, 1950 Chief Complaint: "Headaches, abnormal MRI-transferred from [facility]" Requesting Provider: Vassie Loll, MD  History of Present Illness  TYTAN SANDATE is a 73 y.o. male with hx of type 2 diabetes with nephropathy, GERD, hypertension, hyperlipidemia, CKD stage IIIa, prior TMA of the left great toe with subsequent other surgeries, osteomyelitis of the right foot status post right TMA, presented to [facility] for evaluation of headaches that have been going on for a week.  He reports that he has headaches every morning when he wakes up, do get somewhat better during the day but have not completely gone away.  It was noted that he had significantly elevated blood pressures in the emergency department per the H&P-highest systolic 199. MRI brain was performed that revealed a signal abnormality in the left corona radiata consistent with acute/subacute microhemorrhage.  Mild surrounding edema but no mass effect.  He was discussed with the on-call neurologist and recommended to be transferred to [facility] for further evaluation.   LKW: Greater than 7 days-unclear exact time Modified rankin score: 2-Slight disability-UNABLE to perform all activities but does not need assistance Not candidate for IV thrombolysis or EVT due to being outside the window as well as microhemorrhage--unclear if it is a ischemic stroke with hemorrhagic conversion or a primary hemorrhage ICH Score:0  NIHSS components Score: Comment  1a Level of Conscious 0[x]  1[]  2[]  3[]      1b LOC Questions 0[x]  1[]  2[]       1c LOC Commands 0[x]  1[]  2[]       2 Best Gaze 0[x]  1[]  2[]       3 Visual 0[x]  1[]  2[]  3[]      4 Facial Palsy 0[x]  1[]  2[]  3[]      5a Motor Arm - left 0[x]  1[]  2[]  3[]  4[]  UN[]    5b Motor Arm - Right 0[x]  1[]  2[]  3[]  4[]  UN[]    6a Motor Leg - Left 0[x]  1[]  2[]  3[]  4[]   UN[]    6b Motor Leg - Right 0[x]  1[]  2[]  3[]  4[]  UN[]    7 Limb Ataxia 0[x]  1[]  2[]  3[]  UN[]     8 Sensory 0[x]  1[]  2[]  UN[]      9 Best Language 0[x]  1[]  2[]  3[]      10 Dysarthria 0[]  1[]  2[x]  UN[]      11 Extinct. and Inattention 0[x]  1[]  2[]       TOTAL: 2      ROS  Comprehensive ROS performed and pertinent positives documented in HPI    Past History   Past Medical History:  Diagnosis Date   Anemia    Anxiety    Arthritis    Colon polyps    Depression    Diabetes mellitus without complication (HCC)    DM (diabetes mellitus) (HCC) 01/30/2019   Fatty liver    GERD (gastroesophageal reflux disease)    Gout    High cholesterol    History of echocardiogram 09/14/2011   EF >55%; borderline concentric LVH;    History of stress test 06/21/2010   exercise; normal study   Hypertension    MET TEST 08/15/2011   moderate peak VO2 limitation at 66% predicted; mod cardiac impairment with low SV & low anaerobic threshold, mild chronotropic incompetence; low risk   Nausea    Osteomyelitis (HCC)    right great toe   Renal disorder    Renal insufficiency  Speech impediment    Thrombocytopenia (HCC)    PMH    Past Surgical History:  Procedure Laterality Date   AMPUTATION Right 02/13/2019   Procedure: RIGHT GREAT TOE AMPUTATION;  Surgeon: Nadara Mustard, MD;  Location: [facility] OR;  Service: Orthopedics;  Laterality: Right;   AMPUTATION Right 02/04/2022   Procedure: RIGHT TRANSMETATARSAL AMPUTATION APPLICATION OF WOUND VAC;  Surgeon: Nadara Mustard, MD;  Location: [facility] OR;  Service: Orthopedics;  Laterality: Right;   AMPUTATION TOE Left 12/02/2021   Procedure: AMPUTATION TOE;  Surgeon: Franky Macho, MD;  Location: [facility] ORS;  Service: General;  Laterality: Left;   AMPUTATION TOE Left 12/06/2021   Procedure: CLOSURE OF LEFT GREAT TOE AMPUTATION;  Surgeon: Franky Macho, MD;  Location: [facility] ORS;  Service: General;  Laterality: Left;   CHOLECYSTECTOMY     COLONOSCOPY N/A 09/21/2012   Procedure:  COLONOSCOPY;  Surgeon: Malissa Hippo, MD;  Location: [facility] END[facility];  Service: Endoscopy;  Laterality: N/A;  730   COLONOSCOPY N/A 01/30/2019   rehman, eight 4-7 mm polyps in rectum in sigmoid and at splenic flexure, transverse colon and in cecum, one 9mm polyp in recto-sigmoid colon, clip was placed, nine polyps total removed   ESOPHAGOGASTRODUODENOSCOPY  11/2005   normal mucosa and GE junction   HERNIA REPAIR     x 2   HIP SURGERY     Rt hip age 40   NASAL POLYP SURGERY      2-3 months ago   POLYPECTOMY  01/30/2019   Procedure: POLYPECTOMY;  Surgeon: Malissa Hippo, MD;  Location: [facility];  Service: Endoscopy;;  proximal transverse colon, cecal    Family History: Family History  Problem Relation Age of Onset   Colon cancer Brother     Social History  reports that he has never smoked. He has never used smokeless tobacco. He reports that he does not drink alcohol and does not use drugs.  Allergies  Allergen Reactions   Ivp Dye [Iodinated Contrast Media] Other (See Comments)    Sped heart rate up    Penicillins Rash    Pt tolerated amoxicillin oral challenge   Tetanus Toxoids Rash    Medications   Current Facility-Administered Medications:    [START ON 05/04/2023]  stroke: early stages of recovery book, , Does not apply, Once, Vassie Loll, MD   acetaminophen (TYLENOL) tablet 650 mg, 650 mg, Oral, Q4H PRN **OR** acetaminophen (TYLENOL) 160 MG/5ML solution 650 mg, 650 mg, Per Tube, Q4H PRN **OR** acetaminophen (TYLENOL) suppository 650 mg, 650 mg, Rectal, Q4H PRN, Vassie Loll, MD   atorvastatin (LIPITOR) tablet 10 mg, 10 mg, Oral, QHS, Vassie Loll, MD, 10 mg at 05/03/23 2234   buPROPion (WELLBUTRIN XL) 24 hr tablet 150 mg, 150 mg, Oral, QHS, Vassie Loll, MD, 150 mg at 05/03/23 2234   [START ON 05/04/2023] cholecalciferol (VITAMIN D3) tablet 2,000 Units, 2,000 Units, Oral, Daily, Vassie Loll, MD   Melene Muller ON 05/04/2023] febuxostat (ULORIC) tablet 80 mg, 80 mg,  Oral, Daily, Vassie Loll, MD   Melene Muller ON 05/04/2023] ferrous sulfate tablet 325 mg, 325 mg, Oral, Q breakfast, Vassie Loll, MD   hydrALAZINE (APRESOLINE) tablet 25 mg, 25 mg, Oral, Q8H, Vassie Loll, MD, 25 mg at 05/03/23 1945   insulin aspart (novoLOG) injection 0-5 Units, 0-5 Units, Subcutaneous, QHS, Vassie Loll, MD, 2 Units at 05/03/23 2234   insulin aspart (novoLOG) injection 0-9 Units, 0-9 Units, Subcutaneous, TID WC, Vassie Loll, MD, 2 Units at 05/03/23 1650  insulin glargine (LANTUS) injection 20 Units, 20 Units, Subcutaneous, QHS, Vassie Loll, MD, 20 Units at 11-May-2023 2325   labetalol (NORMODYNE) injection 20 mg, 20 mg, Intravenous, Q2H PRN, Vassie Loll, MD, 20 mg at 2023/05/11 2234   [START ON 05/04/2023] metoprolol succinate (TOPROL-XL) 24 hr tablet 100 mg, 100 mg, Oral, Daily, Vassie Loll, MD   Melene Muller ON 05/04/2023] pantoprazole (PROTONIX) EC tablet 40 mg, 40 mg, Oral, Daily, Vassie Loll, MD   Melene Muller ON 05/04/2023] saccharomyces boulardii (FLORASTOR) capsule 250 mg, 250 mg, Oral, BID, Vassie Loll, MD   senna-docusate (Senokot-S) tablet 2 tablet, 2 tablet, Oral, QHS PRN, Vassie Loll, MD   sertraline (ZOLOFT) tablet 100 mg, 100 mg, Oral, QHS, Vassie Loll, MD, 100 mg at May 11, 2023 2234   sodium bicarbonate tablet 650 mg, 650 mg, Oral, BID, Vassie Loll, MD, 650 mg at 05-11-2023 2234  Vitals   Vitals:   05-11-2023 1900 May 11, 2023 1945 05-11-23 2045 2023/05/11 2134  BP: (!) 159/94 (!) 175/97 (!) 185/85 (!) 176/84  Pulse: 88  93 98  Resp: (!) 21 16 (!) 22   Temp:    99.1 F (37.3 C)  TempSrc:    Oral  SpO2: 98%  97% 100%  Weight:      Height:        Body mass index is 23.72 kg/m.  Physical Exam   General: Well-developed well-nourished, somewhat poorly groomed man comfortably laying in bed HEENT: Normocephalic atraumatic CVs: Regular rhythm Neurological exam Awake alert oriented x 3 Moderate to severe dysarthria-?  Due to him being edentulous No  aphasia Cranial nerves II to XII intact Motor examination with no drift Sensation intact Coordination examination with no dysmetria  Labs/Imaging/Neurodiagnostic studies   CBC:  Recent Labs  Lab May 11, 2023 0900  WBC 6.2  NEUTROABS 4.6  HGB 13.4  HCT 40.6  MCV 89.2  PLT 164   Basic Metabolic Panel:  Lab Results  Component Value Date   NA 135 05-11-2023   K 3.8 05/11/2023   CO2 25 2023/05/11   GLUCOSE 268 (H) May 11, 2023   BUN 24 (H) 05-11-23   CREATININE 1.25 (H) May 11, 2023   CALCIUM 8.4 (L) 05-11-23   GFRNONAA >60 05-11-2023   GFRAA >60 02/13/2019   Lipid Panel: No results found for: "LDLCALC" HgbA1c:  Lab Results  Component Value Date   HGBA1C 10.7 (H) 03/01/2023   Urine Drug Screen:     Component Value Date/Time   LABOPIA NONE DETECTED 11/21/2021 2352   COCAINSCRNUR NONE DETECTED 11/21/2021 2352   LABBENZ NONE DETECTED 11/21/2021 2352   AMPHETMU NONE DETECTED 11/21/2021 2352   THCU NONE DETECTED 11/21/2021 2352   LABBARB NONE DETECTED 11/21/2021 2352    Alcohol Level     Component Value Date/Time   ETH <10 11/22/2021 0027   INR  Lab Results  Component Value Date   INR 1.0 May 11, 2023   APTT  Lab Results  Component Value Date   APTT 36 11/29/2021   Imaging personally reviewed  CT head: Progressed cerebral white matter with 5 mm indeterminate hyperdensity in the anterior left corona radiata-small vessel related microhemorrhage versus dystrophic calcification or chronic blood products.  MRI follow-up recommended  MRI brain without contrast and MRI brain with contrast reviewed: Constellation of signal abnormality in the left corona radiata at the CT abnormalities most consistent with an acute to subacute microhemorrhage.  Mild surrounding edema but no mass effect.  Negative for superimposed acute infarct and otherwise mild for age chronic small vessel disease.  On postcontrast  imaging, faint non-mass like enhancement is present in the left corona  radiata at the site of the signal abnormality seen on the noncontrasted MRI.  No abnormal intracranial enhancement identified elsewhere.  Appearance is favored to be benign and reflect mild blood-brain barrier breakdown related to a hemorrhage and possibly an associated small surrounding subacute infarct.  Follow-up MRI in 2 to 3 months recommended to ensure resolution  MR angio head without contrast-intracranial MRI shows evidence of intracranial atherosclerosis.  No acute findings.  ASSESSMENT   RIDDICK NUON is a 73 y.o. male coming in for evaluation of persistent headaches from the past 1 week in the setting of severely elevated blood pressures.  Has multiple cerebrovascular risk factors. MRI reveals a small area of acute to subacute microhemorrhage in the left frontal white matter-either a primary microhemorrhage or hemorrhagic transformation of an ischemic infarct. Nonetheless needs stroke risk factor workup.  Impression: Acute to subacute microhemorrhage versus subacute ischemic infarct with hemorrhagic transformation  RECOMMENDATIONS  Admit to hospitalist Frequent neurochecks Telemetry No antiplatelets or anticoagulants Symptoms ongoing for a week.  Came in extremely hypertensive.  Would recommend treating it as hypertensive urgency and as previously recommended by the on-call neurologist-systolic blood pressure goal for today is less than 160.  Eventually blood pressure goal will be normotension in the next 1 to 2 days with gradual bringing down on the blood pressure. 2D echo A1c Lipid panel SCDs for DVT prophylaxis Therapy assessments Stroke team will follow Preliminary plan was discussed by the on-call neurologist with the EDP/hospitalist  ______________________________________________________________________    Signed, Milon Dikes, MD [facility]

## 2023-05-03 NOTE — ED Triage Notes (Signed)
 Pt brought in by [facility] from home with c/o intermittent headache x 1 week. Upon EMS arrival, pt's HR was 130 and SBP was 220. EMS administered a NS IV bolus. Last HR was 110 and SBP 190 for EMS. Pt reports being compliant with his medications.

## 2023-05-03 NOTE — Assessment & Plan Note (Signed)
-  Poorly controlled and with hyperglycemia; at baseline with chronic kidney disease stage IIIa -Follow CBGs fluctuation and adjust hypoglycemic regimen as needed -Sliding scale insulin and Semglee has been started.

## 2023-05-03 NOTE — Plan of Care (Signed)
 I was called about abnormal brain MRI on Mr. Forge's.  He has had headache on and off for a week, and has been severely hypertensive.  CT reveals a tiny microhemorrhage in the left corona radiata, and MRI confirms there are blood products there.  With him having symptoms for so long, I would treat this as subacute, but would favor attempting control blood pressure with goal less than 150 but do not know that we need to be as aggressive in the subacute setting.  If admitted, he will need a repeat CT in the morning, neurology can consult after arrival to [facility].  Ritta Slot, MD [facility]   If 7pm- 7am, please page neurology on call as listed in AMION.

## 2023-05-04 ENCOUNTER — Inpatient Hospital Stay (HOSPITAL_COMMUNITY)

## 2023-05-04 DIAGNOSIS — M1A09X0 Idiopathic chronic gout, multiple sites, without tophus (tophi): Secondary | ICD-10-CM | POA: Diagnosis not present

## 2023-05-04 DIAGNOSIS — I10 Essential (primary) hypertension: Secondary | ICD-10-CM | POA: Diagnosis not present

## 2023-05-04 DIAGNOSIS — R29702 NIHSS score 2: Secondary | ICD-10-CM

## 2023-05-04 DIAGNOSIS — I618 Other nontraumatic intracerebral hemorrhage: Secondary | ICD-10-CM

## 2023-05-04 DIAGNOSIS — I639 Cerebral infarction, unspecified: Secondary | ICD-10-CM | POA: Diagnosis not present

## 2023-05-04 DIAGNOSIS — M8660 Other chronic osteomyelitis, unspecified site: Secondary | ICD-10-CM | POA: Diagnosis not present

## 2023-05-04 DIAGNOSIS — E1122 Type 2 diabetes mellitus with diabetic chronic kidney disease: Secondary | ICD-10-CM | POA: Diagnosis not present

## 2023-05-04 DIAGNOSIS — I619 Nontraumatic intracerebral hemorrhage, unspecified: Secondary | ICD-10-CM

## 2023-05-04 DIAGNOSIS — E785 Hyperlipidemia, unspecified: Secondary | ICD-10-CM | POA: Diagnosis not present

## 2023-05-04 DIAGNOSIS — I6523 Occlusion and stenosis of bilateral carotid arteries: Secondary | ICD-10-CM | POA: Diagnosis not present

## 2023-05-04 LAB — RAPID URINE DRUG SCREEN, HOSP PERFORMED
Amphetamines: NOT DETECTED
Barbiturates: NOT DETECTED
Benzodiazepines: NOT DETECTED
Cocaine: NOT DETECTED
Opiates: NOT DETECTED
Tetrahydrocannabinol: NOT DETECTED

## 2023-05-04 LAB — COMPREHENSIVE METABOLIC PANEL
ALT: 18 U/L (ref 0–44)
AST: 24 U/L (ref 15–41)
Albumin: 3.2 g/dL — ABNORMAL LOW (ref 3.5–5.0)
Alkaline Phosphatase: 41 U/L (ref 38–126)
Anion gap: 13 (ref 5–15)
BUN: 25 mg/dL — ABNORMAL HIGH (ref 8–23)
CO2: 20 mmol/L — ABNORMAL LOW (ref 22–32)
Calcium: 8.4 mg/dL — ABNORMAL LOW (ref 8.9–10.3)
Chloride: 102 mmol/L (ref 98–111)
Creatinine, Ser: 1.65 mg/dL — ABNORMAL HIGH (ref 0.61–1.24)
GFR, Estimated: 44 mL/min — ABNORMAL LOW (ref >60)
Glucose, Bld: 232 mg/dL — ABNORMAL HIGH (ref 70–99)
Potassium: 4.4 mmol/L (ref 3.5–5.1)
Sodium: 135 mmol/L (ref 135–145)
Total Bilirubin: 1.3 mg/dL — ABNORMAL HIGH (ref 0.0–1.2)
Total Protein: 6 g/dL — ABNORMAL LOW (ref 6.5–8.1)

## 2023-05-04 LAB — GLUCOSE, CAPILLARY
Glucose-Capillary: 211 mg/dL — ABNORMAL HIGH (ref 70–99)
Glucose-Capillary: 260 mg/dL — ABNORMAL HIGH (ref 70–99)
Glucose-Capillary: 248 mg/dL — ABNORMAL HIGH (ref 70–99)
Glucose-Capillary: 187 mg/dL — ABNORMAL HIGH (ref 70–99)

## 2023-05-04 LAB — CBC WITH DIFFERENTIAL/PLATELET
Abs Immature Granulocytes: 0.04 10*3/uL (ref 0.00–0.07)
Basophils Absolute: 0.1 10*3/uL (ref 0.0–0.1)
Basophils Relative: 1 %
Eosinophils Absolute: 0.1 10*3/uL (ref 0.0–0.5)
Eosinophils Relative: 2 %
HCT: 37.1 % — ABNORMAL LOW (ref 39.0–52.0)
Hemoglobin: 12.7 g/dL — ABNORMAL LOW (ref 13.0–17.0)
Immature Granulocytes: 1 %
Lymphocytes Relative: 14 %
Lymphs Abs: 0.9 10*3/uL (ref 0.7–4.0)
MCH: 29.7 pg (ref 26.0–34.0)
MCHC: 34.2 g/dL (ref 30.0–36.0)
MCV: 86.9 fL (ref 80.0–100.0)
Monocytes Absolute: 0.7 10*3/uL (ref 0.1–1.0)
Monocytes Relative: 11 %
Neutro Abs: 4.6 10*3/uL (ref 1.7–7.7)
Neutrophils Relative %: 71 %
Platelets: 171 10*3/uL (ref 150–400)
RBC: 4.27 MIL/uL (ref 4.22–5.81)
RDW: 14.1 % (ref 11.5–15.5)
WBC: 6.4 10*3/uL (ref 4.0–10.5)
nRBC: 0 % (ref 0.0–0.2)

## 2023-05-04 LAB — C DIFFICILE QUICK SCREEN W PCR REFLEX
C Diff antigen: NEGATIVE
C Diff interpretation: NOT DETECTED
C Diff toxin: NEGATIVE

## 2023-05-04 LAB — LIPID PANEL
Cholesterol: 176 mg/dL (ref 0–200)
HDL: 29 mg/dL — ABNORMAL LOW (ref >40)
LDL Cholesterol: 72 mg/dL (ref 0–99)
Total CHOL/HDL Ratio: 6.1 ratio
Triglycerides: 377 mg/dL — ABNORMAL HIGH (ref <150)
VLDL: 75 mg/dL — ABNORMAL HIGH (ref 0–40)

## 2023-05-04 LAB — ECHOCARDIOGRAM COMPLETE
AR max vel: 2.48 cm2
AV Area VTI: 2.22 cm2
AV Area mean vel: 2.19 cm2
AV Mean grad: 5 mmHg
AV Peak grad: 8.1 mmHg
Ao pk vel: 1.42 m/s
Calc EF: 68 %
Height: 77 in
S' Lateral: 3.2 cm
Single Plane A2C EF: 71.7 %
Single Plane A4C EF: 63.8 %
Weight: 3200 [oz_av]

## 2023-05-04 LAB — HEMOGLOBIN A1C
Hgb A1c MFr Bld: 8.1 % — ABNORMAL HIGH (ref 4.8–5.6)
Mean Plasma Glucose: 186 mg/dL

## 2023-05-04 LAB — MAGNESIUM: Magnesium: 1.8 mg/dL (ref 1.7–2.4)

## 2023-05-04 LAB — PHOSPHORUS: Phosphorus: 3.9 mg/dL (ref 2.5–4.6)

## 2023-05-04 MED ORDER — ENSURE ENLIVE PO LIQD
237.0000 mL | Freq: Two times a day (BID) | ORAL | Status: DC
  Administered 2023-05-04 – 2023-05-05 (×4): 237 mL via ORAL
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
## 2023-05-04 NOTE — Plan of Care (Signed)
 No acute changes.Pending urine drug test, ongoing care plan.   Problem: Nutritional: Goal: Maintenance of adequate nutrition will improve Outcome: Not Met (add Reason) Goal: Progress toward achieving an optimal weight will improve Outcome: Not Met (add Reason)   Problem: Skin Integrity: Goal: Risk for impaired skin integrity will decrease Outcome: Not Met (add Reason)   Problem: Tissue Perfusion: Goal: Adequacy of tissue perfusion will improve Outcome: Not Met (add Reason)   Problem: Ischemic Stroke/TIA Tissue Perfusion: Goal: Complications of ischemic stroke/TIA will be minimized Outcome: Not Met (add Reason)   Problem: Safety: Goal: Ability to remain free from injury will improve Outcome: Not Met (add Reason)   Problem: Pain Managment: Goal: General experience of comfort will improve and/or be controlled Outcome: Not Met (add Reason)

## 2023-05-04 NOTE — TOC Initial Note (Signed)
 Transition of Care [facility]) - Initial/Assessment Note    Patient Details  Name: [name] MRN: [id number] Date of Birth: 1951-01-16  Transition of Care [facility]) CM/SW Contact:    Kermit Balo, RN Phone Number: 05/04/2023, 1:40 PM  Clinical Narrative:                  Pt is from home alone. He has a niece that checks on him and provides him needed transportation.  Pt manages his own medications and states he takes them as ordered.  Current recommendations are for HHOT. Pt can't have HHOT alone.  TOC following.  Expected Discharge Plan: Home w Home Health Services Barriers to Discharge: Continued Medical Work up   Patient Goals and CMS Choice   CMS Medicare.gov Compare Post Acute Care list provided to:: Patient Choice offered to / list presented to : Patient      Expected Discharge Plan and Services     Post Acute Care Choice: Home Health Living arrangements for the past 2 months: Single Family Home                                      Prior Living Arrangements/Services Living arrangements for the past 2 months: Single Family Home Lives with:: Self Patient language and need for interpreter reviewed:: Yes Do you feel safe going back to the place where you live?: Yes        Care giver support system in place?: No (comment) Current home services: DME (walker) Criminal Activity/Legal Involvement Pertinent to Current Situation/Hospitalization: No - Comment as needed  Activities of Daily Living   ADL Screening (condition at time of admission) Independently performs ADLs?: Yes (appropriate for developmental age) Is the patient deaf or have difficulty hearing?: No Does the patient have difficulty seeing, even when wearing glasses/contacts?: No Does the patient have difficulty concentrating, remembering, or making decisions?: Yes  Permission Sought/Granted                  Emotional Assessment Appearance:: Appears stated age Attitude/Demeanor/Rapport:  Engaged Affect (typically observed): Accepting Orientation: : Oriented to Self, Oriented to Situation, Oriented to Place   Psych Involvement: No (comment)  Admission diagnosis:  Acute ischemic stroke [name]) [I63.9] Subacute osteomyelitis of right foot (HCC) [A21.308] Cerebrovascular accident (CVA), unspecified mechanism (HCC) [I63.9] Hypertension, unspecified type [I10] Patient Active Problem List   Diagnosis Date Noted   Acute ischemic stroke (HCC) 05/03/2023   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/03/2023   Chronic osteomyelitis (HCC) 05/03/2023   Hyperglycemia 03/04/2023   Acute osteomyelitis of ankle or foot, right (HCC) 03/01/2023   Lactic acidosis 03/01/2023   History of transmetatarsal amputation of right foot (HCC) 04/06/2022   Cutaneous abscess of right foot 02/04/2022   Osteomyelitis of great toe of left foot (HCC)    Hyperlipidemia 11/30/2021   Bedbug bite with infection 11/30/2021    Class: Acute   AKI (acute kidney injury) (HCC) 11/12/2021   Sepsis (HCC) 11/11/2021   Amputated great toe of right foot (HCC) 02/20/2019   Stage 3a chronic kidney disease (HCC) 02/13/2019   Acute osteomyelitis (HCC)    DM (diabetes mellitus) (HCC) 01/30/2019   Fatty liver 01/09/2019   Thrombocytopenia, unspecified (HCC) 05/14/2013   Other specified abnormal findings of blood chemistry 05/14/2013   Colon adenoma 09/13/2012   Anxiety 09/06/2012   Intellectual disability 09/06/2012   Chest pain 09/06/2012   Gastroesophageal reflux  disease 03/08/2012   Gout 09/06/2011   Hypertension 03/08/2011   Chronic kidney disease 11/30/2009   Urinary tract infection 11/30/2009   PCP:  Assunta Found, MD Pharmacy:   [facility] [phone] - [place], [place] - [address] AT [facility] [address] [place] [address] Phone: [phone] Fax: [phone]     Social Drivers of Health (SDOH) Social History: SDOH Screenings   Food Insecurity: No Food Insecurity  (05/04/2023)  Housing: Low Risk  (05/04/2023)  Transportation Needs: No Transportation Needs (05/04/2023)  Utilities: Not At Risk (05/04/2023)  Financial Resource Strain: Low Risk  (12/13/2021)  Social Connections: Socially Isolated (05/04/2023)  Tobacco Use: Low Risk  (05/03/2023)   SDOH Interventions:     Readmission Risk Interventions    11/30/2021    2:51 PM  Readmission Risk Prevention Plan  Transportation Screening Complete  HRI or Home Care Consult Complete  Social Work Consult for Recovery Care Planning/Counseling Complete  Palliative Care Screening Not Applicable  Medication Review Oceanographer) Complete

## 2023-05-04 NOTE — Progress Notes (Signed)
 Carotid Duplex has been completed.    Results can be found under chart review under CV PROC. 05/04/2023 6:16 PM Asti Mackley RVT, RDMS

## 2023-05-04 NOTE — Evaluation (Signed)
 Physical Therapy Evaluation Patient Details Name: [name] MRN: [id number] DOB: 1950-10-27 Today's Date: 05/04/2023  History of Present Illness  Pt is a 73 yo male presenting to [facility] from [facility] on 05/03/23 for evaluation of ongoing headaches. MRI revealed subacute L corona radiata micro hemorrhage. PMH of  DM, nephropathy, GERD, hypertension, hyperlipidemia, CKD stage IIIa, prior TMA of the left great toe with subsequent other surgeries, osteomyelitis of the right foot status post right TMA.   Clinical Impression  [name] is 73 y.o. male admitted with above HPI and diagnosis. Patient is currently limited by functional impairments below (see PT problem list). Patient lives alone and is independent with occasional use of RW out of home for community mobility at baseline. Currently he is mobilizing at mod ind/sup level for bed mobility and transfers and was able to amb ~300' with no AD on level surface with no overt LOB. Pt completed dual task challenges during gait and negotiated 3 stairs with hand rails and CGA/supervision. Pt has assist available from his niece. Patient will benefit from continued skilled PT interventions to address impairments and progress independence with mobility. Acute PT will follow and progress as able.         If plan is discharge home, recommend the following: Assist for transportation   Can travel by private vehicle        Equipment Recommendations None recommended by PT  Recommendations for Other Services       Functional Status Assessment Patient has had a recent decline in their functional status and demonstrates the ability to make significant improvements in function in a reasonable and predictable amount of time.     Precautions / Restrictions Precautions Precautions: Fall Recall of Precautions/Restrictions: Intact Precaution/Restrictions Comments: R TMA, open wound on bottom Restrictions Weight Bearing Restrictions Per Provider  Order: No      Mobility  Bed Mobility Overal bed mobility: Needs Assistance Bed Mobility: Supine to Sit, Sit to Supine     Supine to sit: Modified independent (Device/Increase time) Sit to supine: Modified independent (Device/Increase time)   General bed mobility comments: use of bed features and extra time    Transfers Overall transfer level: Needs assistance Equipment used: None Transfers: Sit to/from Stand Sit to Stand: Supervision           General transfer comment: use of hands for power up and to reach back for lowering    Ambulation/Gait Ambulation/Gait assistance: Contact guard assist, Supervision Gait Distance (Feet): 300 Feet Assistive device: None Gait Pattern/deviations: Step-through pattern, Decreased stride length, Drifts right/left Gait velocity: decr     General Gait Details: overall steady with slight drift when challenged with visual dual task.  Stairs Stairs: Yes Stairs assistance: Contact guard assist Stair Management: Two rails, Alternating pattern, Forwards Number of Stairs: 3 General stair comments: cues for foot placement, pt not aware of Rt foot placement and need to move forward due to TMA. no overt LOB noted.  Wheelchair Mobility     Tilt Bed    Modified Rankin (Stroke Patients Only)       Balance Overall balance assessment: Needs assistance Sitting-balance support: Feet supported Sitting balance-Leahy Scale: Good     Standing balance support: No upper extremity supported, During functional activity Standing balance-Leahy Scale: Good                               Pertinent Vitals/Pain Pain Assessment Pain Assessment:  No/denies pain    Home Living Family/patient expects to be discharged to:: Private residence Living Arrangements: Alone Available Help at Discharge: Family;Available PRN/intermittently Type of Home: House Home Access: Stairs to enter Entrance Stairs-Rails: None Entrance Stairs-Number of  Steps: 1   Home Layout: One level Home Equipment: Agricultural consultant (2 wheels);Shower seat Additional Comments: has a bulldog an dboxer, spoiled them, neice comes by after 5 pm after work every day to check on him.    Prior Function Prior Level of Function : Independent/Modified Independent             Mobility Comments: Community amb with RW, no AD in home ADLs Comments: stand to shower, and cooks on stove, his niece takes him to MD appointments and pays the bills for him     Extremity/Trunk Assessment   Upper Extremity Assessment Upper Extremity Assessment: LUE deficits/detail;Right hand dominant LUE Deficits / Details: LUE with mild coordination deficits, weaker than R but MMT 3+/5. senstion intact bilaterally    Lower Extremity Assessment Lower Extremity Assessment: RLE deficits/detail;LLE deficits/detail RLE Deficits / Details: sensation may be limited by neuropathy vs CVA RLE Coordination: WNL LLE Deficits / Details: sensation may be limited by neuropathy vs CVA LLE Coordination: WNL     Muscle bulk: normal, tone normal, pronator drift none tremor none  MMT: Mvmt Root Nerve  Muscle Right Left Comments  SA C5/6 Ax Deltoid      EF C5/6 Mc Biceps      EE C6/7/8 Rad Triceps      WF C6/7 Med FCR       WE C7/8 PIN ECU        F Ab C8/T1 U ADM/FDI      HF L1/2/3 Fem Illopsoas 5 4+    KE L2/3/4 Fem Quad  5  4+    KF L5/S1/2 Sciatic Ham 5 5   DF L4/5 D Peron Tib Ant 5 5    PF S1/2 Tibial Grc/Sol 5 5     Sensation:  Light touch Appears to be guessing for LE's below the knee, intact for bil UE   Pin prick     Temperature     Vibration    Proprioception      Coordination/Complex Motor:  - Finger to Nose: slight tremor with Lt and slightly less acurate - Heel to shin: intact bil - Rapid alternating movement:  - Gait: overall steady with slight drift when challenged with visual dual task, no ataxia.  Cervical / Trunk Assessment Cervical / Trunk Assessment: Normal   Communication   Communication Communication: Impaired Factors Affecting Communication: Reduced clarity of speech    Cognition Arousal: Alert Behavior During Therapy: WFL for tasks assessed/performed   PT - Cognitive impairments: No apparent impairments                         Following commands: Intact       Cueing Cueing Techniques: Verbal cues     General Comments General comments (skin integrity, edema, etc.): open wound on bottom of R TMA, RN notified. callused Skin discoloration on R knee and L calf from fall 3 years ago per pt report    Exercises     Assessment/Plan    PT Assessment Patient needs continued PT services  PT Problem List Decreased activity tolerance;Decreased balance;Decreased mobility;Impaired sensation;Decreased skin integrity;Decreased safety awareness;Decreased knowledge of precautions       PT Treatment Interventions DME instruction;Gait training;Stair training;Functional mobility training;Therapeutic activities;Therapeutic  exercise;Balance training;Neuromuscular re-education;Cognitive remediation;Patient/family education    PT Goals (Current goals can be found in the Care Plan section)  Acute Rehab PT Goals Patient Stated Goal: return home to dogs PT Goal Formulation: With patient Time For Goal Achievement: 05/18/23 Potential to Achieve Goals: Good    Frequency Min 2X/week     Co-evaluation               AM-PAC PT "6 Clicks" Mobility  Outcome Measure Help needed turning from your back to your side while in a flat bed without using bedrails?: None Help needed moving from lying on your back to sitting on the side of a flat bed without using bedrails?: None Help needed moving to and from a bed to a chair (including a wheelchair)?: A Little Help needed standing up from a chair using your arms (e.g., wheelchair or bedside chair)?: A Little Help needed to walk in hospital room?: A Little Help needed climbing 3-5 steps with a  railing? : A Little 6 Click Score: 20    End of Session Equipment Utilized During Treatment: Gait belt Activity Tolerance: Patient tolerated treatment well Patient left: in bed;with call bell/phone within reach;with bed alarm set Nurse Communication: Mobility status PT Visit Diagnosis: Other symptoms and signs involving the nervous system (R29.898);Difficulty in walking, not elsewhere classified (R26.2);Other abnormalities of gait and mobility (R26.89)    Time: 8416-6063 PT Time Calculation (min) (ACUTE ONLY): 27 min   Charges:   PT Evaluation $PT Eval Moderate Complexity: 1 Mod   PT General Charges $$ ACUTE PT VISIT: 1 Visit         Wynn Maudlin, DPT Acute Rehabilitation Services Office [phone]  05/04/23 10:17 AM

## 2023-05-04 NOTE — Evaluation (Signed)
 Occupational Therapy Evaluation Patient Details Name: [name] MRN: [id number] DOB: [birth year] Today's Date: 05/04/2023   History of Present Illness   Pt is a 73 yo male presenting to [facility] from [facility] on 05/03/23 for evaluation of ongoing headaches. MRI revealed subacute L corona radiata micro hemorrhage. 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 He is able to complete ADLs with CGA to setup A and was ambulatory in hall with CGA no AD. Pt with some tremors and minor coordination deficits in LUE, tremors also present in RUE, vision WFL. OT to continue following acutely to maximize pt independence for safe disposition. Patient would benefit from post acute Home OT services to help maximize functional independence in natural environment      If plan is discharge home, recommend the following:   Direct supervision/assist for financial management;Assist for transportation     Functional Status Assessment   Patient has had a recent decline in their functional status and demonstrates the ability to make significant improvements in function in a reasonable and predictable amount of time.     Equipment Recommendations   None recommended by OT     Recommendations for Other Services         Precautions/Restrictions   Precautions Precautions: Fall Recall of Precautions/Restrictions: Intact Precaution/Restrictions Comments: R TMA, open wound on bottom Restrictions Weight Bearing Restrictions Per Provider Order: No     Mobility Bed  Mobility Overal bed mobility: Needs Assistance Bed Mobility: Supine to Sit, Sit to Supine     Supine to sit: Supervision Sit to supine: Supervision        Transfers Overall transfer level: Needs assistance Equipment used: None Transfers: Sit to/from Stand Sit to Stand: Contact guard assist           General transfer comment: CGA for safety, no overt buckling      Balance Overall balance assessment: Needs assistance Sitting-balance support: No upper extremity supported, Feet supported Sitting balance-Leahy Scale: Good       Standing balance-Leahy Scale: Fair Standing balance comment: no AD dynamic balance                           ADL either performed or assessed with clinical judgement   ADL Overall ADL's : Needs assistance/impaired Eating/Feeding: Independent;Sitting   Grooming: Sitting;Set up   Upper Body Bathing: Standing;Contact guard assist   Lower Body Bathing: Set up;Sitting/lateral leans   Upper Body Dressing : Sitting;Set up   Lower Body Dressing: Sitting/lateral leans;Set up   Toilet Transfer: Contact guard assist;Ambulation   Toileting- Clothing Manipulation and Hygiene: Contact guard assist;Sit to/from stand       Functional mobility during ADLs: Contact guard assist       Vision   Additional Comments: Pt identifying number of fingers on therapists hand without challenge, Missed 2 on letter E cancellation while standing at board but pt overall doing good with scanning and checking for errors.     Perception Perception: Within Functional Limits       Praxis Praxis: [facility]  Pertinent Vitals/Pain Pain Assessment Pain Assessment: No/denies pain     Extremity/Trunk Assessment Upper Extremity Assessment Upper Extremity Assessment: LUE deficits/detail;Right hand dominant LUE Deficits / Details: LUE with mild coordination deficits, weaker than R but MMT 3+/5. senstion intact bilaterally   Lower Extremity  Assessment Lower Extremity Assessment: RLE deficits/detail;LLE deficits/detail       Communication Communication Communication: Impaired Factors Affecting Communication: Reduced clarity of speech   Cognition Arousal: Alert Behavior During Therapy: WFL for tasks assessed/performed Cognition: No family/caregiver present to determine baseline                               Following commands: Intact       Cueing  General Comments   Cueing Techniques: Verbal cues  open wound on bottom of R TMA, RN notified. callused Skin discoloration on R knee and L calf from fall 3 years ago per pt report   Exercises     Shoulder Instructions      Home Living Family/patient expects to be discharged to:: Private residence Living Arrangements: Alone Available Help at Discharge: Family Type of Home: House Home Access: Stairs to enter Secretary/administrator of Steps: 1 Entrance Stairs-Rails: None Home Layout: One level     Bathroom Shower/Tub: Chief Strategy Officer: Standard Bathroom Accessibility: Yes   Home Equipment: Agricultural consultant (2 wheels);Shower seat   Additional Comments: has a bulldog an dboxer, spoiled them, neice comes by after 5 pm after work every day to check on him.      Prior Functioning/Environment Prior Level of Function : Independent/Modified Independent             Mobility Comments: Community amb with RW, no AD in home ADLs Comments: stand to shower, and cooks on stove, his niece takes him to MD appointments and pays the bills for him    OT Problem List: Impaired balance (sitting and/or standing)   OT Treatment/Interventions: Self-care/ADL training;Balance training;Therapeutic exercise;Therapeutic activities;Patient/family education      OT Goals(Current goals can be found in the care plan section)   Acute Rehab OT Goals Patient Stated Goal: to go home OT Goal Formulation: With patient Time For Goal Achievement:  05/18/23 Potential to Achieve Goals: Good ADL Goals Pt Will Perform Grooming: Independently;standing Pt Will Perform Upper Body Bathing: Independently;standing Pt Will Perform Lower Body Dressing: Independently;sit to/from stand Pt Will Transfer to Toilet: Independently;ambulating Pt Will Perform Tub/Shower Transfer: Tub transfer;Independently   OT Frequency:  Min 2X/week    Co-evaluation              AM-PAC OT "6 Clicks" Daily Activity     Outcome Measure Help from another person eating meals?: None Help from another person taking care of personal grooming?: A Little Help from another person toileting, which includes using toliet, bedpan, or urinal?: A Little Help from another person bathing (including washing, rinsing, drying)?: A Little Help from another person to put on and taking off regular upper body clothing?: A Little Help from another person to put on and taking off regular lower body clothing?: A Little 6 Click Score: 19   End of Session Equipment Utilized During Treatment: Gait belt Nurse Communication: Mobility status;Other (comment) (Open wound on bottom of R TMA)  Activity Tolerance: Patient tolerated treatment well Patient left: in bed;with call bell/phone within reach;with bed alarm set  OT Visit Diagnosis: Unsteadiness on feet (R26.81);Cognitive communication deficit (R41.841) Symptoms and signs involving cognitive  functions: Nontraumatic intracerebral hemorrhage                Time: 0757-0824 OT Time Calculation (min): 27 min Charges:  OT General Charges $OT Visit: 1 Visit OT Evaluation $OT Eval Low Complexity: 1 Low  05/04/2023  AB, OTR/L  Acute Rehabilitation Services  Office: [phone]   Tristan Schroeder 05/04/2023, 8:50 AM

## 2023-05-04 NOTE — Progress Notes (Signed)
 STROKE TEAM PROGRESS NOTE    SIGNIFICANT HOSPITAL EVENTS  3/19: Presented to [facility] for evaluation of ongoing headaches. MRI revealed subacute left corona radiata microhemorrhage.  Transferred to [facility] for full stroke workup.  INTERIM HISTORY/SUBJECTIVE  On exam today, patient is oriented, has mild to moderate dysarthria, follows commands, no drift seen in any extremity.  At baseline, patient occasionally uses rolling walker.  He does live alone but has a niece that comes and checks on him every day after work.  Pending full stroke workup.  OBJECTIVE  CBC    Component Value Date/Time   WBC 6.4 05/04/2023 1036   RBC 4.27 05/04/2023 1036   HGB 12.7 (L) 05/04/2023 1036   HCT 37.1 (L) 05/04/2023 1036   PLT 171 05/04/2023 1036   MCV 86.9 05/04/2023 1036   MCH 29.7 05/04/2023 1036   MCHC 34.2 05/04/2023 1036   RDW 14.1 05/04/2023 1036   LYMPHSABS 0.9 05/04/2023 1036   MONOABS 0.7 05/04/2023 1036   EOSABS 0.1 05/04/2023 1036   BASOSABS 0.1 05/04/2023 1036    BMET    Component Value Date/Time   NA 135 05/04/2023 1036   K 4.4 05/04/2023 1036   CL 102 05/04/2023 1036   CO2 20 (L) 05/04/2023 1036   GLUCOSE 232 (H) 05/04/2023 1036   BUN 25 (H) 05/04/2023 1036   CREATININE 1.65 (H) 05/04/2023 1036   CREATININE 1.43 (H) 01/09/2019 1019   CALCIUM 8.4 (L) 05/04/2023 1036   GFRNONAA 44 (L) 05/04/2023 1036   GFRNONAA 50 (L) 01/09/2019 1019    IMAGING past 24 hours No results found.  Vitals:   05/04/23 0018 05/04/23 0357 05/04/23 0828 05/04/23 1148  BP: (!) 148/84 (!) 115/54 115/62 122/67  Pulse: 91 88 95 83  Resp:   17 18  Temp: 98.1 F (36.7 C) 98.4 F (36.9 C) 97.6 F (36.4 C) 97.9 F (36.6 C)  TempSrc: Oral Oral Oral Oral  SpO2: 96% 96% 100% 97%  Weight:      Height:       PHYSICAL EXAM General:  Alert, well-developed, somewhat disheveled patient in no acute distress Psych:  Mood and affect appropriate for situation CV: Regular rate and rhythm on  monitor Respiratory:  Regular, unlabored respirations on room air  NEURO:  Mental Status: AA&Ox3, patient is able to give clear and coherent history Speech/Language: Moderate to severe dysarthria (edentulous), no aphasia.  Naming, repetition, fluency, and comprehension intact.   Cranial Nerves:  II: PERRL. Visual fields full.  III, IV, VI: EOMI. Eyelids elevate symmetrically.  V: Sensation is intact to light touch and symmetrical to face.  VII: Face is symmetrical resting and smiling VIII: hearing intact to voice. IX, X: Mild dysarthria WU:[id number] shrug 5/5. XII: tongue is midline without fasciculations. Motor: 5/5 strength to all muscle groups tested.  Tone: is normal and bulk is normal Sensation- Intact to light touch bilaterally. Extinction absent to light touch to DSS.   Coordination: FTN intact bilaterally, HKS: no ataxia in BLE.No drift.  Gait- deferred  Most Recent NIH:2   ASSESSMENT/PLAN  [name] is a 73 y.o. male with history of DM2 with neuropathy, HTN, HLD, CKD 3A, GERD, osteomyelitis of right foot s/p right TMA, prior TMA of left great toe with subsequent other surgeries who presented to [facility] for evaluation persistent headaches x 1 week in the setting of severely elevated blood pressures and multiple cerebrovascular risk factors, severely hypertensive in ED.  He was transferred and admitted to  [facility] for stroke workup after MRI revealed small area of acute to subacute microhemorrhage in the left frontal area, primary microhemorrhages versus hemorrhagic transformation of ischemic infarct.  NIH on Admission: 2.  Tiny acute to subacute left BG ICH, likely from small vessel disease CT head 5 mm indeterminate hyperdensity in the anterior left corona radiata-small vessel related microhemorrhage versus dystrophic calcification or chronic blood products.  MRI with and without contrast Constellation of signal abnormality in the left corona radiata at the CT  abnormalities most consistent with an acute to subacute microhemorrhage. Mild surrounding edema but no mass effect.  MRA unremarkable CT repeat pending Carotid Doppler bilateral ICA 60 to 79% stenosis 2D Echo EF 55 to 60% LDL: 72 HgbA1c 8.1 UDS pending VTE prophylaxis - SCDs No antithrombotic prior to admission, now on No antithrombotic due to ICH Therapy recommendations:  Home Health PT and Home Health OT Disposition:  likely home tomorrow  Carotid stenosis Carotid Doppler bilateral ICA 60 to 79% stenosis May consider CTA head and neck once Cre improves Need to follow-up with vascular surgery as outpatient for more during  Hypertension Home meds:  hydralazine 25mg , metoprolol 100mg  Stable On home metoprolol and hydralazine Long-term BP goal normotensive  Hyperlipidemia Home meds:  Lipitor 10mg  LDL 72, goal < 70 Continue Lipitor 10 Continue statin on discharge  Diabetes type II Uncontrolled Home meds:  glipizide  HgbA1c 8.1, goal < 7.0 CBGs SSI Recommend close follow-up with PCP for better DM control  Other acute issues CKD 3A, creatinine 1.25--1.65  Hospital day # 1  Marvel Plan, MD PhD Stroke Neurology 05/04/2023 7:35 PM    To contact Stroke Continuity provider, please refer to [email or web address]. After hours, contact General Neurology

## 2023-05-04 NOTE — Progress Notes (Signed)
 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  Further workup was done and his head CT showed subacute microhemorrhage stroke and case was discussed with neurology who recommended transferring the patient to [facility].  He is undergoing stroke workup and currently his echocardiogram, repeat head CT and carotid Dopplers are still pending.  Neuro feels that he is stable from their standpoint and PT OT recommending home health once he is medically cleared.  Assessment and Plan:  Hemorrhagic stroke (HCC) -No edema or midline shifting appreciated -Neurology service consulted and will follow patient once he arrived to [facility] -Repeat CT scan is still pending -Continue blood pressure control with goal for systolic blood pressure less than 160, -SCDs for DVT prophylaxis at the moment -No large vessel occlusion appreciated; will check 2D echo still pending -Lipid panel done Show cholesterol/HDL ratio 6.1, cholesterol level 176, HDL 29, LDL 72, triglycerides 377 and VLDL 75; hemoglobin A1c still was 8.1 -Providing as needed analgesics and Tylenol as patient was complaining of headaches. -Neurostroke team consulted and checking carotid Dopplers and recommending CT scan which is still pending; echocardiogram done and showed an EF of 55 to 60% and carotid Dopplers were done showed: "Velocities in the right ICA are consistent with a 60-79%                 stenosis. The ECA appears >50%  stenosed.   Left Carotid: Velocities in the left ICA are consistent with a 60-79%  stenosis.               The ECA appears >50% stenosed." -PT OT recommending home health.   Hypertension -Uncontrolled -Resuming metoprolol and adjusting dose of hydralazine -As needed labetalol will be provided to achieve recommended goals by neurology service. -At time of admission patient experiencing hypertensive emergency.  -Follow vital signs closely.   Gout: No acute flare. Continue her Boxell stat 80 mg p.o. daily.   Gastroesophageal reflux disease GI prophylaxis -Continue PPI.   DM (diabetes mellitus) type II (HCC) -Poorly controlled and with hyperglycemia; at baseline with chronic kidney disease stage IIIa -Follow CBGs fluctuation and adjust hypoglycemic regimen as needed -Sliding scale insulin and Semglee has been started.  He is now on 20 units subcu nightly and sensitive NovoLog/scale insulin ACHS.  CBGs ranging from 211-260   Stage 3a chronic kidney disease (HCC) metabolic acidosis -Appears to be stable and at baseline -BUN/Cr Trend: Recent Labs  Lab 05/03/23 0900 05/04/23 1036  BUN 24* 25*  CREATININE 1.25* 1.65*  -Has a slight metabolic acidosis with a CO2 of 20, anion gap of 13, chloride level of 102 -Avoid Nephrotoxic Medications, Contrast Dyes, Hypotension and Dehydration to Ensure Adequate Renal Perfusion and will need to Renally Adjust Meds -Continue to Monitor and Trend Renal Function carefully and repeat CMP in the AM    Hyperlipidemia: Lipid panel as a.  Continue with atorvastatin 10 mg p.o. nightly.  Normocytic Anemia: Patient's hemoglobin/medic is now 12.7/37.1.  Check anemia panel in AM.  Continue monitor for signs of symptomatic;  no overt bleeding noted -Repeat CBC in a.m.   Chronic osteomyelitis (HCC) -continue outpatient follow up with Dr. Lajoyce Corners -appears stable and currently not demonstrating worsening infection. -patient is afebrile and with normal WBC's    Depression/Anxiety: Continue with bupropion 150 mg p.o. nightly   DVT prophylaxis: SCD's Start: 05/03/23 1404    Code Status: Full Code Family Communication: Family currently at bedside  Disposition Plan:  Level of care: Progressive Status is: Inpatient Remains inpatient appropriate because: Needs further completion of stroke workup and clearance by neurology   Consultants:  Neurology  Procedures:  As delineated as above  Antimicrobials:  Anti-infectives (From admission, onward)    None       Subjective: Seen and admitted bedside was doing okay.  Denied any complaints and states his headache is getting better.  No nausea or vomiting.  No other concerns requested this time.  Objective: Vitals:   05/04/23 0357 05/04/23 0828 05/04/23 1148 05/04/23 1656  BP: (!) 115/54 115/62 122/67 137/64  Pulse: 88 95 83 80  Resp:  17 18   Temp: 98.4 F (36.9 C) 97.6 F (36.4 C) 97.9 F (36.6 C) 98.3 F (36.8 C)  TempSrc: Oral Oral Oral Oral  SpO2: 96% 100% 97% 97%  Weight:      Height:        Intake/Output Summary (Last 24 hours) at 05/04/2023 1901 Last data filed at 05/04/2023 1400 Gross per 24 hour  Intake 480 ml  Output 300 ml  Net 180 ml   Filed Weights   05/03/23 0829  Weight: 90.7 kg   Examination: Physical Exam:  Constitutional: Chronically appearing slightly disheveled Caucasian male in no acute distress Respiratory: Diminished to auscultation bilaterally, no wheezing, rales, rhonchi or crackles. Normal respiratory effort and patient is not tachypenic. No accessory muscle use.  Unlabored breathing Cardiovascular: RRR, no murmurs / rubs / gallops. S1 and S2 auscultated. No extremity edema.  Abdomen: Soft, non-tender, non-distended.  Bowel sounds positive.  GU: Deferred. Neurologic: CN 2-12 grossly intact with no focal deficits.  Romberg sign cerebellar reflexes not assessed.  Psychiatric: Normal judgment and insight. Alert and oriented x 3.   Data Reviewed: I  have personally reviewed following labs and imaging studies  CBC: Recent Labs  Lab 05/03/23 0900 05/04/23 1036  WBC 6.2 6.4  NEUTROABS 4.6 4.6  HGB 13.4 12.7*  HCT 40.6 37.1*  MCV 89.2 86.9  PLT 164 171   Basic Metabolic Panel: Recent Labs  Lab 05/03/23 0900 05/04/23 1036  NA 135 135  K 3.8 4.4  CL 101 102  CO2 25 20*  GLUCOSE 268* 232*  BUN 24* 25*  CREATININE 1.25* 1.65*  CALCIUM 8.4* 8.4*  MG  --  1.8  PHOS  --  3.9   GFR: Estimated Creatinine Clearance: 51.8 mL/min (A) (by C-G formula based on SCr of 1.65 mg/dL (H)). Liver Function Tests: Recent Labs  Lab 05/03/23 0900 05/04/23 1036  AST 19 24  ALT 18 18  ALKPHOS 67 41  BILITOT 0.4 1.3*  PROT 6.7 6.0*  ALBUMIN 3.5 3.2*   No results for input(s): "LIPASE", "AMYLASE" in the last 168 hours. No results for input(s): "AMMONIA" in the last 168 hours. Coagulation Profile: Recent Labs  Lab 05/03/23 0900  INR 1.0   Cardiac Enzymes: No results for input(s): "CKTOTAL", "CKMB", "CKMBINDEX", "TROPONINI" in the last 168 hours. BNP (last 3 results) No results for input(s): "PROBNP" in the last 8760 hours. HbA1C: Recent Labs    05/03/23 0900  HGBA1C 8.1*   CBG: Recent Labs  Lab 05/03/23 1648 05/03/23 2222 05/04/23 0612 05/04/23 1149 05/04/23 1657  GLUCAP 175* 228* 211* 260* 248*   Lipid Profile: Recent Labs    05/04/23 0702  CHOL 176  HDL 29*  LDLCALC 72  TRIG 782*  CHOLHDL 6.1   Thyroid Function Tests: No results for input(s): "TSH", "T4TOTAL", "FREET4", "T3FREE", "THYROIDAB" in the last 72 hours. Anemia Panel: No results for input(s): "VITAMINB12", "FOLATE", "FERRITIN", "TIBC", "IRON", "RETICCTPCT" in the last 72 hours. Sepsis Labs: No results for input(s): "PROCALCITON", "LATICACIDVEN" in the last 168 hours.  Recent Results (from the past 240 hours)  C Difficile Quick Screen w PCR reflex     Status: None   Collection Time: 05/04/23  8:51 AM   Specimen: STOOL  Result Value Ref Range  Status   C Diff antigen NEGATIVE NEGATIVE Final   C Diff toxin NEGATIVE NEGATIVE Final   C Diff interpretation No C. difficile detected.  Final    Comment: Performed at [facility], [address] [place], [place] [address]     Radiology Studies: ECHOCARDIOGRAM COMPLETE Result Date: 05/04/2023    ECHOCARDIOGRAM REPORT   Patient Name:   [name] Date of Exam: 05/04/2023 Medical Rec #:  [id number]        Height:       77.0 in Accession #:    [id number]       Weight:       200.0 lb Date of Birth:  1950-07-14        BSA:          2.237 m Patient Age:    72 years         BP:           122/67 mmHg Patient Gender: M                HR:           83 bpm. Exam Location:  Inpatient Procedure: 2D Echo, Cardiac Doppler and Color Doppler (Both Spectral and Color            Flow Doppler were utilized during procedure). Indications:    Stroke  History:        Patient has prior history of Echocardiogram examinations, most                 recent 02/28/2022. Risk Factors:Hypertension and Diabetes.  Sonographer:    Amy Chionchio Referring Phys: Vassie Loll IMPRESSIONS  1. Left ventricular ejection fraction, by estimation, is 55 to 60%. The left ventricle has normal function. The left ventricle has no regional wall motion abnormalities. Left ventricular diastolic parameters were normal.  2. Right ventricular systolic function is normal. The right ventricular size is normal. Tricuspid regurgitation signal is inadequate for assessing PA pressure.  3. The mitral valve is grossly normal. No evidence of mitral valve regurgitation. No evidence of mitral stenosis.  4. The aortic valve is tricuspid. Aortic valve regurgitation is not visualized. Aortic valve sclerosis is present, with no evidence of aortic valve stenosis.  5. The inferior vena cava is normal in size with greater than 50% respiratory variability, suggesting right atrial pressure of 3 mmHg. Conclusion(s)/Recommendation(s): No intracardiac source of embolism  detected on this transthoracic study. Consider a transesophageal echocardiogram to exclude cardiac source of embolism if clinically indicated. FINDINGS  Left Ventricle: Left ventricular ejection fraction, by estimation, is 55 to 60%. The left ventricle has normal function. The left ventricle  has no regional wall motion abnormalities. The left ventricular internal cavity size was normal in size. There is  no left ventricular hypertrophy. Left ventricular diastolic parameters were normal. Right Ventricle: The right ventricular size is normal. No increase in right ventricular wall thickness. Right ventricular systolic function is normal. Tricuspid regurgitation signal is inadequate for assessing PA pressure. Left Atrium: Left atrial size was normal in size. Right Atrium: Right atrial size was normal in size. Pericardium: There is no evidence of pericardial effusion. Mitral Valve: The mitral valve is grossly normal. No evidence of mitral valve regurgitation. No evidence of mitral valve stenosis. Tricuspid Valve: The tricuspid valve is normal in structure. Tricuspid valve regurgitation is not demonstrated. No evidence of tricuspid stenosis. Aortic Valve: The aortic valve is tricuspid. Aortic valve regurgitation is not visualized. Aortic valve sclerosis is present, with no evidence of aortic valve stenosis. Aortic valve mean gradient measures 5.0 mmHg. Aortic valve peak gradient measures 8.1  mmHg. Aortic valve area, by VTI measures 2.22 cm. Pulmonic Valve: The pulmonic valve was grossly normal. Pulmonic valve regurgitation is not visualized. No evidence of pulmonic stenosis. Aorta: The aortic root and ascending aorta are structurally normal, with no evidence of dilitation. Venous: The inferior vena cava is normal in size with greater than 50% respiratory variability, suggesting right atrial pressure of 3 mmHg. IAS/Shunts: No atrial level shunt detected by color flow Doppler.  LEFT VENTRICLE PLAX 2D LVIDd:         4.40  cm LVIDs:         3.20 cm LV PW:         0.90 cm LV IVS:        1.00 cm LVOT diam:     2.00 cm LV SV:         58 LV SV Index:   26 LVOT Area:     3.14 cm  LV Volumes (MOD) LV vol d, MOD A2C: 64.6 ml LV vol d, MOD A4C: 64.6 ml LV vol s, MOD A2C: 18.3 ml LV vol s, MOD A4C: 23.4 ml LV SV MOD A2C:     46.3 ml LV SV MOD A4C:     64.6 ml LV SV MOD BP:      47.9 ml RIGHT VENTRICLE          IVC RV Basal diam:  3.30 cm  IVC diam: 1.70 cm TAPSE (M-mode): 1.6 cm LEFT ATRIUM             Index        RIGHT ATRIUM           Index LA Vol (A2C):   52.6 ml 23.52 ml/m  RA Area:     17.80 cm LA Vol (A4C):   56.6 ml 25.30 ml/m  RA Volume:   41.70 ml  18.64 ml/m LA Biplane Vol: 56.5 ml 25.26 ml/m  AORTIC VALVE                     PULMONIC VALVE AV Area (Vmax):    2.48 cm      PV Vmax:       0.90 m/s AV Area (Vmean):   2.19 cm      PV Peak grad:  3.2 mmHg AV Area (VTI):     2.22 cm AV Vmax:           142.00 cm/s AV Vmean:          110.000 cm/s AV VTI:  0.260 m AV Peak Grad:      8.1 mmHg AV Mean Grad:      5.0 mmHg LVOT Vmax:         112.00 cm/s LVOT Vmean:        76.800 cm/s LVOT VTI:          0.184 m LVOT/AV VTI ratio: 0.71  AORTA Ao Root diam: 3.30 cm Ao Asc diam:  3.40 cm  SHUNTS Systemic VTI:  0.18 m Systemic Diam: 2.00 cm Sunit Tolia Electronically signed by Tessa Lerner Signature Date/Time: 05/04/2023/6:57:01 PM    Final    VAS US CAROTID Result Date: 05/04/2023 Carotid Arterial Duplex Study Patient Name:  [name]  Date of Exam:   05/04/2023 Medical Rec #: [id number]         Accession #:    [id number] Date of Birth: 10-02-1950         Patient Gender: M Patient Age:   41 years Exam Location:  [facility] Procedure:      VAS US CAROTID Referring Phys: Marvel Plan --------------------------------------------------------------------------------  Indications:       CVA. Risk Factors:      Hypertension, hyperlipidemia, Diabetes, no history of                    smoking. Other Factors:     CKD.  Limitations        Today's exam was limited due to the body habitus of the                    patient, the patient's respiratory variation and facial hair                    (beard). Comparison Study:  No previous exams Performing Technologist: Jody Hill RVT, RDMS  Examination Guidelines: A complete evaluation includes B-mode imaging, spectral Doppler, color Doppler, and power Doppler as needed of all accessible portions of each vessel. Bilateral testing is considered an integral part of a complete examination. Limited examinations for reoccurring indications may be performed as noted.  Right Carotid Findings: +----------+--------+--------+--------+------------------+------------------+           PSV cm/sEDV cm/sStenosisPlaque DescriptionComments           +----------+--------+--------+--------+------------------+------------------+ CCA Prox  65      12                                                   +----------+--------+--------+--------+------------------+------------------+ CCA Distal61      10                                                   +----------+--------+--------+--------+------------------+------------------+ ICA Prox  220     52      60-79%  hypoechoic                           +----------+--------+--------+--------+------------------+------------------+ ICA Mid   232     50                                                   +----------+--------+--------+--------+------------------+------------------+  ICA Distal83      24                                                   +----------+--------+--------+--------+------------------+------------------+ ECA       279     14      >50%    calcific          calcific shadowing +----------+--------+--------+--------+------------------+------------------+ +----------+--------+-------+----------------+-------------------+           PSV cm/sEDV cmsDescribe        Arm Pressure (mmHG)  +----------+--------+-------+----------------+-------------------+ [id number]            Multiphasic, WNL                    +----------+--------+-------+----------------+-------------------+ +---------+--------+--+--------+--+---------+ VertebralPSV cm/s43EDV cm/s11Antegrade +---------+--------+--+--------+--+---------+  Left Carotid Findings: +----------+--------+--------+--------+---------------------+------------------+           PSV cm/sEDV cm/sStenosisPlaque Description   Comments           +----------+--------+--------+--------+---------------------+------------------+ CCA Prox  80      15                                                      +----------+--------+--------+--------+---------------------+------------------+ CCA Distal75      17                                   intimal thickening +----------+--------+--------+--------+---------------------+------------------+ ICA Prox  226     41      60-79%  calcific and         calcific shadowing                                   heterogenous                            +----------+--------+--------+--------+---------------------+------------------+ ICA Mid   140     25                                                      +----------+--------+--------+--------+---------------------+------------------+ ICA Distal91      26                                                      +----------+--------+--------+--------+---------------------+------------------+ ECA       332     14      >50%    diffuse, calcific and                                                     heterogenous                            +----------+--------+--------+--------+---------------------+------------------+ +----------+--------+--------+----------------+-------------------+  PSV cm/sEDV cm/sDescribe        Arm Pressure (mmHG) +----------+--------+--------+----------------+-------------------+  [id number]             Multiphasic, WNL                    +----------+--------+--------+----------------+-------------------+ +---------+--------+--+--------+--+---------+ VertebralPSV cm/s45EDV cm/s14Antegrade +---------+--------+--+--------+--+---------+   Summary: Right Carotid: Velocities in the right ICA are consistent with a 60-79%                stenosis. The ECA appears >50% stenosed. Left Carotid: Velocities in the left ICA are consistent with a 60-79% stenosis.               The ECA appears >50% stenosed. Vertebrals:  Bilateral vertebral arteries demonstrate antegrade flow. Subclavians: Normal flow hemodynamics were seen in bilateral subclavian              arteries. *See table(s) above for measurements and observations.     Preliminary    MR BRAIN W CONTRAST Result Date: 05/03/2023 CLINICAL DATA:  Headache. EXAM: MRI HEAD WITH CONTRAST TECHNIQUE: Multiplanar, multiecho pulse sequences of the brain and surrounding structures were obtained with intravenous contrast. CONTRAST:  9mL GADAVIST GADOBUTROL 1 MMOL/ML IV SOLN COMPARISON:  Noncontrast head MRI and CT earlier today FINDINGS: Faint, non-masslike enhancement is present in the left corona radiata at the site of the signal abnormality detailed on today's earlier noncontrast MRI. No abnormal intracranial enhancement is identified elsewhere. Normal vascular enhancement is present. IMPRESSION: Faint, non-masslike enhancement in the left corona radiata at the site of the suspected recent microhemorrhage on today's earlier noncontrast MRI. This has a benign appearance and is favored to reflect mild blood-brain barrier breakdown related to the hemorrhage and possibly an associated small surrounding subacute infarct. A follow-up MRI may be worthwhile in 2-3 months to ensure resolution. Electronically Signed   By: Sebastian Ache M.D.   On: 05/03/2023 13:46   MR FOOT RIGHT WO CONTRAST Result Date: 05/03/2023 CLINICAL DATA:  Concern for right  foot osteomyelitis.  Infection. EXAM: MRI OF THE RIGHT FOREFOOT WITHOUT CONTRAST TECHNIQUE: Multiplanar, multisequence MR imaging of the right foot was performed. No intravenous contrast was administered. COMPARISON:  Right foot radiographs 03/01/2023, MRI right foot 03/01/2023 FINDINGS: Bones/Joint/Cartilage Postsurgical changes are again seen of amputation of the first through fifth metatarsals through the mid shafts, not significantly changed from 02/27/2023. There is mild interval decrease in size of the transverse dimension of marrow edema surrounding the previously described chronic defect/channel within the distal, mid transverse aspect of the first metatarsal amputation margin. Overall this marrow edema measures up to approximately 6 x 14 x 7 mm (transverse by long axis of the foot by short axis of the foot, coronal series 8, image 8 and sagittal series 4, image 18) compared to 8 x 15 x 8 mm on 03/01/2023. The borders of the marrow edema or now more well-defined. This may indicate minimal interval decrease in the prior osteomyelitis/healing of the bone. There is resolution of the prior patchy marrow edema within the lateral navicular. Near resolution of the prior marrow edema within the medial cuneiform a, now with minimal residual marrow edema seen at the distal lateral aspect (coronal series 8, image 10). The marrow edema within the lateral base of the fourth metatarsal also appears to have resolved. Moderate fourth tarsometatarsal cartilage thinning with mild peripheral osteophytosis. Mild third and fifth tarsometatarsal peripheral degenerative spurring. Moderate-to-large plantar calcaneal heel spur and posterior calcaneal heel spur at the plantar fascia  and Achilles tendon insertions, respectively. Ligaments Muscles and Tendons Unchanged moderate chronic thickening at the distal Achilles tendon insertionw. Mild edema and high-grade fatty infiltration of the regional musculature, unchanged from prior.  Soft tissues There is a small region of blooming artifact related to air within a soft tissue ulcer plantar to the distal aspect of the first metatarsal amputation stump, likely slightly decreased in size from prior. This now appears to measure up to 10 mm along the longitudinal dimension of the foot compared to 17 mm previously (sagittal series 4, image 16). IMPRESSION: Compared to 03/01/2023: 1. 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 2. Slight interval decrease in size of the soft tissue ulcer distal and plantar to the first metatarsal amputation site. 3. Minimal interval decrease in size of predominantly the transverse dimension of marrow edema surrounding the previously described chronic defect/channel within the distal, mid transverse aspect of the first metatarsal amputation margin. The borders of the marrow edema are now more well-defined. This may indicate minimal interval decrease in the prior osteomyelitis and mild interval healing of the bone. No worsening or new areas concerning for acute osteomyelitis. 4. Resolution of the prior patchy marrow edema within the lateral navicular. Near resolution of the prior marrow edema within the medial cuneiform, now with minimal residual marrow edema seen at the distal lateral aspect. The marrow edema within the lateral base of the fourth metatarsal also appears to have resolved. 5. Moderate fourth tarsometatarsal osteoarthritis. Electronically Signed   By: Neita Garnet M.D.   On: 05/03/2023 13:14   DG Chest Portable 1 View Result Date: 05/03/2023 CLINICAL DATA:  73 year old male with acute headache, altered mental status. Hypertensive, systolic 220. EXAM: PORTABLE CHEST 1 VIEW COMPARISON:  Portable chest 03/25/2023. FINDINGS: Portable AP upright view at 1017 hours. Stable cardiac size at the upper limits of normal. Other mediastinal contours are within normal  limits. Visualized tracheal air column is within normal limits. Allowing for portable technique the lungs are clear. No pneumothorax or pleural effusion. No acute osseous abnormality identified. Paucity of bowel gas. IMPRESSION: No acute cardiopulmonary abnormality. Electronically Signed   By: Odessa Fleming M.D.   On: 05/03/2023 10:57   MR ANGIO HEAD WO CONTRAST Result Date: 05/03/2023 CLINICAL DATA:  73 year old male neurologic deficit. New onset headache with 5 mm left corona radiata microhemorrhage. EXAM: MRA HEAD WITHOUT CONTRAST TECHNIQUE: Angiographic images of the Circle of Willis were acquired using MRA technique without intravenous contrast. COMPARISON:  Head CT, MRI today reported separately. FINDINGS: Anterior circulation: Antegrade flow in both ICA siphons. Bilateral siphon irregularity in keeping with atherosclerosis. No significant siphon stenosis. Ophthalmic artery origins appear within normal limits. Patent carotid termini. Normal MCA and ACA origins. Tortuous A1 segments. Diminutive or absent anterior communicating artery. Mildly dominant left ACA. Visible ACA branches are within normal limits. Bilateral MCA M1 segments and MCA trifurcations appear patent without stenosis. Visible bilateral MCA branches are within normal limits. Posterior circulation: Antegrade flow in the distal vertebral arteries which appear codominant. Patent PICA origins. Patent vertebrobasilar junction and basilar artery without evidence of stenosis. Normal SCA and patent PCA origins. Posterior communicating arteries are diminutive or absent. Mild PCA tortuosity and irregularity, no significant stenosis. Anatomic variants: None. Other: Left corona radiata area of the suspected microhemorrhage by CT and MRI is on series 18, image 3 with no evidence of abnormal vascularity. IMPRESSION: 1. Intracranial MRA negative aside from evidence of intracranial atherosclerosis.  No hemodynamically significant stenosis. 2. Abnormal Brain MRI  today reported separately. Electronically Signed   By: Odessa Fleming M.D.   On: 05/03/2023 10:56   MR BRAIN WO CONTRAST Result Date: 05/03/2023 CLINICAL DATA:  73 year old male neurologic deficit. New onset headache with indeterminate 5 mm left corona radiata hyperdensity on head CT. EXAM: MRI HEAD WITHOUT CONTRAST TECHNIQUE: Multiplanar, multiecho pulse sequences of the brain and surrounding structures were obtained without intravenous contrast. COMPARISON:  Head CT 0852 hours today. FINDINGS: Brain: Left corona radiata area of concern based on earlier head CT demonstrates confluent 14 mm area of T2 and FLAIR hyperintensity with T2 shine through on DWI (series 5, image 25) and subtle susceptibility on SWI (series 13, image 38). No regional mass effect. Other scattered white matter T2 and FLAIR hyperintensity although none as confluent as that in the left corona radiata. No restricted diffusion to suggest acute infarction. No midline shift, mass effect, evidence of mass lesion, ventriculomegaly, extra-axial collection. No cortical encephalomalacia identified. No other cerebral blood products identified on SWI. Deep gray nuclei, and brainstem remain within normal limits. Cervicomedullary junction and pituitary are within normal limits. Possible small chronic linear lacunar infarct in the left lateral cerebellum series 10, image 5. Vascular: Major intracranial vascular flow voids are preserved. Skull and upper cervical spine: Visualized bone marrow signal is within normal limits. Negative for age visible cervical spine. Sinuses/Orbits: Negative orbits. Paranasal Visualized paranasal sinuses and mastoids are stable and well aerated. Other: Visible internal auditory structures appear normal. IMPRESSION: 1. Constellation of signal abnormality in the left corona radiata at the CT abnormality is most consistent with an Acute or Subacute Microhemorrhage. Mild surrounding edema but no mass effect or other complicating features.  2. Negative for superimposed acute infarct and otherwise mild for age signal changes of chronic small vessel disease. 3. MRA reported separately. Electronically Signed   By: Odessa Fleming M.D.   On: 05/03/2023 10:52   CT HEAD WO CONTRAST Result Date: 05/03/2023 CLINICAL DATA:  73 year old male with new onset headache. EXAM: CT HEAD WITHOUT CONTRAST TECHNIQUE: Contiguous axial images were obtained from the base of the skull through the vertex without intravenous contrast. RADIATION DOSE REDUCTION: This exam was performed according to the departmental dose-optimization program which includes automated exposure control, adjustment of the mA and/or kV according to patient size and/or use of iterative reconstruction technique. COMPARISON:  Head CT 04/19/2011. FINDINGS: Brain: Cerebral volume remains normal for age. Incidental choroid plexus cysts, normal variant. No midline shift, ventriculomegaly, mass effect, or evidence of cortically based acute infarction. Increased mild to moderate for age scattered and mostly periventricular white matter hypodensity. Superimposed small slightly round and amorphous 5 mm hyperdensity in the left anterior corona radiata (series 2, image 19 and series 4, image 33. This is not definitely calcified (Hounsfield units up to 90) Regional hypodensity but no regional mass effect. No other evidence of intracranial hemorrhage. And elsewhere gray-white differentiation is maintained. Vascular: Calcified atherosclerosis at the skull base. No suspicious intracranial vascular hyperdensity. Skull: Stable and intact. Sinuses/Orbits: Visualized paranasal sinuses and mastoids are clear. Other: Visualized orbits and scalp soft tissues are within normal limits. IMPRESSION: 1. Progressed cerebral white matter disease since 2013 with a 5 mm indeterminate hyperdensity in the anterior left corona radiata. Differential considerations include an Acute small-vessel related micro-Hemorrhage versus dystrophic  calcification, or chronic blood products. No associated intracranial mass effect. Follow-up Brain MRI (without and with contrast preferred) might characterize further. 2. No other acute intracranial abnormality by  CT. Electronically Signed   By: Odessa Fleming M.D.   On: 05/03/2023 09:07   Scheduled Meds:  atorvastatin  10 mg Oral QHS   buPROPion  150 mg Oral QHS   cholecalciferol  2,000 Units Oral Daily   febuxostat  80 mg Oral Daily   feeding supplement  237 mL Oral BID BM   ferrous sulfate  325 mg Oral Q breakfast   hydrALAZINE  25 mg Oral Q8H   insulin aspart  0-5 Units Subcutaneous QHS   insulin aspart  0-9 Units Subcutaneous TID WC   insulin glargine  20 Units Subcutaneous QHS   metoprolol succinate  100 mg Oral Daily   pantoprazole  40 mg Oral Daily   saccharomyces boulardii  250 mg Oral BID   sertraline  100 mg Oral QHS   sodium bicarbonate  650 mg Oral BID   Continuous Infusions:   LOS: 1 day   Marguerita Merles, DO [facility] Available via Epic secure chat 7am-7pm After these hours, please refer to coverage provider listed on [email or web address] 05/04/2023, 7:01 PM

## 2023-05-04 NOTE — Hospital Course (Addendum)
 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  Further workup was done and his head CT showed subacute microhemorrhage stroke and case was discussed with neurology who recommended transferring the patient to [facility].  He is undergoing stroke workup and currently his echocardiogram, repeat head CT and carotid Dopplers are still pending.  Neuro feels that he is stable from their standpoint and PT OT recommending home health once he is medically cleared.  Assessment and Plan:  Hemorrhagic stroke (HCC) -No edema or midline shifting appreciated -Neurology service consulted and will follow patient once he arrived to [facility] -Repeat CT scan is still pending -Continue blood pressure control with goal for systolic blood pressure less than 160, -SCDs for DVT prophylaxis at the moment -No large vessel occlusion appreciated; will check 2D echo still pending -Lipid panel done Show cholesterol/HDL ratio 6.1, cholesterol level 176, HDL 29, LDL 72, triglycerides 377 and VLDL 75; hemoglobin A1c still was 8.1 -Providing as needed analgesics and Tylenol as patient was complaining of headaches. -Neurostroke team consulted and checking carotid Dopplers and recommending CT scan which is still pending; echocardiogram done and showed an EF of 55 to 60% and carotid Dopplers were done showed: "Velocities in the right ICA are consistent with a 60-79%                 stenosis. The ECA appears >50% stenosed.   Left Carotid: Velocities in the left ICA are consistent with a 60-79%  stenosis.               The ECA appears >50%  stenosed." -PT OT recommending home health.   Hypertension -Uncontrolled -Resuming metoprolol and adjusting dose of hydralazine -As needed labetalol will be provided to achieve recommended goals by neurology service. -At time of admission patient experiencing hypertensive emergency.  -Follow vital signs closely.   Gout: No acute flare. Continue her Boxell stat 80 mg p.o. daily.   Gastroesophageal reflux disease GI prophylaxis -Continue PPI.   DM (diabetes mellitus) type II (HCC) -Poorly controlled and with hyperglycemia; at baseline with chronic kidney disease stage IIIa -Follow CBGs fluctuation and adjust hypoglycemic regimen as needed -Sliding scale insulin and Semglee has been started.  He is now on 20 units subcu nightly and sensitive NovoLog/scale insulin ACHS.  CBGs ranging from 211-260   Stage 3a chronic kidney disease (HCC) metabolic acidosis -Appears to be stable and at baseline -BUN/Cr Trend: Recent Labs  Lab 05/03/23 0900 05/04/23 1036 05/05/23 0534  BUN 24* 25* 33*  CREATININE 1.25* 1.65* 1.67*  -Has a slight metabolic acidosis with a CO2 of 20, anion gap of 13, chloride level of 102 -Avoid Nephrotoxic Medications, Contrast Dyes, Hypotension and Dehydration to Ensure Adequate Renal Perfusion and will need to Renally Adjust Meds -Continue to Monitor and Trend Renal Function carefully and repeat CMP in the AM    Hyperlipidemia: Lipid panel as above.  Continue with atorvastatin 10 mg p.o. nightly.  Normocytic Anemia: Patient's hemoglobin/medic is now 12.7/37.1.  Check anemia panel in AM.  Continue monitor for signs of symptomatic; no overt bleeding noted -Repeat CBC in a.m.   Chronic osteomyelitis (HCC) -continue outpatient follow up with Dr.  Lajoyce Corners -appears stable and currently not demonstrating worsening infection. -patient is afebrile and with normal WBC's   Depression/Anxiety: Continue with bupropion 150 mg p.o. nightly

## 2023-05-05 ENCOUNTER — Other Ambulatory Visit: Payer: Self-pay | Admitting: Neurology

## 2023-05-05 ENCOUNTER — Inpatient Hospital Stay (HOSPITAL_COMMUNITY)

## 2023-05-05 DIAGNOSIS — I6523 Occlusion and stenosis of bilateral carotid arteries: Secondary | ICD-10-CM | POA: Diagnosis not present

## 2023-05-05 DIAGNOSIS — I618 Other nontraumatic intracerebral hemorrhage: Secondary | ICD-10-CM | POA: Diagnosis not present

## 2023-05-05 DIAGNOSIS — E1122 Type 2 diabetes mellitus with diabetic chronic kidney disease: Secondary | ICD-10-CM | POA: Diagnosis not present

## 2023-05-05 DIAGNOSIS — M8660 Other chronic osteomyelitis, unspecified site: Secondary | ICD-10-CM | POA: Diagnosis not present

## 2023-05-05 DIAGNOSIS — E785 Hyperlipidemia, unspecified: Secondary | ICD-10-CM | POA: Diagnosis not present

## 2023-05-05 DIAGNOSIS — R29702 NIHSS score 2: Secondary | ICD-10-CM | POA: Diagnosis not present

## 2023-05-05 DIAGNOSIS — I619 Nontraumatic intracerebral hemorrhage, unspecified: Secondary | ICD-10-CM | POA: Diagnosis not present

## 2023-05-05 DIAGNOSIS — K219 Gastro-esophageal reflux disease without esophagitis: Secondary | ICD-10-CM | POA: Diagnosis not present

## 2023-05-05 DIAGNOSIS — I610 Nontraumatic intracerebral hemorrhage in hemisphere, subcortical: Secondary | ICD-10-CM

## 2023-05-05 LAB — CBC WITH DIFFERENTIAL/PLATELET
Abs Immature Granulocytes: 0.04 10*3/uL (ref 0.00–0.07)
Basophils Absolute: 0.1 10*3/uL (ref 0.0–0.1)
Basophils Relative: 1 %
Eosinophils Absolute: 0.1 10*3/uL (ref 0.0–0.5)
Eosinophils Relative: 3 %
HCT: 39 % (ref 39.0–52.0)
Hemoglobin: 13.5 g/dL (ref 13.0–17.0)
Immature Granulocytes: 1 %
Lymphocytes Relative: 21 %
Lymphs Abs: 1.2 10*3/uL (ref 0.7–4.0)
MCH: 29.6 pg (ref 26.0–34.0)
MCHC: 34.6 g/dL (ref 30.0–36.0)
MCV: 85.5 fL (ref 80.0–100.0)
Monocytes Absolute: 0.6 10*3/uL (ref 0.1–1.0)
Monocytes Relative: 11 %
Neutro Abs: 3.6 10*3/uL (ref 1.7–7.7)
Neutrophils Relative %: 63 %
Platelets: 189 10*3/uL (ref 150–400)
RBC: 4.56 MIL/uL (ref 4.22–5.81)
RDW: 14 % (ref 11.5–15.5)
WBC: 5.6 10*3/uL (ref 4.0–10.5)
nRBC: 0 % (ref 0.0–0.2)

## 2023-05-05 LAB — COMPREHENSIVE METABOLIC PANEL
ALT: 18 U/L (ref 0–44)
AST: 17 U/L (ref 15–41)
Albumin: 3.2 g/dL — ABNORMAL LOW (ref 3.5–5.0)
Alkaline Phosphatase: 45 U/L (ref 38–126)
Anion gap: 11 (ref 5–15)
BUN: 33 mg/dL — ABNORMAL HIGH (ref 8–23)
CO2: 26 mmol/L (ref 22–32)
Calcium: 9.3 mg/dL (ref 8.9–10.3)
Chloride: 99 mmol/L (ref 98–111)
Creatinine, Ser: 1.67 mg/dL — ABNORMAL HIGH (ref 0.61–1.24)
GFR, Estimated: 43 mL/min — ABNORMAL LOW (ref >60)
Glucose, Bld: 186 mg/dL — ABNORMAL HIGH (ref 70–99)
Potassium: 4.2 mmol/L (ref 3.5–5.1)
Sodium: 136 mmol/L (ref 135–145)
Total Bilirubin: 0.9 mg/dL (ref 0.0–1.2)
Total Protein: 6.1 g/dL — ABNORMAL LOW (ref 6.5–8.1)

## 2023-05-05 LAB — GLUCOSE, CAPILLARY
Glucose-Capillary: 208 mg/dL — ABNORMAL HIGH (ref 70–99)
Glucose-Capillary: 208 mg/dL — ABNORMAL HIGH (ref 70–99)

## 2023-05-05 LAB — MAGNESIUM: Magnesium: 2 mg/dL (ref 1.7–2.4)

## 2023-05-05 LAB — PHOSPHORUS: Phosphorus: 3.5 mg/dL (ref 2.5–4.6)

## 2023-05-05 MED ORDER — SENNOSIDES-DOCUSATE SODIUM 8.6-50 MG PO TABS: 2.0000 | ORAL_TABLET | Freq: Every evening | ORAL | 0 refills | Status: DC | PRN

## 2023-05-05 MED ORDER — ENSURE ENLIVE PO LIQD: 237.0000 mL | Freq: Two times a day (BID) | ORAL | 12 refills | Status: DC

## 2023-05-05 MED ORDER — SODIUM CHLORIDE 0.9 % IV SOLN: INTRAVENOUS | Status: DC

## 2023-05-05 NOTE — Inpatient Diabetes Management (Signed)
 Inpatient Diabetes Program Recommendations  AACE/ADA: New Consensus Statement on Inpatient Glycemic Control (2015)  Target Ranges:  Prepandial:   less than 140 mg/dL      Peak postprandial:   less than 180 mg/dL (1-2 hours)      Critically ill patients:  140 - 180 mg/dL   Lab Results  Component Value Date   GLUCAP 208 (H) 05/05/2023   HGBA1C 8.1 (H) 05/03/2023    Review of Glycemic Control  Latest Reference Range & Units 05/04/23 06:12 05/04/23 11:49 05/04/23 16:57 05/04/23 21:05 05/05/23 06:16  Glucose-Capillary 70 - 99 mg/dL 413 (H) 244 (H) 010 (H) 187 (H) 208 (H)  (H): Data is abnormally high  Diabetes history: DM2 Outpatient Diabetes medications: Toujeo 20 every day, Glipizide 5 BID, Actos 30 every day  Current orders for Inpatient glycemic control: Lantus 20 units every day, Novolog 0-9 units TID and 0-5 units QHS  Inpatient Diabetes Program Recommendations:    Might consider:  Novolog 2 units TID with meals if he consumes at least 50%.  Will continue to follow while inpatient.  Thank you, Dulce Sellar, MSN, CDCES Diabetes Coordinator Inpatient Diabetes Program [phone] (team pager from 8a-5p)

## 2023-05-05 NOTE — Progress Notes (Signed)
 STROKE TEAM PROGRESS NOTE    SIGNIFICANT HOSPITAL EVENTS  3/19: Presented to [facility] for evaluation of ongoing headaches. MRI revealed subacute left corona radiata microhemorrhage.  Transferred to [facility] for full stroke workup.  INTERIM HISTORY/SUBJECTIVE No family at the bedside. Pt lying  in bed, no acute event overnight. Repeat CT head this am showed again unchanged 4 mm hyperdense focus in the left frontal white matter, indeterminate for a small hemorrhage versus calcification.  OBJECTIVE  CBC    Component Value Date/Time   WBC 5.6 05/05/2023 0534   RBC 4.56 05/05/2023 0534   HGB 13.5 05/05/2023 0534   HCT 39.0 05/05/2023 0534   PLT 189 05/05/2023 0534   MCV 85.5 05/05/2023 0534   MCH 29.6 05/05/2023 0534   MCHC 34.6 05/05/2023 0534   RDW 14.0 05/05/2023 0534   LYMPHSABS 1.2 05/05/2023 0534   MONOABS 0.6 05/05/2023 0534   EOSABS 0.1 05/05/2023 0534   BASOSABS 0.1 05/05/2023 0534    BMET    Component Value Date/Time   NA 136 05/05/2023 0534   K 4.2 05/05/2023 0534   CL 99 05/05/2023 0534   CO2 26 05/05/2023 0534   GLUCOSE 186 (H) 05/05/2023 0534   BUN 33 (H) 05/05/2023 0534   CREATININE 1.67 (H) 05/05/2023 0534   CREATININE 1.43 (H) 01/09/2019 1019   CALCIUM 9.3 05/05/2023 0534   GFRNONAA 43 (L) 05/05/2023 0534   GFRNONAA 50 (L) 01/09/2019 1019    IMAGING past 24 hours CT HEAD WO CONTRAST Result Date: 05/05/2023 CLINICAL DATA:  Neuro deficit, acute, stroke suspected.  Headache. EXAM: CT HEAD WITHOUT CONTRAST TECHNIQUE: Contiguous axial images were obtained from the base of the skull through the vertex without intravenous contrast. RADIATION DOSE REDUCTION: This exam was performed according to the departmental dose-optimization program which includes automated exposure control, adjustment of the mA and/or kV according to patient size and/or use of iterative reconstruction technique. COMPARISON:  Head CT and MRI 05/03/2023 FINDINGS: Brain: The 4 mm hyperdense  focus in the left frontal white matter at the level of the corona radiata is unchanged in size from the recent prior CT, although its density is slightly more prominent on today's reformats and it is currently felt to be indeterminate for a small acute hemorrhage as suggested on the MRI versus calcification. A thin halo of surrounding low-density is unchanged. No new intracranial hemorrhage, acute infarct, midline shift, or extra-axial fluid collection is identified. Cerebral volume is within normal limits for age. The ventricles are normal in size. There is a background of mild chronic small vessel ischemia in the cerebral white matter. Vascular: Calcified atherosclerosis at the skull base. No hyperdense vessel. Skull: No acute fracture or suspicious lesion. Sinuses/Orbits: Visualized paranasal sinuses and mastoid air cells are clear. Unremarkable included orbits. Other: None. IMPRESSION: Unchanged 4 mm hyperdense focus in the left frontal white matter, indeterminate for a small hemorrhage versus calcification. No worsening edema or new intracranial abnormality. Continued follow-up imaging is recommended. Electronically Signed   By: Sebastian Ache M.D.   On: 05/05/2023 10:36    Vitals:   05/05/23 0346 05/05/23 0803 05/05/23 1118 05/05/23 1456  BP: 115/69 130/61 (!) 118/56 (!) 142/77  Pulse: 79 77 75 77  Resp: 16 18 19 18   Temp: 98.3 F (36.8 C) 98.5 F (36.9 C) 98.7 F (37.1 C) 98.7 F (37.1 C)  TempSrc: Oral Oral Oral Oral  SpO2: 97% 98% 95% 98%  Weight:      Height:  PHYSICAL EXAM General:  Alert, well-developed, somewhat disheveled patient in no acute distress Psych:  Mood and affect appropriate for situation CV: Regular rate and rhythm on monitor Respiratory:  Regular, unlabored respirations on room air  NEURO:  Mental Status: AA&Ox3, patient is able to give clear and coherent history Speech/Language: Moderate to severe dysarthria (edentulous), no aphasia.  Naming, repetition,  fluency, and comprehension intact.   Cranial Nerves:  II: PERRL. Visual fields full.  III, IV, VI: EOMI. Eyelids elevate symmetrically.  V: Sensation is intact to light touch and symmetrical to face.  VII: Face is symmetrical resting and smiling VIII: hearing intact to voice. IX, X: Mild dysarthria WJ:[id number] shrug 5/5. XII: tongue is midline without fasciculations. Motor: 5/5 strength to all muscle groups tested.  Tone: is normal and bulk is normal Sensation- Intact to light touch bilaterally. Extinction absent to light touch to DSS.   Coordination: FTN intact bilaterally, HKS: no ataxia in BLE.No drift.  Gait- deferred  Most Recent NIH:2   ASSESSMENT/PLAN  Mr. GIOVANIE LEFEBRE is a 73 y.o. male with history of DM2 with neuropathy, HTN, HLD, CKD 3A, GERD, osteomyelitis of right foot s/p right TMA, prior TMA of left great toe with subsequent other surgeries who presented to [facility] for evaluation persistent headaches x 1 week in the setting of severely elevated blood pressures and multiple cerebrovascular risk factors, severely hypertensive in ED.  He was transferred and admitted to [facility] for stroke workup after MRI revealed small area of acute to subacute microhemorrhage in the left frontal area, primary microhemorrhages versus hemorrhagic transformation of ischemic infarct.  NIH on Admission: 2.  Tiny acute to subacute left BG ICH, likely from small vessel disease CT head 5 mm indeterminate hyperdensity in the anterior left corona radiata-small vessel related microhemorrhage versus dystrophic calcification or chronic blood products.  MRI with and without contrast Constellation of signal abnormality in the left corona radiata at the CT abnormalities most consistent with an acute to subacute microhemorrhage. Mild surrounding edema but no mass effect.  MRA unremarkable CT repeat Unchanged 4 mm hyperdense focus in the left frontal white matter, indeterminate for a small hemorrhage  versus calcification. Carotid Doppler bilateral ICA 60 to 79% stenosis 2D Echo EF 55 to 60% LDL: 72 HgbA1c 8.1 UDS pending VTE prophylaxis - SCDs No antithrombotic prior to admission, now on No antithrombotic due to ICH. Repeat CT in 4-6 weeks at neuro follow up Therapy recommendations:  Home Health PT and Home Health OT Disposition:  home today.   Carotid stenosis Carotid Doppler bilateral ICA 60 to 79% stenosis May consider CTA head and neck once Cre improves Need to follow-up with vascular surgery as outpatient for more during  Hypertension Home meds:  hydralazine 25mg , metoprolol 100mg  Stable On home metoprolol and hydralazine Long-term BP goal normotensive  Hyperlipidemia Home meds:  Lipitor 10mg  LDL 72, goal < 70 Continue Lipitor 10 Continue statin on discharge  Diabetes type II Uncontrolled Home meds:  glipizide  HgbA1c 8.1, goal < 7.0 CBGs SSI Recommend close follow-up with PCP for better DM control  Other acute issues CKD 3A, creatinine 1.25--1.65--1.67  Hospital day # 2  Neurology will sign off. Please call with questions. Pt will follow up with stroke clinic NP at [facility] in about 4 weeks. Thanks for the consult.   Marvel Plan, MD PhD Stroke Neurology 05/05/2023 9:27 PM    To contact Stroke Continuity provider, please refer to [email or web address]. After hours, contact General Neurology

## 2023-05-05 NOTE — TOC Transition Note (Signed)
 Transition of Care [facility]) - Discharge Note   Patient Details  Name: [name] MRN: [id number] Date of Birth: 07-Oct-1950  Transition of [facility]) CM/SW Contact:  Kermit Balo, RN Phone Number: 05/05/2023, 2:22 PM   Clinical Narrative:     Pt is discharging home with self care. No follow up per PT.  Pt has transportation home.  Final next level of care: Home/Self Care Barriers to Discharge: No Barriers Identified   Patient Goals and CMS Choice   CMS Medicare.gov Compare Post Acute Care list provided to:: Patient Choice offered to / list presented to : Patient      Discharge Placement                       Discharge Plan and Services Additional resources added to the After Visit Summary for       Post Acute Care Choice: Home Health                               Social Drivers of Health (SDOH) Interventions SDOH Screenings   Food Insecurity: No Food Insecurity (05/04/2023)  Housing: Low Risk  (05/04/2023)  Transportation Needs: No Transportation Needs (05/04/2023)  Utilities: Not At Risk (05/04/2023)  Financial Resource Strain: Low Risk  (12/13/2021)  Social Connections: Socially Isolated (05/04/2023)  Tobacco Use: Low Risk  (05/03/2023)     Readmission Risk Interventions    11/30/2021    2:51 PM  Readmission Risk Prevention Plan  Transportation Screening Complete  HRI or Home Care Consult Complete  Social Work Consult for Recovery Care Planning/Counseling Complete  Palliative Care Screening Not Applicable  Medication Review [employer]) Complete

## 2023-05-05 NOTE — Care Management Important Message (Signed)
 Important Message  Patient Details  Name: [name] MRN: [id number] Date of Birth: April 05, 1950   Important Message Given:  Yes - Medicare IM     Dorena Bodo 05/05/2023, 3:06 PM

## 2023-05-06 NOTE — Discharge Summary (Signed)
 Physician Discharge Summary   Patient: [name] MRN: [id number] DOB: 1950/06/09  Admit date:     05/03/2023  Discharge date: 05/05/2023  Discharge Physician: Marguerita Merles, DO   PCP: Assunta Found, MD   Recommendations at discharge:  {Tip this will not be part of the note when signed- Example include specific recommendations for outpatient follow-up, pending tests to follow-up on. (Optional):26781}  ***  Discharge Diagnoses: Principal Problem:   Hemorrhagic stroke [facility]) Active Problems:   Hypertension   Gout   Gastroesophageal reflux disease   DM (diabetes mellitus) (HCC)   Stage 3a chronic kidney disease (HCC)   Hyperlipidemia   Chronic osteomyelitis (HCC)  Resolved Problems:   * No resolved hospital problems. 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  Further workup was done and his head CT showed subacute microhemorrhage stroke and case was discussed with neurology who recommended transferring the patient to [facility].  He is undergoing stroke workup and currently his echocardiogram, repeat head CT and carotid Dopplers are still pending.  Neuro feels that he is stable from their standpoint and PT OT recommending home health once he is medically cleared.  Assessment and Plan:  Hemorrhagic stroke (HCC) -No edema or midline shifting appreciated -Neurology service consulted and will follow patient once he arrived to [facility] -Repeat CT scan is still pending -Continue blood pressure control with goal for systolic blood pressure less than 160, -SCDs for DVT  prophylaxis at the moment -No large vessel occlusion appreciated; will check 2D echo still pending -Lipid panel done Show cholesterol/HDL ratio 6.1, cholesterol level 176, HDL 29, LDL 72, triglycerides 377 and VLDL 75; hemoglobin A1c still was 8.1 -Providing as needed analgesics and Tylenol as patient was complaining of headaches. -Neurostroke team consulted and checking carotid Dopplers and recommending CT scan which is still pending; echocardiogram done and showed an EF of 55 to 60% and carotid Dopplers were done showed: "Velocities in the right ICA are consistent with a 60-79%                 stenosis. The ECA appears >50% stenosed.   Left Carotid: Velocities in the left ICA are consistent with a 60-79%  stenosis.               The ECA appears >50% stenosed." -PT OT recommending home health.   Hypertension -Uncontrolled -Resuming metoprolol and adjusting dose of hydralazine -As needed labetalol will be provided to achieve recommended goals by neurology service. -At time of admission patient experiencing hypertensive emergency.  -Follow vital signs closely.   Gout: No acute flare. Continue her Boxell stat 80 mg p.o. daily.   Gastroesophageal reflux disease GI prophylaxis -Continue PPI.   DM (diabetes mellitus) type II (HCC) -Poorly controlled and with hyperglycemia; at baseline with chronic kidney disease stage IIIa -Follow CBGs fluctuation and adjust hypoglycemic regimen as needed -Sliding scale insulin and Semglee has been started.  He is now on 20 units subcu nightly and sensitive NovoLog/scale insulin ACHS.  CBGs ranging from 211-260   Stage 3a chronic kidney disease (HCC) metabolic acidosis -Appears to be stable and at baseline -BUN/Cr Trend: Recent  Labs  Lab 05/03/23 0900 05/04/23 1036 05/05/23 0534  BUN 24* 25* 33*  CREATININE 1.25* 1.65* 1.67*  -Has a slight metabolic acidosis with a CO2 of 20, anion gap of 13, chloride level of 102 -Avoid Nephrotoxic Medications,  Contrast Dyes, Hypotension and Dehydration to Ensure Adequate Renal Perfusion and will need to Renally Adjust Meds -Continue to Monitor and Trend Renal Function carefully and repeat CMP in the AM    Hyperlipidemia: Lipid panel as a.  Continue with atorvastatin 10 mg p.o. nightly.  Normocytic Anemia: Patient's hemoglobin/medic is now 12.7/37.1.  Check anemia panel in AM.  Continue monitor for signs of symptomatic; no overt bleeding noted -Repeat CBC in a.m.   Chronic osteomyelitis (HCC) -continue outpatient follow up with Dr. Lajoyce Corners -appears stable and currently not demonstrating worsening infection. -patient is afebrile and with normal WBC's   Depression/Anxiety: Continue with bupropion 150 mg p.o. nightly  Assessment and Plan: No notes have been filed under this hospital service. Service: Hospitalist     {Tip this will not be part of the note when signed Body mass index is 23.72 kg/m. , ,  (Optional):26781}  {(NOTE) Pain control PDMP Statment (Optional):26782} Consultants: *** Procedures performed: ***  Disposition: {Plan; Disposition:26390} Diet recommendation:  Discharge Diet Orders (From admission, onward)     Start     Ordered   05/05/23 0000  Diet - low sodium heart healthy        05/05/23 1354   05/05/23 0000  Diet Carb Modified        05/05/23 1354           {Diet_Plan:26776} DISCHARGE MEDICATION: Allergies as of 05/05/2023       Reactions   Ivp Dye [iodinated Contrast Media] Other (See Comments)   Sped heart rate up    Penicillins Rash   Pt tolerated amoxicillin oral challenge   Tetanus Toxoids Rash        Medication List     TAKE these medications    Accu-Chek Guide test strip Generic drug: glucose blood USE AS DIRECTED TO TEST TWICE DAILY   acetaminophen 500 MG tablet Commonly known as: TYLENOL Take 500-1,000 mg by mouth every 6 (six) hours as needed for mild pain or headache. Pain   atorvastatin 10 MG tablet Commonly known as:  LIPITOR Take 10 mg by mouth at bedtime.   B-complex with vitamin C tablet Take 1 tablet by mouth daily.   buPROPion 150 MG 24 hr tablet Commonly known as: WELLBUTRIN XL Take 150 mg by mouth at bedtime.   feeding supplement Liqd Take 237 mLs by mouth 2 (two) times daily between meals.   ferrous sulfate 325 (65 FE) MG EC tablet Take 325 mg by mouth daily with breakfast.   Fish Oil 1000 MG Caps Take 1,000 mg by mouth 2 (two) times daily. Afternoon & bedtime.   glipiZIDE 5 MG tablet Commonly known as: GLUCOTROL Take 5 mg by mouth 2 (two) times daily.   hydrALAZINE 25 MG tablet Commonly known as: APRESOLINE TAKE 1 TABLET BY MOUTH IN THE MORNING AND AT BEDTIME. MAY TAKE ADDITIONAL AS NEEDED FOR SYSTOLIC BLOOD PRESSURE CONSISTENTLY ABOVE 140.   metoprolol succinate 100 MG 24 hr tablet Commonly known as: TOPROL-XL TAKE 1 TABLET BY MOUTH DAILY. TAKE WITH OR IMMEDIATELY FOLLOWING A MEAL   omeprazole 20 MG capsule Commonly known as: PRILOSEC TAKE ONE CAPSULE BY MOUTH DAILY. NEEDS OFFICE VISIT FOR FURTHER REFILLS.   pioglitazone 30 MG tablet Commonly known as: ACTOS Take  30 mg by mouth daily.   senna-docusate 8.6-50 MG tablet Commonly known as: Senokot-S Take 2 tablets by mouth at bedtime as needed for mild constipation.   sertraline 100 MG tablet Commonly known as: ZOLOFT Take 100 mg by mouth at bedtime.   sodium bicarbonate 650 MG tablet Take 650 mg by mouth 2 (two) times daily.   Toujeo Max SoloStar 300 UNIT/ML Solostar Pen Generic drug: insulin glargine (2 Unit Dial) Inject 20 Units into the skin daily.   Uloric 80 MG Tabs Generic drug: Febuxostat Take 80 mg by mouth daily.   Vitamin D 50 MCG (2000 UT) Caps Take 2,000 Units by mouth daily.        Follow-up Information     [facility]. Schedule an appointment as soon as possible for a visit in 1 month(s).   Specialty: Neurology Why: stroke clinic Contact information: [address] [place] [address] [phone]               Discharge Exam: Ceasar Mons Weights   05/03/23 0829  Weight: 90.7 kg   ***  Condition at discharge: {DC Condition:26389}  The results of significant diagnostics from this hospitalization (including imaging, microbiology, ancillary and laboratory) are listed below for reference.   Imaging Studies: VAS US CAROTID Result Date: 05/06/2023 Carotid Arterial Duplex Study Patient Name:  [name]  Date of Exam:   05/04/2023 Medical Rec #: [id number]         Accession #:    [id number] Date of Birth: 1950/05/01         Patient Gender: M Patient Age:   45 years Exam Location:  [facility] Procedure:      VAS US CAROTID Referring Phys: Marvel Plan --------------------------------------------------------------------------------  Indications:       CVA. Risk Factors:      Hypertension, hyperlipidemia, Diabetes, no history of                    smoking. Other Factors:     CKD. Limitations        Today's exam was limited due to the body habitus of the                    patient, the patient's respiratory variation and facial hair                    (beard). Comparison Study:  No previous exams Performing Technologist: Jody Hill RVT, RDMS  Examination Guidelines: A complete evaluation includes B-mode imaging, spectral Doppler, color Doppler, and power Doppler as needed of all accessible portions of each vessel. Bilateral testing is considered an integral part of a complete examination. Limited examinations for reoccurring indications may be performed as noted.  Right Carotid Findings: +----------+--------+--------+--------+------------------+------------------+           PSV cm/sEDV cm/sStenosisPlaque DescriptionComments           +----------+--------+--------+--------+------------------+------------------+ CCA Prox  65      12                                                    +----------+--------+--------+--------+------------------+------------------+ CCA Distal61      10                                                   +----------+--------+--------+--------+------------------+------------------+  ICA Prox  220     52      60-79%  hypoechoic                           +----------+--------+--------+--------+------------------+------------------+ ICA Mid   232     50                                                   +----------+--------+--------+--------+------------------+------------------+ ICA Distal83      24                                                   +----------+--------+--------+--------+------------------+------------------+ ECA       279     14      >50%    calcific          calcific shadowing +----------+--------+--------+--------+------------------+------------------+ +----------+--------+-------+----------------+-------------------+           PSV cm/sEDV cmsDescribe        Arm Pressure (mmHG) +----------+--------+-------+----------------+-------------------+ [id number]            Multiphasic, WNL                    +----------+--------+-------+----------------+-------------------+ +---------+--------+--+--------+--+---------+ VertebralPSV cm/s43EDV cm/s11Antegrade +---------+--------+--+--------+--+---------+  Left Carotid Findings: +----------+--------+--------+--------+---------------------+------------------+           PSV cm/sEDV cm/sStenosisPlaque Description   Comments           +----------+--------+--------+--------+---------------------+------------------+ CCA Prox  80      15                                                      +----------+--------+--------+--------+---------------------+------------------+ CCA Distal75      17                                   intimal thickening +----------+--------+--------+--------+---------------------+------------------+ ICA Prox  226     41       60-79%  calcific and         calcific shadowing                                   heterogenous                            +----------+--------+--------+--------+---------------------+------------------+ ICA Mid   140     25                                                      +----------+--------+--------+--------+---------------------+------------------+ ICA Distal91      26                                                      +----------+--------+--------+--------+---------------------+------------------+  ECA       332     14      >50%    diffuse, calcific and                                                     heterogenous                            +----------+--------+--------+--------+---------------------+------------------+ +----------+--------+--------+----------------+-------------------+           PSV cm/sEDV cm/sDescribe        Arm Pressure (mmHG) +----------+--------+--------+----------------+-------------------+ [id number]             Multiphasic, WNL                    +----------+--------+--------+----------------+-------------------+ +---------+--------+--+--------+--+---------+ VertebralPSV cm/s45EDV cm/s14Antegrade +---------+--------+--+--------+--+---------+   Summary: Right Carotid: Velocities in the right ICA are consistent with a 60-79%                stenosis. The ECA appears >50% stenosed. Left Carotid: Velocities in the left ICA are consistent with a 60-79% stenosis.               The ECA appears >50% stenosed. Vertebrals:  Bilateral vertebral arteries demonstrate antegrade flow. Subclavians: Normal flow hemodynamics were seen in bilateral subclavian              arteries. *See table(s) above for measurements and observations.  Electronically signed by Delia Heady MD on 05/06/2023 at 11:14:08 AM.    Final    CT HEAD WO CONTRAST Result Date: 05/05/2023 CLINICAL DATA:  Neuro deficit, acute, stroke suspected.  Headache. EXAM: CT  HEAD WITHOUT CONTRAST TECHNIQUE: Contiguous axial images were obtained from the base of the skull through the vertex without intravenous contrast. RADIATION DOSE REDUCTION: This exam was performed according to the departmental dose-optimization program which includes automated exposure control, adjustment of the mA and/or kV according to patient size and/or use of iterative reconstruction technique. COMPARISON:  Head CT and MRI 05/03/2023 FINDINGS: Brain: The 4 mm hyperdense focus in the left frontal white matter at the level of the corona radiata is unchanged in size from the recent prior CT, although its density is slightly more prominent on today's reformats and it is currently felt to be indeterminate for a small acute hemorrhage as suggested on the MRI versus calcification. A thin halo of surrounding low-density is unchanged. No new intracranial hemorrhage, acute infarct, midline shift, or extra-axial fluid collection is identified. Cerebral volume is within normal limits for age. The ventricles are normal in size. There is a background of mild chronic small vessel ischemia in the cerebral white matter. Vascular: Calcified atherosclerosis at the skull base. No hyperdense vessel. Skull: No acute fracture or suspicious lesion. Sinuses/Orbits: Visualized paranasal sinuses and mastoid air cells are clear. Unremarkable included orbits. Other: None. IMPRESSION: Unchanged 4 mm hyperdense focus in the left frontal white matter, indeterminate for a small hemorrhage versus calcification. No worsening edema or new intracranial abnormality. Continued follow-up imaging is recommended. Electronically Signed   By: Sebastian Ache M.D.   On: 05/05/2023 10:36   ECHOCARDIOGRAM COMPLETE Result Date: 05/04/2023    ECHOCARDIOGRAM REPORT   Patient Name:   [name] Date of Exam: 05/04/2023 Medical Rec #:  [id number]  Height:       77.0 in Accession #:    [id number]       Weight:       200.0 lb Date of Birth:  05/14/1950         BSA:          2.237 m Patient Age:    72 years         BP:           122/67 mmHg Patient Gender: M                HR:           83 bpm. Exam Location:  Inpatient Procedure: 2D Echo, Cardiac Doppler and Color Doppler (Both Spectral and Color            Flow Doppler were utilized during procedure). Indications:    Stroke  History:        Patient has prior history of Echocardiogram examinations, most                 recent 02/28/2022. Risk Factors:Hypertension and Diabetes.  Sonographer:    Amy Chionchio Referring Phys: Vassie Loll IMPRESSIONS  1. Left ventricular ejection fraction, by estimation, is 55 to 60%. The left ventricle has normal function. The left ventricle has no regional wall motion abnormalities. Left ventricular diastolic parameters were normal.  2. Right ventricular systolic function is normal. The right ventricular size is normal. Tricuspid regurgitation signal is inadequate for assessing PA pressure.  3. The mitral valve is grossly normal. No evidence of mitral valve regurgitation. No evidence of mitral stenosis.  4. The aortic valve is tricuspid. Aortic valve regurgitation is not visualized. Aortic valve sclerosis is present, with no evidence of aortic valve stenosis.  5. The inferior vena cava is normal in size with greater than 50% respiratory variability, suggesting right atrial pressure of 3 mmHg. Conclusion(s)/Recommendation(s): No intracardiac source of embolism detected on this transthoracic study. Consider a transesophageal echocardiogram to exclude cardiac source of embolism if clinically indicated. FINDINGS  Left Ventricle: Left ventricular ejection fraction, by estimation, is 55 to 60%. The left ventricle has normal function. The left ventricle has no regional wall motion abnormalities. The left ventricular internal cavity size was normal in size. There is  no left ventricular hypertrophy. Left ventricular diastolic parameters were normal. Right Ventricle: The right ventricular size  is normal. No increase in right ventricular wall thickness. Right ventricular systolic function is normal. Tricuspid regurgitation signal is inadequate for assessing PA pressure. Left Atrium: Left atrial size was normal in size. Right Atrium: Right atrial size was normal in size. Pericardium: There is no evidence of pericardial effusion. Mitral Valve: The mitral valve is grossly normal. No evidence of mitral valve regurgitation. No evidence of mitral valve stenosis. Tricuspid Valve: The tricuspid valve is normal in structure. Tricuspid valve regurgitation is not demonstrated. No evidence of tricuspid stenosis. Aortic Valve: The aortic valve is tricuspid. Aortic valve regurgitation is not visualized. Aortic valve sclerosis is present, with no evidence of aortic valve stenosis. Aortic valve mean gradient measures 5.0 mmHg. Aortic valve peak gradient measures 8.1  mmHg. Aortic valve area, by VTI measures 2.22 cm. Pulmonic Valve: The pulmonic valve was grossly normal. Pulmonic valve regurgitation is not visualized. No evidence of pulmonic stenosis. Aorta: The aortic root and ascending aorta are structurally normal, with no evidence of dilitation. Venous: The inferior vena cava is normal in size with greater than 50% respiratory variability, suggesting right atrial pressure  of 3 mmHg. IAS/Shunts: No atrial level shunt detected by color flow Doppler.  LEFT VENTRICLE PLAX 2D LVIDd:         4.40 cm LVIDs:         3.20 cm LV PW:         0.90 cm LV IVS:        1.00 cm LVOT diam:     2.00 cm LV SV:         58 LV SV Index:   26 LVOT Area:     3.14 cm  LV Volumes (MOD) LV vol d, MOD A2C: 64.6 ml LV vol d, MOD A4C: 64.6 ml LV vol s, MOD A2C: 18.3 ml LV vol s, MOD A4C: 23.4 ml LV SV MOD A2C:     46.3 ml LV SV MOD A4C:     64.6 ml LV SV MOD BP:      47.9 ml RIGHT VENTRICLE          IVC RV Basal diam:  3.30 cm  IVC diam: 1.70 cm TAPSE (M-mode): 1.6 cm LEFT ATRIUM             Index        RIGHT ATRIUM           Index LA Vol (A2C):    52.6 ml 23.52 ml/m  RA Area:     17.80 cm LA Vol (A4C):   56.6 ml 25.30 ml/m  RA Volume:   41.70 ml  18.64 ml/m LA Biplane Vol: 56.5 ml 25.26 ml/m  AORTIC VALVE                     PULMONIC VALVE AV Area (Vmax):    2.48 cm      PV Vmax:       0.90 m/s AV Area (Vmean):   2.19 cm      PV Peak grad:  3.2 mmHg AV Area (VTI):     2.22 cm AV Vmax:           142.00 cm/s AV Vmean:          110.000 cm/s AV VTI:            0.260 m AV Peak Grad:      8.1 mmHg AV Mean Grad:      5.0 mmHg LVOT Vmax:         112.00 cm/s LVOT Vmean:        76.800 cm/s LVOT VTI:          0.184 m LVOT/AV VTI ratio: 0.71  AORTA Ao Root diam: 3.30 cm Ao Asc diam:  3.40 cm  SHUNTS Systemic VTI:  0.18 m Systemic Diam: 2.00 cm Sunit Tolia Electronically signed by Tessa Lerner Signature Date/Time: 05/04/2023/6:57:01 PM    Final    MR BRAIN W CONTRAST Result Date: 05/03/2023 CLINICAL DATA:  Headache. EXAM: MRI HEAD WITH CONTRAST TECHNIQUE: Multiplanar, multiecho pulse sequences of the brain and surrounding structures were obtained with intravenous contrast. CONTRAST:  9mL GADAVIST GADOBUTROL 1 MMOL/ML IV SOLN COMPARISON:  Noncontrast head MRI and CT earlier today FINDINGS: Faint, non-masslike enhancement is present in the left corona radiata at the site of the signal abnormality detailed on today's earlier noncontrast MRI. No abnormal intracranial enhancement is identified elsewhere. Normal vascular enhancement is present. IMPRESSION: Faint, non-masslike enhancement in the left corona radiata at the site of the suspected recent microhemorrhage on today's earlier noncontrast MRI. This has a benign appearance and is  favored to reflect mild blood-brain barrier breakdown related to the hemorrhage and possibly an associated small surrounding subacute infarct. A follow-up MRI may be worthwhile in 2-3 months to ensure resolution. Electronically Signed   By: Sebastian Ache M.D.   On: 05/03/2023 13:46   MR FOOT RIGHT WO CONTRAST Result Date:  05/03/2023 CLINICAL DATA:  Concern for right foot osteomyelitis.  Infection. EXAM: MRI OF THE RIGHT FOREFOOT WITHOUT CONTRAST TECHNIQUE: Multiplanar, multisequence MR imaging of the right foot was performed. No intravenous contrast was administered. COMPARISON:  Right foot radiographs 03/01/2023, MRI right foot 03/01/2023 FINDINGS: Bones/Joint/Cartilage Postsurgical changes are again seen of amputation of the first through fifth metatarsals through the mid shafts, not significantly changed from 02/27/2023. There is mild interval decrease in size of the transverse dimension of marrow edema surrounding the previously described chronic defect/channel within the distal, mid transverse aspect of the first metatarsal amputation margin. Overall this marrow edema measures up to approximately 6 x 14 x 7 mm (transverse by long axis of the foot by short axis of the foot, coronal series 8, image 8 and sagittal series 4, image 18) compared to 8 x 15 x 8 mm on 03/01/2023. The borders of the marrow edema or now more well-defined. This may indicate minimal interval decrease in the prior osteomyelitis/healing of the bone. There is resolution of the prior patchy marrow edema within the lateral navicular. Near resolution of the prior marrow edema within the medial cuneiform a, now with minimal residual marrow edema seen at the distal lateral aspect (coronal series 8, image 10). The marrow edema within the lateral base of the fourth metatarsal also appears to have resolved. Moderate fourth tarsometatarsal cartilage thinning with mild peripheral osteophytosis. Mild third and fifth tarsometatarsal peripheral degenerative spurring. Moderate-to-large plantar calcaneal heel spur and posterior calcaneal heel spur at the plantar fascia and Achilles tendon insertions, respectively. Ligaments Muscles and Tendons Unchanged moderate chronic thickening at the distal Achilles tendon insertionw. Mild edema and high-grade fatty infiltration of the  regional musculature, unchanged from prior. Soft tissues There is a small region of blooming artifact related to air within a soft tissue ulcer plantar to the distal aspect of the first metatarsal amputation stump, likely slightly decreased in size from prior. This now appears to measure up to 10 mm along the longitudinal dimension of the foot compared to 17 mm previously (sagittal series 4, image 16). IMPRESSION: Compared to 03/01/2023: 1. 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 2. Slight interval decrease in size of the soft tissue ulcer distal and plantar to the first metatarsal amputation site. 3. Minimal interval decrease in size of predominantly the transverse dimension of marrow edema surrounding the previously described chronic defect/channel within the distal, mid transverse aspect of the first metatarsal amputation margin. The borders of the marrow edema are now more well-defined. This may indicate minimal interval decrease in the prior osteomyelitis and mild interval healing of the bone. No worsening or new areas concerning for acute osteomyelitis. 4. Resolution of the prior patchy marrow edema within the lateral navicular. Near resolution of the prior marrow edema within the medial cuneiform, now with minimal residual marrow edema seen at the distal lateral aspect. The marrow edema within the lateral base of the fourth metatarsal also appears to have resolved. 5. Moderate fourth tarsometatarsal osteoarthritis. Electronically Signed   By: Neita Garnet M.D.   On: 05/03/2023 13:14   DG Chest Portable 1 View  Result Date: 05/03/2023 CLINICAL DATA:  73 year old male with acute headache, altered mental status. Hypertensive, systolic 220. EXAM: PORTABLE CHEST 1 VIEW COMPARISON:  Portable chest 03/25/2023. FINDINGS: Portable AP upright view at 1017 hours. Stable cardiac size at the upper limits of normal.  Other mediastinal contours are within normal limits. Visualized tracheal air column is within normal limits. Allowing for portable technique the lungs are clear. No pneumothorax or pleural effusion. No acute osseous abnormality identified. Paucity of bowel gas. IMPRESSION: No acute cardiopulmonary abnormality. Electronically Signed   By: Odessa Fleming M.D.   On: 05/03/2023 10:57   MR ANGIO HEAD WO CONTRAST Result Date: 05/03/2023 CLINICAL DATA:  73 year old male neurologic deficit. New onset headache with 5 mm left corona radiata microhemorrhage. EXAM: MRA HEAD WITHOUT CONTRAST TECHNIQUE: Angiographic images of the Circle of Willis were acquired using MRA technique without intravenous contrast. COMPARISON:  Head CT, MRI today reported separately. FINDINGS: Anterior circulation: Antegrade flow in both ICA siphons. Bilateral siphon irregularity in keeping with atherosclerosis. No significant siphon stenosis. Ophthalmic artery origins appear within normal limits. Patent carotid termini. Normal MCA and ACA origins. Tortuous A1 segments. Diminutive or absent anterior communicating artery. Mildly dominant left ACA. Visible ACA branches are within normal limits. Bilateral MCA M1 segments and MCA trifurcations appear patent without stenosis. Visible bilateral MCA branches are within normal limits. Posterior circulation: Antegrade flow in the distal vertebral arteries which appear codominant. Patent PICA origins. Patent vertebrobasilar junction and basilar artery without evidence of stenosis. Normal SCA and patent PCA origins. Posterior communicating arteries are diminutive or absent. Mild PCA tortuosity and irregularity, no significant stenosis. Anatomic variants: None. Other: Left corona radiata area of the suspected microhemorrhage by CT and MRI is on series 18, image 3 with no evidence of abnormal vascularity. IMPRESSION: 1. Intracranial MRA negative aside from evidence of intracranial atherosclerosis. No hemodynamically  significant stenosis. 2. Abnormal Brain MRI today reported separately. Electronically Signed   By: Odessa Fleming M.D.   On: 05/03/2023 10:56   MR BRAIN WO CONTRAST Result Date: 05/03/2023 CLINICAL DATA:  73 year old male neurologic deficit. New onset headache with indeterminate 5 mm left corona radiata hyperdensity on head CT. EXAM: MRI HEAD WITHOUT CONTRAST TECHNIQUE: Multiplanar, multiecho pulse sequences of the brain and surrounding structures were obtained without intravenous contrast. COMPARISON:  Head CT 0852 hours today. FINDINGS: Brain: Left corona radiata area of concern based on earlier head CT demonstrates confluent 14 mm area of T2 and FLAIR hyperintensity with T2 shine through on DWI (series 5, image 25) and subtle susceptibility on SWI (series 13, image 38). No regional mass effect. Other scattered white matter T2 and FLAIR hyperintensity although none as confluent as that in the left corona radiata. No restricted diffusion to suggest acute infarction. No midline shift, mass effect, evidence of mass lesion, ventriculomegaly, extra-axial collection. No cortical encephalomalacia identified. No other cerebral blood products identified on SWI. Deep gray nuclei, and brainstem remain within normal limits. Cervicomedullary junction and pituitary are within normal limits. Possible small chronic linear lacunar infarct in the left lateral cerebellum series 10, image 5. Vascular: Major intracranial vascular flow voids are preserved. Skull and upper cervical spine: Visualized bone marrow signal is within normal limits. Negative for age visible cervical spine. Sinuses/Orbits: Negative orbits. Paranasal Visualized paranasal sinuses and mastoids are stable and well aerated. Other: Visible internal auditory structures appear normal. IMPRESSION: 1. Constellation of signal abnormality in the left corona radiata at the CT abnormality is most consistent with an Acute or Subacute Microhemorrhage.  Mild surrounding edema but no  mass effect or other complicating features. 2. Negative for superimposed acute infarct and otherwise mild for age signal changes of chronic small vessel disease. 3. MRA reported separately. Electronically Signed   By: Odessa Fleming M.D.   On: 05/03/2023 10:52   CT HEAD WO CONTRAST Result Date: 05/03/2023 CLINICAL DATA:  73 year old male with new onset headache. EXAM: CT HEAD WITHOUT CONTRAST TECHNIQUE: Contiguous axial images were obtained from the base of the skull through the vertex without intravenous contrast. RADIATION DOSE REDUCTION: This exam was performed according to the departmental dose-optimization program which includes automated exposure control, adjustment of the mA and/or kV according to patient size and/or use of iterative reconstruction technique. COMPARISON:  Head CT 04/19/2011. FINDINGS: Brain: Cerebral volume remains normal for age. Incidental choroid plexus cysts, normal variant. No midline shift, ventriculomegaly, mass effect, or evidence of cortically based acute infarction. Increased mild to moderate for age scattered and mostly periventricular white matter hypodensity. Superimposed small slightly round and amorphous 5 mm hyperdensity in the left anterior corona radiata (series 2, image 19 and series 4, image 33. This is not definitely calcified (Hounsfield units up to 90) Regional hypodensity but no regional mass effect. No other evidence of intracranial hemorrhage. And elsewhere gray-white differentiation is maintained. Vascular: Calcified atherosclerosis at the skull base. No suspicious intracranial vascular hyperdensity. Skull: Stable and intact. Sinuses/Orbits: Visualized paranasal sinuses and mastoids are clear. Other: Visualized orbits and scalp soft tissues are within normal limits. IMPRESSION: 1. Progressed cerebral white matter disease since 2013 with a 5 mm indeterminate hyperdensity in the anterior left corona radiata. Differential considerations include an Acute small-vessel  related micro-Hemorrhage versus dystrophic calcification, or chronic blood products. No associated intracranial mass effect. Follow-up Brain MRI (without and with contrast preferred) might characterize further. 2. No other acute intracranial abnormality by CT. Electronically Signed   By: Odessa Fleming M.D.   On: 05/03/2023 09:07    Microbiology: Results for orders placed or performed during the hospital encounter of 05/03/23  C Difficile Quick Screen w PCR reflex     Status: None   Collection Time: 05/04/23  8:51 AM   Specimen: STOOL  Result Value Ref Range Status   C Diff antigen NEGATIVE NEGATIVE Final   C Diff toxin NEGATIVE NEGATIVE Final   C Diff interpretation No C. difficile detected.  Final    Comment: Performed at [facility], [address], [place], [place] [address]    Labs: CBC: Recent Labs  Lab 05/03/23 0900 05/04/23 1036 05/05/23 0534  WBC 6.2 6.4 5.6  NEUTROABS 4.6 4.6 3.6  HGB 13.4 12.7* 13.5  HCT 40.6 37.1* 39.0  MCV 89.2 86.9 85.5  PLT 164 171 189   Basic Metabolic Panel: Recent Labs  Lab 05/03/23 0900 05/04/23 1036 05/05/23 0534  NA 135 135 136  K 3.8 4.4 4.2  CL 101 102 99  CO2 25 20* 26  GLUCOSE 268* 232* 186*  BUN 24* 25* 33*  CREATININE 1.25* 1.65* 1.67*  CALCIUM 8.4* 8.4* 9.3  MG  --  1.8 2.0  PHOS  --  3.9 3.5   Liver Function Tests: Recent Labs  Lab 05/03/23 0900 05/04/23 1036 05/05/23 0534  AST 19 24 17   ALT 18 18 18   ALKPHOS 67 41 45  BILITOT 0.4 1.3* 0.9  PROT 6.7 6.0* 6.1*  ALBUMIN 3.5 3.2* 3.2*   CBG: Recent Labs  Lab 05/04/23 1149 05/04/23 1657 05/04/23 2105 05/05/23 0616 05/05/23 1118  GLUCAP 260* 248* 187* 208* 208*    Discharge time spent: {LESS THAN/GREATER THAN:26388} 30 minutes.  Signed: Merlene Laughter, DO [facility] 05/06/2023

## 2023-05-08 ENCOUNTER — Other Ambulatory Visit: Payer: Self-pay | Admitting: Nurse Practitioner

## 2023-05-09 ENCOUNTER — Emergency Department (HOSPITAL_COMMUNITY)

## 2023-05-09 ENCOUNTER — Emergency Department (HOSPITAL_COMMUNITY)
Admission: EM | Admit: 2023-05-09 | Discharge: 2023-05-09 | Disposition: A | Discharge status: Home / Self Care | Attending: Emergency Medicine | Admitting: Emergency Medicine

## 2023-05-09 ENCOUNTER — Other Ambulatory Visit: Payer: Self-pay

## 2023-05-09 ENCOUNTER — Encounter (HOSPITAL_COMMUNITY): Payer: Self-pay

## 2023-05-09 DIAGNOSIS — R519 Headache, unspecified: Secondary | ICD-10-CM | POA: Diagnosis not present

## 2023-05-09 DIAGNOSIS — Z79899 Other long term (current) drug therapy: Secondary | ICD-10-CM | POA: Diagnosis not present

## 2023-05-09 DIAGNOSIS — R079 Chest pain, unspecified: Secondary | ICD-10-CM | POA: Diagnosis not present

## 2023-05-09 DIAGNOSIS — I499 Cardiac arrhythmia, unspecified: Secondary | ICD-10-CM | POA: Diagnosis not present

## 2023-05-09 DIAGNOSIS — Z743 Need for continuous supervision: Secondary | ICD-10-CM | POA: Diagnosis not present

## 2023-05-09 DIAGNOSIS — I10 Essential (primary) hypertension: Secondary | ICD-10-CM | POA: Insufficient documentation

## 2023-05-09 DIAGNOSIS — R6889 Other general symptoms and signs: Secondary | ICD-10-CM | POA: Diagnosis not present

## 2023-05-09 DIAGNOSIS — R0789 Other chest pain: Secondary | ICD-10-CM | POA: Diagnosis not present

## 2023-05-09 DIAGNOSIS — R42 Dizziness and giddiness: Secondary | ICD-10-CM | POA: Diagnosis present

## 2023-05-09 LAB — CBC WITH DIFFERENTIAL/PLATELET
Abs Immature Granulocytes: 0.03 10*3/uL (ref 0.00–0.07)
Basophils Absolute: 0 10*3/uL (ref 0.0–0.1)
Basophils Relative: 1 %
Eosinophils Absolute: 0.2 10*3/uL (ref 0.0–0.5)
Eosinophils Relative: 3 %
HCT: 40.9 % (ref 39.0–52.0)
Hemoglobin: 13.8 g/dL (ref 13.0–17.0)
Immature Granulocytes: 1 %
Lymphocytes Relative: 25 %
Lymphs Abs: 1.6 10*3/uL (ref 0.7–4.0)
MCH: 29.9 pg (ref 26.0–34.0)
MCHC: 33.7 g/dL (ref 30.0–36.0)
MCV: 88.5 fL (ref 80.0–100.0)
Monocytes Absolute: 0.7 10*3/uL (ref 0.1–1.0)
Monocytes Relative: 11 %
Neutro Abs: 3.8 10*3/uL (ref 1.7–7.7)
Neutrophils Relative %: 59 %
Platelets: 192 10*3/uL (ref 150–400)
RBC: 4.62 MIL/uL (ref 4.22–5.81)
RDW: 13.7 % (ref 11.5–15.5)
WBC: 6.5 10*3/uL (ref 4.0–10.5)
nRBC: 0 % (ref 0.0–0.2)

## 2023-05-09 LAB — BASIC METABOLIC PANEL
Anion gap: 10 (ref 5–15)
BUN: 30 mg/dL — ABNORMAL HIGH (ref 8–23)
CO2: 24 mmol/L (ref 22–32)
Calcium: 9.3 mg/dL (ref 8.9–10.3)
Chloride: 102 mmol/L (ref 98–111)
Creatinine, Ser: 1.34 mg/dL — ABNORMAL HIGH (ref 0.61–1.24)
GFR, Estimated: 56 mL/min — ABNORMAL LOW (ref >60)
Glucose, Bld: 208 mg/dL — ABNORMAL HIGH (ref 70–99)
Potassium: 4.1 mmol/L (ref 3.5–5.1)
Sodium: 136 mmol/L (ref 135–145)

## 2023-05-09 LAB — TROPONIN I (HIGH SENSITIVITY)
Troponin I (High Sensitivity): 5 ng/L (ref <18)
Troponin I (High Sensitivity): 6 ng/L (ref <18)

## 2023-05-09 MED ORDER — HYDRALAZINE HCL 20 MG/ML IJ SOLN
20.0000 mg | Freq: Once | INTRAMUSCULAR | Status: AC
  Administered 2023-05-09: 20 mg via INTRAVENOUS
  Filled 2023-05-09: qty 1

## 2023-05-09 NOTE — ED Triage Notes (Signed)
 Pt brought in by [facility] c/I hypertension, dizziness and substernal chest pain. BP was around 230

## 2023-05-09 NOTE — Discharge Instructions (Addendum)
 Take all your medications when you get home as prescribed.

## 2023-05-09 NOTE — ED Provider Notes (Signed)
 [place] EMERGENCY DEPARTMENT AT [facility] Provider Note   CSN: [id number] Arrival date & time: 05/09/23  4098     History  Chief Complaint  Patient presents with   Hypertension    [name] is a 73 y.o. male.  Patient presents to the emergency department for evaluation of chest pain.  Patient reports that he woke up and felt dizziness and some substernal chest pain.  He comes to the ED by ambulance.  Patient's blood pressure is elevated.  He reports that he has been taking his medications as prescribed.  No shortness of breath.       Home Medications Prior to Admission medications   Medication Sig Start Date End Date Taking? Authorizing Provider  ACCU-CHEK GUIDE test strip USE AS DIRECTED TO TEST TWICE DAILY 02/01/22   [provider]  acetaminophen (TYLENOL) 500 MG tablet Take 500-1,000 mg by mouth every 6 (six) hours as needed for mild pain or headache. Pain    [provider]  atorvastatin (LIPITOR) 10 MG tablet Take 10 mg by mouth at bedtime. 09/21/17   [provider]  B Complex-C (B-COMPLEX WITH VITAMIN C) tablet Take 1 tablet by mouth daily.    [provider]  buPROPion (WELLBUTRIN XL) 150 MG 24 hr tablet Take 150 mg by mouth at bedtime.  08/29/12   [provider]  Cholecalciferol (VITAMIN D) 50 MCG (2000 UT) CAPS Take 2,000 Units by mouth daily.     [provider]  feeding supplement (ENSURE ENLIVE / ENSURE PLUS) LIQD Take 237 mLs by mouth 2 (two) times daily between meals. 05/06/23   Marguerita Merles Latif, DO  ferrous sulfate 325 (65 FE) MG EC tablet Take 325 mg by mouth daily with breakfast.    [provider]  glipiZIDE (GLUCOTROL) 5 MG tablet Take 5 mg by mouth 2 (two) times daily. 05/05/22   [provider]  hydrALAZINE (APRESOLINE) 25 MG tablet TAKE 1 TABLET BY MOUTH IN THE MORNING AND AT BEDTIME. MAY TAKE ADDITIONAL AS NEEDED FOR SYSTOLIC BLOOD PRESSURE CONSISTENTLY ABOVE 140.  04/24/23   Branch, Dorothe Pea, MD  metoprolol succinate (TOPROL-XL) 100 MG 24 hr tablet TAKE 1 TABLET BY MOUTH EVERY DAY TAKE WITH MEALS OR IMMEDIATELY AFTER A MEAL 05/08/23   Antoine Poche, MD  Omega-3 Fatty Acids (FISH OIL) 1000 MG CAPS Take 1,000 mg by mouth 2 (two) times daily. Afternoon & bedtime.    [provider]  omeprazole (PRILOSEC) 20 MG capsule TAKE ONE CAPSULE BY MOUTH DAILY. NEEDS OFFICE VISIT FOR FURTHER REFILLS. 01/24/22   Carlan, Chelsea L, NP  pioglitazone (ACTOS) 30 MG tablet Take 30 mg by mouth daily. 10/28/17   [provider]  senna-docusate (SENOKOT-S) 8.6-50 MG tablet Take 2 tablets by mouth at bedtime as needed for mild constipation. 05/05/23   Marguerita Merles Latif, DO  sertraline (ZOLOFT) 100 MG tablet Take 100 mg by mouth at bedtime.  05/09/15   [provider]  sodium bicarbonate 650 MG tablet Take 650 mg by mouth 2 (two) times daily. 07/18/17   [provider]  TOUJEO MAX SOLOSTAR 300 UNIT/ML Solostar Pen Inject 20 Units into the skin daily. 03/04/23   Vassie Loll, MD  ULORIC 80 MG TABS Take 80 mg by mouth daily.  04/21/15   [provider]      Allergies    Ivp dye [iodinated contrast media], Penicillins, and Tetanus toxoids    Review of Systems   Review  of Systems  Physical Exam Updated Vital Signs BP (!) 184/75   Pulse 94   Temp 98.5 F (36.9 C) (Oral)   Resp 18   SpO2 98%  Physical Exam Vitals and nursing note reviewed.  Constitutional:      General: He is not in acute distress.    Appearance: He is well-developed.  HENT:     Head: Normocephalic and atraumatic.     Mouth/Throat:     Mouth: Mucous membranes are moist.  Eyes:     General: Vision grossly intact. Gaze aligned appropriately.     Extraocular Movements: Extraocular movements intact.     Conjunctiva/sclera: Conjunctivae normal.  Cardiovascular:     Rate and Rhythm: Normal rate and regular rhythm.     Pulses: Normal pulses.     Heart sounds:  Normal heart sounds, S1 normal and S2 normal. No murmur heard.    No friction rub. No gallop.  Pulmonary:     Effort: Pulmonary effort is normal. No respiratory distress.     Breath sounds: Normal breath sounds.  Abdominal:     Palpations: Abdomen is soft.     Tenderness: There is no abdominal tenderness. There is no guarding or rebound.     Hernia: No hernia is present.  Musculoskeletal:        General: No swelling.     Cervical back: Full passive range of motion without pain, normal range of motion and neck supple. No pain with movement, spinous process tenderness or muscular tenderness. Normal range of motion.     Right lower leg: No edema.     Left lower leg: No edema.  Skin:    General: Skin is warm and dry.     Capillary Refill: Capillary refill takes less than 2 seconds.     Findings: No ecchymosis, erythema, lesion or wound.  Neurological:     Mental Status: He is alert and oriented to person, place, and time.     GCS: GCS eye subscore is 4. GCS verbal subscore is 5. GCS motor subscore is 6.     Cranial Nerves: Cranial nerves 2-12 are intact.     Sensory: Sensation is intact.     Motor: Motor function is intact. No weakness or abnormal muscle tone.     Coordination: Coordination is intact.  Psychiatric:        Mood and Affect: Mood normal.        Speech: Speech normal.        Behavior: Behavior normal.     ED Results / Procedures / Treatments   Labs (all labs ordered are listed, but only abnormal results are displayed) Labs Reviewed  BASIC METABOLIC PANEL - Abnormal; Notable for the following components:      Result Value   Glucose, Bld 208 (*)    BUN 30 (*)    Creatinine, Ser 1.34 (*)    GFR, Estimated 56 (*)    All other components within normal limits  CBC WITH DIFFERENTIAL/PLATELET  TROPONIN I (HIGH SENSITIVITY)  TROPONIN I (HIGH SENSITIVITY)    EKG EKG Interpretation Date/Time:  Tuesday May 09 2023 04:17:16 EDT Ventricular Rate:  100 PR  Interval:  129 QRS Duration:  100 QT Interval:  361 QTC Calculation: 466 R Axis:   -10  Text Interpretation: Sinus tachycardia Abnormal R-wave progression, early transition Nonspecific repol abnormality, lateral leads No significant change since last tracing Confirmed by Gilda Crease [phone]) on 05/09/2023 4:20:56 AM  Radiology DG Chest Port 1  View Result Date: 05/09/2023 CLINICAL DATA:  Chest pain and headache EXAM: PORTABLE CHEST 1 VIEW COMPARISON:  05/03/2023 FINDINGS: Artifact from EKG leads. Normal heart size and mediastinal contours. No acute infiltrate or edema. No effusion or pneumothorax. No acute osseous findings. IMPRESSION: No active disease. Electronically Signed   By: Tiburcio Pea M.D.   On: 05/09/2023 06:11   CT HEAD WO CONTRAST ( ) Result Date: 05/09/2023 CLINICAL DATA:  Headache with increasing frequency or severity EXAM: CT HEAD WITHOUT CONTRAST TECHNIQUE: Contiguous axial images were obtained from the base of the skull through the vertex without intravenous contrast. RADIATION DOSE REDUCTION: This exam was performed according to the departmental dose-optimization program which includes automated exposure control, adjustment of the mA and/or kV according to patient size and/or use of iterative reconstruction technique. COMPARISON:  Head CT from 4 days ago FINDINGS: Brain: No evidence of acute infarction, hemorrhage, hydrocephalus, extra-axial collection or mass lesion/mass effect. High-density at the left centrum semiovale with calcific features today, although there is adjacent signal abnormality by FLAIR imaging which could be gliosis or low-grade edema. Mild cerebral volume loss and patchy white matter low-density. Vascular: No hyperdense vessel or unexpected calcification. Skull: Normal. Negative for fracture or focal lesion. Sinuses/Orbits: No acute finding. IMPRESSION: No acute or progressive finding.  No specific cause for headache. Electronically Signed   By:  Tiburcio Pea M.D.   On: 05/09/2023 05:08    Procedures Procedures    Medications Ordered in ED Medications  hydrALAZINE (APRESOLINE) injection 20 mg (20 mg Intravenous Given 05/09/23 0431)    ED Course/ Medical Decision Making/ A&P                                 Medical Decision Making Amount and/or Complexity of Data Reviewed Labs: ordered. Radiology: ordered.  Risk Prescription drug management.   Differential Diagnosis considered includes, but not limited to: STEMI; NSTEMI; myocarditis; pericarditis; pulmonary embolism; aortic dissection; pneumothorax; pneumonia; gastritis; musculoskeletal pain  Patient with chest pain in the setting of elevated blood pressure.  He does have known hypertension.  He reports that he has been taking his medications as prescribed.  Given IV hydralazine with some improvement.  EKG without ischemic changes.  Troponins negative.  Chest x-ray clear.  Patient appropriate for discharge, outpatient follow-up with primary care, take his morning blood pressure medications as prescribed.        Final Clinical Impression(s) / ED Diagnoses Final diagnoses:  Chest pain, unspecified type  Primary hypertension    Rx / DC Orders ED Discharge Orders     None         Gilda Crease, MD 05/09/23 [phone]

## 2023-05-10 ENCOUNTER — Telehealth: Payer: Self-pay

## 2023-05-10 DIAGNOSIS — I639 Cerebral infarction, unspecified: Secondary | ICD-10-CM

## 2023-05-10 NOTE — Patient Outreach (Signed)
 [facility] Stroke Care Coordination Follow Up  05/10/2023 Name:  CAYSEN WHANG MRN:  [id number] DOB:  1950/07/29  Subjective: ALEC JAROS is a 73 y.o. year old male who is a primary care patient of Assunta Found, MD An Emmi alert was received indicating patient responded to questions: Feeling worse overall?. I reached out by phone to follow up on the alert and spoke to  no answer .   Placed call to patient to follow up on red emmi alert. Noted that patient went to the ED for CP and was discharged. Patient did not answer. Will attempt again Care Coordination Interventions:  No, not indicated   Follow up plan:  will call back in 24 hours    Encounter Outcome:  No Answer   Lonia Chimera, RN, BSN, CEN Population Health- Transition of Care Team.  [facility] [phone]

## 2023-05-11 ENCOUNTER — Telehealth: Payer: Self-pay

## 2023-05-11 NOTE — Patient Outreach (Signed)
 [facility] Stroke Care Coordination Follow Up  05/11/2023 Name:  [name] MRN:  [id number] DOB:  30-Jul-1950  Subjective: [name] is a 73 y.o. year old male who is a primary care patient of Assunta Found, MD An Emmi alert was received indicating patient responded to questions: Feeling worse overall?. I reached out by phone to follow up on the alert and spoke to POA. [name])  Care Coordination Interventions:  Yes, provided   Interventions Today    Flowsheet Row Most Recent Value  Chronic Disease   Chronic disease during today's visit Other  [CVA]  General Interventions   General Interventions Discussed/Reviewed General Interventions Discussed, General Interventions Reviewed  [reviewed with HCPOA [name] ( On DPR) reason for emmi calls and patients response that triggered my call. Ms. Leavy Cella states patient was having a headache and CP, went to the ED and was discharged home.]        Follow up plan: No further intervention required.   Encounter Outcome:  Patient Visit Completed   Lonia Chimera, RN, BSN, CEN Population Health- Transition of Care Team.  [facility] [phone]

## 2023-05-11 NOTE — Patient Outreach (Signed)
 [facility] Stroke Care Coordination Follow Up  05/11/2023 Name:  SAMIT SYLVE MRN:  [id number] DOB:  10-Dec-1950  Subjective: TYLER CUBIT is a 73 y.o. year old male who is a primary care patient of Assunta Found, MD An Emmi alert was received indicating patient responded to questions: Feeling worse overall?. I reached out by phone to follow up on the alert and spoke to  no answer .  Care Coordination Interventions:  No, not indicated   Follow up plan:  will call back in 24 hours    Encounter Outcome:  No Answer   Lonia Chimera, RN, BSN, CEN Population Health- Transition of Care Team.  [facility] [phone]

## 2023-06-03 ENCOUNTER — Encounter (HOSPITAL_COMMUNITY): Payer: Self-pay

## 2023-06-03 ENCOUNTER — Emergency Department (HOSPITAL_COMMUNITY)
Admission: EM | Admit: 2023-06-03 | Discharge: 2023-06-04 | Disposition: A | Discharge status: Home / Self Care | Attending: Emergency Medicine | Admitting: Emergency Medicine

## 2023-06-03 ENCOUNTER — Other Ambulatory Visit: Payer: Self-pay

## 2023-06-03 ENCOUNTER — Emergency Department (HOSPITAL_COMMUNITY)

## 2023-06-03 DIAGNOSIS — R0689 Other abnormalities of breathing: Secondary | ICD-10-CM | POA: Diagnosis not present

## 2023-06-03 DIAGNOSIS — N200 Calculus of kidney: Secondary | ICD-10-CM | POA: Diagnosis not present

## 2023-06-03 DIAGNOSIS — K5730 Diverticulosis of large intestine without perforation or abscess without bleeding: Secondary | ICD-10-CM | POA: Diagnosis not present

## 2023-06-03 DIAGNOSIS — R109 Unspecified abdominal pain: Secondary | ICD-10-CM | POA: Diagnosis not present

## 2023-06-03 DIAGNOSIS — E119 Type 2 diabetes mellitus without complications: Secondary | ICD-10-CM | POA: Diagnosis not present

## 2023-06-03 DIAGNOSIS — Z7984 Long term (current) use of oral hypoglycemic drugs: Secondary | ICD-10-CM | POA: Diagnosis not present

## 2023-06-03 DIAGNOSIS — Z794 Long term (current) use of insulin: Secondary | ICD-10-CM | POA: Insufficient documentation

## 2023-06-03 DIAGNOSIS — R471 Dysarthria and anarthria: Secondary | ICD-10-CM | POA: Diagnosis not present

## 2023-06-03 DIAGNOSIS — M545 Low back pain, unspecified: Secondary | ICD-10-CM | POA: Diagnosis not present

## 2023-06-03 DIAGNOSIS — Z79899 Other long term (current) drug therapy: Secondary | ICD-10-CM | POA: Insufficient documentation

## 2023-06-03 DIAGNOSIS — R6889 Other general symptoms and signs: Secondary | ICD-10-CM | POA: Diagnosis not present

## 2023-06-03 DIAGNOSIS — N3000 Acute cystitis without hematuria: Secondary | ICD-10-CM | POA: Diagnosis not present

## 2023-06-03 DIAGNOSIS — I10 Essential (primary) hypertension: Secondary | ICD-10-CM | POA: Insufficient documentation

## 2023-06-03 DIAGNOSIS — R000 Tachycardia, unspecified: Secondary | ICD-10-CM | POA: Diagnosis not present

## 2023-06-03 DIAGNOSIS — I499 Cardiac arrhythmia, unspecified: Secondary | ICD-10-CM | POA: Diagnosis not present

## 2023-06-03 DIAGNOSIS — N281 Cyst of kidney, acquired: Secondary | ICD-10-CM | POA: Diagnosis not present

## 2023-06-03 DIAGNOSIS — Z743 Need for continuous supervision: Secondary | ICD-10-CM | POA: Diagnosis not present

## 2023-06-03 LAB — CBC WITH DIFFERENTIAL/PLATELET
Abs Immature Granulocytes: 0.04 10*3/uL (ref 0.00–0.07)
Basophils Absolute: 0 10*3/uL (ref 0.0–0.1)
Basophils Relative: 0 %
Eosinophils Absolute: 0.1 10*3/uL (ref 0.0–0.5)
Eosinophils Relative: 1 %
HCT: 39.3 % (ref 39.0–52.0)
Hemoglobin: 13.4 g/dL (ref 13.0–17.0)
Immature Granulocytes: 1 %
Lymphocytes Relative: 10 %
Lymphs Abs: 0.8 10*3/uL (ref 0.7–4.0)
MCH: 29.8 pg (ref 26.0–34.0)
MCHC: 34.1 g/dL (ref 30.0–36.0)
MCV: 87.5 fL (ref 80.0–100.0)
Monocytes Absolute: 0.7 10*3/uL (ref 0.1–1.0)
Monocytes Relative: 9 %
Neutro Abs: 6.7 10*3/uL (ref 1.7–7.7)
Neutrophils Relative %: 79 %
Platelets: 219 10*3/uL (ref 150–400)
RBC: 4.49 MIL/uL (ref 4.22–5.81)
RDW: 13.6 % (ref 11.5–15.5)
WBC: 8.4 10*3/uL (ref 4.0–10.5)
nRBC: 0 % (ref 0.0–0.2)

## 2023-06-03 LAB — COMPREHENSIVE METABOLIC PANEL WITH GFR
ALT: 22 U/L (ref 0–44)
AST: 20 U/L (ref 15–41)
Albumin: 3.6 g/dL (ref 3.5–5.0)
Alkaline Phosphatase: 57 U/L (ref 38–126)
Anion gap: 7 (ref 5–15)
BUN: 27 mg/dL — ABNORMAL HIGH (ref 8–23)
CO2: 23 mmol/L (ref 22–32)
Calcium: 8.7 mg/dL — ABNORMAL LOW (ref 8.9–10.3)
Chloride: 106 mmol/L (ref 98–111)
Creatinine, Ser: 1.84 mg/dL — ABNORMAL HIGH (ref 0.61–1.24)
GFR, Estimated: 38 mL/min — ABNORMAL LOW (ref >60)
Glucose, Bld: 176 mg/dL — ABNORMAL HIGH (ref 70–99)
Potassium: 3.9 mmol/L (ref 3.5–5.1)
Sodium: 136 mmol/L (ref 135–145)
Total Bilirubin: 1.6 mg/dL — ABNORMAL HIGH (ref 0.0–1.2)
Total Protein: 7 g/dL (ref 6.5–8.1)

## 2023-06-03 LAB — URINALYSIS, ROUTINE W REFLEX MICROSCOPIC
Bacteria, UA: NONE SEEN
Bilirubin Urine: NEGATIVE
Glucose, UA: NEGATIVE mg/dL
Hgb urine dipstick: NEGATIVE
Ketones, ur: 5 mg/dL — AB
Nitrite: NEGATIVE
Protein, ur: 100 mg/dL — AB
Specific Gravity, Urine: 1.018 (ref 1.005–1.030)
pH: 5 (ref 5.0–8.0)

## 2023-06-03 MED ORDER — ACETAMINOPHEN 500 MG PO TABS
1000.0000 mg | ORAL_TABLET | Freq: Once | ORAL | Status: AC
  Administered 2023-06-03: 1000 mg via ORAL
  Filled 2023-06-03: qty 2

## 2023-06-03 MED ORDER — ACETAMINOPHEN 500 MG PO TABS: 1000.0000 mg | ORAL_TABLET | Freq: Three times a day (TID) | ORAL | 0 refills | Status: AC | PRN

## 2023-06-03 MED ORDER — CEPHALEXIN 500 MG PO CAPS: 500.0000 mg | ORAL_CAPSULE | Freq: Four times a day (QID) | ORAL | 0 refills | Status: AC

## 2023-06-03 MED ORDER — MORPHINE SULFATE (PF) 4 MG/ML IV SOLN
4.0000 mg | Freq: Once | INTRAVENOUS | Status: AC
  Administered 2023-06-03: 4 mg via INTRAVENOUS
  Filled 2023-06-03: qty 1

## 2023-06-03 MED ORDER — SODIUM CHLORIDE 0.9 % IV BOLUS
500.0000 mL | Freq: Once | INTRAVENOUS | Status: AC
  Administered 2023-06-03: 500 mL via INTRAVENOUS

## 2023-06-03 MED ORDER — CEPHALEXIN 500 MG PO CAPS
500.0000 mg | ORAL_CAPSULE | Freq: Once | ORAL | Status: AC
  Administered 2023-06-03: 500 mg via ORAL
  Filled 2023-06-03: qty 1

## 2023-06-03 MED ORDER — ONDANSETRON HCL 4 MG/2ML IJ SOLN
4.0000 mg | Freq: Once | INTRAMUSCULAR | Status: AC
  Administered 2023-06-03: 4 mg via INTRAVENOUS
  Filled 2023-06-03: qty 2

## 2023-06-03 NOTE — ED Provider Notes (Signed)
  Physical Exam  BP 138/64   Pulse (!) 101   Temp 99.3 F (37.4 C) (Oral)   Resp 16   Ht 6\' 5"  (1.956 m)   Wt 103.2 kg   SpO2 94%   BMI 26.99 kg/m   Physical Exam  Procedures  Procedures  ED Course / MDM    Medical Decision Making Amount and/or Complexity of Data Reviewed Labs: ordered. Radiology: ordered.  Risk OTC drugs. Prescription drug management.  This patient's care was assumed by me at shift change. The tests pending are ct abd/pelvis and UA. Plan is for likely discharge after results.   Patient was re-evaluated by me as well. I discussed their result with them and plan is for Keflex  for possible UTI, Tylenol  for pain, oral hydration at home.  Patient initially tachycardic, improved with IV fluids, not febrile, no leukocytosis. Urine was sent for culture.        Joshua Nieves 06/03/23 2337    Early Glisson, MD 06/04/23 [phone]

## 2023-06-03 NOTE — Discharge Instructions (Signed)
 You were seen in the emergency room today for evaluation of pain in your back.  Your CT scan was reassuring, you were dehydrated with slightly worsening kidney function so we gave you IV fluids.  Drink plenty of fluids at home.  It also appears like you might have a UTI.  We sent your urine for culture.  Follow-up closely with your PCP.  Come back to the ER if you have new or worsening symptoms.

## 2023-06-03 NOTE — ED Notes (Addendum)
 Went over d/c paperwork at this time with patient. Pt had no questions, comments or concerns after review and verbally understood them. Went over paperwork with [name] on the phone. [name] will be picking up the pt in about 45 mins,

## 2023-06-03 NOTE — ED Triage Notes (Signed)
 Pt called EMS for left flank pain. No urinary symptoms. Pain started Wednesday. EMS gave a NS fluid, IV 20g L forearm.

## 2023-06-03 NOTE — ED Provider Notes (Signed)
 [place] EMERGENCY DEPARTMENT AT [facility] Provider Note   CSN: [id number] Arrival date & time: 06/03/23  1722     History  Chief Complaint  Patient presents with   Abdominal Pain    [name] is a 73 y.o. male with a history significant for hypertension, GERD, type 2 diabetes, hypercholesterolemia, renal insufficiency, chronic osteomyelitis of the right foot who also had a subacute hemorrhagic stroke for which she was admitted to [facility] last month, also reporting distant history of kidney stone when he was a teenager presenting for left flank pain which started suddenly on Wednesday, 4 days ago.  He denies injury or fall.  His pain is nearly constant, but is worsened with movement and better when he is lying on his left side.  He denies dysuria or hematuria but pain does radiate around his left flank into his mid left abdomen.  He denies fevers or chills, nausea or vomiting.   The history is provided by the patient.       Home Medications Prior to Admission medications   Medication Sig Start Date End Date Taking? Authorizing Provider  ACCU-CHEK GUIDE test strip USE AS DIRECTED TO TEST TWICE DAILY 02/01/22   [provider]  acetaminophen  (TYLENOL ) 500 MG tablet Take 500-1,000 mg by mouth every 6 (six) hours as needed for mild pain or headache. Pain    [provider]  atorvastatin  (LIPITOR) 10 MG tablet Take 10 mg by mouth at bedtime. 09/21/17   [provider]  B Complex-C (B-COMPLEX WITH VITAMIN C) tablet Take 1 tablet by mouth daily.    [provider]  buPROPion  (WELLBUTRIN  XL) 150 MG 24 hr tablet Take 150 mg by mouth at bedtime.  08/29/12   [provider]  Cholecalciferol  (VITAMIN D ) 50 MCG (2000 UT) CAPS Take 2,000 Units by mouth daily.     [provider]  feeding supplement (ENSURE ENLIVE / ENSURE PLUS) LIQD Take 237 mLs by mouth 2 (two) times daily between meals. 05/06/23   Aura Leeds Latif, DO   ferrous sulfate  325 (65 FE) MG EC tablet Take 325 mg by mouth daily with breakfast.    [provider]  glipiZIDE (GLUCOTROL) 5 MG tablet Take 5 mg by mouth 2 (two) times daily. 05/05/22   [provider]  hydrALAZINE  (APRESOLINE ) 25 MG tablet TAKE 1 TABLET BY MOUTH IN THE MORNING AND AT BEDTIME. MAY TAKE ADDITIONAL AS NEEDED FOR SYSTOLIC BLOOD PRESSURE CONSISTENTLY ABOVE 140. 04/24/23   Branch, Joyceann No, MD  metoprolol  succinate (TOPROL -XL) 100 MG 24 hr tablet TAKE 1 TABLET BY MOUTH EVERY DAY TAKE WITH MEALS OR IMMEDIATELY AFTER A MEAL 05/08/23   Laurann Pollock, MD  Omega-3 Fatty Acids (FISH OIL) 1000 MG CAPS Take 1,000 mg by mouth 2 (two) times daily. Afternoon & bedtime.    [provider]  omeprazole  (PRILOSEC) 20 MG capsule TAKE ONE CAPSULE BY MOUTH DAILY. NEEDS OFFICE VISIT FOR FURTHER REFILLS. 01/24/22   Carlan, Chelsea L, NP  pioglitazone (ACTOS) 30 MG tablet Take 30 mg by mouth daily. 10/28/17   [provider]  senna-docusate (SENOKOT-S) 8.6-50 MG tablet Take 2 tablets by mouth at bedtime as needed for mild constipation. 05/05/23   Aura Leeds Latif, DO  sertraline  (ZOLOFT ) 100 MG tablet Take 100 mg by mouth at bedtime.  05/09/15   [provider]  sodium bicarbonate  650 MG tablet Take 650 mg by mouth 2 (two) times daily. 07/18/17   [provider]  TOUJEO  MAX SOLOSTAR 300 UNIT/ML Solostar Pen Inject 20 Units into the skin daily. 03/04/23   Justina Oman, MD  ULORIC  80 MG TABS Take 80 mg by mouth daily.  04/21/15   [provider]      Allergies    Ivp dye [iodinated contrast media], Penicillins, and Tetanus toxoids    Review of Systems   Review of Systems  Constitutional:  Negative for chills and fever.  HENT:  Negative for congestion.   Eyes: Negative.   Respiratory:  Negative for chest tightness and shortness of breath.   Cardiovascular:  Negative for chest pain.  Gastrointestinal:  Positive for abdominal pain.  Negative for nausea and vomiting.  Genitourinary:  Positive for flank pain. Negative for dysuria and hematuria.  Musculoskeletal:  Negative for arthralgias, joint swelling and neck pain.  Skin: Negative.  Negative for rash and wound.  Neurological:  Negative for dizziness, weakness, light-headedness, numbness and headaches.  Psychiatric/Behavioral: Negative.      Physical Exam Updated Vital Signs BP 119/70 (BP Location: Right Arm)   Pulse (!) 114   Temp 98.7 F (37.1 C) (Oral)   Resp 16   Ht 6\' 5"  (1.956 m)   Wt 103.2 kg   SpO2 94%   BMI 26.99 kg/m  Physical Exam Vitals and nursing note reviewed.  Constitutional:      Appearance: He is well-developed.  HENT:     Head: Normocephalic and atraumatic.  Eyes:     Conjunctiva/sclera: Conjunctivae normal.  Cardiovascular:     Rate and Rhythm: Normal rate and regular rhythm.     Heart sounds: Normal heart sounds.  Pulmonary:     Effort: Pulmonary effort is normal.     Breath sounds: Normal breath sounds. No wheezing.  Abdominal:     General: Bowel sounds are normal.     Palpations: Abdomen is soft.     Tenderness: There is no abdominal tenderness. There is left CVA tenderness. There is no right CVA tenderness, guarding or rebound.     Hernia: No hernia is present.     Comments: Ttp left cva through left paralumbar soft tissue.    Musculoskeletal:        General: Normal range of motion.     Cervical back: Normal range of motion.  Skin:    General: Skin is warm and dry.  Neurological:     Mental Status: He is alert and oriented to person, place, and time.     Cranial Nerves: Dysarthria present.     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     ED Results / Procedures / Treatments   Labs (all labs ordered are listed, but only abnormal results are displayed) Labs Reviewed  COMPREHENSIVE METABOLIC PANEL WITH GFR - Abnormal; Notable for the following components:      Result Value   Glucose, Bld 176 (*)    BUN 27  (*)    Creatinine, Ser 1.84 (*)    Calcium  8.7 (*)    Total Bilirubin 1.6 (*)    GFR, Estimated 38 (*)    All other components within normal limits  CBC WITH DIFFERENTIAL/PLATELET  URINALYSIS, ROUTINE W REFLEX MICROSCOPIC    EKG None  Radiology No results found.  Procedures Procedures    Medications Ordered in ED Medications  sodium chloride  0.9 % bolus 500 mL (0 mLs Intravenous Stopped 06/03/23 1852)  ondansetron  (ZOFRAN ) injection 4 mg (4 mg Intravenous Given 06/03/23 1813)  morphine  (PF) 4 MG/ML injection  4 mg (4 mg Intravenous Given 06/03/23 1813)    ED Course/ Medical Decision Making/ A&P                                 Medical Decision Making Patient presenting with left flank/low back pain with radiation into his abdomen, favoring musculoskeletal source given it is positional and reproducible, although does have a history of kidney stones.  Labs and imaging are currently pending.  He was given IV morphine  and Zofran , along with IV fluids as he presents with tachycardia with pulse rate of 114, he had received 500 cc of normal saline prior to arrival.  Additional fluids have been ordered.  EKG also ordered.  Patient care assumed by [facility], PA-C.  Amount and/or Complexity of Data Reviewed Labs: ordered. Radiology: ordered.  Risk Prescription drug management.           Final Clinical Impression(s) / ED Diagnoses Final diagnoses:  None    Rx / DC Orders ED Discharge Orders     None         Alyse July 06/03/23 1917    Early Glisson, MD 06/04/23 [phone]

## 2023-06-05 LAB — URINE CULTURE

## 2023-06-28 DIAGNOSIS — M869 Osteomyelitis, unspecified: Secondary | ICD-10-CM | POA: Diagnosis not present

## 2023-06-28 DIAGNOSIS — I10 Essential (primary) hypertension: Secondary | ICD-10-CM | POA: Diagnosis not present

## 2023-06-28 DIAGNOSIS — N1831 Chronic kidney disease, stage 3a: Secondary | ICD-10-CM | POA: Diagnosis not present

## 2023-06-28 DIAGNOSIS — E1165 Type 2 diabetes mellitus with hyperglycemia: Secondary | ICD-10-CM | POA: Diagnosis not present

## 2023-06-28 DIAGNOSIS — N390 Urinary tract infection, site not specified: Secondary | ICD-10-CM | POA: Diagnosis not present

## 2023-07-04 ENCOUNTER — Encounter: Admitting: Vascular Surgery

## 2023-07-06 ENCOUNTER — Encounter: Admitting: Vascular Surgery

## 2023-08-07 DIAGNOSIS — I10 Essential (primary) hypertension: Secondary | ICD-10-CM | POA: Diagnosis not present

## 2023-08-07 DIAGNOSIS — S98111S Complete traumatic amputation of right great toe, sequela: Secondary | ICD-10-CM | POA: Diagnosis not present

## 2023-08-07 DIAGNOSIS — E1165 Type 2 diabetes mellitus with hyperglycemia: Secondary | ICD-10-CM | POA: Diagnosis not present

## 2023-08-07 DIAGNOSIS — N1831 Chronic kidney disease, stage 3a: Secondary | ICD-10-CM | POA: Diagnosis not present

## 2023-08-07 DIAGNOSIS — E109 Type 1 diabetes mellitus without complications: Secondary | ICD-10-CM | POA: Diagnosis not present

## 2023-11-03 ENCOUNTER — Other Ambulatory Visit: Payer: Self-pay | Admitting: Nurse Practitioner

## 2023-11-29 ENCOUNTER — Encounter (INDEPENDENT_AMBULATORY_CARE_PROVIDER_SITE_OTHER): Payer: Self-pay | Admitting: Gastroenterology

## 2023-12-18 ENCOUNTER — Encounter: Payer: Self-pay | Admitting: Radiology

## 2024-01-06 IMAGING — DX DG CHEST 1V PORT
1 series · 2 of 2 positions shown · non-contrast
Comparison: 06/02/2010

CLINICAL DATA: Pain left arm

EXAM:
PORTABLE CHEST 1 VIEW

[Series 1: chest ap · 0.14mm/px · 2 of 2 slices shown]
[im 1/2]
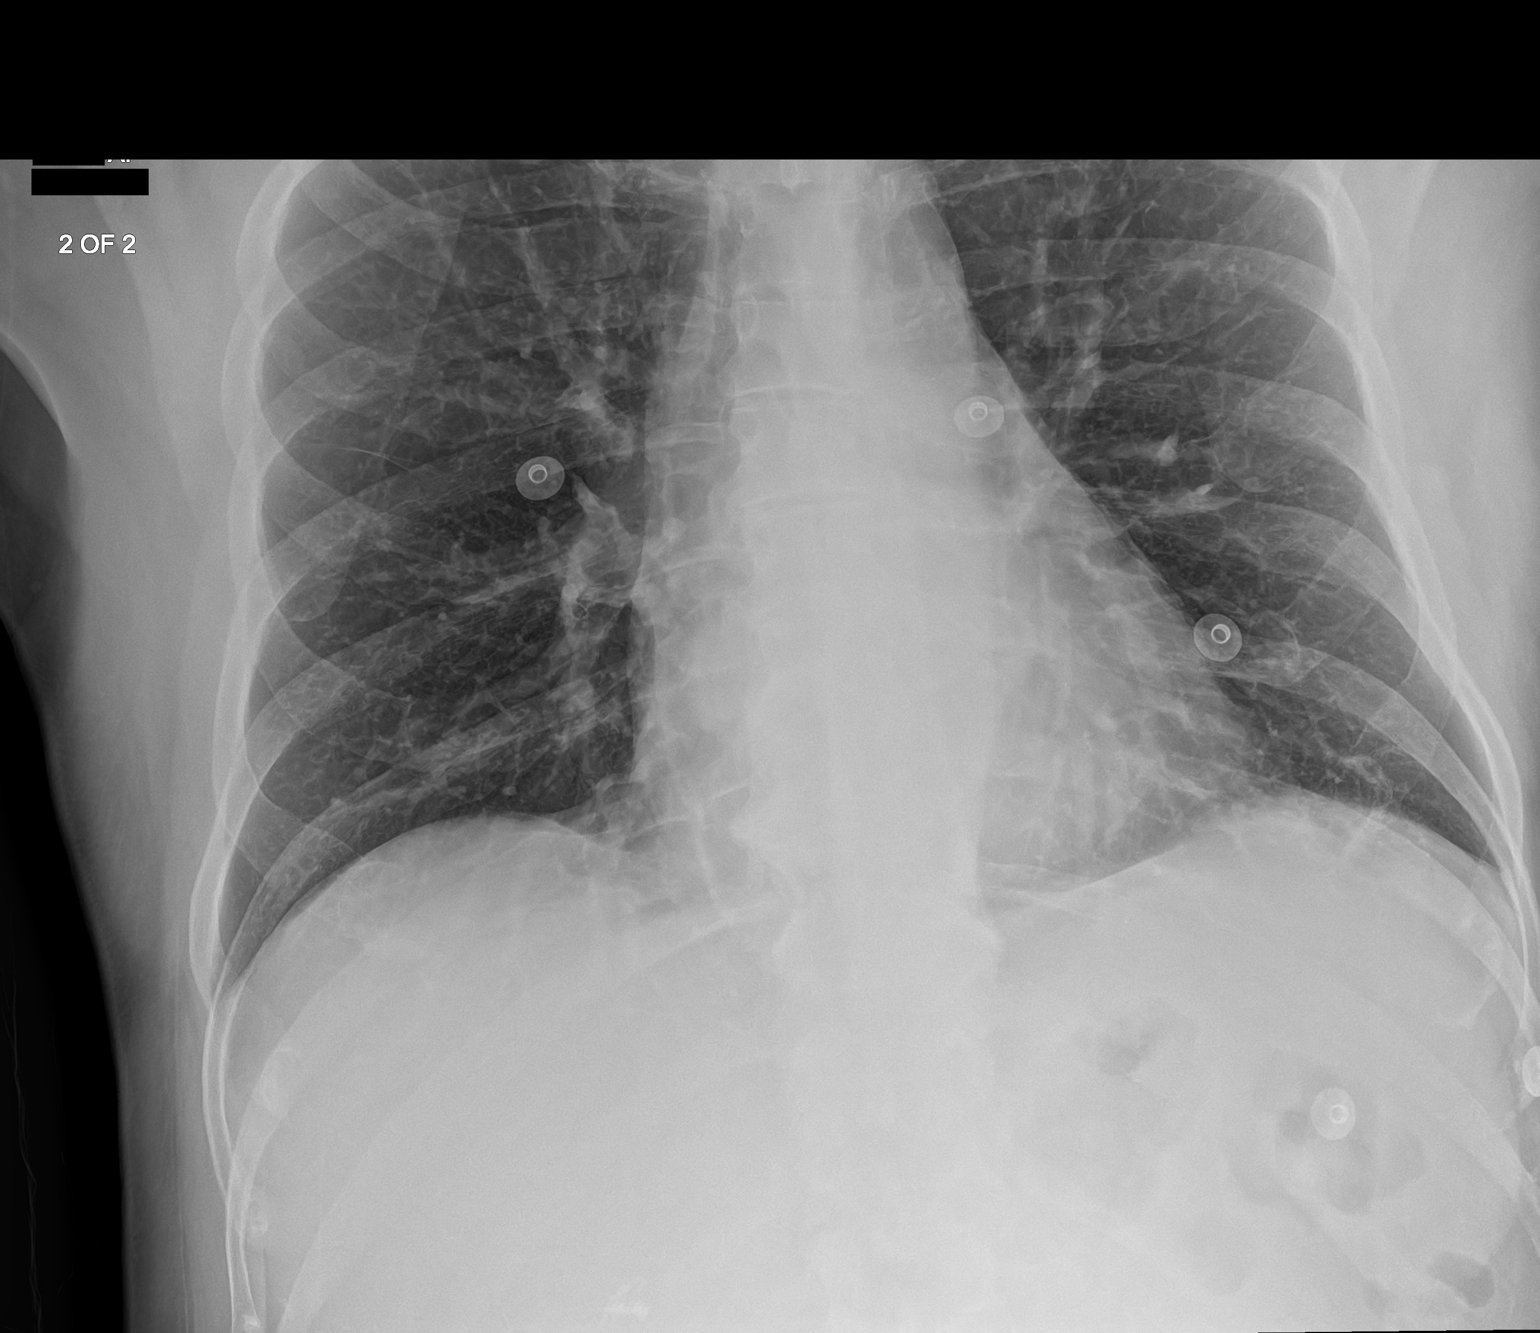
[im 2/2]
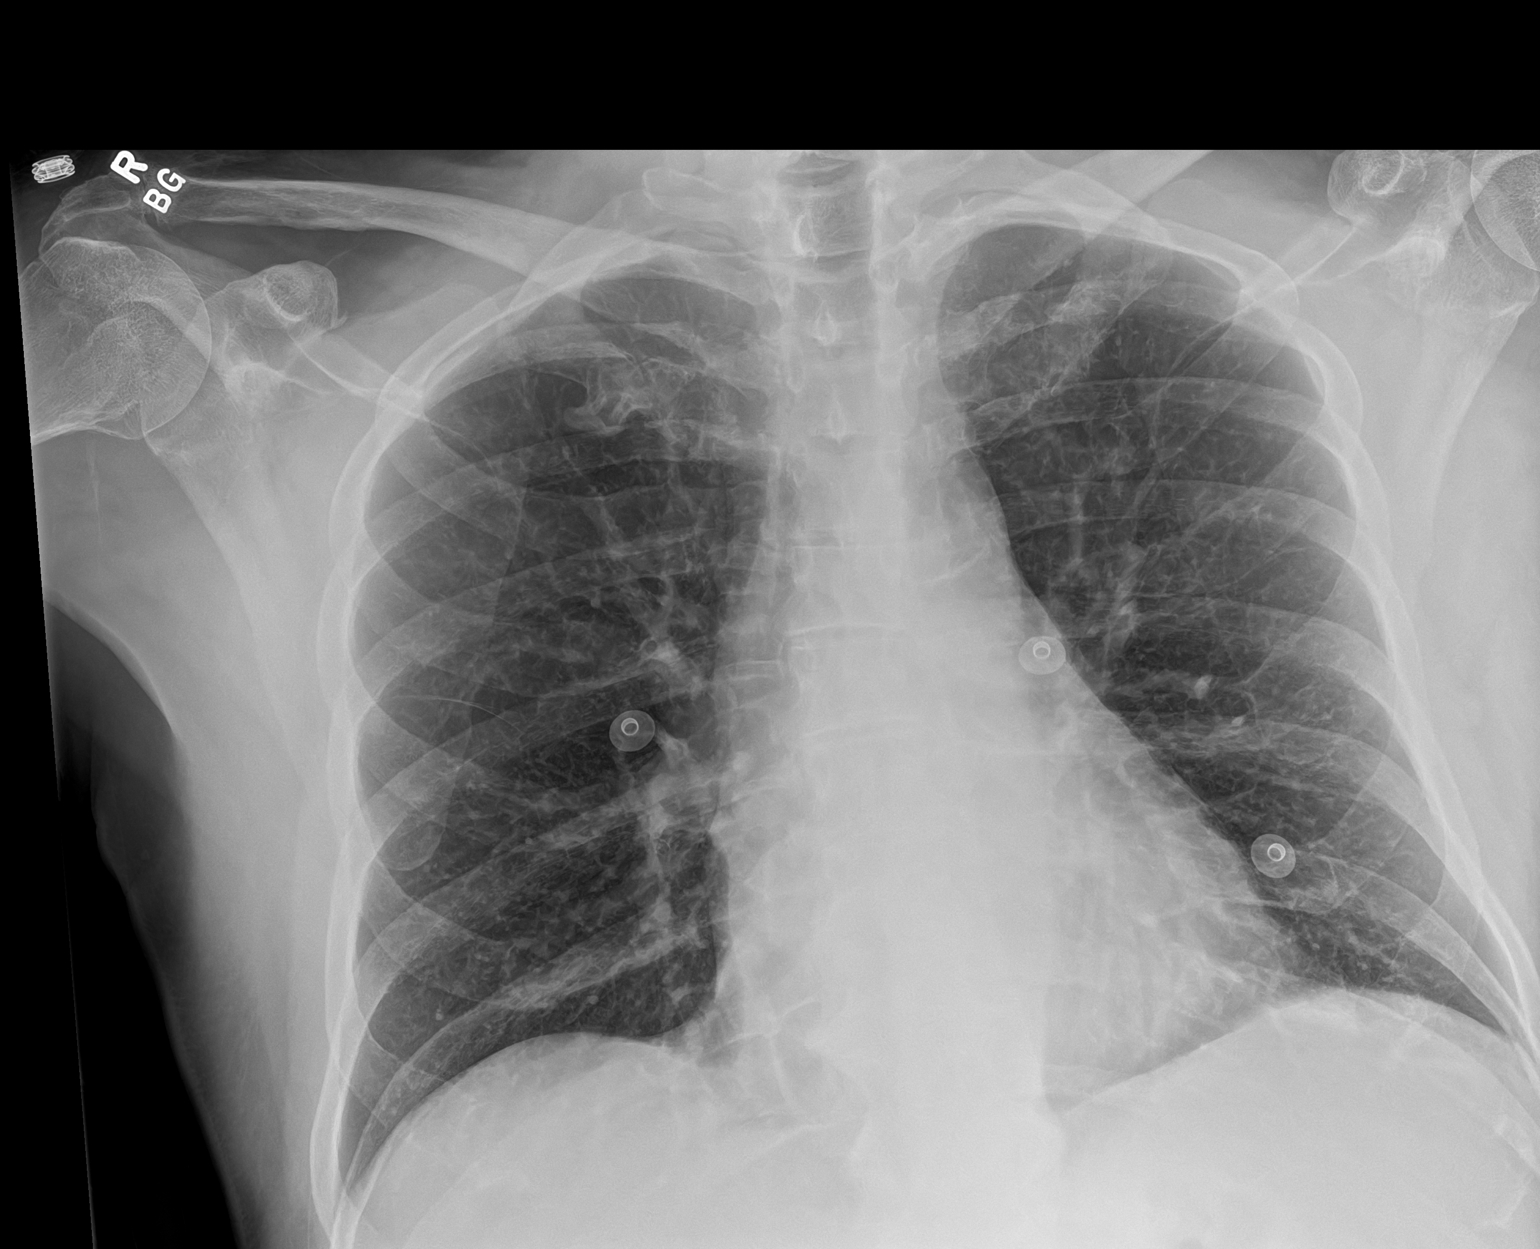

[2 of 2 positions shown; findings below may reference images not displayed]

FINDINGS: The heart size and mediastinal contours are within normal limits.
Both lungs are clear. The visualized skeletal structures are
unremarkable.
IMPRESSION: No active disease.

## 2024-01-16 ENCOUNTER — Emergency Department (HOSPITAL_COMMUNITY)

## 2024-01-16 ENCOUNTER — Inpatient Hospital Stay (HOSPITAL_COMMUNITY)

## 2024-01-16 ENCOUNTER — Other Ambulatory Visit: Payer: Self-pay

## 2024-01-16 ENCOUNTER — Inpatient Hospital Stay (HOSPITAL_COMMUNITY)
Admission: EM | Admit: 2024-01-16 | Discharge: 2024-01-24 | DRG: 474 | Disposition: A | Discharge status: Skilled Nursing Facility | Attending: Internal Medicine | Admitting: Internal Medicine

## 2024-01-16 ENCOUNTER — Encounter (HOSPITAL_COMMUNITY): Payer: Self-pay | Admitting: Emergency Medicine

## 2024-01-16 DIAGNOSIS — F418 Other specified anxiety disorders: Secondary | ICD-10-CM

## 2024-01-16 DIAGNOSIS — N1832 Chronic kidney disease, stage 3b: Secondary | ICD-10-CM | POA: Diagnosis present

## 2024-01-16 DIAGNOSIS — F419 Anxiety disorder, unspecified: Secondary | ICD-10-CM | POA: Diagnosis present

## 2024-01-16 DIAGNOSIS — M86671 Other chronic osteomyelitis, right ankle and foot: Secondary | ICD-10-CM | POA: Diagnosis present

## 2024-01-16 DIAGNOSIS — L03115 Cellulitis of right lower limb: Secondary | ICD-10-CM | POA: Diagnosis present

## 2024-01-16 DIAGNOSIS — E11621 Type 2 diabetes mellitus with foot ulcer: Secondary | ICD-10-CM | POA: Diagnosis present

## 2024-01-16 DIAGNOSIS — E1169 Type 2 diabetes mellitus with other specified complication: Secondary | ICD-10-CM | POA: Diagnosis present

## 2024-01-16 DIAGNOSIS — M1A09X0 Idiopathic chronic gout, multiple sites, without tophus (tophi): Secondary | ICD-10-CM | POA: Diagnosis not present

## 2024-01-16 DIAGNOSIS — N179 Acute kidney failure, unspecified: Secondary | ICD-10-CM | POA: Diagnosis not present

## 2024-01-16 DIAGNOSIS — F32A Depression, unspecified: Secondary | ICD-10-CM | POA: Diagnosis present

## 2024-01-16 DIAGNOSIS — I10 Essential (primary) hypertension: Secondary | ICD-10-CM | POA: Diagnosis not present

## 2024-01-16 DIAGNOSIS — Z79899 Other long term (current) drug therapy: Secondary | ICD-10-CM

## 2024-01-16 DIAGNOSIS — M869 Osteomyelitis, unspecified: Secondary | ICD-10-CM | POA: Diagnosis present

## 2024-01-16 DIAGNOSIS — Z887 Allergy status to serum and vaccine status: Secondary | ICD-10-CM

## 2024-01-16 DIAGNOSIS — Z880 Allergy status to penicillin: Secondary | ICD-10-CM

## 2024-01-16 DIAGNOSIS — E440 Moderate protein-calorie malnutrition: Secondary | ICD-10-CM | POA: Diagnosis present

## 2024-01-16 DIAGNOSIS — Z6826 Body mass index (BMI) 26.0-26.9, adult: Secondary | ICD-10-CM

## 2024-01-16 DIAGNOSIS — M86171 Other acute osteomyelitis, right ankle and foot: Secondary | ICD-10-CM | POA: Diagnosis present

## 2024-01-16 DIAGNOSIS — Z7984 Long term (current) use of oral hypoglycemic drugs: Secondary | ICD-10-CM

## 2024-01-16 DIAGNOSIS — E869 Volume depletion, unspecified: Secondary | ICD-10-CM | POA: Diagnosis not present

## 2024-01-16 DIAGNOSIS — Z89431 Acquired absence of right foot: Secondary | ICD-10-CM

## 2024-01-16 DIAGNOSIS — E7800 Pure hypercholesterolemia, unspecified: Secondary | ICD-10-CM | POA: Diagnosis present

## 2024-01-16 DIAGNOSIS — L97516 Non-pressure chronic ulcer of other part of right foot with bone involvement without evidence of necrosis: Secondary | ICD-10-CM | POA: Diagnosis present

## 2024-01-16 DIAGNOSIS — T8743 Infection of amputation stump, right lower extremity: Principal | ICD-10-CM | POA: Diagnosis present

## 2024-01-16 DIAGNOSIS — E1121 Type 2 diabetes mellitus with diabetic nephropathy: Secondary | ICD-10-CM | POA: Diagnosis present

## 2024-01-16 DIAGNOSIS — M79671 Pain in right foot: Secondary | ICD-10-CM | POA: Diagnosis present

## 2024-01-16 DIAGNOSIS — E1122 Type 2 diabetes mellitus with diabetic chronic kidney disease: Secondary | ICD-10-CM | POA: Diagnosis present

## 2024-01-16 DIAGNOSIS — R6520 Severe sepsis without septic shock: Secondary | ICD-10-CM

## 2024-01-16 DIAGNOSIS — I129 Hypertensive chronic kidney disease with stage 1 through stage 4 chronic kidney disease, or unspecified chronic kidney disease: Secondary | ICD-10-CM | POA: Diagnosis present

## 2024-01-16 DIAGNOSIS — I471 Supraventricular tachycardia, unspecified: Secondary | ICD-10-CM | POA: Diagnosis present

## 2024-01-16 DIAGNOSIS — Z91041 Radiographic dye allergy status: Secondary | ICD-10-CM

## 2024-01-16 DIAGNOSIS — N1831 Chronic kidney disease, stage 3a: Secondary | ICD-10-CM | POA: Diagnosis not present

## 2024-01-16 DIAGNOSIS — Z800 Family history of malignant neoplasm of digestive organs: Secondary | ICD-10-CM

## 2024-01-16 DIAGNOSIS — K219 Gastro-esophageal reflux disease without esophagitis: Secondary | ICD-10-CM | POA: Diagnosis not present

## 2024-01-16 DIAGNOSIS — R197 Diarrhea, unspecified: Secondary | ICD-10-CM | POA: Diagnosis present

## 2024-01-16 DIAGNOSIS — Z1152 Encounter for screening for COVID-19: Secondary | ICD-10-CM

## 2024-01-16 DIAGNOSIS — N183 Chronic kidney disease, stage 3 unspecified: Secondary | ICD-10-CM | POA: Diagnosis present

## 2024-01-16 DIAGNOSIS — A419 Sepsis, unspecified organism: Principal | ICD-10-CM | POA: Diagnosis present

## 2024-01-16 DIAGNOSIS — E1165 Type 2 diabetes mellitus with hyperglycemia: Secondary | ICD-10-CM | POA: Diagnosis present

## 2024-01-16 DIAGNOSIS — R262 Difficulty in walking, not elsewhere classified: Secondary | ICD-10-CM | POA: Diagnosis present

## 2024-01-16 DIAGNOSIS — Z89412 Acquired absence of left great toe: Secondary | ICD-10-CM

## 2024-01-16 DIAGNOSIS — E876 Hypokalemia: Secondary | ICD-10-CM | POA: Diagnosis not present

## 2024-01-16 DIAGNOSIS — M109 Gout, unspecified: Secondary | ICD-10-CM | POA: Diagnosis present

## 2024-01-16 LAB — CBC
HCT: 38.4 % — ABNORMAL LOW (ref 39.0–52.0)
Hemoglobin: 12.7 g/dL — ABNORMAL LOW (ref 13.0–17.0)
MCH: 26.9 pg (ref 26.0–34.0)
MCHC: 33.1 g/dL (ref 30.0–36.0)
MCV: 81.4 fL (ref 80.0–100.0)
Platelets: 279 K/uL (ref 150–400)
RBC: 4.72 MIL/uL (ref 4.22–5.81)
RDW: 14.4 % (ref 11.5–15.5)
WBC: 16 K/uL — ABNORMAL HIGH (ref 4.0–10.5)
nRBC: 0 % (ref 0.0–0.2)

## 2024-01-16 LAB — BLOOD GAS, VENOUS
Acid-Base Excess: 4.3 mmol/L — ABNORMAL HIGH (ref 0.0–2.0)
Bicarbonate: 29.8 mmol/L — ABNORMAL HIGH (ref 20.0–28.0)
Drawn by: 63343
O2 Saturation: 14.3 %
Patient temperature: 37.4
pCO2, Ven: 48 mmHg (ref 44–60)
pH, Ven: 7.4 (ref 7.25–7.43)
pO2, Ven: <31 mmHg — CL (ref 32–45)

## 2024-01-16 LAB — URINALYSIS, ROUTINE W REFLEX MICROSCOPIC
Bilirubin Urine: NEGATIVE
Glucose, UA: >=500 mg/dL — AB
Ketones, ur: NEGATIVE mg/dL
Nitrite: NEGATIVE
Protein, ur: >=300 mg/dL — AB
RBC / HPF: >50 RBC/hpf (ref 0–5)
Specific Gravity, Urine: 1.016 (ref 1.005–1.030)
pH: 5 (ref 5.0–8.0)

## 2024-01-16 LAB — LIPASE, BLOOD: Lipase: 40 U/L (ref 11–51)

## 2024-01-16 LAB — COMPREHENSIVE METABOLIC PANEL WITH GFR
ALT: 11 U/L (ref 0–44)
AST: 12 U/L — ABNORMAL LOW (ref 15–41)
Albumin: 4.1 g/dL (ref 3.5–5.0)
Alkaline Phosphatase: 143 U/L — ABNORMAL HIGH (ref 38–126)
Anion gap: 17 — ABNORMAL HIGH (ref 5–15)
BUN: 18 mg/dL (ref 8–23)
CO2: 22 mmol/L (ref 22–32)
Calcium: 10 mg/dL (ref 8.9–10.3)
Chloride: 94 mmol/L — ABNORMAL LOW (ref 98–111)
Creatinine, Ser: 1.83 mg/dL — ABNORMAL HIGH (ref 0.61–1.24)
GFR, Estimated: 39 mL/min — ABNORMAL LOW (ref >60)
Glucose, Bld: 309 mg/dL — ABNORMAL HIGH (ref 70–99)
Potassium: 4.6 mmol/L (ref 3.5–5.1)
Sodium: 133 mmol/L — ABNORMAL LOW (ref 135–145)
Total Bilirubin: 1.1 mg/dL (ref 0.0–1.2)
Total Protein: 8.4 g/dL — ABNORMAL HIGH (ref 6.5–8.1)

## 2024-01-16 LAB — GLUCOSE, CAPILLARY
Glucose-Capillary: 211 mg/dL — ABNORMAL HIGH (ref 70–99)
Glucose-Capillary: 220 mg/dL — ABNORMAL HIGH (ref 70–99)

## 2024-01-16 LAB — MAGNESIUM: Magnesium: 2 mg/dL (ref 1.7–2.4)

## 2024-01-16 LAB — PHOSPHORUS: Phosphorus: 2.3 mg/dL — ABNORMAL LOW (ref 2.5–4.6)

## 2024-01-16 LAB — RESP PANEL BY RT-PCR (RSV, FLU A&B, COVID)  RVPGX2
Influenza A by PCR: NEGATIVE
Influenza B by PCR: NEGATIVE
Resp Syncytial Virus by PCR: NEGATIVE
SARS Coronavirus 2 by RT PCR: NEGATIVE

## 2024-01-16 LAB — TROPONIN T, HIGH SENSITIVITY
Troponin T High Sensitivity: 38 ng/L — ABNORMAL HIGH (ref 0–19)
Troponin T High Sensitivity: 29 ng/L — ABNORMAL HIGH (ref 0–19)

## 2024-01-16 LAB — LACTIC ACID, PLASMA: Lactic Acid, Venous: 1.7 mmol/L (ref 0.5–1.9)

## 2024-01-16 LAB — C-REACTIVE PROTEIN: CRP: 17.9 mg/dL — ABNORMAL HIGH (ref <1.0)

## 2024-01-16 LAB — PROTIME-INR
INR: 1.1 (ref 0.8–1.2)
Prothrombin Time: 14.8 s (ref 11.4–15.2)

## 2024-01-16 LAB — SEDIMENTATION RATE: Sed Rate: 78 mm/h — ABNORMAL HIGH (ref 0–20)

## 2024-01-16 MED ORDER — PANTOPRAZOLE SODIUM 40 MG PO TBEC
40.0000 mg | DELAYED_RELEASE_TABLET | Freq: Every day | ORAL | Status: DC
  Administered 2024-01-16 – 2024-01-24 (×9): 40 mg via ORAL
  Filled 2024-01-16 (×9): qty 1

## 2024-01-16 MED ORDER — INSULIN ASPART 100 UNIT/ML IJ SOLN
0.0000 [IU] | Freq: Every day | INTRAMUSCULAR | Status: DC
  Administered 2024-01-16: 2 [IU] via SUBCUTANEOUS
  Filled 2024-01-16: qty 1

## 2024-01-16 MED ORDER — SODIUM CHLORIDE 0.9 % IV SOLN
1.0000 g | Freq: Once | INTRAVENOUS | Status: AC
  Administered 2024-01-16: 1 g via INTRAVENOUS
  Filled 2024-01-16: qty 10

## 2024-01-16 MED ORDER — VANCOMYCIN HCL IN DEXTROSE 1-5 GM/200ML-% IV SOLN
1000.0000 mg | Freq: Once | INTRAVENOUS | Status: DC
  Filled 2024-01-16: qty 200

## 2024-01-16 MED ORDER — INSULIN GLARGINE-YFGN 100 UNIT/ML ~~LOC~~ SOLN
15.0000 [IU] | Freq: Every day | SUBCUTANEOUS | Status: DC
  Administered 2024-01-16 – 2024-01-23 (×8): 15 [IU] via SUBCUTANEOUS
  Filled 2024-01-16 (×10): qty 0.15

## 2024-01-16 MED ORDER — ONDANSETRON HCL 4 MG/2ML IJ SOLN
4.0000 mg | Freq: Four times a day (QID) | INTRAMUSCULAR | Status: DC | PRN
  Administered 2024-01-16 – 2024-01-23 (×4): 4 mg via INTRAVENOUS
  Filled 2024-01-16 (×4): qty 2

## 2024-01-16 MED ORDER — HEPARIN SODIUM (PORCINE) 5000 UNIT/ML IJ SOLN
5000.0000 [IU] | Freq: Three times a day (TID) | INTRAMUSCULAR | Status: DC
  Administered 2024-01-16 – 2024-01-24 (×22): 5000 [IU] via SUBCUTANEOUS
  Filled 2024-01-16 (×22): qty 1

## 2024-01-16 MED ORDER — SODIUM CHLORIDE 0.9 % IV BOLUS
1000.0000 mL | Freq: Once | INTRAVENOUS | Status: AC
  Administered 2024-01-16: 1000 mL via INTRAVENOUS

## 2024-01-16 MED ORDER — FEBUXOSTAT 40 MG PO TABS
80.0000 mg | ORAL_TABLET | Freq: Every day | ORAL | Status: DC
  Administered 2024-01-16 – 2024-01-24 (×9): 80 mg via ORAL
  Filled 2024-01-16 (×9): qty 2

## 2024-01-16 MED ORDER — ENSURE ENLIVE PO LIQD
237.0000 mL | Freq: Two times a day (BID) | ORAL | Status: DC
  Administered 2024-01-17 – 2024-01-23 (×10): 237 mL via ORAL
  Filled 2024-01-16 (×17): qty 237

## 2024-01-16 MED ORDER — VANCOMYCIN HCL 2000 MG/400ML IV SOLN
2000.0000 mg | Freq: Once | INTRAVENOUS | Status: AC
  Administered 2024-01-16: 2000 mg via INTRAVENOUS
  Filled 2024-01-16: qty 400

## 2024-01-16 MED ORDER — METRONIDAZOLE 500 MG/100ML IV SOLN
500.0000 mg | Freq: Once | INTRAVENOUS | Status: AC
  Administered 2024-01-16: 500 mg via INTRAVENOUS
  Filled 2024-01-16: qty 100

## 2024-01-16 MED ORDER — METOPROLOL SUCCINATE ER 50 MG PO TB24
100.0000 mg | ORAL_TABLET | Freq: Every day | ORAL | Status: DC
  Administered 2024-01-16 – 2024-01-24 (×9): 100 mg via ORAL
  Filled 2024-01-16 (×9): qty 2

## 2024-01-16 MED ORDER — ACETAMINOPHEN 650 MG RE SUPP: 650.0000 mg | Freq: Four times a day (QID) | RECTAL | Status: DC | PRN

## 2024-01-16 MED ORDER — LACTATED RINGERS IV SOLN: INTRAVENOUS | Status: DC

## 2024-01-16 MED ORDER — ACETAMINOPHEN 325 MG PO TABS
650.0000 mg | ORAL_TABLET | Freq: Four times a day (QID) | ORAL | Status: DC | PRN
  Administered 2024-01-16 – 2024-01-18 (×4): 650 mg via ORAL
  Filled 2024-01-16 (×4): qty 2

## 2024-01-16 MED ORDER — OXYCODONE HCL 5 MG PO TABS
5.0000 mg | ORAL_TABLET | Freq: Four times a day (QID) | ORAL | Status: DC | PRN
  Administered 2024-01-21: 5 mg via ORAL
  Filled 2024-01-16: qty 1

## 2024-01-16 MED ORDER — HYDROMORPHONE HCL 1 MG/ML IJ SOLN: 1.0000 mg | INTRAMUSCULAR | Status: DC | PRN

## 2024-01-16 MED ORDER — SODIUM CHLORIDE 0.9 % IV SOLN
2.0000 g | INTRAVENOUS | Status: AC
  Administered 2024-01-17 – 2024-01-20 (×4): 2 g via INTRAVENOUS
  Filled 2024-01-16 (×4): qty 20

## 2024-01-16 MED ORDER — SENNOSIDES-DOCUSATE SODIUM 8.6-50 MG PO TABS: 2.0000 | ORAL_TABLET | Freq: Every evening | ORAL | Status: DC | PRN

## 2024-01-16 MED ORDER — ATORVASTATIN CALCIUM 10 MG PO TABS
10.0000 mg | ORAL_TABLET | Freq: Every day | ORAL | Status: DC
  Administered 2024-01-16 – 2024-01-23 (×8): 10 mg via ORAL
  Filled 2024-01-16 (×8): qty 1

## 2024-01-16 MED ORDER — SODIUM BICARBONATE 650 MG PO TABS
650.0000 mg | ORAL_TABLET | Freq: Two times a day (BID) | ORAL | Status: DC
  Administered 2024-01-16 – 2024-01-24 (×17): 650 mg via ORAL
  Filled 2024-01-16 (×17): qty 1

## 2024-01-16 MED ORDER — SERTRALINE HCL 50 MG PO TABS
100.0000 mg | ORAL_TABLET | Freq: Every day | ORAL | Status: DC
  Administered 2024-01-16 – 2024-01-23 (×8): 100 mg via ORAL
  Filled 2024-01-16 (×4): qty 1
  Filled 2024-01-16: qty 2
  Filled 2024-01-16 (×3): qty 1

## 2024-01-16 MED ORDER — INSULIN ASPART 100 UNIT/ML IJ SOLN
0.0000 [IU] | Freq: Three times a day (TID) | INTRAMUSCULAR | Status: DC
  Administered 2024-01-16 – 2024-01-17 (×2): 5 [IU] via SUBCUTANEOUS
  Administered 2024-01-17: 8 [IU] via SUBCUTANEOUS
  Administered 2024-01-17: 5 [IU] via SUBCUTANEOUS
  Administered 2024-01-18: 3 [IU] via SUBCUTANEOUS
  Administered 2024-01-18 – 2024-01-19 (×4): 2 [IU] via SUBCUTANEOUS
  Administered 2024-01-20: 3 [IU] via SUBCUTANEOUS
  Administered 2024-01-20 (×2): 5 [IU] via SUBCUTANEOUS
  Administered 2024-01-21 (×2): 2 [IU] via SUBCUTANEOUS
  Administered 2024-01-21 – 2024-01-22 (×2): 5 [IU] via SUBCUTANEOUS
  Administered 2024-01-23 (×3): 3 [IU] via SUBCUTANEOUS
  Administered 2024-01-24: 2 [IU] via SUBCUTANEOUS
  Filled 2024-01-16: qty 1
  Filled 2024-01-16: qty 5
  Filled 2024-01-16: qty 3
  Filled 2024-01-16 (×2): qty 2
  Filled 2024-01-16: qty 5
  Filled 2024-01-16: qty 1
  Filled 2024-01-16 (×2): qty 2
  Filled 2024-01-16: qty 3
  Filled 2024-01-16: qty 1
  Filled 2024-01-16: qty 5
  Filled 2024-01-16: qty 2
  Filled 2024-01-16 (×2): qty 3
  Filled 2024-01-16: qty 2
  Filled 2024-01-16: qty 3
  Filled 2024-01-16: qty 5
  Filled 2024-01-16: qty 3

## 2024-01-16 MED ORDER — ONDANSETRON HCL 4 MG PO TABS: 4.0000 mg | ORAL_TABLET | Freq: Four times a day (QID) | ORAL | Status: DC | PRN

## 2024-01-16 MED ORDER — VANCOMYCIN HCL 1500 MG/300ML IV SOLN
1500.0000 mg | INTRAVENOUS | Status: AC
  Administered 2024-01-17 – 2024-01-18 (×2): 1500 mg via INTRAVENOUS
  Filled 2024-01-16 (×2): qty 300

## 2024-01-16 MED ORDER — BUPROPION HCL ER (XL) 150 MG PO TB24
150.0000 mg | ORAL_TABLET | Freq: Every day | ORAL | Status: DC
  Administered 2024-01-16 – 2024-01-23 (×8): 150 mg via ORAL
  Filled 2024-01-16 (×8): qty 1

## 2024-01-16 NOTE — Assessment & Plan Note (Signed)
-   In the setting of right foot cellulitis/most likely osteomyelitis component. - Continue IV broad-spectrum antibiotics as started and follow culture results - MRI has been ordered to rule out deeper infection; case has been discussed with Dr. Harden who expressed the need for patient to go to [facility] in the presence of deep infection/osteomyelitis process. - Continue as needed analgesics and supportive care - IV fluid resuscitation per sepsis protocol has been started. -Will check CRP and ESR.

## 2024-01-16 NOTE — ED Notes (Signed)
 Meds and fluids paused for pt to go to MRI

## 2024-01-16 NOTE — Progress Notes (Signed)
 Pharmacy Antibiotic Note  [name] is a 74 y.o. male admitted on 01/16/2024 with cellulitis and concern for osteomyelitis  Pharmacy has been consulted for Vancomycin  dosing.  Plan: Vancomycin  2000 mg IV loading dose, then 1500 mg IV Q 24 hrs. Goal AUC 400-550. Expected AUC: 491 SCr used: 1.83  Also on ceftriaxone  2gm IV q24h F/U cxs and clinical progress Monitor V/S, labs and levels as indicated  Height: 6' 5 (195.6 cm) Weight: 103 kg (227 lb 1.2 oz) IBW/kg (Calculated) : 89.1  Temp (24hrs), Avg:99.3 F (37.4 C), Min:99.3 F (37.4 C), Max:99.3 F (37.4 C)  Recent Labs  Lab 01/16/24 1037 01/16/24 1100  WBC 16.0*  --   CREATININE 1.83*  --   LATICACIDVEN  --  1.7    Estimated Creatinine Clearance: 46 mL/min (A) (by C-G formula based on SCr of 1.83 mg/dL (H)).    Allergies  Allergen Reactions   Ivp Dye [Iodinated Contrast Media] Other (See Comments)    Sped heart rate up    Penicillins Rash    Pt tolerated amoxicillin  oral challenge   Tetanus Toxoid-Containing Vaccines Rash    Antimicrobials this admission: Vancomycin  12/2 >>  Ceftriaxone   12/2 >>   Microbiology results: 12/2 BCx: pending  Thank you for allowing pharmacy to be a part of this patient's care.  Solimar Maiden, BS Pharm D, BCPS Clinical Pharmacist 01/16/2024 2:00 PM

## 2024-01-16 NOTE — Assessment & Plan Note (Signed)
-   No suicidal ideation or hallucination - Continue home antidepressant/anxiolytic therapy.

## 2024-01-16 NOTE — Assessment & Plan Note (Signed)
-   In the setting of diabetes/hypertension - Renal function appears to be stable - Continue fluid resuscitation's, minimize nephrotoxic agents, avoid the use of contrast/hypotension and continue the use of sodium bicarbonate .

## 2024-01-16 NOTE — Assessment & Plan Note (Signed)
 Continue Uloric

## 2024-01-16 NOTE — Progress Notes (Signed)
 Arrived from Emergency Department to [facility] room 340.  Placed on cardiac monitor and vitals obtained.     01/16/24 1755  Vitals  Temp 99.7 F (37.6 C)  Temp Source Oral  BP (!) 164/72  MAP (mmHg) 99  BP Location Right Arm  BP Method Automatic  Patient Position (if appropriate) Lying  Pulse Rate 87  Pulse Rate Source Dinamap  ECG Heart Rate 91  Resp 18  Level of Consciousness  Level of Consciousness Alert  MEWS COLOR  MEWS Score Color Green  Oxygen Therapy  SpO2 98 %  O2 Device Room Air  Pain Assessment  Pain Scale 0-10  Pain Score 8  Pain Location Head  Pain Descriptors / Indicators Headache  Pain Intervention(s) Relaxation;Repositioned  ECG Monitoring  Telemetry Box Number [id number]  Tele Box Verification Completed by Second Verifier Completed (Terri NT)  MEWS Score  MEWS Temp 0  MEWS Systolic 0  MEWS Pulse 0  MEWS RR 0  MEWS LOC 0  MEWS Score 0

## 2024-01-16 NOTE — Assessment & Plan Note (Signed)
 Continue PPI.

## 2024-01-16 NOTE — ED Provider Notes (Signed)
 [place] EMERGENCY DEPARTMENT AT [facility] Provider Note   CSN: [id number] Arrival date & time: 01/16/24  1024     Patient presents with: Emesis   [name] is a 73 y.o. male.   Patient is a 73 year old male who presents emergency department the chief complaint of generalized weakness, pain to right foot, vomiting which has been ongoing for approximate the past week.  Patient notes that he has worsening pain, redness, swelling to his right foot.  He does live at home alone.  He notes that he has had no associated chest pain, shortness of breath, abdominal pain.  He denies any noted fever or chills at home.  There is been no associated dizziness, lightheadedness or syncope.   Emesis      Prior to Admission medications   Medication Sig Start Date End Date Taking? Authorizing Provider  ACCU-CHEK GUIDE test strip USE AS DIRECTED TO TEST TWICE DAILY 02/01/22   [provider]  acetaminophen  (TYLENOL ) 500 MG tablet Take 2 tablets (1,000 mg total) by mouth every 8 (eight) hours as needed for mild pain (pain score 1-3) or moderate pain (pain score 4-6). Pain 06/03/23   Suellen Cantor A, PA-C  atorvastatin  (LIPITOR) 10 MG tablet Take 10 mg by mouth at bedtime. 09/21/17   [provider]  B Complex-C (B-COMPLEX WITH VITAMIN C) tablet Take 1 tablet by mouth daily.    [provider]  buPROPion  (WELLBUTRIN  XL) 150 MG 24 hr tablet Take 150 mg by mouth at bedtime.  08/29/12   [provider]  Cholecalciferol  (VITAMIN D ) 50 MCG (2000 UT) CAPS Take 2,000 Units by mouth daily.     [provider]  feeding supplement (ENSURE ENLIVE / ENSURE PLUS) LIQD Take 237 mLs by mouth 2 (two) times daily between meals. 05/06/23   Sherrill Cable Latif, DO  ferrous sulfate  325 (65 FE) MG EC tablet Take 325 mg by mouth daily with breakfast.    [provider]  glipiZIDE (GLUCOTROL) 5 MG tablet Take 5 mg by mouth 2 (two) times daily. 05/05/22    [provider]  hydrALAZINE  (APRESOLINE ) 25 MG tablet TAKE 1 TABLET BY MOUTH IN THE MORNING AND AT BEDTIME. MAY TAKE ADDITIONAL AS NEEDED FOR SYSTOLIC BLOOD PRESSURE CONSISTENTLY ABOVE 140. 04/24/23   Branch, Dorn FALCON, MD  metoprolol  succinate (TOPROL -XL) 100 MG 24 hr tablet TAKE 1 TABLET BY MOUTH EVERY DAY TAKE WITH MEALS OR IMMEDIATELY AFTER A MEAL 05/08/23   Alvan Dorn FALCON, MD  Omega-3 Fatty Acids (FISH OIL) 1000 MG CAPS Take 1,000 mg by mouth 2 (two) times daily. Afternoon & bedtime.    [provider]  omeprazole  (PRILOSEC) 20 MG capsule TAKE ONE CAPSULE BY MOUTH DAILY. NEEDS OFFICE VISIT FOR FURTHER REFILLS. 01/24/22   Carlan, Chelsea L, NP  pioglitazone (ACTOS) 30 MG tablet Take 30 mg by mouth daily. 10/28/17   [provider]  senna-docusate (SENOKOT-S) 8.6-50 MG tablet Take 2 tablets by mouth at bedtime as needed for mild constipation. 05/05/23   Sherrill Cable Latif, DO  sertraline  (ZOLOFT ) 100 MG tablet Take 100 mg by mouth at bedtime.  05/09/15   [provider]  sodium bicarbonate  650 MG tablet Take 650 mg by mouth 2 (two) times daily. 07/18/17   [provider]  TOUJEO  MAX SOLOSTAR 300 UNIT/ML Solostar Pen Inject 20 Units into the skin daily. 03/04/23   Ricky Fines, MD  ULORIC  80 MG TABS Take 80 mg by mouth daily.  04/21/15   [provider]    Allergies: Ivp dye [iodinated contrast media], Penicillins, and Tetanus toxoid-containing vaccines    Review of Systems  Constitutional:  Positive for fatigue.  Gastrointestinal:  Positive for vomiting.  Musculoskeletal:        Right foot pain  All other systems reviewed and are negative.   Updated Vital Signs BP (!) 176/64 (BP Location: Right Arm)   Pulse (!) 116   Temp 99.3 F (37.4 C) (Oral)   Resp (!) 22   Ht 6' 5 (1.956 m)   Wt 103 kg   SpO2 99%   BMI 26.93 kg/m   Physical Exam Vitals and nursing note reviewed.  Constitutional:      General: He is not in acute  distress.    Appearance: Normal appearance. He is not ill-appearing.  HENT:     Head: Normocephalic and atraumatic.     Nose: Nose normal.     Mouth/Throat:     Mouth: Mucous membranes are moist.  Eyes:     Extraocular Movements: Extraocular movements intact.     Conjunctiva/sclera: Conjunctivae normal.     Pupils: Pupils are equal, round, and reactive to light.  Cardiovascular:     Rate and Rhythm: Regular rhythm. Tachycardia present.     Pulses: Normal pulses.     Heart sounds: Normal heart sounds. No murmur heard.    No gallop.  Pulmonary:     Effort: Pulmonary effort is normal. No respiratory distress.     Breath sounds: Normal breath sounds. No stridor. No wheezing, rhonchi or rales.  Abdominal:     General: Abdomen is flat. Bowel sounds are normal. There is no distension.     Palpations: Abdomen is soft.     Tenderness: There is no abdominal tenderness. There is no guarding.  Musculoskeletal:        General: Normal range of motion.     Cervical back: Normal range of motion and neck supple.     Comments: Ulcerated lesion noted to the plantar aspect of the right foot, erythema and warmth noted diffusely to right foot with associated edema, peripheral pulses 2+, distal digits of right foot are surgically absent, nontender palpation directly over right ankle, knee or hip  Skin:    General: Skin is warm and dry.     Findings: No rash.  Neurological:     General: No focal deficit present.     Mental Status: He is alert and oriented to person, place, and time. Mental status is at baseline.  Psychiatric:        Mood and Affect: Mood normal.        Behavior: Behavior normal.        Thought Content: Thought content normal.        Judgment: Judgment normal.     (all labs ordered are listed, but only abnormal results are displayed) Labs Reviewed  RESP PANEL BY RT-PCR (RSV, FLU A&B, COVID)  [id number]  CULTURE, BLOOD (ROUTINE X 2)  CULTURE, BLOOD (ROUTINE X 2)  LIPASE, BLOOD   COMPREHENSIVE METABOLIC PANEL WITH GFR  CBC  URINALYSIS, ROUTINE W REFLEX MICROSCOPIC  LACTIC ACID, PLASMA  LACTIC ACID, PLASMA  BLOOD GAS, VENOUS  TROPONIN T, HIGH SENSITIVITY    EKG: None  Radiology: No results found.   .Critical Care  Performed by: Daralene Lonni BIRCH, PA-C Authorized by: Daralene Lonni BIRCH, PA-C   Critical care provider statement:    Critical care time (minutes):  35  Critical care was necessary to treat or prevent imminent or life-threatening deterioration of the following conditions:  Sepsis   Critical care was time spent personally by me on the following activities:  Development of treatment plan with patient or surrogate, discussions with consultants, evaluation of patient's response to treatment, examination of patient, ordering and review of laboratory studies, ordering and review of radiographic studies, ordering and performing treatments and interventions, pulse oximetry, re-evaluation of patient's condition and review of old charts   I assumed direction of critical care for this patient from another provider in my specialty: no     Care discussed with: admitting provider      Medications Ordered in the ED  vancomycin  (VANCOCIN ) IVPB 1000 mg/200 mL premix (has no administration in time range)  cefTRIAXone  (ROCEPHIN ) 1 g in sodium chloride  0.9 % 100 mL IVPB (has no administration in time range)  metroNIDAZOLE  (FLAGYL ) IVPB 500 mg (has no administration in time range)  sodium chloride  0.9 % bolus 1,000 mL (has no administration in time range)                                    Medical Decision Making Amount and/or Complexity of Data Reviewed Labs: ordered. Radiology: ordered.  Risk Prescription drug management. Decision regarding hospitalization.   This patient presents to the ED for concern of pain to right foot, generalized malaise and fatigue, vomiting differential diagnosis includes sepsis, urinary tract infection, pneumonia,  cellulitis, osteomyelitis, necrotizing fasciitis    Additional history obtained:  Additional history obtained from medical records External records from outside source obtained and reviewed including medical records   Lab Tests:  I Ordered, and personally interpreted labs.  The pertinent results include: Leukocytosis, stable anemia, creatinine at baseline, hyperglycemia, unremarkable liver function, unremarkable electrolytes, negative lactic acid, unremarkable VBG, negative lipase, downtrending troponin, negative respiratory panel   Imaging Studies ordered:  I ordered imaging studies including chest x-ray, x-ray of right foot I independently visualized and interpreted imaging which showed no acute cardiopulmonary process, edema to right foot with chronic ulcer I agree with the radiologist interpretation   Medicines ordered and prescription drug management:  I ordered medication including vancomycin , Rocephin , Flagyl , IV fluids for sepsis, cellulitis Reevaluation of the patient after these medicines showed that the patient improved I have reviewed the patients home medicines and have made adjustments as needed   Problem List / ED Course:  Patient is doing well at this time and does remain stable.  Discussed with patient we will plan for admission to the hospital service given his sepsis, cellulitis and possible osteomyelitis.  Have discussed patient case with Dr. Ricky with the hospital service who has decided for admission.  Have avoided full sepsis bolus in this patient as he does have a normal lactic acid and no indication for shock.  Want to avoid fluid overload in this patient as well.  Blood work does demonstrate a leukocytosis and he was tachycardic on presentation with borderline fever.   Social Determinants of Health:  None        Final diagnoses:  None    ED Discharge Orders     None          Daralene Lonni JONETTA DEVONNA 01/16/24 1332    LongFonda MATSU, MD 01/25/24 [phone]

## 2024-01-16 NOTE — Sepsis Progress Note (Signed)
 Sepsis protocol monitored by eLink

## 2024-01-16 NOTE — ED Triage Notes (Signed)
 Pt in by [facility] form home w/ generalized weakness x one week and vomiting x1days. Pt cbg w/ ems was 338.

## 2024-01-16 NOTE — H&P (Signed)
 History and Physical    Patient: [name] FMW:[id number] DOB: 18-Dec-1950 DOA: 01/16/2024 DOS: the patient was seen and examined on 01/16/2024 PCP: Marvine Rush, MD   Patient coming from: Home  Chief Complaint:  Chief Complaint  Patient presents with   Emesis   HPI: [name] is a 73 y.o. 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  Patient has also reported pain, redness and intermittent drainage out of his right foot.  Patient reports no hematemesis, no focal weaknesses, no dysuria/hematuria, no melena, no hematochezia, no chest pain or abdominal pain, no shortness of breath, no frank fever, sick contacts or any other complaints.  Workup in the ED demonstrating concern for early sepsis most likely associated with right foot osteomyelitis.  Patient with low-grade temperature, tachycardic, tachypneic and with elevated WBCs.  Cultures take, IV antibiotics has been started and [facility] contacted to place patient in the hospital for further evaluation and management.   Review of Systems: As mentioned in the history of present illness. All other systems reviewed and are negative.  Past Medical History:  Diagnosis Date   Anemia    Anxiety    Arthritis    Colon polyps    Depression    Diabetes mellitus without complication (HCC)    DM (diabetes mellitus) (HCC) 01/30/2019   Fatty liver    GERD (gastroesophageal reflux disease)    Gout    High cholesterol    History of echocardiogram 09/14/2011   EF >55%; borderline concentric LVH;    History of stress test 06/21/2010    exercise; normal study   Hypertension    MET TEST 08/15/2011   moderate peak VO2 limitation at 66% predicted; mod cardiac impairment with low SV & low anaerobic threshold, mild chronotropic incompetence; low risk   Nausea    Osteomyelitis (HCC)    right great toe   Renal disorder    Renal insufficiency    Speech impediment    Thrombocytopenia    PMH   Past Surgical History:  Procedure Laterality Date   AMPUTATION Right 02/13/2019   Procedure: RIGHT GREAT TOE AMPUTATION;  Surgeon: Harden Jerona GAILS, MD;  Location: [facility] OR;  Service: Orthopedics;  Laterality: Right;   AMPUTATION Right 02/04/2022   Procedure: RIGHT TRANSMETATARSAL AMPUTATION APPLICATION OF WOUND VAC;  Surgeon: Harden Jerona GAILS, MD;  Location: [facility] OR;  Service: Orthopedics;  Laterality: Right;   AMPUTATION TOE Left 12/02/2021   Procedure: AMPUTATION TOE;  Surgeon: Mavis Anes, MD;  Location: [facility] ORS;  Service: General;  Laterality: Left;   AMPUTATION TOE Left 12/06/2021   Procedure: CLOSURE OF LEFT GREAT TOE AMPUTATION;  Surgeon: Mavis Anes, MD;  Location: [facility] ORS;  Service: General;  Laterality: Left;   CHOLECYSTECTOMY     COLONOSCOPY N/A 09/21/2012   Procedure: COLONOSCOPY;  Surgeon: Claudis RAYMOND Rivet, MD;  Location: [facility];  Service: Endoscopy;  Laterality: N/A;  730   COLONOSCOPY N/A 01/30/2019   rehman, eight 4-7 mm polyps in rectum in sigmoid and at splenic flexure, transverse colon and in cecum, one 9mm polyp in recto-sigmoid colon, clip was placed, nine polyps total removed   ESOPHAGOGASTRODUODENOSCOPY  11/2005  normal mucosa and GE junction   HERNIA REPAIR     x 2   HIP SURGERY     Rt hip age 58   NASAL POLYP SURGERY      2-3 months ago   POLYPECTOMY  01/30/2019   Procedure: POLYPECTOMY;  Surgeon: Golda Claudis PENNER, MD;  Location: [facility];  Service: Endoscopy;;  proximal transverse colon, cecal   Social History:  reports that he has never smoked. He has never used smokeless tobacco. He reports that  he does not drink alcohol and does not use drugs.  Allergies  Allergen Reactions   Ivp Dye [Iodinated Contrast Media] Other (See Comments)    Sped heart rate up    Penicillins Rash    Pt tolerated amoxicillin  oral challenge   Tetanus Toxoid-Containing Vaccines Rash    Family History  Problem Relation Age of Onset   Colon cancer Brother     Prior to Admission medications   Medication Sig Start Date End Date Taking? Authorizing Provider  ACCU-CHEK GUIDE test strip USE AS DIRECTED TO TEST TWICE DAILY 02/01/22   [provider]  acetaminophen  (TYLENOL ) 500 MG tablet Take 2 tablets (1,000 mg total) by mouth every 8 (eight) hours as needed for mild pain (pain score 1-3) or moderate pain (pain score 4-6). Pain 06/03/23   Suellen Cantor A, PA-C  atorvastatin  (LIPITOR) 10 MG tablet Take 10 mg by mouth at bedtime. 09/21/17   [provider]  B Complex-C (B-COMPLEX WITH VITAMIN C) tablet Take 1 tablet by mouth daily.    [provider]  buPROPion  (WELLBUTRIN  XL) 150 MG 24 hr tablet Take 150 mg by mouth at bedtime.  08/29/12   [provider]  Cholecalciferol  (VITAMIN D ) 50 MCG (2000 UT) CAPS Take 2,000 Units by mouth daily.     [provider]  feeding supplement (ENSURE ENLIVE / ENSURE PLUS) LIQD Take 237 mLs by mouth 2 (two) times daily between meals. 05/06/23   Sherrill Cable Latif, DO  ferrous sulfate  325 (65 FE) MG EC tablet Take 325 mg by mouth daily with breakfast.    [provider]  glipiZIDE (GLUCOTROL) 5 MG tablet Take 5 mg by mouth 2 (two) times daily. 05/05/22   [provider]  hydrALAZINE  (APRESOLINE ) 25 MG tablet TAKE 1 TABLET BY MOUTH IN THE MORNING AND AT BEDTIME. MAY TAKE ADDITIONAL AS NEEDED FOR SYSTOLIC BLOOD PRESSURE CONSISTENTLY ABOVE 140. 04/24/23   Branch, Dorn FALCON, MD  metoprolol  succinate (TOPROL -XL) 100 MG 24 hr tablet TAKE 1 TABLET BY MOUTH EVERY DAY TAKE WITH MEALS OR IMMEDIATELY AFTER A MEAL 05/08/23   Alvan Dorn FALCON, MD  Omega-3 Fatty Acids (FISH OIL) 1000 MG CAPS Take 1,000 mg by mouth 2 (two) times daily. Afternoon & bedtime.    [provider]  omeprazole  (PRILOSEC) 20 MG capsule TAKE ONE CAPSULE BY MOUTH DAILY. NEEDS OFFICE VISIT FOR FURTHER REFILLS. 01/24/22   Carlan, Chelsea L, NP  pioglitazone (ACTOS) 30 MG tablet Take 30 mg by mouth daily. 10/28/17   [provider]  senna-docusate (SENOKOT-S) 8.6-50 MG tablet Take 2 tablets by mouth at bedtime as needed for mild constipation. 05/05/23   Sherrill Cable Latif, DO  sertraline  (ZOLOFT ) 100 MG tablet Take 100 mg by mouth at bedtime.  05/09/15   [provider]  sodium bicarbonate  650 MG tablet Take 650 mg by mouth 2 (two) times daily. 07/18/17   [provider]  TOUJEO  MAX SOLOSTAR 300 UNIT/ML Solostar Pen  Inject 20 Units into the skin daily. 03/04/23   Ricky Fines, MD  ULORIC  80 MG TABS Take 80 mg by mouth daily.  04/21/15   [provider]    Physical Exam: Vitals:   01/16/24 1034 01/16/24 1045 01/16/24 1200 01/16/24 1230  BP:  (!) 176/64 (!) 168/65 (!) 162/58  Pulse:  (!) 116 (!) 102 (!) 102  Resp:  (!) 22 (!) 25 13  Temp:  99.3 F (37.4 C)    TempSrc:  Oral    SpO2:  99% 96% 96%  Weight: 103 kg     Height: 6' 5 (1.956 m)      General exam: Alert, awake, oriented x 3; reporting feeling nauseous and unwell.  No chest pain or shortness of breath. Respiratory system: Good air movement bilaterally; no using accessory muscles. Cardiovascular system: Sinus tachycardia, no rubs, no gallops, no JVD on exam. Gastrointestinal system: Abdomen is nondistended, soft and nontender.  Positive bowel sounds appreciated. Central nervous system: Moving 4 limbs spontaneously.  No focal neurological deficits. Extremities: No cyanosis or clubbing; left first toe amputation with complete healing process and no wounds or changes appreciated.  Right foot with metatarsal amputation and plantar ulcer present at the end  of examination; there is an associated redness and discomfort on palpation.  Patient reports intermittent drainage. Skin: No petechiae. Psychiatry: Judgement and insight appear normal. Mood & affect appropriate.   Data Reviewed: Comprehensive metabolic panel: Sodium 133, potassium 4.6, chloride 94, bicarb 22, glucose 309, BUN 18, creatinine 1.83, AST 12, ALT 11, alk phos 143 and GFR 39.  Patient's anion gap 17 CBC: WBC 16.0, hemoglobin 12.7 and platelet count 279K Lactic acid: 1.7 Respiratory panel: Negative for COVID, RSV and influenza.   Assessment and Plan: Assessment & Plan Sepsis (HCC) - In the setting of right foot cellulitis/most likely osteomyelitis component. - Continue IV broad-spectrum antibiotics as started and follow culture results - MRI has been ordered to rule out deeper infection; case has been discussed with Dr. Harden who expressed the need for patient to go to [facility] in the presence of deep infection/osteomyelitis process. - Continue as needed analgesics and supportive care - IV fluid resuscitation per sepsis protocol has been started. -Will check CRP and ESR. Hypertension - Stable and well-controlled - Resume home antihypertensive agents and follow. Type 2 diabetes with nephropathy (HCC) - With hyperglycemia time of admission; most likely associated with acute infection. - Update A1c - Sliding scale insulin  and Semglee  has been started - Holding oral hypoglycemic agents. Gout - Continue Uloric . GERD (gastroesophageal reflux disease) - Continue PPI. Chronic kidney disease, stage III (moderate) (HCC) - In the setting of diabetes/hypertension - Renal function appears to be stable - Continue fluid resuscitation's, minimize nephrotoxic agents, avoid the use of contrast/hypotension and continue the use of sodium bicarbonate . Depression with anxiety - No suicidal ideation or hallucination - Continue home antidepressant/anxiolytic therapy.   Advance Care  Planning:   Code Status: Full Code   Consults: Orthopedic service curbside.  Family Communication: No family at bedside.  Severity of Illness: The appropriate patient status for this patient is INPATIENT. Inpatient status is judged to be reasonable and necessary in order to provide the required intensity of service to ensure the patient's safety. The patient's presenting symptoms, physical exam findings, and initial radiographic and laboratory data in the context of their chronic comorbidities is felt to place them at high risk for further clinical deterioration. Furthermore, it is not anticipated that the patient will  be medically stable for discharge from the hospital within 2 midnights of admission.   * I certify that at the point of admission it is my clinical judgment that the patient will require inpatient hospital care spanning beyond 2 midnights from the point of admission due to high intensity of service, high risk for further deterioration and high frequency of surveillance required.*  Author: Eric Nunnery, MD 01/16/2024 1:23 PM  For on call review [email or web address].

## 2024-01-16 NOTE — ED Notes (Signed)
 Asked pt if he was able to void for urine sample to which he stated he has tried several times and has not been able to yet, he said he will let us  know when he can.

## 2024-01-16 NOTE — Assessment & Plan Note (Signed)
-   Stable and well-controlled - Resume home antihypertensive agents and follow.

## 2024-01-16 NOTE — Assessment & Plan Note (Signed)
-   With hyperglycemia time of admission; most likely associated with acute infection. - Update A1c - Sliding scale insulin  and Semglee  has been started - Holding oral hypoglycemic agents.

## 2024-01-17 DIAGNOSIS — K219 Gastro-esophageal reflux disease without esophagitis: Secondary | ICD-10-CM | POA: Diagnosis not present

## 2024-01-17 DIAGNOSIS — M869 Osteomyelitis, unspecified: Secondary | ICD-10-CM

## 2024-01-17 DIAGNOSIS — N1832 Chronic kidney disease, stage 3b: Secondary | ICD-10-CM | POA: Diagnosis not present

## 2024-01-17 DIAGNOSIS — A419 Sepsis, unspecified organism: Secondary | ICD-10-CM | POA: Diagnosis not present

## 2024-01-17 LAB — CBC
HCT: 28.8 % — ABNORMAL LOW (ref 39.0–52.0)
Hemoglobin: 9.5 g/dL — ABNORMAL LOW (ref 13.0–17.0)
MCH: 27 pg (ref 26.0–34.0)
MCHC: 33 g/dL (ref 30.0–36.0)
MCV: 81.8 fL (ref 80.0–100.0)
Platelets: 217 K/uL (ref 150–400)
RBC: 3.52 MIL/uL — ABNORMAL LOW (ref 4.22–5.81)
RDW: 14.6 % (ref 11.5–15.5)
WBC: 16.2 K/uL — ABNORMAL HIGH (ref 4.0–10.5)
nRBC: 0 % (ref 0.0–0.2)

## 2024-01-17 LAB — BASIC METABOLIC PANEL WITH GFR
Anion gap: 11 (ref 5–15)
BUN: 18 mg/dL (ref 8–23)
CO2: 23 mmol/L (ref 22–32)
Calcium: 8.1 mg/dL — ABNORMAL LOW (ref 8.9–10.3)
Chloride: 99 mmol/L (ref 98–111)
Creatinine, Ser: 1.93 mg/dL — ABNORMAL HIGH (ref 0.61–1.24)
GFR, Estimated: 36 mL/min — ABNORMAL LOW (ref >60)
Glucose, Bld: 199 mg/dL — ABNORMAL HIGH (ref 70–99)
Potassium: 3.8 mmol/L (ref 3.5–5.1)
Sodium: 133 mmol/L — ABNORMAL LOW (ref 135–145)

## 2024-01-17 LAB — GLUCOSE, CAPILLARY
Glucose-Capillary: 215 mg/dL — ABNORMAL HIGH (ref 70–99)
Glucose-Capillary: 253 mg/dL — ABNORMAL HIGH (ref 70–99)
Glucose-Capillary: 248 mg/dL — ABNORMAL HIGH (ref 70–99)
Glucose-Capillary: 164 mg/dL — ABNORMAL HIGH (ref 70–99)

## 2024-01-17 LAB — HEMOGLOBIN A1C
Hgb A1c MFr Bld: 9.8 % — ABNORMAL HIGH (ref 4.8–5.6)
Mean Plasma Glucose: 235 mg/dL

## 2024-01-17 MED ORDER — HYDRALAZINE HCL 20 MG/ML IJ SOLN
10.0000 mg | INTRAMUSCULAR | Status: DC | PRN
  Administered 2024-01-19 – 2024-01-23 (×3): 10 mg via INTRAVENOUS
  Filled 2024-01-17 (×3): qty 1

## 2024-01-17 MED ORDER — INSULIN ASPART 100 UNIT/ML IJ SOLN
5.0000 [IU] | Freq: Three times a day (TID) | INTRAMUSCULAR | Status: DC
  Administered 2024-01-17 – 2024-01-24 (×10): 5 [IU] via SUBCUTANEOUS
  Filled 2024-01-17 (×13): qty 5

## 2024-01-17 MED ORDER — HYDROMORPHONE HCL 1 MG/ML IJ SOLN: 0.5000 mg | INTRAMUSCULAR | Status: DC | PRN

## 2024-01-17 MED ORDER — PROCHLORPERAZINE EDISYLATE 10 MG/2ML IJ SOLN
10.0000 mg | Freq: Four times a day (QID) | INTRAMUSCULAR | Status: DC | PRN
  Administered 2024-01-17 (×2): 10 mg via INTRAVENOUS
  Filled 2024-01-17 (×2): qty 2

## 2024-01-17 NOTE — Progress Notes (Signed)
 PROGRESS NOTE   [name]  FMW:[id number] DOB: August 23, 1950 DOA: 01/16/2024 PCP: Marvin Rush, MD   Chief Complaint  Patient presents with   Emesis   Level of care: Telemetry  Brief Admission History:  73 y.o. 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  Patient has also reported pain, redness and intermittent drainage out of his right foot.  Patient reports no hematemesis, no focal weaknesses, no dysuria/hematuria, no melena, no hematochezia, no chest pain or abdominal pain, no shortness of breath, no frank fever, sick contacts or any other complaints.   Workup in the ED demonstrating concern for early sepsis most likely associated with right foot osteomyelitis.  Patient with low-grade temperature, tachycardic, tachypneic and with elevated WBCs.  Cultures take, IV antibiotics has been started and TRH contacted to place patient in the hospital for further evaluation and management.   Assessment and Plan:  Chronic osteomyelitis of the distal first metatarsal with associated plantar-medial ulceration distally along the amputation -- discussion with Dr Harden and Ozell Ned who have agreed to consult on patient at [facility]  Pt will be transferred to [facility] for the ortho consult and likely operative management.  -- continue IV antibiotics as ordered for now -- care order placed to notify orthopedics when patient arrives at [facility]   Sepsis from right foot osteomyelitis -- sepsis physiology is resolved now  -- continue supportive management -- surgical consult pending at [facility] for source control, Dr.  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  -- A1c is pending at this time -- adding novolog  5 units TID with meals eaten>50% -- continue frequent CBG monitoring 5x per day  -- continue glargine 15 units daily  CBG (last 3)  Recent Labs    01/16/24 2103 01/17/24 0734 01/17/24 1047  GLUCAP 220* 215* 253*   Essential (Primary) Hypertension -- stable, controlled -- has has been restarted on his home meds  GERD -- stable on PPI therapy  GOUT -- resume home antihyperuricemic medication  Stage 3b CKD  -- stable, follow daily  Depression/Anxiety -- currently stable  -- resumed home medication  DVT prophylaxis: sq heparin  Code Status: Full  Family Communication:  Disposition: TBD    Consultants:  Dr Harden (orthopedics)   Procedures:  (Pending ortho consult)  Antimicrobials:  Ceftriaxone  12/2>> Vancomycin  12/2>>   Subjective: No specific complaints, agreeable to transfer to [facility], pain controlled, tolerating diet    Objective: Vitals:   01/16/24 1755 01/16/24 2044 01/17/24 0217 01/17/24 0607  BP: (!) 164/72 (!) 143/68 (!) 132/57 (!) 128/58  Pulse: 87 95 99 96  Resp: 18 17 20    Temp: 99.7 F (37.6 C) (!) 101.2 F (38.4 C) 98.5 F (36.9 C) 98.3 F (36.8 C)  TempSrc: Oral Oral Oral Oral  SpO2: 98% 98% 93% 95%  Weight:      Height:        Intake/Output Summary (Last 24 hours) at 01/17/2024 1032 Last data filed at 01/16/2024 1900 Gross per 24 hour  Intake 988.74 ml  Output --  Net 988.74 ml   Filed Weights   01/16/24 1034  Weight: 103 kg   Examination:  General exam: Appears calm and comfortable  Respiratory system: Clear to auscultation. Respiratory effort normal. Cardiovascular system: normal S1 & S2 heard. No JVD, murmurs, rubs, gallops or clicks. No pedal edema. Gastrointestinal system: Abdomen is nondistended, soft and nontender. No organomegaly or masses felt. Normal bowel sounds heard. Central nervous system:  Alert and oriented. No focal neurological deficits. Extremities: right foot stump with ulceration and mild surrounding erythema/infection Skin: No rashes, lesions or ulcers. Psychiatry: Judgement and insight appear normal. Mood & affect appropriate.   Data Reviewed: I have personally reviewed following labs and imaging studies  CBC: Recent Labs  Lab 01/16/24 1037 01/17/24 0415  WBC 16.0* 16.2*  HGB 12.7* 9.5*  HCT 38.4* 28.8*  MCV 81.4 81.8  PLT 279 217    Basic Metabolic Panel: Recent Labs  Lab 01/16/24 1037 01/16/24 1417 01/17/24 0415  NA 133*  --  133*  K 4.6  --  3.8  CL 94*  --  99  CO2 22  --  23  GLUCOSE 309*  --  199*  BUN 18  --  18  CREATININE 1.83*  --  1.93*  CALCIUM  10.0  --  8.1*  MG  --  2.0  --   PHOS  --  2.3*  --     CBG: Recent Labs  Lab 01/16/24 1811 01/16/24 2103 01/17/24 0734  GLUCAP 211* 220* 215*    Recent Results (from the past 240 hours)  Culture, blood (routine x 2)     Status: None (Preliminary result)   Collection Time: 01/16/24 11:00 AM   Specimen: BLOOD  Result Value Ref Range Status   Specimen Description BLOOD BLOOD RIGHT ARM  Final   Special Requests   Final    BOTTLES DRAWN AEROBIC AND ANAEROBIC Blood Culture adequate volume   Culture   Final    NO GROWTH < 24 HOURS Performed at [facility], [address] [place], [place] [address]    Report Status PENDING  Incomplete  Culture, blood (routine x 2)     Status: None (Preliminary result)   Collection Time: 01/16/24 11:00 AM   Specimen: BLOOD  Result Value Ref Range Status   Specimen Description BLOOD BLOOD LEFT ARM  Final   Special Requests   Final    BOTTLES DRAWN AEROBIC AND ANAEROBIC Blood Culture adequate volume   Culture   Final    NO GROWTH < 24 HOURS Performed at [facility], [address] [place], [place] [address]    Report Status PENDING  Incomplete  Resp panel by RT-PCR (RSV, Flu A&B, Covid) Anterior Nasal Swab     Status: None   Collection Time:  01/16/24 11:03 AM   Specimen: Anterior Nasal Swab  Result Value Ref Range Status   SARS Coronavirus 2 by RT PCR NEGATIVE NEGATIVE Final    Comment: (NOTE) SARS-CoV-2 target nucleic acids are NOT DETECTED.  The SARS-CoV-2 RNA is generally detectable in upper respiratory specimens during the acute phase of infection. The lowest concentration of SARS-CoV-2 viral copies this assay can detect is 138 copies/mL. A negative result does not preclude SARS-Cov-2 infection and should not be used as the sole basis for treatment or other patient management decisions. A negative result may occur with  improper specimen collection/handling, submission of specimen other than nasopharyngeal swab, presence of viral mutation(s) within the areas targeted by this assay, and inadequate number of viral copies(<138 copies/mL). A negative result must be combined with clinical observations, patient history, and epidemiological information. The  expected result is Negative.  Fact Sheet for Patients:  bloggercourse.com  Fact Sheet for Healthcare Providers:  seriousbroker.it  This test is no t yet approved or cleared by the [place]  FDA and  has been authorized for detection and/or diagnosis of SARS-CoV-2 by FDA under an Emergency Use Authorization (EUA). This EUA will remain  in effect (meaning this test can be used) for the duration of the COVID-19 declaration under Section 564(b)(1) of the Act, 21 U.S.C.section 360bbb-3(b)(1), unless the authorization is terminated  or revoked sooner.       Influenza A by PCR NEGATIVE NEGATIVE Final   Influenza B by PCR NEGATIVE NEGATIVE Final    Comment: (NOTE) The Xpert Xpress SARS-CoV-2/FLU/RSV plus assay is intended as an aid in the diagnosis of influenza from Nasopharyngeal swab specimens and should not be used as a sole basis for treatment. Nasal washings and aspirates are unacceptable for Xpert Xpress  SARS-CoV-2/FLU/RSV testing.  Fact Sheet for Patients: bloggercourse.com  Fact Sheet for Healthcare Providers: seriousbroker.it  This test is not yet approved or cleared by the [place]  FDA and has been authorized for detection and/or diagnosis of SARS-CoV-2 by FDA under an Emergency Use Authorization (EUA). This EUA will remain in effect (meaning this test can be used) for the duration of the COVID-19 declaration under Section 564(b)(1) of the Act, 21 U.S.C. section 360bbb-3(b)(1), unless the authorization is terminated or revoked.     Resp Syncytial Virus by PCR NEGATIVE NEGATIVE Final    Comment: (NOTE) Fact Sheet for Patients: bloggercourse.com  Fact Sheet for Healthcare Providers: seriousbroker.it  This test is not yet approved or cleared by the [place]  FDA and has been authorized for detection and/or diagnosis of SARS-CoV-2 by FDA under an Emergency Use Authorization (EUA). This EUA will remain in effect (meaning this test can be used) for the duration of the COVID-19 declaration under Section 564(b)(1) of the Act, 21 U.S.C. section 360bbb-3(b)(1), unless the authorization is terminated or revoked.  Performed at [facility], [address] [place], [place] [address]      Radiology Studies: MR FOOT RIGHT WO CONTRAST Result Date: 01/16/2024 EXAM: MRI of the right Foot without contrast. 01/16/2024 02:09:27 PM TECHNIQUE: Multiplanar multisequence MRI of the right foot was performed without the administration of intravenous contrast. COMPARISON: Foot radiographs 01/16/2024. CLINICAL HISTORY: Right foot pain, possible osteomyelitis, history of amputations. FINDINGS: LISFRANC JOINT: Visualized Lisfranc ligament is intact. No significant Lisfranc interval widening or significant periligamentous edema. BONE MARROW: Prior transmetatarsal amputation. Scalloped/eroded distal  margin of the 1st metatarsal with mild but increased marrow edema signal in the underlying shaft and proximal metaphysis of the 1st metatarsal compared to 05/03/2023, suggesting chronic osteomyelitis. Low level marrow edema in the mediocuneiform and in the navicular, probably related to arthropathy but technically nonspecific. Plantar calcaneal spur. SOFT TISSUES: Cutaneous and subcutaneous ulceration along the plantar-medial side of the amputation. Highly irregular and indistinct plantar fascia, as well as new edema and infiltration in the previously fatty flexor digitorum brevis and abductor digiti minimi muscles raising the possibility of plantar fascia rupture with surrounding edema. The remaining musculature of the foot is atrophic. TENDONS: Distal Achilles tendinopathy with thickening of the Achilles tendon and spurring from the calcaneus extending in the superficial distal margin of the Achilles tendon. Tibialis posterior and flexor digitorum longus tenosynovitis. OTHER ARTICULATIONS: Small dorsal effusion of the talonavicular articulation. Small lateral effusion of the calcaneocuboid joint and the articulation between the cuboid and 5th metatarsal base. IMPRESSION: 1. Chronic osteomyelitis  of the distal first metatarsal with associated plantar-medial ulceration distally along the amputation. 2. Possible plantar fascia rupture with surrounding fluid/edema involving the otherwise atrophic flexor digitorum brevis and abductor digiti minimi muscles. 3. Small effusions in the talonavicular, calcaneocuboid, and cuboidfifth metatarsal base articulations. 4. Distal Achilles tendinopathy with spurring and superficial extension. 5. Tibialis posterior and flexor digitorum longus tenosynovitis. Electronically signed by: Ryan Salvage MD 01/16/2024 02:34 PM EST RP Workstation: [id number]   DG Foot Complete Right Result Date: 01/16/2024 CLINICAL DATA:  Right foot plantar ulcer with associated erythema. EXAM: RIGHT  FOOT COMPLETE - 3+ VIEW COMPARISON:  03/01/2023 and right foot MRI dated 05/03/2023 FINDINGS: Diffuse soft tissue swelling with an interval decrease in size of the air containing ulcer in the plantar aspect of the distal foot. No separate soft tissue gas and no interval bone destruction and periosteal reaction. Again demonstrated are post amputation changes of all 5 digits at the level of the distal metatarsals. The underlying bone margins are sharp and defined on the PA and oblique images. On the lateral image, one of the bone margins is indistinct ventrally with improvement. IMPRESSION: 1. Interval decrease in size of the air containing ulcer in the plantar aspect of the distal foot. 2. Continued indistinct margins of one of the distal metatarsals ventrally at the amputation site. Although this has improved since 03/01/2023, osteomyelitis can not be excluded at this location. This could be best evaluated with pre and postcontrast MRI of the foot. Electronically Signed   By: Elspeth Bathe M.D.   On: 01/16/2024 11:16   DG Chest Port 1 View Result Date: 01/16/2024 CLINICAL DATA:  Generalized weakness and vomiting. EXAM: PORTABLE CHEST 1 VIEW COMPARISON:  05/09/2023 FINDINGS: Normal-sized heart. Clear lungs with normal vascularity. Lower thoracic spine degenerative changes. IMPRESSION: No acute abnormality. Electronically Signed   By: Elspeth Bathe M.D.   On: 01/16/2024 11:09    Scheduled Meds:  atorvastatin   10 mg Oral QHS   buPROPion   150 mg Oral QHS   febuxostat   80 mg Oral Daily   feeding supplement  237 mL Oral BID BM   heparin   5,000 Units Subcutaneous Q8H   insulin  aspart  0-15 Units Subcutaneous TID WC   insulin  aspart  0-5 Units Subcutaneous QHS   insulin  aspart  5 Units Subcutaneous TID WC   insulin  glargine-yfgn  15 Units Subcutaneous QHS   metoprolol  succinate  100 mg Oral Daily   pantoprazole   40 mg Oral Daily   sertraline   100 mg Oral QHS   sodium bicarbonate   650 mg Oral BID    Continuous Infusions:  cefTRIAXone  (ROCEPHIN )  IV 2 g (01/17/24 0827)   vancomycin       LOS: 1 day   Time spent: 55 mins  Breshay Ilg Vicci, MD How to contact the [facility] Attending or Consulting provider 7A - 7P or covering provider during after hours 7P -7A, for this patient?  Check the care team in [facility] and look for a) attending/consulting [facility] provider listed and b) the [facility] team listed Log into [email or web address] to find provider on call.  Locate the [facility] provider you are looking for under [facility] and page to a number that you can be directly reached. If you still have difficulty reaching the provider, please page the [facility] (Director on Call) for the Hospitalists listed on amion for assistance.  01/17/2024, 10:32 AM

## 2024-01-17 NOTE — Inpatient Diabetes Management (Signed)
 Inpatient Diabetes Program Recommendations  AACE/ADA: New Consensus Statement on Inpatient Glycemic Control   Target Ranges:  Prepandial:   less than 140 mg/dL      Peak postprandial:   less than 180 mg/dL (1-2 hours)      Critically ill patients:  140 - 180 mg/dL    Latest Reference Range & Units 01/16/24 18:11 01/16/24 21:03 01/17/24 07:34  Glucose-Capillary 70 - 99 mg/dL 788 (H) 779 (H) 784 (H)   Review of Glycemic Control  Diabetes history: DM2 Outpatient Diabetes medications: Amaryl 4 mg BID, Toujeo  20 units daily (not taking) Current orders for Inpatient glycemic control: Semglee  15 units at bedtime, Novolog  0-15 units TID with meals, Novolog  0-5 units QHS  Inpatient Diabetes Program Recommendations:    Insulin : May want to consider increasing Semglee  to 17  units at bedtime.  Thanks, Earnie Gainer, RN, MSN, CDCES Diabetes Coordinator Inpatient Diabetes Program [phone] (Team Pager from 8am to 5pm)

## 2024-01-17 NOTE — Plan of Care (Signed)

## 2024-01-17 NOTE — Hospital Course (Signed)
 [name] is a 73 y.o. male with a history of hyperlipidemia, CKD stage IIIa, hypertension, SVT, gout, diabetes mellitus type 2, osteomyelitis, GERD.  Patient presented secondary to emesis and found to have concern for sepsis on presentation secondary to right foot osteomyelitis. Empiric antibiotics started and orthopedic surgery consulted and plan surgical management.

## 2024-01-17 NOTE — Progress Notes (Signed)
 Spoke with Insurance Underwriter and provided report.

## 2024-01-17 NOTE — TOC Initial Note (Signed)
 Transition of Care [facility]) - Initial/Assessment Note    Patient Details  Name: [name] MRN: [id number] Date of Birth: 11/02/1950  [facility] CM/SW Contact:    Lucie Lunger, LCSWA Phone Number: 01/17/2024, 10:24 AM  Clinical Narrative:                 Pt is high risk for readmission. Pt is known to [facility] from past hospital admission. Pt lives alone and is independent in completing his ADLs. Pt has transportation provided when needed. Pt has a walker to use when needed. TOC to follow.   Expected Discharge Plan: Home/Self Care Barriers to Discharge: Continued Medical Work up   Patient Goals and CMS Choice Patient states their goals for this hospitalization and ongoing recovery are:: return home CMS Medicare.gov Compare Post Acute Care list provided to:: Patient Choice offered to / list presented to : Patient      Expected Discharge Plan and Services In-house Referral: Clinical Social Work Discharge Planning Services: CM Consult   Living arrangements for the past 2 months: Single Family Home                                      Prior Living Arrangements/Services Living arrangements for the past 2 months: Single Family Home Lives with:: Self Patient language and need for interpreter reviewed:: Yes Do you feel safe going back to the place where you live?: Yes      Need for Family Participation in Patient Care: Yes (Comment) Care giver support system in place?: Yes (comment) Current home services: DME Criminal Activity/Legal Involvement Pertinent to Current Situation/Hospitalization: No - Comment as needed  Activities of Daily Living   ADL Screening (condition at time of admission) Independently performs ADLs?: Yes (appropriate for developmental age) Is the patient deaf or have difficulty hearing?: No Does the patient have difficulty seeing, even when wearing glasses/contacts?: No Does the patient have difficulty concentrating, remembering, or  making decisions?: No  Permission Sought/Granted                  Emotional Assessment Appearance:: Appears stated age Attitude/Demeanor/Rapport: Engaged Affect (typically observed): Accepting   Alcohol / Substance Use: Not Applicable Psych Involvement: No (comment)  Admission diagnosis:  Sepsis (HCC) [A41.9] Sepsis, due to unspecified organism, unspecified whether acute organ dysfunction present [facility]) [A41.9] Patient Active Problem List   Diagnosis Date Noted   Depression with anxiety 01/16/2024   Acute ischemic stroke (HCC) 05/03/2023   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/03/2023   Chronic osteomyelitis (HCC) 05/03/2023   Hyperglycemia 03/04/2023   Acute osteomyelitis of ankle or foot, right (HCC) 03/01/2023   Lactic acidosis 03/01/2023   History of transmetatarsal amputation of right foot (HCC) 04/06/2022   Cutaneous abscess of right foot 02/04/2022   Osteomyelitis of great toe of left foot (HCC)    Hyperlipidemia 11/30/2021   Bedbug bite with infection 11/30/2021    Class: Acute   AKI (acute kidney injury) 11/12/2021   Sepsis (HCC) 11/11/2021   Amputated great toe of right foot 02/20/2019   Stage 3a chronic kidney disease (HCC) 02/13/2019   Acute osteomyelitis (HCC)    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/30/2019   Fatty liver 01/09/2019   Thrombocytopenia, unspecified 05/14/2013   Other specified abnormal findings of blood chemistry 05/14/2013   Colon adenoma 09/13/2012   Anxiety 09/06/2012   Intellectual disability 09/06/2012   Chest  pain 09/06/2012   GERD (gastroesophageal reflux disease) 03/08/2012   Gout 09/06/2011   Hypertension 03/08/2011   Chronic kidney disease, stage III (moderate) (HCC) 11/30/2009   Urinary tract infection 11/30/2009   PCP:  Marvine Rush, MD Pharmacy:   [facility] [phone] - [place], [place] - [address] AT [facility] [address] [place] [address] Phone: [phone] Fax:  [phone]     Social Drivers of Health (SDOH) Social History: SDOH Screenings   Food Insecurity: No Food Insecurity (01/16/2024)  Housing: Low Risk  (01/16/2024)  Transportation Needs: No Transportation Needs (01/16/2024)  Utilities: Not At Risk (01/16/2024)  Financial Resource Strain: Low Risk  (12/13/2021)  Social Connections: Socially Isolated (01/16/2024)  Tobacco Use: Low Risk  (01/16/2024)   SDOH Interventions:     Readmission Risk Interventions    11/30/2021    2:51 PM  Readmission Risk Prevention Plan  Transportation Screening Complete  HRI or Home Care Consult Complete  Social Work Consult for Recovery Care Planning/Counseling Complete  Palliative Care Screening Not Applicable  Medication Review Oceanographer) Complete

## 2024-01-18 DIAGNOSIS — M86171 Other acute osteomyelitis, right ankle and foot: Secondary | ICD-10-CM | POA: Diagnosis not present

## 2024-01-18 DIAGNOSIS — A419 Sepsis, unspecified organism: Secondary | ICD-10-CM | POA: Diagnosis not present

## 2024-01-18 DIAGNOSIS — N1832 Chronic kidney disease, stage 3b: Secondary | ICD-10-CM | POA: Diagnosis not present

## 2024-01-18 LAB — CBC WITH DIFFERENTIAL/PLATELET
Abs Immature Granulocytes: 0.14 K/uL — ABNORMAL HIGH (ref 0.00–0.07)
Basophils Absolute: 0 K/uL (ref 0.0–0.1)
Basophils Relative: 0 %
Eosinophils Absolute: 0.1 K/uL (ref 0.0–0.5)
Eosinophils Relative: 0 %
HCT: 29.8 % — ABNORMAL LOW (ref 39.0–52.0)
Hemoglobin: 9.8 g/dL — ABNORMAL LOW (ref 13.0–17.0)
Immature Granulocytes: 1 %
Lymphocytes Relative: 3 %
Lymphs Abs: 0.5 K/uL — ABNORMAL LOW (ref 0.7–4.0)
MCH: 26.7 pg (ref 26.0–34.0)
MCHC: 32.9 g/dL (ref 30.0–36.0)
MCV: 81.2 fL (ref 80.0–100.0)
Monocytes Absolute: 1 K/uL (ref 0.1–1.0)
Monocytes Relative: 6 %
Neutro Abs: 15.2 K/uL — ABNORMAL HIGH (ref 1.7–7.7)
Neutrophils Relative %: 90 %
Platelets: 226 K/uL (ref 150–400)
RBC: 3.67 MIL/uL — ABNORMAL LOW (ref 4.22–5.81)
RDW: 14.6 % (ref 11.5–15.5)
WBC: 16.9 K/uL — ABNORMAL HIGH (ref 4.0–10.5)
nRBC: 0 % (ref 0.0–0.2)

## 2024-01-18 LAB — BASIC METABOLIC PANEL WITH GFR
Anion gap: 9 (ref 5–15)
BUN: 23 mg/dL (ref 8–23)
CO2: 23 mmol/L (ref 22–32)
Calcium: 8 mg/dL — ABNORMAL LOW (ref 8.9–10.3)
Chloride: 103 mmol/L (ref 98–111)
Creatinine, Ser: 2.42 mg/dL — ABNORMAL HIGH (ref 0.61–1.24)
GFR, Estimated: 28 mL/min — ABNORMAL LOW (ref >60)
Glucose, Bld: 147 mg/dL — ABNORMAL HIGH (ref 70–99)
Potassium: 3.2 mmol/L — ABNORMAL LOW (ref 3.5–5.1)
Sodium: 135 mmol/L (ref 135–145)

## 2024-01-18 LAB — GLUCOSE, CAPILLARY
Glucose-Capillary: 170 mg/dL — ABNORMAL HIGH (ref 70–99)
Glucose-Capillary: 173 mg/dL — ABNORMAL HIGH (ref 70–99)
Glucose-Capillary: 175 mg/dL — ABNORMAL HIGH (ref 70–99)
Glucose-Capillary: 140 mg/dL — ABNORMAL HIGH (ref 70–99)
Glucose-Capillary: 124 mg/dL — ABNORMAL HIGH (ref 70–99)
Glucose-Capillary: 148 mg/dL — ABNORMAL HIGH (ref 70–99)

## 2024-01-18 LAB — C DIFFICILE QUICK SCREEN W PCR REFLEX
C Diff antigen: NEGATIVE
C Diff interpretation: NOT DETECTED
C Diff toxin: NEGATIVE

## 2024-01-18 MED ORDER — SODIUM CHLORIDE 0.9 % IV SOLN: INTRAVENOUS | Status: AC

## 2024-01-18 MED ORDER — ACETAMINOPHEN 325 MG PO TABS
325.0000 mg | ORAL_TABLET | Freq: Once | ORAL | Status: AC
  Administered 2024-01-18: 325 mg via ORAL
  Filled 2024-01-18: qty 1

## 2024-01-18 MED ORDER — SODIUM CHLORIDE 0.9 % IV BOLUS
500.0000 mL | Freq: Once | INTRAVENOUS | Status: AC
  Administered 2024-01-18: 500 mL via INTRAVENOUS

## 2024-01-18 MED ORDER — VANCOMYCIN HCL 1250 MG/250ML IV SOLN
1250.0000 mg | INTRAVENOUS | Status: DC
  Administered 2024-01-19: 1250 mg via INTRAVENOUS
  Filled 2024-01-18 (×2): qty 250

## 2024-01-18 MED ORDER — ACETAMINOPHEN 325 MG PO TABS
975.0000 mg | ORAL_TABLET | Freq: Four times a day (QID) | ORAL | Status: AC
  Administered 2024-01-19: 975 mg via ORAL
  Filled 2024-01-18: qty 3

## 2024-01-18 MED ORDER — POTASSIUM CHLORIDE CRYS ER 20 MEQ PO TBCR
40.0000 meq | EXTENDED_RELEASE_TABLET | Freq: Once | ORAL | Status: AC
  Administered 2024-01-18: 40 meq via ORAL
  Filled 2024-01-18: qty 2

## 2024-01-18 MED ORDER — ACETAMINOPHEN 650 MG RE SUPP: 650.0000 mg | Freq: Four times a day (QID) | RECTAL | Status: AC

## 2024-01-18 NOTE — Progress Notes (Signed)
 PROGRESS NOTE    [name]  FMW:[id number] DOB: 05/01/1950 DOA: 01/16/2024 PCP: Marvin Rush, MD   Brief Narrative: [name] is a 73 y.o. male with a history of hyperlipidemia, CKD stage IIIa, hypertension, SVT, gout, diabetes mellitus type 2, osteomyelitis, GERD.  Patient presented secondary to emesis and found to have concern for sepsis on presentation secondary to right foot osteomyelitis. Empiric antibiotics started and orthopedic surgery consulted and plan surgical management.   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 Orthopedic surgery consulted and plan a first ray amputation on 12/3 -Continue Vancomycin  and Ceftriaxone  -Follow-up orthopedic surgery recommendations -PT/OT consult post-surgery  Sepsis Present on admission. Secondary to osteomyelitis. Patient with continued fevers. Blood cultures obtained on admission are no growth to date.  Diarrhea Patient with a one week history of diarrhea. No associated abdominal pain. No nausea or vomiting. -Check a GI pathogen panel  Diabetes mellitus type 2 Poorly controlled with hyperglycemia based on hemoglobin A1C of 9.8%. -Continue insulin  glargine and SSI  Primary hypertension -Continue Toprol  XL  GERD -Continue Protonix   Gout -Continue feboxostat  AKI on CKD stage IIIb Baseline creatinine appears to be around 1.8. Creatinine worsened to 2.42 today. Unclear but likely related to fluid loss from diarrhea. -IV fluids -Check urine sodium and urine creatinine for FENa  Hypokalemia -potassium supplementation  Depression Anxiety -Continue Wellbutrin  XL and sertraline    DVT prophylaxis: Subcutaneous heparin  Code Status:   Code Status: Full Code Family Communication: None at bedside Disposition Plan: Discharge pending ongoing specialist recommendations   Consultants:  Orthopedic surgerg  Procedures:   None  Antimicrobials: Vancomycin  Ceftriaxone     Subjective: Patient reports multiple watery stools overnight. He reports having had this issue for the past week.  Objective: BP (!) 140/63 (BP Location: Left Arm)   Pulse 86   Temp 98.6 F (37 C)   Resp 16   Ht 6' 5 (1.956 m)   Wt 103 kg   SpO2 92%   BMI 26.93 kg/m   Examination:  General exam: Appears calm and comfortable. Respiratory system: Clear to auscultation. Respiratory effort normal. Cardiovascular system: S1 & S2 heard, RRR. No murmur. Gastrointestinal system: Abdomen is distended, soft and nontender. Normal bowel sounds heard. Central nervous system: Alert and oriented. No focal neurological deficits. Psychiatry: Judgement and insight appear normal. Mood & affect appropriate.    Data Reviewed: I have personally reviewed following labs and imaging studies  CBC Lab Results  Component Value Date   WBC 16.2 (H) 01/17/2024   RBC 3.52 (L) 01/17/2024   HGB 9.5 (L) 01/17/2024   HCT 28.8 (L) 01/17/2024   MCV 81.8 01/17/2024   MCH 27.0 01/17/2024   PLT 217 01/17/2024   MCHC 33.0 01/17/2024   RDW 14.6 01/17/2024   LYMPHSABS 0.8 06/03/2023   MONOABS 0.7 06/03/2023   EOSABS 0.1 06/03/2023   BASOSABS 0.0 06/03/2023     Last metabolic panel Lab Results  Component Value Date   NA 133 (L) 01/17/2024   K 3.8 01/17/2024   CL 99 01/17/2024   CO2 23 01/17/2024   BUN 18 01/17/2024   CREATININE 1.93 (H) 01/17/2024   GLUCOSE 199 (H) 01/17/2024   GFRNONAA 36 (L) 01/17/2024   GFRAA >60 02/13/2019   CALCIUM  8.1 (L) 01/17/2024   PHOS 2.3 (L) 01/16/2024   PROT 8.4 (H) 01/16/2024   ALBUMIN 4.1 01/16/2024   BILITOT 1.1 01/16/2024   ALKPHOS 143 (H) 01/16/2024  AST 12 (L) 01/16/2024   ALT 11 01/16/2024   ANIONGAP 11 01/17/2024    GFR: Estimated Creatinine Clearance: 43.6 mL/min (A) (by C-G formula based on SCr of 1.93 mg/dL (H)).  Recent Results (from the past 240 hours)  Culture, blood (routine x 2)      Status: None (Preliminary result)   Collection Time: 01/16/24 11:00 AM   Specimen: BLOOD  Result Value Ref Range Status   Specimen Description BLOOD BLOOD RIGHT ARM  Final   Special Requests   Final    BOTTLES DRAWN AEROBIC AND ANAEROBIC Blood Culture adequate volume   Culture   Final    NO GROWTH < 24 HOURS Performed at [facility], [address] [place], [place] [address]    Report Status PENDING  Incomplete  Culture, blood (routine x 2)     Status: None (Preliminary result)   Collection Time: 01/16/24 11:00 AM   Specimen: BLOOD  Result Value Ref Range Status   Specimen Description BLOOD BLOOD LEFT ARM  Final   Special Requests   Final    BOTTLES DRAWN AEROBIC AND ANAEROBIC Blood Culture adequate volume   Culture   Final    NO GROWTH < 24 HOURS Performed at [facility], [address], [place], [place] [address]    Report Status PENDING  Incomplete  Resp panel by RT-PCR (RSV, Flu A&B, Covid) Anterior Nasal Swab     Status: None   Collection Time: 01/16/24 11:03 AM   Specimen: Anterior Nasal Swab  Result Value Ref Range Status   SARS Coronavirus 2 by RT PCR NEGATIVE NEGATIVE Final    Comment: (NOTE) SARS-CoV-2 target nucleic acids are NOT DETECTED.  The SARS-CoV-2 RNA is generally detectable in upper respiratory specimens during the acute phase of infection. The lowest concentration of SARS-CoV-2 viral copies this assay can detect is 138 copies/mL. A negative result does not preclude SARS-Cov-2 infection and should not be used as the sole basis for treatment or other patient management decisions. A negative result may occur with  improper specimen collection/handling, submission of specimen other than nasopharyngeal swab, presence of viral mutation(s) within the areas targeted by this assay, and inadequate number of viral copies(<138 copies/mL). A negative result must be combined with clinical observations, patient history, and epidemiological information. The expected  result is Negative.  Fact Sheet for Patients:  bloggercourse.com  Fact Sheet for Healthcare Providers:  seriousbroker.it  This test is no t yet approved or cleared by the [place]  FDA and  has been authorized for detection and/or diagnosis of SARS-CoV-2 by FDA under an Emergency Use Authorization (EUA). This EUA will remain  in effect (meaning this test can be used) for the duration of the COVID-19 declaration under Section 564(b)(1) of the Act, 21 U.S.C.section 360bbb-3(b)(1), unless the authorization is terminated  or revoked sooner.       Influenza A by PCR NEGATIVE NEGATIVE Final   Influenza B by PCR NEGATIVE NEGATIVE Final    Comment: (NOTE) The Xpert Xpress SARS-CoV-2/FLU/RSV plus assay is intended as an aid in the diagnosis of influenza from Nasopharyngeal swab specimens and should not be used as a sole basis for treatment. Nasal washings and aspirates are unacceptable for Xpert Xpress SARS-CoV-2/FLU/RSV testing.  Fact Sheet for Patients: bloggercourse.com  Fact Sheet for Healthcare Providers: seriousbroker.it  This test is not yet approved or cleared by the [place]  FDA and has been authorized for detection and/or diagnosis of SARS-CoV-2 by FDA under an Emergency Use Authorization (EUA).  This EUA will remain in effect (meaning this test can be used) for the duration of the COVID-19 declaration under Section 564(b)(1) of the Act, 21 U.S.C. section 360bbb-3(b)(1), unless the authorization is terminated or revoked.     Resp Syncytial Virus by PCR NEGATIVE NEGATIVE Final    Comment: (NOTE) Fact Sheet for Patients: bloggercourse.com  Fact Sheet for Healthcare Providers: seriousbroker.it  This test is not yet approved or cleared by the [place]  FDA and has been authorized for detection and/or diagnosis of  SARS-CoV-2 by FDA under an Emergency Use Authorization (EUA). This EUA will remain in effect (meaning this test can be used) for the duration of the COVID-19 declaration under Section 564(b)(1) of the Act, 21 U.S.C. section 360bbb-3(b)(1), unless the authorization is terminated or revoked.  Performed at [facility], [address], [place], [place] [address]       Radiology Studies: MR FOOT RIGHT WO CONTRAST Result Date: 01/16/2024 EXAM: MRI of the right Foot without contrast. 01/16/2024 02:09:27 PM TECHNIQUE: Multiplanar multisequence MRI of the right foot was performed without the administration of intravenous contrast. COMPARISON: Foot radiographs 01/16/2024. CLINICAL HISTORY: Right foot pain, possible osteomyelitis, history of amputations. FINDINGS: LISFRANC JOINT: Visualized Lisfranc ligament is intact. No significant Lisfranc interval widening or significant periligamentous edema. BONE MARROW: Prior transmetatarsal amputation. Scalloped/eroded distal margin of the 1st metatarsal with mild but increased marrow edema signal in the underlying shaft and proximal metaphysis of the 1st metatarsal compared to 05/03/2023, suggesting chronic osteomyelitis. Low level marrow edema in the mediocuneiform and in the navicular, probably related to arthropathy but technically nonspecific. Plantar calcaneal spur. SOFT TISSUES: Cutaneous and subcutaneous ulceration along the plantar-medial side of the amputation. Highly irregular and indistinct plantar fascia, as well as new edema and infiltration in the previously fatty flexor digitorum brevis and abductor digiti minimi muscles raising the possibility of plantar fascia rupture with surrounding edema. The remaining musculature of the foot is atrophic. TENDONS: Distal Achilles tendinopathy with thickening of the Achilles tendon and spurring from the calcaneus extending in the superficial distal margin of the Achilles tendon. Tibialis posterior and flexor digitorum  longus tenosynovitis. OTHER ARTICULATIONS: Small dorsal effusion of the talonavicular articulation. Small lateral effusion of the calcaneocuboid joint and the articulation between the cuboid and 5th metatarsal base. IMPRESSION: 1. 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 2. Possible plantar fascia rupture with surrounding fluid/edema involving the otherwise atrophic flexor digitorum brevis and abductor digiti minimi muscles. 3. Small effusions in the talonavicular, calcaneocuboid, and cuboidfifth metatarsal base articulations. 4. Distal Achilles tendinopathy with spurring and superficial extension. 5. Tibialis posterior and flexor digitorum longus tenosynovitis. Electronically signed by: Ryan Salvage MD 01/16/2024 02:34 PM EST RP Workstation: [id number]   DG Foot Complete Right Result Date: 01/16/2024 CLINICAL DATA:  Right foot plantar ulcer with associated erythema. EXAM: RIGHT FOOT COMPLETE - 3+ VIEW COMPARISON:  03/01/2023 and right foot MRI dated 05/03/2023 FINDINGS: Diffuse soft tissue swelling with an interval decrease in size of the air containing ulcer in the plantar aspect of the distal foot. No separate soft tissue gas and no interval bone destruction and periosteal reaction. Again demonstrated are post amputation changes of all 5 digits at the level of the distal metatarsals. The underlying bone margins are sharp and defined on the PA and oblique images. On the lateral image, one of the bone margins is indistinct ventrally with improvement. IMPRESSION: 1. Interval decrease in size of the air containing ulcer in  the plantar aspect of the distal foot. 2. Continued indistinct margins of one of the distal metatarsals ventrally at the amputation site. Although this has improved since 03/01/2023, osteomyelitis can not be excluded at this location. This could be best evaluated with pre and postcontrast MRI of the foot.  Electronically Signed   By: Elspeth Bathe M.D.   On: 01/16/2024 11:16   DG Chest Port 1 View Result Date: 01/16/2024 CLINICAL DATA:  Generalized weakness and vomiting. EXAM: PORTABLE CHEST 1 VIEW COMPARISON:  05/09/2023 FINDINGS: Normal-sized heart. Clear lungs with normal vascularity. Lower thoracic spine degenerative changes. IMPRESSION: No acute abnormality. Electronically Signed   By: Elspeth Bathe M.D.   On: 01/16/2024 11:09      LOS: 2 days    Elgin Lam, MD [facility] 01/18/2024, 9:23 AM   If 7PM-7AM, please contact night-coverage [email or web address]

## 2024-01-18 NOTE — H&P (View-Only) (Signed)
 ORTHOPAEDIC CONSULTATION  REQUESTING PHYSICIAN: Briana Elgin LABOR, MD  Chief Complaint: Ulcerative pain difficulty walking right foot  HPI: [name] is a 73 y.o. male who presents with ulceration beneath the medial column right foot status post transmetatarsal amputation.  Patient states he has difficulty ambulation.  Past Medical History:  Diagnosis Date   Anemia    Anxiety    Arthritis    Colon polyps    Depression    Diabetes mellitus without complication (HCC)    DM (diabetes mellitus) (HCC) 01/30/2019   Fatty liver    GERD (gastroesophageal reflux disease)    Gout    High cholesterol    History of echocardiogram 09/14/2011   EF >55%; borderline concentric LVH;    History of stress test 06/21/2010   exercise; normal study   Hypertension    MET TEST 08/15/2011   moderate peak VO2 limitation at 66% predicted; mod cardiac impairment with low SV & low anaerobic threshold, mild chronotropic incompetence; low risk   Nausea    Osteomyelitis (HCC)    right great toe   Renal disorder    Renal insufficiency    Speech impediment    Thrombocytopenia    PMH   Past Surgical History:  Procedure Laterality Date   AMPUTATION Right 02/13/2019   Procedure: RIGHT GREAT TOE AMPUTATION;  Surgeon: Harden Jerona GAILS, MD;  Location: [facility] OR;  Service: Orthopedics;  Laterality: Right;   AMPUTATION Right 02/04/2022   Procedure: RIGHT TRANSMETATARSAL AMPUTATION APPLICATION OF WOUND VAC;  Surgeon: Harden Jerona GAILS, MD;  Location: [facility] OR;  Service: Orthopedics;  Laterality: Right;   AMPUTATION TOE Left 12/02/2021   Procedure: AMPUTATION TOE;  Surgeon: Mavis Anes, MD;  Location: [facility] ORS;  Service: General;  Laterality: Left;   AMPUTATION TOE Left 12/06/2021   Procedure: CLOSURE OF LEFT GREAT TOE AMPUTATION;  Surgeon: Mavis Anes, MD;  Location: [facility] ORS;  Service: General;  Laterality: Left;   CHOLECYSTECTOMY     COLONOSCOPY N/A 09/21/2012   Procedure: COLONOSCOPY;  Surgeon: Claudis RAYMOND Rivet, MD;  Location: [facility] SU[facility];  Service: Endoscopy;  Laterality: N/A;  730   COLONOSCOPY N/A 01/30/2019   rehman, eight 4-7 mm polyps in rectum in sigmoid and at splenic flexure, transverse colon and in cecum, one 9mm polyp in recto-sigmoid colon, clip was placed, nine polyps total removed   ESOPHAGOGASTRODUODENOSCOPY  11/2005   normal mucosa and GE junction   HERNIA REPAIR     x 2   HIP SURGERY     Rt hip age 9   NASAL POLYP SURGERY      2-3 months ago   POLYPECTOMY  01/30/2019   Procedure: POLYPECTOMY;  Surgeon: Rivet Claudis RAYMOND, MD;  Location: [facility];  Service: Endoscopy;;  proximal transverse colon, cecal   Social History   Socioeconomic History   Marital status: Single    Spouse name: Not on file   Number of children: Not on file   Years of education: Not on file   Highest education level: Not on file  Occupational History   Not on file  Tobacco Use   Smoking status: Never   Smokeless tobacco: Never  Vaping Use   Vaping status: Never Used  Substance and Sexual Activity   Alcohol use: No   Drug use: No   Sexual activity: Not on file  Other Topics Concern   Not on file  Social History Narrative   Not on file   Social Drivers  of Health   Financial Resource Strain: Low Risk  (12/13/2021)   Overall Financial Resource Strain (CARDIA)    Difficulty of Paying Living Expenses: Not hard at all  Food Insecurity: No Food Insecurity (01/16/2024)   Hunger Vital Sign    Worried About Running Out of Food in the Last Year: Never true    Ran Out of Food in the Last Year: Never true  Transportation Needs: No Transportation Needs (01/16/2024)   PRAPARE - Administrator, Civil Service (Medical): No    Lack of Transportation (Non-Medical): No  Physical Activity: Not on file  Stress: Not on file  Social Connections: Socially Isolated (01/16/2024)   Social Connection and Isolation Panel    Frequency of Communication with Friends and Family: More than three  times a week    Frequency of Social Gatherings with Friends and Family: More than three times a week    Attends Religious Services: Never    Database Administrator or Organizations: No    Attends Engineer, Structural: Never    Marital Status: Never married   Family History  Problem Relation Age of Onset   Colon cancer Brother    - negative except otherwise stated in the family history section Allergies  Allergen Reactions   Ivp Dye [Iodinated Contrast Media] Other (See Comments)    Speed heart rate up    Penicillins Rash    Pt tolerated amoxicillin  oral challenge   Tetanus Toxoid-Containing Vaccines Rash   Prior to Admission medications   Medication Sig Start Date End Date Taking? Authorizing Provider  acetaminophen  (TYLENOL ) 500 MG tablet Take 2 tablets (1,000 mg total) by mouth every 8 (eight) hours as needed for mild pain (pain score 1-3) or moderate pain (pain score 4-6). Pain 06/03/23  Yes Beatty, Celeste A, PA-C  atorvastatin  (LIPITOR) 10 MG tablet Take 10 mg by mouth at bedtime. 09/21/17  Yes [provider]  B Complex-C (B-COMPLEX WITH VITAMIN C) tablet Take 1 tablet by mouth daily.   Yes [provider]  buPROPion  (WELLBUTRIN  XL) 150 MG 24 hr tablet Take 150 mg by mouth at bedtime.  08/29/12  Yes [provider]  Cholecalciferol  (VITAMIN D ) 50 MCG (2000 UT) CAPS Take 2,000 Units by mouth daily.    Yes [provider]  glimepiride (AMARYL) 4 MG tablet Take 4 mg by mouth in the morning and at bedtime. 11/04/23  Yes [provider]  hydrALAZINE  (APRESOLINE ) 25 MG tablet TAKE 1 TABLET BY MOUTH IN THE MORNING AND AT BEDTIME. MAY TAKE ADDITIONAL AS NEEDED FOR SYSTOLIC BLOOD PRESSURE CONSISTENTLY ABOVE 140. Patient taking differently: Take 25 mg by mouth in the morning and at bedtime. 04/24/23  Yes BranchDorn FALCON, MD  metoprolol  tartrate (LOPRESSOR ) 100 MG tablet Take 100 mg by mouth daily. 12/03/23  Yes [provider]   omeprazole  (PRILOSEC) 20 MG capsule TAKE ONE CAPSULE BY MOUTH DAILY. NEEDS OFFICE VISIT FOR FURTHER REFILLS. Patient taking differently: Take 20 mg by mouth daily. TAKE ONE CAPSULE BY MOUTH DAILY. NEEDS OFFICE VISIT FOR FURTHER REFILLS. 01/24/22  Yes Carlan, Chelsea L, NP  sertraline  (ZOLOFT ) 100 MG tablet Take 100 mg by mouth at bedtime.  05/09/15  Yes [provider]  sodium bicarbonate  650 MG tablet Take 650 mg by mouth 2 (two) times daily. 07/18/17  Yes [provider]  ULORIC  80 MG TABS Take 80 mg by mouth daily.  04/21/15  Yes [provider]  ACCU-CHEK GUIDE test  strip USE AS DIRECTED TO TEST TWICE DAILY 02/01/22   [provider]  TOUJEO  MAX SOLOSTAR 300 UNIT/ML Solostar Pen Inject 20 Units into the skin daily. Patient not taking: Reported on 01/16/2024 03/04/23   Ricky Fines, MD   MR FOOT RIGHT WO CONTRAST Result Date: 01/16/2024 EXAM: MRI of the right Foot without contrast. 01/16/2024 02:09:27 PM TECHNIQUE: Multiplanar multisequence MRI of the right foot was performed without the administration of intravenous contrast. COMPARISON: Foot radiographs 01/16/2024. CLINICAL HISTORY: Right foot pain, possible osteomyelitis, history of amputations. FINDINGS: LISFRANC JOINT: Visualized Lisfranc ligament is intact. No significant Lisfranc interval widening or significant periligamentous edema. BONE MARROW: Prior transmetatarsal amputation. Scalloped/eroded distal margin of the 1st metatarsal with mild but increased marrow edema signal in the underlying shaft and proximal metaphysis of the 1st metatarsal compared to 05/03/2023, suggesting chronic osteomyelitis. Low level marrow edema in the mediocuneiform and in the navicular, probably related to arthropathy but technically nonspecific. Plantar calcaneal spur. SOFT TISSUES: Cutaneous and subcutaneous ulceration along the plantar-medial side of the amputation. Highly irregular and indistinct plantar fascia, as well as new  edema and infiltration in the previously fatty flexor digitorum brevis and abductor digiti minimi muscles raising the possibility of plantar fascia rupture with surrounding edema. The remaining musculature of the foot is atrophic. TENDONS: Distal Achilles tendinopathy with thickening of the Achilles tendon and spurring from the calcaneus extending in the superficial distal margin of the Achilles tendon. Tibialis posterior and flexor digitorum longus tenosynovitis. OTHER ARTICULATIONS: Small dorsal effusion of the talonavicular articulation. Small lateral effusion of the calcaneocuboid joint and the articulation between the cuboid and 5th metatarsal base. IMPRESSION: 1. 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 2. Possible plantar fascia rupture with surrounding fluid/edema involving the otherwise atrophic flexor digitorum brevis and abductor digiti minimi muscles. 3. Small effusions in the talonavicular, calcaneocuboid, and cuboidfifth metatarsal base articulations. 4. Distal Achilles tendinopathy with spurring and superficial extension. 5. Tibialis posterior and flexor digitorum longus tenosynovitis. Electronically signed by: Ryan Salvage MD 01/16/2024 02:34 PM EST RP Workstation: [id number]   DG Foot Complete Right Result Date: 01/16/2024 CLINICAL DATA:  Right foot plantar ulcer with associated erythema. EXAM: RIGHT FOOT COMPLETE - 3+ VIEW COMPARISON:  03/01/2023 and right foot MRI dated 05/03/2023 FINDINGS: Diffuse soft tissue swelling with an interval decrease in size of the air containing ulcer in the plantar aspect of the distal foot. No separate soft tissue gas and no interval bone destruction and periosteal reaction. Again demonstrated are post amputation changes of all 5 digits at the level of the distal metatarsals. The underlying bone margins are sharp and defined on the PA and oblique images. On the lateral  image, one of the bone margins is indistinct ventrally with improvement. IMPRESSION: 1. Interval decrease in size of the air containing ulcer in the plantar aspect of the distal foot. 2. Continued indistinct margins of one of the distal metatarsals ventrally at the amputation site. Although this has improved since 03/01/2023, osteomyelitis can not be excluded at this location. This could be best evaluated with pre and postcontrast MRI of the foot. Electronically Signed   By: Elspeth Bathe M.D.   On: 01/16/2024 11:16   DG Chest Port 1 View Result Date: 01/16/2024 CLINICAL DATA:  Generalized weakness and vomiting. EXAM: PORTABLE CHEST 1 VIEW COMPARISON:  05/09/2023 FINDINGS: Normal-sized heart. Clear lungs with normal vascularity. Lower thoracic spine degenerative changes. IMPRESSION: No acute abnormality. Electronically Signed   By:  Elspeth Bathe M.D.   On: 01/16/2024 11:09   - pertinent xrays, CT, MRI studies were reviewed and independently interpreted  Positive ROS: All other systems have been reviewed and were otherwise negative with the exception of those mentioned in the HPI and as above.  Physical Exam: General: Alert, no acute distress Psychiatric: Patient is competent for consent with normal mood and affect Lymphatic: No axillary or cervical lymphadenopathy Cardiovascular: No pedal edema Respiratory: No cyanosis, no use of accessory musculature GI: No organomegaly, abdomen is soft and non-tender    Images:  @ENCIMAGES @  Labs:  Lab Results  Component Value Date   HGBA1C 9.8 (H) 01/17/2024   HGBA1C 8.1 (H) 05/03/2023   HGBA1C 10.7 (H) 03/01/2023   ESRSEDRATE 78 (H) 01/16/2024   ESRSEDRATE 16 03/02/2023   ESRSEDRATE 107 (H) 11/30/2021   CRP 17.9 (H) 01/16/2024   CRP <0.5 03/02/2023   CRP 22.9 (H) 11/30/2021   LABURIC 6.9 01/09/2019   REPTSTATUS PENDING 01/16/2024   REPTSTATUS PENDING 01/16/2024   GRAMSTAIN  12/02/2021    ABUNDANT WBC PRESENT,BOTH PMN AND  MONONUCLEAR ABUNDANT GRAM NEGATIVE RODS FEW GRAM POSITIVE COCCI IN PAIRS IN CHAINS    CULT  01/16/2024    NO GROWTH < 24 HOURS Performed at [facility], [address] [place], [place] [address]    CULT  01/16/2024    NO GROWTH < 24 HOURS Performed at [facility], [address] [place], [place] [address]    [facility] ENTEROCOCCUS FAECALIS 12/02/2021   LABORGA STREPTOCOCCUS MITIS/ORALIS 12/02/2021   LABORGA PROTEUS PENNERI 12/02/2021    Lab Results  Component Value Date   ALBUMIN 4.1 01/16/2024   ALBUMIN 3.6 06/03/2023   ALBUMIN 3.2 (L) 05/05/2023   LABURIC 6.9 01/09/2019        Latest Ref Rng & Units 01/17/2024    4:15 AM 01/16/2024   10:37 AM 06/03/2023    6:38 PM  CBC EXTENDED  WBC 4.0 - 10.5 K/uL 16.2  16.0  8.4   RBC 4.22 - 5.81 MIL/uL 3.52  4.72  4.49   Hemoglobin 13.0 - 17.0 g/dL 9.5  87.2  86.5   HCT 60.9 - 52.0 % 28.8  38.4  39.3   Platelets 150 - 400 K/uL 217  279  219   NEUT# 1.7 - 7.7 K/uL   6.7   Lymph# 0.7 - 4.0 K/uL   0.8     Neurologic: Patient does not have protective sensation bilateral lower extremities.   MUSCULOSKELETAL:   Skin: Examination there is a ulcer beneath the medial column right foot.  Review of the MRI scan shows osteomyelitis of the first metatarsal directly beneath the ulceration.  Hemoglobin 9.5 with a white cell count of 16.2.  Hemoglobin A1c 9.8 with a sed rate of 78 and a C-reactive protein of 17.9.  Patient has palpable pulses.  Assessment: Assessment: Osteomyelitis and ulceration right first metatarsal with transmetatarsal amputation  Plan: Will plan for first ray resection tomorrow, Friday.  Thank you for the consult and the opportunity to see Mr. Adham Johnson, MD [facility] [phone] 8:13 AM

## 2024-01-18 NOTE — Plan of Care (Signed)
  Problem: Fluid Volume: Goal: Ability to maintain a balanced intake and output will improve Outcome: Progressing   Problem: Metabolic: Goal: Ability to maintain appropriate glucose levels will improve Outcome: Progressing   Problem: Nutritional: Goal: Maintenance of adequate nutrition will improve Outcome: Progressing   Problem: Tissue Perfusion: Goal: Adequacy of tissue perfusion will improve Outcome: Progressing   Problem: Clinical Measurements: Goal: Diagnostic test results will improve Outcome: Progressing Goal: Respiratory complications will improve Outcome: Progressing Goal: Cardiovascular complication will be avoided Outcome: Progressing   Problem: Activity: Goal: Risk for activity intolerance will decrease Outcome: Progressing   Problem: Nutrition: Goal: Adequate nutrition will be maintained Outcome: Progressing   Problem: Coping: Goal: Level of anxiety will decrease Outcome: Progressing   Problem: Elimination: Goal: Will not experience complications related to bowel motility Outcome: Progressing Goal: Will not experience complications related to urinary retention Outcome: Progressing   Problem: Pain Managment: Goal: General experience of comfort will improve and/or be controlled Outcome: Progressing   Problem: Safety: Goal: Ability to remain free from injury will improve Outcome: Progressing   Problem: Skin Integrity: Goal: Risk for impaired skin integrity will decrease Outcome: Progressing

## 2024-01-18 NOTE — Plan of Care (Signed)
  Problem: Education: Goal: Ability to describe self-care measures that may prevent or decrease complications (Diabetes Survival Skills Education) will improve Outcome: Progressing   Problem: Coping: Goal: Ability to adjust to condition or change in health will improve Outcome: Progressing   Problem: Education: Goal: Knowledge of General Education information will improve Description: Including pain rating scale, medication(s)/side effects and non-pharmacologic comfort measures Outcome: Progressing   

## 2024-01-18 NOTE — Progress Notes (Signed)
 Pharmacy Antibiotic Note  [name] is a 73 y.o. male admitted on 01/16/2024 with RLE OM.  Pharmacy has been consulted for vancomycin  dosing.  12/4 Vancomycin  1250mg  Q 24 hr with Est AUC: 507 Scr used: 2.42 mg/dL; Vd coeff: 0.72 L/kg Vancomycin  1500mg  bag just hung. Asked RN to stop it around 13:20 so patient receives closer to 1200mg  instead of the full dose given worsened renal function.   Plan: Decrease vancomycin  1250mg  q24hr  Ceftriaxone  2g q24hr Monitor cultures, clinical status, renal function, vancomycin  level Narrow abx as able and f/u duration    Height: 6' 5 (195.6 cm) Weight: 103 kg (227 lb 1.2 oz) IBW/kg (Calculated) : 89.1  Temp (24hrs), Avg:99.5 F (37.5 C), Min:98.5 F (36.9 C), Max:103.1 F (39.5 C)  Recent Labs  Lab 01/16/24 1037 01/16/24 1100 01/17/24 0415 01/18/24 1042  WBC 16.0*  --  16.2* 16.9*  CREATININE 1.83*  --  1.93* 2.42*  LATICACIDVEN  --  1.7  --   --     Estimated Creatinine Clearance: 34.8 mL/min (A) (by C-G formula based on SCr of 2.42 mg/dL (H)).    Allergies  Allergen Reactions   Ivp Dye [Iodinated Contrast Media] Other (See Comments)    Speed heart rate up    Penicillins Rash    Pt tolerated amoxicillin  oral challenge   Tetanus Toxoid-Containing Vaccines Rash    Antimicrobials this admission: vanc 12/2 >>  CTX 12/2 >>  MTZ 12/2   Dose adjustments this admission: 12/4 decrease vancomycin  1500> 1250mg  Q24hr  Microbiology results: 12/2 BCx: ngtd 12/2 GI panel:    Thank you for allowing pharmacy to be a part of this patient's care.  Jinnie Door, PharmD, BCPS, BCCP Clinical Pharmacist  Please check AMION for all [facility] Pharmacy phone numbers After 10:00 PM, call Main Pharmacy [phone]

## 2024-01-18 NOTE — Consult Note (Signed)
 ORTHOPAEDIC CONSULTATION  REQUESTING PHYSICIAN: Briana Elgin LABOR, MD  Chief Complaint: Ulcerative pain difficulty walking right foot  HPI: [name] is a 73 y.o. male who presents with ulceration beneath the medial column right foot status post transmetatarsal amputation.  Patient states he has difficulty ambulation.  Past Medical History:  Diagnosis Date   Anemia    Anxiety    Arthritis    Colon polyps    Depression    Diabetes mellitus without complication (HCC)    DM (diabetes mellitus) (HCC) 01/30/2019   Fatty liver    GERD (gastroesophageal reflux disease)    Gout    High cholesterol    History of echocardiogram 09/14/2011   EF >55%; borderline concentric LVH;    History of stress test 06/21/2010   exercise; normal study   Hypertension    MET TEST 08/15/2011   moderate peak VO2 limitation at 66% predicted; mod cardiac impairment with low SV & low anaerobic threshold, mild chronotropic incompetence; low risk   Nausea    Osteomyelitis (HCC)    right great toe   Renal disorder    Renal insufficiency    Speech impediment    Thrombocytopenia    PMH   Past Surgical History:  Procedure Laterality Date   AMPUTATION Right 02/13/2019   Procedure: RIGHT GREAT TOE AMPUTATION;  Surgeon: Harden Jerona GAILS, MD;  Location: [facility] OR;  Service: Orthopedics;  Laterality: Right;   AMPUTATION Right 02/04/2022   Procedure: RIGHT TRANSMETATARSAL AMPUTATION APPLICATION OF WOUND VAC;  Surgeon: Harden Jerona GAILS, MD;  Location: [facility] OR;  Service: Orthopedics;  Laterality: Right;   AMPUTATION TOE Left 12/02/2021   Procedure: AMPUTATION TOE;  Surgeon: Mavis Anes, MD;  Location: [facility] ORS;  Service: General;  Laterality: Left;   AMPUTATION TOE Left 12/06/2021   Procedure: CLOSURE OF LEFT GREAT TOE AMPUTATION;  Surgeon: Mavis Anes, MD;  Location: [facility];  Service: General;  Laterality: Left;   CHOLECYSTECTOMY     COLONOSCOPY N/A 09/21/2012   Procedure: COLONOSCOPY;  Surgeon: Claudis RAYMOND Rivet, MD;  Location: [facility];  Service: Endoscopy;  Laterality: N/A;  730   COLONOSCOPY N/A 01/30/2019   rehman, eight 4-7 mm polyps in rectum in sigmoid and at splenic flexure, transverse colon and in cecum, one 9mm polyp in recto-sigmoid colon, clip was placed, nine polyps total removed   ESOPHAGOGASTRODUODENOSCOPY  11/2005   normal mucosa and GE junction   HERNIA REPAIR     x 2   HIP SURGERY     Rt hip age 66   NASAL POLYP SURGERY      2-3 months ago   POLYPECTOMY  01/30/2019   Procedure: POLYPECTOMY;  Surgeon: Rivet Claudis RAYMOND, MD;  Location: [facility];  Service: Endoscopy;;  proximal transverse colon, cecal   Social History   Socioeconomic History   Marital status: Single    Spouse name: Not on file   Number of children: Not on file   Years of education: Not on file   Highest education level: Not on file  Occupational History   Not on file  Tobacco Use   Smoking status: Never   Smokeless tobacco: Never  Vaping Use   Vaping status: Never Used  Substance and Sexual Activity   Alcohol use: No   Drug use: No   Sexual activity: Not on file  Other Topics Concern   Not on file  Social History Narrative   Not on file   Social Drivers  of Health   Financial Resource Strain: Low Risk  (12/13/2021)   Overall Financial Resource Strain (CARDIA)    Difficulty of Paying Living Expenses: Not hard at all  Food Insecurity: No Food Insecurity (01/16/2024)   Hunger Vital Sign    Worried About Running Out of Food in the Last Year: Never true    Ran Out of Food in the Last Year: Never true  Transportation Needs: No Transportation Needs (01/16/2024)   PRAPARE - Administrator, Civil Service (Medical): No    Lack of Transportation (Non-Medical): No  Physical Activity: Not on file  Stress: Not on file  Social Connections: Socially Isolated (01/16/2024)   Social Connection and Isolation Panel    Frequency of Communication with Friends and Family: More than three  times a week    Frequency of Social Gatherings with Friends and Family: More than three times a week    Attends Religious Services: Never    Database Administrator or Organizations: No    Attends Engineer, Structural: Never    Marital Status: Never married   Family History  Problem Relation Age of Onset   Colon cancer Brother    - negative except otherwise stated in the family history section Allergies  Allergen Reactions   Ivp Dye [Iodinated Contrast Media] Other (See Comments)    Speed heart rate up    Penicillins Rash    Pt tolerated amoxicillin  oral challenge   Tetanus Toxoid-Containing Vaccines Rash   Prior to Admission medications   Medication Sig Start Date End Date Taking? Authorizing Provider  acetaminophen  (TYLENOL ) 500 MG tablet Take 2 tablets (1,000 mg total) by mouth every 8 (eight) hours as needed for mild pain (pain score 1-3) or moderate pain (pain score 4-6). Pain 06/03/23  Yes Beatty, Celeste A, PA-C  atorvastatin  (LIPITOR) 10 MG tablet Take 10 mg by mouth at bedtime. 09/21/17  Yes [provider]  B Complex-C (B-COMPLEX WITH VITAMIN C) tablet Take 1 tablet by mouth daily.   Yes [provider]  buPROPion  (WELLBUTRIN  XL) 150 MG 24 hr tablet Take 150 mg by mouth at bedtime.  08/29/12  Yes [provider]  Cholecalciferol  (VITAMIN D ) 50 MCG (2000 UT) CAPS Take 2,000 Units by mouth daily.    Yes [provider]  glimepiride (AMARYL) 4 MG tablet Take 4 mg by mouth in the morning and at bedtime. 11/04/23  Yes [provider]  hydrALAZINE  (APRESOLINE ) 25 MG tablet TAKE 1 TABLET BY MOUTH IN THE MORNING AND AT BEDTIME. MAY TAKE ADDITIONAL AS NEEDED FOR SYSTOLIC BLOOD PRESSURE CONSISTENTLY ABOVE 140. Patient taking differently: Take 25 mg by mouth in the morning and at bedtime. 04/24/23  Yes BranchDorn FALCON, MD  metoprolol  tartrate (LOPRESSOR ) 100 MG tablet Take 100 mg by mouth daily. 12/03/23  Yes [provider]   omeprazole  (PRILOSEC) 20 MG capsule TAKE ONE CAPSULE BY MOUTH DAILY. NEEDS OFFICE VISIT FOR FURTHER REFILLS. Patient taking differently: Take 20 mg by mouth daily. TAKE ONE CAPSULE BY MOUTH DAILY. NEEDS OFFICE VISIT FOR FURTHER REFILLS. 01/24/22  Yes Carlan, Chelsea L, NP  sertraline  (ZOLOFT ) 100 MG tablet Take 100 mg by mouth at bedtime.  05/09/15  Yes [provider]  sodium bicarbonate  650 MG tablet Take 650 mg by mouth 2 (two) times daily. 07/18/17  Yes [provider]  ULORIC  80 MG TABS Take 80 mg by mouth daily.  04/21/15  Yes [provider]  ACCU-CHEK GUIDE test  strip USE AS DIRECTED TO TEST TWICE DAILY 02/01/22   [provider]  TOUJEO  MAX SOLOSTAR 300 UNIT/ML Solostar Pen Inject 20 Units into the skin daily. Patient not taking: Reported on 01/16/2024 03/04/23   Ricky Fines, MD   MR FOOT RIGHT WO CONTRAST Result Date: 01/16/2024 EXAM: MRI of the right Foot without contrast. 01/16/2024 02:09:27 PM TECHNIQUE: Multiplanar multisequence MRI of the right foot was performed without the administration of intravenous contrast. COMPARISON: Foot radiographs 01/16/2024. CLINICAL HISTORY: Right foot pain, possible osteomyelitis, history of amputations. FINDINGS: LISFRANC JOINT: Visualized Lisfranc ligament is intact. No significant Lisfranc interval widening or significant periligamentous edema. BONE MARROW: Prior transmetatarsal amputation. Scalloped/eroded distal margin of the 1st metatarsal with mild but increased marrow edema signal in the underlying shaft and proximal metaphysis of the 1st metatarsal compared to 05/03/2023, suggesting chronic osteomyelitis. Low level marrow edema in the mediocuneiform and in the navicular, probably related to arthropathy but technically nonspecific. Plantar calcaneal spur. SOFT TISSUES: Cutaneous and subcutaneous ulceration along the plantar-medial side of the amputation. Highly irregular and indistinct plantar fascia, as well as new  edema and infiltration in the previously fatty flexor digitorum brevis and abductor digiti minimi muscles raising the possibility of plantar fascia rupture with surrounding edema. The remaining musculature of the foot is atrophic. TENDONS: Distal Achilles tendinopathy with thickening of the Achilles tendon and spurring from the calcaneus extending in the superficial distal margin of the Achilles tendon. Tibialis posterior and flexor digitorum longus tenosynovitis. OTHER ARTICULATIONS: Small dorsal effusion of the talonavicular articulation. Small lateral effusion of the calcaneocuboid joint and the articulation between the cuboid and 5th metatarsal base. IMPRESSION: 1. 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 2. Possible plantar fascia rupture with surrounding fluid/edema involving the otherwise atrophic flexor digitorum brevis and abductor digiti minimi muscles. 3. Small effusions in the talonavicular, calcaneocuboid, and cuboidfifth metatarsal base articulations. 4. Distal Achilles tendinopathy with spurring and superficial extension. 5. Tibialis posterior and flexor digitorum longus tenosynovitis. Electronically signed by: Ryan Salvage MD 01/16/2024 02:34 PM EST RP Workstation: [id number]   DG Foot Complete Right Result Date: 01/16/2024 CLINICAL DATA:  Right foot plantar ulcer with associated erythema. EXAM: RIGHT FOOT COMPLETE - 3+ VIEW COMPARISON:  03/01/2023 and right foot MRI dated 05/03/2023 FINDINGS: Diffuse soft tissue swelling with an interval decrease in size of the air containing ulcer in the plantar aspect of the distal foot. No separate soft tissue gas and no interval bone destruction and periosteal reaction. Again demonstrated are post amputation changes of all 5 digits at the level of the distal metatarsals. The underlying bone margins are sharp and defined on the PA and oblique images. On the lateral  image, one of the bone margins is indistinct ventrally with improvement. IMPRESSION: 1. Interval decrease in size of the air containing ulcer in the plantar aspect of the distal foot. 2. Continued indistinct margins of one of the distal metatarsals ventrally at the amputation site. Although this has improved since 03/01/2023, osteomyelitis can not be excluded at this location. This could be best evaluated with pre and postcontrast MRI of the foot. Electronically Signed   By: Elspeth Bathe M.D.   On: 01/16/2024 11:16   DG Chest Port 1 View Result Date: 01/16/2024 CLINICAL DATA:  Generalized weakness and vomiting. EXAM: PORTABLE CHEST 1 VIEW COMPARISON:  05/09/2023 FINDINGS: Normal-sized heart. Clear lungs with normal vascularity. Lower thoracic spine degenerative changes. IMPRESSION: No acute abnormality. Electronically Signed   By:  Elspeth Bathe M.D.   On: 01/16/2024 11:09   - pertinent xrays, CT, MRI studies were reviewed and independently interpreted  Positive ROS: All other systems have been reviewed and were otherwise negative with the exception of those mentioned in the HPI and as above.  Physical Exam: General: Alert, no acute distress Psychiatric: Patient is competent for consent with normal mood and affect Lymphatic: No axillary or cervical lymphadenopathy Cardiovascular: No pedal edema Respiratory: No cyanosis, no use of accessory musculature GI: No organomegaly, abdomen is soft and non-tender    Images:  @ENCIMAGES @  Labs:  Lab Results  Component Value Date   HGBA1C 9.8 (H) 01/17/2024   HGBA1C 8.1 (H) 05/03/2023   HGBA1C 10.7 (H) 03/01/2023   ESRSEDRATE 78 (H) 01/16/2024   ESRSEDRATE 16 03/02/2023   ESRSEDRATE 107 (H) 11/30/2021   CRP 17.9 (H) 01/16/2024   CRP <0.5 03/02/2023   CRP 22.9 (H) 11/30/2021   LABURIC 6.9 01/09/2019   REPTSTATUS PENDING 01/16/2024   REPTSTATUS PENDING 01/16/2024   GRAMSTAIN  12/02/2021    ABUNDANT WBC PRESENT,BOTH PMN AND  MONONUCLEAR ABUNDANT GRAM NEGATIVE RODS FEW GRAM POSITIVE COCCI IN PAIRS IN CHAINS    CULT  01/16/2024    NO GROWTH < 24 HOURS Performed at [facility], [address] [place], [place] [address]    CULT  01/16/2024    NO GROWTH < 24 HOURS Performed at [facility], [address] [place], [place] [address]    [facility] ENTEROCOCCUS FAECALIS 12/02/2021   LABORGA STREPTOCOCCUS MITIS/ORALIS 12/02/2021   LABORGA PROTEUS PENNERI 12/02/2021    Lab Results  Component Value Date   ALBUMIN 4.1 01/16/2024   ALBUMIN 3.6 06/03/2023   ALBUMIN 3.2 (L) 05/05/2023   LABURIC 6.9 01/09/2019        Latest Ref Rng & Units 01/17/2024    4:15 AM 01/16/2024   10:37 AM 06/03/2023    6:38 PM  CBC EXTENDED  WBC 4.0 - 10.5 K/uL 16.2  16.0  8.4   RBC 4.22 - 5.81 MIL/uL 3.52  4.72  4.49   Hemoglobin 13.0 - 17.0 g/dL 9.5  87.2  86.5   HCT 60.9 - 52.0 % 28.8  38.4  39.3   Platelets 150 - 400 K/uL 217  279  219   NEUT# 1.7 - 7.7 K/uL   6.7   Lymph# 0.7 - 4.0 K/uL   0.8     Neurologic: Patient does not have protective sensation bilateral lower extremities.   MUSCULOSKELETAL:   Skin: Examination there is a ulcer beneath the medial column right foot.  Review of the MRI scan shows osteomyelitis of the first metatarsal directly beneath the ulceration.  Hemoglobin 9.5 with a white cell count of 16.2.  Hemoglobin A1c 9.8 with a sed rate of 78 and a C-reactive protein of 17.9.  Patient has palpable pulses.  Assessment: Assessment: Osteomyelitis and ulceration right first metatarsal with transmetatarsal amputation  Plan: Will plan for first ray resection tomorrow, Friday.  Thank you for the consult and the opportunity to see Mr. Adham Johnson, MD [facility] [phone] 8:13 AM

## 2024-01-19 ENCOUNTER — Inpatient Hospital Stay (HOSPITAL_COMMUNITY): Admitting: Registered Nurse

## 2024-01-19 ENCOUNTER — Encounter (HOSPITAL_COMMUNITY)
Admission: EM | Disposition: A | Discharge status: Skilled Nursing Facility | Payer: Self-pay | Source: Home / Self Care | Attending: Family Medicine

## 2024-01-19 ENCOUNTER — Encounter (HOSPITAL_COMMUNITY): Payer: Self-pay | Admitting: Internal Medicine

## 2024-01-19 DIAGNOSIS — A419 Sepsis, unspecified organism: Secondary | ICD-10-CM | POA: Diagnosis not present

## 2024-01-19 DIAGNOSIS — M86171 Other acute osteomyelitis, right ankle and foot: Secondary | ICD-10-CM | POA: Diagnosis not present

## 2024-01-19 HISTORY — PX: AMPUTATION: SHX166

## 2024-01-19 LAB — BASIC METABOLIC PANEL WITH GFR
Anion gap: 12 (ref 5–15)
BUN: 33 mg/dL — ABNORMAL HIGH (ref 8–23)
CO2: 23 mmol/L (ref 22–32)
Calcium: 7.9 mg/dL — ABNORMAL LOW (ref 8.9–10.3)
Chloride: 101 mmol/L (ref 98–111)
Creatinine, Ser: 2.35 mg/dL — ABNORMAL HIGH (ref 0.61–1.24)
GFR, Estimated: 29 mL/min — ABNORMAL LOW (ref >60)
Glucose, Bld: 138 mg/dL — ABNORMAL HIGH (ref 70–99)
Potassium: 3.2 mmol/L — ABNORMAL LOW (ref 3.5–5.1)
Sodium: 136 mmol/L (ref 135–145)

## 2024-01-19 LAB — CBC
HCT: 29.2 % — ABNORMAL LOW (ref 39.0–52.0)
Hemoglobin: 9.6 g/dL — ABNORMAL LOW (ref 13.0–17.0)
MCH: 26.4 pg (ref 26.0–34.0)
MCHC: 32.9 g/dL (ref 30.0–36.0)
MCV: 80.4 fL (ref 80.0–100.0)
Platelets: 242 K/uL (ref 150–400)
RBC: 3.63 MIL/uL — ABNORMAL LOW (ref 4.22–5.81)
RDW: 14.6 % (ref 11.5–15.5)
WBC: 12.9 K/uL — ABNORMAL HIGH (ref 4.0–10.5)
nRBC: 0 % (ref 0.0–0.2)

## 2024-01-19 LAB — GASTROINTESTINAL PANEL BY PCR, STOOL (REPLACES STOOL CULTURE)

## 2024-01-19 LAB — GLUCOSE, CAPILLARY
Glucose-Capillary: 144 mg/dL — ABNORMAL HIGH (ref 70–99)
Glucose-Capillary: 123 mg/dL — ABNORMAL HIGH (ref 70–99)
Glucose-Capillary: 135 mg/dL — ABNORMAL HIGH (ref 70–99)
Glucose-Capillary: 143 mg/dL — ABNORMAL HIGH (ref 70–99)
Glucose-Capillary: 109 mg/dL — ABNORMAL HIGH (ref 70–99)
Glucose-Capillary: 199 mg/dL — ABNORMAL HIGH (ref 70–99)

## 2024-01-19 SURGERY — AMPUTATION, FOOT, RAY
Anesthesia: Choice | Laterality: Right

## 2024-01-19 MED ORDER — 0.9 % SODIUM CHLORIDE (POUR BTL) OPTIME
TOPICAL | Status: DC | PRN
  Administered 2024-01-19: 1000 mL

## 2024-01-19 MED ORDER — PHENYLEPHRINE 80 MCG/ML (10ML) SYRINGE FOR IV PUSH (FOR BLOOD PRESSURE SUPPORT)
PREFILLED_SYRINGE | INTRAVENOUS | Status: DC | PRN
  Administered 2024-01-19: 80 ug via INTRAVENOUS

## 2024-01-19 MED ORDER — LIDOCAINE 2% (20 MG/ML) 5 ML SYRINGE
INTRAMUSCULAR | Status: DC | PRN
  Administered 2024-01-19: 40 mg via INTRAVENOUS

## 2024-01-19 MED ORDER — FENTANYL CITRATE (PF) 100 MCG/2ML IJ SOLN
INTRAMUSCULAR | Status: AC
  Filled 2024-01-19: qty 2

## 2024-01-19 MED ORDER — CHLORHEXIDINE GLUCONATE 0.12 % MT SOLN
OROMUCOSAL | Status: AC
  Filled 2024-01-19: qty 15

## 2024-01-19 MED ORDER — FENTANYL CITRATE (PF) 100 MCG/2ML IJ SOLN
INTRAMUSCULAR | Status: AC
  Administered 2024-01-19: 50 ug
  Filled 2024-01-19: qty 2

## 2024-01-19 MED ORDER — LIDOCAINE 2% (20 MG/ML) 5 ML SYRINGE
INTRAMUSCULAR | Status: AC
  Filled 2024-01-19: qty 5

## 2024-01-19 MED ORDER — ONDANSETRON HCL 4 MG/2ML IJ SOLN
INTRAMUSCULAR | Status: DC | PRN
  Administered 2024-01-19: 4 mg via INTRAVENOUS

## 2024-01-19 MED ORDER — PROPOFOL 10 MG/ML IV BOLUS
INTRAVENOUS | Status: DC | PRN
  Administered 2024-01-19: 75 ug/kg/min via INTRAVENOUS
  Administered 2024-01-19: 30 mg via INTRAVENOUS

## 2024-01-19 MED ORDER — SODIUM CHLORIDE 0.9 % IV SOLN: INTRAVENOUS | Status: DC

## 2024-01-19 MED ORDER — MIDAZOLAM HCL 2 MG/2ML IJ SOLN
INTRAMUSCULAR | Status: DC
  Filled 2024-01-19: qty 2

## 2024-01-19 MED ORDER — PHENYLEPHRINE 80 MCG/ML (10ML) SYRINGE FOR IV PUSH (FOR BLOOD PRESSURE SUPPORT)
PREFILLED_SYRINGE | INTRAVENOUS | Status: AC
  Filled 2024-01-19: qty 10

## 2024-01-19 MED ORDER — POVIDONE-IODINE 10 % EX SWAB
2.0000 | Freq: Once | CUTANEOUS | Status: AC
  Administered 2024-01-19: 2 via TOPICAL

## 2024-01-19 MED ORDER — ONDANSETRON HCL 4 MG/2ML IJ SOLN
INTRAMUSCULAR | Status: AC
  Filled 2024-01-19: qty 2

## 2024-01-19 MED ORDER — ORAL CARE MOUTH RINSE: 15.0000 mL | Freq: Once | OROMUCOSAL | Status: DC

## 2024-01-19 MED ORDER — POTASSIUM CHLORIDE 10 MEQ/100ML IV SOLN
10.0000 meq | INTRAVENOUS | Status: AC
  Administered 2024-01-19 (×2): 10 meq via INTRAVENOUS
  Filled 2024-01-19 (×2): qty 100

## 2024-01-19 MED ORDER — VASHE WOUND IRRIGATION OPTIME
TOPICAL | Status: DC | PRN
  Administered 2024-01-19: 34 [oz_av]

## 2024-01-19 MED ORDER — FENTANYL CITRATE (PF) 100 MCG/2ML IJ SOLN: 50.0000 ug | Freq: Once | INTRAMUSCULAR | Status: DC

## 2024-01-19 MED ORDER — CHLORHEXIDINE GLUCONATE 0.12 % MT SOLN: 15.0000 mL | Freq: Once | OROMUCOSAL | Status: DC

## 2024-01-19 MED ORDER — CHLORHEXIDINE GLUCONATE 4 % EX SOLN: 60.0000 mL | Freq: Once | CUTANEOUS | Status: DC

## 2024-01-19 SURGICAL SUPPLY — 27 items
BAG COUNTER SPONGE SURGICOUNT (BAG) ×2 IMPLANT
BLADE SAW SGTL MED 73X18.5 STR (BLADE) IMPLANT
BLADE SURG 21 STRL SS (BLADE) ×2 IMPLANT
BNDG COHESIVE 4X5 TAN STRL LF (GAUZE/BANDAGES/DRESSINGS) ×2 IMPLANT
BNDG ELASTIC 4INX 5YD STR LF (GAUZE/BANDAGES/DRESSINGS) IMPLANT
BNDG GAUZE DERMACEA FLUFF 4 (GAUZE/BANDAGES/DRESSINGS) ×2 IMPLANT
COVER SURGICAL LIGHT HANDLE (MISCELLANEOUS) ×4 IMPLANT
DRAPE U-SHAPE 47X51 STRL (DRAPES) ×4 IMPLANT
DRSG ADAPTIC 3X8 NADH LF (GAUZE/BANDAGES/DRESSINGS) ×2 IMPLANT
DURAPREP 26ML APPLICATOR (WOUND CARE) ×2 IMPLANT
ELECTRODE REM PT RTRN 9FT ADLT (ELECTROSURGICAL) ×2 IMPLANT
GAUZE PAD ABD 8X10 STRL (GAUZE/BANDAGES/DRESSINGS) ×4 IMPLANT
GAUZE SPONGE 4X4 12PLY STRL (GAUZE/BANDAGES/DRESSINGS) ×2 IMPLANT
GLOVE BIOGEL PI IND STRL 9 (GLOVE) ×2 IMPLANT
GLOVE BIOGEL PI ORTHO SZ9 (GLOVE) ×2 IMPLANT
GOWN STRL REUS W/ TWL XL LVL3 (GOWN DISPOSABLE) ×4 IMPLANT
KIT BASIN OR (CUSTOM PROCEDURE TRAY) ×2 IMPLANT
KIT TURNOVER KIT B (KITS) ×2 IMPLANT
PACK ORTHO EXTREMITY (CUSTOM PROCEDURE TRAY) ×2 IMPLANT
PAD ARMBOARD POSITIONER FOAM (MISCELLANEOUS) ×4 IMPLANT
SOLN 0.9% NACL POUR BTL 1000ML (IV SOLUTION) ×2 IMPLANT
STOCKINETTE IMPERVIOUS LG (DRAPES) IMPLANT
SUT ETHILON 2 0 FS 18 (SUTURE) IMPLANT
SUT ETHILON 2 0 PSLX (SUTURE) ×2 IMPLANT
TOWEL GREEN STERILE (TOWEL DISPOSABLE) ×2 IMPLANT
TUBE CONNECTING 12X1/4 (SUCTIONS) ×2 IMPLANT
YANKAUER SUCT BULB TIP NO VENT (SUCTIONS) ×2 IMPLANT

## 2024-01-19 NOTE — Interval H&P Note (Signed)
 History and Physical Interval Note:  01/19/2024 6:49 AM  [name]  has presented today for surgery, with the diagnosis of Osteomyelitis right foot.  The various methods of treatment have been discussed with the patient and family. After consideration of risks, benefits and other options for treatment, the patient has consented to  Procedure(s) with comments: AMPUTATION, FOOT, RAY (Right) - RIGHT FOOT 1ST RAY AMPUTATION as a surgical intervention.  The patient's history has been reviewed, patient examined, no change in status, stable for surgery.  I have reviewed the patient's chart and labs.  Questions were answered to the patient's satisfaction.     Jakarius Flamenco V Susann Lawhorne

## 2024-01-19 NOTE — Transfer of Care (Signed)
 Immediate Anesthesia Transfer of Care Note  Patient: [name]  Procedure(s) Performed: AMPUTATION, FOOT, RAY (Right)  Patient Location: PACU  Anesthesia Type:MAC and Regional  Level of Consciousness: awake, alert , and oriented  Airway & Oxygen Therapy: Patient Spontanous Breathing  Post-op Assessment: Report given to RN and Post -op Vital signs reviewed and stable  Post vital signs: Reviewed and stable  Last Vitals:  Vitals Value Taken Time  BP 121/54 01/19/24 10:58  Temp    Pulse 77 01/19/24 10:59  Resp 17 01/19/24 10:59  SpO2 96 % 01/19/24 10:59  Vitals shown include unfiled device data.  Last Pain:  Vitals:   01/19/24 0941  TempSrc:   PainSc: 0-No pain         Complications: There were no known notable events for this encounter.

## 2024-01-19 NOTE — Progress Notes (Signed)
 PROGRESS NOTE    [name]  FMW:[id number] DOB: 1950-05-05 DOA: 01/16/2024 PCP: Marvine Rush, MD   Brief Narrative: [name] is a 73 y.o. male with a history of hyperlipidemia, CKD stage IIIa, hypertension, SVT, gout, diabetes mellitus type 2, osteomyelitis, GERD.  Patient presented secondary to emesis and found to have concern for sepsis on presentation secondary to right foot osteomyelitis. Empiric antibiotics started and orthopedic surgery consulted and plan surgical management.   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 Orthopedic surgery consulted and plan a first ray amputation on 12/3 -Continue Vancomycin  and Ceftriaxone  -Follow-up orthopedic surgery recommendations: surgery today -PT/OT consult post-surgery  Sepsis Present on admission. Secondary to osteomyelitis. Patient with continued fevers. Blood cultures obtained on admission are no growth to date.  Diarrhea Patient with a one week history of diarrhea. No associated abdominal pain. No nausea or vomiting. C. Difficile testing is negative. -Follow-up GI pathogen panel  Diabetes mellitus type 2 Poorly controlled with hyperglycemia based on hemoglobin A1C of 9.8%. -Continue insulin  glargine and SSI  Primary hypertension -Continue Toprol  XL  GERD -Continue Protonix   Gout -Continue feboxostat  AKI on CKD stage IIIb Baseline creatinine appears to be around 1.8. Creatinine worsened to 2.42 today. Unclear but likely related to fluid loss from diarrhea. -IV fluids -Check urine sodium and urine creatinine for FENa  Hypokalemia -potassium supplementation  Depression Anxiety -Continue Wellbutrin  XL and sertraline    DVT prophylaxis: Subcutaneous heparin  Code Status:   Code Status: Full Code Family Communication: None at bedside Disposition Plan: Discharge pending ongoing specialist recommendations   Consultants:   Orthopedic surgerg  Procedures:  None  Antimicrobials: Vancomycin  Ceftriaxone     Subjective: Patient reports continued diarrhea. No other concerns today.  Objective: BP (!) 147/62 (BP Location: Left Arm)   Pulse 86   Temp 98.6 F (37 C) (Oral)   Resp 18   Ht 6' 5 (1.956 m)   Wt 103 kg   SpO2 94%   BMI 26.93 kg/m   Examination:  General exam: Appears calm and comfortable. Respiratory system: Clear to auscultation. Respiratory effort normal. Cardiovascular system: S1 & S2 heard, RRR. No murmur. Gastrointestinal system: Abdomen is distended, soft and nontender. Normal bowel sounds heard. Central nervous system: Alert and oriented. No focal neurological deficits. Psychiatry: Judgement and insight appear normal. Mood & affect appropriate.    Data Reviewed: I have personally reviewed following labs and imaging studies  CBC Lab Results  Component Value Date   WBC 12.9 (H) 01/19/2024   RBC 3.63 (L) 01/19/2024   HGB 9.6 (L) 01/19/2024   HCT 29.2 (L) 01/19/2024   MCV 80.4 01/19/2024   MCH 26.4 01/19/2024   PLT 242 01/19/2024   MCHC 32.9 01/19/2024   RDW 14.6 01/19/2024   LYMPHSABS 0.5 (L) 01/18/2024   MONOABS 1.0 01/18/2024   EOSABS 0.1 01/18/2024   BASOSABS 0.0 01/18/2024     Last metabolic panel Lab Results  Component Value Date   NA 136 01/19/2024   K 3.2 (L) 01/19/2024   CL 101 01/19/2024   CO2 23 01/19/2024   BUN 33 (H) 01/19/2024   CREATININE 2.35 (H) 01/19/2024   GLUCOSE 138 (H) 01/19/2024   GFRNONAA 29 (L) 01/19/2024   GFRAA >60 02/13/2019   CALCIUM  7.9 (L) 01/19/2024   PHOS 2.3 (L) 01/16/2024   PROT 8.4 (H) 01/16/2024   ALBUMIN 4.1 01/16/2024   BILITOT 1.1 01/16/2024   ALKPHOS 143 (H)  01/16/2024   AST 12 (L) 01/16/2024   ALT 11 01/16/2024   ANIONGAP 12 01/19/2024    GFR: Estimated Creatinine Clearance: 35.8 mL/min (A) (by C-G formula based on SCr of 2.35 mg/dL (H)).  Recent Results (from the past 240 hours)  Culture, blood (routine  x 2)     Status: None (Preliminary result)   Collection Time: 01/16/24 11:00 AM   Specimen: BLOOD  Result Value Ref Range Status   Specimen Description BLOOD BLOOD RIGHT ARM  Final   Special Requests   Final    BOTTLES DRAWN AEROBIC AND ANAEROBIC Blood Culture adequate volume   Culture   Final    NO GROWTH 3 DAYS Performed at [facility], [address], [place], [place] [address]    Report Status PENDING  Incomplete  Culture, blood (routine x 2)     Status: None (Preliminary result)   Collection Time: 01/16/24 11:00 AM   Specimen: BLOOD  Result Value Ref Range Status   Specimen Description BLOOD BLOOD LEFT ARM  Final   Special Requests   Final    BOTTLES DRAWN AEROBIC AND ANAEROBIC Blood Culture adequate volume   Culture   Final    NO GROWTH 3 DAYS Performed at [facility], [address] [place], [place] [address]    Report Status PENDING  Incomplete  Resp panel by RT-PCR (RSV, Flu A&B, Covid) Anterior Nasal Swab     Status: None   Collection Time: 01/16/24 11:03 AM   Specimen: Anterior Nasal Swab  Result Value Ref Range Status   SARS Coronavirus 2 by RT PCR NEGATIVE NEGATIVE Final    Comment: (NOTE) SARS-CoV-2 target nucleic acids are NOT DETECTED.  The SARS-CoV-2 RNA is generally detectable in upper respiratory specimens during the acute phase of infection. The lowest concentration of SARS-CoV-2 viral copies this assay can detect is 138 copies/mL. A negative result does not preclude SARS-Cov-2 infection and should not be used as the sole basis for treatment or other patient management decisions. A negative result may occur with  improper specimen collection/handling, submission of specimen other than nasopharyngeal swab, presence of viral mutation(s) within the areas targeted by this assay, and inadequate number of viral copies(<138 copies/mL). A negative result must be combined with clinical observations, patient history, and epidemiological information. The expected  result is Negative.  Fact Sheet for Patients:  bloggercourse.com  Fact Sheet for Healthcare Providers:  seriousbroker.it  This test is no t yet approved or cleared by the [place]  FDA and  has been authorized for detection and/or diagnosis of SARS-CoV-2 by FDA under an Emergency Use Authorization (EUA). This EUA will remain  in effect (meaning this test can be used) for the duration of the COVID-19 declaration under Section 564(b)(1) of the Act, 21 U.S.C.section 360bbb-3(b)(1), unless the authorization is terminated  or revoked sooner.       Influenza A by PCR NEGATIVE NEGATIVE Final   Influenza B by PCR NEGATIVE NEGATIVE Final    Comment: (NOTE) The Xpert Xpress SARS-CoV-2/FLU/RSV plus assay is intended as an aid in the diagnosis of influenza from Nasopharyngeal swab specimens and should not be used as a sole basis for treatment. Nasal washings and aspirates are unacceptable for Xpert Xpress SARS-CoV-2/FLU/RSV testing.  Fact Sheet for Patients: bloggercourse.com  Fact Sheet for Healthcare Providers: seriousbroker.it  This test is not yet approved or cleared by the [place]  FDA and has been authorized for detection and/or diagnosis of SARS-CoV-2 by FDA under an Emergency Use Authorization (  EUA). This EUA will remain in effect (meaning this test can be used) for the duration of the COVID-19 declaration under Section 564(b)(1) of the Act, 21 U.S.C. section 360bbb-3(b)(1), unless the authorization is terminated or revoked.     Resp Syncytial Virus by PCR NEGATIVE NEGATIVE Final    Comment: (NOTE) Fact Sheet for Patients: bloggercourse.com  Fact Sheet for Healthcare Providers: seriousbroker.it  This test is not yet approved or cleared by the [place]  FDA and has been authorized for detection and/or diagnosis of  SARS-CoV-2 by FDA under an Emergency Use Authorization (EUA). This EUA will remain in effect (meaning this test can be used) for the duration of the COVID-19 declaration under Section 564(b)(1) of the Act, 21 U.S.C. section 360bbb-3(b)(1), unless the authorization is terminated or revoked.  Performed at [facility], [address], [place], [place] [address]   C Difficile Quick Screen w PCR reflex     Status: None   Collection Time: 01/18/24  6:49 PM   Specimen: STOOL  Result Value Ref Range Status   C Diff antigen NEGATIVE NEGATIVE Final   C Diff toxin NEGATIVE NEGATIVE Final   C Diff interpretation No C. difficile detected.  Final    Comment: Performed at [facility], [address] [place], [place] [address]      Radiology Studies: No results found.     LOS: 3 days    Elgin Lam, MD [facility] 01/19/2024, 8:45 AM   If 7PM-7AM, please contact night-coverage [email or web address]

## 2024-01-19 NOTE — Plan of Care (Signed)

## 2024-01-19 NOTE — Op Note (Signed)
 01/19/2024  11:16 AM  PATIENT:  [name]    PRE-OPERATIVE DIAGNOSIS:  Osteomyelitis right foot  POST-OPERATIVE DIAGNOSIS:  Same  PROCEDURE:  AMPUTATION, FOOT, RAY right foot first ray. Local tissue transfer for wound closure 4 x 10 cm.  SURGEON:  Jerona LULLA Sage, MD  PHYSICIAN ASSISTANT:None ANESTHESIA:   General  PREOPERATIVE INDICATIONS:  [name] is a  73 y.o. male with a diagnosis of Osteomyelitis right foot who failed conservative measures and elected for surgical management.    The risks benefits and alternatives were discussed with the patient preoperatively including but not limited to the risks of infection, bleeding, nerve injury, cardiopulmonary complications, the need for revision surgery, among others, and the patient was willing to proceed.  OPERATIVE IMPLANTS:   * No implants in log *  @ENCIMAGES @  OPERATIVE FINDINGS: Tissue margins were clear.  OPERATIVE PROCEDURE: Patient was brought the operating room and underwent a regional anesthetic.  After adequate levels anesthesia were obtained patient's right lower extremity was prepped using DuraPrep draped into a sterile field a timeout was called.  An elliptical incision was made around the ulcerative tissue and the first metatarsal was resected through the base of the first metatarsal.  The ulcerative tissue and metatarsal were resected in 1 block of tissue.  The tissue margins were clear electrocautery was used for hemostasis.  The wound was irrigated with Vashe.  The tissue margins were undermined to allow for local tissue transfer.  Local tissue transfer was used to close the wound 10 x 4 cm with 2-0 nylon.  A sterile dressing was applied patient was taken to the PACU in stable condition.   DISCHARGE PLANNING:  Antibiotic duration: Continue antibiotics for 24 hours  Weightbearing: Touchdown weightbearing on the right  Pain medication: Opioid pathway  Dressing care/ Wound VAC: Dry dressing  reinforce as needed  Ambulatory devices: Walker  Discharge to: Discharge planning based on therapy recommendations.  Follow-up: In the office 1 week post operative.

## 2024-01-19 NOTE — Plan of Care (Signed)
  Problem: Skin Integrity: Goal: Risk for impaired skin integrity will decrease Outcome: Progressing   Problem: Tissue Perfusion: Goal: Adequacy of tissue perfusion will improve Outcome: Progressing   Problem: Education: Goal: Knowledge of General Education information will improve Description: Including pain rating scale, medication(s)/side effects and non-pharmacologic comfort measures Outcome: Progressing   Problem: Health Behavior/Discharge Planning: Goal: Ability to manage health-related needs will improve Outcome: Progressing   

## 2024-01-19 NOTE — Progress Notes (Signed)
 Unable to get report from the floor nurse. Nobody available.

## 2024-01-19 NOTE — Progress Notes (Signed)
 Orthopedic Tech Progress Note Patient Details:  VEARL ALLBAUGH [birth year] [id number]  Ortho Devices Type of Ortho Device: Postop shoe/boot Ortho Device/Splint Location: RLE/ fitted and removed. Left at bedside Ortho Device/Splint Interventions: Ordered, Application, Adjustment   Post Interventions Patient Tolerated: Well Instructions Provided: Care of device, Adjustment of device  Adine MARLA Blush 01/19/2024, 2:40 PM

## 2024-01-20 DIAGNOSIS — A419 Sepsis, unspecified organism: Secondary | ICD-10-CM | POA: Diagnosis not present

## 2024-01-20 LAB — BASIC METABOLIC PANEL WITH GFR
Anion gap: 9 (ref 5–15)
BUN: 35 mg/dL — ABNORMAL HIGH (ref 8–23)
CO2: 22 mmol/L (ref 22–32)
Calcium: 7.4 mg/dL — ABNORMAL LOW (ref 8.9–10.3)
Chloride: 103 mmol/L (ref 98–111)
Creatinine, Ser: 2.28 mg/dL — ABNORMAL HIGH (ref 0.61–1.24)
GFR, Estimated: 30 mL/min — ABNORMAL LOW (ref >60)
Glucose, Bld: 175 mg/dL — ABNORMAL HIGH (ref 70–99)
Potassium: 3.2 mmol/L — ABNORMAL LOW (ref 3.5–5.1)
Sodium: 134 mmol/L — ABNORMAL LOW (ref 135–145)

## 2024-01-20 LAB — CBC
HCT: 28.4 % — ABNORMAL LOW (ref 39.0–52.0)
Hemoglobin: 9.6 g/dL — ABNORMAL LOW (ref 13.0–17.0)
MCH: 27 pg (ref 26.0–34.0)
MCHC: 33.8 g/dL (ref 30.0–36.0)
MCV: 79.8 fL — ABNORMAL LOW (ref 80.0–100.0)
Platelets: 250 K/uL (ref 150–400)
RBC: 3.56 MIL/uL — ABNORMAL LOW (ref 4.22–5.81)
RDW: 14.6 % (ref 11.5–15.5)
WBC: 13.2 K/uL — ABNORMAL HIGH (ref 4.0–10.5)
nRBC: 0 % (ref 0.0–0.2)

## 2024-01-20 LAB — GLUCOSE, CAPILLARY
Glucose-Capillary: 172 mg/dL — ABNORMAL HIGH (ref 70–99)
Glucose-Capillary: 177 mg/dL — ABNORMAL HIGH (ref 70–99)
Glucose-Capillary: 246 mg/dL — ABNORMAL HIGH (ref 70–99)
Glucose-Capillary: 232 mg/dL — ABNORMAL HIGH (ref 70–99)
Glucose-Capillary: 169 mg/dL — ABNORMAL HIGH (ref 70–99)

## 2024-01-20 LAB — MAGNESIUM: Magnesium: 1.8 mg/dL (ref 1.7–2.4)

## 2024-01-20 MED ORDER — CEFADROXIL 500 MG PO CAPS: 500.0000 mg | ORAL_CAPSULE | Freq: Two times a day (BID) | ORAL | Status: DC

## 2024-01-20 MED ORDER — DOXYCYCLINE HYCLATE 100 MG PO TABS: 100.0000 mg | ORAL_TABLET | Freq: Two times a day (BID) | ORAL | Status: DC

## 2024-01-20 MED ORDER — POTASSIUM CHLORIDE CRYS ER 20 MEQ PO TBCR
40.0000 meq | EXTENDED_RELEASE_TABLET | Freq: Once | ORAL | Status: AC
  Administered 2024-01-20: 40 meq via ORAL
  Filled 2024-01-20: qty 2

## 2024-01-20 MED ORDER — SODIUM CHLORIDE 0.9 % IV SOLN: INTRAVENOUS | Status: AC

## 2024-01-20 NOTE — Evaluation (Signed)
 Physical Therapy Evaluation Patient Details Name: [name] MRN: [id number] DOB: 1950/07/04 Today's Date: 01/20/2024  History of Present Illness  73 y.o. male admitted 01/16/24 for sepsis secondary to right foot osteomyelitis. Pt s/p R first ray amputation 12/5. PMHx: T2DM, nephropathy, GERD, HTN, HLD, CKD stage IIIa, and SVT.  Clinical Impression  Pt admitted with above diagnosis. PTA, pt was modI for functional mobility using RW and independent with IADLs. He lives alone in a one story house with 1 STE. Pt currently with functional limitations due to the deficits listed below (see PT Problem List). He required minA for bed mobility, modA for sit<>stand using RW, and minA for bed>chair transfer using RW. Pt is currently limited by pain, weight bearing status (RLE TDWB), impaired balance, and decreased activity tolerance. Pt is motivated to return home at d/c; however, based on CLOF, home set-up, and limited family support recommend follow up skilled PT services <3 hours/day to maximize functional independence, safety, and return to PLOF. Pt has the potential to progress to a level where return home is appropriate and safe. Pt will benefit from acute skilled PT to increase their independence and safety with mobility to allow discharge.     If plan is discharge home, recommend the following: A lot of help with walking and/or transfers;A lot of help with bathing/dressing/bathroom;Assistance with cooking/housework;Assist for transportation;Help with stairs or ramp for entrance   Can travel by private vehicle   Yes    Equipment Recommendations Wheelchair (measurements PT);Wheelchair cushion (measurements PT);BSC/3in1  Recommendations for Other Services       Functional Status Assessment Patient has had a recent decline in their functional status and demonstrates the ability to make significant improvements in function in a reasonable and predictable amount of time.     Precautions /  Restrictions Precautions Precautions: Fall Recall of Precautions/Restrictions: Impaired Required Braces or Orthoses: Other Brace Other Brace: Post-Op Shoe RLE Restrictions Weight Bearing Restrictions Per Provider Order: Yes RLE Weight Bearing Per Provider Order: Touchdown weight bearing Other Position/Activity Restrictions: Per Dr. Harden Orthopedic note 12/6 minimize weightbearing to the right lower extremity      Mobility  Bed Mobility Overal bed mobility: Needs Assistance Bed Mobility: Supine to Sit     Supine to sit: Min assist     General bed mobility comments: Pt sat up on R side of bed with increased time. He brought BLE towards EOB. Assist to elevate trunk and scoot hips fwd.    Transfers Overall transfer level: Needs assistance Equipment used: Rolling walker (2 wheels) Transfers: Sit to/from Stand, Bed to chair/wheelchair/BSC Sit to Stand: From elevated surface, Mod assist   Step pivot transfers: Min assist       General transfer comment: Pt stood from raised bed height. Cued proper hand placement using RW. Powered up with minA. Maintained static stance for pericare to be addressed. Pt maintained minimal weight bearing on RLE only allowing the post-op shoe to briefly rest on the floor without weight acceptance. Transferred to recliner chair on left. Poor eccentric control, plopping down.    Ambulation/Gait Ambulation/Gait assistance: Min assist Gait Distance (Feet): 3 Feet Assistive device: Rolling walker (2 wheels) Gait Pattern/deviations: Step-to pattern Gait velocity: decr     General Gait Details: Pt transferred to recliner chair using a hopping technique primarily keeping RLE NWB but intermittently touching down for balance. Heavy reliance on BUE support on RW. Unsteady.  Stairs            Psychologist, [employer] [facility]  Tilt Bed    Modified Rankin (Stroke Patients Only)       Balance Overall balance assessment: Needs assistance Sitting-balance  support: Bilateral upper extremity supported, Feet supported Sitting balance-Leahy Scale: Fair Sitting balance - Comments: Pt sat EOB with close supervision statically. He demonstrated posterior lean during strength testing. Postural control: Posterior lean Standing balance support: Bilateral upper extremity supported, During functional activity, Reliant on assistive device for balance Standing balance-Leahy Scale: Poor Standing balance comment: Pt dependent on RW                             Pertinent Vitals/Pain Pain Assessment Pain Assessment: Faces Faces Pain Scale: Hurts even more Pain Location: R Foot Pain Descriptors / Indicators: Discomfort, Grimacing Pain Intervention(s): Monitored during session, Limited activity within patient's tolerance, Repositioned    Home Living Family/patient expects to be discharged to:: Private residence Living Arrangements: Alone Available Help at Discharge: Family;Available PRN/intermittently (Niece can check in on him after work and more consistently on the weekends.) Type of Home: House Home Access: Stairs to enter Entrance Stairs-Rails: None Entrance Stairs-Number of Steps: 1   Home Layout: One level Home Equipment: Agricultural Consultant (2 wheels);Shower seat      Prior Function Prior Level of Function : Independent/Modified Independent             Mobility Comments: Ambulates using RW. Reports several near falls d/t LOB. ADLs Comments: Indep with ADLs. Stands to shower. Cooks on stove. Niece aids with IADLs including taking him to appointments, manages meds, and pays the bills for him.     Extremity/Trunk Assessment   Upper Extremity Assessment Upper Extremity Assessment: Defer to OT evaluation    Lower Extremity Assessment Lower Extremity Assessment: RLE deficits/detail RLE Deficits / Details: Pt POD 1 s/p first ray amputation. Pt in bandage around foot. Hip and Knee AROM and well as strength WFL. Decreased ankle  AROM/strength. RLE: Unable to fully assess due to pain RLE Sensation: decreased light touch;decreased proprioception RLE Coordination: decreased gross motor    Cervical / Trunk Assessment Cervical / Trunk Assessment: Normal  Communication   Communication Communication: Impaired Factors Affecting Communication: Reduced clarity of speech    Cognition Arousal: Alert Behavior During Therapy: WFL for tasks assessed/performed   PT - Cognitive impairments: No family/caregiver present to determine baseline                       PT - Cognition Comments: Pt A,Ox4 Following commands: Intact       Cueing Cueing Techniques: Verbal cues, Gestural cues     General Comments General comments (skin integrity, edema, etc.): Pt greeted soiled in bed from BM. Performed pericare in standing. Pt verbalized he hadn't called out for assistance yet as it had just happended. Removed dirty linen and changed gown.    Exercises     Assessment/Plan    PT Assessment Patient needs continued PT services  PT Problem List Decreased strength;Decreased activity tolerance;Decreased balance;Decreased mobility;Decreased knowledge of use of DME;Decreased safety awareness       PT Treatment Interventions DME instruction;Gait training;Functional mobility training;Stair training;Therapeutic activities;Therapeutic exercise;Balance training;Patient/family education;Wheelchair mobility training    PT Goals (Current goals can be found in the Care Plan section)  Acute Rehab PT Goals Patient Stated Goal: Return Home PT Goal Formulation: With patient Time For Goal Achievement: 02/03/24 Potential to Achieve Goals: Good Additional Goals Additional Goal #1: Pt will propel manual w/c ~4ft  with supervision.    Frequency Min 2X/week     Co-evaluation               AM-PAC PT 6 Clicks Mobility  Outcome Measure Help needed turning from your back to your side while in a flat bed without using  bedrails?: A Little Help needed moving from lying on your back to sitting on the side of a flat bed without using bedrails?: A Little Help needed moving to and from a bed to a chair (including a wheelchair)?: A Little Help needed standing up from a chair using your arms (e.g., wheelchair or bedside chair)?: A Lot Help needed to walk in hospital room?: A Lot Help needed climbing 3-5 steps with a railing? : A Lot 6 Click Score: 15    End of Session Equipment Utilized During Treatment: Gait belt Activity Tolerance: Patient tolerated treatment well Patient left: in chair;with call bell/phone within reach;with chair alarm set Nurse Communication: Mobility status PT Visit Diagnosis: Difficulty in walking, not elsewhere classified (R26.2);Muscle weakness (generalized) (M62.81);Other abnormalities of gait and mobility (R26.89);Unsteadiness on feet (R26.81)    Time: 8663-8641 PT Time Calculation (min) (ACUTE ONLY): 22 min   Charges:   PT Evaluation $PT Eval Moderate Complexity: 1 Mod   PT General Charges $$ ACUTE PT VISIT: 1 Visit         Randall SAUNDERS, PT, DPT Acute Rehabilitation Services Office: [phone] Secure Chat Preferred  Delon CHRISTELLA Callander 01/20/2024, 4:56 PM

## 2024-01-20 NOTE — Progress Notes (Signed)
 Patient ID: [name], male   DOB: May 24, 1950, 73 y.o.   MRN: [id number] Patient is postop day 1 right foot first metatarsal resection.  Dressing is clean and dry.  Plan for physical therapy minimize weightbearing to the right lower extremity.  Anticipate discharge when patient is safe with therapy.  I will follow-up in the office in 1 week.

## 2024-01-20 NOTE — Plan of Care (Signed)

## 2024-01-20 NOTE — Progress Notes (Signed)
 PROGRESS NOTE    [name]  FMW:[id number] DOB: [birth year] DOA: 01/16/2024 PCP: Marvine Rush, MD   Brief Narrative: [name] is a 73 y.o. male with a history of hyperlipidemia, CKD stage IIIa, hypertension, SVT, gout, diabetes mellitus type 2, osteomyelitis, GERD.  Patient presented secondary to emesis and found to have concern for sepsis on presentation secondary to right foot osteomyelitis. Empiric antibiotics started and orthopedic surgery consulted and plan surgical management.   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 Orthopedic surgery consulted and performed a first ray amputation on 12/3. Per orthopedic surgery, patient is weightbearing to the right extremity and recommend office follow-up in 1 week.  -Discontinue IV antibiotics -Follow-up orthopedic surgery recommendations: surgery today -PT/OT  Sepsis Present on admission. Secondary to osteomyelitis. Patient with continued fevers. Blood cultures obtained on admission are no growth to date.  Diarrhea Patient with a one week history of diarrhea. No associated abdominal pain. No nausea or vomiting. C. Difficile and GI pathogen panel testing is negative.   Diabetes mellitus type 2 Poorly controlled with hyperglycemia based on hemoglobin A1C of 9.8%. -Continue insulin  glargine and SSI  Primary hypertension -Continue Toprol  XL  GERD -Continue Protonix   Gout -Continue feboxostat  AKI on CKD stage IIIb Baseline creatinine appears to be around 1.8. Creatinine worsened to 2.42. Unclear but likely related to fluid loss from diarrhea. Improved to 2.28 today. -IV fluids -Check urine sodium and urine creatinine for FENa  Hypokalemia -potassium supplementation -Check magnesium  Depression Anxiety -Continue Wellbutrin  XL and sertraline    DVT prophylaxis: Subcutaneous heparin  Code Status:   Code Status: Full  Code Family Communication: None at bedside Disposition Plan: Discharge pending ongoing specialist recommendations   Consultants:  Orthopedic surgerg  Procedures:  None  Antimicrobials: Vancomycin  Ceftriaxone     Subjective: Patient reports continued diarrhea. No other concerns today.  Objective: BP (!) 147/59 (BP Location: Left Arm)   Pulse 88   Temp 98.9 F (37.2 C)   Resp 18   Ht (P) 6' 5 (1.956 m)   Wt (P) 103 kg   SpO2 94%   BMI (P) 26.93 kg/m   Examination:  General exam: Appears calm and comfortable. Respiratory system: Clear to auscultation. Respiratory effort normal. Cardiovascular system: S1 & S2 heard, RRR. No murmur. Gastrointestinal system: Abdomen is distended, soft and nontender. Normal bowel sounds heard. Central nervous system: Alert and oriented. No focal neurological deficits. Psychiatry: Judgement and insight appear normal. Mood & affect appropriate.    Data Reviewed: I have personally reviewed following labs and imaging studies  CBC Lab Results  Component Value Date   WBC 13.2 (H) 01/20/2024   RBC 3.56 (L) 01/20/2024   HGB 9.6 (L) 01/20/2024   HCT 28.4 (L) 01/20/2024   MCV 79.8 (L) 01/20/2024   MCH 27.0 01/20/2024   PLT 250 01/20/2024   MCHC 33.8 01/20/2024   RDW 14.6 01/20/2024   LYMPHSABS 0.5 (L) 01/18/2024   MONOABS 1.0 01/18/2024   EOSABS 0.1 01/18/2024   BASOSABS 0.0 01/18/2024     Last metabolic panel Lab Results  Component Value Date   NA 134 (L) 01/20/2024   K 3.2 (L) 01/20/2024   CL 103 01/20/2024   CO2 22 01/20/2024   BUN 35 (H) 01/20/2024   CREATININE 2.28 (H) 01/20/2024   GLUCOSE 175 (H) 01/20/2024   GFRNONAA 30 (L) 01/20/2024   GFRAA >60 02/13/2019   CALCIUM  7.4 (L) 01/20/2024  PHOS 2.3 (L) 01/16/2024   PROT 8.4 (H) 01/16/2024   ALBUMIN 4.1 01/16/2024   BILITOT 1.1 01/16/2024   ALKPHOS 143 (H) 01/16/2024   AST 12 (L) 01/16/2024   ALT 11 01/16/2024   ANIONGAP 9 01/20/2024    GFR: Estimated  Creatinine Clearance: 36.9 mL/min (A) (by C-G formula based on SCr of 2.28 mg/dL (H)).  Recent Results (from the past 240 hours)  Culture, blood (routine x 2)     Status: None (Preliminary result)   Collection Time: 01/16/24 11:00 AM   Specimen: BLOOD  Result Value Ref Range Status   Specimen Description BLOOD BLOOD RIGHT ARM  Final   Special Requests   Final    BOTTLES DRAWN AEROBIC AND ANAEROBIC Blood Culture adequate volume   Culture   Final    NO GROWTH 4 DAYS Performed at [facility], [address], [place], [place] [address]    Report Status PENDING  Incomplete  Culture, blood (routine x 2)     Status: None (Preliminary result)   Collection Time: 01/16/24 11:00 AM   Specimen: BLOOD  Result Value Ref Range Status   Specimen Description BLOOD BLOOD LEFT ARM  Final   Special Requests   Final    BOTTLES DRAWN AEROBIC AND ANAEROBIC Blood Culture adequate volume   Culture   Final    NO GROWTH 4 DAYS Performed at [facility], [address], [place], [place] [address]    Report Status PENDING  Incomplete  Resp panel by RT-PCR (RSV, Flu A&B, Covid) Anterior Nasal Swab     Status: None   Collection Time: 01/16/24 11:03 AM   Specimen: Anterior Nasal Swab  Result Value Ref Range Status   SARS Coronavirus 2 by RT PCR NEGATIVE NEGATIVE Final    Comment: (NOTE) SARS-CoV-2 target nucleic acids are NOT DETECTED.  The SARS-CoV-2 RNA is generally detectable in upper respiratory specimens during the acute phase of infection. The lowest concentration of SARS-CoV-2 viral copies this assay can detect is 138 copies/mL. A negative result does not preclude SARS-Cov-2 infection and should not be used as the sole basis for treatment or other patient management decisions. A negative result may occur with  improper specimen collection/handling, submission of specimen other than nasopharyngeal swab, presence of viral mutation(s) within the areas targeted by this assay, and inadequate number of  viral copies(<138 copies/mL). A negative result must be combined with clinical observations, patient history, and epidemiological information. The expected result is Negative.  Fact Sheet for Patients:  bloggercourse.com  Fact Sheet for Healthcare Providers:  seriousbroker.it  This test is no t yet approved or cleared by the [place]  FDA and  has been authorized for detection and/or diagnosis of SARS-CoV-2 by FDA under an Emergency Use Authorization (EUA). This EUA will remain  in effect (meaning this test can be used) for the duration of the COVID-19 declaration under Section 564(b)(1) of the Act, 21 U.S.C.section 360bbb-3(b)(1), unless the authorization is terminated  or revoked sooner.       Influenza A by PCR NEGATIVE NEGATIVE Final   Influenza B by PCR NEGATIVE NEGATIVE Final    Comment: (NOTE) The Xpert Xpress SARS-CoV-2/FLU/RSV plus assay is intended as an aid in the diagnosis of influenza from Nasopharyngeal swab specimens and should not be used as a sole basis for treatment. Nasal washings and aspirates are unacceptable for Xpert Xpress SARS-CoV-2/FLU/RSV testing.  Fact Sheet for Patients: bloggercourse.com  Fact Sheet for Healthcare Providers: seriousbroker.it  This test is not yet  approved or cleared by the [place]  FDA and has been authorized for detection and/or diagnosis of SARS-CoV-2 by FDA under an Emergency Use Authorization (EUA). This EUA will remain in effect (meaning this test can be used) for the duration of the COVID-19 declaration under Section 564(b)(1) of the Act, 21 U.S.C. section 360bbb-3(b)(1), unless the authorization is terminated or revoked.     Resp Syncytial Virus by PCR NEGATIVE NEGATIVE Final    Comment: (NOTE) Fact Sheet for Patients: bloggercourse.com  Fact Sheet for Healthcare  Providers: seriousbroker.it  This test is not yet approved or cleared by the [place]  FDA and has been authorized for detection and/or diagnosis of SARS-CoV-2 by FDA under an Emergency Use Authorization (EUA). This EUA will remain in effect (meaning this test can be used) for the duration of the COVID-19 declaration under Section 564(b)(1) of the Act, 21 U.S.C. section 360bbb-3(b)(1), unless the authorization is terminated or revoked.  Performed at [facility], [address] [place], [place] [address]   Gastrointestinal Panel by PCR , Stool     Status: None   Collection Time: 01/18/24  6:49 PM   Specimen: Stool  Result Value Ref Range Status   Campylobacter species NOT DETECTED NOT DETECTED Final   Plesimonas shigelloides NOT DETECTED NOT DETECTED Final   Salmonella species NOT DETECTED NOT DETECTED Final   Yersinia enterocolitica NOT DETECTED NOT DETECTED Final   Vibrio species NOT DETECTED NOT DETECTED Final   Vibrio cholerae NOT DETECTED NOT DETECTED Final   Enteroaggregative E coli (EAEC) NOT DETECTED NOT DETECTED Final   Enteropathogenic E coli (EPEC) NOT DETECTED NOT DETECTED Final   Enterotoxigenic E coli (ETEC) NOT DETECTED NOT DETECTED Final   Shiga like toxin producing E coli (STEC) NOT DETECTED NOT DETECTED Final   Shigella/Enteroinvasive E coli (EIEC) NOT DETECTED NOT DETECTED Final   Cryptosporidium NOT DETECTED NOT DETECTED Final   Cyclospora cayetanensis NOT DETECTED NOT DETECTED Final   Entamoeba histolytica NOT DETECTED NOT DETECTED Final   Giardia lamblia NOT DETECTED NOT DETECTED Final   Adenovirus F40/41 NOT DETECTED NOT DETECTED Final   Astrovirus NOT DETECTED NOT DETECTED Final   Norovirus GI/GII NOT DETECTED NOT DETECTED Final   Rotavirus A NOT DETECTED NOT DETECTED Final   Sapovirus (I, II, IV, and V) NOT DETECTED NOT DETECTED Final    Comment: Performed at [facility], [address] [place], [place] [address]   C Difficile Quick Screen w PCR reflex     Status: None   Collection Time: 01/18/24  6:49 PM   Specimen: STOOL  Result Value Ref Range Status   C Diff antigen NEGATIVE NEGATIVE Final   C Diff toxin NEGATIVE NEGATIVE Final   C Diff interpretation No C. difficile detected.  Final    Comment: Performed at [facility], [address] [place], [place] [address]      Radiology Studies: No results found.     LOS: 4 days    Elgin Lam, MD [facility] 01/20/2024, 9:10 AM   If 7PM-7AM, please contact night-coverage [email or web address]

## 2024-01-21 LAB — CULTURE, BLOOD (ROUTINE X 2)
Culture: NO GROWTH
Culture: NO GROWTH
Special Requests: ADEQUATE
Special Requests: ADEQUATE

## 2024-01-21 LAB — BASIC METABOLIC PANEL WITH GFR
Anion gap: 7 (ref 5–15)
BUN: 33 mg/dL — ABNORMAL HIGH (ref 8–23)
CO2: 24 mmol/L (ref 22–32)
Calcium: 7.9 mg/dL — ABNORMAL LOW (ref 8.9–10.3)
Chloride: 105 mmol/L (ref 98–111)
Creatinine, Ser: 2.01 mg/dL — ABNORMAL HIGH (ref 0.61–1.24)
GFR, Estimated: 35 mL/min — ABNORMAL LOW (ref >60)
Glucose, Bld: 137 mg/dL — ABNORMAL HIGH (ref 70–99)
Potassium: 3.5 mmol/L (ref 3.5–5.1)
Sodium: 136 mmol/L (ref 135–145)

## 2024-01-21 LAB — CBC
HCT: 29.1 % — ABNORMAL LOW (ref 39.0–52.0)
Hemoglobin: 9.7 g/dL — ABNORMAL LOW (ref 13.0–17.0)
MCH: 26.6 pg (ref 26.0–34.0)
MCHC: 33.3 g/dL (ref 30.0–36.0)
MCV: 79.7 fL — ABNORMAL LOW (ref 80.0–100.0)
Platelets: 266 K/uL (ref 150–400)
RBC: 3.65 MIL/uL — ABNORMAL LOW (ref 4.22–5.81)
RDW: 14.6 % (ref 11.5–15.5)
WBC: 11.8 K/uL — ABNORMAL HIGH (ref 4.0–10.5)
nRBC: 0 % (ref 0.0–0.2)

## 2024-01-21 LAB — GLUCOSE, CAPILLARY
Glucose-Capillary: 146 mg/dL — ABNORMAL HIGH (ref 70–99)
Glucose-Capillary: 130 mg/dL — ABNORMAL HIGH (ref 70–99)
Glucose-Capillary: 202 mg/dL — ABNORMAL HIGH (ref 70–99)
Glucose-Capillary: 173 mg/dL — ABNORMAL HIGH (ref 70–99)
Glucose-Capillary: 125 mg/dL — ABNORMAL HIGH (ref 70–99)

## 2024-01-21 MED ORDER — HYDRALAZINE HCL 25 MG PO TABS
25.0000 mg | ORAL_TABLET | Freq: Two times a day (BID) | ORAL | Status: DC
  Administered 2024-01-21 – 2024-01-24 (×7): 25 mg via ORAL
  Filled 2024-01-21 (×7): qty 1

## 2024-01-21 NOTE — Plan of Care (Signed)

## 2024-01-21 NOTE — Social Work (Signed)
 [name] May 15, 1950  Please be advised that the above-named patient will require a short-term nursing home stay - anticipated 30 days or less for rehabilitation and strengthening.  The plan is for return home.  Cena Ligas, LCSW Clinical Social Worker

## 2024-01-21 NOTE — [place] FL2 (Signed)
 [place]  MEDICAID [id number] LEVEL OF CARE FORM     IDENTIFICATION  Patient Name: [name] Birthdate: [birth year] Sex: male Admission Date (Current Location): 01/16/2024  [facility] and [place] Number:  Producer, Television/film/video and Address:  [facility] [place], [address] [place], [place] [address]      Provider Number: [id number]  Attending Physician Name and Address:  Briana Elgin LABOR, MD  Relative Name and Phone Number:       Current Level of Care: Hospital Recommended Level of Care: Skilled Nursing Facility Prior Approval Number:    Date Approved/Denied:   PASRR Number: pending  Discharge Plan: SNF    Current Diagnoses: Patient Active Problem List   Diagnosis Date Noted   Depression with anxiety 01/16/2024   Acute ischemic stroke (HCC) 05/03/2023   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/03/2023   Chronic osteomyelitis (HCC) 05/03/2023   Hyperglycemia 03/04/2023   Osteomyelitis of foot, right, acute (HCC) 03/01/2023   Lactic acidosis 03/01/2023   History of transmetatarsal amputation of right foot (HCC) 04/06/2022   Cutaneous abscess of right foot 02/04/2022   Osteomyelitis of great toe of left foot (HCC)    Hyperlipidemia 11/30/2021   Bedbug bite with infection 11/30/2021   AKI (acute kidney injury) 11/12/2021   Sepsis (HCC) 11/11/2021   Amputated great toe of right foot 02/20/2019   Stage 3a chronic kidney disease (HCC) 02/13/2019   Acute osteomyelitis (HCC)    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/30/2019   Fatty liver 01/09/2019   Thrombocytopenia, unspecified 05/14/2013   Other specified abnormal findings of blood chemistry 05/14/2013   Colon adenoma 09/13/2012   Anxiety 09/06/2012   Intellectual disability 09/06/2012   Chest pain 09/06/2012   GERD (gastroesophageal reflux disease) 03/08/2012   Gout 09/06/2011   Hypertension 03/08/2011   Chronic kidney disease, stage III (moderate) (HCC) 11/30/2009   Urinary tract infection 11/30/2009     Orientation RESPIRATION BLADDER Height & Weight     Self, Time, Situation, Place  Normal Continent Weight: (P) 227 lb 1.2 oz (103 kg) Height:  (P) 6' 5 (195.6 cm)  BEHAVIORAL SYMPTOMS/MOOD NEUROLOGICAL BOWEL NUTRITION STATUS      Continent Diet (See DC summary)  AMBULATORY STATUS COMMUNICATION OF NEEDS Skin   Extensive Assist Verbally Other (Comment) (diabetic ulcer right foot,)                       Personal Care Assistance Level of Assistance  Bathing, Feeding, Dressing Bathing Assistance: Maximum assistance Feeding assistance: Limited assistance Dressing Assistance: Maximum assistance     Functional Limitations Info  Sight, Hearing, Speech Sight Info: Adequate Hearing Info: Adequate Speech Info: Adequate    SPECIAL CARE FACTORS FREQUENCY  PT (By licensed PT), OT (By licensed OT)     PT Frequency: 5x a week OT Frequency: 5x a week            Contractures Contractures Info: Not present    Additional Factors Info  Code Status, Allergies Code Status Info: Full Allergies Info: Ivp Dye (Iodinated Contrast Media)  Penicillins  Tetanus Toxoid-containing Vaccines           Current Medications (01/21/2024):  This is the current hospital active medication list Current Facility-Administered Medications  Medication Dose Route Frequency Provider Last Rate Last Admin   0.9 %  sodium chloride  infusion   Intravenous Continuous Briana Elgin LABOR, MD 40 mL/hr at 01/21/24 0341 New Bag at 01/21/24 0341   atorvastatin  (LIPITOR)  tablet 10 mg  10 mg Oral QHS Gerome Herring M, PA-C   10 mg at 01/20/24 2140   buPROPion  (WELLBUTRIN  XL) 24 hr tablet 150 mg  150 mg Oral QHS Gerome Herring M, PA-C   150 mg at 01/20/24 2141   febuxostat  (ULORIC ) tablet 80 mg  80 mg Oral Daily Gerome Herring M, PA-C   80 mg at 01/21/24 1042   feeding supplement (ENSURE ENLIVE / ENSURE PLUS) liquid 237 mL  237 mL Oral BID BM Gerome Herring M, PA-C   237 mL at 01/21/24 1042   fentaNYL  (SUBLIMAZE )  injection 50 mcg  50 mcg Intravenous Once Gerome Herring M, PA-C       heparin  injection 5,000 Units  5,000 Units Subcutaneous Q8H Gerome Herring HERO, PA-C   5,000 Units at 01/21/24 9388   hydrALAZINE  (APRESOLINE ) injection 10 mg  10 mg Intravenous Q4H PRN Gerome Herring HERO, PA-C   10 mg at 01/20/24 2045   hydrALAZINE  (APRESOLINE ) tablet 25 mg  25 mg Oral BID Briana Elgin LABOR, MD       HYDROmorphone  (DILAUDID ) injection 0.5 mg  0.5 mg Intravenous Q4H PRN Gerome Herring HERO, PA-C       insulin  aspart (novoLOG ) injection 0-15 Units  0-15 Units Subcutaneous TID WC Gerome Herring HERO, PA-C   2 Units at 01/21/24 9373   insulin  aspart (novoLOG ) injection 0-5 Units  0-5 Units Subcutaneous QHS Gerome Herring HERO, PA-C   2 Units at 01/16/24 2203   insulin  aspart (novoLOG ) injection 5 Units  5 Units Subcutaneous TID WC Gerome Herring HERO, PA-C   5 Units at 01/21/24 1042   insulin  glargine-yfgn (SEMGLEE ) injection 15 Units  15 Units Subcutaneous QHS Gerome Herring HERO, PA-C   15 Units at 01/20/24 2141   metoprolol  succinate (TOPROL -XL) 24 hr tablet 100 mg  100 mg Oral Daily Gerome Herring M, PA-C   100 mg at 01/21/24 1042   ondansetron  (ZOFRAN ) tablet 4 mg  4 mg Oral Q6H PRN Gerome Herring HERO, PA-C       Or   ondansetron  (ZOFRAN ) injection 4 mg  4 mg Intravenous Q6H PRN Gerome Herring M, PA-C   4 mg at 01/17/24 1421   oxyCODONE  (Oxy IR/ROXICODONE ) immediate release tablet 5 mg  5 mg Oral Q6H PRN Gerome Herring HERO, PA-C   5 mg at 01/21/24 0615   pantoprazole  (PROTONIX ) EC tablet 40 mg  40 mg Oral Daily Gerome Herring M, PA-C   40 mg at 01/21/24 1042   prochlorperazine  (COMPAZINE ) injection 10 mg  10 mg Intravenous Q6H PRN Gerome Herring M, PA-C   10 mg at 01/17/24 1622   senna-docusate (Senokot-S) tablet 2 tablet  2 tablet Oral QHS PRN Gerome Herring HERO, PA-C       sertraline  (ZOLOFT ) tablet 100 mg  100 mg Oral QHS Gerome Herring M, PA-C   100 mg at 01/20/24 2140   sodium bicarbonate  tablet 650 mg  650 mg Oral BID Gerome Herring HERO, PA-C   650  mg at 01/21/24 1042     Discharge Medications: Please see discharge summary for a list of discharge medications.  Relevant Imaging Results:  Relevant Lab Results:   Additional Information SSN: [id number]  Cena Ligas, [place]

## 2024-01-21 NOTE — Progress Notes (Signed)
 OT Cancellation Note  Patient Details Name: [name] MRN: [id number] DOB: 06/17/1950   Cancelled Treatment:    Reason Eval/Treat Not Completed: Other (comment) Pt declined evaluation stating he was not doing anything today. OT to follow-up as appropriate and schedule allows.   Maurilio CROME, OTR/LSABRA  [facility]  Office: [phone]    Maurilio PARAS Shalie Schremp 01/21/2024, 3:26 PM

## 2024-01-21 NOTE — TOC Initial Note (Addendum)
 Transition of Care [facility]) - Initial/Assessment Note    Patient Details  Name: [name] MRN: [id number] Date of Birth: 30-Jun-1950  [facility]) CM/SW Contact:    Cena Ligas, LCSW Phone Number: 01/21/2024, 11:47 AM  Clinical Narrative:                  CSW received SNF consult. Per chart, it stated that pt had a legal guardian. CSW called Ronal Brought, the only contact in his chart. Ronal states she is his cousin and the primary person that looks after him. Ronal states patient does not have a legal guardian.  Ronal states that pt has no spouse, children siblings or parents life. Ronal states that PTA he was living at home alone and she checks on him daily. She makes sure he goes to doctors appointments, gets his medications and he is eating and taking care of himself.  CSW reviewed PT/OT recommendations for SNF. Ronal reports she feels that SNF will b good for the patient but is unsure if the patient would want to go. Ronal gave CSW permission to fax out to facilities in the area. Pt has no preference of facility at this time. CSW gave pt medicare.gov rating list to review. CSW explained insurance auth process.  Ronal also inquired about what will happen if pt refuses SNF. CSW explained that as long as he is competent he will have the right to refuse. Ronal stated she understands.   2pm- CSW spoke with pt at bedside about SNF recc. Patient stated that he is not going to SNF and he wants to return home. CSW attempted to explain reason for SNF. Patient stated he is not going and Ronal cannot make him. FL2 has been completed, Pasrr is pending. CSW did not fax out to facilities.  CSW will continue to follow.   Expected Discharge Plan: Skilled Nursing Facility Barriers to Discharge: Continued Medical Work up   Patient Goals and CMS Choice Patient states their goals for this hospitalization and ongoing recovery are:: SNF CMS Medicare.gov Compare Post Acute Care list provided to::  Patient Choice offered to / list presented to : Patient      Expected Discharge Plan and Services In-house Referral: Clinical Social Work Discharge Planning Services: CM Consult   Living arrangements for the past 2 months: Single Family Home                                      Prior Living Arrangements/Services Living arrangements for the past 2 months: Single Family Home Lives with:: Self Patient language and need for interpreter reviewed:: Yes Do you feel safe going back to the place where you live?: Yes      Need for Family Participation in Patient Care: Yes (Comment) Care giver support system in place?: Yes (comment) Current home services: DME Criminal Activity/Legal Involvement Pertinent to Current Situation/Hospitalization: No - Comment as needed  Activities of Daily Living   ADL Screening (condition at time of admission) Independently performs ADLs?: Yes (appropriate for developmental age) Is the patient deaf or have difficulty hearing?: No Does the patient have difficulty seeing, even when wearing glasses/contacts?: No Does the patient have difficulty concentrating, remembering, or making decisions?: No  Permission Sought/Granted Permission sought to share information with : Family Supports, Oceanographer granted to share information with : Yes, Verbal Permission Granted  Share Information with NAME:  Ronal Brought- cousin  Permission granted to share info w AGENCY: SNF        Emotional Assessment Appearance:: Appears stated age Attitude/Demeanor/Rapport: Engaged Affect (typically observed): Appropriate Orientation: : Oriented to Self, Oriented to Place, Oriented to  Time, Oriented to Situation Alcohol / Substance Use: Not Applicable Psych Involvement: No (comment)  Admission diagnosis:  Sepsis (HCC) [A41.9] Sepsis, due to unspecified organism, unspecified whether acute organ dysfunction present [facility]) [A41.9] Patient Active  Problem List   Diagnosis Date Noted   Depression with anxiety 01/16/2024   Acute ischemic stroke (HCC) 05/03/2023   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/03/2023   Chronic osteomyelitis (HCC) 05/03/2023   Hyperglycemia 03/04/2023   Osteomyelitis of foot, right, acute (HCC) 03/01/2023   Lactic acidosis 03/01/2023   History of transmetatarsal amputation of right foot (HCC) 04/06/2022   Cutaneous abscess of right foot 02/04/2022   Osteomyelitis of great toe of left foot (HCC)    Hyperlipidemia 11/30/2021   Bedbug bite with infection 11/30/2021    Class: Acute   AKI (acute kidney injury) 11/12/2021   Sepsis (HCC) 11/11/2021   Amputated great toe of right foot 02/20/2019   Stage 3a chronic kidney disease (HCC) 02/13/2019   Acute osteomyelitis (HCC)    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/30/2019   Fatty liver 01/09/2019   Thrombocytopenia, unspecified 05/14/2013   Other specified abnormal findings of blood chemistry 05/14/2013   Colon adenoma 09/13/2012   Anxiety 09/06/2012   Intellectual disability 09/06/2012   Chest pain 09/06/2012   GERD (gastroesophageal reflux disease) 03/08/2012   Gout 09/06/2011   Hypertension 03/08/2011   Chronic kidney disease, stage III (moderate) (HCC) 11/30/2009   Urinary tract infection 11/30/2009   PCP:  Marvine Rush, MD Pharmacy:   [facility] [phone] - [place], [place] - [address] AT [facility] [address] [place] [address] Phone: [phone] Fax: [phone]     Social Drivers of Health (SDOH) Social History: SDOH Screenings   Food Insecurity: No Food Insecurity (01/16/2024)  Housing: Low Risk  (01/16/2024)  Transportation Needs: No Transportation Needs (01/16/2024)  Utilities: Not At Risk (01/16/2024)  Financial Resource Strain: Low Risk  (12/13/2021)  Social Connections: Socially Isolated (01/16/2024)  Tobacco Use: Low Risk  (01/19/2024)   SDOH Interventions:     Readmission Risk  Interventions    11/30/2021    2:51 PM  Readmission Risk Prevention Plan  Transportation Screening Complete  HRI or Home Care Consult Complete  Social Work Consult for Recovery Care Planning/Counseling Complete  Palliative Care Screening Not Applicable  Medication Review Oceanographer) Complete

## 2024-01-21 NOTE — Progress Notes (Signed)
 PROGRESS NOTE    [name]  FMW:[id number] DOB: Aug 29, 1950 DOA: 01/16/2024 PCP: Marvine Rush, MD   Brief Narrative: [name] is a 73 y.o. male with a history of hyperlipidemia, CKD stage IIIa, hypertension, SVT, gout, diabetes mellitus type 2, osteomyelitis, GERD.  Patient presented secondary to emesis and found to have concern for sepsis on presentation secondary to right foot osteomyelitis. Empiric antibiotics started and orthopedic surgery consulted and plan surgical management.   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 Orthopedic surgery consulted and performed a first ray amputation on 12/3. Per orthopedic surgery, patient is weightbearing to the right extremity and recommend office follow-up in 1 week.  PT/OT recommending SNF.  Sepsis Present on admission. Secondary to osteomyelitis. Patient with continued fevers. Blood cultures obtained on admission are no growth to date.  Diarrhea Patient with a one week history of diarrhea. No associated abdominal pain. No nausea or vomiting. C. Difficile and GI pathogen panel testing is negative. Symptoms are improved today.  Diabetes mellitus type 2 Poorly controlled with hyperglycemia based on hemoglobin A1C of 9.8%. -Continue insulin  glargine and SSI  Primary hypertension -Continue Toprol  XL  GERD -Continue Protonix   Gout -Continue feboxostat  AKI on CKD stage IIIb Baseline creatinine appears to be around 1.8. Creatinine worsened to 2.42. Unclear but likely related to fluid loss from diarrhea. Improved to 2.01 today and is closer to baseline.  Hypokalemia Resolved with potassium supplementation.  Depression Anxiety Continue Wellbutrin  XL and sertraline .   DVT prophylaxis: Subcutaneous heparin  Code Status:   Code Status: Full Code Family Communication: None at bedside Disposition Plan: Discharge pending ongoing  specialist recommendations   Consultants:  Orthopedic surgerg  Procedures:  None  Antimicrobials: Vancomycin  Ceftriaxone     Subjective: Patient reports his diarrhea has improved. No other concerns.  Objective: BP (!) 147/59 (BP Location: Left Arm)   Pulse 79   Temp 98.6 F (37 C)   Resp 16   Ht (P) 6' 5 (1.956 m)   Wt (P) 103 kg   SpO2 93%   BMI (P) 26.93 kg/m   Examination:  General exam: Appears calm and comfortable. Respiratory system: Clear to auscultation. Respiratory effort normal. Cardiovascular system: S1 & S2 heard, RRR. No murmur. Gastrointestinal system: Abdomen is distended, soft and nontender. Normal bowel sounds heard. Central nervous system: Alert and oriented.    Data Reviewed: I have personally reviewed following labs and imaging studies  CBC Lab Results  Component Value Date   WBC 11.8 (H) 01/21/2024   RBC 3.65 (L) 01/21/2024   HGB 9.7 (L) 01/21/2024   HCT 29.1 (L) 01/21/2024   MCV 79.7 (L) 01/21/2024   MCH 26.6 01/21/2024   PLT 266 01/21/2024   MCHC 33.3 01/21/2024   RDW 14.6 01/21/2024   LYMPHSABS 0.5 (L) 01/18/2024   MONOABS 1.0 01/18/2024   EOSABS 0.1 01/18/2024   BASOSABS 0.0 01/18/2024     Last metabolic panel Lab Results  Component Value Date   NA 136 01/21/2024   K 3.5 01/21/2024   CL 105 01/21/2024   CO2 24 01/21/2024   BUN 33 (H) 01/21/2024   CREATININE 2.01 (H) 01/21/2024   GLUCOSE 137 (H) 01/21/2024   GFRNONAA 35 (L) 01/21/2024   GFRAA >60 02/13/2019   CALCIUM  7.9 (L) 01/21/2024   PHOS 2.3 (L) 01/16/2024   PROT 8.4 (H) 01/16/2024   ALBUMIN 4.1 01/16/2024   BILITOT 1.1 01/16/2024   ALKPHOS  143 (H) 01/16/2024   AST 12 (L) 01/16/2024   ALT 11 01/16/2024   ANIONGAP 7 01/21/2024    GFR: Estimated Creatinine Clearance: 41.9 mL/min (A) (by C-G formula based on SCr of 2.01 mg/dL (H)).  Recent Results (from the past 240 hours)  Culture, blood (routine x 2)     Status: None   Collection Time: 01/16/24 11:00 AM    Specimen: BLOOD  Result Value Ref Range Status   Specimen Description BLOOD BLOOD RIGHT ARM  Final   Special Requests   Final    BOTTLES DRAWN AEROBIC AND ANAEROBIC Blood Culture adequate volume   Culture   Final    NO GROWTH 5 DAYS Performed at [facility], [address] [place], [place] [address]    Report Status 01/21/2024 FINAL  Final  Culture, blood (routine x 2)     Status: None   Collection Time: 01/16/24 11:00 AM   Specimen: BLOOD  Result Value Ref Range Status   Specimen Description BLOOD BLOOD LEFT ARM  Final   Special Requests   Final    BOTTLES DRAWN AEROBIC AND ANAEROBIC Blood Culture adequate volume   Culture   Final    NO GROWTH 5 DAYS Performed at [facility], [address], [place], [place] [address]    Report Status 01/21/2024 FINAL  Final  Resp panel by RT-PCR (RSV, Flu A&B, Covid) Anterior Nasal Swab     Status: None   Collection Time: 01/16/24 11:03 AM   Specimen: Anterior Nasal Swab  Result Value Ref Range Status   SARS Coronavirus 2 by RT PCR NEGATIVE NEGATIVE Final    Comment: (NOTE) SARS-CoV-2 target nucleic acids are NOT DETECTED.  The SARS-CoV-2 RNA is generally detectable in upper respiratory specimens during the acute phase of infection. The lowest concentration of SARS-CoV-2 viral copies this assay can detect is 138 copies/mL. A negative result does not preclude SARS-Cov-2 infection and should not be used as the sole basis for treatment or other patient management decisions. A negative result may occur with  improper specimen collection/handling, submission of specimen other than nasopharyngeal swab, presence of viral mutation(s) within the areas targeted by this assay, and inadequate number of viral copies(<138 copies/mL). A negative result must be combined with clinical observations, patient history, and epidemiological information. The expected result is Negative.  Fact Sheet for Patients:   bloggercourse.com  Fact Sheet for Healthcare Providers:  seriousbroker.it  This test is no t yet approved or cleared by the [place]  FDA and  has been authorized for detection and/or diagnosis of SARS-CoV-2 by FDA under an Emergency Use Authorization (EUA). This EUA will remain  in effect (meaning this test can be used) for the duration of the COVID-19 declaration under Section 564(b)(1) of the Act, 21 U.S.C.section 360bbb-3(b)(1), unless the authorization is terminated  or revoked sooner.       Influenza A by PCR NEGATIVE NEGATIVE Final   Influenza B by PCR NEGATIVE NEGATIVE Final    Comment: (NOTE) The Xpert Xpress SARS-CoV-2/FLU/RSV plus assay is intended as an aid in the diagnosis of influenza from Nasopharyngeal swab specimens and should not be used as a sole basis for treatment. Nasal washings and aspirates are unacceptable for Xpert Xpress SARS-CoV-2/FLU/RSV testing.  Fact Sheet for Patients: bloggercourse.com  Fact Sheet for Healthcare Providers: seriousbroker.it  This test is not yet approved or cleared by the [place]  FDA and has been authorized for detection and/or diagnosis of SARS-CoV-2 by FDA under an Emergency Use Authorization (  EUA). This EUA will remain in effect (meaning this test can be used) for the duration of the COVID-19 declaration under Section 564(b)(1) of the Act, 21 U.S.C. section 360bbb-3(b)(1), unless the authorization is terminated or revoked.     Resp Syncytial Virus by PCR NEGATIVE NEGATIVE Final    Comment: (NOTE) Fact Sheet for Patients: bloggercourse.com  Fact Sheet for Healthcare Providers: seriousbroker.it  This test is not yet approved or cleared by the [place]  FDA and has been authorized for detection and/or diagnosis of SARS-CoV-2 by FDA under an Emergency Use  Authorization (EUA). This EUA will remain in effect (meaning this test can be used) for the duration of the COVID-19 declaration under Section 564(b)(1) of the Act, 21 U.S.C. section 360bbb-3(b)(1), unless the authorization is terminated or revoked.  Performed at [facility], [address], [place], [place] [address]   Gastrointestinal Panel by PCR , Stool     Status: None   Collection Time: 01/18/24  6:49 PM   Specimen: Stool  Result Value Ref Range Status   Campylobacter species NOT DETECTED NOT DETECTED Final   Plesimonas shigelloides NOT DETECTED NOT DETECTED Final   Salmonella species NOT DETECTED NOT DETECTED Final   Yersinia enterocolitica NOT DETECTED NOT DETECTED Final   Vibrio species NOT DETECTED NOT DETECTED Final   Vibrio cholerae NOT DETECTED NOT DETECTED Final   Enteroaggregative E coli (EAEC) NOT DETECTED NOT DETECTED Final   Enteropathogenic E coli (EPEC) NOT DETECTED NOT DETECTED Final   Enterotoxigenic E coli (ETEC) NOT DETECTED NOT DETECTED Final   Shiga like toxin producing E coli (STEC) NOT DETECTED NOT DETECTED Final   Shigella/Enteroinvasive E coli (EIEC) NOT DETECTED NOT DETECTED Final   Cryptosporidium NOT DETECTED NOT DETECTED Final   Cyclospora cayetanensis NOT DETECTED NOT DETECTED Final   Entamoeba histolytica NOT DETECTED NOT DETECTED Final   Giardia lamblia NOT DETECTED NOT DETECTED Final   Adenovirus F40/41 NOT DETECTED NOT DETECTED Final   Astrovirus NOT DETECTED NOT DETECTED Final   Norovirus GI/GII NOT DETECTED NOT DETECTED Final   Rotavirus A NOT DETECTED NOT DETECTED Final   Sapovirus (I, II, IV, and V) NOT DETECTED NOT DETECTED Final    Comment: Performed at [facility], [address] [place], [place] [address]  C Difficile Quick Screen w PCR reflex     Status: None   Collection Time: 01/18/24  6:49 PM   Specimen: STOOL  Result Value Ref Range Status   C Diff antigen NEGATIVE NEGATIVE Final   C Diff toxin NEGATIVE NEGATIVE  Final   C Diff interpretation No C. difficile detected.  Final    Comment: Performed at [facility], [address], [place], [place] [address]      Radiology Studies: No results found.     LOS: 5 days    Elgin Lam, MD [facility] 01/21/2024, 2:09 PM   If 7PM-7AM, please contact night-coverage [email or web address]

## 2024-01-22 ENCOUNTER — Encounter (HOSPITAL_COMMUNITY): Payer: Self-pay | Admitting: Orthopedic Surgery

## 2024-01-22 LAB — GLUCOSE, CAPILLARY
Glucose-Capillary: 110 mg/dL — ABNORMAL HIGH (ref 70–99)
Glucose-Capillary: 117 mg/dL — ABNORMAL HIGH (ref 70–99)
Glucose-Capillary: 121 mg/dL — ABNORMAL HIGH (ref 70–99)
Glucose-Capillary: 206 mg/dL — ABNORMAL HIGH (ref 70–99)
Glucose-Capillary: 142 mg/dL — ABNORMAL HIGH (ref 70–99)

## 2024-01-22 NOTE — Inpatient Diabetes Management (Signed)
 Inpatient Diabetes Program Recommendations  AACE/ADA: New Consensus Statement on Inpatient Glycemic Control (2015)  Target Ranges:  Prepandial:   less than 140 mg/dL      Peak postprandial:   less than 180 mg/dL (1-2 hours)      Critically ill patients:  140 - 180 mg/dL   Lab Results  Component Value Date   GLUCAP 117 (H) 01/22/2024   HGBA1C 9.8 (H) 01/17/2024    Review of Glycemic Control  Latest Reference Range & Units 01/20/24 17:57 01/20/24 21:20 01/21/24 06:18 01/21/24 08:21 01/21/24 12:09 01/21/24 17:22 01/21/24 22:37 01/22/24 06:29 01/22/24 08:42  Glucose-Capillary 70 - 99 mg/dL 767 (H) 830 (H) 853 (H) 130 (H) 202 (H) 173 (H) 125 (H) 110 (H) 117 (H)  (H): Data is abnormally high Diabetes history: Type 2 DM Outpatient Diabetes medications: Toujeo  20 units every day, Amaryl 4 mg QD Current orders for Inpatient glycemic control: Semglee  15 units at bedtime, Novolog  5 units TID, Novolog  0-15 units TID & HS  Inpatient Diabetes Program Recommendations:     Spoke with patient briefly regarding outpatient diabetes management.  Reviewed patient's current A1c of 9.8%. Explained what a A1c is and what it measures. Also reviewed goal A1c with patient, importance of good glucose control @ home, and blood sugar goals. Reviewed patho of DM, need for insulin , role of pancreas, impact of infection, hyper vs hypo glycemia, vascular changes, and commorbidities.  Patient checks blood sugars with a meter, reports three times a day. Reports ranges 90-300 mg/dL. Reviewed recommended frequency and when to call MD.  Florence KID, patient's niece with patient permission. Reports that patient is very independent and will say he has taken medication when he hasn't. In the past compliance has been a big issue and he will become angry with questioning. Family is able to provide reminders and willing to try again. Reviewed CGM with Ronal, and he has used the sensors in the past and is was not helpful and they  are not interested in trying it again.  Ronal assists patient with grocery shopping. Avoids items that are higher in CHO and purchases drinks that are diet. Did have questions for dietitian, so consult placed.  Thanks, Tinnie Minus, MSN, RNC-OB Diabetes Coordinator [phone] (8a-5p)

## 2024-01-22 NOTE — TOC Progression Note (Signed)
 Transition of Care [facility]) - Progression Note    Patient Details  Name: SENDER RUEB MRN: [id number] Date of Birth: May 02, 1950  [facility] CM/SW Contact  Bridget Cordella Simmonds, LCSW Phone Number: 01/22/2024, 3:42 PM  Clinical Narrative:   CSW spoke with pt, again discussed PT recommendation for SNF.  Today, pt is agreeable to CSW sending SNF referral, requesting [facility] as first choice or [facility].  Referral sent out in hub.  CSW reached out to [facility].   PASSR docs uploaded.       Expected Discharge Plan: Skilled Nursing Facility Barriers to Discharge: Continued Medical Work up               Expected Discharge Plan and Services In-house Referral: Clinical Social Work Discharge Planning Services: CM Consult   Living arrangements for the past 2 months: Single Family Home                                       Social Drivers of Health (SDOH) Interventions SDOH Screenings   Food Insecurity: No Food Insecurity (01/16/2024)  Housing: Low Risk  (01/16/2024)  Transportation Needs: No Transportation Needs (01/16/2024)  Utilities: Not At Risk (01/16/2024)  Financial Resource Strain: Low Risk  (12/13/2021)  Social Connections: Socially Isolated (01/16/2024)  Tobacco Use: Low Risk  (01/19/2024)    Readmission Risk Interventions    11/30/2021    2:51 PM  Readmission Risk Prevention Plan  Transportation Screening Complete  HRI or Home Care Consult Complete  Social Work Consult for Recovery Care Planning/Counseling Complete  Palliative Care Screening Not Applicable  Medication Review Oceanographer) Complete

## 2024-01-22 NOTE — Progress Notes (Signed)
 PROGRESS NOTE    [name]  FMW:[id number] DOB: 1950/05/13 DOA: 01/16/2024 PCP: Marvine Rush, MD   Brief Narrative: [name] is a 73 y.o. male with a history of hyperlipidemia, CKD stage IIIa, hypertension, SVT, gout, diabetes mellitus type 2, osteomyelitis, GERD.  Patient presented secondary to emesis and found to have concern for sepsis on presentation secondary to right foot osteomyelitis. Empiric antibiotics started and orthopedic surgery consulted and plan surgical management.   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 Orthopedic surgery consulted and performed a first ray amputation on 12/3. Per orthopedic surgery, patient is weightbearing to the right extremity and recommend office follow-up in 1 week.  PT/OT recommending SNF.  Sepsis Present on admission. Secondary to osteomyelitis. Patient with continued fevers. Blood cultures obtained on admission are no growth to date.  Diarrhea Patient with a one week history of diarrhea. No associated abdominal pain. No nausea or vomiting. C. Difficile and GI pathogen panel testing is negative. Symptoms are improved today.  Diabetes mellitus type 2 Poorly controlled with hyperglycemia based on hemoglobin A1C of 9.8%. -Continue insulin  glargine and SSI  Primary hypertension -Continue Toprol  XL  GERD -Continue Protonix   Gout -Continue feboxostat  AKI on CKD stage IIIb Baseline creatinine appears to be around 1.8. Creatinine worsened to 2.42. Unclear but likely related to fluid loss from diarrhea. Improved to 2.01 today and is closer to baseline.  Hypokalemia Resolved with potassium supplementation.  Depression Anxiety Continue Wellbutrin  XL and sertraline .   DVT prophylaxis: Subcutaneous heparin  Code Status:   Code Status: Full Code Family Communication: None at bedside Disposition Plan: Discharge pending ongoing  specialist recommendations   Consultants:  Orthopedic surgerg  Procedures:  Right foot first ray amputation  Antimicrobials: Vancomycin  Ceftriaxone     Subjective: No specific concerns at this time.  Objective: BP (!) 153/66 (BP Location: Left Arm)   Pulse 79   Temp 98.8 F (37.1 C)   Resp 16   Ht (P) 6' 5 (1.956 m)   Wt (P) 103 kg   SpO2 92%   BMI (P) 26.93 kg/m   Examination:  General exam: Appears calm and comfortable.  Respiratory system: Clear to auscultation. Respiratory effort normal. Cardiovascular system: S1 & S2 heard, RRR. Gastrointestinal system: Abdomen is nondistended, soft and nontender. Normal bowel sounds heard. Central nervous system: Alert and oriented. No focal neurological deficits. Psychiatry: Judgement and insight appear normal. Mood & affect appropriate.    Data Reviewed: I have personally reviewed following labs and imaging studies  CBC Lab Results  Component Value Date   WBC 11.8 (H) 01/21/2024   RBC 3.65 (L) 01/21/2024   HGB 9.7 (L) 01/21/2024   HCT 29.1 (L) 01/21/2024   MCV 79.7 (L) 01/21/2024   MCH 26.6 01/21/2024   PLT 266 01/21/2024   MCHC 33.3 01/21/2024   RDW 14.6 01/21/2024   LYMPHSABS 0.5 (L) 01/18/2024   MONOABS 1.0 01/18/2024   EOSABS 0.1 01/18/2024   BASOSABS 0.0 01/18/2024     Last metabolic panel Lab Results  Component Value Date   NA 136 01/21/2024   K 3.5 01/21/2024   CL 105 01/21/2024   CO2 24 01/21/2024   BUN 33 (H) 01/21/2024   CREATININE 2.01 (H) 01/21/2024   GLUCOSE 137 (H) 01/21/2024   GFRNONAA 35 (L) 01/21/2024   GFRAA >60 02/13/2019   CALCIUM  7.9 (L) 01/21/2024   PHOS 2.3 (L) 01/16/2024   PROT 8.4 (H)  01/16/2024   ALBUMIN 4.1 01/16/2024   BILITOT 1.1 01/16/2024   ALKPHOS 143 (H) 01/16/2024   AST 12 (L) 01/16/2024   ALT 11 01/16/2024   ANIONGAP 7 01/21/2024    GFR: Estimated Creatinine Clearance: 41.9 mL/min (A) (by C-G formula based on SCr of 2.01 mg/dL (H)).  Recent Results (from  the past 240 hours)  Culture, blood (routine x 2)     Status: None   Collection Time: 01/16/24 11:00 AM   Specimen: BLOOD  Result Value Ref Range Status   Specimen Description BLOOD BLOOD RIGHT ARM  Final   Special Requests   Final    BOTTLES DRAWN AEROBIC AND ANAEROBIC Blood Culture adequate volume   Culture   Final    NO GROWTH 5 DAYS Performed at [facility], [address] [place], [place] [address]    Report Status 01/21/2024 FINAL  Final  Culture, blood (routine x 2)     Status: None   Collection Time: 01/16/24 11:00 AM   Specimen: BLOOD  Result Value Ref Range Status   Specimen Description BLOOD BLOOD LEFT ARM  Final   Special Requests   Final    BOTTLES DRAWN AEROBIC AND ANAEROBIC Blood Culture adequate volume   Culture   Final    NO GROWTH 5 DAYS Performed at [facility], [address] [place], [place] [address]    Report Status 01/21/2024 FINAL  Final  Resp panel by RT-PCR (RSV, Flu A&B, Covid) Anterior Nasal Swab     Status: None   Collection Time: 01/16/24 11:03 AM   Specimen: Anterior Nasal Swab  Result Value Ref Range Status   SARS Coronavirus 2 by RT PCR NEGATIVE NEGATIVE Final    Comment: (NOTE) SARS-CoV-2 target nucleic acids are NOT DETECTED.  The SARS-CoV-2 RNA is generally detectable in upper respiratory specimens during the acute phase of infection. The lowest concentration of SARS-CoV-2 viral copies this assay can detect is 138 copies/mL. A negative result does not preclude SARS-Cov-2 infection and should not be used as the sole basis for treatment or other patient management decisions. A negative result may occur with  improper specimen collection/handling, submission of specimen other than nasopharyngeal swab, presence of viral mutation(s) within the areas targeted by this assay, and inadequate number of viral copies(<138 copies/mL). A negative result must be combined with clinical observations, patient history, and epidemiological information.  The expected result is Negative.  Fact Sheet for Patients:  bloggercourse.com  Fact Sheet for Healthcare Providers:  seriousbroker.it  This test is no t yet approved or cleared by the [place]  FDA and  has been authorized for detection and/or diagnosis of SARS-CoV-2 by FDA under an Emergency Use Authorization (EUA). This EUA will remain  in effect (meaning this test can be used) for the duration of the COVID-19 declaration under Section 564(b)(1) of the Act, 21 U.S.C.section 360bbb-3(b)(1), unless the authorization is terminated  or revoked sooner.       Influenza A by PCR NEGATIVE NEGATIVE Final   Influenza B by PCR NEGATIVE NEGATIVE Final    Comment: (NOTE) The Xpert Xpress SARS-CoV-2/FLU/RSV plus assay is intended as an aid in the diagnosis of influenza from Nasopharyngeal swab specimens and should not be used as a sole basis for treatment. Nasal washings and aspirates are unacceptable for Xpert Xpress SARS-CoV-2/FLU/RSV testing.  Fact Sheet for Patients: bloggercourse.com  Fact Sheet for Healthcare Providers: seriousbroker.it  This test is not yet approved or cleared by the [place]  FDA and has been  authorized for detection and/or diagnosis of SARS-CoV-2 by FDA under an Emergency Use Authorization (EUA). This EUA will remain in effect (meaning this test can be used) for the duration of the COVID-19 declaration under Section 564(b)(1) of the Act, 21 U.S.C. section 360bbb-3(b)(1), unless the authorization is terminated or revoked.     Resp Syncytial Virus by PCR NEGATIVE NEGATIVE Final    Comment: (NOTE) Fact Sheet for Patients: bloggercourse.com  Fact Sheet for Healthcare Providers: seriousbroker.it  This test is not yet approved or cleared by the [place]  FDA and has been authorized for detection and/or  diagnosis of SARS-CoV-2 by FDA under an Emergency Use Authorization (EUA). This EUA will remain in effect (meaning this test can be used) for the duration of the COVID-19 declaration under Section 564(b)(1) of the Act, 21 U.S.C. section 360bbb-3(b)(1), unless the authorization is terminated or revoked.  Performed at [facility], [address] [place], [place] [address]   Gastrointestinal Panel by PCR , Stool     Status: None   Collection Time: 01/18/24  6:49 PM   Specimen: Stool  Result Value Ref Range Status   Campylobacter species NOT DETECTED NOT DETECTED Final   Plesimonas shigelloides NOT DETECTED NOT DETECTED Final   Salmonella species NOT DETECTED NOT DETECTED Final   Yersinia enterocolitica NOT DETECTED NOT DETECTED Final   Vibrio species NOT DETECTED NOT DETECTED Final   Vibrio cholerae NOT DETECTED NOT DETECTED Final   Enteroaggregative E coli (EAEC) NOT DETECTED NOT DETECTED Final   Enteropathogenic E coli (EPEC) NOT DETECTED NOT DETECTED Final   Enterotoxigenic E coli (ETEC) NOT DETECTED NOT DETECTED Final   Shiga like toxin producing E coli (STEC) NOT DETECTED NOT DETECTED Final   Shigella/Enteroinvasive E coli (EIEC) NOT DETECTED NOT DETECTED Final   Cryptosporidium NOT DETECTED NOT DETECTED Final   Cyclospora cayetanensis NOT DETECTED NOT DETECTED Final   Entamoeba histolytica NOT DETECTED NOT DETECTED Final   Giardia lamblia NOT DETECTED NOT DETECTED Final   Adenovirus F40/41 NOT DETECTED NOT DETECTED Final   Astrovirus NOT DETECTED NOT DETECTED Final   Norovirus GI/GII NOT DETECTED NOT DETECTED Final   Rotavirus A NOT DETECTED NOT DETECTED Final   Sapovirus (I, II, IV, and V) NOT DETECTED NOT DETECTED Final    Comment: Performed at [facility], [address] [place], [place] [address]  C Difficile Quick Screen w PCR reflex     Status: None   Collection Time: 01/18/24  6:49 PM   Specimen: STOOL  Result Value Ref Range Status   C Diff antigen  NEGATIVE NEGATIVE Final   C Diff toxin NEGATIVE NEGATIVE Final   C Diff interpretation No C. difficile detected.  Final    Comment: Performed at [facility], [address] [place], [place] [address]      Radiology Studies: No results found.     LOS: 6 days    Elgin Lam, MD [facility] 01/22/2024, 1:08 PM   If 7PM-7AM, please contact night-coverage [email or web address]

## 2024-01-22 NOTE — Evaluation (Signed)
 Occupational Therapy Evaluation Patient Details Name: [name] MRN: [id number] DOB: [birth year] Today's Date: 01/22/2024   History of Present Illness   73 y.o. male admitted 01/16/24 for sepsis secondary to right foot osteomyelitis. Pt s/p R first ray amputation 12/5. PMHx: T2DM, nephropathy, GERD, HTN, HLD, CKD stage IIIa, and SVT.     Clinical Impressions PTA Pt was Mod I with RW for functional mobility, independent with ADLs, and required assistance for IADLs. Pt currently requires up to Mod A for functional transfers and up to Max A for ADL engagement. Pt is primarily limited by decreased activity tolerance, RLE TDWB precautions, and unsteadiness on feet. OT to continue to follow Pt acutely to facilitate progress towards goals. Patient will benefit from continued inpatient follow up therapy, <3 hours/day. Pt has the potential to progress to a level where return home is appropriate and safe.      If plan is discharge home, recommend the following:   A little help with walking and/or transfers;A little help with bathing/dressing/bathroom;Assistance with cooking/housework;Direct supervision/assist for medications management;Direct supervision/assist for financial management;Assist for transportation;Help with stairs or ramp for entrance     Functional Status Assessment   Patient has had a recent decline in their functional status and demonstrates the ability to make significant improvements in function in a reasonable and predictable amount of time.     Equipment Recommendations   BSC/3in1;Wheelchair (measurements OT);Wheelchair cushion (measurements OT)     Recommendations for Other Services         Precautions/Restrictions   Precautions Precautions: Fall Recall of Precautions/Restrictions: Impaired Required Braces or Orthoses: Other Brace Other Brace: Post-Op Shoe RLE Restrictions Weight Bearing Restrictions Per Provider Order: Yes     Mobility Bed  Mobility Overal bed mobility: Needs Assistance Bed Mobility: Supine to Sit     Supine to sit: Mod assist     General bed mobility comments: Pt sat up on R side of bed, requested HHA to elevate trunk off of bed. Able to manage BLE independently    Transfers Overall transfer level: Needs assistance Equipment used: Rolling walker (2 wheels) Transfers: Sit to/from Stand, Bed to chair/wheelchair/BSC Sit to Stand: Mod assist, From elevated surface     Step pivot transfers: Min assist     General transfer comment: Pt stood from bed slightly elevated with Mod A +1 to power up from bed. Initial verbal cue for proper hand placement on RW. Pt able to step pivot to recliner with Min A. Good balance in standing with RW. Pt maintained minimal weight bearing on RLE only allowing the post-op shoe to briefly rest on the floor without weight acceptance. Decreased control to sit.      Balance Overall balance assessment: Needs assistance Sitting-balance support: No upper extremity supported, Feet supported Sitting balance-Leahy Scale: Fair Sitting balance - Comments: Pt sat EOB to don post op shoe. Noted minimal posterior lean during dynamic sitting tasks. Postural control: Posterior lean Standing balance support: Bilateral upper extremity supported, During functional activity, Reliant on assistive device for balance Standing balance-Leahy Scale: Poor Standing balance comment: Dependent on RW and external support                           ADL either performed or assessed with clinical judgement   ADL Overall ADL's : Needs assistance/impaired Eating/Feeding: Independent;Sitting   Grooming: Set up;Sitting   Upper Body Bathing: Supervision/ safety;Sitting   Lower Body Bathing: Contact guard assist;Sitting/lateral leans  Upper Body Dressing : Supervision/safety;Sitting   Lower Body Dressing: Minimal assistance;Sitting/lateral leans   Toilet Transfer: Minimal  assistance;Stand-pivot;BSC/3in1;Rolling walker (2 wheels)   Toileting- Clothing Manipulation and Hygiene: Maximal assistance;Bed level               Vision Patient Visual Report: No change from baseline Vision Assessment?: No apparent visual deficits     Perception         Praxis         Pertinent Vitals/Pain Pain Assessment Pain Assessment: 0-10 Pain Score: 8  Pain Location: R heel Pain Descriptors / Indicators: Discomfort, Grimacing, Guarding Pain Intervention(s): Limited activity within patient's tolerance, Monitored during session, Repositioned, Patient requesting pain meds-RN notified     Extremity/Trunk Assessment Upper Extremity Assessment Upper Extremity Assessment: Overall WFL for tasks assessed   Lower Extremity Assessment Lower Extremity Assessment: Defer to PT evaluation   Cervical / Trunk Assessment Cervical / Trunk Assessment: Normal   Communication Communication Communication: Impaired Factors Affecting Communication: Reduced clarity of speech   Cognition Arousal: Alert Behavior During Therapy: WFL for tasks assessed/performed Cognition: Difficult to assess Difficult to assess due to: Impaired communication           OT - Cognition Comments: DIfficult to assess cognition as Pt with reduced clarity of speech. Pleasant and agreeable throughout session, enjoyed conversation pertaining to snowfall.                 Following commands: Intact       Cueing  General Comments   Cueing Techniques: Verbal cues;Gestural cues  Pt greeted soiled in bed from BM. OT completed posterior peri care bed level, Pt able to turn to left and right without assistance. Removed dirty linen and replaced clean bed pads.   Exercises     Shoulder Instructions      Home Living Family/patient expects to be discharged to:: Private residence Living Arrangements: Alone Available Help at Discharge: Family;Available PRN/intermittently (Niece can check in on  him after work and more consistently on the weekends.) Type of Home: House Home Access: Stairs to enter Secretary/administrator of Steps: 1 Entrance Stairs-Rails: None Home Layout: One level     Bathroom Shower/Tub: Chief Strategy Officer: Standard Bathroom Accessibility: Yes   Home Equipment: Agricultural Consultant (2 wheels);Shower seat;Hand held shower head   Additional Comments: has a publishing copy an boxer      Prior Functioning/Environment Prior Level of Function : Independent/Modified Independent             Mobility Comments: Ambulates using RW. Reports several near falls d/t LOB. ADLs Comments: Indep with ADLs. Stands to shower. Cooks on stove. Niece aids with IADLs including taking him to appointments, manages meds, and pays the bills for him.    OT Problem List: Decreased strength;Decreased activity tolerance;Impaired balance (sitting and/or standing);Decreased knowledge of use of DME or AE;Decreased knowledge of precautions;Pain   OT Treatment/Interventions: Self-care/ADL training;Therapeutic exercise;Energy conservation;DME and/or AE instruction;Therapeutic activities;Patient/family education;Balance training      OT Goals(Current goals can be found in the care plan section)   Acute Rehab OT Goals Patient Stated Goal: To get better OT Goal Formulation: With patient Time For Goal Achievement: 02/05/24 Potential to Achieve Goals: Good ADL Goals Pt Will Perform Lower Body Bathing: with modified independence;sitting/lateral leans;with adaptive equipment Pt Will Perform Lower Body Dressing: with supervision;sitting/lateral leans Pt Will Transfer to Toilet: with supervision;stand pivot transfer;bedside commode Pt Will Perform Toileting - Clothing Manipulation and hygiene: with supervision;sitting/lateral leans Additional ADL  Goal #1: Pt will engage in bed mobility with CGA as a precursor to engagement in ADL taks OOB.   OT Frequency:  Min 2X/week     Co-evaluation              AM-PAC OT 6 Clicks Daily Activity     Outcome Measure Help from another person eating meals?: None Help from another person taking care of personal grooming?: A Little Help from another person toileting, which includes using toliet, bedpan, or urinal?: A Lot Help from another person bathing (including washing, rinsing, drying)?: A Little Help from another person to put on and taking off regular upper body clothing?: A Little Help from another person to put on and taking off regular lower body clothing?: A Little 6 Click Score: 18   End of Session Equipment Utilized During Treatment: Gait belt;Rolling walker (2 wheels) Nurse Communication: Mobility status;Patient requests pain meds  Activity Tolerance: Patient tolerated treatment well Patient left: in chair;with call bell/phone within reach;with chair alarm set  OT Visit Diagnosis: Unsteadiness on feet (R26.81);Muscle weakness (generalized) (M62.81);Pain Pain - Right/Left: Right Pain - part of body:  (foot)                Time: 9193-9167 OT Time Calculation (min): 26 min Charges:  OT General Charges $OT Visit: 1 Visit OT Evaluation $OT Eval Low Complexity: 1 Low OT Treatments $Self Care/Home Management : 8-22 mins  Maurilio CROME, OTR/L.  [facility] Acute Rehabilitation  Office: [phone]   Maurilio PARAS Vernessa Likes 01/22/2024, 9:50 AM

## 2024-01-22 NOTE — Care Management Important Message (Signed)
 Important Message  Patient Details  Name: [name] MRN: [id number] Date of Birth: 12-04-1950   Important Message Given:  Yes - Medicare IM     [name] 01/22/2024, 12:36 PM

## 2024-01-23 LAB — GLUCOSE, CAPILLARY
Glucose-Capillary: 155 mg/dL — ABNORMAL HIGH (ref 70–99)
Glucose-Capillary: 152 mg/dL — ABNORMAL HIGH (ref 70–99)
Glucose-Capillary: 160 mg/dL — ABNORMAL HIGH (ref 70–99)
Glucose-Capillary: 162 mg/dL — ABNORMAL HIGH (ref 70–99)

## 2024-01-23 MED ORDER — JUVEN PO PACK
1.0000 | PACK | Freq: Two times a day (BID) | ORAL | Status: DC
  Administered 2024-01-23 (×2): 1 via ORAL
  Filled 2024-01-23 (×3): qty 1

## 2024-01-23 MED ORDER — ADULT MULTIVITAMIN W/MINERALS CH
1.0000 | ORAL_TABLET | Freq: Every day | ORAL | Status: DC
  Administered 2024-01-23 – 2024-01-24 (×2): 1 via ORAL
  Filled 2024-01-23 (×2): qty 1

## 2024-01-23 MED ORDER — THIAMINE MONONITRATE 100 MG PO TABS
100.0000 mg | ORAL_TABLET | Freq: Every day | ORAL | Status: DC
  Administered 2024-01-23 – 2024-01-24 (×2): 100 mg via ORAL
  Filled 2024-01-23 (×2): qty 1

## 2024-01-23 NOTE — Progress Notes (Signed)
 Physical Therapy Treatment Patient Details Name: [name] MRN: [id number] DOB: 07-02-1950 Today's Date: 01/23/2024   History of Present Illness 73 y.o. male admitted 01/16/24 for sepsis secondary to right foot osteomyelitis. Pt s/p R first ray amputation 12/5. PMHx: T2DM, nephropathy, GERD, HTN, HLD, CKD stage IIIa, and SVT.    PT Comments  Pt received in supine, c/o malaise, stating I'm not gonna make it home, pt given reassurance he will go to post-acute rehab prior to home, RN notified of pt c/o and nausea with postural changes as well as BP readings, other VSS on RA. Pt needing up to maxA +2 for sit<>stand from EOB and chair to RW with RLE TDWB. Pt needing up to +2 modA for 62ft gait trial at bedside with RW and chair follow for safety, sitting after less than household distance due to increased c/o nausea and severe c/o fatigue. Pt agreeable to sit up in recliner post-exertion, chair alarm on for safety, RN and NT notified therapist recommends Stedy for return transfer given pt increased lift assist needs this date, board updated as well. Patient will benefit from continued inpatient follow up therapy, <3 hours/day.   Pulse Rate 84  Pulse Rate Source Dinamap  BP (!) 183/83 (c/o nausea)  BP Location Left Arm  BP Method Automatic  Patient Position (if appropriate) Sitting (EOB)   Pulse Rate 86  Pulse Rate Source Dinamap  BP (!) 164/79 (c/o nausea)  BP Location Left Arm  BP Method Automatic  Patient Position (if appropriate) Sitting (in chair after pivot OOB)  Oxygen Therapy  SpO2 100 %  O2 Device Room Air     If plan is discharge home, recommend the following: A lot of help with bathing/dressing/bathroom;Assistance with cooking/housework;Assist for transportation;Help with stairs or ramp for entrance;Two people to help with walking and/or transfers   Can travel by private vehicle     No (not today)  Equipment Recommendations  Wheelchair (measurements PT);Wheelchair  cushion (measurements PT);BSC/3in1 (removable arm rest for DME at current LOF)    Recommendations for Other Services       Precautions / Restrictions Precautions Precautions: Fall Recall of Precautions/Restrictions: Impaired Precaution/Restrictions Comments: decreased recall of WB precs and safe movement Required Braces or Orthoses: Other Brace Other Brace: Post-Op Shoe RLE Restrictions Weight Bearing Restrictions Per Provider Order: Yes RLE Weight Bearing Per Provider Order: Touchdown weight bearing Other Position/Activity Restrictions: Per Dr. Harden Orthopedic note 12/6 minimize weightbearing to the right lower extremity     Mobility  Bed Mobility Overal bed mobility: Needs Assistance Bed Mobility: Supine to Sit     Supine to sit: Min assist, Used rails     General bed mobility comments: Pt sat up on R side of bed, requested HHA to elevate trunk off of bed. Able to manage BLE independently    Transfers Overall transfer level: Needs assistance Equipment used: Rolling walker (2 wheels) Transfers: Sit to/from Stand, Bed to chair/wheelchair/BSC Sit to Stand: Max assist, +2 physical assistance           General transfer comment: From lower bed height to RW with +2 maxA, pt attempted to stand unassisted    Ambulation/Gait Ambulation/Gait assistance: Mod assist, +2 physical assistance, +2 safety/equipment Gait Distance (Feet): 5 Feet Assistive device: Rolling walker (2 wheels) Gait Pattern/deviations: Step-to pattern, Decreased dorsiflexion - right, Decreased step length - left, Decreased stance time - right, Decreased weight shift to right, Narrow base of support       General Gait Details: Cues  for posture, RW sequencing and hop-to vs step-to pattern with RLE TWB precs, pt with poor carryover from previous session and states i can't but with step by step cues and heavy assist for RW management/sequencing, pt able to walk a few feet forward until nausea/fatigue  increased and chair pulled up behind him to sit down. RN notified Herby would be safer for him for return transfer to bed given pt difficulty mobilizing this morning.   Stairs             Wheelchair Mobility     Tilt Bed    Modified Rankin (Stroke Patients Only)       Balance Overall balance assessment: Needs assistance Sitting-balance support: No upper extremity supported, Feet supported Sitting balance-Leahy Scale: Fair     Standing balance support: Bilateral upper extremity supported, During functional activity, Reliant on assistive device for balance Standing balance-Leahy Scale: Poor Standing balance comment: Dependent on RW and external support, unsteady with dynamic standing tasks                            Communication Communication Communication: Impaired Factors Affecting Communication: Reduced clarity of speech  Cognition Arousal: Alert Behavior During Therapy: WFL for tasks assessed/performed   PT - Cognitive impairments: No family/caregiver present to determine baseline, Difficult to assess, Safety/Judgement, Sequencing, Memory, Problem solving Difficult to assess due to: Impaired communication                     PT - Cognition Comments: Difficult to understand pt as he is lacking dentition, of note pt states I'm not going to make it home PTA reinforced he is going to rehab first, pt states I'm not feeling good, unsure if he is fully oriented. Pt unable to state his TWB restrictions today, PTA reinforced precs. Following commands: Intact      Cueing Cueing Techniques: Verbal cues, Gestural cues, Tactile cues  Exercises Other Exercises Other Exercises: seated BLE AROM: LAQ x10 reps ea    General Comments General comments (skin integrity, edema, etc.): Pt reports he made a mess in the bed however bed pad not soiled, anticipate pt possibly had gas or small amount of urination? Bed pad removed and new linens placed in pt's  chair, RN notified of pt c/o nausea and BP readings with postural changes (see above); HR and SpO2 WFL on RA.      Pertinent Vitals/Pain Pain Assessment Pain Assessment: Faces Faces Pain Scale: Hurts little more Pain Location: R heel, generalized malaise Pain Descriptors / Indicators: Discomfort, Grimacing, Guarding Pain Intervention(s): Limited activity within patient's tolerance, Monitored during session, Repositioned    Home Living                          Prior Function            PT Goals (current goals can now be found in the care plan section) Acute Rehab PT Goals Patient Stated Goal: Return Home PT Goal Formulation: With patient Time For Goal Achievement: 02/03/24 Progress towards PT goals: Progressing toward goals (slowly)    Frequency    Min 2X/week      PT Plan      Co-evaluation              AM-PAC PT 6 Clicks Mobility   Outcome Measure  Help needed turning from your back to your side while in a flat bed  without using bedrails?: A Little Help needed moving from lying on your back to sitting on the side of a flat bed without using bedrails?: A Little Help needed moving to and from a bed to a chair (including a wheelchair)?: A Lot Help needed standing up from a chair using your arms (e.g., wheelchair or bedside chair)?: Total Help needed to walk in hospital room?: Total Help needed climbing 3-5 steps with a railing? : Total 6 Click Score: 11    End of Session Equipment Utilized During Treatment: Gait belt;Other (comment) (RLE post-op shoe) Activity Tolerance: Patient limited by fatigue;Treatment limited secondary to medical complications (Comment);Other (comment) (elevated BP, pt c/o nausea) Patient left: in chair;with call bell/phone within reach;with chair alarm set;Other (comment) (reclined in chair) Nurse Communication: Mobility status;Need for lift equipment;Precautions;Other (comment) (pt may be more confused, c/o nausea, use Stedy  for transfers today) PT Visit Diagnosis: Difficulty in walking, not elsewhere classified (R26.2);Muscle weakness (generalized) (M62.81);Other abnormalities of gait and mobility (R26.89);Unsteadiness on feet (R26.81)     Time: 8890-8867 PT Time Calculation (min) (ACUTE ONLY): 23 min  Charges:    $Therapeutic Activity: 23-37 mins PT General Charges $$ ACUTE PT VISIT: 1 Visit                     Maretta Overdorf P., PTA Acute Rehabilitation Services Secure Chat Preferred 9a-5:30pm Office: [phone]    Connell HERO [facility] 01/23/2024, 11:58 AM

## 2024-01-23 NOTE — Progress Notes (Addendum)
 Initial Nutrition Assessment  DOCUMENTATION CODES:  Non-severe (moderate) malnutrition in context of chronic illness  INTERVENTION:  Multivitamin PO once daily. 100 mg thiamine  PO once daily for 7 days. 1 packet Juven BID to support wound healing. Each packet provides 95 calories, 2.5 grams of protein (collagen), and 9.8 grams of carbohydrate (3 grams sugar); also contains 7 grams of L-arginine and L-glutamine, 300 mg vitamin C, 15 mg vitamin E, 1.2 mcg vitamin B-12, 9.5 mg zinc, 200 mg calcium , and 1.5 g Calcium  Beta-hydroxy-Beta-methylbutyrate. Continue Ensure Plus High Protein PO BID. Each supplement provides 350 Kcals and 20 grams of protein. Continue carb-modified diet.  NUTRITION DIAGNOSIS:  Moderate Malnutrition related to chronic illness as evidenced by mild fat depletion, moderate muscle depletion, energy intake < 75% for > or equal to 1 month   GOAL:  Patient will meet greater than or equal to 90% of their needs   MONITOR:  PO intake, Supplement acceptance, Labs, Weight trends  REASON FOR ASSESSMENT:  Consult Diet education, Assessment of nutrition requirement/status  ASSESSMENT:  Patient presented with emesis and diarrhea and was found to have R foot osteomyelitis with sepsis. PMH significant for DM2, CKD 3a, HTN, dyslipidemia, osteomyelitis, multiple toe amputations 2023, gout, GERD, anxiety/depression, speech impediment.  12/02 admit 12/05 toe amputation  Visited the patient who is somewhat difficult to understand due to speech impediment. He states he has not been able to eat due to severe nausea. He is, however, drinking 2 Ensures daily, just like he does at home. He does not think he can drink 3 a day at this time. Per attending, the patient's niece Ronal [phone] had reported intolerance of Glucerna but attempted phone call to her for details went unanswered. He tells me he was eating well PTA but has lost weight since he got sick in November. He is uncertain of his  UBW or amount of loss. He is active at home and walks his 72 year old dog who loves to chase a cat in the yard. He lives at home independently and cooks for himself. A typical day's food recall reveals insufficient protein intake so I encouraged the patient to ensure he eats at least one form of protein with each meal. He was unfamiliar with what a protein truly is so we discussed potential sources.  Typical day's intake: Breakfast - egg, meat, coffee Lunch - banana sandwich Dinner - TV dinner and cake ONS - drinks 2 Ensures daily  Scheduled Meds:  atorvastatin   10 mg Oral QHS   buPROPion   150 mg Oral QHS   febuxostat   80 mg Oral Daily   feeding supplement  237 mL Oral BID BM   fentaNYL  (SUBLIMAZE ) injection  50 mcg Intravenous Once   heparin   5,000 Units Subcutaneous Q8H   hydrALAZINE   25 mg Oral BID   insulin  aspart  0-15 Units Subcutaneous TID WC   insulin  aspart  0-5 Units Subcutaneous QHS   insulin  aspart  5 Units Subcutaneous TID WC   insulin  glargine-yfgn  15 Units Subcutaneous QHS   metoprolol  succinate  100 mg Oral Daily   pantoprazole   40 mg Oral Daily   sertraline   100 mg Oral QHS   sodium bicarbonate   650 mg Oral BID     Diet Order             Diet Carb Modified Fluid consistency: Thin; Room service appropriate? Yes  Diet effective now  Meal Intake: 25%  Labs:     Latest Ref Rng & Units 01/21/2024    7:54 AM 01/20/2024    6:59 AM 01/19/2024    5:01 AM  CMP  Glucose 70 - 99 mg/dL 862  824  861   BUN 8 - 23 mg/dL 33  35  33   Creatinine 0.61 - 1.24 mg/dL 7.98  7.71  7.64   Sodium 135 - 145 mmol/L 136  134  136   Potassium 3.5 - 5.1 mmol/L 3.5  3.2  3.2   Chloride 98 - 111 mmol/L 105  103  101   CO2 22 - 32 mmol/L 24  22  23    Calcium  8.9 - 10.3 mg/dL 7.9  7.4  7.9      I/O: -245 mL since admit  NUTRITION - FOCUSED PHYSICAL EXAM: Flowsheet Row Most Recent Value  Orbital Region Mild depletion  Upper Arm Region No depletion   Thoracic and Lumbar Region No depletion  Buccal Region Mild depletion  Temple Region Moderate depletion  Clavicle Bone Region No depletion  Clavicle and Acromion Bone Region Mild depletion  Scapular Bone Region No depletion  Dorsal Hand No depletion  Patellar Region No depletion  Anterior Thigh Region Moderate depletion  Posterior Calf Region Severe depletion  Edema (RD Assessment) Mild  [non-pitting RLE]  Hair Reviewed  Eyes Reviewed  Mouth Other (Comment)  [missing teeth]  Skin Reviewed  Nails Reviewed    EDUCATION NEEDS:  Education needs have been addressed  Skin:  Skin Assessment: Skin Integrity Issues: Skin Integrity Issues:: Incisions Incisions: foot  Last BM:  12/9 type 5  Height:  Ht Readings from Last 1 Encounters:  01/19/24 (P) 6' 5 (1.956 m)   Weight:  Wt Readings from Last 10 Encounters:  01/19/24 (P) 103 kg  06/03/23 103.2 kg  05/03/23 90.7 kg  03/25/23 99.8 kg  03/01/23 99.8 kg  05/17/22 95.3 kg  04/07/22 100.7 kg  02/16/22 99.3 kg  02/04/22 102.1 kg  12/28/21 98 kg   Weight Change: relatively stable weight noted per chart although patient states an unknown amount of weight loss since 12/2023  Usual Body Weight: patient unaware  Edema: non-pitting RLE  Ideal Body Weight:  94 kg   BMI:  Body mass index is 26.93 kg/m (pended).  Estimated Nutritional Needs:  Kcal:  2400-2700 Protein:  150-180 Fluid:  >/=2400    Leverne Ruth, MS, RDN, LDN [place] See AMION for contact information Secure chat preferred

## 2024-01-23 NOTE — Progress Notes (Signed)
 PROGRESS NOTE    [name]  FMW:[id number] DOB: July 22, 1950 DOA: 01/16/2024 PCP: Marvine Rush, MD   Brief Narrative: [name] is a 73 y.o. male with a history of hyperlipidemia, CKD stage IIIa, hypertension, SVT, gout, diabetes mellitus type 2, osteomyelitis, GERD.  Patient presented secondary to emesis and found to have concern for sepsis on presentation secondary to right foot osteomyelitis. Empiric antibiotics started and orthopedic surgery consulted and plan surgical management.   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 Orthopedic surgery consulted and performed a first ray amputation on 12/3. Per orthopedic surgery, patient is weightbearing to the right extremity and recommend office follow-up in 1 week.  PT/OT recommending SNF.  Sepsis Present on admission. Secondary to osteomyelitis. Patient with continued fevers. Blood cultures obtained on admission are no growth to date.  Diarrhea Patient with a one week history of diarrhea. No associated abdominal pain. No nausea or vomiting. C. Difficile and GI pathogen panel testing is negative. Symptoms are improved.  Diabetes mellitus type 2 Poorly controlled with hyperglycemia based on hemoglobin A1C of 9.8%. -Continue insulin  glargine and SSI  Primary hypertension -Continue Toprol  XL  GERD -Continue Protonix   Gout -Continue feboxostat  AKI on CKD stage IIIb Baseline creatinine appears to be around 1.8. Creatinine worsened to 2.42. Unclear but likely related to fluid loss from diarrhea. Improved to 2.01 today and is closer to baseline.  Hypokalemia Resolved with potassium supplementation.  Depression Anxiety Continue Wellbutrin  XL and sertraline .   DVT prophylaxis: Subcutaneous heparin  Code Status:   Code Status: Full Code Family Communication: None at bedside Disposition Plan: Discharge to SNF pending bed  availability and insurance. Medically stable for discharge.   Consultants:  Orthopedic surgerg  Procedures:  Right foot first ray amputation  Antimicrobials: Vancomycin  Ceftriaxone     Subjective: No issues this morning. Heel pain is improved.  Objective: BP (!) 169/72 (BP Location: Left Arm)   Pulse 78   Temp 98.7 F (37.1 C)   Resp 16   Ht (P) 6' 5 (1.956 m)   Wt (P) 103 kg   SpO2 94%   BMI (P) 26.93 kg/m   Examination:  General exam: Appears calm and comfortable.  Respiratory system: Clear to auscultation. Respiratory effort normal. Cardiovascular system: S1 & S2 heard, RRR. Gastrointestinal system: Abdomen is nondistended, soft and nontender. Normal bowel sounds heard. Central nervous system: Alert and oriented. Psychiatry: Judgement and insight appear normal. Mood & affect appropriate.    Data Reviewed: I have personally reviewed following labs and imaging studies  CBC Lab Results  Component Value Date   WBC 11.8 (H) 01/21/2024   RBC 3.65 (L) 01/21/2024   HGB 9.7 (L) 01/21/2024   HCT 29.1 (L) 01/21/2024   MCV 79.7 (L) 01/21/2024   MCH 26.6 01/21/2024   PLT 266 01/21/2024   MCHC 33.3 01/21/2024   RDW 14.6 01/21/2024   LYMPHSABS 0.5 (L) 01/18/2024   MONOABS 1.0 01/18/2024   EOSABS 0.1 01/18/2024   BASOSABS 0.0 01/18/2024     Last metabolic panel Lab Results  Component Value Date   NA 136 01/21/2024   K 3.5 01/21/2024   CL 105 01/21/2024   CO2 24 01/21/2024   BUN 33 (H) 01/21/2024   CREATININE 2.01 (H) 01/21/2024   GLUCOSE 137 (H) 01/21/2024   GFRNONAA 35 (L) 01/21/2024   GFRAA >60 02/13/2019   CALCIUM  7.9 (L) 01/21/2024   PHOS 2.3 (L) 01/16/2024  PROT 8.4 (H) 01/16/2024   ALBUMIN 4.1 01/16/2024   BILITOT 1.1 01/16/2024   ALKPHOS 143 (H) 01/16/2024   AST 12 (L) 01/16/2024   ALT 11 01/16/2024   ANIONGAP 7 01/21/2024    GFR: Estimated Creatinine Clearance: 41.9 mL/min (A) (by C-G formula based on SCr of 2.01 mg/dL (H)).  Recent  Results (from the past 240 hours)  Culture, blood (routine x 2)     Status: None   Collection Time: 01/16/24 11:00 AM   Specimen: BLOOD  Result Value Ref Range Status   Specimen Description BLOOD BLOOD RIGHT ARM  Final   Special Requests   Final    BOTTLES DRAWN AEROBIC AND ANAEROBIC Blood Culture adequate volume   Culture   Final    NO GROWTH 5 DAYS Performed at [facility], [address], [place], [place] [address]    Report Status 01/21/2024 FINAL  Final  Culture, blood (routine x 2)     Status: None   Collection Time: 01/16/24 11:00 AM   Specimen: BLOOD  Result Value Ref Range Status   Specimen Description BLOOD BLOOD LEFT ARM  Final   Special Requests   Final    BOTTLES DRAWN AEROBIC AND ANAEROBIC Blood Culture adequate volume   Culture   Final    NO GROWTH 5 DAYS Performed at [facility], [address], [place], [place] [address]    Report Status 01/21/2024 FINAL  Final  Resp panel by RT-PCR (RSV, Flu A&B, Covid) Anterior Nasal Swab     Status: None   Collection Time: 01/16/24 11:03 AM   Specimen: Anterior Nasal Swab  Result Value Ref Range Status   SARS Coronavirus 2 by RT PCR NEGATIVE NEGATIVE Final    Comment: (NOTE) SARS-CoV-2 target nucleic acids are NOT DETECTED.  The SARS-CoV-2 RNA is generally detectable in upper respiratory specimens during the acute phase of infection. The lowest concentration of SARS-CoV-2 viral copies this assay can detect is 138 copies/mL. A negative result does not preclude SARS-Cov-2 infection and should not be used as the sole basis for treatment or other patient management decisions. A negative result may occur with  improper specimen collection/handling, submission of specimen other than nasopharyngeal swab, presence of viral mutation(s) within the areas targeted by this assay, and inadequate number of viral copies(<138 copies/mL). A negative result must be combined with clinical observations, patient history, and  epidemiological information. The expected result is Negative.  Fact Sheet for Patients:  bloggercourse.com  Fact Sheet for Healthcare Providers:  seriousbroker.it  This test is no t yet approved or cleared by the [place]  FDA and  has been authorized for detection and/or diagnosis of SARS-CoV-2 by FDA under an Emergency Use Authorization (EUA). This EUA will remain  in effect (meaning this test can be used) for the duration of the COVID-19 declaration under Section 564(b)(1) of the Act, 21 U.S.C.section 360bbb-3(b)(1), unless the authorization is terminated  or revoked sooner.       Influenza A by PCR NEGATIVE NEGATIVE Final   Influenza B by PCR NEGATIVE NEGATIVE Final    Comment: (NOTE) The Xpert Xpress SARS-CoV-2/FLU/RSV plus assay is intended as an aid in the diagnosis of influenza from Nasopharyngeal swab specimens and should not be used as a sole basis for treatment. Nasal washings and aspirates are unacceptable for Xpert Xpress SARS-CoV-2/FLU/RSV testing.  Fact Sheet for Patients: bloggercourse.com  Fact Sheet for Healthcare Providers: seriousbroker.it  This test is not yet approved or cleared by the [place]  FDA  and has been authorized for detection and/or diagnosis of SARS-CoV-2 by FDA under an Emergency Use Authorization (EUA). This EUA will remain in effect (meaning this test can be used) for the duration of the COVID-19 declaration under Section 564(b)(1) of the Act, 21 U.S.C. section 360bbb-3(b)(1), unless the authorization is terminated or revoked.     Resp Syncytial Virus by PCR NEGATIVE NEGATIVE Final    Comment: (NOTE) Fact Sheet for Patients: bloggercourse.com  Fact Sheet for Healthcare Providers: seriousbroker.it  This test is not yet approved or cleared by the [place]  FDA and has been  authorized for detection and/or diagnosis of SARS-CoV-2 by FDA under an Emergency Use Authorization (EUA). This EUA will remain in effect (meaning this test can be used) for the duration of the COVID-19 declaration under Section 564(b)(1) of the Act, 21 U.S.C. section 360bbb-3(b)(1), unless the authorization is terminated or revoked.  Performed at [facility], [address], [place], [place] [address]   Gastrointestinal Panel by PCR , Stool     Status: None   Collection Time: 01/18/24  6:49 PM   Specimen: Stool  Result Value Ref Range Status   Campylobacter species NOT DETECTED NOT DETECTED Final   Plesimonas shigelloides NOT DETECTED NOT DETECTED Final   Salmonella species NOT DETECTED NOT DETECTED Final   Yersinia enterocolitica NOT DETECTED NOT DETECTED Final   Vibrio species NOT DETECTED NOT DETECTED Final   Vibrio cholerae NOT DETECTED NOT DETECTED Final   Enteroaggregative E coli (EAEC) NOT DETECTED NOT DETECTED Final   Enteropathogenic E coli (EPEC) NOT DETECTED NOT DETECTED Final   Enterotoxigenic E coli (ETEC) NOT DETECTED NOT DETECTED Final   Shiga like toxin producing E coli (STEC) NOT DETECTED NOT DETECTED Final   Shigella/Enteroinvasive E coli (EIEC) NOT DETECTED NOT DETECTED Final   Cryptosporidium NOT DETECTED NOT DETECTED Final   Cyclospora cayetanensis NOT DETECTED NOT DETECTED Final   Entamoeba histolytica NOT DETECTED NOT DETECTED Final   Giardia lamblia NOT DETECTED NOT DETECTED Final   Adenovirus F40/41 NOT DETECTED NOT DETECTED Final   Astrovirus NOT DETECTED NOT DETECTED Final   Norovirus GI/GII NOT DETECTED NOT DETECTED Final   Rotavirus A NOT DETECTED NOT DETECTED Final   Sapovirus (I, II, IV, and V) NOT DETECTED NOT DETECTED Final    Comment: Performed at [facility], [address] [place], [place] [address]  C Difficile Quick Screen w PCR reflex     Status: None   Collection Time: 01/18/24  6:49 PM   Specimen: STOOL  Result Value Ref Range  Status   C Diff antigen NEGATIVE NEGATIVE Final   C Diff toxin NEGATIVE NEGATIVE Final   C Diff interpretation No C. difficile detected.  Final    Comment: Performed at [facility], [address] [place], [place] [address]      Radiology Studies: No results found.     LOS: 7 days    Elgin Lam, MD [facility] 01/23/2024, 8:28 AM   If 7PM-7AM, please contact night-coverage [email or web address]

## 2024-01-23 NOTE — TOC Progression Note (Addendum)
 Transition of Care [facility]) - Progression Note    Patient Details  Name: [name] MRN: [id number] Date of Birth: 08-22-1950  [facility]) CM/SW Contact  Bridget Cordella Simmonds, LCSW Phone Number: 01/23/2024, 10:05 AM  Clinical Narrative:  CSW provided bed offers to pt, he will accept offer at [facility].  CSW confirmed with [facility].    New PT note needed for insurance auth.  PT aware.    1415: SNF auth request submitted and approved: [id number], 3 days: 12/10-12/12.  1500: passr approved: [id number]  Expected Discharge Plan: Skilled Nursing Facility Barriers to Discharge: Continued Medical Work up               Expected Discharge Plan and Services In-house Referral: Clinical Social Work Discharge Planning Services: CM Consult   Living arrangements for the past 2 months: Single Family Home                                       Social Drivers of Health (SDOH) Interventions SDOH Screenings   Food Insecurity: No Food Insecurity (01/16/2024)  Housing: Low Risk  (01/16/2024)  Transportation Needs: No Transportation Needs (01/16/2024)  Utilities: Not At Risk (01/16/2024)  Financial Resource Strain: Low Risk  (12/13/2021)  Social Connections: Socially Isolated (01/16/2024)  Tobacco Use: Low Risk  (01/19/2024)    Readmission Risk Interventions    11/30/2021    2:51 PM  Readmission Risk Prevention Plan  Transportation Screening Complete  HRI or Home Care Consult Complete  Social Work Consult for Recovery Care Planning/Counseling Complete  Palliative Care Screening Not Applicable  Medication Review Oceanographer) Complete

## 2024-01-24 DIAGNOSIS — N1832 Chronic kidney disease, stage 3b: Secondary | ICD-10-CM | POA: Diagnosis not present

## 2024-01-24 DIAGNOSIS — I10 Essential (primary) hypertension: Secondary | ICD-10-CM | POA: Diagnosis not present

## 2024-01-24 DIAGNOSIS — E1121 Type 2 diabetes mellitus with diabetic nephropathy: Secondary | ICD-10-CM | POA: Diagnosis not present

## 2024-01-24 DIAGNOSIS — E440 Moderate protein-calorie malnutrition: Secondary | ICD-10-CM | POA: Insufficient documentation

## 2024-01-24 DIAGNOSIS — K219 Gastro-esophageal reflux disease without esophagitis: Secondary | ICD-10-CM | POA: Diagnosis not present

## 2024-01-24 DIAGNOSIS — A419 Sepsis, unspecified organism: Secondary | ICD-10-CM | POA: Diagnosis not present

## 2024-01-24 DIAGNOSIS — M86171 Other acute osteomyelitis, right ankle and foot: Secondary | ICD-10-CM | POA: Diagnosis not present

## 2024-01-24 LAB — GLUCOSE, CAPILLARY
Glucose-Capillary: 137 mg/dL — ABNORMAL HIGH (ref 70–99)
Glucose-Capillary: 104 mg/dL — ABNORMAL HIGH (ref 70–99)

## 2024-01-24 MED ORDER — VITAMIN B-1 100 MG PO TABS: 100.0000 mg | ORAL_TABLET | Freq: Every day | ORAL | Status: DC

## 2024-01-24 MED ORDER — INSULIN GLARGINE-YFGN 100 UNIT/ML ~~LOC~~ SOLN: 15.0000 [IU] | Freq: Every day | SUBCUTANEOUS | 11 refills | Status: DC

## 2024-01-24 MED ORDER — OXYCODONE HCL 5 MG PO TABS: 5.0000 mg | ORAL_TABLET | Freq: Four times a day (QID) | ORAL | 0 refills | Status: AC | PRN

## 2024-01-24 MED ORDER — ENSURE ENLIVE PO LIQD: 237.0000 mL | Freq: Two times a day (BID) | ORAL | 12 refills | Status: AC

## 2024-01-24 MED ORDER — METOPROLOL SUCCINATE ER 100 MG PO TB24: 100.0000 mg | ORAL_TABLET | Freq: Every day | ORAL | 2 refills | Status: DC

## 2024-01-24 MED ORDER — ONDANSETRON HCL 4 MG PO TABS: 4.0000 mg | ORAL_TABLET | Freq: Four times a day (QID) | ORAL | 0 refills | Status: AC | PRN

## 2024-01-24 MED ORDER — SENNOSIDES-DOCUSATE SODIUM 8.6-50 MG PO TABS: 2.0000 | ORAL_TABLET | Freq: Every evening | ORAL | 0 refills | Status: DC | PRN

## 2024-01-24 MED ORDER — INSULIN ASPART 100 UNIT/ML IJ SOLN: INTRAMUSCULAR | Status: DC

## 2024-01-24 MED ORDER — BUPIVACAINE-EPINEPHRINE (PF) 0.5% -1:200000 IJ SOLN
INTRAMUSCULAR | Status: DC | PRN
  Administered 2024-01-19: 25 mL via PERINEURAL

## 2024-01-24 MED ORDER — ADULT MULTIVITAMIN W/MINERALS CH: 1.0000 | ORAL_TABLET | Freq: Every day | ORAL | Status: DC

## 2024-01-24 NOTE — Progress Notes (Signed)
 Report given to [facility], LPN, with [facility].  All questions answered.  No concerns voiced.

## 2024-01-24 NOTE — Anesthesia Postprocedure Evaluation (Signed)
 Anesthesia Post Note  Patient: [name]  Procedure(s) Performed: AMPUTATION, FOOT, RAY (Right)     Patient location during evaluation: PACU Anesthesia Type: MAC and Regional Level of consciousness: awake and patient cooperative Pain management: pain level controlled Vital Signs Assessment: post-procedure vital signs reviewed and stable Respiratory status: spontaneous breathing, nonlabored ventilation and respiratory function stable Cardiovascular status: stable and blood pressure returned to baseline Postop Assessment: no apparent nausea or vomiting Anesthetic complications: no   There were no known notable events for this encounter.                Octa Uplinger

## 2024-01-24 NOTE — Discharge Summary (Signed)
 Physician Discharge Summary   Patient: [name] MRN: [id number] DOB: 18-May-1950  Admit date:     01/16/2024  Discharge date: 01/24/24  Discharge Physician: Elgie Butter   PCP: Marvine Rush, MD   Recommendations at discharge:  Please follow up with Dr Harden in one week after discharge.  Please follow up with PCP in 1 to 2 weeks.  Please check cbc and bmp  in one week.   Discharge Diagnoses: Principal Problem:   Sepsis (HCC) Active Problems:   Hypertension   Gout   GERD (gastroesophageal reflux disease)   Type 2 diabetes with nephropathy (HCC)   Chronic kidney disease, stage III (moderate) (HCC)   Osteomyelitis of foot, right, acute (HCC)   Depression with anxiety   Malnutrition of moderate degree  Resolved Problems:   * No resolved hospital problems. *  Hospital Course: [name] is a 73 y.o. male with a history of hyperlipidemia, CKD stage IIIa, hypertension, SVT, gout, diabetes mellitus type 2, osteomyelitis, GERD.  Patient presented secondary to emesis and found to have concern for sepsis on presentation secondary to right foot osteomyelitis. Empiric antibiotics started and orthopedic surgery consulted and plan surgical management.  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 Orthopedic surgery consulted and performed a first ray amputation on 12/3. Per orthopedic surgery, patient to minimize weightbearing to the right extremity and recommend office follow-up in 1 week.  PT/OT recommending SNF.   Sepsis Present on admission. Secondary to osteomyelitis. Blood cultures obtained on admission are no growth to date.   Diarrhea Patient with a one week history of diarrhea. No associated abdominal pain. No nausea or vomiting. C. Difficile and GI pathogen panel testing is negative. Symptoms are improved.   Diabetes mellitus type 2 Poorly controlled with hyperglycemia based on  hemoglobin A1C of 9.8%. -Continue insulin  glargine and SSI, along with home glimepiride.    Primary hypertension -Continue Toprol  XL   GERD -Continue Protonix    Gout -Continue feboxostat   AKI on CKD stage IIIb Baseline creatinine appears to be around 1.8. Creatinine worsened to 2.42. Unclear but likely related to fluid loss from diarrhea. Improved to 2.01 and is closer to baseline.  recommend checking labs in one week.   Hypokalemia Resolved with potassium supplementation.   Depression Anxiety Continue Wellbutrin  XL and sertraline .          Consultants: orthopedics.  Procedures performed: first ray amputation  Disposition: Skilled nursing facility Diet recommendation:  Discharge Diet Orders (From admission, onward)     Start     Ordered   01/24/24 0000  Diet - low sodium heart healthy        01/24/24 1110           Carb modified diet DISCHARGE MEDICATION: Allergies as of 01/24/2024       Reactions   Ivp Dye [iodinated Contrast Media] Other (See Comments)   Speed heart rate up    Penicillins Rash   Pt tolerated amoxicillin  oral challenge   Tetanus Toxoid-containing Vaccines Rash        Medication List     STOP taking these medications    Toujeo  Max SoloStar 300 UNIT/ML Solostar Pen Generic drug: insulin  glargine (2 Unit Dial)       TAKE these medications    Accu-Chek Guide test strip Generic drug: glucose blood USE AS DIRECTED TO TEST TWICE DAILY   acetaminophen  500 MG tablet Commonly known as: TYLENOL   Take 2 tablets (1,000 mg total) by mouth every 8 (eight) hours as needed for mild pain (pain score 1-3) or moderate pain (pain score 4-6). Pain   atorvastatin  10 MG tablet Commonly known as: LIPITOR Take 10 mg by mouth at bedtime.   B-complex with vitamin C tablet Take 1 tablet by mouth daily.   buPROPion  150 MG 24 hr tablet Commonly known as: WELLBUTRIN  XL Take 150 mg by mouth at bedtime.   feeding supplement Liqd Take 237 mLs  by mouth 2 (two) times daily between meals.   glimepiride 4 MG tablet Commonly known as: AMARYL Take 4 mg by mouth in the morning and at bedtime.   hydrALAZINE  25 MG tablet Commonly known as: APRESOLINE  TAKE 1 TABLET BY MOUTH IN THE MORNING AND AT BEDTIME. MAY TAKE ADDITIONAL AS NEEDED FOR SYSTOLIC BLOOD PRESSURE CONSISTENTLY ABOVE 140. What changed: See the new instructions.   insulin  aspart 100 UNIT/ML injection Commonly known as: novoLOG  CBG 70 - 120: 0 units  CBG 121 - 150: 2 units  CBG 151 - 200: 3 units  CBG 201 - 250: 5 units  CBG 251 - 300: 8 units  CBG 301 - 350: 11 units  CBG 351 - 400: 15 units   insulin  glargine-yfgn 100 UNIT/ML injection Commonly known as: SEMGLEE  Inject 0.15 mLs (15 Units total) into the skin at bedtime.   metoprolol  tartrate 100 MG tablet Commonly known as: LOPRESSOR  Take 100 mg by mouth daily.   multivitamin with minerals Tabs tablet Take 1 tablet by mouth daily. Start taking on: January 25, 2024   omeprazole  20 MG capsule Commonly known as: PRILOSEC TAKE ONE CAPSULE BY MOUTH DAILY. NEEDS OFFICE VISIT FOR FURTHER REFILLS. What changed:  how much to take how to take this when to take this   ondansetron  4 MG tablet Commonly known as: ZOFRAN  Take 1 tablet (4 mg total) by mouth every 6 (six) hours as needed for nausea.   oxyCODONE  5 MG immediate release tablet Commonly known as: Oxy IR/ROXICODONE  Take 1 tablet (5 mg total) by mouth every 6 (six) hours as needed for up to 3 days for moderate pain (pain score 4-6).   senna-docusate 8.6-50 MG tablet Commonly known as: Senokot-S Take 2 tablets by mouth at bedtime as needed for mild constipation.   sertraline  100 MG tablet Commonly known as: ZOLOFT  Take 100 mg by mouth at bedtime.   sodium bicarbonate  650 MG tablet Take 650 mg by mouth 2 (two) times daily.   thiamine  100 MG tablet Commonly known as: Vitamin B-1 Take 1 tablet (100 mg total) by mouth daily. Start taking on:  January 25, 2024   Uloric  80 MG Tabs Generic drug: Febuxostat  Take 80 mg by mouth daily.   Vitamin D  50 MCG (2000 UT) Caps Take 2,000 Units by mouth daily.               Discharge Care Instructions  (From admission, onward)           Start     Ordered   01/24/24 0000  Discharge wound care:       Comments: As per orthopedics.   01/24/24 1110   01/20/24 0000  Touch down weight bearing       Question Answer Comment  Laterality right   Extremity Lower      01/20/24 1036            Follow-up Information     Harden Jerona GAILS, MD Follow up in 1  week(s).   Specialty: Orthopedic Surgery Contact information: [address] [place] [address] [phone]                Discharge Exam: Fredricka Weights   01/16/24 1034 01/19/24 0921  Weight: 103 kg (P) 103 kg   General exam: Appears calm and comfortable  Respiratory system: Clear to auscultation. Respiratory effort normal. Cardiovascular system: S1 & S2 heard, RRR. Gastrointestinal system: Abdomen is nondistended, soft and nontender. Central nervous system: Alert and oriented.  Extremities: right foot bandaged.  Skin: No rashes, Psychiatry: Mood & affect appropriate.    Condition at discharge: fair  The results of significant diagnostics from this hospitalization (including imaging, microbiology, ancillary and laboratory) are listed below for reference.   Imaging Studies: MR FOOT RIGHT WO CONTRAST Result Date: 01/16/2024 EXAM: MRI of the right Foot without contrast. 01/16/2024 02:09:27 PM TECHNIQUE: Multiplanar multisequence MRI of the right foot was performed without the administration of intravenous contrast. COMPARISON: Foot radiographs 01/16/2024. CLINICAL HISTORY: Right foot pain, possible osteomyelitis, history of amputations. FINDINGS: LISFRANC JOINT: Visualized Lisfranc ligament is intact. No significant Lisfranc interval widening or significant periligamentous edema. BONE MARROW: Prior  transmetatarsal amputation. Scalloped/eroded distal margin of the 1st metatarsal with mild but increased marrow edema signal in the underlying shaft and proximal metaphysis of the 1st metatarsal compared to 05/03/2023, suggesting chronic osteomyelitis. Low level marrow edema in the mediocuneiform and in the navicular, probably related to arthropathy but technically nonspecific. Plantar calcaneal spur. SOFT TISSUES: Cutaneous and subcutaneous ulceration along the plantar-medial side of the amputation. Highly irregular and indistinct plantar fascia, as well as new edema and infiltration in the previously fatty flexor digitorum brevis and abductor digiti minimi muscles raising the possibility of plantar fascia rupture with surrounding edema. The remaining musculature of the foot is atrophic. TENDONS: Distal Achilles tendinopathy with thickening of the Achilles tendon and spurring from the calcaneus extending in the superficial distal margin of the Achilles tendon. Tibialis posterior and flexor digitorum longus tenosynovitis. OTHER ARTICULATIONS: Small dorsal effusion of the talonavicular articulation. Small lateral effusion of the calcaneocuboid joint and the articulation between the cuboid and 5th metatarsal base. IMPRESSION: 1. 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 2. Possible plantar fascia rupture with surrounding fluid/edema involving the otherwise atrophic flexor digitorum brevis and abductor digiti minimi muscles. 3. Small effusions in the talonavicular, calcaneocuboid, and cuboidfifth metatarsal base articulations. 4. Distal Achilles tendinopathy with spurring and superficial extension. 5. Tibialis posterior and flexor digitorum longus tenosynovitis. Electronically signed by: Ryan Salvage MD 01/16/2024 02:34 PM EST RP Workstation: [id number]   DG Foot Complete Right Result Date: 01/16/2024 CLINICAL DATA:  Right foot  plantar ulcer with associated erythema. EXAM: RIGHT FOOT COMPLETE - 3+ VIEW COMPARISON:  03/01/2023 and right foot MRI dated 05/03/2023 FINDINGS: Diffuse soft tissue swelling with an interval decrease in size of the air containing ulcer in the plantar aspect of the distal foot. No separate soft tissue gas and no interval bone destruction and periosteal reaction. Again demonstrated are post amputation changes of all 5 digits at the level of the distal metatarsals. The underlying bone margins are sharp and defined on the PA and oblique images. On the lateral image, one of the bone margins is indistinct ventrally with improvement. IMPRESSION: 1. Interval decrease in size of the air containing ulcer in the plantar aspect of the distal foot. 2. Continued indistinct margins of one of the distal metatarsals ventrally at the amputation site. Although  this has improved since 03/01/2023, osteomyelitis can not be excluded at this location. This could be best evaluated with pre and postcontrast MRI of the foot. Electronically Signed   By: Elspeth Bathe M.D.   On: 01/16/2024 11:16   DG Chest Port 1 View Result Date: 01/16/2024 CLINICAL DATA:  Generalized weakness and vomiting. EXAM: PORTABLE CHEST 1 VIEW COMPARISON:  05/09/2023 FINDINGS: Normal-sized heart. Clear lungs with normal vascularity. Lower thoracic spine degenerative changes. IMPRESSION: No acute abnormality. Electronically Signed   By: Elspeth Bathe M.D.   On: 01/16/2024 11:09    Microbiology: Results for orders placed or performed during the hospital encounter of 01/16/24  Culture, blood (routine x 2)     Status: None   Collection Time: 01/16/24 11:00 AM   Specimen: BLOOD  Result Value Ref Range Status   Specimen Description BLOOD BLOOD RIGHT ARM  Final   Special Requests   Final    BOTTLES DRAWN AEROBIC AND ANAEROBIC Blood Culture adequate volume   Culture   Final    NO GROWTH 5 DAYS Performed at [name], [address], [place], [place]  [address]    Report Status 01/21/2024 FINAL  Final  Culture, blood (routine x 2)     Status: None   Collection Time: 01/16/24 11:00 AM   Specimen: BLOOD  Result Value Ref Range Status   Specimen Description BLOOD BLOOD LEFT ARM  Final   Special Requests   Final    BOTTLES DRAWN AEROBIC AND ANAEROBIC Blood Culture adequate volume   Culture   Final    NO GROWTH 5 DAYS Performed at [facility], [address] [place], [place] [address]    Report Status 01/21/2024 FINAL  Final  Resp panel by RT-PCR (RSV, Flu A&B, Covid) Anterior Nasal Swab     Status: None   Collection Time: 01/16/24 11:03 AM   Specimen: Anterior Nasal Swab  Result Value Ref Range Status   SARS Coronavirus 2 by RT PCR NEGATIVE NEGATIVE Final    Comment: (NOTE) SARS-CoV-2 target nucleic acids are NOT DETECTED.  The SARS-CoV-2 RNA is generally detectable in upper respiratory specimens during the acute phase of infection. The lowest concentration of SARS-CoV-2 viral copies this assay can detect is 138 copies/mL. A negative result does not preclude SARS-Cov-2 infection and should not be used as the sole basis for treatment or other patient management decisions. A negative result may occur with  improper specimen collection/handling, submission of specimen other than nasopharyngeal swab, presence of viral mutation(s) within the areas targeted by this assay, and inadequate number of viral copies(<138 copies/mL). A negative result must be combined with clinical observations, patient history, and epidemiological information. The expected result is Negative.  Fact Sheet for Patients:  bloggercourse.com  Fact Sheet for Healthcare Providers:  seriousbroker.it  This test is no t yet approved or cleared by the [place]  FDA and  has been authorized for detection and/or diagnosis of SARS-CoV-2 by FDA under an Emergency Use Authorization (EUA). This EUA will remain  in  effect (meaning this test can be used) for the duration of the COVID-19 declaration under Section 564(b)(1) of the Act, 21 U.S.C.section 360bbb-3(b)(1), unless the authorization is terminated  or revoked sooner.       Influenza A by PCR NEGATIVE NEGATIVE Final   Influenza B by PCR NEGATIVE NEGATIVE Final    Comment: (NOTE) The Xpert Xpress SARS-CoV-2/FLU/RSV plus assay is intended as an aid in the diagnosis of influenza from Nasopharyngeal swab specimens and  should not be used as a sole basis for treatment. Nasal washings and aspirates are unacceptable for Xpert Xpress SARS-CoV-2/FLU/RSV testing.  Fact Sheet for Patients: bloggercourse.com  Fact Sheet for Healthcare Providers: seriousbroker.it  This test is not yet approved or cleared by the [place]  FDA and has been authorized for detection and/or diagnosis of SARS-CoV-2 by FDA under an Emergency Use Authorization (EUA). This EUA will remain in effect (meaning this test can be used) for the duration of the COVID-19 declaration under Section 564(b)(1) of the Act, 21 U.S.C. section 360bbb-3(b)(1), unless the authorization is terminated or revoked.     Resp Syncytial Virus by PCR NEGATIVE NEGATIVE Final    Comment: (NOTE) Fact Sheet for Patients: bloggercourse.com  Fact Sheet for Healthcare Providers: seriousbroker.it  This test is not yet approved or cleared by the [place]  FDA and has been authorized for detection and/or diagnosis of SARS-CoV-2 by FDA under an Emergency Use Authorization (EUA). This EUA will remain in effect (meaning this test can be used) for the duration of the COVID-19 declaration under Section 564(b)(1) of the Act, 21 U.S.C. section 360bbb-3(b)(1), unless the authorization is terminated or revoked.  Performed at [facility], [address], [place], [place] [address]   Gastrointestinal Panel  by PCR , Stool     Status: None   Collection Time: 01/18/24  6:49 PM   Specimen: Stool  Result Value Ref Range Status   Campylobacter species NOT DETECTED NOT DETECTED Final   Plesimonas shigelloides NOT DETECTED NOT DETECTED Final   Salmonella species NOT DETECTED NOT DETECTED Final   Yersinia enterocolitica NOT DETECTED NOT DETECTED Final   Vibrio species NOT DETECTED NOT DETECTED Final   Vibrio cholerae NOT DETECTED NOT DETECTED Final   Enteroaggregative E coli (EAEC) NOT DETECTED NOT DETECTED Final   Enteropathogenic E coli (EPEC) NOT DETECTED NOT DETECTED Final   Enterotoxigenic E coli (ETEC) NOT DETECTED NOT DETECTED Final   Shiga like toxin producing E coli (STEC) NOT DETECTED NOT DETECTED Final   Shigella/Enteroinvasive E coli (EIEC) NOT DETECTED NOT DETECTED Final   Cryptosporidium NOT DETECTED NOT DETECTED Final   Cyclospora cayetanensis NOT DETECTED NOT DETECTED Final   Entamoeba histolytica NOT DETECTED NOT DETECTED Final   Giardia lamblia NOT DETECTED NOT DETECTED Final   Adenovirus F40/41 NOT DETECTED NOT DETECTED Final   Astrovirus NOT DETECTED NOT DETECTED Final   Norovirus GI/GII NOT DETECTED NOT DETECTED Final   Rotavirus A NOT DETECTED NOT DETECTED Final   Sapovirus (I, II, IV, and V) NOT DETECTED NOT DETECTED Final    Comment: Performed at [facility], [address] [place], [place] [address]  C Difficile Quick Screen w PCR reflex     Status: None   Collection Time: 01/18/24  6:49 PM   Specimen: STOOL  Result Value Ref Range Status   C Diff antigen NEGATIVE NEGATIVE Final   C Diff toxin NEGATIVE NEGATIVE Final   C Diff interpretation No C. difficile detected.  Final    Comment: Performed at [facility], [address], [place], [place] [address]    Labs: CBC: Recent Labs  Lab 01/18/24 1042 01/19/24 0501 01/20/24 0659 01/21/24 0754  WBC 16.9* 12.9* 13.2* 11.8*  NEUTROABS 15.2*  --   --   --   HGB 9.8* 9.6* 9.6* 9.7*  HCT 29.8* 29.2*  28.4* 29.1*  MCV 81.2 80.4 79.8* 79.7*  PLT 226 242 250 266   Basic Metabolic Panel: Recent Labs  Lab  01/18/24 1042 01/19/24 0501 01/20/24 0659 01/21/24 0754  NA 135 136 134* 136  K 3.2* 3.2* 3.2* 3.5  CL 103 101 103 105  CO2 23 23 22 24   GLUCOSE 147* 138* 175* 137*  BUN 23 33* 35* 33*  CREATININE 2.42* 2.35* 2.28* 2.01*  CALCIUM  8.0* 7.9* 7.4* 7.9*  MG  --   --  1.8  --    Liver Function Tests: No results for input(s): AST, ALT, ALKPHOS, BILITOT, PROT, ALBUMIN in the last 168 hours. CBG: Recent Labs  Lab 01/23/24 0636 01/23/24 1127 01/23/24 1554 01/23/24 2126 01/24/24 0647  GLUCAP 155* 152* 160* 162* 137*    Discharge time spent: 34 minutes.   Signed: Elgie Butter, MD [facility] 01/24/2024

## 2024-01-24 NOTE — Plan of Care (Signed)
 Problem: Education: Goal: Ability to describe self-care measures that may prevent or decrease complications (Diabetes Survival Skills Education) will improve 01/24/2024 1139 by German Adrien BRAVO, RN Outcome: Completed/Met 01/24/2024 1138 by German Adrien BRAVO, RN Outcome: Progressing Goal: Individualized Educational Video(s) 01/24/2024 1139 by German Adrien BRAVO, RN Outcome: Completed/Met 01/24/2024 1138 by German Adrien BRAVO, RN Outcome: Progressing   Problem: Coping: Goal: Ability to adjust to condition or change in health will improve 01/24/2024 1139 by German Adrien BRAVO, RN Outcome: Completed/Met 01/24/2024 1138 by German Adrien BRAVO, RN Outcome: Progressing   Problem: Fluid Volume: Goal: Ability to maintain a balanced intake and output will improve 01/24/2024 1139 by German Adrien BRAVO, RN Outcome: Completed/Met 01/24/2024 1138 by German Adrien BRAVO, RN Outcome: Progressing   Problem: Health Behavior/Discharge Planning: Goal: Ability to identify and utilize available resources and services will improve 01/24/2024 1139 by German Adrien BRAVO, RN Outcome: Completed/Met 01/24/2024 1138 by German Adrien BRAVO, RN Outcome: Progressing Goal: Ability to manage health-related needs will improve 01/24/2024 1139 by German Adrien BRAVO, RN Outcome: Completed/Met 01/24/2024 1138 by German Adrien BRAVO, RN Outcome: Progressing   Problem: Metabolic: Goal: Ability to maintain appropriate glucose levels will improve 01/24/2024 1139 by German Adrien BRAVO, RN Outcome: Completed/Met 01/24/2024 1138 by German Adrien BRAVO, RN Outcome: Progressing   Problem: Nutritional: Goal: Maintenance of adequate nutrition will improve 01/24/2024 1139 by German Adrien BRAVO, RN Outcome: Completed/Met 01/24/2024 1138 by German Adrien BRAVO, RN Outcome: Progressing Goal: Progress toward achieving an optimal weight will improve 01/24/2024 1139 by German Adrien BRAVO, RN Outcome: Completed/Met 01/24/2024 1138 by German Adrien BRAVO, RN Outcome:  Progressing   Problem: Skin Integrity: Goal: Risk for impaired skin integrity will decrease 01/24/2024 1139 by German Adrien BRAVO, RN Outcome: Completed/Met 01/24/2024 1138 by German Adrien BRAVO, RN Outcome: Progressing   Problem: Tissue Perfusion: Goal: Adequacy of tissue perfusion will improve 01/24/2024 1139 by German Adrien BRAVO, RN Outcome: Completed/Met 01/24/2024 1138 by German Adrien BRAVO, RN Outcome: Progressing   Problem: Education: Goal: Knowledge of General Education information will improve Description: Including pain rating scale, medication(s)/side effects and non-pharmacologic comfort measures 01/24/2024 1139 by German Adrien BRAVO, RN Outcome: Completed/Met 01/24/2024 1138 by German Adrien BRAVO, RN Outcome: Progressing   Problem: Health Behavior/Discharge Planning: Goal: Ability to manage health-related needs will improve 01/24/2024 1139 by German Adrien BRAVO, RN Outcome: Completed/Met 01/24/2024 1138 by German Adrien BRAVO, RN Outcome: Progressing   Problem: Clinical Measurements: Goal: Ability to maintain clinical measurements within normal limits will improve 01/24/2024 1139 by German Adrien BRAVO, RN Outcome: Completed/Met 01/24/2024 1138 by German Adrien BRAVO, RN Outcome: Progressing Goal: Will remain free from infection 01/24/2024 1139 by German Adrien BRAVO, RN Outcome: Completed/Met 01/24/2024 1138 by German Adrien BRAVO, RN Outcome: Progressing Goal: Diagnostic test results will improve 01/24/2024 1139 by German Adrien BRAVO, RN Outcome: Completed/Met 01/24/2024 1138 by German Adrien BRAVO, RN Outcome: Progressing Goal: Respiratory complications will improve 01/24/2024 1139 by German Adrien BRAVO, RN Outcome: Completed/Met 01/24/2024 1138 by German Adrien BRAVO, RN Outcome: Progressing Goal: Cardiovascular complication will be avoided 01/24/2024 1139 by German Adrien BRAVO, RN Outcome: Completed/Met 01/24/2024 1138 by German Adrien BRAVO, RN Outcome: Progressing   Problem: Activity: Goal: Risk  for activity intolerance will decrease 01/24/2024 1139 by German Adrien BRAVO, RN Outcome: Completed/Met 01/24/2024 1138 by German Adrien BRAVO, RN Outcome: Progressing   Problem: Nutrition: Goal: Adequate nutrition will be maintained 01/24/2024 1139 by German Adrien BRAVO, RN Outcome: Completed/Met 01/24/2024 1138 by German Adrien BRAVO, RN Outcome: Progressing  Problem: Coping: Goal: Level of anxiety will decrease 01/24/2024 1139 by German Adrien BRAVO, RN Outcome: Completed/Met 01/24/2024 1138 by German Adrien BRAVO, RN Outcome: Progressing   Problem: Elimination: Goal: Will not experience complications related to bowel motility 01/24/2024 1139 by German Adrien BRAVO, RN Outcome: Completed/Met 01/24/2024 1138 by German Adrien BRAVO, RN Outcome: Progressing Goal: Will not experience complications related to urinary retention 01/24/2024 1139 by German Adrien BRAVO, RN Outcome: Completed/Met 01/24/2024 1138 by German Adrien BRAVO, RN Outcome: Progressing   Problem: Pain Managment: Goal: General experience of comfort will improve and/or be controlled 01/24/2024 1139 by German Adrien BRAVO, RN Outcome: Completed/Met 01/24/2024 1138 by German Adrien BRAVO, RN Outcome: Progressing   Problem: Safety: Goal: Ability to remain free from injury will improve 01/24/2024 1139 by German Adrien BRAVO, RN Outcome: Completed/Met 01/24/2024 1138 by German Adrien BRAVO, RN Outcome: Progressing   Problem: Skin Integrity: Goal: Risk for impaired skin integrity will decrease 01/24/2024 1139 by German Adrien BRAVO, RN Outcome: Completed/Met 01/24/2024 1138 by German Adrien BRAVO, RN Outcome: Progressing

## 2024-01-24 NOTE — TOC Transition Note (Signed)
 Transition of Care [facility]) - Discharge Note   Patient Details  Name: [name] MRN: [id number] Date of Birth: [birth year]  Transition of Care [facility]) CM/SW Contact:  Bridget Cordella Simmonds, LCSW Phone Number: 01/24/2024, 11:55 AM   Clinical Narrative:   Pt discharging to [facility], room 207-2. RN call report to [phone].   PTAR called [phone].   Final next level of care: Skilled Nursing Facility Barriers to Discharge: Barriers Resolved   Patient Goals and CMS Choice Patient states their goals for this hospitalization and ongoing recovery are:: SNF CMS Medicare.gov Compare Post Acute Care list provided to:: Patient Choice offered to / list presented to : Patient      Discharge Placement              Patient chooses bed at:  [facility] rehab) Patient to be transferred to facility by: ptar Name of family member notified: relative: Mary Patient and family notified of of transfer: 01/24/24  Discharge Plan and Services Additional resources added to the After Visit Summary for   In-house Referral: Clinical Social Work Discharge Planning Services: CM Consult                                 Social Drivers of Health (SDOH) Interventions SDOH Screenings   Food Insecurity: No Food Insecurity (01/16/2024)  Housing: Low Risk  (01/16/2024)  Transportation Needs: No Transportation Needs (01/16/2024)  Utilities: Not At Risk (01/16/2024)  Financial Resource Strain: Low Risk  (12/13/2021)  Social Connections: Socially Isolated (01/16/2024)  Tobacco Use: Low Risk  (01/19/2024)     Readmission Risk Interventions    11/30/2021    2:51 PM  Readmission Risk Prevention Plan  Transportation Screening Complete  HRI or Home Care Consult Complete  Social Work Consult for Recovery Care Planning/Counseling Complete  Palliative Care Screening Not Applicable  Medication Review Oceanographer) Complete

## 2024-01-24 NOTE — TOC Progression Note (Addendum)
 Transition of Care [facility]) - Progression Note    Patient Details  Name: [name] MRN: [id number] Date of Birth: [birth year]  Transition of Care [facility]) CM/SW Contact  Bridget Cordella Simmonds, LCSW Phone Number: 01/24/2024, 12:29 PM  Clinical Narrative:   CSW confirmed with [facility]: they can receive pt today.  1220: some question as to whether pt has legal guardian, suspect this is incorrect.  Pt is not able to clarify this but states [name] takes care of his finances.  CSW has notified [name] of pt DC today, she called back with several questions.  CSW attempting to clarify with Marvin if she is legal guardian.  She is runner, broadcasting/film/video and in class currently.    1400: Message from [facility].  She is not legal guardian or POA.  She and pt discussed her being POA in the past but pt declined to do so.    Expected Discharge Plan: Skilled Nursing Facility Barriers to Discharge: Barriers Resolved               Expected Discharge Plan and Services In-house Referral: Clinical Social Work Discharge Planning Services: CM Consult   Living arrangements for the past 2 months: Single Family Home Expected Discharge Date: 01/24/24                                     Social Drivers of Health (SDOH) Interventions SDOH Screenings   Food Insecurity: No Food Insecurity (01/16/2024)  Housing: Low Risk  (01/16/2024)  Transportation Needs: No Transportation Needs (01/16/2024)  Utilities: Not At Risk (01/16/2024)  Financial Resource Strain: Low Risk  (12/13/2021)  Social Connections: Socially Isolated (01/16/2024)  Tobacco Use: Low Risk  (01/19/2024)    Readmission Risk Interventions    11/30/2021    2:51 PM  Readmission Risk Prevention Plan  Transportation Screening Complete  HRI or Home Care Consult Complete  Social Work Consult for Recovery Care Planning/Counseling Complete  Palliative Care Screening Not Applicable  Medication Review Oceanographer) Complete

## 2024-01-24 NOTE — Anesthesia Preprocedure Evaluation (Signed)
 Anesthesia Evaluation  Patient identified by MRN, date of birth, ID band  Reviewed: Allergy & Precautions, NPO status , Patient's Chart, lab work & pertinent test results  History of Anesthesia Complications Negative for: history of anesthetic complications  Airway Mallampati: III  TM Distance: >3 FB Neck ROM: Full    Dental  (+) Dental Advisory Given   Pulmonary neg shortness of breath, neg COPD, neg recent URI   breath sounds clear to auscultation       Cardiovascular hypertension, + Peripheral Vascular Disease   Rhythm:Regular     Neuro/Psych  PSYCHIATRIC DISORDERS Anxiety Depression    CVA    GI/Hepatic ,GERD  ,,  Endo/Other  diabetes    Renal/GU Renal disease     Musculoskeletal  (+) Arthritis ,    Abdominal   Peds  Hematology  (+) Blood dyscrasia, anemia   Anesthesia Other Findings   Reproductive/Obstetrics                              Anesthesia Physical Anesthesia Plan  ASA: 3  Anesthesia Plan: MAC and Regional   Post-op Pain Management: Regional block*   Induction:   PONV Risk Score and Plan: 1 and Propofol  infusion and Treatment may vary due to age or medical condition  Airway Management Planned: Nasal Cannula, Natural Airway and Simple Face Mask  Additional Equipment: None  Intra-op Plan:   Post-operative Plan:   Informed Consent: I have reviewed the patients History and Physical, chart, labs and discussed the procedure including the risks, benefits and alternatives for the proposed anesthesia with the patient or authorized representative who has indicated his/her understanding and acceptance.     Dental advisory given  Plan Discussed with: CRNA  Anesthesia Plan Comments:         Anesthesia Quick Evaluation

## 2024-01-24 NOTE — Anesthesia Procedure Notes (Signed)
 Anesthesia Regional Block: Popliteal block   Pre-Anesthetic Checklist: , timeout performed,  Correct Patient, Correct Site, Correct Laterality,  Correct Procedure, Correct Position, site marked,  Risks and benefits discussed,  Surgical consent,  Pre-op evaluation,  At surgeon's request and post-op pain management  Laterality: Right and Lower  Prep: chloraprep       Needles:  Injection technique: Single-shot      Needle Length: 9cm  Needle Gauge: 22     Additional Needles: Arrow StimuQuik ECHO Echogenic Stimulating PNB Needle  Procedures:,,,, ultrasound used (permanent image in chart),,    Narrative:  Start time: 01/19/2024 9:38 AM End time: 01/19/2024 9:54 AM Injection made incrementally with aspirations every 5 mL.  Performed by: Personally  Anesthesiologist: Leopoldo Bruckner, MD

## 2024-01-24 NOTE — Plan of Care (Signed)

## 2024-01-25 ENCOUNTER — Telehealth: Payer: Self-pay

## 2024-01-25 NOTE — Telephone Encounter (Signed)
 Error

## 2024-01-25 NOTE — Telephone Encounter (Signed)
 [name]  with [facility] concerning wound care orders for patient.  Patient had right foot amputation on 01/19/2024.  CB# [phone] [phone] for [name]  or ext. [phone] for [name].  Please advise.  Thank you.

## 2024-01-26 NOTE — Telephone Encounter (Signed)
 I am not sure if this pt had a wound vac or not but if he has on a surgical dressing he can leave intact until post op appt on 02/01/2024 as long as it is not soiled.

## 2024-01-31 NOTE — Telephone Encounter (Signed)
Pt has an appt tomorrow with Dr. Lajoyce Corners

## 2024-02-01 ENCOUNTER — Ambulatory Visit: Admitting: Orthopedic Surgery

## 2024-02-01 DIAGNOSIS — M86271 Subacute osteomyelitis, right ankle and foot: Secondary | ICD-10-CM

## 2024-02-01 DIAGNOSIS — Z89431 Acquired absence of right foot: Secondary | ICD-10-CM

## 2024-02-01 DIAGNOSIS — L02611 Cutaneous abscess of right foot: Secondary | ICD-10-CM

## 2024-02-02 ENCOUNTER — Encounter: Payer: Self-pay | Admitting: Orthopedic Surgery

## 2024-02-02 NOTE — Progress Notes (Signed)
 "  Office Visit Note   Patient: [name]           Date of Birth: [birth year]           MRN: [id number] Visit Date: 02/01/2024              Requested by: Marvine Rush, MD [address] [place],  [place] [address] PCP: Marvine Rush, MD  Chief Complaint  Patient presents with   Right Foot - Routine Post Op    01/19/2024 right 1st ray amp       HPI: Discussed the use of AI scribe software for clinical note transcription with the patient, who gave verbal consent to proceed.  History of Present Illness He is a 73 year old male who presents for follow-up after a transmetatarsal amputation.  He is two weeks post-operative from a first ray revision of a transmetatarsal amputation. He is currently undergoing dry dressing changes at a skilled nursing facility. No signs of infection or complications have been noted during this period.  He is residing in a skilled nursing facility where he receives daily care and monitoring. His current living arrangement is temporary and contingent upon his recovery progress and the recommendations of his therapy team.     Assessment & Plan: Visit Diagnoses: No diagnosis found.  Plan: Assessment and Plan Assessment & Plan Status post first ray revision of transmetatarsal amputation Two weeks post-operative with well-healed incision, no infection, good vascular status. - Remove sutures today. - Continue daily dry dressing changes at skilled nursing facility. - Discharge from skilled nursing when therapy deems it safe.  Post-amputation revision follow-up Requires ongoing monitoring for proper healing and recovery. - Schedule follow-up appointment in four weeks.      Follow-Up Instructions: No follow-ups on file.   Ortho Exam  Patient is alert, oriented, no adenopathy, well-dressed, normal affect, normal respiratory effort. Physical Exam CARDIOVASCULAR: Palpable dorsalis pedis pulse. EXTREMITIES: Right first ray amputation incision  well healed, no redness, no cellulitis, no drainage.      Imaging: No results found. No images are attached to the encounter.  Labs: Lab Results  Component Value Date   HGBA1C 9.8 (H) 01/17/2024   HGBA1C 8.1 (H) 05/03/2023   HGBA1C 10.7 (H) 03/01/2023   ESRSEDRATE 78 (H) 01/16/2024   ESRSEDRATE 16 03/02/2023   ESRSEDRATE 107 (H) 11/30/2021   CRP 17.9 (H) 01/16/2024   CRP <0.5 03/02/2023   CRP 22.9 (H) 11/30/2021   LABURIC 6.9 01/09/2019   REPTSTATUS 01/21/2024 FINAL 01/16/2024   REPTSTATUS 01/21/2024 FINAL 01/16/2024   GRAMSTAIN  12/02/2021    ABUNDANT WBC PRESENT,BOTH PMN AND MONONUCLEAR ABUNDANT GRAM NEGATIVE RODS FEW GRAM POSITIVE COCCI IN PAIRS IN CHAINS    CULT  01/16/2024    NO GROWTH 5 DAYS Performed at [facility], [address], [place], [place] [address]    CULT  01/16/2024    NO GROWTH 5 DAYS Performed at [facility], [address] [place], [place] [address]    [facility] ENTEROCOCCUS FAECALIS 12/02/2021   LABORGA STREPTOCOCCUS MITIS/ORALIS 12/02/2021   LABORGA PROTEUS PENNERI 12/02/2021     Lab Results  Component Value Date   ALBUMIN 4.1 01/16/2024   ALBUMIN 3.6 06/03/2023   ALBUMIN 3.2 (L) 05/05/2023    Lab Results  Component Value Date   MG 1.8 01/20/2024   MG 2.0 01/16/2024   MG 2.0 05/05/2023   No results found for: VD25OH  No results found for: PREALBUMIN  Latest Ref Rng & Units 01/21/2024    7:54 AM 01/20/2024    6:59 AM 01/19/2024    5:01 AM  CBC EXTENDED  WBC 4.0 - 10.5 K/uL 11.8  13.2  12.9   RBC 4.22 - 5.81 MIL/uL 3.65  3.56  3.63   Hemoglobin 13.0 - 17.0 g/dL 9.7  9.6  9.6   HCT 60.9 - 52.0 % 29.1  28.4  29.2   Platelets 150 - 400 K/uL 266  250  242      There is no height or weight on file to calculate BMI.  Orders:  No orders of the defined types were placed in this encounter.  No orders of the defined types were placed in this encounter.    Procedures: No procedures performed  Clinical Data: No  additional findings.  ROS:  All other systems negative, except as noted in the HPI. Review of Systems  Objective: Vital Signs: There were no vitals taken for this visit.  Specialty Comments:  No specialty comments available.  PMFS History: Patient Active Problem List   Diagnosis Date Noted   Malnutrition of moderate degree 01/24/2024   Depression with anxiety 01/16/2024   Acute ischemic stroke (HCC) 05/03/2023   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/03/2023   Chronic osteomyelitis (HCC) 05/03/2023   Hyperglycemia 03/04/2023   Osteomyelitis of foot, right, acute (HCC) 03/01/2023   Lactic acidosis 03/01/2023   History of transmetatarsal amputation of right foot (HCC) 04/06/2022   Cutaneous abscess of right foot 02/04/2022   Osteomyelitis of great toe of left foot (HCC)    Hyperlipidemia 11/30/2021   Bedbug bite with infection 11/30/2021    Class: Acute   AKI (acute kidney injury) 11/12/2021   Sepsis (HCC) 11/11/2021   Amputated great toe of right foot 02/20/2019   Stage 3a chronic kidney disease (HCC) 02/13/2019   Acute osteomyelitis (HCC)    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/30/2019   Fatty liver 01/09/2019   Thrombocytopenia, unspecified 05/14/2013   Other specified abnormal findings of blood chemistry 05/14/2013   Colon adenoma 09/13/2012   Anxiety 09/06/2012   Intellectual disability 09/06/2012   Chest pain 09/06/2012   GERD (gastroesophageal reflux disease) 03/08/2012   Gout 09/06/2011   Hypertension 03/08/2011   Chronic kidney disease, stage III (moderate) (HCC) 11/30/2009   Urinary tract infection 11/30/2009   Past Medical History:  Diagnosis Date   Anemia    Anxiety    Arthritis    Colon polyps    Depression    Diabetes mellitus without complication (HCC)    DM (diabetes mellitus) (HCC) 01/30/2019   Fatty liver    GERD (gastroesophageal reflux disease)    Gout    High cholesterol    History of echocardiogram 09/14/2011   EF >55%; borderline  concentric LVH;    History of stress test 06/21/2010   exercise; normal study   Hypertension    MET TEST 08/15/2011   moderate peak VO2 limitation at 66% predicted; mod cardiac impairment with low SV & low anaerobic threshold, mild chronotropic incompetence; low risk   Nausea    Osteomyelitis (HCC)    right great toe   Renal disorder    Renal insufficiency    Speech impediment    Thrombocytopenia    PMH    Family History  Problem Relation Age of Onset   Colon cancer Brother     Past Surgical History:  Procedure Laterality Date   AMPUTATION Right 02/13/2019   Procedure: RIGHT GREAT TOE AMPUTATION;  Surgeon: Harden Jerona GAILS, MD;  Location: [facility] OR;  Service: Orthopedics;  Laterality: Right;   AMPUTATION Right 02/04/2022   Procedure: RIGHT TRANSMETATARSAL AMPUTATION APPLICATION OF WOUND VAC;  Surgeon: Harden Jerona GAILS, MD;  Location: [facility] OR;  Service: Orthopedics;  Laterality: Right;   AMPUTATION Right 01/19/2024   Procedure: AMPUTATION, FOOT, RAY;  Surgeon: Harden Jerona GAILS, MD;  Location: [facility] OR;  Service: Orthopedics;  Laterality: Right;  RIGHT FOOT 1ST RAY AMPUTATION   AMPUTATION TOE Left 12/02/2021   Procedure: AMPUTATION TOE;  Surgeon: Mavis Anes, MD;  Location: [facility] ORS;  Service: General;  Laterality: Left;   AMPUTATION TOE Left 12/06/2021   Procedure: CLOSURE OF LEFT GREAT TOE AMPUTATION;  Surgeon: Mavis Anes, MD;  Location: [facility] ORS;  Service: General;  Laterality: Left;   CHOLECYSTECTOMY     COLONOSCOPY N/A 09/21/2012   Procedure: COLONOSCOPY;  Surgeon: Claudis RAYMOND Rivet, MD;  Location: [facility] ENDO SUITE;  Service: Endoscopy;  Laterality: N/A;  730   COLONOSCOPY N/A 01/30/2019   rehman, eight 4-7 mm polyps in rectum in sigmoid and at splenic flexure, transverse colon and in cecum, one 9mm polyp in recto-sigmoid colon, clip was placed, nine polyps total removed   ESOPHAGOGASTRODUODENOSCOPY  11/2005   normal mucosa and GE junction   HERNIA REPAIR     x 2   HIP SURGERY     Rt hip age  34   NASAL POLYP SURGERY      2-3 months ago   POLYPECTOMY  01/30/2019   Procedure: POLYPECTOMY;  Surgeon: Rivet Claudis RAYMOND, MD;  Location: [facility] END[facility] SUITE;  Service: Endoscopy;;  proximal transverse colon, cecal   Social History   Occupational History   Not on file  Tobacco Use   Smoking status: Never   Smokeless tobacco: Never  Vaping Use   Vaping status: Never Used  Substance and Sexual Activity   Alcohol use: No   Drug use: No   Sexual activity: Not on file         "

## 2024-02-17 ENCOUNTER — Other Ambulatory Visit: Payer: Self-pay

## 2024-02-17 ENCOUNTER — Emergency Department (HOSPITAL_COMMUNITY)

## 2024-02-17 ENCOUNTER — Inpatient Hospital Stay (HOSPITAL_COMMUNITY)

## 2024-02-17 ENCOUNTER — Inpatient Hospital Stay (HOSPITAL_COMMUNITY)
Admission: EM | Admit: 2024-02-17 | Discharge: 2024-02-28 | DRG: 854 | Disposition: A | Discharge status: Skilled Nursing Facility | Attending: Internal Medicine | Admitting: Internal Medicine

## 2024-02-17 DIAGNOSIS — Z79899 Other long term (current) drug therapy: Secondary | ICD-10-CM

## 2024-02-17 DIAGNOSIS — N183 Chronic kidney disease, stage 3 unspecified: Secondary | ICD-10-CM | POA: Diagnosis not present

## 2024-02-17 DIAGNOSIS — L02611 Cutaneous abscess of right foot: Secondary | ICD-10-CM | POA: Diagnosis present

## 2024-02-17 DIAGNOSIS — E875 Hyperkalemia: Secondary | ICD-10-CM | POA: Diagnosis present

## 2024-02-17 DIAGNOSIS — E1169 Type 2 diabetes mellitus with other specified complication: Secondary | ICD-10-CM | POA: Diagnosis present

## 2024-02-17 DIAGNOSIS — I10 Essential (primary) hypertension: Secondary | ICD-10-CM | POA: Diagnosis not present

## 2024-02-17 DIAGNOSIS — Z794 Long term (current) use of insulin: Secondary | ICD-10-CM

## 2024-02-17 DIAGNOSIS — A419 Sepsis, unspecified organism: Principal | ICD-10-CM | POA: Diagnosis present

## 2024-02-17 DIAGNOSIS — B9562 Methicillin resistant Staphylococcus aureus infection as the cause of diseases classified elsewhere: Secondary | ICD-10-CM | POA: Diagnosis not present

## 2024-02-17 DIAGNOSIS — I129 Hypertensive chronic kidney disease with stage 1 through stage 4 chronic kidney disease, or unspecified chronic kidney disease: Secondary | ICD-10-CM | POA: Diagnosis present

## 2024-02-17 DIAGNOSIS — A4102 Sepsis due to Methicillin resistant Staphylococcus aureus: Secondary | ICD-10-CM | POA: Diagnosis present

## 2024-02-17 DIAGNOSIS — D72819 Decreased white blood cell count, unspecified: Secondary | ICD-10-CM | POA: Diagnosis present

## 2024-02-17 DIAGNOSIS — L089 Local infection of the skin and subcutaneous tissue, unspecified: Secondary | ICD-10-CM | POA: Diagnosis not present

## 2024-02-17 DIAGNOSIS — Z880 Allergy status to penicillin: Secondary | ICD-10-CM

## 2024-02-17 DIAGNOSIS — N1832 Chronic kidney disease, stage 3b: Secondary | ICD-10-CM | POA: Diagnosis present

## 2024-02-17 DIAGNOSIS — E119 Type 2 diabetes mellitus without complications: Secondary | ICD-10-CM | POA: Diagnosis not present

## 2024-02-17 DIAGNOSIS — E1165 Type 2 diabetes mellitus with hyperglycemia: Secondary | ICD-10-CM | POA: Diagnosis present

## 2024-02-17 DIAGNOSIS — M86172 Other acute osteomyelitis, left ankle and foot: Secondary | ICD-10-CM | POA: Diagnosis not present

## 2024-02-17 DIAGNOSIS — M009 Pyogenic arthritis, unspecified: Secondary | ICD-10-CM | POA: Diagnosis present

## 2024-02-17 DIAGNOSIS — E11628 Type 2 diabetes mellitus with other skin complications: Secondary | ICD-10-CM | POA: Diagnosis not present

## 2024-02-17 DIAGNOSIS — E1121 Type 2 diabetes mellitus with diabetic nephropathy: Secondary | ICD-10-CM | POA: Diagnosis not present

## 2024-02-17 DIAGNOSIS — I69328 Other speech and language deficits following cerebral infarction: Secondary | ICD-10-CM

## 2024-02-17 DIAGNOSIS — Z89511 Acquired absence of right leg below knee: Secondary | ICD-10-CM | POA: Diagnosis not present

## 2024-02-17 DIAGNOSIS — Z89431 Acquired absence of right foot: Secondary | ICD-10-CM | POA: Diagnosis not present

## 2024-02-17 DIAGNOSIS — S98111A Complete traumatic amputation of right great toe, initial encounter: Secondary | ICD-10-CM | POA: Diagnosis not present

## 2024-02-17 DIAGNOSIS — E1122 Type 2 diabetes mellitus with diabetic chronic kidney disease: Secondary | ICD-10-CM | POA: Diagnosis present

## 2024-02-17 DIAGNOSIS — E871 Hypo-osmolality and hyponatremia: Secondary | ICD-10-CM | POA: Diagnosis present

## 2024-02-17 DIAGNOSIS — K760 Fatty (change of) liver, not elsewhere classified: Secondary | ICD-10-CM | POA: Diagnosis present

## 2024-02-17 DIAGNOSIS — Z91041 Radiographic dye allergy status: Secondary | ICD-10-CM

## 2024-02-17 DIAGNOSIS — M109 Gout, unspecified: Secondary | ICD-10-CM | POA: Diagnosis present

## 2024-02-17 DIAGNOSIS — F79 Unspecified intellectual disabilities: Secondary | ICD-10-CM | POA: Diagnosis present

## 2024-02-17 DIAGNOSIS — L03115 Cellulitis of right lower limb: Secondary | ICD-10-CM | POA: Diagnosis present

## 2024-02-17 DIAGNOSIS — M869 Osteomyelitis, unspecified: Secondary | ICD-10-CM | POA: Diagnosis present

## 2024-02-17 DIAGNOSIS — R6520 Severe sepsis without septic shock: Secondary | ICD-10-CM | POA: Diagnosis present

## 2024-02-17 DIAGNOSIS — R7881 Bacteremia: Secondary | ICD-10-CM | POA: Diagnosis not present

## 2024-02-17 DIAGNOSIS — D62 Acute posthemorrhagic anemia: Secondary | ICD-10-CM | POA: Diagnosis not present

## 2024-02-17 DIAGNOSIS — E1149 Type 2 diabetes mellitus with other diabetic neurological complication: Secondary | ICD-10-CM | POA: Diagnosis present

## 2024-02-17 DIAGNOSIS — E7800 Pure hypercholesterolemia, unspecified: Secondary | ICD-10-CM | POA: Diagnosis present

## 2024-02-17 DIAGNOSIS — M86271 Subacute osteomyelitis, right ankle and foot: Secondary | ICD-10-CM

## 2024-02-17 DIAGNOSIS — Z7984 Long term (current) use of oral hypoglycemic drugs: Secondary | ICD-10-CM

## 2024-02-17 DIAGNOSIS — E860 Dehydration: Secondary | ICD-10-CM | POA: Diagnosis present

## 2024-02-17 DIAGNOSIS — K219 Gastro-esophageal reflux disease without esophagitis: Secondary | ICD-10-CM | POA: Diagnosis not present

## 2024-02-17 DIAGNOSIS — R21 Rash and other nonspecific skin eruption: Secondary | ICD-10-CM | POA: Diagnosis present

## 2024-02-17 DIAGNOSIS — K529 Noninfective gastroenteritis and colitis, unspecified: Secondary | ICD-10-CM | POA: Diagnosis not present

## 2024-02-17 DIAGNOSIS — Z1152 Encounter for screening for COVID-19: Secondary | ICD-10-CM | POA: Diagnosis not present

## 2024-02-17 DIAGNOSIS — R112 Nausea with vomiting, unspecified: Secondary | ICD-10-CM | POA: Diagnosis not present

## 2024-02-17 DIAGNOSIS — E8809 Other disorders of plasma-protein metabolism, not elsewhere classified: Secondary | ICD-10-CM | POA: Diagnosis present

## 2024-02-17 DIAGNOSIS — N1831 Chronic kidney disease, stage 3a: Secondary | ICD-10-CM | POA: Diagnosis present

## 2024-02-17 DIAGNOSIS — Z800 Family history of malignant neoplasm of digestive organs: Secondary | ICD-10-CM

## 2024-02-17 DIAGNOSIS — I619 Nontraumatic intracerebral hemorrhage, unspecified: Secondary | ICD-10-CM | POA: Diagnosis present

## 2024-02-17 DIAGNOSIS — E876 Hypokalemia: Secondary | ICD-10-CM | POA: Diagnosis not present

## 2024-02-17 DIAGNOSIS — N179 Acute kidney failure, unspecified: Secondary | ICD-10-CM | POA: Diagnosis present

## 2024-02-17 DIAGNOSIS — Z89422 Acquired absence of other left toe(s): Secondary | ICD-10-CM

## 2024-02-17 DIAGNOSIS — Z887 Allergy status to serum and vaccine status: Secondary | ICD-10-CM

## 2024-02-17 DIAGNOSIS — M19071 Primary osteoarthritis, right ankle and foot: Secondary | ICD-10-CM | POA: Diagnosis present

## 2024-02-17 DIAGNOSIS — E872 Acidosis, unspecified: Secondary | ICD-10-CM | POA: Diagnosis present

## 2024-02-17 DIAGNOSIS — F419 Anxiety disorder, unspecified: Secondary | ICD-10-CM | POA: Diagnosis present

## 2024-02-17 LAB — CBC WITH DIFFERENTIAL/PLATELET
Abs Immature Granulocytes: 0.03 K/uL (ref 0.00–0.07)
Basophils Absolute: 0 K/uL (ref 0.0–0.1)
Basophils Relative: 0 %
Eosinophils Absolute: 0 K/uL (ref 0.0–0.5)
Eosinophils Relative: 0 %
HCT: 38.8 % — ABNORMAL LOW (ref 39.0–52.0)
Hemoglobin: 12.6 g/dL — ABNORMAL LOW (ref 13.0–17.0)
Immature Granulocytes: 1 %
Lymphocytes Relative: 22 %
Lymphs Abs: 1 K/uL (ref 0.7–4.0)
MCH: 26.5 pg (ref 26.0–34.0)
MCHC: 32.5 g/dL (ref 30.0–36.0)
MCV: 81.5 fL (ref 80.0–100.0)
Monocytes Absolute: 0.2 K/uL (ref 0.1–1.0)
Monocytes Relative: 5 %
Neutro Abs: 3.4 K/uL (ref 1.7–7.7)
Neutrophils Relative %: 72 %
Platelets: 364 K/uL (ref 150–400)
RBC: 4.76 MIL/uL (ref 4.22–5.81)
RDW: 14.8 % (ref 11.5–15.5)
WBC: 4.8 K/uL (ref 4.0–10.5)
nRBC: 0 % (ref 0.0–0.2)

## 2024-02-17 LAB — COMPREHENSIVE METABOLIC PANEL WITH GFR
ALT: 20 U/L (ref 0–44)
AST: 19 U/L (ref 15–41)
Albumin: 3.6 g/dL (ref 3.5–5.0)
Alkaline Phosphatase: 77 U/L (ref 38–126)
Anion gap: 21 — ABNORMAL HIGH (ref 5–15)
BUN: 51 mg/dL — ABNORMAL HIGH (ref 8–23)
CO2: 20 mmol/L — ABNORMAL LOW (ref 22–32)
Calcium: 8.7 mg/dL — ABNORMAL LOW (ref 8.9–10.3)
Chloride: 94 mmol/L — ABNORMAL LOW (ref 98–111)
Creatinine, Ser: 4.82 mg/dL — ABNORMAL HIGH (ref 0.61–1.24)
GFR, Estimated: 12 mL/min — ABNORMAL LOW
Glucose, Bld: 267 mg/dL — ABNORMAL HIGH (ref 70–99)
Potassium: 3.9 mmol/L (ref 3.5–5.1)
Sodium: 135 mmol/L (ref 135–145)
Total Bilirubin: 0.7 mg/dL (ref 0.0–1.2)
Total Protein: 7.9 g/dL (ref 6.5–8.1)

## 2024-02-17 LAB — LACTIC ACID, PLASMA
Lactic Acid, Venous: 2.8 mmol/L (ref 0.5–1.9)
Lactic Acid, Venous: 2.5 mmol/L (ref 0.5–1.9)

## 2024-02-17 LAB — GLUCOSE, CAPILLARY
Glucose-Capillary: 204 mg/dL — ABNORMAL HIGH (ref 70–99)
Glucose-Capillary: 205 mg/dL — ABNORMAL HIGH (ref 70–99)

## 2024-02-17 LAB — BETA-HYDROXYBUTYRIC ACID: Beta-Hydroxybutyric Acid: 0.52 mmol/L — ABNORMAL HIGH (ref 0.05–0.27)

## 2024-02-17 LAB — RESP PANEL BY RT-PCR (RSV, FLU A&B, COVID)  RVPGX2
Influenza A by PCR: NEGATIVE
Influenza B by PCR: NEGATIVE
Resp Syncytial Virus by PCR: NEGATIVE
SARS Coronavirus 2 by RT PCR: NEGATIVE

## 2024-02-17 LAB — PROTIME-INR
INR: 1.1 (ref 0.8–1.2)
Prothrombin Time: 14.3 s (ref 11.4–15.2)

## 2024-02-17 MED ORDER — HEPARIN SODIUM (PORCINE) 5000 UNIT/ML IJ SOLN
5000.0000 [IU] | Freq: Three times a day (TID) | INTRAMUSCULAR | Status: DC
  Administered 2024-02-17 – 2024-02-21 (×11): 5000 [IU] via SUBCUTANEOUS
  Filled 2024-02-17 (×11): qty 1

## 2024-02-17 MED ORDER — LACTATED RINGERS IV BOLUS (SEPSIS)
1000.0000 mL | Freq: Once | INTRAVENOUS | Status: AC
  Administered 2024-02-17: 1000 mL via INTRAVENOUS

## 2024-02-17 MED ORDER — INSULIN GLARGINE-YFGN 100 UNIT/ML ~~LOC~~ SOLN: 15.0000 [IU] | Freq: Every day | SUBCUTANEOUS | Status: DC

## 2024-02-17 MED ORDER — ONDANSETRON HCL 4 MG PO TABS: 4.0000 mg | ORAL_TABLET | Freq: Four times a day (QID) | ORAL | Status: DC | PRN

## 2024-02-17 MED ORDER — POLYETHYLENE GLYCOL 3350 17 G PO PACK: 17.0000 g | PACK | Freq: Every day | ORAL | Status: DC | PRN

## 2024-02-17 MED ORDER — SODIUM CHLORIDE 0.9 % IV SOLN
2.0000 g | INTRAVENOUS | Status: DC
  Administered 2024-02-17: 2 g via INTRAVENOUS
  Filled 2024-02-17: qty 20

## 2024-02-17 MED ORDER — LINEZOLID 600 MG/300ML IV SOLN
600.0000 mg | Freq: Two times a day (BID) | INTRAVENOUS | Status: DC
  Administered 2024-02-18 (×2): 600 mg via INTRAVENOUS
  Filled 2024-02-17 (×4): qty 300

## 2024-02-17 MED ORDER — INSULIN ASPART 100 UNIT/ML IJ SOLN: 0.0000 [IU] | Freq: Three times a day (TID) | INTRAMUSCULAR | Status: DC

## 2024-02-17 MED ORDER — LORAZEPAM 2 MG/ML IJ SOLN
0.5000 mg | Freq: Once | INTRAMUSCULAR | Status: DC
  Filled 2024-02-17: qty 1

## 2024-02-17 MED ORDER — ACETAMINOPHEN 650 MG RE SUPP: 650.0000 mg | Freq: Four times a day (QID) | RECTAL | Status: DC | PRN

## 2024-02-17 MED ORDER — ACETAMINOPHEN 325 MG PO TABS: 650.0000 mg | ORAL_TABLET | Freq: Four times a day (QID) | ORAL | Status: DC | PRN

## 2024-02-17 MED ORDER — INSULIN GLARGINE-YFGN 100 UNIT/ML ~~LOC~~ SOLN
10.0000 [IU] | Freq: Every day | SUBCUTANEOUS | Status: DC
  Administered 2024-02-17 – 2024-02-26 (×10): 10 [IU] via SUBCUTANEOUS
  Filled 2024-02-17 (×15): qty 0.1

## 2024-02-17 MED ORDER — SODIUM CHLORIDE 0.9 % IV SOLN: INTRAVENOUS | Status: DC

## 2024-02-17 MED ORDER — ONDANSETRON HCL 4 MG/2ML IJ SOLN: 4.0000 mg | Freq: Four times a day (QID) | INTRAMUSCULAR | Status: DC | PRN

## 2024-02-17 MED ORDER — VANCOMYCIN HCL 2000 MG/400ML IV SOLN
2000.0000 mg | Freq: Once | INTRAVENOUS | Status: AC
  Administered 2024-02-17: 2000 mg via INTRAVENOUS
  Filled 2024-02-17: qty 400

## 2024-02-17 MED ORDER — LACTATED RINGERS IV SOLN: INTRAVENOUS | Status: DC

## 2024-02-17 MED ORDER — INSULIN ASPART 100 UNIT/ML IJ SOLN
0.0000 [IU] | Freq: Every day | INTRAMUSCULAR | Status: DC
  Filled 2024-02-17: qty 1

## 2024-02-17 MED ORDER — VANCOMYCIN HCL IN DEXTROSE 1-5 GM/200ML-% IV SOLN: 1000.0000 mg | Freq: Once | INTRAVENOUS | Status: DC

## 2024-02-17 MED ORDER — INSULIN ASPART 100 UNIT/ML IJ SOLN
0.0000 [IU] | INTRAMUSCULAR | Status: DC
  Administered 2024-02-18: 5 [IU] via SUBCUTANEOUS
  Administered 2024-02-18: 3 [IU] via SUBCUTANEOUS
  Administered 2024-02-18: 2 [IU] via SUBCUTANEOUS
  Administered 2024-02-18: 5 [IU] via SUBCUTANEOUS
  Administered 2024-02-18 – 2024-02-19 (×4): 2 [IU] via SUBCUTANEOUS
  Administered 2024-02-19: 3 [IU] via SUBCUTANEOUS
  Administered 2024-02-19 – 2024-02-20 (×5): 2 [IU] via SUBCUTANEOUS
  Administered 2024-02-21: 3 [IU] via SUBCUTANEOUS
  Administered 2024-02-21 – 2024-02-22 (×2): 2 [IU] via SUBCUTANEOUS
  Administered 2024-02-23: 3 [IU] via SUBCUTANEOUS
  Administered 2024-02-23 (×3): 2 [IU] via SUBCUTANEOUS
  Administered 2024-02-24 (×2): 3 [IU] via SUBCUTANEOUS
  Administered 2024-02-24 (×3): 2 [IU] via SUBCUTANEOUS
  Filled 2024-02-17: qty 3
  Filled 2024-02-17 (×3): qty 2
  Filled 2024-02-17: qty 1
  Filled 2024-02-17: qty 2
  Filled 2024-02-17 (×2): qty 1
  Filled 2024-02-17 (×4): qty 2
  Filled 2024-02-17 (×2): qty 3
  Filled 2024-02-17: qty 2
  Filled 2024-02-17: qty 1
  Filled 2024-02-17: qty 3
  Filled 2024-02-17 (×2): qty 2
  Filled 2024-02-17: qty 3
  Filled 2024-02-17: qty 1
  Filled 2024-02-17 (×3): qty 2
  Filled 2024-02-17: qty 1

## 2024-02-17 MED ORDER — SODIUM CHLORIDE 0.9 % IV BOLUS
500.0000 mL | Freq: Once | INTRAVENOUS | Status: AC
  Administered 2024-02-17: 500 mL via INTRAVENOUS

## 2024-02-17 MED ORDER — SODIUM CHLORIDE 0.9 % IV SOLN
3.0000 g | Freq: Two times a day (BID) | INTRAVENOUS | Status: DC
  Administered 2024-02-18 – 2024-02-22 (×9): 3 g via INTRAVENOUS
  Filled 2024-02-17 (×9): qty 8

## 2024-02-17 NOTE — Progress Notes (Signed)
 Elink following for sepsis protocol.

## 2024-02-17 NOTE — ED Triage Notes (Signed)
 Pt arrived via [facility]. Pt states two weeks ago he had surgery on his right foot due to an infection on his foot. Pt states that he didn't know that his foot was still infected from the surgery. Pt's foot has open wounds on ankle and on top of foot. Upon assessment pt has a red rash all over his torso and legs. Pt also tachy on monitor at 135. MD present during triage.

## 2024-02-17 NOTE — ED Provider Notes (Signed)
 " [place] EMERGENCY DEPARTMENT AT [facility] Provider Note   CSN: [id number] Arrival date & time: 02/17/24  1404     Patient presents with: Post-op Problem   [name] is a 74 y.o. male.   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  The paramedics found the patient have a heart rate of 140 a blood sugar of 330, he does endorse eating some cornflakes because that so he can keep down but is been vomiting everything else up today.  He states he did take his insulin  today with a blood sugar of 240 but was found to be over 300 when paramedics arrived  Prior to Admission medications  Medication Sig Start Date End Date Taking? Authorizing Provider  ACCU-CHEK GUIDE test strip USE AS DIRECTED TO TEST TWICE DAILY 02/01/22   [provider]  acetaminophen  (TYLENOL ) 500 MG tablet Take 2 tablets (1,000 mg total) by mouth every 8 (eight) hours as needed for mild pain (pain score 1-3) or moderate pain (pain score 4-6). Pain 06/03/23   Suellen Cantor A, PA-C   atorvastatin  (LIPITOR) 10 MG tablet Take 10 mg by mouth at bedtime. 09/21/17   [provider]  B Complex-C (B-COMPLEX WITH VITAMIN C ) tablet Take 1 tablet by mouth daily.    [provider]  buPROPion  (WELLBUTRIN  XL) 150 MG 24 hr tablet Take 150 mg by mouth at bedtime.  08/29/12   [provider]  Cholecalciferol  (VITAMIN D ) 50 MCG (2000 UT) CAPS Take 2,000 Units by mouth daily.     [provider]  feeding supplement (ENSURE ENLIVE / ENSURE PLUS) LIQD Take 237 mLs by mouth 2 (two) times daily between meals. 01/24/24   Akula, Vijaya, MD  glimepiride (AMARYL) 4 MG tablet Take 4 mg by mouth in the morning and at bedtime. 11/04/23   [provider]  hydrALAZINE  (APRESOLINE ) 25 MG tablet TAKE 1 TABLET BY MOUTH IN THE MORNING AND AT BEDTIME. MAY TAKE ADDITIONAL AS NEEDED FOR SYSTOLIC BLOOD PRESSURE CONSISTENTLY ABOVE 140. Patient taking differently: Take 25 mg by mouth in the morning and at bedtime. 04/24/23   Alvan Dorn FALCON, MD  insulin  aspart (NOVOLOG ) 100 UNIT/ML injection CBG 70 - 120: 0 units  CBG 121 - 150: 2 units  CBG 151 - 200: 3 units  CBG 201 - 250: 5 units  CBG 251 - 300: 8 units  CBG 301 - 350: 11 units  CBG 351 - 400: 15 units 01/24/24   Akula, Vijaya, MD  insulin  glargine-yfgn (SEMGLEE ) 100 UNIT/ML injection Inject 0.15 mLs (  15 Units total) into the skin at bedtime. 01/24/24   Akula, Vijaya, MD  metoprolol  succinate (TOPROL -XL) 100 MG 24 hr tablet Take 1 tablet (100 mg total) by mouth daily. Take with or immediately following a meal. 01/25/24   Akula, Vijaya, MD  Multiple Vitamin (MULTIVITAMIN WITH MINERALS) TABS tablet Take 1 tablet by mouth daily. 01/25/24   Cherlyn Labella, MD  omeprazole  (PRILOSEC) 20 MG capsule TAKE ONE CAPSULE BY MOUTH DAILY. NEEDS OFFICE VISIT FOR FURTHER REFILLS. Patient taking differently: Take 20 mg by mouth daily. TAKE ONE CAPSULE BY MOUTH DAILY. NEEDS OFFICE VISIT FOR FURTHER REFILLS. 01/24/22   Carlan, Chelsea L,  NP  ondansetron  (ZOFRAN ) 4 MG tablet Take 1 tablet (4 mg total) by mouth every 6 (six) hours as needed for nausea. 01/24/24   Akula, Vijaya, MD  senna-docusate (SENOKOT-S) 8.6-50 MG tablet Take 2 tablets by mouth at bedtime as needed for mild constipation. 01/24/24   Akula, Vijaya, MD  sertraline  (ZOLOFT ) 100 MG tablet Take 100 mg by mouth at bedtime.  05/09/15   [provider]  sodium bicarbonate  650 MG tablet Take 650 mg by mouth 2 (two) times daily. 07/18/17   [provider]  thiamine  (VITAMIN B-1) 100 MG tablet Take 1 tablet (100 mg total) by mouth daily. 01/25/24   Akula, Vijaya, MD  ULORIC  80 MG TABS Take 80 mg by mouth daily.  04/21/15   [provider]    Allergies: Ivp dye [iodinated contrast media], Penicillins, and Tetanus toxoid-containing vaccines    Review of Systems  All other systems reviewed and are negative.   Updated Vital Signs BP (!) 140/71   Pulse (!) 130   Temp 98.3 F (36.8 C) (Oral)   Resp (!) 29   Ht 1.88 m (6' 2)   Wt 90.2 kg   SpO2 98%   BMI 25.54 kg/m   Physical Exam Vitals and nursing note reviewed.  Constitutional:      General: He is not in acute distress.    Appearance: He is well-developed. He is ill-appearing.  HENT:     Head: Normocephalic and atraumatic.     Mouth/Throat:     Mouth: Mucous membranes are dry.     Pharynx: No oropharyngeal exudate.  Eyes:     General: No scleral icterus.       Right eye: No discharge.        Left eye: No discharge.     Conjunctiva/sclera: Conjunctivae normal.     Pupils: Pupils are equal, round, and reactive to light.  Neck:     Thyroid : No thyromegaly.     Vascular: No JVD.  Cardiovascular:     Rate and Rhythm: Regular rhythm. Tachycardia present.     Heart sounds: Normal heart sounds. No murmur heard.    No friction rub. No gallop.  Pulmonary:     Effort: Pulmonary effort is normal. No respiratory distress.     Breath sounds: Normal breath sounds. No wheezing or rales.   Abdominal:     General: Bowel sounds are normal. There is no distension.     Palpations: Abdomen is soft. There is no mass.     Tenderness: There is no abdominal tenderness.  Musculoskeletal:        General: Tenderness present. Normal range of motion.     Cervical back: Normal range of motion and neck supple.     Right lower leg: No edema.     Left lower leg: No edema.  Comments: Right foot examined and evaluated, dressing was taken down, the surgical site appears to be intact however on the inner right heel there appears to be an area expressing purulence from the foot  Lymphadenopathy:     Cervical: No cervical adenopathy.  Skin:    General: Skin is warm and dry.     Findings: Rash present. No erythema.     Comments: Papular blanching rash across the trunk and arms  Neurological:     Mental Status: He is alert.     Coordination: Coordination normal.  Psychiatric:        Behavior: Behavior normal.     (all labs ordered are listed, but only abnormal results are displayed) Labs Reviewed  LACTIC ACID, PLASMA - Abnormal; Notable for the following components:      Result Value   Lactic Acid, Venous 2.8 (*)    All other components within normal limits  COMPREHENSIVE METABOLIC PANEL WITH GFR - Abnormal; Notable for the following components:   Chloride 94 (*)    CO2 20 (*)    Glucose, Bld 267 (*)    BUN 51 (*)    Creatinine, Ser 4.82 (*)    Calcium  8.7 (*)    GFR, Estimated 12 (*)    Anion gap 21 (*)    All other components within normal limits  CBC WITH DIFFERENTIAL/PLATELET - Abnormal; Notable for the following components:   Hemoglobin 12.6 (*)    HCT 38.8 (*)    All other components within normal limits  CULTURE, BLOOD (ROUTINE X 2)  CULTURE, BLOOD (ROUTINE X 2)  RESP PANEL BY RT-PCR (RSV, FLU A&B, COVID)  RVPGX2  PROTIME-INR  LACTIC ACID, PLASMA  URINALYSIS, W/ REFLEX TO CULTURE (INFECTION SUSPECTED)    EKG: EKG Interpretation Date/Time:  Saturday February 17 2024 14:15:55 EST Ventricular Rate:  133 PR Interval:  125 QRS Duration:  95 QT Interval:  305 QTC Calculation: 454 R Axis:   -27  Text Interpretation: Sinus tachycardia Borderline left axis deviation Abnormal R-wave progression, early transition Abnormal T, consider ischemia, lateral leads Since last tracing rate faster Confirmed by Cleotilde Rogue ([id number]) on 02/17/2024 2:19:58 PM  Radiology: No results found.   .Critical Care  Performed by: Cleotilde Rogue, MD Authorized by: Cleotilde Rogue, MD   Critical care provider statement:    Critical care time (minutes):  45   Critical care time was exclusive of:  Separately billable procedures and treating other patients and teaching time   Critical care was necessary to treat or prevent imminent or life-threatening deterioration of the following conditions:  Sepsis and renal failure   Critical care was time spent personally by me on the following activities:  Development of treatment plan with patient or surrogate, discussions with consultants, evaluation of patient's response to treatment, examination of patient, obtaining history from patient or surrogate, review of old charts, re-evaluation of patient's condition, pulse oximetry, ordering and review of radiographic studies, ordering and review of laboratory studies and ordering and performing treatments and interventions   I assumed direction of critical care for this patient from another provider in my specialty: no     Care discussed with: admitting provider   Comments:          Medications Ordered in the ED  lactated ringers  infusion ( Intravenous New Bag/Given 02/17/24 1459)  lactated ringers  bolus 1,000 mL (1,000 mLs Intravenous New Bag/Given 02/17/24 1450)  vancomycin  (VANCOREADY) IVPB 2000 mg/400 mL (2,000 mg Intravenous New Bag/Given 02/17/24 1500)  Medical Decision Making Amount and/or Complexity of Data Reviewed Labs: ordered. Radiology:  ordered.  Risk Prescription drug management. Decision regarding hospitalization.    This patient presents to the ED for concern of generalized weakness nausea vomiting and diarrhea with what appears to be an infection of the right foot, this is in a different location than his prior infection and raises concern for ongoing deep tissue infection.  He is tachycardic, ill-appearing, nauseated and dehydrated, he is also hyperglycemic, this involves an extensive number of treatment options, and is a complaint that carries with it a high risk of complications and morbidity.  The differential diagnosis includes sepsis, DKA, osteomyelitis, dehydration, AKI   Co morbidities / Chronic conditions that complicate the patient evaluation  Diabetes Prior amputation of the foot Lives independently   Additional history obtained:  Additional history obtained from EMR External records from outside source obtained and reviewed including record as above, paramedics report as well.   Lab Tests:  I Ordered, and personally interpreted labs.  The pertinent results include: Acute kidney injury with elevated creatinine, 4.82 with a baseline of 2.0, BUN is 51, this is consistent with a dehydrated state.  Lactate is 2.8, CBC shows white blood cell count of 4800 with a normal hemoglobin   Imaging Studies ordered:  I ordered imaging studies including x-ray of the chest and the foot I independently visualized and interpreted imaging which showed no acute findings I agree with the radiologist interpretation   Cardiac Monitoring: / EKG:  The patient was maintained on a cardiac monitor.  I personally viewed and interpreted the cardiac monitored which showed an underlying rhythm of: Sinus tachycardia   Problem List / ED Course / Critical interventions / Medication management  Fluids and antibiotics for resuscitation I ordered medication including Comycin and lactated Ringer 's Reevaluation of the patient  after these medicines showed that the patient improvement I have reviewed the patients home medicines and have made adjustments as needed   Consultations Obtained:  I requested consultation with the hospitalist Dr. Pearlean,  and discussed lab and imaging findings as well as pertinent plan - they recommend: Admission to the hospital   Social Determinants of Health:  Prior amputation, poorly controlled diabetic   Test / Admission - Considered:  Admit to higher level of care      Final diagnoses:  Sepsis with acute renal failure without septic shock, due to unspecified organism, unspecified acute renal failure type (HCC)  AKI (acute kidney injury)    ED Discharge Orders     None          Cleotilde Rogue, MD 02/17/24 1540  "

## 2024-02-17 NOTE — H&P (Addendum)
 " History and Physical    [name] FMW:[id number] DOB: 11-Jul-1950 DOA: 02/17/2024  PCP: Marvine Rush, MD   Patient coming from: Home  I have personally briefly reviewed patient's old medical records in [facility]  Chief Complaint: Vomiting, diarrhea  HPI: [name] is a 74 y.o. 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 Patient with baseline slurred speech, hard to understand.  He presented to the ED with complaints of generalized weakness, nausea and vomiting and diarrhea that started last night.  Reports a mild ache to his mid upper abdomen.  Reports about 4 episodes of vomiting with loose stools.  No fevers no chills.  No urinary symptoms.  Reports compliance with his insulin .  No chest pain no difficulty breathing.  Recent hospitalization 12/2 to 12/10 for sepsis 2/2 osteomyelitis of the first distal metatarsal.  Treated with IV Vanco and ceftriaxone , Ortho consulted, underwent first ray amputation by Dr. Harden 12/3.  ED Course: Temperature 98.3.  Heart rate 130s, respiratory 12-29.  Blood pressure 140/71.  O2 sats greater 98% on room air. WBC 4.8. Lactic acidosis of 2.8. Anion gap of 21, serum bicarb of 20, CBG of 267. COVID influenza RSV negative. Right foot x-ray-soft tissue wound over distal second metatarsal, no underlying cortical erosion to suggest osteomyelitis.  1st-5th ray amputations. IV vancomycin  started. 1 L bolus given and LR started at 150 cc/h Hospitalist admit for AKI.  Review of Systems: As per HPI all other systems reviewed and negative.  Past Medical History:  Diagnosis Date   Anemia    Anxiety    Arthritis    Colon polyps    Depression    Diabetes mellitus without complication (HCC)    DM (diabetes mellitus) (HCC) 01/30/2019   Fatty liver    GERD (gastroesophageal reflux disease)    Gout    High cholesterol    History of  echocardiogram 09/14/2011   EF >55%; borderline concentric LVH;    History of stress test 06/21/2010   exercise; normal study   Hypertension    MET TEST 08/15/2011   moderate peak VO2 limitation at 66% predicted; mod cardiac impairment with low SV & low anaerobic threshold, mild chronotropic incompetence; low risk   Nausea    Osteomyelitis (HCC)    right great toe   Renal disorder    Renal insufficiency    Speech impediment    Thrombocytopenia    PMH    Past Surgical History:  Procedure Laterality Date   AMPUTATION Right 02/13/2019   Procedure: RIGHT GREAT TOE AMPUTATION;  Surgeon: Harden Jerona GAILS, MD;  Location: [facility] OR;  Service: Orthopedics;  Laterality: Right;   AMPUTATION Right 02/04/2022   Procedure: RIGHT TRANSMETATARSAL AMPUTATION APPLICATION OF WOUND VAC;  Surgeon: Harden Jerona GAILS, MD;  Location: [facility] OR;  Service: Orthopedics;  Laterality: Right;   AMPUTATION Right 01/19/2024   Procedure: AMPUTATION, FOOT, RAY;  Surgeon: Harden Jerona GAILS, MD;  Location: [facility] OR;  Service: Orthopedics;  Laterality: Right;  RIGHT FOOT 1ST RAY AMPUTATION   AMPUTATION TOE Left 12/02/2021   Procedure: AMPUTATION TOE;  Surgeon: Mavis Anes, MD;  Location: [facility] ORS;  Service: General;  Laterality: Left;   AMPUTATION TOE Left 12/06/2021   Procedure: CLOSURE OF LEFT GREAT TOE AMPUTATION;  Surgeon: Mavis Anes, MD;  Location: [facility] ORS;  Service: General;  Laterality: Left;   CHOLECYSTECTOMY     COLONOSCOPY N/A 09/21/2012  Procedure: COLONOSCOPY;  Surgeon: Claudis RAYMOND Rivet, MD;  Location: [facility];  Service: Endoscopy;  Laterality: N/A;  730   COLONOSCOPY N/A 01/30/2019   rehman, eight 4-7 mm polyps in rectum in sigmoid and at splenic flexure, transverse colon and in cecum, one 9mm polyp in recto-sigmoid colon, clip was placed, nine polyps total removed   ESOPHAGOGASTRODUODENOSCOPY  11/2005   normal mucosa and GE junction   HERNIA REPAIR     x 2   HIP SURGERY     Rt hip age 60   NASAL POLYP SURGERY       2-3 months ago   POLYPECTOMY  01/30/2019   Procedure: POLYPECTOMY;  Surgeon: Rivet Claudis RAYMOND, MD;  Location: [facility];  Service: Endoscopy;;  proximal transverse colon, cecal     reports that he has never smoked. He has never used smokeless tobacco. He reports that he does not drink alcohol and does not use drugs.  Allergies[1]  Family History  Problem Relation Age of Onset   Colon cancer Brother     Prior to Admission medications  Medication Sig Start Date End Date Taking? Authorizing Provider  ACCU-CHEK GUIDE test strip USE AS DIRECTED TO TEST TWICE DAILY 02/01/22   [provider]  acetaminophen  (TYLENOL ) 500 MG tablet Take 2 tablets (1,000 mg total) by mouth every 8 (eight) hours as needed for mild pain (pain score 1-3) or moderate pain (pain score 4-6). Pain 06/03/23   Suellen Cantor A, PA-C  atorvastatin  (LIPITOR) 10 MG tablet Take 10 mg by mouth at bedtime. 09/21/17   [provider]  B Complex-C (B-COMPLEX WITH VITAMIN C ) tablet Take 1 tablet by mouth daily.    [provider]  buPROPion  (WELLBUTRIN  XL) 150 MG 24 hr tablet Take 150 mg by mouth at bedtime.  08/29/12   [provider]  Cholecalciferol  (VITAMIN D ) 50 MCG (2000 UT) CAPS Take 2,000 Units by mouth daily.     [provider]  feeding supplement (ENSURE ENLIVE / ENSURE PLUS) LIQD Take 237 mLs by mouth 2 (two) times daily between meals. 01/24/24   Akula, Vijaya, MD  glimepiride (AMARYL) 4 MG tablet Take 4 mg by mouth in the morning and at bedtime. 11/04/23   [provider]  hydrALAZINE  (APRESOLINE ) 25 MG tablet TAKE 1 TABLET BY MOUTH IN THE MORNING AND AT BEDTIME. MAY TAKE ADDITIONAL AS NEEDED FOR SYSTOLIC BLOOD PRESSURE CONSISTENTLY ABOVE 140. Patient taking differently: Take 25 mg by mouth in the morning and at bedtime. 04/24/23   Alvan Dorn FALCON, MD  insulin  aspart (NOVOLOG ) 100 UNIT/ML injection CBG 70 - 120: 0 units  CBG 121 - 150: 2 units  CBG 151 - 200:  3 units  CBG 201 - 250: 5 units  CBG 251 - 300: 8 units  CBG 301 - 350: 11 units  CBG 351 - 400: 15 units 01/24/24   Akula, Vijaya, MD  insulin  glargine-yfgn (SEMGLEE ) 100 UNIT/ML injection Inject 0.15 mLs (15 Units total) into the skin at bedtime. 01/24/24   Akula, Vijaya, MD  metoprolol  succinate (TOPROL -XL) 100 MG 24 hr tablet Take 1 tablet (100 mg total) by mouth daily. Take with or immediately following a meal. 01/25/24   Akula, Vijaya, MD  Multiple Vitamin (MULTIVITAMIN WITH MINERALS) TABS tablet Take 1 tablet by mouth daily. 01/25/24   Cherlyn Labella, MD  omeprazole  (PRILOSEC) 20 MG capsule TAKE ONE CAPSULE BY MOUTH DAILY. NEEDS OFFICE VISIT FOR FURTHER REFILLS. Patient taking differently: Take 20 mg  by mouth daily. TAKE ONE CAPSULE BY MOUTH DAILY. NEEDS OFFICE VISIT FOR FURTHER REFILLS. 01/24/22   Carlan, Chelsea L, NP  ondansetron  (ZOFRAN ) 4 MG tablet Take 1 tablet (4 mg total) by mouth every 6 (six) hours as needed for nausea. 01/24/24   Akula, Vijaya, MD  senna-docusate (SENOKOT-S) 8.6-50 MG tablet Take 2 tablets by mouth at bedtime as needed for mild constipation. 01/24/24   Akula, Vijaya, MD  sertraline  (ZOLOFT ) 100 MG tablet Take 100 mg by mouth at bedtime.  05/09/15   [provider]  sodium bicarbonate  650 MG tablet Take 650 mg by mouth 2 (two) times daily. 07/18/17   [provider]  thiamine  (VITAMIN B-1) 100 MG tablet Take 1 tablet (100 mg total) by mouth daily. 01/25/24   Akula, Vijaya, MD  ULORIC  80 MG TABS Take 80 mg by mouth daily.  04/21/15   [provider]    Physical Exam: Vitals:   02/17/24 1420 02/17/24 1423 02/17/24 1430 02/17/24 1445  BP:      Pulse:   (!) 131 (!) 130  Resp:   (!) 24 (!) 29  Temp:  98.3 F (36.8 C)    TempSrc:  Oral    SpO2:   99% 98%  Weight: 90.2 kg     Height: 6' 2 (1.88 m)       Constitutional: NAD, calm, comfortable Vitals:   02/17/24 1420 02/17/24 1423 02/17/24 1430 02/17/24 1445  BP:      Pulse:   (!) 131  (!) 130  Resp:   (!) 24 (!) 29  Temp:  98.3 F (36.8 C)    TempSrc:  Oral    SpO2:   99% 98%  Weight: 90.2 kg     Height: 6' 2 (1.88 m)      Eyes: PERRL, lids and conjunctivae normal ENMT: Mucous membranes are moist.  Neck: normal, supple, no masses, no thyromegaly Respiratory: clear to auscultation bilaterally, no wheezing, no crackles. Normal respiratory effort. No accessory muscle use.  Cardiovascular: Regular rate and rhythm, no murmurs / rubs / gallops. No extremity edema.  Extremities warm.   Abdomen: no tenderness, no masses palpated. No hepatosplenomegaly.   Musculoskeletal: no clubbing / cyanosis.  Ray amputation of the right foot, left foot amputation of the first toe. Skin: Recent surgical site appears to be healing well, has 2 ulcers to medial aspect of right heel, 1 of which was reported to have purulence, not evident on my exam..  Mild surrounding erythema present, no significant tenderness. Neurologic: No facial asymmetry, moves extremity spontaneously, good and equal grip strength bilaterally, speech is quite slurred and hard to understand, patient reports he is handicapped, this is his baseline. Psychiatric: Normal judgment and insight.  Awake and alert.  Oriented x 3       Labs on Admission: I have personally reviewed following labs and imaging studies  CBC: Recent Labs  Lab 02/17/24 1448  WBC 4.8  NEUTROABS 3.4  HGB 12.6*  HCT 38.8*  MCV 81.5  PLT 364   Basic Metabolic Panel: Recent Labs  Lab 02/17/24 1448  NA 135  K 3.9  CL 94*  CO2 20*  GLUCOSE 267*  BUN 51*  CREATININE 4.82*  CALCIUM  8.7*   GFR: Estimated Creatinine Clearance: 16.1 mL/min (A) (by C-G formula based on SCr of 4.82 mg/dL (H)). Liver Function Tests: Recent Labs  Lab 02/17/24 1448  AST 19  ALT 20  ALKPHOS 77  BILITOT 0.7  PROT 7.9  ALBUMIN  3.6   Coagulation Profile: Recent Labs  Lab 02/17/24 1448  INR 1.1   Radiological Exams on Admission: DG Chest Port 1  View Result Date: 02/17/2024 CLINICAL DATA:  Weakness, purulent discharge from surgical wound on foot. EXAM: PORTABLE CHEST 1 VIEW COMPARISON:  01/16/2024. FINDINGS: Trachea is midline. Heart size normal. Thoracic aorta is calcified. Minimal streaky atelectasis or scarring in the left lung base. No pleural fluid. IMPRESSION: Minimal streaky atelectasis or scarring in the left lung base. Electronically Signed   By: Newell Eke M.D.   On: 02/17/2024 16:04    EKG: Independently reviewed.  Sinus tachycardia rate 133, QTc 454.  No significant change from prior.  Assessment/Plan Principal Problem:   Diabetic foot infection (HCC) Active Problems:   AKI (acute kidney injury)   Gastroenteritis   Hypertension   Gout   Intellectual disability   Type 2 diabetes with nephropathy (HCC)   Chronic kidney disease, stage III (moderate) (HCC)   History of transmetatarsal amputation of right foot (HCC)   Lactic acidosis  Assessment and Plan:  Diabetic foot infection/sepsis- has 2 wounds to the medial aspect of right heel, 1 of which had purulent discharge.  Recent first ray amputation to right foot, by Dr. Harden appears to be healing well.  X-ray of the right foot no suggestion of osteomyelitis.   - Meeting severe sepsis criteria with tachycardia heart rate 130s, and tachypnea respiratory 12-29, with evidence of endorgan dysfunction, lactic acidosis of 2.8> 2.5 - Will obtain MRI of the right foot- (please see detailed report) in summary-large complex abscess, osteomyelitis involving the medial cuneiform & calculous, diffuse cellulitis myofascitis, small tibiotalar joint effusion suspicious for septic arthritis. - Pharmacy recommending IV linezolid  and Unasyn , started. - Follow-up blood cultures - UA pending -Transfer to [facility] - I have reached out to Dr. Harden, via secure chat, he will see patient in the morning. - N.p.o. midnight  AKI on CKD 3B-creatinine 4.82, baseline 2-2.3.  Likely prerenal. -  1.5 L bolus given, continue N/s 100cc/hr x 20hrs  Gastroenteritis type symptoms-presenting with vomiting and diarrhea of 1 day duration.  AKI and lactic acidosis of 2.8.  Reports abdominal ache, but exam is benign.  Afebrile.  No leukocytosis, WBC 4.8.  COVID influenza RSV negative. - Hydrate for now - Stool C. Difficile, GI pathogen panel - Low threshold to obtain abdominal imaging  Anion gap metabolic acidosis-anion gap of 21, serum bicarb of 20.  Likely from lactic acidosis of 2.8.  Reports compliance with insulins.  Blood sugars 267. - Obtain BHB-unremarkable at 0.52. - Trend lactic acid  Hypertension-stable. - Resume metoprolol , hydralazine - pending med rec  Diabetes mellitus-uncontrolled.  A1c 01/17/2024-9.8. -Holding PTA 15u Lantus  while NPO - SSI- M q4h - Hold PTA glimepiride  Diffuse erythematous rash-not itchy, monitor for now.  Present prior to admission.   DVT prophylaxis: Heparin  Code Status: FULL Family Communication: None at bedside Disposition Plan: > 2 days Consults called: None  Admission status: Inpt tele  I certify that at the point of admission it is my clinical judgment that the patient will require inpatient hospital care spanning beyond 2 midnights from the point of admission due to high intensity of service, high risk for further deterioration and high frequency of surveillance required.    Author: Tully FORBES Carwin, MD 02/17/2024 9:48 PM  For on call review [email or web address].      [1]  Allergies Allergen Reactions   Ivp Dye [Iodinated Contrast Media] Other (See Comments)  Speed heart rate up    Penicillins Rash    Pt tolerated amoxicillin  oral challenge   Tetanus Toxoid-Containing Vaccines Rash   "

## 2024-02-18 ENCOUNTER — Encounter (HOSPITAL_COMMUNITY): Payer: Self-pay | Admitting: Internal Medicine

## 2024-02-18 ENCOUNTER — Other Ambulatory Visit (HOSPITAL_COMMUNITY): Payer: Self-pay | Admitting: *Deleted

## 2024-02-18 DIAGNOSIS — B9562 Methicillin resistant Staphylococcus aureus infection as the cause of diseases classified elsewhere: Secondary | ICD-10-CM | POA: Diagnosis present

## 2024-02-18 DIAGNOSIS — M009 Pyogenic arthritis, unspecified: Secondary | ICD-10-CM | POA: Diagnosis present

## 2024-02-18 DIAGNOSIS — R7881 Bacteremia: Secondary | ICD-10-CM | POA: Diagnosis present

## 2024-02-18 LAB — BLOOD CULTURE ID PANEL (REFLEXED) - BCID2

## 2024-02-18 LAB — CBC
HCT: 29.2 % — ABNORMAL LOW (ref 39.0–52.0)
Hemoglobin: 9.5 g/dL — ABNORMAL LOW (ref 13.0–17.0)
MCH: 26.8 pg (ref 26.0–34.0)
MCHC: 32.5 g/dL (ref 30.0–36.0)
MCV: 82.3 fL (ref 80.0–100.0)
Platelets: 273 K/uL (ref 150–400)
RBC: 3.55 MIL/uL — ABNORMAL LOW (ref 4.22–5.81)
RDW: 14.9 % (ref 11.5–15.5)
WBC: 2.7 K/uL — ABNORMAL LOW (ref 4.0–10.5)
nRBC: 0 % (ref 0.0–0.2)

## 2024-02-18 LAB — BASIC METABOLIC PANEL WITH GFR
Anion gap: 11 (ref 5–15)
BUN: 50 mg/dL — ABNORMAL HIGH (ref 8–23)
CO2: 26 mmol/L (ref 22–32)
Calcium: 7.9 mg/dL — ABNORMAL LOW (ref 8.9–10.3)
Chloride: 99 mmol/L (ref 98–111)
Creatinine, Ser: 4.53 mg/dL — ABNORMAL HIGH (ref 0.61–1.24)
GFR, Estimated: 13 mL/min — ABNORMAL LOW
Glucose, Bld: 128 mg/dL — ABNORMAL HIGH (ref 70–99)
Potassium: 3.7 mmol/L (ref 3.5–5.1)
Sodium: 136 mmol/L (ref 135–145)

## 2024-02-18 LAB — GLUCOSE, CAPILLARY
Glucose-Capillary: 141 mg/dL — ABNORMAL HIGH (ref 70–99)
Glucose-Capillary: 156 mg/dL — ABNORMAL HIGH (ref 70–99)
Glucose-Capillary: 218 mg/dL — ABNORMAL HIGH (ref 70–99)
Glucose-Capillary: 138 mg/dL — ABNORMAL HIGH (ref 70–99)

## 2024-02-18 LAB — C DIFFICILE QUICK SCREEN W PCR REFLEX
C Diff antigen: POSITIVE — AB
C Diff toxin: NEGATIVE

## 2024-02-18 LAB — LACTIC ACID, PLASMA: Lactic Acid, Venous: 1.4 mmol/L (ref 0.5–1.9)

## 2024-02-18 MED ORDER — SODIUM CHLORIDE 0.9 % IV SOLN: INTRAVENOUS | Status: DC

## 2024-02-18 MED ORDER — BENZONATATE 100 MG PO CAPS
100.0000 mg | ORAL_CAPSULE | Freq: Three times a day (TID) | ORAL | Status: DC
  Administered 2024-02-19 – 2024-02-28 (×27): 100 mg via ORAL
  Filled 2024-02-18 (×29): qty 1

## 2024-02-18 MED ORDER — INSULIN ASPART 100 UNIT/ML IJ SOLN
3.0000 [IU] | Freq: Three times a day (TID) | INTRAMUSCULAR | Status: DC
  Administered 2024-02-18 – 2024-02-27 (×17): 3 [IU] via SUBCUTANEOUS
  Filled 2024-02-18 (×8): qty 3
  Filled 2024-02-18: qty 1
  Filled 2024-02-18 (×4): qty 3
  Filled 2024-02-18: qty 1
  Filled 2024-02-18 (×6): qty 3

## 2024-02-18 MED ORDER — LOPERAMIDE HCL 2 MG PO CAPS
2.0000 mg | ORAL_CAPSULE | ORAL | Status: DC | PRN
  Administered 2024-02-18 – 2024-02-21 (×2): 2 mg via ORAL
  Filled 2024-02-18 (×2): qty 1

## 2024-02-18 MED ORDER — METOPROLOL SUCCINATE ER 50 MG PO TB24
50.0000 mg | ORAL_TABLET | Freq: Every day | ORAL | Status: DC
  Administered 2024-02-18 – 2024-02-28 (×11): 50 mg via ORAL
  Filled 2024-02-18 (×11): qty 1

## 2024-02-18 MED ORDER — ALBUTEROL SULFATE (2.5 MG/3ML) 0.083% IN NEBU: 2.5000 mg | INHALATION_SOLUTION | Freq: Four times a day (QID) | RESPIRATORY_TRACT | Status: DC | PRN

## 2024-02-18 NOTE — Progress Notes (Signed)
 " PROGRESS NOTE   [name]  FMW:[id number] DOB: 1950-04-01 DOA: 02/17/2024 PCP: Marvine Rush, MD   Chief Complaint  Patient presents with   Post-op Problem   Level of care: Telemetry  Brief Admission History:  74 y.o. 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  Patient with baseline slurred speech, hard to understand.  He presented to the ED with complaints of generalized weakness, nausea and vomiting and diarrhea that started last night.  Reports a mild ache to his mid upper abdomen.  Reports about 4 episodes of vomiting with loose stools.  No fevers no chills.  No urinary symptoms.  Reports compliance with his insulin .  No chest pain no difficulty breathing.   Recent hospitalization 12/2 to 12/10 for sepsis 2/2 osteomyelitis of the first distal metatarsal.  Treated with IV Vanco and ceftriaxone , Ortho consulted, underwent first ray amputation by Dr. Harden 12/3.  MRI of right foot with findings suggesting septic arthritis, osteomyelitis of the medial cuneiform and calcaneus, diffuse cellulitis/mild fasciitis and abscess of the right foot.  Dr. Harden was consulted and will see patient when he arrives at [facility].  He is currently waiting for bed at [facility].   Assessment and Plan:  Severe sepsis secondary to right diabetic foot infection  -- lactic acidosis trending down with treatments -- continue supportive measures  Gram positive cocci bacteremia -- continue current IV antibiotics -- repeat cultures  -- follow up ID and sensitivities -- TTE pending  Right foot septic arthritis  Osteomyelitis of medium cuneiform and calcaneus the right foot Diffuse cellulitis/mild fasciitis and abscess right foot -- consultation with Dr. Harden pending transfer to [facility] -- continue IV antibiotics and supportive care for now -- pt likely to require surgical management for source infection  control   Type 2 diabetes mellitus with neurological complications, uncontrolled with hyperglycemia -- added prandial coverage -- continue basal insulin  and SSI coverage with frequent CBG monitoring -- A1c is 9.8% which is poorly controlled disease   AKI on CKD stage 3b -- likely prerenal given he is clinically dry appearing -- agree with IV fluid hydration -- follow BMP  -- avoid nephrotoxins   Gastroenteritis -- he presented with 1 day of vomiting and diarrhea prior to arrival -- GI path pending -- supportive care  -- symptom management   Diffuse macular rash  -- only with mild symptoms at this time -- supportive treatments  -- monitor for worsening rash or symptoms  Essential hypertension  -- while awaiting home meds to be reconciled I have started metoprolol  succinate 50 mg daily with hold parameters given soft BPs  Sinus Tachycardia -- suspect beta blocker rebound tachycardia -- resumed metoprolol  succinate at 50 mg while still waiting on home meds to be reconciled, additional request sent today for home meds to be reconciled by pharmacy  DVT prophylaxis: heparin  Code Status: Full  Family Communication:  Disposition: awaiting transfer to [facility]    Consultants:  Dr. Harden extremity surgeon  Procedures:   Antimicrobials:  Amp/sulbactam 1/3>> Linezolid  1/3>>   Subjective: Pt having no specific complaints today.   Objective: Vitals:   02/17/24 2355 02/18/24 0458 02/18/24 1008 02/18/24 1022  BP: (!) 109/51 116/63 (!) 111/45 111/64  Pulse: (!) 112 (!) 118 (!) 114 (!) 102  Resp:    19  Temp: 98.6 F (37 C) 98.3 F (36.8 C) 98.1 F (36.7 C) 98.1 F (36.7 C)  TempSrc: Oral Oral Oral Oral  SpO2: 100% 100% 90% 93%  Weight:      Height:        Intake/Output Summary (Last 24 hours) at 02/18/2024 1155 Last data filed at 02/18/2024 0837 Gross per 24 hour  Intake 1340 ml  Output --  Net 1340 ml   Filed Weights   02/17/24 1420  Weight: 90.2 kg    Examination:  General exam: Appears calm and comfortable  Respiratory system: Clear to auscultation. Respiratory effort normal. Cardiovascular system: normal S1 & S2 heard. No JVD, murmurs, rubs, gallops or clicks. No pedal edema. Gastrointestinal system: Abdomen is nondistended, soft and nontender. No organomegaly or masses felt. Normal bowel sounds heard. Central nervous system: Alert and oriented. No focal neurological deficits. Extremities: right foot stump with open wounds and purulent drainage seen.  Skin: No rashes, lesions or ulcers. Psychiatry: Judgement and insight appear diminished. Mood & affect appropriate.   Data Reviewed: I have personally reviewed following labs and imaging studies  CBC: Recent Labs  Lab 02/17/24 1448 02/18/24 0513  WBC 4.8 2.7*  NEUTROABS 3.4  --   HGB 12.6* 9.5*  HCT 38.8* 29.2*  MCV 81.5 82.3  PLT 364 273    Basic Metabolic Panel: Recent Labs  Lab 02/17/24 1448 02/18/24 0513  NA 135 136  K 3.9 3.7  CL 94* 99  CO2 20* 26  GLUCOSE 267* 128*  BUN 51* 50*  CREATININE 4.82* 4.53*  CALCIUM  8.7* 7.9*    CBG: Recent Labs  Lab 02/17/24 2133 02/17/24 2354 02/18/24 0402 02/18/24 0731 02/18/24 1104  GLUCAP 205* 204* 141* 156* 218*    Recent Results (from the past 240 hours)  Resp panel by RT-PCR (RSV, Flu A&B, Covid) Anterior Nasal Swab     Status: None   Collection Time: 02/17/24  2:21 PM   Specimen: Anterior Nasal Swab  Result Value Ref Range Status   SARS Coronavirus 2 by RT PCR NEGATIVE NEGATIVE Final    Comment: (NOTE) SARS-CoV-2 target nucleic acids are NOT DETECTED.  The SARS-CoV-2 RNA is generally detectable in upper respiratory specimens during the acute phase of infection. The lowest concentration of SARS-CoV-2 viral copies this assay can detect is 138 copies/mL. A negative result does not preclude SARS-Cov-2 infection and should not be used as the sole basis for treatment or other patient management decisions. A  negative result may occur with  improper specimen collection/handling, submission of specimen other than nasopharyngeal swab, presence of viral mutation(s) within the areas targeted by this assay, and inadequate number of viral copies(<138 copies/mL). A negative result must be combined with clinical observations, patient history, and epidemiological information. The expected result is Negative.  Fact Sheet for Patients:  bloggercourse.com  Fact Sheet for Healthcare Providers:  seriousbroker.it  This test is no t yet approved or cleared by the [place]  FDA and  has been authorized for detection and/or diagnosis of SARS-CoV-2 by FDA under an Emergency Use Authorization (EUA). This EUA will remain  in effect (meaning this test can be used) for the duration of the COVID-19 declaration under Section 564(b)(1) of the Act, 21 U.S.C.section 360bbb-3(b)(1), unless the authorization is terminated  or revoked sooner.       Influenza A by PCR NEGATIVE NEGATIVE Final   Influenza B by PCR NEGATIVE NEGATIVE Final    Comment: (NOTE) The Xpert Xpress SARS-CoV-2/FLU/RSV plus assay is intended as an aid in the diagnosis of influenza from Nasopharyngeal swab specimens and should not be used as  a sole basis for treatment. Nasal washings and aspirates are unacceptable for Xpert Xpress SARS-CoV-2/FLU/RSV testing.  Fact Sheet for Patients: bloggercourse.com  Fact Sheet for Healthcare Providers: seriousbroker.it  This test is not yet approved or cleared by the [place]  FDA and has been authorized for detection and/or diagnosis of SARS-CoV-2 by FDA under an Emergency Use Authorization (EUA). This EUA will remain in effect (meaning this test can be used) for the duration of the COVID-19 declaration under Section 564(b)(1) of the Act, 21 U.S.C. section 360bbb-3(b)(1), unless the authorization  is terminated or revoked.     Resp Syncytial Virus by PCR NEGATIVE NEGATIVE Final    Comment: (NOTE) Fact Sheet for Patients: bloggercourse.com  Fact Sheet for Healthcare Providers: seriousbroker.it  This test is not yet approved or cleared by the [place]  FDA and has been authorized for detection and/or diagnosis of SARS-CoV-2 by FDA under an Emergency Use Authorization (EUA). This EUA will remain in effect (meaning this test can be used) for the duration of the COVID-19 declaration under Section 564(b)(1) of the Act, 21 U.S.C. section 360bbb-3(b)(1), unless the authorization is terminated or revoked.  Performed at [facility], [address], [place], [place] [address]   Blood Culture (routine x 2)     Status: None (Preliminary result)   Collection Time: 02/17/24  2:48 PM   Specimen: BLOOD LEFT ARM  Result Value Ref Range Status   Specimen Description BLOOD LEFT ARM BOTTLES DRAWN AEROBIC AND ANAEROBIC  Final   Special Requests Blood Culture adequate volume  Final   Culture  Setup Time   Final    GRAM POSITIVE COCCI ANAEROBIC BOTTLE ONLY Gram Stain Report Called to,Read Back By and Verified With: KYM ISLAND ON 02/18/2024 @10 :13AM BY IVAR ECHEVARIA  Performed at [facility], [address] [place], [place] [address]    Culture GRAM POSITIVE COCCI  Final   Report Status PENDING  Incomplete  Blood Culture (routine x 2)     Status: None (Preliminary result)   Collection Time: 02/17/24  2:50 PM   Specimen: BLOOD LEFT WRIST  Result Value Ref Range Status   Specimen Description   Final    BLOOD LEFT WRIST BOTTLES DRAWN AEROBIC AND ANAEROBIC   Special Requests   Final    Blood Culture adequate volume Performed at [facility], [address], [place], [place] [address]    Culture PENDING  Incomplete   Report Status PENDING  Incomplete     Radiology Studies: MR FOOT RIGHT WO CONTRAST Result Date: 02/17/2024 CLINICAL DATA:  Open  wound involving the medial aspect of the hindfoot. EXAM: MRI OF THE RIGHT FOREFOOT WITHOUT CONTRAST TECHNIQUE: Multiplanar, multisequence MR imaging of the right foot was performed. No intravenous contrast was administered. COMPARISON:  Radiographs, same date. FINDINGS: Prior mid distal amputations of the second through fifth toes. Prior amputation of the first ray. Abnormal T1 and T2 signal intensity in the medial cuneiform consistent with osteomyelitis. There is also an adjacent complex fluid collection measuring 3.2 x 2.4 x 2.1 cm and consistent with an abscess. I do not see any definite findings for osteomyelitis involving the other cuneiform is or metatarsals. Open wound noted along the medial aspect of the hindfoot with an underlying large complex abscess involving the subcutaneous tissues and also the underlying musculature. This measures approximately 5.1 x 3.9 x 2.9 cm. Adjacent diffuse osteomyelitis involving the calcaneus. Diffuse cellulitis and myofasciitis. Small tibiotalar joint effusion but no findings suspicious for septic arthritis. The subtalar  joints are maintained. Severe chronic Achilles tendinopathy. The plantar fascia is surrounded by abscess and is torn IMPRESSION: 1. Open wound along the medial aspect of the hindfoot with an underlying large complex abscess involving the subcutaneous tissues and also the underlying musculature. 2. Osteomyelitis involving the medial cuneiform and the calcaneus. 3. Diffuse cellulitis and myofasciitis. 4. Small tibiotalar joint effusion but no findings suspicious for septic arthritis. 5. Severe chronic Achilles tendinopathy. Electronically Signed   By: MYRTIS Stammer M.D.   On: 02/17/2024 19:08   DG Foot Complete Right Result Date: 02/17/2024 CLINICAL DATA:  Purulent discharge from surgical wound. EXAM: RIGHT FOOT COMPLETE - 3+ VIEW COMPARISON:  01/16/2024. FINDINGS: First ray amputation with additional amputations of the second through fifth rays at the  metatarsal necks. Soft tissue wound overlies the distal second metatarsal. No osseous erosion to suggest osteomyelitis. No radiopaque foreign body. Prominent calcaneal spurs. IMPRESSION: 1. Soft tissue wound over the distal second metatarsal. No underlying cortical erosion to suggest osteomyelitis. 2. First through fifth ray amputations, as detailed above. Electronically Signed   By: Newell Eke M.D.   On: 02/17/2024 16:06   DG Chest Port 1 View Result Date: 02/17/2024 CLINICAL DATA:  Weakness, purulent discharge from surgical wound on foot. EXAM: PORTABLE CHEST 1 VIEW COMPARISON:  01/16/2024. FINDINGS: Trachea is midline. Heart size normal. Thoracic aorta is calcified. Minimal streaky atelectasis or scarring in the left lung base. No pleural fluid. IMPRESSION: Minimal streaky atelectasis or scarring in the left lung base. Electronically Signed   By: Newell Eke M.D.   On: 02/17/2024 16:04    Scheduled Meds:  heparin   5,000 Units Subcutaneous Q8H   insulin  aspart  0-15 Units Subcutaneous Q4H   insulin  aspart  3 Units Subcutaneous TID WC   insulin  glargine-yfgn  10 Units Subcutaneous QHS   LORazepam   0.5 mg Intravenous Once   Continuous Infusions:  sodium chloride  125 mL/hr at 02/18/24 1017   ampicillin -sulbactam (UNASYN ) IV 3 g (02/18/24 0815)   linezolid  (ZYVOX ) IV 600 mg (02/18/24 1004)     LOS: 1 day   Time spent: 58 mins  Vernon Ariel Vicci, MD How to contact the [facility] Attending or Consulting provider 7A - 7P or covering provider during after hours 7P -7A, for this patient?  Check the care team in [facility] and look for a) attending/consulting TRH provider listed and b) the TRH team listed Log into [email or web address] to find provider on call.  Locate the TR[facility] provider you are looking for under [facility] and page to a number that you can be directly reached. If you still have difficulty reaching the provider, please page the [facility] (Director on Call) for the Hospitalists listed on amion  for assistance.  02/18/2024, 11:55 AM    "

## 2024-02-18 NOTE — Progress Notes (Signed)
 Lab called with critical blood culture 1st bottle gram positive cocci MD made aware

## 2024-02-18 NOTE — Plan of Care (Signed)
" °  Problem: Education: Goal: Knowledge of General Education information will improve Description: Including pain rating scale, medication(s)/side effects and non-pharmacologic comfort measures Outcome: Progressing   Problem: Health Behavior/Discharge Planning: Goal: Ability to manage health-related needs will improve Outcome: Progressing   Problem: Clinical Measurements: Goal: Ability to maintain clinical measurements within normal limits will improve Outcome: Progressing   Problem: Elimination: Goal: Will not experience complications related to bowel motility Outcome: Progressing   Problem: Pain Managment: Goal: General experience of comfort will improve and/or be controlled Outcome: Progressing   Problem: Education: Goal: Ability to describe self-care measures that may prevent or decrease complications (Diabetes Survival Skills Education) will improve Outcome: Progressing   Problem: Coping: Goal: Ability to adjust to condition or change in health will improve Outcome: Progressing   "

## 2024-02-18 NOTE — Hospital Course (Addendum)
 The patient is a 74 year old with history of CVA with residual slurred speech, CKD 3 AA, chronic osteomyelitis s/p right TMA, HTN, DM 2 admitted to the hospital for generally feeling sick and having nausea/vomiting.  Workup showed concerns of sepsis with worsening right diabetic foot infection with abscess.  Dr. Harden was consulted and patient was transferred from [facility] to [facility] for surgical intervention.  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  Hospital course complicated by gram-positive bacteremia, echocardiogram unremarkable. Underwent TEE and showed no Vegetation. ID recommending transitioning to po Abx on 1/12 for 3 more weeks.   Assessment and Plan:  Sepsis secondary to right diabetic foot infection associated with abscess and osteomyelitis Gram-positive bacteremia -Orthopedic consulted.  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  Postop recommendations by their service. -ID team was following -Repeat blood cultures 1/5 - NGTD -Having intermittent Temperatures and had another Temperature this AM; WBC is not elevated.  -Echocardiogram showing preserved EF 65%, grade 1 DD.  No evidence of vegetation -TEE done and showed no Vegetation -ID recommends continuing IV Daptomycin  and then transition to linezolid  on 1/12 to finish 3 more weeks with end date being 03/18/2024 -CXR done and showed Patchy left lower lobe opacity, possibly representing atelectasis and/or infection. Will Repeat in the AM -PT/OT recommending SNF and likely will be Monday as we will transition him from IV antibiotics to oral antibiotics at that time  Normocytic Anemia: Combination of delusional and likely postsurgical blood loss.  Closely monitor.  Transfuse hemoglobin less than 7. Hgb/Hct Trend:  Recent Labs  Lab 02/19/24 0241 02/20/24 0009 02/22/24 0216 02/23/24 0008 02/23/24 0535 02/24/24 0456 02/25/24 0541  HGB 9.7* 8.7* 7.6* 7.5* 7.3* 8.9* 8.4*  HCT 29.6* 26.0* 22.8* 23.1* 22.1* 26.7* 25.2*  MCV 82.0  80.5 80.3 83.1 82.2 81.7 81.6  -Checked Anemia Panel and showed Iron level 14, UIBC 189, TIBC 203, saturation ration 7%, ferritin level 307, folate level 7.3. CTM for S/Sx of Bleeding; No overt bleeding noted. Repeat CBC in the AM    Type 2 Diabetes Mellitus with neurological complications, uncontrolled with Hyperglycemia -A1c 9.8.  Sliding scale and Accu-Chek.  Will adjust as necessary. CBG Trend:  Recent Labs  Lab 02/24/24 1133 02/24/24 1528 02/24/24 2126 02/25/24 0751 02/25/24 1133 02/25/24 1544 02/25/24 1943  GLUCAP 179* 161* 135* 111* 148* 234* 318*    AKI on CKD stage 3b / Metabolic Acidosis: Baseline creatinine around 2.0, admission creatinine 4.82 with IV fluids renal function is slowly improving, creatinine 3.42. BUN/Cr Trend: Recent Labs  Lab 02/19/24 0241 02/20/24 0009 02/21/24 0515 02/22/24 0216 02/23/24 0535 02/24/24 0456 02/25/24 0541  BUN 52* 54* 49* 45* 39* 44* 56*  CREATININE 4.33* 3.80* 3.49* 3.42* 3.46* 3.29* 3.56*  -Now improved and CO2 is 22, AG is 9, Chloride Level is 104  -Renal ultrasound does not show acute pathology; Given a dose of IV Lasix  40 mg x1 the day before yesterday and will give him another dose  -Avoid Nephrotoxic Medications, Contrast Dyes, Hypotension and Dehydration to Ensure Adequate Renal Perfusion and will need to Renally Adjust Meds. CTM & Trend Renal Function carefully and repeat CMP in the AM    Gastroenteritis:  Improved. C. difficile antigen positive per toxigenic PCR is negative.  GI panel is neg. CTM for S/Sx     Diffuse Macular Rash: Unclear etiology.  Patient thinks it is getting worse.  Will try Solu-Medrol  1.5 mg IV once.  Add Benadryl .  Will need outpatient follow-up with dermatology  Essential Hypertension  Sinus Tachycardia -Toprol -XL IV as needed. CTM BP per Protocol. Last BP reading was 169/73  Hypomagnesemia/Hypokalemia: K+ and Mag Level Trend:  Recent Labs  Lab 02/19/24 0241 02/20/24 0009 02/20/24 0009  02/21/24 0515 02/22/24 0216 02/23/24 0535 02/24/24 0456 02/25/24 0541  K 3.4* 3.6  --  4.1 4.0 3.8 3.9 4.4  MG  --  1.5*   < > 1.7 1.5* 1.5* 1.9 1.9   < > = values in this interval not displayed.  -Replete w/ IV Mag Sulfate 2 grams. CTM and Replete as Necessary. Repeat CMP and Mag in the AM   Hypoalbuminemia: Patient's Albumin Lvl 3.6 -> 2.3 x2. CTM & Trend a& Repeat CMP in the AM

## 2024-02-18 NOTE — Progress Notes (Signed)
 Report has been called to receiving nurse on [id number]. Ronal has been called to inform of the move to [place]. Tele will be removed before leaving the floor. [facility] link has been called and is on the way for transport.

## 2024-02-18 NOTE — Progress Notes (Addendum)
 Lab called 1/4 gram positive cocci and identified as MRSA. MD made aware

## 2024-02-18 NOTE — Progress Notes (Signed)
 Nurse at bedside. Tech reported to me multiple loose stool episodes. MD was made aware and new orders for PRN were put in. Patient is presented to me in the bed watching TV. Per NT taylor there was not enough stool to collect a sample will continue to try to retrieve that when possible. We are continuing to care as care plan is ordered. No questions or concerns were brought to my attention by the patient at this time.

## 2024-02-18 NOTE — Plan of Care (Signed)

## 2024-02-19 ENCOUNTER — Inpatient Hospital Stay (HOSPITAL_COMMUNITY)

## 2024-02-19 DIAGNOSIS — E11628 Type 2 diabetes mellitus with other skin complications: Secondary | ICD-10-CM

## 2024-02-19 DIAGNOSIS — R112 Nausea with vomiting, unspecified: Secondary | ICD-10-CM | POA: Diagnosis not present

## 2024-02-19 DIAGNOSIS — M86271 Subacute osteomyelitis, right ankle and foot: Secondary | ICD-10-CM

## 2024-02-19 DIAGNOSIS — R7881 Bacteremia: Secondary | ICD-10-CM

## 2024-02-19 DIAGNOSIS — B9562 Methicillin resistant Staphylococcus aureus infection as the cause of diseases classified elsewhere: Secondary | ICD-10-CM | POA: Diagnosis not present

## 2024-02-19 DIAGNOSIS — M86172 Other acute osteomyelitis, left ankle and foot: Secondary | ICD-10-CM | POA: Diagnosis not present

## 2024-02-19 DIAGNOSIS — L089 Local infection of the skin and subcutaneous tissue, unspecified: Secondary | ICD-10-CM | POA: Diagnosis not present

## 2024-02-19 DIAGNOSIS — N1831 Chronic kidney disease, stage 3a: Secondary | ICD-10-CM

## 2024-02-19 DIAGNOSIS — E1121 Type 2 diabetes mellitus with diabetic nephropathy: Secondary | ICD-10-CM | POA: Diagnosis not present

## 2024-02-19 LAB — BASIC METABOLIC PANEL WITH GFR
Anion gap: 11 (ref 5–15)
BUN: 52 mg/dL — ABNORMAL HIGH (ref 8–23)
CO2: 22 mmol/L (ref 22–32)
Calcium: 7.8 mg/dL — ABNORMAL LOW (ref 8.9–10.3)
Chloride: 103 mmol/L (ref 98–111)
Creatinine, Ser: 4.33 mg/dL — ABNORMAL HIGH (ref 0.61–1.24)
GFR, Estimated: 14 mL/min — ABNORMAL LOW
Glucose, Bld: 103 mg/dL — ABNORMAL HIGH (ref 70–99)
Potassium: 3.4 mmol/L — ABNORMAL LOW (ref 3.5–5.1)
Sodium: 136 mmol/L (ref 135–145)

## 2024-02-19 LAB — CBC WITH DIFFERENTIAL/PLATELET
Abs Immature Granulocytes: 0.04 K/uL (ref 0.00–0.07)
Basophils Absolute: 0 K/uL (ref 0.0–0.1)
Basophils Relative: 0 %
Eosinophils Absolute: 0 K/uL (ref 0.0–0.5)
Eosinophils Relative: 2 %
HCT: 29.6 % — ABNORMAL LOW (ref 39.0–52.0)
Hemoglobin: 9.7 g/dL — ABNORMAL LOW (ref 13.0–17.0)
Immature Granulocytes: 2 %
Lymphocytes Relative: 27 %
Lymphs Abs: 0.7 K/uL (ref 0.7–4.0)
MCH: 26.9 pg (ref 26.0–34.0)
MCHC: 32.8 g/dL (ref 30.0–36.0)
MCV: 82 fL (ref 80.0–100.0)
Monocytes Absolute: 0.1 K/uL (ref 0.1–1.0)
Monocytes Relative: 4 %
Neutro Abs: 1.7 K/uL (ref 1.7–7.7)
Neutrophils Relative %: 65 %
Platelets: 257 K/uL (ref 150–400)
RBC: 3.61 MIL/uL — ABNORMAL LOW (ref 4.22–5.81)
RDW: 14.9 % (ref 11.5–15.5)
WBC: 2.7 K/uL — ABNORMAL LOW (ref 4.0–10.5)
nRBC: 0 % (ref 0.0–0.2)

## 2024-02-19 LAB — ECHOCARDIOGRAM COMPLETE
Area-P 1/2: 4.39 cm2
Height: 74 in
S' Lateral: 3.1 cm
Weight: 3182.4 [oz_av]

## 2024-02-19 LAB — GLUCOSE, CAPILLARY
Glucose-Capillary: 103 mg/dL — ABNORMAL HIGH (ref 70–99)
Glucose-Capillary: 95 mg/dL (ref 70–99)
Glucose-Capillary: 142 mg/dL — ABNORMAL HIGH (ref 70–99)
Glucose-Capillary: 145 mg/dL — ABNORMAL HIGH (ref 70–99)
Glucose-Capillary: 150 mg/dL — ABNORMAL HIGH (ref 70–99)
Glucose-Capillary: 142 mg/dL — ABNORMAL HIGH (ref 70–99)
Glucose-Capillary: 164 mg/dL — ABNORMAL HIGH (ref 70–99)

## 2024-02-19 LAB — URINALYSIS, W/ REFLEX TO CULTURE (INFECTION SUSPECTED)
Bilirubin Urine: NEGATIVE
Glucose, UA: NEGATIVE mg/dL
Ketones, ur: NEGATIVE mg/dL
Nitrite: NEGATIVE
Protein, ur: 100 mg/dL — AB
RBC / HPF: >50 RBC/hpf (ref 0–5)
Specific Gravity, Urine: 1.018 (ref 1.005–1.030)
pH: 5 (ref 5.0–8.0)

## 2024-02-19 LAB — GASTROINTESTINAL PANEL BY PCR, STOOL (REPLACES STOOL CULTURE)

## 2024-02-19 LAB — RESPIRATORY PANEL BY PCR

## 2024-02-19 LAB — CLOSTRIDIUM DIFFICILE BY PCR, REFLEXED
Hypervirulent Strain: NEGATIVE
Toxigenic C. Difficile by PCR: NEGATIVE

## 2024-02-19 MED ORDER — HYDRALAZINE HCL 20 MG/ML IJ SOLN: 10.0000 mg | INTRAMUSCULAR | Status: DC | PRN

## 2024-02-19 MED ORDER — IPRATROPIUM-ALBUTEROL 0.5-2.5 (3) MG/3ML IN SOLN: 3.0000 mL | RESPIRATORY_TRACT | Status: DC | PRN

## 2024-02-19 MED ORDER — SENNOSIDES-DOCUSATE SODIUM 8.6-50 MG PO TABS: 1.0000 | ORAL_TABLET | Freq: Every evening | ORAL | Status: DC | PRN

## 2024-02-19 MED ORDER — GUAIFENESIN 100 MG/5ML PO LIQD
5.0000 mL | ORAL | Status: DC | PRN
  Administered 2024-02-21 (×2): 5 mL via ORAL
  Filled 2024-02-19 (×4): qty 10

## 2024-02-19 MED ORDER — DAPTOMYCIN-SODIUM CHLORIDE 700-0.9 MG/100ML-% IV SOLN
8.0000 mg/kg | INTRAVENOUS | Status: DC
  Administered 2024-02-19: 700 mg via INTRAVENOUS
  Filled 2024-02-19 (×2): qty 100

## 2024-02-19 MED ORDER — SODIUM CHLORIDE 0.9 % IV SOLN: INTRAVENOUS | Status: DC

## 2024-02-19 MED ORDER — METOPROLOL TARTRATE 5 MG/5ML IV SOLN: 5.0000 mg | INTRAVENOUS | Status: DC | PRN

## 2024-02-19 MED ORDER — GLUCAGON HCL RDNA (DIAGNOSTIC) 1 MG IJ SOLR: 1.0000 mg | INTRAMUSCULAR | Status: DC | PRN

## 2024-02-19 NOTE — TOC Initial Note (Addendum)
 Transition of Care [facility]) - Initial/Assessment Note   Patient Details  Name: [name] MRN: [id number] Date of Birth: 07-09-1950  [facility]) CM/SW Contact:    Nena LITTIE Coffee, RN Phone Number: 02/19/2024, 5:35 PM  Clinical Narrative:                 Pt c/high readmission score. Discharged on 12/10 after hospitalization for sepsis 2/2 to osteomyelitis of right foot c/surgical intervention. Pt dc'd to [facility] for 2 weeks then returned home where he lives alone c/extended family checking in intermittently. Pt currently admitted c/sepsis, acute cellulitis and abscess right heel, MRSA bacteremia, n/v/d. GI pathogen panel is pending. Current plan is for right BKA on 02/21/2024.  Current DME: walker  Expected Discharge Plan: Skilled Nursing Facility Barriers to Discharge: Continued Medical Work up  Patient Goals and CMS Choice Patient states their goals for this hospitalization and ongoing recovery are:: Strengthening and return home ASAP   Expected Discharge Plan and Services In-house Referral: Clinical Social Work Discharge Planning Services: CM Consult   Living arrangements for the past 2 months: Single Family Home       Prior Living Arrangements/Services Living arrangements for the past 2 months: Single Family Home Lives with:: Self Patient language and need for interpreter reviewed:: Yes Do you feel safe going back to the place where you live?: Yes      Need for Family Participation in Patient Care: Yes (Comment) Care giver support system in place?: Yes (comment)   Criminal Activity/Legal Involvement Pertinent to Current Situation/Hospitalization: No - Comment as needed  Activities of Daily Living   ADL Screening (condition at time of admission) Independently performs ADLs?: No Does the patient have a NEW difficulty with bathing/dressing/toileting/self-feeding that is expected to last >3 days?: Yes (Initiates electronic notice to provider for possible OT  consult) Does the patient have a NEW difficulty with getting in/out of bed, walking, or climbing stairs that is expected to last >3 days?: Yes (Initiates electronic notice to provider for possible PT consult) Does the patient have a NEW difficulty with communication that is expected to last >3 days?: No Is the patient deaf or have difficulty hearing?: No Does the patient have difficulty seeing, even when wearing glasses/contacts?: No Does the patient have difficulty concentrating, remembering, or making decisions?: No  Permission Sought/Granted Permission sought to share information with : Case Manager, Magazine Features Editor, Family Supports Permission granted to share information with : Yes, Verbal Permission Granted  Emotional Assessment Appearance:: Appears stated age    Orientation: : Oriented to Self, Oriented to Place, Oriented to Situation Alcohol / Substance Use: Not Applicable Psych Involvement: No (comment)  Admission diagnosis:  AKI (acute kidney injury) [N17.9] Sepsis with acute renal failure without septic shock, due to unspecified organism, unspecified acute renal failure type (HCC) [A41.9, R65.20, N17.9] Diabetic foot infection (HCC) [Z88.371, L08.9] Patient Active Problem List   Diagnosis Date Noted   MRSA bacteremia 02/18/2024   Septic arthritis of right foot 02/18/2024   Gastroenteritis 02/17/2024   Diabetic foot infection (HCC) 02/17/2024   Malnutrition of moderate degree 01/24/2024   Depression with anxiety 01/16/2024   Acute ischemic stroke (HCC) 05/03/2023   H/O Hemorrhagic stroke 05/03/2023   Chronic osteomyelitis (HCC) 05/03/2023   Hyperglycemia 03/04/2023   Osteomyelitis of right foot (HCC) 03/01/2023   Lactic acidosis 03/01/2023   History of transmetatarsal amputation of right foot (HCC) 04/06/2022   Cutaneous abscess of right foot 02/04/2022   Osteomyelitis of great  toe of left foot (HCC)    Hyperlipidemia 11/30/2021   Bedbug bite with  infection 11/30/2021    Class: Acute   Cellulitis of right foot 11/12/2021   AKI (acute kidney injury) 11/12/2021   Sepsis (HCC) 11/11/2021   Amputated great toe of right foot 02/20/2019   Stage 3a chronic kidney disease (HCC) 02/13/2019   Acute osteomyelitis (HCC)    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/30/2019   Fatty liver 01/09/2019   Thrombocytopenia, unspecified 05/14/2013   Other specified abnormal findings of blood chemistry 05/14/2013   Colon adenoma 09/13/2012   Anxiety 09/06/2012   Intellectual disability 09/06/2012   Chest pain 09/06/2012   GERD (gastroesophageal reflux disease) 03/08/2012   Gout 09/06/2011   Hypertension 03/08/2011   Chronic kidney disease, stage III (moderate) (HCC) 11/30/2009   Urinary tract infection 11/30/2009   PCP:  Marvine Rush, MD Pharmacy:   [facility] [phone] - [place], [place] - [address] AT [facility] [address] [place] [address] [place] [address] Phone: [phone] Fax: [phone]  Social Drivers of Health (SDOH) Social History: SDOH Screenings   Food Insecurity: No Food Insecurity (02/17/2024)  Housing: Low Risk (02/17/2024)  Transportation Needs: No Transportation Needs (02/17/2024)  Utilities: Not At Risk (02/17/2024)  Financial Resource Strain: Low Risk (12/13/2021)  Social Connections: Socially Isolated (02/17/2024)  Tobacco Use: Low Risk (02/18/2024)   SDOH Interventions:   Readmission Risk Interventions    11/30/2021    2:51 PM  Readmission Risk Prevention Plan  Transportation Screening Complete  HRI or Home Care Consult Complete  Social Work Consult for Recovery Care Planning/Counseling Complete  Palliative Care Screening Not Applicable  Medication Review Oceanographer) Complete

## 2024-02-19 NOTE — H&P (View-Only) (Signed)
 "  ORTHOPAEDIC CONSULTATION  REQUESTING PHYSICIAN: Caleen Burgess BROCKS, MD  Chief Complaint: Cellulitis right heel.  HPI: [name] is a 74 y.o. male who presents with acute cellulitis and abscess right heel.  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  Past Medical History:  Diagnosis Date   Anemia    Anxiety    Arthritis    Colon polyps    Depression    Diabetes mellitus without complication (HCC)    DM (diabetes mellitus) (HCC) 01/30/2019   Fatty liver    GERD (gastroesophageal reflux disease)    Gout    High cholesterol    History of echocardiogram 09/14/2011   EF >55%; borderline concentric LVH;    History of stress test 06/21/2010   exercise; normal study   Hypertension    MET TEST 08/15/2011   moderate peak VO2 limitation at 66% predicted; mod cardiac impairment with low SV & low anaerobic threshold, mild chronotropic incompetence; low risk   Nausea    Osteomyelitis (HCC)    right great toe   Renal disorder    Renal insufficiency    Speech impediment    Thrombocytopenia    PMH   Past Surgical History:  Procedure Laterality Date   AMPUTATION Right 02/13/2019   Procedure: RIGHT GREAT TOE AMPUTATION;  Surgeon: Harden Jerona GAILS, MD;  Location: [facility] OR;  Service: Orthopedics;  Laterality: Right;   AMPUTATION Right 02/04/2022   Procedure: RIGHT TRANSMETATARSAL AMPUTATION APPLICATION OF WOUND VAC;  Surgeon: Harden Jerona GAILS, MD;  Location: [facility] OR;  Service: Orthopedics;  Laterality: Right;   AMPUTATION Right 01/19/2024   Procedure: AMPUTATION, FOOT, RAY;  Surgeon: Harden Jerona GAILS, MD;  Location: [facility] OR;  Service: Orthopedics;  Laterality: Right;  RIGHT FOOT 1ST RAY AMPUTATION   AMPUTATION TOE Left 12/02/2021   Procedure: AMPUTATION TOE;  Surgeon: Mavis Anes, MD;  Location: [facility] ORS;  Service: General;  Laterality: Left;   AMPUTATION TOE Left 12/06/2021   Procedure:  CLOSURE OF LEFT GREAT TOE AMPUTATION;  Surgeon: Mavis Anes, MD;  Location: [facility] ORS;  Service: General;  Laterality: Left;   CHOLECYSTECTOMY     COLONOSCOPY N/A 09/21/2012   Procedure: COLONOSCOPY;  Surgeon: Claudis RAYMOND Rivet, MD;  Location: [facility] SUITE;  Service: Endoscopy;  Laterality: N/A;  730   COLONOSCOPY N/A 01/30/2019   rehman, eight 4-7 mm polyps in rectum in sigmoid and at splenic flexure, transverse colon and in cecum, one 9mm polyp in recto-sigmoid colon, clip was placed, nine polyps total removed   ESOPHAGOGASTRODUODENOSCOPY  11/2005   normal mucosa and GE junction   HERNIA REPAIR     x 2   HIP SURGERY     Rt hip age 42   NASAL POLYP SURGERY      2-3 months ago   POLYPECTOMY  01/30/2019   Procedure: POLYPECTOMY;  Surgeon: Rivet Claudis RAYMOND, MD;  Location: [facility] SU[facility];  Service: Endoscopy;;  proximal transverse colon, cecal   Social History   Socioeconomic History   Marital status: Single    Spouse name: Not on file   Number of children: Not on file   Years of education: Not on file   Highest education level: Not on file  Occupational History   Not on file  Tobacco Use   Smoking status: Never   Smokeless tobacco: Never  Vaping Use   Vaping status: Never Used  Substance  and Sexual Activity   Alcohol use: No   Drug use: No   Sexual activity: Not on file  Other Topics Concern   Not on file  Social History Narrative   Not on file   Social Drivers of Health   Tobacco Use: Low Risk (02/18/2024)   Patient History    Smoking Tobacco Use: Never    Smokeless Tobacco Use: Never    Passive Exposure: Not on file  Financial Resource Strain: Low Risk (12/13/2021)   Overall Financial Resource Strain (CARDIA)    Difficulty of Paying Living Expenses: Not hard at all  Food Insecurity: No Food Insecurity (02/17/2024)   Epic    Worried About Radiation Protection Practitioner of Food in the Last Year: Never true    Ran Out of Food in the Last Year: Never true  Transportation Needs: No  Transportation Needs (02/17/2024)   Epic    Lack of Transportation (Medical): No    Lack of Transportation (Non-Medical): No  Physical Activity: Not on file  Stress: Not on file  Social Connections: Socially Isolated (02/17/2024)   Social Connection and Isolation Panel    Frequency of Communication with Friends and Family: More than three times a week    Frequency of Social Gatherings with Friends and Family: More than three times a week    Attends Religious Services: Never    Database Administrator or Organizations: No    Attends Banker Meetings: Never    Marital Status: Never married  Depression (PHQ2-9): Not on file  Alcohol Screen: Not on file  Housing: Low Risk (02/17/2024)   Epic    Unable to Pay for Housing in the Last Year: No    Number of Times Moved in the Last Year: 0    Homeless in the Last Year: No  Utilities: Not At Risk (02/17/2024)   Epic    Threatened with loss of utilities: No  Health Literacy: Not on file   Family History  Problem Relation Age of Onset   Colon cancer Brother    - negative except otherwise stated in the family history section Allergies[1] Prior to Admission medications  Medication Sig Start Date End Date Taking? Authorizing Provider  acetaminophen  (TYLENOL ) 500 MG tablet Take 2 tablets (1,000 mg total) by mouth every 8 (eight) hours as needed for mild pain (pain score 1-3) or moderate pain (pain score 4-6). Pain 06/03/23  Yes Beatty, Celeste A, PA-C  atorvastatin  (LIPITOR) 10 MG tablet Take 10 mg by mouth at bedtime. 09/21/17  Yes [provider]  B Complex-C (B-COMPLEX WITH VITAMIN C ) tablet Take 1 tablet by mouth daily.   Yes [provider]  buPROPion  (WELLBUTRIN  XL) 150 MG 24 hr tablet Take 150 mg by mouth at bedtime.  08/29/12  Yes [provider]  cetirizine (ZYRTEC) 10 MG tablet Take 10 mg by mouth daily as needed for allergies.   Yes [provider]  Cholecalciferol  (VITAMIN D ) 50 MCG (2000 UT)  CAPS Take 2,000 Units by mouth daily.    Yes [provider]  feeding supplement (ENSURE ENLIVE / ENSURE PLUS) LIQD Take 237 mLs by mouth 2 (two) times daily between meals. 01/24/24  Yes Akula, Vijaya, MD  glimepiride (AMARYL) 4 MG tablet Take 4 mg by mouth in the morning and at bedtime. 11/04/23  Yes [provider]  hydrALAZINE  (APRESOLINE ) 25 MG tablet TAKE 1 TABLET BY MOUTH IN THE MORNING AND AT BEDTIME. MAY TAKE ADDITIONAL AS NEEDED FOR SYSTOLIC BLOOD  PRESSURE CONSISTENTLY ABOVE 140. 04/24/23  Yes Branch, Dorn FALCON, MD  insulin  glargine, 2 Unit Dial, (TOUJEO  MAX SOLOSTAR) 300 UNIT/ML Solostar Pen Inject 20 Units into the skin daily.   Yes [provider]  metoprolol  succinate (TOPROL -XL) 100 MG 24 hr tablet Take 1 tablet (100 mg total) by mouth daily. Take with or immediately following a meal. 01/25/24  Yes Cherlyn Labella, MD  omeprazole  (PRILOSEC) 20 MG capsule TAKE ONE CAPSULE BY MOUTH DAILY. NEEDS OFFICE VISIT FOR FURTHER REFILLS. 01/24/22  Yes Carlan, Chelsea L, NP  ondansetron  (ZOFRAN ) 4 MG tablet Take 1 tablet (4 mg total) by mouth every 6 (six) hours as needed for nausea. 01/24/24  Yes Cherlyn Labella, MD  senna-docusate (SENOKOT-S) 8.6-50 MG tablet Take 2 tablets by mouth at bedtime as needed for mild constipation. 01/24/24  Yes Akula, Vijaya, MD  sertraline  (ZOLOFT ) 100 MG tablet Take 100 mg by mouth at bedtime.  05/09/15  Yes [provider]  sodium bicarbonate  650 MG tablet Take 650 mg by mouth 2 (two) times daily. 07/18/17  Yes [provider]  ULORIC  80 MG TABS Take 80 mg by mouth daily.  04/21/15  Yes [provider]  ACCU-CHEK GUIDE test strip USE AS DIRECTED TO TEST TWICE DAILY Patient not taking: Reported on 02/17/2024 02/01/22   [provider]  insulin  aspart (NOVOLOG ) 100 UNIT/ML injection CBG 70 - 120: 0 units  CBG 121 - 150: 2 units  CBG 151 - 200: 3 units  CBG 201 - 250: 5 units  CBG 251 - 300: 8 units  CBG 301 - 350:  11 units  CBG 351 - 400: 15 units Patient not taking: Reported on 02/18/2024 01/24/24   Cherlyn Labella, MD  NOVOLOG  FLEXPEN 100 UNIT/ML FlexPen Inject into the skin. CBG 70 - 120: 0 units CBG 121 - 150: 2 units CBG 151 - 200: 3 units CBG 201 - 250: 5 units CBG 251 - 300: 8 units CBG 301 - 350: 11 units CBG 351 - 400: 15 units Patient not taking: Reported on 02/18/2024 02/13/24   [provider]  pioglitazone (ACTOS) 30 MG tablet Take 30 mg by mouth daily. Patient not taking: Reported on 02/18/2024    [provider]   MR FOOT RIGHT WO CONTRAST Result Date: 02/17/2024 CLINICAL DATA:  Open wound involving the medial aspect of the hindfoot. EXAM: MRI OF THE RIGHT FOREFOOT WITHOUT CONTRAST TECHNIQUE: Multiplanar, multisequence MR imaging of the right foot was performed. No intravenous contrast was administered. COMPARISON:  Radiographs, same date. FINDINGS: Prior mid distal amputations of the second through fifth toes. Prior amputation of the first ray. Abnormal T1 and T2 signal intensity in the medial cuneiform consistent with osteomyelitis. There is also an adjacent complex fluid collection measuring 3.2 x 2.4 x 2.1 cm and consistent with an abscess. I do not see any definite findings for osteomyelitis involving the other cuneiform is or metatarsals. Open wound noted along the medial aspect of the hindfoot with an underlying large complex abscess involving the subcutaneous tissues and also the underlying musculature. This measures approximately 5.1 x 3.9 x 2.9 cm. Adjacent diffuse osteomyelitis involving the calcaneus. Diffuse cellulitis and myofasciitis. Small tibiotalar joint effusion but no findings suspicious for septic arthritis. The subtalar joints are maintained. Severe chronic Achilles tendinopathy. The plantar fascia is surrounded by abscess and is torn IMPRESSION: 1. Open wound along the medial aspect of the hindfoot with an underlying large complex abscess involving the subcutaneous  tissues and also the  underlying musculature. 2. Osteomyelitis involving the medial cuneiform and the calcaneus. 3. Diffuse cellulitis and myofasciitis. 4. Small tibiotalar joint effusion but no findings suspicious for septic arthritis. 5. Severe chronic Achilles tendinopathy. Electronically Signed   By: MYRTIS Stammer M.D.   On: 02/17/2024 19:08   DG Foot Complete Right Result Date: 02/17/2024 CLINICAL DATA:  Purulent discharge from surgical wound. EXAM: RIGHT FOOT COMPLETE - 3+ VIEW COMPARISON:  01/16/2024. FINDINGS: First ray amputation with additional amputations of the second through fifth rays at the metatarsal necks. Soft tissue wound overlies the distal second metatarsal. No osseous erosion to suggest osteomyelitis. No radiopaque foreign body. Prominent calcaneal spurs. IMPRESSION: 1. Soft tissue wound over the distal second metatarsal. No underlying cortical erosion to suggest osteomyelitis. 2. First through fifth ray amputations, as detailed above. Electronically Signed   By: Newell Eke M.D.   On: 02/17/2024 16:06   DG Chest Port 1 View Result Date: 02/17/2024 CLINICAL DATA:  Weakness, purulent discharge from surgical wound on foot. EXAM: PORTABLE CHEST 1 VIEW COMPARISON:  01/16/2024. FINDINGS: Trachea is midline. Heart size normal. Thoracic aorta is calcified. Minimal streaky atelectasis or scarring in the left lung base. No pleural fluid. IMPRESSION: Minimal streaky atelectasis or scarring in the left lung base. Electronically Signed   By: Newell Eke M.D.   On: 02/17/2024 16:04   - pertinent xrays, CT, MRI studies were reviewed and independently interpreted  Positive ROS: All other systems have been reviewed and were otherwise negative with the exception of those mentioned in the HPI and as above.  Physical Exam: General: Alert, no acute distress Psychiatric: Patient is competent for consent with normal mood and affect Lymphatic: No axillary or cervical  lymphadenopathy Cardiovascular: No pedal edema Respiratory: No cyanosis, no use of accessory musculature GI: No organomegaly, abdomen is soft and non-tender    Images:  @ENCIMAGES @  Labs:  Lab Results  Component Value Date   HGBA1C 9.8 (H) 01/17/2024   HGBA1C 8.1 (H) 05/03/2023   HGBA1C 10.7 (H) 03/01/2023   ESRSEDRATE 78 (H) 01/16/2024   ESRSEDRATE 16 03/02/2023   ESRSEDRATE 107 (H) 11/30/2021   CRP 17.9 (H) 01/16/2024   CRP <0.5 03/02/2023   CRP 22.9 (H) 11/30/2021   LABURIC 6.9 01/09/2019   REPTSTATUS PENDING 02/17/2024   GRAMSTAIN  12/02/2021    ABUNDANT WBC PRESENT,BOTH PMN AND MONONUCLEAR ABUNDANT GRAM NEGATIVE RODS FEW GRAM POSITIVE COCCI IN PAIRS IN CHAINS    CULT  02/17/2024    NO GROWTH < 24 HOURS Performed at [facility], [address] [place], [place] [address]    [facility] ENTEROCOCCUS FAECALIS 12/02/2021   LABORGA STREPTOCOCCUS MITIS/ORALIS 12/02/2021   LABORGA PROTEUS PENNERI 12/02/2021    Lab Results  Component Value Date   ALBUMIN 3.6 02/17/2024   ALBUMIN 4.1 01/16/2024   ALBUMIN 3.6 06/03/2023   LABURIC 6.9 01/09/2019        Latest Ref Rng & Units 02/19/2024    2:41 AM 02/18/2024    5:13 AM 02/17/2024    2:48 PM  CBC EXTENDED  WBC 4.0 - 10.5 K/uL 2.7  2.7  4.8   RBC 4.22 - 5.81 MIL/uL 3.61  3.55  4.76   Hemoglobin 13.0 - 17.0 g/dL 9.7  9.5  87.3   HCT 60.9 - 52.0 % 29.6  29.2  38.8   Platelets 150 - 400 K/uL 257  273  364   NEUT# 1.7 - 7.7 K/uL 1.7   3.4   Lymph# 0.7 - 4.0 K/uL  0.7   1.0     Neurologic: Patient does not have protective sensation bilateral lower extremities.   MUSCULOSKELETAL:   Skin: Examination patient has cellulitis and fluctuance right heel.  Review of the MRI scan shows osteomyelitis of the right calcaneus with an associated abscess.  There is also osteomyelitis of the residual midfoot.  Laboratory studies shows a hemoglobin of 9.7 with a white cell count of 2.7.  Hemoglobin A1c 9.8.  Assessment: Assessment:  Abscess and osteomyelitis of the right hindfoot status post foot salvage intervention with midfoot amputation and first ray amputation.  Plan: Plan: Discussed that patient does not have further foot salvage intervention options.  Will plan for a right below-knee amputation on Wednesday.  Anticipate return to skilled nursing facility after patient stabilized postoperatively.  Thank you for the consult and the opportunity to see Mr. Demario Faniel, MD [facility] [phone] 7:17 AM         [1]  Allergies Allergen Reactions   Ivp Dye [Iodinated Contrast Media] Other (See Comments)    Speed heart rate up    Penicillins Rash    Pt tolerated amoxicillin  oral challenge   Tetanus Toxoid-Containing Vaccines Rash   "

## 2024-02-19 NOTE — Consult Note (Signed)
 "  ORTHOPAEDIC CONSULTATION  REQUESTING PHYSICIAN: Caleen Burgess BROCKS, MD  Chief Complaint: Cellulitis right heel.  HPI: [name] is a 74 y.o. male who presents with acute cellulitis and abscess right heel.  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  Past Medical History:  Diagnosis Date   Anemia    Anxiety    Arthritis    Colon polyps    Depression    Diabetes mellitus without complication (HCC)    DM (diabetes mellitus) (HCC) 01/30/2019   Fatty liver    GERD (gastroesophageal reflux disease)    Gout    High cholesterol    History of echocardiogram 09/14/2011   EF >55%; borderline concentric LVH;    History of stress test 06/21/2010   exercise; normal study   Hypertension    MET TEST 08/15/2011   moderate peak VO2 limitation at 66% predicted; mod cardiac impairment with low SV & low anaerobic threshold, mild chronotropic incompetence; low risk   Nausea    Osteomyelitis (HCC)    right great toe   Renal disorder    Renal insufficiency    Speech impediment    Thrombocytopenia    PMH   Past Surgical History:  Procedure Laterality Date   AMPUTATION Right 02/13/2019   Procedure: RIGHT GREAT TOE AMPUTATION;  Surgeon: Harden Jerona GAILS, MD;  Location: [facility] OR;  Service: Orthopedics;  Laterality: Right;   AMPUTATION Right 02/04/2022   Procedure: RIGHT TRANSMETATARSAL AMPUTATION APPLICATION OF WOUND VAC;  Surgeon: Harden Jerona GAILS, MD;  Location: [facility] OR;  Service: Orthopedics;  Laterality: Right;   AMPUTATION Right 01/19/2024   Procedure: AMPUTATION, FOOT, RAY;  Surgeon: Harden Jerona GAILS, MD;  Location: [facility] OR;  Service: Orthopedics;  Laterality: Right;  RIGHT FOOT 1ST RAY AMPUTATION   AMPUTATION TOE Left 12/02/2021   Procedure: AMPUTATION TOE;  Surgeon: Mavis Anes, MD;  Location: [facility] ORS;  Service: General;  Laterality: Left;   AMPUTATION TOE Left 12/06/2021   Procedure:  CLOSURE OF LEFT GREAT TOE AMPUTATION;  Surgeon: Mavis Anes, MD;  Location: [facility] ORS;  Service: General;  Laterality: Left;   CHOLECYSTECTOMY     COLONOSCOPY N/A 09/21/2012   Procedure: COLONOSCOPY;  Surgeon: Claudis RAYMOND Rivet, MD;  Location: [facility];  Service: Endoscopy;  Laterality: N/A;  730   COLONOSCOPY N/A 01/30/2019   rehman, eight 4-7 mm polyps in rectum in sigmoid and at splenic flexure, transverse colon and in cecum, one 9mm polyp in recto-sigmoid colon, clip was placed, nine polyps total removed   ESOPHAGOGASTRODUODENOSCOPY  11/2005   normal mucosa and GE junction   HERNIA REPAIR     x 2   HIP SURGERY     Rt hip age 42   NASAL POLYP SURGERY      2-3 months ago   POLYPECTOMY  01/30/2019   Procedure: POLYPECTOMY;  Surgeon: Rivet Claudis RAYMOND, MD;  Location: [facility];  Service: Endoscopy;;  proximal transverse colon, cecal   Social History   Socioeconomic History   Marital status: Single    Spouse name: Not on file   Number of children: Not on file   Years of education: Not on file   Highest education level: Not on file  Occupational History   Not on file  Tobacco Use   Smoking status: Never   Smokeless tobacco: Never  Vaping Use   Vaping status: Never Used  Substance  and Sexual Activity   Alcohol use: No   Drug use: No   Sexual activity: Not on file  Other Topics Concern   Not on file  Social History Narrative   Not on file   Social Drivers of Health   Tobacco Use: Low Risk (02/18/2024)   Patient History    Smoking Tobacco Use: Never    Smokeless Tobacco Use: Never    Passive Exposure: Not on file  Financial Resource Strain: Low Risk (12/13/2021)   Overall Financial Resource Strain (CARDIA)    Difficulty of Paying Living Expenses: Not hard at all  Food Insecurity: No Food Insecurity (02/17/2024)   Epic    Worried About Radiation Protection Practitioner of Food in the Last Year: Never true    Ran Out of Food in the Last Year: Never true  Transportation Needs: No  Transportation Needs (02/17/2024)   Epic    Lack of Transportation (Medical): No    Lack of Transportation (Non-Medical): No  Physical Activity: Not on file  Stress: Not on file  Social Connections: Socially Isolated (02/17/2024)   Social Connection and Isolation Panel    Frequency of Communication with Friends and Family: More than three times a week    Frequency of Social Gatherings with Friends and Family: More than three times a week    Attends Religious Services: Never    Database Administrator or Organizations: No    Attends Banker Meetings: Never    Marital Status: Never married  Depression (PHQ2-9): Not on file  Alcohol Screen: Not on file  Housing: Low Risk (02/17/2024)   Epic    Unable to Pay for Housing in the Last Year: No    Number of Times Moved in the Last Year: 0    Homeless in the Last Year: No  Utilities: Not At Risk (02/17/2024)   Epic    Threatened with loss of utilities: No  Health Literacy: Not on file   Family History  Problem Relation Age of Onset   Colon cancer Brother    - negative except otherwise stated in the family history section Allergies[1] Prior to Admission medications  Medication Sig Start Date End Date Taking? Authorizing Provider  acetaminophen  (TYLENOL ) 500 MG tablet Take 2 tablets (1,000 mg total) by mouth every 8 (eight) hours as needed for mild pain (pain score 1-3) or moderate pain (pain score 4-6). Pain 06/03/23  Yes Beatty, Celeste A, PA-C  atorvastatin  (LIPITOR) 10 MG tablet Take 10 mg by mouth at bedtime. 09/21/17  Yes [provider]  B Complex-C (B-COMPLEX WITH VITAMIN C ) tablet Take 1 tablet by mouth daily.   Yes [provider]  buPROPion  (WELLBUTRIN  XL) 150 MG 24 hr tablet Take 150 mg by mouth at bedtime.  08/29/12  Yes [provider]  cetirizine (ZYRTEC) 10 MG tablet Take 10 mg by mouth daily as needed for allergies.   Yes [provider]  Cholecalciferol  (VITAMIN D ) 50 MCG (2000 UT)  CAPS Take 2,000 Units by mouth daily.    Yes [provider]  feeding supplement (ENSURE ENLIVE / ENSURE PLUS) LIQD Take 237 mLs by mouth 2 (two) times daily between meals. 01/24/24  Yes Akula, Vijaya, MD  glimepiride (AMARYL) 4 MG tablet Take 4 mg by mouth in the morning and at bedtime. 11/04/23  Yes [provider]  hydrALAZINE  (APRESOLINE ) 25 MG tablet TAKE 1 TABLET BY MOUTH IN THE MORNING AND AT BEDTIME. MAY TAKE ADDITIONAL AS NEEDED FOR SYSTOLIC BLOOD  PRESSURE CONSISTENTLY ABOVE 140. 04/24/23  Yes Branch, Dorn FALCON, MD  insulin  glargine, 2 Unit Dial, (TOUJEO  MAX SOLOSTAR) 300 UNIT/ML Solostar Pen Inject 20 Units into the skin daily.   Yes [provider]  metoprolol  succinate (TOPROL -XL) 100 MG 24 hr tablet Take 1 tablet (100 mg total) by mouth daily. Take with or immediately following a meal. 01/25/24  Yes Cherlyn Labella, MD  omeprazole  (PRILOSEC) 20 MG capsule TAKE ONE CAPSULE BY MOUTH DAILY. NEEDS OFFICE VISIT FOR FURTHER REFILLS. 01/24/22  Yes Carlan, Chelsea L, NP  ondansetron  (ZOFRAN ) 4 MG tablet Take 1 tablet (4 mg total) by mouth every 6 (six) hours as needed for nausea. 01/24/24  Yes Cherlyn Labella, MD  senna-docusate (SENOKOT-S) 8.6-50 MG tablet Take 2 tablets by mouth at bedtime as needed for mild constipation. 01/24/24  Yes Akula, Vijaya, MD  sertraline  (ZOLOFT ) 100 MG tablet Take 100 mg by mouth at bedtime.  05/09/15  Yes [provider]  sodium bicarbonate  650 MG tablet Take 650 mg by mouth 2 (two) times daily. 07/18/17  Yes [provider]  ULORIC  80 MG TABS Take 80 mg by mouth daily.  04/21/15  Yes [provider]  ACCU-CHEK GUIDE test strip USE AS DIRECTED TO TEST TWICE DAILY Patient not taking: Reported on 02/17/2024 02/01/22   [provider]  insulin  aspart (NOVOLOG ) 100 UNIT/ML injection CBG 70 - 120: 0 units  CBG 121 - 150: 2 units  CBG 151 - 200: 3 units  CBG 201 - 250: 5 units  CBG 251 - 300: 8 units  CBG 301 - 350:  11 units  CBG 351 - 400: 15 units Patient not taking: Reported on 02/18/2024 01/24/24   Cherlyn Labella, MD  NOVOLOG  FLEXPEN 100 UNIT/ML FlexPen Inject into the skin. CBG 70 - 120: 0 units CBG 121 - 150: 2 units CBG 151 - 200: 3 units CBG 201 - 250: 5 units CBG 251 - 300: 8 units CBG 301 - 350: 11 units CBG 351 - 400: 15 units Patient not taking: Reported on 02/18/2024 02/13/24   [provider]  pioglitazone (ACTOS) 30 MG tablet Take 30 mg by mouth daily. Patient not taking: Reported on 02/18/2024    [provider]   MR FOOT RIGHT WO CONTRAST Result Date: 02/17/2024 CLINICAL DATA:  Open wound involving the medial aspect of the hindfoot. EXAM: MRI OF THE RIGHT FOREFOOT WITHOUT CONTRAST TECHNIQUE: Multiplanar, multisequence MR imaging of the right foot was performed. No intravenous contrast was administered. COMPARISON:  Radiographs, same date. FINDINGS: Prior mid distal amputations of the second through fifth toes. Prior amputation of the first ray. Abnormal T1 and T2 signal intensity in the medial cuneiform consistent with osteomyelitis. There is also an adjacent complex fluid collection measuring 3.2 x 2.4 x 2.1 cm and consistent with an abscess. I do not see any definite findings for osteomyelitis involving the other cuneiform is or metatarsals. Open wound noted along the medial aspect of the hindfoot with an underlying large complex abscess involving the subcutaneous tissues and also the underlying musculature. This measures approximately 5.1 x 3.9 x 2.9 cm. Adjacent diffuse osteomyelitis involving the calcaneus. Diffuse cellulitis and myofasciitis. Small tibiotalar joint effusion but no findings suspicious for septic arthritis. The subtalar joints are maintained. Severe chronic Achilles tendinopathy. The plantar fascia is surrounded by abscess and is torn IMPRESSION: 1. Open wound along the medial aspect of the hindfoot with an underlying large complex abscess involving the subcutaneous  tissues and also the  underlying musculature. 2. Osteomyelitis involving the medial cuneiform and the calcaneus. 3. Diffuse cellulitis and myofasciitis. 4. Small tibiotalar joint effusion but no findings suspicious for septic arthritis. 5. Severe chronic Achilles tendinopathy. Electronically Signed   By: MYRTIS Stammer M.D.   On: 02/17/2024 19:08   DG Foot Complete Right Result Date: 02/17/2024 CLINICAL DATA:  Purulent discharge from surgical wound. EXAM: RIGHT FOOT COMPLETE - 3+ VIEW COMPARISON:  01/16/2024. FINDINGS: First ray amputation with additional amputations of the second through fifth rays at the metatarsal necks. Soft tissue wound overlies the distal second metatarsal. No osseous erosion to suggest osteomyelitis. No radiopaque foreign body. Prominent calcaneal spurs. IMPRESSION: 1. Soft tissue wound over the distal second metatarsal. No underlying cortical erosion to suggest osteomyelitis. 2. First through fifth ray amputations, as detailed above. Electronically Signed   By: Newell Eke M.D.   On: 02/17/2024 16:06   DG Chest Port 1 View Result Date: 02/17/2024 CLINICAL DATA:  Weakness, purulent discharge from surgical wound on foot. EXAM: PORTABLE CHEST 1 VIEW COMPARISON:  01/16/2024. FINDINGS: Trachea is midline. Heart size normal. Thoracic aorta is calcified. Minimal streaky atelectasis or scarring in the left lung base. No pleural fluid. IMPRESSION: Minimal streaky atelectasis or scarring in the left lung base. Electronically Signed   By: Newell Eke M.D.   On: 02/17/2024 16:04   - pertinent xrays, CT, MRI studies were reviewed and independently interpreted  Positive ROS: All other systems have been reviewed and were otherwise negative with the exception of those mentioned in the HPI and as above.  Physical Exam: General: Alert, no acute distress Psychiatric: Patient is competent for consent with normal mood and affect Lymphatic: No axillary or cervical  lymphadenopathy Cardiovascular: No pedal edema Respiratory: No cyanosis, no use of accessory musculature GI: No organomegaly, abdomen is soft and non-tender    Images:  @ENCIMAGES @  Labs:  Lab Results  Component Value Date   HGBA1C 9.8 (H) 01/17/2024   HGBA1C 8.1 (H) 05/03/2023   HGBA1C 10.7 (H) 03/01/2023   ESRSEDRATE 78 (H) 01/16/2024   ESRSEDRATE 16 03/02/2023   ESRSEDRATE 107 (H) 11/30/2021   CRP 17.9 (H) 01/16/2024   CRP <0.5 03/02/2023   CRP 22.9 (H) 11/30/2021   LABURIC 6.9 01/09/2019   REPTSTATUS PENDING 02/17/2024   GRAMSTAIN  12/02/2021    ABUNDANT WBC PRESENT,BOTH PMN AND MONONUCLEAR ABUNDANT GRAM NEGATIVE RODS FEW GRAM POSITIVE COCCI IN PAIRS IN CHAINS    CULT  02/17/2024    NO GROWTH < 24 HOURS Performed at [facility], [address] [place], [place] [address]    [facility] ENTEROCOCCUS FAECALIS 12/02/2021   LABORGA STREPTOCOCCUS MITIS/ORALIS 12/02/2021   LABORGA PROTEUS PENNERI 12/02/2021    Lab Results  Component Value Date   ALBUMIN 3.6 02/17/2024   ALBUMIN 4.1 01/16/2024   ALBUMIN 3.6 06/03/2023   LABURIC 6.9 01/09/2019        Latest Ref Rng & Units 02/19/2024    2:41 AM 02/18/2024    5:13 AM 02/17/2024    2:48 PM  CBC EXTENDED  WBC 4.0 - 10.5 K/uL 2.7  2.7  4.8   RBC 4.22 - 5.81 MIL/uL 3.61  3.55  4.76   Hemoglobin 13.0 - 17.0 g/dL 9.7  9.5  87.3   HCT 60.9 - 52.0 % 29.6  29.2  38.8   Platelets 150 - 400 K/uL 257  273  364   NEUT# 1.7 - 7.7 K/uL 1.7   3.4   Lymph# 0.7 - 4.0 K/uL  0.7   1.0     Neurologic: Patient does not have protective sensation bilateral lower extremities.   MUSCULOSKELETAL:   Skin: Examination patient has cellulitis and fluctuance right heel.  Review of the MRI scan shows osteomyelitis of the right calcaneus with an associated abscess.  There is also osteomyelitis of the residual midfoot.  Laboratory studies shows a hemoglobin of 9.7 with a white cell count of 2.7.  Hemoglobin A1c 9.8.  Assessment: Assessment:  Abscess and osteomyelitis of the right hindfoot status post foot salvage intervention with midfoot amputation and first ray amputation.  Plan: Plan: Discussed that patient does not have further foot salvage intervention options.  Will plan for a right below-knee amputation on Wednesday.  Anticipate return to skilled nursing facility after patient stabilized postoperatively.  Thank you for the consult and the opportunity to see Mr. Demario Faniel, MD [facility] [phone] 7:17 AM         [1]  Allergies Allergen Reactions   Ivp Dye [Iodinated Contrast Media] Other (See Comments)    Speed heart rate up    Penicillins Rash    Pt tolerated amoxicillin  oral challenge   Tetanus Toxoid-Containing Vaccines Rash   "

## 2024-02-19 NOTE — Plan of Care (Signed)
   Problem: Education: Goal: Knowledge of General Education information will improve Description Including pain rating scale, medication(s)/side effects and non-pharmacologic comfort measures Outcome: Progressing   Problem: Health Behavior/Discharge Planning: Goal: Ability to manage health-related needs will improve Outcome: Progressing

## 2024-02-19 NOTE — Progress Notes (Signed)
 " PROGRESS NOTE    [name]  FMW:[id number] DOB: [birth year] DOA: 02/17/2024 PCP: Marvine Rush, MD    Brief Narrative:   74 year old with history of CVA with residual slurred speech, CKD 3 AA, chronic osteomyelitis s/p right TMA, HTN, DM 2 admitted to the hospital for generally feeling sick and having nausea/vomiting.  Workup showed concerns of sepsis with worsening right diabetic foot infection with abscess.  Dr. Harden was consulted and patient was transferred from [facility] to [facility] for surgical intervention  Assessment & Plan:   Sepsis secondary to right diabetic foot infection associated with abscess and osteomyelitis Gram-positive bacteremia -Orthopedic consulted.  For now continue IV antibiotics.  Dr. Harden is planning on right sided BKA for better source control. - Current antibiotics Unasyn  and linezolid .  ID consulted -Repeat blood cultures 1/5 -Echocardiogram, likely will require TEE   Type 2 diabetes mellitus with neurological complications, uncontrolled with hyperglycemia -A1c 9.8.  Sliding scale and Accu-Chek.  Will adjust as necessary   AKI on CKD stage 3b - Baseline creatinine around 2.0, admission creatinine 4.82.  Continue fluids monitor urine output.  Will check bladder scan and ultrasound   Gastroenteritis -Improved.  C. difficile antigen positive per toxigenic PCR is negative.  Supportive care   Diffuse macular rash  -Had mild symptoms, unclear.  Improving   Essential hypertension  Sinus tachycardia -Toprol -XL IV as needed     DVT prophylaxis: heparin  Code Status: Full  Family Communication:  Disposition: Multiple issues on going    PT Follow up Recs:   Subjective: Feeling okay no complaints at this time Remains afebrile   Examination:  General exam: Appears calm and comfortable  Respiratory system: Clear to auscultation. Respiratory effort normal. Cardiovascular system: S1 & S2 heard, RRR. No JVD, murmurs, rubs, gallops or  clicks. No pedal edema. Gastrointestinal system: Abdomen is nondistended, soft and nontender. No organomegaly or masses felt. Normal bowel sounds heard. Central nervous system: Alert and oriented. No focal neurological deficits. Extremities: Symmetric 5 x 5 power.  Lower extremity dressing noted Skin: Diffuse maculopapular rash appears to be better Psychiatry: Judgement and insight appear normal. Mood & affect appropriate.                Diet Orders (From admission, onward)     Start     Ordered   02/19/24 0724  Diet Carb Modified  Diet effective now       Question Answer Comment  Calorie Level Medium 1600-2000   Fluid consistency: Thin      02/19/24 0723            Objective: Vitals:   02/18/24 2005 02/19/24 0346 02/19/24 0746 02/19/24 1146  BP: 131/69 (!) 121/52 (!) 142/68 (!) 140/62  Pulse: 98 88 86 87  Resp: 19 16 18 17   Temp: 99.5 F (37.5 C) 98.6 F (37 C) 98.3 F (36.8 C) 97.9 F (36.6 C)  TempSrc: Oral  Oral   SpO2: 100% 97% 97% 98%  Weight:      Height:        Intake/Output Summary (Last 24 hours) at 02/19/2024 1206 Last data filed at 02/19/2024 0818 Gross per 24 hour  Intake 1322.5 ml  Output 300 ml  Net 1022.5 ml   Filed Weights   02/17/24 1420  Weight: 90.2 kg    Scheduled Meds:  benzonatate   100 mg Oral TID   heparin   5,000 Units Subcutaneous Q8H   insulin  aspart  0-15 Units Subcutaneous Q4H  insulin  aspart  3 Units Subcutaneous TID WC   insulin  glargine-yfgn  10 Units Subcutaneous QHS   metoprolol  succinate  50 mg Oral Daily   Continuous Infusions:  sodium chloride  75 mL/hr at 02/19/24 0827   ampicillin -sulbactam (UNASYN ) IV 3 g (02/19/24 1134)   DAPTOmycin       Nutritional status     Body mass index is 25.54 kg/m.  Data Reviewed:   CBC: Recent Labs  Lab 02/17/24 1448 02/18/24 0513 02/19/24 0241  WBC 4.8 2.7* 2.7*  NEUTROABS 3.4  --  1.7  HGB 12.6* 9.5* 9.7*  HCT 38.8* 29.2* 29.6*  MCV 81.5 82.3 82.0  PLT 364  273 257   Basic Metabolic Panel: Recent Labs  Lab 02/17/24 1448 02/18/24 0513 02/19/24 0241  NA 135 136 136  K 3.9 3.7 3.4*  CL 94* 99 103  CO2 20* 26 22  GLUCOSE 267* 128* 103*  BUN 51* 50* 52*  CREATININE 4.82* 4.53* 4.33*  CALCIUM  8.7* 7.9* 7.8*   GFR: Estimated Creatinine Clearance: 17.9 mL/min (A) (by C-G formula based on SCr of 4.33 mg/dL (H)). Liver Function Tests: Recent Labs  Lab 02/17/24 1448  AST 19  ALT 20  ALKPHOS 77  BILITOT 0.7  PROT 7.9  ALBUMIN 3.6   No results for input(s): LIPASE, AMYLASE in the last 168 hours. No results for input(s): AMMONIA in the last 168 hours. Coagulation Profile: Recent Labs  Lab 02/17/24 1448  INR 1.1   Cardiac Enzymes: No results for input(s): CKTOTAL, CKMB, CKMBINDEX, TROPONINI in the last 168 hours. BNP (last 3 results) No results for input(s): PROBNP in the last 8760 hours. HbA1C: No results for input(s): HGBA1C in the last 72 hours. CBG: Recent Labs  Lab 02/18/24 2004 02/18/24 2350 02/19/24 0343 02/19/24 0745 02/19/24 1147  GLUCAP 145* 142* 95 103* 150*   Lipid Profile: No results for input(s): CHOL, HDL, LDLCALC, TRIG, CHOLHDL, LDLDIRECT in the last 72 hours. Thyroid  Function Tests: No results for input(s): TSH, T4TOTAL, FREET4, T3FREE, THYROIDAB in the last 72 hours. Anemia Panel: No results for input(s): VITAMINB12, FOLATE, FERRITIN, TIBC, IRON, RETICCTPCT in the last 72 hours. Sepsis Labs: Recent Labs  Lab 02/17/24 1448 02/17/24 1627 02/18/24 0924  LATICACIDVEN 2.8* 2.5* 1.4    Recent Results (from the past 240 hours)  Resp panel by RT-PCR (RSV, Flu A&B, Covid) Anterior Nasal Swab     Status: None   Collection Time: 02/17/24  2:21 PM   Specimen: Anterior Nasal Swab  Result Value Ref Range Status   SARS Coronavirus 2 by RT PCR NEGATIVE NEGATIVE Final    Comment: (NOTE) SARS-CoV-2 target nucleic acids are NOT DETECTED.  The SARS-CoV-2 RNA  is generally detectable in upper respiratory specimens during the acute phase of infection. The lowest concentration of SARS-CoV-2 viral copies this assay can detect is 138 copies/mL. A negative result does not preclude SARS-Cov-2 infection and should not be used as the sole basis for treatment or other patient management decisions. A negative result may occur with  improper specimen collection/handling, submission of specimen other than nasopharyngeal swab, presence of viral mutation(s) within the areas targeted by this assay, and inadequate number of viral copies(<138 copies/mL). A negative result must be combined with clinical observations, patient history, and epidemiological information. The expected result is Negative.  Fact Sheet for Patients:  bloggercourse.com  Fact Sheet for Healthcare Providers:  seriousbroker.it  This test is no t yet approved or cleared by the [place]  FDA and  has been  authorized for detection and/or diagnosis of SARS-CoV-2 by FDA under an Emergency Use Authorization (EUA). This EUA will remain  in effect (meaning this test can be used) for the duration of the COVID-19 declaration under Section 564(b)(1) of the Act, 21 U.S.C.section 360bbb-3(b)(1), unless the authorization is terminated  or revoked sooner.       Influenza A by PCR NEGATIVE NEGATIVE Final   Influenza B by PCR NEGATIVE NEGATIVE Final    Comment: (NOTE) The Xpert Xpress SARS-CoV-2/FLU/RSV plus assay is intended as an aid in the diagnosis of influenza from Nasopharyngeal swab specimens and should not be used as a sole basis for treatment. Nasal washings and aspirates are unacceptable for Xpert Xpress SARS-CoV-2/FLU/RSV testing.  Fact Sheet for Patients: bloggercourse.com  Fact Sheet for Healthcare Providers: seriousbroker.it  This test is not yet approved or cleared by the  [place]  FDA and has been authorized for detection and/or diagnosis of SARS-CoV-2 by FDA under an Emergency Use Authorization (EUA). This EUA will remain in effect (meaning this test can be used) for the duration of the COVID-19 declaration under Section 564(b)(1) of the Act, 21 U.S.C. section 360bbb-3(b)(1), unless the authorization is terminated or revoked.     Resp Syncytial Virus by PCR NEGATIVE NEGATIVE Final    Comment: (NOTE) Fact Sheet for Patients: bloggercourse.com  Fact Sheet for Healthcare Providers: seriousbroker.it  This test is not yet approved or cleared by the [place]  FDA and has been authorized for detection and/or diagnosis of SARS-CoV-2 by FDA under an Emergency Use Authorization (EUA). This EUA will remain in effect (meaning this test can be used) for the duration of the COVID-19 declaration under Section 564(b)(1) of the Act, 21 U.S.C. section 360bbb-3(b)(1), unless the authorization is terminated or revoked.  Performed at [facility], [address] [place], [place] [address]   Blood Culture (routine x 2)     Status: Abnormal (Preliminary result)   Collection Time: 02/17/24  2:48 PM   Specimen: BLOOD LEFT ARM  Result Value Ref Range Status   Specimen Description   Final    BLOOD LEFT ARM BOTTLES DRAWN AEROBIC AND ANAEROBIC Performed at [facility], [address], [place], [place] [address]    Special Requests   Final    Blood Culture adequate volume Performed at [facility], [address] [place], [place] [address]    Culture  Setup Time   Final    GRAM POSITIVE COCCI ANAEROBIC BOTTLE ONLY Gram Stain Report Called to,Read Back By and Verified With: C. ANTONE ON 02/18/2024 @10 :13AM BY T. HAMER  CRITICAL RESULT CALLED TO, READ BACK BY AND VERIFIED WITH: RN DOROTHYANN ANTONE [id number] AT 1351 BY EC    Culture (A)  Final    STAPHYLOCOCCUS AUREUS SUSCEPTIBILITIES TO FOLLOW Performed at  [facility], [address], [place], [place] [address]    Report Status PENDING  Incomplete  Blood Culture ID Panel (Reflexed)     Status: Abnormal   Collection Time: 02/17/24  2:48 PM  Result Value Ref Range Status   Enterococcus faecalis NOT DETECTED NOT DETECTED Final   Enterococcus Faecium NOT DETECTED NOT DETECTED Final   Listeria monocytogenes NOT DETECTED NOT DETECTED Final   Staphylococcus species DETECTED (A) NOT DETECTED Final    Comment: CRITICAL RESULT CALLED TO, READ BACK BY AND VERIFIED WITH: RN DOROTHYANN ANTONE [id number] AT 1351 BY EC    Staphylococcus aureus (BCID) DETECTED (A) NOT DETECTED Final    Comment: Methicillin (oxacillin)-resistant Staphylococcus aureus (MRSA).  MRSA is predictably resistant to beta-lactam antibiotics (except ceftaroline). Preferred therapy is vancomycin  unless clinically contraindicated. Patient requires contact precautions if  hospitalized. CRITICAL RESULT CALLED TO, READ BACK BY AND VERIFIED WITH: RN DOROTHYANN ISLAND [phone] AT 1351 BY EC    Staphylococcus epidermidis NOT DETECTED NOT DETECTED Final   Staphylococcus lugdunensis NOT DETECTED NOT DETECTED Final   Streptococcus species NOT DETECTED NOT DETECTED Final   Streptococcus agalactiae NOT DETECTED NOT DETECTED Final   Streptococcus pneumoniae NOT DETECTED NOT DETECTED Final   Streptococcus pyogenes NOT DETECTED NOT DETECTED Final   A.calcoaceticus-baumannii NOT DETECTED NOT DETECTED Final   Bacteroides fragilis NOT DETECTED NOT DETECTED Final   Enterobacterales NOT DETECTED NOT DETECTED Final   Enterobacter cloacae complex NOT DETECTED NOT DETECTED Final   Escherichia coli NOT DETECTED NOT DETECTED Final   Klebsiella aerogenes NOT DETECTED NOT DETECTED Final   Klebsiella oxytoca NOT DETECTED NOT DETECTED Final   Klebsiella pneumoniae NOT DETECTED NOT DETECTED Final   Proteus species NOT DETECTED NOT DETECTED Final   Salmonella species NOT DETECTED NOT DETECTED Final    Serratia marcescens NOT DETECTED NOT DETECTED Final   Haemophilus influenzae NOT DETECTED NOT DETECTED Final   Neisseria meningitidis NOT DETECTED NOT DETECTED Final   Pseudomonas aeruginosa NOT DETECTED NOT DETECTED Final   Stenotrophomonas maltophilia NOT DETECTED NOT DETECTED Final   Candida albicans NOT DETECTED NOT DETECTED Final   Candida auris NOT DETECTED NOT DETECTED Final   Candida glabrata NOT DETECTED NOT DETECTED Final   Candida krusei NOT DETECTED NOT DETECTED Final   Candida parapsilosis NOT DETECTED NOT DETECTED Final   Candida tropicalis NOT DETECTED NOT DETECTED Final   Cryptococcus neoformans/gattii NOT DETECTED NOT DETECTED Final   Meth resistant mecA/C and MREJ DETECTED (A) NOT DETECTED Final    Comment: CRITICAL RESULT CALLED TO, READ BACK BY AND VERIFIED WITH: RN DOROTHYANN ISLAND [id number] AT 1351 BY EC Performed at [facility], [address], [place], [place] [address]   Blood Culture (routine x 2)     Status: None (Preliminary result)   Collection Time: 02/17/24  2:50 PM   Specimen: BLOOD LEFT WRIST  Result Value Ref Range Status   Specimen Description   Final    BLOOD LEFT WRIST BOTTLES DRAWN AEROBIC AND ANAEROBIC   Special Requests Blood Culture adequate volume  Final   Culture   Final    NO GROWTH 2 DAYS Performed at [facility], [address], [place], [place] [address]    Report Status PENDING  Incomplete  C Difficile Quick Screen w PCR reflex     Status: Abnormal   Collection Time: 02/18/24  6:35 PM   Specimen: STOOL  Result Value Ref Range Status   C Diff antigen POSITIVE (A) NEGATIVE Final   C Diff toxin NEGATIVE NEGATIVE Final   C Diff interpretation Results are indeterminate. See PCR results.  Final    Comment: Performed at [facility], [address], [place], [place] [address]  C. Diff by PCR, Reflexed     Status: None   Collection Time: 02/18/24  6:35 PM  Result Value Ref Range Status   Toxigenic C. Difficile by PCR NEGATIVE  NEGATIVE Final    Comment: Patient is colonized with non toxigenic C. difficile. May not need treatment unless significant symptoms are present.   Hypervirulent Strain PRESUMPTIVE NEGATIVE PRESUMPTIVE NEGATIVE Final    Comment: Performed at [facility], [address] [place], [place] [address]  Respiratory (~20  pathogens) panel by PCR     Status: None   Collection Time: 02/19/24 12:58 AM   Specimen: Nasopharyngeal Swab; Respiratory  Result Value Ref Range Status   Adenovirus NOT DETECTED NOT DETECTED Final   Coronavirus 229E NOT DETECTED NOT DETECTED Final    Comment: (NOTE) The Coronavirus on the Respiratory Panel, DOES NOT test for the novel  Coronavirus (2019 nCoV)    Coronavirus HKU1 NOT DETECTED NOT DETECTED Final   Coronavirus NL63 NOT DETECTED NOT DETECTED Final   Coronavirus OC43 NOT DETECTED NOT DETECTED Final   Metapneumovirus NOT DETECTED NOT DETECTED Final   Rhinovirus / Enterovirus NOT DETECTED NOT DETECTED Final   Influenza A NOT DETECTED NOT DETECTED Final   Influenza B NOT DETECTED NOT DETECTED Final   Parainfluenza Virus 1 NOT DETECTED NOT DETECTED Final   Parainfluenza Virus 2 NOT DETECTED NOT DETECTED Final   Parainfluenza Virus 3 NOT DETECTED NOT DETECTED Final   Parainfluenza Virus 4 NOT DETECTED NOT DETECTED Final   Respiratory Syncytial Virus NOT DETECTED NOT DETECTED Final   Bordetella pertussis NOT DETECTED NOT DETECTED Final   Bordetella Parapertussis NOT DETECTED NOT DETECTED Final   Chlamydophila pneumoniae NOT DETECTED NOT DETECTED Final   Mycoplasma pneumoniae NOT DETECTED NOT DETECTED Final    Comment: Performed at [facility], [address] [place], [place] [address]         Radiology Studies: MR FOOT RIGHT WO CONTRAST Result Date: 02/17/2024 CLINICAL DATA:  Open wound involving the medial aspect of the hindfoot. EXAM: MRI OF THE RIGHT FOREFOOT WITHOUT CONTRAST TECHNIQUE: Multiplanar, multisequence MR imaging of the right foot  was performed. No intravenous contrast was administered. COMPARISON:  Radiographs, same date. FINDINGS: Prior mid distal amputations of the second through fifth toes. Prior amputation of the first ray. Abnormal T1 and T2 signal intensity in the medial cuneiform consistent with osteomyelitis. There is also an adjacent complex fluid collection measuring 3.2 x 2.4 x 2.1 cm and consistent with an abscess. I do not see any definite findings for osteomyelitis involving the other cuneiform is or metatarsals. Open wound noted along the medial aspect of the hindfoot with an underlying large complex abscess involving the subcutaneous tissues and also the underlying musculature. This measures approximately 5.1 x 3.9 x 2.9 cm. Adjacent diffuse osteomyelitis involving the calcaneus. Diffuse cellulitis and myofasciitis. Small tibiotalar joint effusion but no findings suspicious for septic arthritis. The subtalar joints are maintained. Severe chronic Achilles tendinopathy. The plantar fascia is surrounded by abscess and is torn IMPRESSION: 1. Open wound along the medial aspect of the hindfoot with an underlying large complex abscess involving the subcutaneous tissues and also the underlying musculature. 2. Osteomyelitis involving the medial cuneiform and the calcaneus. 3. Diffuse cellulitis and myofasciitis. 4. Small tibiotalar joint effusion but no findings suspicious for septic arthritis. 5. Severe chronic Achilles tendinopathy. Electronically Signed   By: MYRTIS Stammer M.D.   On: 02/17/2024 19:08   DG Foot Complete Right Result Date: 02/17/2024 CLINICAL DATA:  Purulent discharge from surgical wound. EXAM: RIGHT FOOT COMPLETE - 3+ VIEW COMPARISON:  01/16/2024. FINDINGS: First ray amputation with additional amputations of the second through fifth rays at the metatarsal necks. Soft tissue wound overlies the distal second metatarsal. No osseous erosion to suggest osteomyelitis. No radiopaque foreign body. Prominent calcaneal  spurs. IMPRESSION: 1. Soft tissue wound over the distal second metatarsal. No underlying cortical erosion to suggest osteomyelitis. 2. First through fifth ray amputations, as detailed above. Electronically Signed  By: Newell Eke M.D.   On: 02/17/2024 16:06   DG Chest Port 1 View Result Date: 02/17/2024 CLINICAL DATA:  Weakness, purulent discharge from surgical wound on foot. EXAM: PORTABLE CHEST 1 VIEW COMPARISON:  01/16/2024. FINDINGS: Trachea is midline. Heart size normal. Thoracic aorta is calcified. Minimal streaky atelectasis or scarring in the left lung base. No pleural fluid. IMPRESSION: Minimal streaky atelectasis or scarring in the left lung base. Electronically Signed   By: Newell Eke M.D.   On: 02/17/2024 16:04           LOS: 2 days   Time spent= 35 mins    Burgess JAYSON Dare, MD [facility]  If 7PM-7AM, please contact night-coverage  02/19/2024, 12:06 PM  "

## 2024-02-19 NOTE — Consult Note (Signed)
 "    [facility]    Date of Admission:  02/17/2024     Total days of antibiotics 3               Reason for Consult: MRSA Bacteremia  Referring Provider: Sharyon  Primary Care Provider: Marvine Rush, MD   ASSESSMENT:  [name] is a 74 year old Caucasian male with diabetes, hypertension, and recent right first ray amputation secondary to chronic osteomyelitis presenting to the hospital with acute onset nausea and vomiting and found to have MRSA bacteremia with MRI imaging showing osteomyelitis of the calcaneus and cuneiform.  Orthopedics recommending below-knee amputation.  [name] blood cultures from 02/19/2024 are pending.  TTE without evidence of endocarditis and will likely require TEE.  Continue current dose of daptomycin  and ampicillin -sulbactam.  Therapeutic drug monitoring of CK levels while on daptomycin .  Continues to have diarrhea which he had previously during admission in December and was found to be noninfectious.  C. Diff antigen positive but toxin and PCR negative with GI pathogen panel pending. He does have a generalized maculopapular rash on his trunk and extremities which started prior to administration of antibiotics and seems to be improving.  Monitor cultures for clearance of bacteremia.  Surgical intervention planned for 02/21/2024.  Contact precautions for MRSA.  Remaining medical and supportive care per internal medicine.  PLAN:  Continue current dose of daptomycin  and ampicillin -sulbactam. Monitor blood cultures for clearance of bacteremia. Therapeutic drug monitoring of CK levels while on daptomycin . Surgical intervention per orthopedics currently planned for 02/21/2024. Will need TEE to rule out endocarditis. Contact and Enteric precautions for MRSA. Remaining medical and supportive care per internal medicine.   Principal Problem:   Diabetic foot infection (HCC) Active Problems:   Hypertension   Gout   GERD (gastroesophageal  reflux disease)   Anxiety   Intellectual disability   Type 2 diabetes with nephropathy (HCC)   Amputated great toe of right foot   Chronic kidney disease, stage III (moderate) (HCC)   Stage 3a chronic kidney disease (HCC)   Sepsis (HCC)   Cellulitis of right foot   AKI (acute kidney injury)   Cutaneous abscess of right foot   History of transmetatarsal amputation of right foot (HCC)   Osteomyelitis of right foot (HCC)   Lactic acidosis   H/O Hemorrhagic stroke   Gastroenteritis   Bacteremia   Septic arthritis of right foot    benzonatate   100 mg Oral TID   heparin   5,000 Units Subcutaneous Q8H   insulin  aspart  0-15 Units Subcutaneous Q4H   insulin  aspart  3 Units Subcutaneous TID WC   insulin  glargine-yfgn  10 Units Subcutaneous QHS   metoprolol  succinate  50 mg Oral Daily     HPI: [name] is a 74 y.o. male with previous medical history of stroke, type 2 diabetes, hypertension, chronic kidney disease stage III, and chronic osteomyelitis status post amputation of the right foot presenting to the ED with generalized weakness, nausea, vomiting, and diarrhea.  [name] was recently admitted to the hospital on 01/16/2024 with chronic osteomyelitis of the distal right first metatarsal undergoing first ray amputation on 01/17/2024.  He was noted to have diarrhea at that time that was negative for C. difficile and the GI pathogen panel.  Was not discharged on any antibiotics.  Now presenting to the hospital with acute onset nausea and vomiting.  Initially afebrile with no leukocytosis.  Chest x-ray with atelectasis.  Right foot x-ray  with soft tissue wound over the distal second metatarsal and no underlying cortical erosion.  MRI right foot with osteomyelitis involving the medial cuneiform and the calcaneus.  Orthopedics consulted with recommendation for below-knee amputation.  Initially started on broad-spectrum antibiotics with vancomycin  and ceftriaxone  with current antimicrobial  therapy with ampicillin /sulbactam and daptomycin .  Blood cultures are positive for MRSA.  Plan for surgical intervention on Wednesday, 02/21/2024.  TTE with no evidence of endocarditis.  He does not have any central lines or artificial joints nor hardware.    Review of Systems: Review of Systems  Constitutional:  Negative for chills, fever and weight loss.  Respiratory:  Negative for cough, shortness of breath and wheezing.   Cardiovascular:  Negative for chest pain and leg swelling.  Gastrointestinal:  Negative for abdominal pain, constipation, diarrhea, nausea and vomiting.  Skin:  Positive for rash.     Past Medical History:  Diagnosis Date   Anemia    Anxiety    Arthritis    Colon polyps    Depression    Diabetes mellitus without complication (HCC)    DM (diabetes mellitus) (HCC) 01/30/2019   Fatty liver    GERD (gastroesophageal reflux disease)    Gout    High cholesterol    History of echocardiogram 09/14/2011   EF >55%; borderline concentric LVH;    History of stress test 06/21/2010   exercise; normal study   Hypertension    MET TEST 08/15/2011   moderate peak VO2 limitation at 66% predicted; mod cardiac impairment with low SV & low anaerobic threshold, mild chronotropic incompetence; low risk   Nausea    Osteomyelitis (HCC)    right great toe   Renal disorder    Renal insufficiency    Speech impediment    Thrombocytopenia    PMH    Social History[1]  Family History  Problem Relation Age of Onset   Colon cancer Brother     Allergies[2]  OBJECTIVE: Blood pressure (!) 133/58, pulse 78, temperature 97.9 F (36.6 C), resp. rate 18, height 6' 2 (1.88 m), weight 90.2 kg, SpO2 96%.  Physical Exam Constitutional:      General: He is not in acute distress.    Appearance: He is well-developed.  Cardiovascular:     Rate and Rhythm: Normal rate and regular rhythm.     Heart sounds: Normal heart sounds.  Pulmonary:     Effort: Pulmonary effort is normal.      Breath sounds: Normal breath sounds.  Skin:    General: Skin is warm and dry.     Findings: Rash (Generalized maculopapual rash) present.  Neurological:     Mental Status: He is alert and oriented to person, place, and time.     Lab Results Lab Results  Component Value Date   WBC 2.7 (L) 02/19/2024   HGB 9.7 (L) 02/19/2024   HCT 29.6 (L) 02/19/2024   MCV 82.0 02/19/2024   PLT 257 02/19/2024    Lab Results  Component Value Date   CREATININE 4.33 (H) 02/19/2024   BUN 52 (H) 02/19/2024   NA 136 02/19/2024   K 3.4 (L) 02/19/2024   CL 103 02/19/2024   CO2 22 02/19/2024    Lab Results  Component Value Date   ALT 20 02/17/2024   AST 19 02/17/2024   ALKPHOS 77 02/17/2024   BILITOT 0.7 02/17/2024     Microbiology: Recent Results (from the past 240 hours)  Resp panel by RT-PCR (RSV, Flu A&B, Covid) Anterior Nasal  Swab     Status: None   Collection Time: 02/17/24  2:21 PM   Specimen: Anterior Nasal Swab  Result Value Ref Range Status   SARS Coronavirus 2 by RT PCR NEGATIVE NEGATIVE Final    Comment: (NOTE) SARS-CoV-2 target nucleic acids are NOT DETECTED.  The SARS-CoV-2 RNA is generally detectable in upper respiratory specimens during the acute phase of infection. The lowest concentration of SARS-CoV-2 viral copies this assay can detect is 138 copies/mL. A negative result does not preclude SARS-Cov-2 infection and should not be used as the sole basis for treatment or other patient management decisions. A negative result may occur with  improper specimen collection/handling, submission of specimen other than nasopharyngeal swab, presence of viral mutation(s) within the areas targeted by this assay, and inadequate number of viral copies(<138 copies/mL). A negative result must be combined with clinical observations, patient history, and epidemiological information. The expected result is Negative.  Fact Sheet for Patients:   bloggercourse.com  Fact Sheet for Healthcare Providers:  seriousbroker.it  This test is no t yet approved or cleared by the [place]  FDA and  has been authorized for detection and/or diagnosis of SARS-CoV-2 by FDA under an Emergency Use Authorization (EUA). This EUA will remain  in effect (meaning this test can be used) for the duration of the COVID-19 declaration under Section 564(b)(1) of the Act, 21 U.S.C.section 360bbb-3(b)(1), unless the authorization is terminated  or revoked sooner.       Influenza A by PCR NEGATIVE NEGATIVE Final   Influenza B by PCR NEGATIVE NEGATIVE Final    Comment: (NOTE) The Xpert Xpress SARS-CoV-2/FLU/RSV plus assay is intended as an aid in the diagnosis of influenza from Nasopharyngeal swab specimens and should not be used as a sole basis for treatment. Nasal washings and aspirates are unacceptable for Xpert Xpress SARS-CoV-2/FLU/RSV testing.  Fact Sheet for Patients: bloggercourse.com  Fact Sheet for Healthcare Providers: seriousbroker.it  This test is not yet approved or cleared by the [place]  FDA and has been authorized for detection and/or diagnosis of SARS-CoV-2 by FDA under an Emergency Use Authorization (EUA). This EUA will remain in effect (meaning this test can be used) for the duration of the COVID-19 declaration under Section 564(b)(1) of the Act, 21 U.S.C. section 360bbb-3(b)(1), unless the authorization is terminated or revoked.     Resp Syncytial Virus by PCR NEGATIVE NEGATIVE Final    Comment: (NOTE) Fact Sheet for Patients: bloggercourse.com  Fact Sheet for Healthcare Providers: seriousbroker.it  This test is not yet approved or cleared by the [place]  FDA and has been authorized for detection and/or diagnosis of SARS-CoV-2 by FDA under an Emergency Use  Authorization (EUA). This EUA will remain in effect (meaning this test can be used) for the duration of the COVID-19 declaration under Section 564(b)(1) of the Act, 21 U.S.C. section 360bbb-3(b)(1), unless the authorization is terminated or revoked.  Performed at [facility], [address] [place], [place] [address]   Blood Culture (routine x 2)     Status: Abnormal (Preliminary result)   Collection Time: 02/17/24  2:48 PM   Specimen: BLOOD LEFT ARM  Result Value Ref Range Status   Specimen Description   Final    BLOOD LEFT ARM BOTTLES DRAWN AEROBIC AND ANAEROBIC Performed at [facility], [address] [place], [place] [address]    Special Requests   Final    Blood Culture adequate volume Performed at [facility], [address] [place], [place] [address]  Culture  Setup Time   Final    GRAM POSITIVE COCCI ANAEROBIC BOTTLE ONLY Gram Stain Report Called to,Read Back By and Verified With: C. FULCHER ON 02/18/2024 @10 :13AM BY T. HAMER  CRITICAL RESULT CALLED TO, READ BACK BY AND VERIFIED WITH: RN DOROTHYANN ISLAND [id number] AT 1351 BY EC    Culture (A)  Final    STAPHYLOCOCCUS AUREUS SUSCEPTIBILITIES TO FOLLOW Performed at [facility], [address] [place], [place] [address]    Report Status PENDING  Incomplete  Blood Culture ID Panel (Reflexed)     Status: Abnormal   Collection Time: 02/17/24  2:48 PM  Result Value Ref Range Status   Enterococcus faecalis NOT DETECTED NOT DETECTED Final   Enterococcus Faecium NOT DETECTED NOT DETECTED Final   Listeria monocytogenes NOT DETECTED NOT DETECTED Final   Staphylococcus species DETECTED (A) NOT DETECTED Final    Comment: CRITICAL RESULT CALLED TO, READ BACK BY AND VERIFIED WITH: RN DOROTHYANN ISLAND [id number] AT 1351 BY EC    Staphylococcus aureus (BCID) DETECTED (A) NOT DETECTED Final    Comment: Methicillin (oxacillin)-resistant Staphylococcus aureus (MRSA). MRSA is predictably resistant to beta-lactam antibiotics  (except ceftaroline). Preferred therapy is vancomycin  unless clinically contraindicated. Patient requires contact precautions if  hospitalized. CRITICAL RESULT CALLED TO, READ BACK BY AND VERIFIED WITH: RN DOROTHYANN ISLAND [id number] AT 1351 BY EC    Staphylococcus epidermidis NOT DETECTED NOT DETECTED Final   Staphylococcus lugdunensis NOT DETECTED NOT DETECTED Final   Streptococcus species NOT DETECTED NOT DETECTED Final   Streptococcus agalactiae NOT DETECTED NOT DETECTED Final   Streptococcus pneumoniae NOT DETECTED NOT DETECTED Final   Streptococcus pyogenes NOT DETECTED NOT DETECTED Final   A.calcoaceticus-baumannii NOT DETECTED NOT DETECTED Final   Bacteroides fragilis NOT DETECTED NOT DETECTED Final   Enterobacterales NOT DETECTED NOT DETECTED Final   Enterobacter cloacae complex NOT DETECTED NOT DETECTED Final   Escherichia coli NOT DETECTED NOT DETECTED Final   Klebsiella aerogenes NOT DETECTED NOT DETECTED Final   Klebsiella oxytoca NOT DETECTED NOT DETECTED Final   Klebsiella pneumoniae NOT DETECTED NOT DETECTED Final   Proteus species NOT DETECTED NOT DETECTED Final   Salmonella species NOT DETECTED NOT DETECTED Final   Serratia marcescens NOT DETECTED NOT DETECTED Final   Haemophilus influenzae NOT DETECTED NOT DETECTED Final   Neisseria meningitidis NOT DETECTED NOT DETECTED Final   Pseudomonas aeruginosa NOT DETECTED NOT DETECTED Final   Stenotrophomonas maltophilia NOT DETECTED NOT DETECTED Final   Candida albicans NOT DETECTED NOT DETECTED Final   Candida auris NOT DETECTED NOT DETECTED Final   Candida glabrata NOT DETECTED NOT DETECTED Final   Candida krusei NOT DETECTED NOT DETECTED Final   Candida parapsilosis NOT DETECTED NOT DETECTED Final   Candida tropicalis NOT DETECTED NOT DETECTED Final   Cryptococcus neoformans/gattii NOT DETECTED NOT DETECTED Final   Meth resistant mecA/C and MREJ DETECTED (A) NOT DETECTED Final    Comment: CRITICAL RESULT CALLED TO,  READ BACK BY AND VERIFIED WITH: RN DOROTHYANN ISLAND [id number] AT 1351 BY EC Performed at [facility], [address] [place], [place] [address]   Blood Culture (routine x 2)     Status: None (Preliminary result)   Collection Time: 02/17/24  2:50 PM   Specimen: BLOOD LEFT WRIST  Result Value Ref Range Status   Specimen Description   Final    BLOOD LEFT WRIST BOTTLES DRAWN AEROBIC AND ANAEROBIC   Special Requests Blood Culture adequate volume  Final  Culture   Final    NO GROWTH 2 DAYS Performed at [facility], [address], [place], [place] [address]    Report Status PENDING  Incomplete  C Difficile Quick Screen w PCR reflex     Status: Abnormal   Collection Time: 02/18/24  6:35 PM   Specimen: Stool  Result Value Ref Range Status   C Diff antigen POSITIVE (A) NEGATIVE Final   C Diff toxin NEGATIVE NEGATIVE Final   C Diff interpretation Results are indeterminate. See PCR results.  Final    Comment: Performed at [facility], [address], [place], [place] [address]  C. Diff by PCR, Reflexed     Status: None   Collection Time: 02/18/24  6:35 PM  Result Value Ref Range Status   Toxigenic C. Difficile by PCR NEGATIVE NEGATIVE Final    Comment: Patient is colonized with non toxigenic C. difficile. May not need treatment unless significant symptoms are present.   Hypervirulent Strain PRESUMPTIVE NEGATIVE PRESUMPTIVE NEGATIVE Final    Comment: Performed at [facility], [address] [place], [place] [address]  Respiratory (~20 pathogens) panel by PCR     Status: None   Collection Time: 02/19/24 12:58 AM   Specimen: Nasopharyngeal Swab; Respiratory  Result Value Ref Range Status   Adenovirus NOT DETECTED NOT DETECTED Final   Coronavirus 229E NOT DETECTED NOT DETECTED Final    Comment: (NOTE) The Coronavirus on the Respiratory Panel, DOES NOT test for the novel  Coronavirus (2019 nCoV)    Coronavirus HKU1 NOT DETECTED NOT DETECTED Final   Coronavirus NL63 NOT  DETECTED NOT DETECTED Final   Coronavirus OC43 NOT DETECTED NOT DETECTED Final   Metapneumovirus NOT DETECTED NOT DETECTED Final   Rhinovirus / Enterovirus NOT DETECTED NOT DETECTED Final   Influenza A NOT DETECTED NOT DETECTED Final   Influenza B NOT DETECTED NOT DETECTED Final   Parainfluenza Virus 1 NOT DETECTED NOT DETECTED Final   Parainfluenza Virus 2 NOT DETECTED NOT DETECTED Final   Parainfluenza Virus 3 NOT DETECTED NOT DETECTED Final   Parainfluenza Virus 4 NOT DETECTED NOT DETECTED Final   Respiratory Syncytial Virus NOT DETECTED NOT DETECTED Final   Bordetella pertussis NOT DETECTED NOT DETECTED Final   Bordetella Parapertussis NOT DETECTED NOT DETECTED Final   Chlamydophila pneumoniae NOT DETECTED NOT DETECTED Final   Mycoplasma pneumoniae NOT DETECTED NOT DETECTED Final    Comment: Performed at [facility], [address] [place], [place] [address]     Cathlyn July, NP [facility]  02/19/2024  3:37 PM     [1]  Social History Tobacco Use   Smoking status: Never   Smokeless tobacco: Never  Vaping Use   Vaping status: Never Used  Substance Use Topics   Alcohol use: No   Drug use: No  [2]  Allergies Allergen Reactions   Ivp Dye [Iodinated Contrast Media] Other (See Comments)    Speed heart rate up    Penicillins Rash    Pt tolerated amoxicillin  oral challenge   Tetanus Toxoid-Containing Vaccines Rash   "

## 2024-02-19 NOTE — Progress Notes (Signed)
 2D Echocardiogram completed

## 2024-02-20 DIAGNOSIS — E11628 Type 2 diabetes mellitus with other skin complications: Secondary | ICD-10-CM | POA: Diagnosis not present

## 2024-02-20 DIAGNOSIS — L089 Local infection of the skin and subcutaneous tissue, unspecified: Secondary | ICD-10-CM | POA: Diagnosis not present

## 2024-02-20 LAB — PHOSPHORUS: Phosphorus: 3.7 mg/dL (ref 2.5–4.6)

## 2024-02-20 LAB — CULTURE, BLOOD (ROUTINE X 2): Special Requests: ADEQUATE

## 2024-02-20 LAB — CBC
HCT: 26 % — ABNORMAL LOW (ref 39.0–52.0)
Hemoglobin: 8.7 g/dL — ABNORMAL LOW (ref 13.0–17.0)
MCH: 26.9 pg (ref 26.0–34.0)
MCHC: 33.5 g/dL (ref 30.0–36.0)
MCV: 80.5 fL (ref 80.0–100.0)
Platelets: 255 K/uL (ref 150–400)
RBC: 3.23 MIL/uL — ABNORMAL LOW (ref 4.22–5.81)
RDW: 14.8 % (ref 11.5–15.5)
WBC: 2.6 K/uL — ABNORMAL LOW (ref 4.0–10.5)
nRBC: 0 % (ref 0.0–0.2)

## 2024-02-20 LAB — BASIC METABOLIC PANEL WITH GFR
Anion gap: 10 (ref 5–15)
BUN: 54 mg/dL — ABNORMAL HIGH (ref 8–23)
CO2: 22 mmol/L (ref 22–32)
Calcium: 8.1 mg/dL — ABNORMAL LOW (ref 8.9–10.3)
Chloride: 104 mmol/L (ref 98–111)
Creatinine, Ser: 3.8 mg/dL — ABNORMAL HIGH (ref 0.61–1.24)
GFR, Estimated: 16 mL/min — ABNORMAL LOW
Glucose, Bld: 147 mg/dL — ABNORMAL HIGH (ref 70–99)
Potassium: 3.6 mmol/L (ref 3.5–5.1)
Sodium: 136 mmol/L (ref 135–145)

## 2024-02-20 LAB — GLUCOSE, CAPILLARY
Glucose-Capillary: 117 mg/dL — ABNORMAL HIGH (ref 70–99)
Glucose-Capillary: 125 mg/dL — ABNORMAL HIGH (ref 70–99)
Glucose-Capillary: 122 mg/dL — ABNORMAL HIGH (ref 70–99)
Glucose-Capillary: 124 mg/dL — ABNORMAL HIGH (ref 70–99)
Glucose-Capillary: 148 mg/dL — ABNORMAL HIGH (ref 70–99)

## 2024-02-20 LAB — URINE CULTURE: Culture: NO GROWTH

## 2024-02-20 LAB — MAGNESIUM: Magnesium: 1.5 mg/dL — ABNORMAL LOW (ref 1.7–2.4)

## 2024-02-20 LAB — SURGICAL PCR SCREEN
MRSA, PCR: POSITIVE — AB
Staphylococcus aureus: POSITIVE — AB

## 2024-02-20 LAB — CK: Total CK: 17 U/L — ABNORMAL LOW (ref 49–397)

## 2024-02-20 MED ORDER — MAGNESIUM OXIDE -MG SUPPLEMENT 400 (240 MG) MG PO TABS
800.0000 mg | ORAL_TABLET | Freq: Once | ORAL | Status: AC
  Administered 2024-02-20: 800 mg via ORAL
  Filled 2024-02-20: qty 2

## 2024-02-20 MED ORDER — POVIDONE-IODINE 10 % EX SWAB
2.0000 | Freq: Once | CUTANEOUS | Status: AC
  Administered 2024-02-21: 2 via TOPICAL

## 2024-02-20 MED ORDER — CHLORHEXIDINE GLUCONATE 4 % EX SOLN
60.0000 mL | Freq: Once | CUTANEOUS | Status: AC
  Administered 2024-02-21: 4 via TOPICAL
  Filled 2024-02-20: qty 60

## 2024-02-20 MED ORDER — CEFAZOLIN SODIUM-DEXTROSE 2-4 GM/100ML-% IV SOLN: 2.0000 g | INTRAVENOUS | Status: DC

## 2024-02-20 MED ORDER — CHLORHEXIDINE GLUCONATE CLOTH 2 % EX PADS
6.0000 | MEDICATED_PAD | Freq: Every day | CUTANEOUS | Status: AC
  Administered 2024-02-20 – 2024-02-24 (×3): 6 via TOPICAL

## 2024-02-20 MED ORDER — MUPIROCIN 2 % EX OINT
1.0000 | TOPICAL_OINTMENT | Freq: Two times a day (BID) | CUTANEOUS | Status: AC
  Administered 2024-02-20 – 2024-02-25 (×10): 1 via NASAL
  Filled 2024-02-20 (×2): qty 22

## 2024-02-20 MED ORDER — POTASSIUM CHLORIDE CRYS ER 20 MEQ PO TBCR
40.0000 meq | EXTENDED_RELEASE_TABLET | Freq: Once | ORAL | Status: AC
  Administered 2024-02-20: 40 meq via ORAL
  Filled 2024-02-20: qty 2

## 2024-02-20 MED ORDER — SODIUM CHLORIDE 0.9 % IV SOLN: INTRAVENOUS | Status: DC

## 2024-02-20 NOTE — Plan of Care (Signed)
  Problem: Education: Goal: Knowledge of General Education information will improve Description: Including pain rating scale, medication(s)/side effects and non-pharmacologic comfort measures Outcome: Progressing   Problem: Clinical Measurements: Goal: Ability to maintain clinical measurements within normal limits will improve Outcome: Progressing Goal: Respiratory complications will improve Outcome: Progressing   Problem: Pain Managment: Goal: General experience of comfort will improve and/or be controlled Outcome: Progressing

## 2024-02-20 NOTE — Progress Notes (Signed)
" ° °  [facility] has been requested to perform a transesophageal echocardiogram on [name] for MRSA Bacteremia.     The patient does NOT have any absolute or relative contraindications to a Transesophageal Echocardiogram (TEE).  The patient has: No other conditions that may impact this procedure.    After careful review of history and examination, the risks and benefits of transesophageal echocardiogram have been explained including risks of esophageal damage, perforation (1:10,000 risk), bleeding, pharyngeal hematoma as well as other potential complications associated with conscious sedation including aspiration, arrhythmia, respiratory failure and death. Alternatives to treatment were discussed, questions were answered. Patient is willing to proceed.   Bonney Morse Clause, PA-C  02/20/2024 4:33 PM   "

## 2024-02-20 NOTE — Progress Notes (Signed)
 " PROGRESS NOTE    [name]  FMW:[id number] DOB: Aug 07, 1950 DOA: 02/17/2024 PCP: Marvine Rush, MD    Brief Narrative:   74 year old with history of CVA with residual slurred speech, CKD 3 AA, chronic osteomyelitis s/p right TMA, HTN, DM 2 admitted to the hospital for generally feeling sick and having nausea/vomiting.  Workup showed concerns of sepsis with worsening right diabetic foot infection with abscess.  Dr. Harden was consulted and patient was transferred from [facility] to [facility] for surgical intervention  Assessment & Plan:   Sepsis secondary to right diabetic foot infection associated with abscess and osteomyelitis Gram-positive bacteremia -Orthopedic consulted.  For now continue IV antibiotics.  Dr. Harden is planning on right sided BKA for better source control. - Abx per ID -Repeat blood cultures 1/5 -Echocardiogram showing preserved EF 65%, grade 1 DD.  No evidence of vegetation -Will need TEE   Anemia - Most likely chronic.  Initial labs likely hemoconcentrated due to dehydration.  Admission hemoglobin 12 now drifted down to 8.7.  No signs of obvious bleeding     Type 2 diabetes mellitus with neurological complications, uncontrolled with hyperglycemia -A1c 9.8.  Sliding scale and Accu-Chek.  Will adjust as necessary   AKI on CKD stage 3b - Baseline creatinine around 2.0, admission creatinine 4.82 with IV fluids renal function is slowly improving, creatinine 3.8. - Renal ultrasound does not show acute pathology   Gastroenteritis -Improved.  C. difficile antigen positive per toxigenic PCR is negative.  Supportive care   Diffuse macular rash  -Had mild symptoms, unclear.  Improving   Essential hypertension  Sinus tachycardia -Toprol -XL IV as needed  Hypomagnesemia/hypokalemia - As needed repletion    DVT prophylaxis: heparin  Code Status: Full  Family Communication:  Disposition: Multiple issues on going    PT Follow up Recs:    Subjective: Feeling okay no complaints at this time Remains afebrile Slurred speech which has been chronic but very able to understand basic communication with some difficulty   Examination:  General exam: Appears calm and comfortable  Respiratory system: Clear to auscultation. Respiratory effort normal. Cardiovascular system: S1 & S2 heard, RRR. No JVD, murmurs, rubs, gallops or clicks. No pedal edema. Gastrointestinal system: Abdomen is nondistended, soft and nontender. No organomegaly or masses felt. Normal bowel sounds heard. Central nervous system: Alert and oriented. No focal neurological deficits.  Chronic slurred speech Extremities: Symmetric 5 x 5 power.  Lower extremity dressing noted Skin: Diffuse maculopapular rash appears to be better Psychiatry: Judgement and insight appear normal. Mood & affect appropriate.                Diet Orders (From admission, onward)     Start     Ordered   02/21/24 0430  Diet NPO time specified  Diet effective ____        02/20/24 0810   02/19/24 0724  Diet Carb Modified  Diet effective now       Question Answer Comment  Calorie Level Medium 1600-2000   Fluid consistency: Thin      02/19/24 0723            Objective: Vitals:   02/19/24 2137 02/20/24 0025 02/20/24 0438 02/20/24 0747  BP: (!) 147/70 (!) 118/55 (!) 113/51 130/72  Pulse: 86 85 88 88  Resp: 18  18 18   Temp: 98.4 F (36.9 C) 98 F (36.7 C) 99.1 F (37.3 C) 98.4 F (36.9 C)  TempSrc: Oral  Oral   SpO2:  100% 95% 96% 97%  Weight:      Height:        Intake/Output Summary (Last 24 hours) at 02/20/2024 1025 Last data filed at 02/20/2024 0900 Gross per 24 hour  Intake 1892.67 ml  Output 700 ml  Net 1192.67 ml   Filed Weights   02/17/24 1420  Weight: 90.2 kg    Scheduled Meds:  benzonatate   100 mg Oral TID   chlorhexidine   60 mL Topical Once   heparin   5,000 Units Subcutaneous Q8H   insulin  aspart  0-15 Units Subcutaneous Q4H   insulin  aspart   3 Units Subcutaneous TID WC   insulin  glargine-yfgn  10 Units Subcutaneous QHS   magnesium  oxide  800 mg Oral Once   metoprolol  succinate  50 mg Oral Daily   potassium chloride   40 mEq Oral Once   povidone-iodine   2 Application Topical Once   Continuous Infusions:  sodium chloride  75 mL/hr at 02/20/24 0830   ampicillin -sulbactam (UNASYN ) IV 3 g (02/20/24 0831)    ceFAZolin  (ANCEF ) IV     DAPTOmycin  700 mg (02/19/24 1234)    Nutritional status     Body mass index is 25.54 kg/m.  Data Reviewed:   CBC: Recent Labs  Lab 02/17/24 1448 02/18/24 0513 02/19/24 0241 02/20/24 0009  WBC 4.8 2.7* 2.7* 2.6*  NEUTROABS 3.4  --  1.7  --   HGB 12.6* 9.5* 9.7* 8.7*  HCT 38.8* 29.2* 29.6* 26.0*  MCV 81.5 82.3 82.0 80.5  PLT 364 273 257 255   Basic Metabolic Panel: Recent Labs  Lab 02/17/24 1448 02/18/24 0513 02/19/24 0241 02/20/24 0009  NA 135 136 136 136  K 3.9 3.7 3.4* 3.6  CL 94* 99 103 104  CO2 20* 26 22 22   GLUCOSE 267* 128* 103* 147*  BUN 51* 50* 52* 54*  CREATININE 4.82* 4.53* 4.33* 3.80*  CALCIUM  8.7* 7.9* 7.8* 8.1*  MG  --   --   --  1.5*  PHOS  --   --   --  3.7   GFR: Estimated Creatinine Clearance: 20.4 mL/min (A) (by C-G formula based on SCr of 3.8 mg/dL (H)). Liver Function Tests: Recent Labs  Lab 02/17/24 1448  AST 19  ALT 20  ALKPHOS 77  BILITOT 0.7  PROT 7.9  ALBUMIN 3.6   No results for input(s): LIPASE, AMYLASE in the last 168 hours. No results for input(s): AMMONIA in the last 168 hours. Coagulation Profile: Recent Labs  Lab 02/17/24 1448  INR 1.1   Cardiac Enzymes: Recent Labs  Lab 02/20/24 0009  CKTOTAL 17*   BNP (last 3 results) No results for input(s): PROBNP in the last 8760 hours. HbA1C: No results for input(s): HGBA1C in the last 72 hours. CBG: Recent Labs  Lab 02/19/24 1147 02/19/24 1534 02/19/24 2133 02/20/24 0436 02/20/24 0716  GLUCAP 150* 142* 164* 117* 125*   Lipid Profile: No results for  input(s): CHOL, HDL, LDLCALC, TRIG, CHOLHDL, LDLDIRECT in the last 72 hours. Thyroid  Function Tests: No results for input(s): TSH, T4TOTAL, FREET4, T3FREE, THYROIDAB in the last 72 hours. Anemia Panel: No results for input(s): VITAMINB12, FOLATE, FERRITIN, TIBC, IRON, RETICCTPCT in the last 72 hours. Sepsis Labs: Recent Labs  Lab 02/17/24 1448 02/17/24 1627 02/18/24 0924  LATICACIDVEN 2.8* 2.5* 1.4    Recent Results (from the past 240 hours)  Resp panel by RT-PCR (RSV, Flu A&B, Covid) Anterior Nasal Swab     Status: None   Collection Time: 02/17/24  2:21 PM  Specimen: Anterior Nasal Swab  Result Value Ref Range Status   SARS Coronavirus 2 by RT PCR NEGATIVE NEGATIVE Final    Comment: (NOTE) SARS-CoV-2 target nucleic acids are NOT DETECTED.  The SARS-CoV-2 RNA is generally detectable in upper respiratory specimens during the acute phase of infection. The lowest concentration of SARS-CoV-2 viral copies this assay can detect is 138 copies/mL. A negative result does not preclude SARS-Cov-2 infection and should not be used as the sole basis for treatment or other patient management decisions. A negative result may occur with  improper specimen collection/handling, submission of specimen other than nasopharyngeal swab, presence of viral mutation(s) within the areas targeted by this assay, and inadequate number of viral copies(<138 copies/mL). A negative result must be combined with clinical observations, patient history, and epidemiological information. The expected result is Negative.  Fact Sheet for Patients:  bloggercourse.com  Fact Sheet for Healthcare Providers:  seriousbroker.it  This test is no t yet approved or cleared by the [place]  FDA and  has been authorized for detection and/or diagnosis of SARS-CoV-2 by FDA under an Emergency Use Authorization (EUA). This EUA will remain  in  effect (meaning this test can be used) for the duration of the COVID-19 declaration under Section 564(b)(1) of the Act, 21 U.S.C.section 360bbb-3(b)(1), unless the authorization is terminated  or revoked sooner.       Influenza A by PCR NEGATIVE NEGATIVE Final   Influenza B by PCR NEGATIVE NEGATIVE Final    Comment: (NOTE) The Xpert Xpress SARS-CoV-2/FLU/RSV plus assay is intended as an aid in the diagnosis of influenza from Nasopharyngeal swab specimens and should not be used as a sole basis for treatment. Nasal washings and aspirates are unacceptable for Xpert Xpress SARS-CoV-2/FLU/RSV testing.  Fact Sheet for Patients: bloggercourse.com  Fact Sheet for Healthcare Providers: seriousbroker.it  This test is not yet approved or cleared by the [place]  FDA and has been authorized for detection and/or diagnosis of SARS-CoV-2 by FDA under an Emergency Use Authorization (EUA). This EUA will remain in effect (meaning this test can be used) for the duration of the COVID-19 declaration under Section 564(b)(1) of the Act, 21 U.S.C. section 360bbb-3(b)(1), unless the authorization is terminated or revoked.     Resp Syncytial Virus by PCR NEGATIVE NEGATIVE Final    Comment: (NOTE) Fact Sheet for Patients: bloggercourse.com  Fact Sheet for Healthcare Providers: seriousbroker.it  This test is not yet approved or cleared by the [place]  FDA and has been authorized for detection and/or diagnosis of SARS-CoV-2 by FDA under an Emergency Use Authorization (EUA). This EUA will remain in effect (meaning this test can be used) for the duration of the COVID-19 declaration under Section 564(b)(1) of the Act, 21 U.S.C. section 360bbb-3(b)(1), unless the authorization is terminated or revoked.  Performed at [facility], [address], [place], [place] [address]   Blood Culture (routine  x 2)     Status: Abnormal   Collection Time: 02/17/24  2:48 PM   Specimen: BLOOD LEFT ARM  Result Value Ref Range Status   Specimen Description   Final    BLOOD LEFT ARM BOTTLES DRAWN AEROBIC AND ANAEROBIC Performed at [facility], [address], [place], [place] [address]    Special Requests   Final    Blood Culture adequate volume Performed at [facility], [address], [place], [place] [address]    Culture  Setup Time   Final    GRAM POSITIVE COCCI ANAEROBIC BOTTLE ONLY Gram Stain Report  Called to,Read Back By and Verified With: C. ANTONE ON 02/18/2024 @10 :13AM BY T. HAMER  CRITICAL RESULT CALLED TO, READ BACK BY AND VERIFIED WITH: RN DOROTHYANN ANTONE [id number] AT 1351 BY EC Performed at [facility], [address] [place], [place] [address]    Culture METHICILLIN RESISTANT STAPHYLOCOCCUS AUREUS (A)  Final   Report Status 02/20/2024 FINAL  Final   Organism ID, Bacteria METHICILLIN RESISTANT STAPHYLOCOCCUS AUREUS  Final      Susceptibility   Methicillin resistant staphylococcus aureus - MIC*    CIPROFLOXACIN  >=8 RESISTANT Resistant     ERYTHROMYCIN <=0.25 SENSITIVE Sensitive     GENTAMICIN <=0.5 SENSITIVE Sensitive     OXACILLIN >=4 RESISTANT Resistant     TETRACYCLINE <=1 SENSITIVE Sensitive     VANCOMYCIN  1 SENSITIVE Sensitive     TRIMETH /SULFA  <=10 SENSITIVE Sensitive     CLINDAMYCIN <=0.25 SENSITIVE Sensitive     RIFAMPIN <=0.5 SENSITIVE Sensitive     Inducible Clindamycin NEGATIVE Sensitive     LINEZOLID  2 SENSITIVE Sensitive     * METHICILLIN RESISTANT STAPHYLOCOCCUS AUREUS  Blood Culture ID Panel (Reflexed)     Status: Abnormal   Collection Time: 02/17/24  2:48 PM  Result Value Ref Range Status   Enterococcus faecalis NOT DETECTED NOT DETECTED Final   Enterococcus Faecium NOT DETECTED NOT DETECTED Final   Listeria monocytogenes NOT DETECTED NOT DETECTED Final   Staphylococcus species DETECTED (A) NOT DETECTED Final    Comment: CRITICAL RESULT CALLED TO,  READ BACK BY AND VERIFIED WITH: RN DOROTHYANN ANTONE [id number] AT 1351 BY EC    Staphylococcus aureus (BCID) DETECTED (A) NOT DETECTED Final    Comment: Methicillin (oxacillin)-resistant Staphylococcus aureus (MRSA). MRSA is predictably resistant to beta-lactam antibiotics (except ceftaroline). Preferred therapy is vancomycin  unless clinically contraindicated. Patient requires contact precautions if  hospitalized. CRITICAL RESULT CALLED TO, READ BACK BY AND VERIFIED WITH: RN DOROTHYANN ANTONE [id number] AT 1351 BY EC    Staphylococcus epidermidis NOT DETECTED NOT DETECTED Final   Staphylococcus lugdunensis NOT DETECTED NOT DETECTED Final   Streptococcus species NOT DETECTED NOT DETECTED Final   Streptococcus agalactiae NOT DETECTED NOT DETECTED Final   Streptococcus pneumoniae NOT DETECTED NOT DETECTED Final   Streptococcus pyogenes NOT DETECTED NOT DETECTED Final   A.calcoaceticus-baumannii NOT DETECTED NOT DETECTED Final   Bacteroides fragilis NOT DETECTED NOT DETECTED Final   Enterobacterales NOT DETECTED NOT DETECTED Final   Enterobacter cloacae complex NOT DETECTED NOT DETECTED Final   Escherichia coli NOT DETECTED NOT DETECTED Final   Klebsiella aerogenes NOT DETECTED NOT DETECTED Final   Klebsiella oxytoca NOT DETECTED NOT DETECTED Final   Klebsiella pneumoniae NOT DETECTED NOT DETECTED Final   Proteus species NOT DETECTED NOT DETECTED Final   Salmonella species NOT DETECTED NOT DETECTED Final   Serratia marcescens NOT DETECTED NOT DETECTED Final   Haemophilus influenzae NOT DETECTED NOT DETECTED Final   Neisseria meningitidis NOT DETECTED NOT DETECTED Final   Pseudomonas aeruginosa NOT DETECTED NOT DETECTED Final   Stenotrophomonas maltophilia NOT DETECTED NOT DETECTED Final   Candida albicans NOT DETECTED NOT DETECTED Final   Candida auris NOT DETECTED NOT DETECTED Final   Candida glabrata NOT DETECTED NOT DETECTED Final   Candida krusei NOT DETECTED NOT DETECTED Final    Candida parapsilosis NOT DETECTED NOT DETECTED Final   Candida tropicalis NOT DETECTED NOT DETECTED Final   Cryptococcus neoformans/gattii NOT DETECTED NOT DETECTED Final   Meth resistant mecA/C and MREJ DETECTED (A) NOT DETECTED Final  Comment: CRITICAL RESULT CALLED TO, READ BACK BY AND VERIFIED WITH: RN DOROTHYANN ISLAND [id number] AT 1351 BY EC Performed at [facility], [address] [place], [place] [address]   MIC (1 Drug)-     Status: None (Preliminary result)   Collection Time: 02/17/24  2:48 PM  Result Value Ref Range Status   Min Inhibitory Conc (1 Drug) PENDING  Incomplete   Source DAPTOMYCIN  MIC MRSA BLOOD CULTURE  Final    Comment: Performed at [facility], [address] [place], [place] [address]  Blood Culture (routine x 2)     Status: None (Preliminary result)   Collection Time: 02/17/24  2:50 PM   Specimen: BLOOD LEFT WRIST  Result Value Ref Range Status   Specimen Description   Final    BLOOD LEFT WRIST BOTTLES DRAWN AEROBIC AND ANAEROBIC   Special Requests Blood Culture adequate volume  Final   Culture   Final    NO GROWTH 3 DAYS Performed at [facility], [address] [place], [place] [address]    Report Status PENDING  Incomplete  C Difficile Quick Screen w PCR reflex     Status: Abnormal   Collection Time: 02/18/24  6:35 PM   Specimen: Stool  Result Value Ref Range Status   C Diff antigen POSITIVE (A) NEGATIVE Final   C Diff toxin NEGATIVE NEGATIVE Final   C Diff interpretation Results are indeterminate. See PCR results.  Final    Comment: Performed at [facility], [address] [place], [place] [address]  C. Diff by PCR, Reflexed     Status: None   Collection Time: 02/18/24  6:35 PM  Result Value Ref Range Status   Toxigenic C. Difficile by PCR NEGATIVE NEGATIVE Final    Comment: Patient is colonized with non toxigenic C. difficile. May not need treatment unless significant symptoms are present.   Hypervirulent Strain PRESUMPTIVE  NEGATIVE PRESUMPTIVE NEGATIVE Final    Comment: Performed at [facility], [address], [place], [place] [address]  Gastrointestinal Panel by PCR , Stool     Status: None   Collection Time: 02/18/24  8:02 PM  Result Value Ref Range Status   Campylobacter species NOT DETECTED NOT DETECTED Final   Plesimonas shigelloides NOT DETECTED NOT DETECTED Final   Salmonella species NOT DETECTED NOT DETECTED Final   Yersinia enterocolitica NOT DETECTED NOT DETECTED Final   Vibrio species NOT DETECTED NOT DETECTED Final   Vibrio cholerae NOT DETECTED NOT DETECTED Final   Enteroaggregative E coli (EAEC) NOT DETECTED NOT DETECTED Final   Enteropathogenic E coli (EPEC) NOT DETECTED NOT DETECTED Final   Enterotoxigenic E coli (ETEC) NOT DETECTED NOT DETECTED Final   Shiga like toxin producing E coli (STEC) NOT DETECTED NOT DETECTED Final   Shigella/Enteroinvasive E coli (EIEC) NOT DETECTED NOT DETECTED Final   Cryptosporidium NOT DETECTED NOT DETECTED Final   Cyclospora cayetanensis NOT DETECTED NOT DETECTED Final   Entamoeba histolytica NOT DETECTED NOT DETECTED Final   Giardia lamblia NOT DETECTED NOT DETECTED Final   Adenovirus F40/41 NOT DETECTED NOT DETECTED Final   Astrovirus NOT DETECTED NOT DETECTED Final   Norovirus GI/GII NOT DETECTED NOT DETECTED Final   Rotavirus A NOT DETECTED NOT DETECTED Final   Sapovirus (I, II, IV, and V) NOT DETECTED NOT DETECTED Final    Comment: Performed at [facility], [address] [place], [place] [address]  Respiratory (~20 pathogens) panel by PCR  Status: None   Collection Time: 02/19/24 12:58 AM   Specimen: Nasopharyngeal Swab; Respiratory  Result Value Ref Range Status   Adenovirus NOT DETECTED NOT DETECTED Final   Coronavirus 229E NOT DETECTED NOT DETECTED Final    Comment: (NOTE) The Coronavirus on the Respiratory Panel, DOES NOT test for the novel  Coronavirus (2019 nCoV)    Coronavirus HKU1 NOT DETECTED NOT DETECTED Final    Coronavirus NL63 NOT DETECTED NOT DETECTED Final   Coronavirus OC43 NOT DETECTED NOT DETECTED Final   Metapneumovirus NOT DETECTED NOT DETECTED Final   Rhinovirus / Enterovirus NOT DETECTED NOT DETECTED Final   Influenza A NOT DETECTED NOT DETECTED Final   Influenza B NOT DETECTED NOT DETECTED Final   Parainfluenza Virus 1 NOT DETECTED NOT DETECTED Final   Parainfluenza Virus 2 NOT DETECTED NOT DETECTED Final   Parainfluenza Virus 3 NOT DETECTED NOT DETECTED Final   Parainfluenza Virus 4 NOT DETECTED NOT DETECTED Final   Respiratory Syncytial Virus NOT DETECTED NOT DETECTED Final   Bordetella pertussis NOT DETECTED NOT DETECTED Final   Bordetella Parapertussis NOT DETECTED NOT DETECTED Final   Chlamydophila pneumoniae NOT DETECTED NOT DETECTED Final   Mycoplasma pneumoniae NOT DETECTED NOT DETECTED Final    Comment: Performed at [facility], [address] [place], [place] [address]  Culture, blood (Routine X 2) w Reflex to ID Panel     Status: None (Preliminary result)   Collection Time: 02/19/24  2:39 AM   Specimen: BLOOD RIGHT HAND  Result Value Ref Range Status   Specimen Description BLOOD RIGHT HAND  Final   Special Requests   Final    BOTTLES DRAWN AEROBIC AND ANAEROBIC Blood Culture adequate volume IMMUNOCOMPROMISED   Culture   Final    NO GROWTH 1 DAY Performed at [facility], [address], [place], [place] [address]    Report Status PENDING  Incomplete  Culture, blood (Routine X 2) w Reflex to ID Panel     Status: None (Preliminary result)   Collection Time: 02/19/24  2:39 AM   Specimen: BLOOD RIGHT ARM  Result Value Ref Range Status   Specimen Description BLOOD RIGHT ARM  Final   Special Requests   Final    Immunocompromised BOTTLES DRAWN AEROBIC AND ANAEROBIC Blood Culture adequate volume   Culture   Final    NO GROWTH 1 DAY Performed at [facility], [address] [place], [place] [address]    Report Status PENDING  Incomplete  Urine  Culture     Status: None (Preliminary result)   Collection Time: 02/19/24  6:32 AM   Specimen: Urine, Random  Result Value Ref Range Status   Specimen Description URINE, RANDOM  Final   Special Requests   Final    URINE, CLEAN CATCH Performed at [facility], [address], [place], [place] [address]    Culture PENDING  Incomplete   Report Status PENDING  Incomplete         Radiology Studies: US  RENAL Result Date: 02/19/2024 SOPHIA: US  Retroperitoneum Complete, Renal. 02/19/2024 08:49:00 PM TECHNIQUE: Real-time ultrasonography of the retroperitoneum renal was performed. COMPARISON: CT abdomen and pelvis 06/03/2023 and US  Renal 04/11/2019. CLINICAL HISTORY: AKI (acute kidney injury). FINDINGS: FINDINGS: RIGHT KIDNEY/URETER: Right kidney measures 11.7 x 5.6 x 5.7 cm. Normal cortical echogenicity. No hydronephrosis. No calculus. No mass. There is a cyst in the inferior pole of the right kidney measuring 7 x 7 x 8 mm. The  right ureteral jet is seen. LEFT KIDNEY/URETER: Left kidney measures 11.3 x 5.0 x 4.4 cm. Normal cortical echogenicity. No hydronephrosis. No calculus. No mass. There is a cyst in the superior pole of the left kidney measuring 2.5 x 2.4 x 2.2 cm. There is a simple cyst in the inferior pole of the left kidney measuring 2.7 x 2.4 x 2.5 cm. The left ureteral jet is not seen. BLADDER: Debris is visualized layering in the bladder. Only the right ureteral jet is seen. OTHER FINDINGS: Incidental finding of ascites in the perisplenic region and superior to the urinary bladder. IMPRESSION: 1. No acute renal abnormality identified. 2. Bilateral renal cysts. 3. Bladder debris. 4. Incidental small-volume ascites. Electronically signed by: Greig Pique MD 02/19/2024 10:25 PM EST RP Workstation: [id number]   ECHOCARDIOGRAM COMPLETE Result Date: 02/19/2024    ECHOCARDIOGRAM REPORT   Patient Name:   [name] Date of Exam: 02/19/2024 Medical Rec #:  [id number]        Height:       74.0 in  Accession #:    [id number]       Weight:       198.9 lb Date of Birth:  [birth year]        BSA:          2.168 m Patient Age:    73 years         BP:           142/68 mmHg Patient Gender: M                HR:           85 bpm. Exam Location:  Inpatient Procedure: 2D Echo, Cardiac Doppler and Color Doppler (Both Spectral and Color            Flow Doppler were utilized during procedure). Indications:    Bacteremia  History:        Patient has prior history of Echocardiogram examinations, most                 recent 05/04/2023. Stroke; Risk Factors:Hypertension, Diabetes                 and Dyslipidemia.  Sonographer:    Meagan Baucom RDCS, FE, PE Referring Phys: [id number] CLANFORD L JOHNSON IMPRESSIONS  1. Left ventricular ejection fraction, by estimation, is 60 to 65%. The left ventricle has normal function. The left ventricle has no regional wall motion abnormalities. Left ventricular diastolic parameters are consistent with Grade I diastolic dysfunction (impaired relaxation).  2. Right ventricular systolic function is normal. The right ventricular size is normal.  3. The mitral valve is normal in structure. No evidence of mitral valve regurgitation. No evidence of mitral stenosis.  4. The aortic valve is tricuspid. There is mild calcification of the aortic valve. Aortic valve regurgitation is not visualized. No aortic stenosis is present.  5. The inferior vena cava is normal in size with greater than 50% respiratory variability, suggesting right atrial pressure of 3 mmHg. Conclusion(s)/Recommendation(s): No evidence of valvular vegetations on this transthoracic echocardiogram. Consider a transesophageal echocardiogram to exclude infective endocarditis if clinically indicated. FINDINGS  Left Ventricle: Left ventricular ejection fraction, by estimation, is 60 to 65%. The left ventricle has normal function. The left ventricle has no regional wall motion abnormalities. The left ventricular internal cavity size was normal in  size. There is  no left ventricular hypertrophy. Left ventricular diastolic parameters are consistent with Grade I diastolic dysfunction (impaired relaxation).  Right Ventricle: The right ventricular size is normal. No increase in right ventricular wall thickness. Right ventricular systolic function is normal. Left Atrium: Left atrial size was normal in size. Right Atrium: Right atrial size was normal in size. Pericardium: There is no evidence of pericardial effusion. Mitral Valve: The mitral valve is normal in structure. No evidence of mitral valve regurgitation. No evidence of mitral valve stenosis. Tricuspid Valve: The tricuspid valve is normal in structure. Tricuspid valve regurgitation is not demonstrated. No evidence of tricuspid stenosis. Aortic Valve: The aortic valve is tricuspid. There is mild calcification of the aortic valve. Aortic valve regurgitation is not visualized. No aortic stenosis is present. Pulmonic Valve: The pulmonic valve was normal in structure. Pulmonic valve regurgitation is not visualized. No evidence of pulmonic stenosis. Aorta: The aortic root is normal in size and structure. Venous: The inferior vena cava is normal in size with greater than 50% respiratory variability, suggesting right atrial pressure of 3 mmHg. IAS/Shunts: No atrial level shunt detected by color flow Doppler.  LEFT VENTRICLE PLAX 2D LVIDd:         4.70 cm   Diastology LVIDs:         3.10 cm   LV e' medial:    7.83 cm/s LV PW:         1.00 cm   LV E/e' medial:  8.6 LV IVS:        1.10 cm   LV e' lateral:   8.16 cm/s LVOT diam:     2.40 cm   LV E/e' lateral: 8.2 LV SV:         79 LV SV Index:   37 LVOT Area:     4.52 cm  RIGHT VENTRICLE RV Basal diam:  3.10 cm RV Mid diam:    2.40 cm RV S prime:     12.00 cm/s TAPSE (M-mode): 2.2 cm LEFT ATRIUM             Index        RIGHT ATRIUM           Index LA diam:        3.40 cm 1.57 cm/m   RA Area:     13.10 cm LA Vol (A2C):   51.0 ml 23.52 ml/m  RA Volume:   28.50 ml   13.14 ml/m LA Vol (A4C):   45.8 ml 21.12 ml/m LA Biplane Vol: 52.6 ml 24.26 ml/m  AORTIC VALVE LVOT Vmax:   107.00 cm/s LVOT Vmean:  69.500 cm/s LVOT VTI:    0.175 m  AORTA Ao Root diam: 3.40 cm Ao Asc diam:  3.40 cm MITRAL VALVE               TRICUSPID VALVE MV Area (PHT): 4.39 cm    TR Peak grad:   14.1 mmHg MV Decel Time: 173 msec    TR Vmax:        188.00 cm/s MV E velocity: 67.30 cm/s MV A velocity: 95.20 cm/s  SHUNTS MV E/A ratio:  0.71        Systemic VTI:  0.18 m                            Systemic Diam: 2.40 cm Oneil Parchment MD Electronically signed by Oneil Parchment MD Signature Date/Time: 02/19/2024/3:08:31 PM    Final            LOS: 3 days   Time spent= 35  mins    Burgess JAYSON Dare, MD [facility]  If 7PM-7AM, please contact night-coverage  02/20/2024, 10:25 AM  "

## 2024-02-20 NOTE — Plan of Care (Signed)
" °  Problem: Education: Goal: Knowledge of General Education information will improve Description: Including pain rating scale, medication(s)/side effects and non-pharmacologic comfort measures Outcome: Not Progressing   Problem: Clinical Measurements: Goal: Ability to maintain clinical measurements within normal limits will improve Outcome: Not Progressing Goal: Will remain free from infection Outcome: Not Progressing Goal: Diagnostic test results will improve Outcome: Not Progressing Goal: Respiratory complications will improve Outcome: Not Progressing Goal: Cardiovascular complication will be avoided Outcome: Not Progressing   Problem: Health Behavior/Discharge Planning: Goal: Ability to manage health-related needs will improve Outcome: Not Progressing   Problem: Activity: Goal: Risk for activity intolerance will decrease Outcome: Not Progressing   Problem: Nutrition: Goal: Adequate nutrition will be maintained Outcome: Not Progressing   Problem: Coping: Goal: Level of anxiety will decrease Outcome: Not Progressing   Problem: Elimination: Goal: Will not experience complications related to bowel motility Outcome: Not Progressing Goal: Will not experience complications related to urinary retention Outcome: Not Progressing   Problem: Pain Managment: Goal: General experience of comfort will improve and/or be controlled Outcome: Not Progressing   Problem: Safety: Goal: Ability to remain free from injury will improve Outcome: Not Progressing   Problem: Skin Integrity: Goal: Risk for impaired skin integrity will decrease Outcome: Not Progressing   "

## 2024-02-21 ENCOUNTER — Inpatient Hospital Stay (HOSPITAL_COMMUNITY): Admitting: Anesthesiology

## 2024-02-21 ENCOUNTER — Encounter (HOSPITAL_COMMUNITY): Admitting: Anesthesiology

## 2024-02-21 ENCOUNTER — Encounter (HOSPITAL_COMMUNITY)
Admission: EM | Disposition: A | Discharge status: Skilled Nursing Facility | Payer: Self-pay | Source: Home / Self Care | Attending: Internal Medicine

## 2024-02-21 ENCOUNTER — Encounter (HOSPITAL_COMMUNITY): Payer: Self-pay | Admitting: Internal Medicine

## 2024-02-21 DIAGNOSIS — N183 Chronic kidney disease, stage 3 unspecified: Secondary | ICD-10-CM

## 2024-02-21 DIAGNOSIS — L089 Local infection of the skin and subcutaneous tissue, unspecified: Secondary | ICD-10-CM | POA: Diagnosis not present

## 2024-02-21 DIAGNOSIS — L02611 Cutaneous abscess of right foot: Secondary | ICD-10-CM | POA: Diagnosis not present

## 2024-02-21 DIAGNOSIS — Z89431 Acquired absence of right foot: Secondary | ICD-10-CM

## 2024-02-21 DIAGNOSIS — M86271 Subacute osteomyelitis, right ankle and foot: Secondary | ICD-10-CM | POA: Diagnosis not present

## 2024-02-21 DIAGNOSIS — M869 Osteomyelitis, unspecified: Secondary | ICD-10-CM

## 2024-02-21 DIAGNOSIS — E1169 Type 2 diabetes mellitus with other specified complication: Secondary | ICD-10-CM

## 2024-02-21 DIAGNOSIS — E11628 Type 2 diabetes mellitus with other skin complications: Secondary | ICD-10-CM | POA: Diagnosis not present

## 2024-02-21 DIAGNOSIS — I129 Hypertensive chronic kidney disease with stage 1 through stage 4 chronic kidney disease, or unspecified chronic kidney disease: Secondary | ICD-10-CM

## 2024-02-21 HISTORY — PX: AMPUTATION: SHX166

## 2024-02-21 LAB — GLUCOSE, CAPILLARY
Glucose-Capillary: 101 mg/dL — ABNORMAL HIGH (ref 70–99)
Glucose-Capillary: 105 mg/dL — ABNORMAL HIGH (ref 70–99)
Glucose-Capillary: 108 mg/dL — ABNORMAL HIGH (ref 70–99)
Glucose-Capillary: 110 mg/dL — ABNORMAL HIGH (ref 70–99)
Glucose-Capillary: 112 mg/dL — ABNORMAL HIGH (ref 70–99)
Glucose-Capillary: 113 mg/dL — ABNORMAL HIGH (ref 70–99)
Glucose-Capillary: 114 mg/dL — ABNORMAL HIGH (ref 70–99)
Glucose-Capillary: 146 mg/dL — ABNORMAL HIGH (ref 70–99)
Glucose-Capillary: 167 mg/dL — ABNORMAL HIGH (ref 70–99)

## 2024-02-21 LAB — BASIC METABOLIC PANEL WITH GFR
Anion gap: 12 (ref 5–15)
BUN: 49 mg/dL — ABNORMAL HIGH (ref 8–23)
CO2: 19 mmol/L — ABNORMAL LOW (ref 22–32)
Calcium: 8 mg/dL — ABNORMAL LOW (ref 8.9–10.3)
Chloride: 106 mmol/L (ref 98–111)
Creatinine, Ser: 3.49 mg/dL — ABNORMAL HIGH (ref 0.61–1.24)
GFR, Estimated: 18 mL/min — ABNORMAL LOW
Glucose, Bld: 115 mg/dL — ABNORMAL HIGH (ref 70–99)
Potassium: 4.1 mmol/L (ref 3.5–5.1)
Sodium: 137 mmol/L (ref 135–145)

## 2024-02-21 LAB — MAGNESIUM: Magnesium: 1.7 mg/dL (ref 1.7–2.4)

## 2024-02-21 MED ORDER — SODIUM CHLORIDE 0.9 % IV SOLN: INTRAVENOUS | Status: AC

## 2024-02-21 MED ORDER — POTASSIUM CHLORIDE CRYS ER 20 MEQ PO TBCR: 40.0000 meq | EXTENDED_RELEASE_TABLET | Freq: Every day | ORAL | Status: DC | PRN

## 2024-02-21 MED ORDER — FENTANYL CITRATE (PF) 100 MCG/2ML IJ SOLN: 25.0000 ug | INTRAMUSCULAR | Status: DC | PRN

## 2024-02-21 MED ORDER — VITAMIN D (ERGOCALCIFEROL) 1.25 MG (50000 UNIT) PO CAPS
50000.0000 [IU] | ORAL_CAPSULE | Freq: Every day | ORAL | Status: DC
  Administered 2024-02-21 – 2024-02-27 (×7): 50000 [IU] via ORAL
  Filled 2024-02-21 (×8): qty 1

## 2024-02-21 MED ORDER — CEFAZOLIN SODIUM-DEXTROSE 2-4 GM/100ML-% IV SOLN
INTRAVENOUS | Status: AC
  Filled 2024-02-21: qty 100

## 2024-02-21 MED ORDER — PHENOL 1.4 % MT LIQD: 1.0000 | OROMUCOSAL | Status: DC | PRN

## 2024-02-21 MED ORDER — VASHE WOUND IRRIGATION OPTIME
TOPICAL | Status: DC | PRN
  Administered 2024-02-21: 34 [oz_av]

## 2024-02-21 MED ORDER — PROPOFOL 10 MG/ML IV BOLUS
INTRAVENOUS | Status: DC | PRN
  Administered 2024-02-21: 30 mg via INTRAVENOUS

## 2024-02-21 MED ORDER — ONDANSETRON HCL 4 MG/2ML IJ SOLN
4.0000 mg | Freq: Four times a day (QID) | INTRAMUSCULAR | Status: DC | PRN
  Administered 2024-02-21 – 2024-02-24 (×5): 4 mg via INTRAVENOUS
  Filled 2024-02-21 (×5): qty 2

## 2024-02-21 MED ORDER — ACETAMINOPHEN 325 MG PO TABS
325.0000 mg | ORAL_TABLET | Freq: Four times a day (QID) | ORAL | Status: DC | PRN
  Administered 2024-02-23 – 2024-02-25 (×3): 650 mg via ORAL
  Filled 2024-02-21 (×3): qty 2

## 2024-02-21 MED ORDER — SODIUM CHLORIDE 0.9% FLUSH
3.0000 mL | Freq: Two times a day (BID) | INTRAVENOUS | Status: DC
  Administered 2024-02-21 – 2024-02-28 (×13): 3 mL via INTRAVENOUS

## 2024-02-21 MED ORDER — AMISULPRIDE (ANTIEMETIC) 5 MG/2ML IV SOLN: 10.0000 mg | Freq: Once | INTRAVENOUS | Status: DC | PRN

## 2024-02-21 MED ORDER — ACETAMINOPHEN 500 MG PO TABS
1000.0000 mg | ORAL_TABLET | Freq: Four times a day (QID) | ORAL | Status: AC
  Administered 2024-02-21 – 2024-02-22 (×3): 1000 mg via ORAL
  Filled 2024-02-21 (×3): qty 2

## 2024-02-21 MED ORDER — ACETAMINOPHEN 500 MG PO TABS
ORAL_TABLET | ORAL | Status: AC
  Administered 2024-02-21: 1000 mg via ORAL
  Filled 2024-02-21: qty 2

## 2024-02-21 MED ORDER — OXYCODONE HCL 5 MG PO TABS: 5.0000 mg | ORAL_TABLET | Freq: Once | ORAL | Status: DC | PRN

## 2024-02-21 MED ORDER — FERROUS SULFATE 325 (65 FE) MG PO TABS
325.0000 mg | ORAL_TABLET | Freq: Three times a day (TID) | ORAL | Status: DC
  Administered 2024-02-21 – 2024-02-28 (×19): 325 mg via ORAL
  Filled 2024-02-21 (×19): qty 1

## 2024-02-21 MED ORDER — ZINC SULFATE 220 (50 ZN) MG PO CAPS
220.0000 mg | ORAL_CAPSULE | Freq: Every day | ORAL | Status: DC
  Administered 2024-02-21 – 2024-02-28 (×7): 220 mg via ORAL
  Filled 2024-02-21 (×7): qty 1

## 2024-02-21 MED ORDER — ACETAMINOPHEN 500 MG PO TABS: 1000.0000 mg | ORAL_TABLET | Freq: Once | ORAL | Status: AC

## 2024-02-21 MED ORDER — VITAMIN C 500 MG PO TABS
1000.0000 mg | ORAL_TABLET | Freq: Every day | ORAL | Status: DC
  Administered 2024-02-21 – 2024-02-28 (×7): 1000 mg via ORAL
  Filled 2024-02-21 (×7): qty 2

## 2024-02-21 MED ORDER — SODIUM CHLORIDE 0.9 % IV SOLN: INTRAVENOUS | Status: DC

## 2024-02-21 MED ORDER — HYDROMORPHONE HCL 1 MG/ML IJ SOLN
0.5000 mg | INTRAMUSCULAR | Status: DC | PRN
  Administered 2024-02-22 – 2024-02-24 (×4): 1 mg via INTRAVENOUS
  Filled 2024-02-21 (×5): qty 1

## 2024-02-21 MED ORDER — INSULIN ASPART 100 UNIT/ML IJ SOLN: 0.0000 [IU] | INTRAMUSCULAR | Status: DC | PRN

## 2024-02-21 MED ORDER — OXYCODONE HCL 5 MG PO TABS
10.0000 mg | ORAL_TABLET | ORAL | Status: DC | PRN
  Administered 2024-02-23 – 2024-02-25 (×4): 15 mg via ORAL
  Filled 2024-02-21 (×4): qty 3

## 2024-02-21 MED ORDER — PROPOFOL 500 MG/50ML IV EMUL
INTRAVENOUS | Status: DC | PRN
  Administered 2024-02-21: 100 ug/kg/min via INTRAVENOUS

## 2024-02-21 MED ORDER — FENTANYL CITRATE (PF) 100 MCG/2ML IJ SOLN: 50.0000 ug | Freq: Once | INTRAMUSCULAR | Status: DC

## 2024-02-21 MED ORDER — DAPTOMYCIN-SODIUM CHLORIDE 700-0.9 MG/100ML-% IV SOLN
8.0000 mg/kg | INTRAVENOUS | Status: AC
  Administered 2024-02-21 – 2024-02-25 (×3): 700 mg via INTRAVENOUS
  Filled 2024-02-21 (×3): qty 100

## 2024-02-21 MED ORDER — FENTANYL CITRATE (PF) 100 MCG/2ML IJ SOLN
INTRAMUSCULAR | Status: AC
  Administered 2024-02-21: 50 ug
  Filled 2024-02-21: qty 2

## 2024-02-21 MED ORDER — STERILE WATER FOR IRRIGATION IR SOLN
Status: DC | PRN
  Administered 2024-02-21: 1000 mL

## 2024-02-21 MED ORDER — SODIUM CHLORIDE 0.9 % IV SOLN: 250.0000 mL | INTRAVENOUS | Status: AC | PRN

## 2024-02-21 MED ORDER — OXYCODONE HCL 5 MG PO TABS
5.0000 mg | ORAL_TABLET | ORAL | Status: DC | PRN
  Administered 2024-02-22 – 2024-02-25 (×2): 10 mg via ORAL
  Filled 2024-02-21 (×2): qty 2

## 2024-02-21 MED ORDER — ORAL CARE MOUTH RINSE: 15.0000 mL | Freq: Once | OROMUCOSAL | Status: AC

## 2024-02-21 MED ORDER — OXYCODONE HCL 5 MG/5ML PO SOLN: 5.0000 mg | Freq: Once | ORAL | Status: DC | PRN

## 2024-02-21 MED ORDER — ROPIVACAINE HCL 5 MG/ML IJ SOLN
INTRAMUSCULAR | Status: DC | PRN
  Administered 2024-02-21: 30 mL via PERINEURAL
  Administered 2024-02-21: 15 mL via PERINEURAL

## 2024-02-21 MED ORDER — HEPARIN SODIUM (PORCINE) 5000 UNIT/ML IJ SOLN
5000.0000 [IU] | Freq: Three times a day (TID) | INTRAMUSCULAR | Status: DC
  Administered 2024-02-22 – 2024-02-28 (×17): 5000 [IU] via SUBCUTANEOUS
  Filled 2024-02-21 (×18): qty 1

## 2024-02-21 MED ORDER — RISAQUAD PO CAPS
1.0000 | ORAL_CAPSULE | Freq: Every day | ORAL | Status: DC
  Administered 2024-02-21 – 2024-02-28 (×8): 1 via ORAL
  Filled 2024-02-21 (×8): qty 1

## 2024-02-21 MED ORDER — SODIUM CHLORIDE 0.9% FLUSH: 3.0000 mL | INTRAVENOUS | Status: DC | PRN

## 2024-02-21 MED ORDER — CHLORHEXIDINE GLUCONATE 0.12 % MT SOLN
OROMUCOSAL | Status: AC
  Administered 2024-02-21: 15 mL via OROMUCOSAL
  Filled 2024-02-21: qty 15

## 2024-02-21 MED ORDER — METOPROLOL SUCCINATE ER 25 MG PO TB24
ORAL_TABLET | ORAL | Status: AC
  Filled 2024-02-21: qty 2

## 2024-02-21 MED ORDER — PHENYLEPHRINE 80 MCG/ML (10ML) SYRINGE FOR IV PUSH (FOR BLOOD PRESSURE SUPPORT)
PREFILLED_SYRINGE | INTRAVENOUS | Status: DC | PRN
  Administered 2024-02-21: 80 ug via INTRAVENOUS

## 2024-02-21 MED ORDER — CEFAZOLIN SODIUM-DEXTROSE 2-3 GM-%(50ML) IV SOLR
INTRAVENOUS | Status: DC | PRN
  Administered 2024-02-21: 2 g via INTRAVENOUS

## 2024-02-21 MED ORDER — CHLORHEXIDINE GLUCONATE 0.12 % MT SOLN: 15.0000 mL | Freq: Once | OROMUCOSAL | Status: AC

## 2024-02-21 MED ORDER — HYDRALAZINE HCL 20 MG/ML IJ SOLN: 5.0000 mg | INTRAMUSCULAR | Status: DC | PRN

## 2024-02-21 MED ORDER — JUVEN PO PACK
1.0000 | PACK | Freq: Two times a day (BID) | ORAL | Status: DC
  Administered 2024-02-23 – 2024-02-28 (×10): 1 via ORAL
  Filled 2024-02-21 (×10): qty 1

## 2024-02-21 MED ORDER — DOCUSATE SODIUM 100 MG PO CAPS
100.0000 mg | ORAL_CAPSULE | Freq: Every day | ORAL | Status: DC
  Administered 2024-02-23 – 2024-02-28 (×6): 100 mg via ORAL
  Filled 2024-02-21 (×6): qty 1

## 2024-02-21 NOTE — Progress Notes (Signed)
 Orthopedic Tech Progress Note Patient Details:  [name] July 28, 1950 [id number]  Patient has VIVE PROTOCOL   Patient ID: [name], male   DOB: Jul 07, 1950, 74 y.o.   MRN: [id number]  [name] Pac 02/21/2024, 3:17 PM

## 2024-02-21 NOTE — Anesthesia Procedure Notes (Signed)
 Anesthesia Regional Block: Adductor canal block   Pre-Anesthetic Checklist: , timeout performed,  Correct Patient, Correct Site, Correct Laterality,  Correct Procedure, Correct Position, site marked,  Risks and benefits discussed,  Surgical consent,  Pre-op evaluation,  At surgeon's request and post-op pain management  Laterality: Right  Prep: chloraprep       Needles:  Injection technique: Single-shot  Needle Type: Echogenic Stimulator Needle     Needle Length: 9cm  Needle Gauge: 21     Additional Needles:   Procedures:,,,, ultrasound used (permanent image in chart),,    Narrative:  Start time: 02/21/2024 1:09 PM End time: 02/21/2024 1:12 PM Injection made incrementally with aspirations every 5 mL.  Performed by: Personally  Anesthesiologist: Peggye Delon Brunswick, MD  Additional Notes: Discussed risks and benefits of nerve block including, but not limited to, prolonged and/or permanent nerve injury involving sensory and/or motor function. Monitors were applied and a time-out was performed. The nerve and associated structures were visualized under ultrasound guidance. After negative aspiration, local anesthetic was slowly injected around the nerve. There was no evidence of high pressure during the procedure. There were no paresthesias. VSS remained stable and the patient tolerated the procedure well.

## 2024-02-21 NOTE — Op Note (Signed)
 02/21/2024  3:22 PM  PATIENT:  [name]    PRE-OPERATIVE DIAGNOSIS:  Osteomyelitis Right Foot  POST-OPERATIVE DIAGNOSIS:  Same  PROCEDURE:  AMPUTATION BELOW KNEE Application of Kerecis micro graft 38 cm. Application of Prevena Peel and Place wound VAC dressings Application of Vive Wear stump shrinker and the [employer] limb protector  SURGEON:  Jerona LULLA Sage, MD  ANESTHESIA:   General  PREOPERATIVE INDICATIONS:  [name] is a  74 y.o. male with a diagnosis of Osteomyelitis Right Foot who failed conservative measures and elected for surgical management.    The risks benefits and alternatives were discussed with the patient preoperatively including but not limited to the risks of infection, bleeding, nerve injury, cardiopulmonary complications, the need for revision surgery, among others, and the patient was willing to proceed.  OPERATIVE IMPLANTS:   Implant Name Type Inv. Item Serial No. Manufacturer Lot No. LRB No. Used Action  GRAFT SKIN WND MICRO 38 - [id number] Tissue GRAFT SKIN WND MICRO 38  [employer] [id number] Right 1 Implanted     OPERATIVE FINDINGS: muscle with poor color and poor contractility  OPERATIVE PROCEDURE: Patient was brought to the operating room after undergoing a regional anesthetic.  After adequate levels anesthesia were obtained a thigh tourniquet was placed and the lower extremity was prepped using DuraPrep draped into a sterile field. The foot was draped out of the sterile field with impervious stockinette.  A timeout was called and the tourniquet inflated.  A transverse skin incision was made 12 cm distal to the tibial tubercle, the incision curved proximally, and a large posterior flap was created.  The tibia was transected just proximal to the skin incision and beveled anteriorly.  The fibula was transected just proximal to the tibial incision.  The sciatic nerve was pulled cut and allowed to retract.  The vascular bundles were suture ligated  with 2-0 silk.  The tourniquet was deflated and hemostasis obtained.  The wound was irrigated with Vashe.   The Kerecis micro powder 38 cm was applied to the open wound that has a 200 cm surface area.    The deep and superficial fascial layers were closed using #1 Vicryl.  The skin was closed using staples.    The Prevena Peel and Place dressing was applied this was overwrapped with Ioban.  This was connected to the wound VAC pump and had a good suction fit, this was covered with a stump shrinker and a limb protector.  Patient was taken to the PACU in stable condition.   DISCHARGE PLANNING:  Antibiotic duration: 24-hour antibiotics  Weightbearing: Nonweightbearing on the operative extremity  Pain medication: Opioid pathway  Dressing care/ Wound VAC: Continue wound VAC with the Prevena plus pump at discharge for 1 week  Ambulatory devices: Walker or kneeling scooter  Discharge to: Discharge planning based on recommendations per physical therapy  Follow-up: In the office 1 week after discharge.

## 2024-02-21 NOTE — Plan of Care (Signed)
 Id brief note  Patient awaiting bka today and tee tomorrow  Final abx recs pending tee  -continue current antibiotics dapto/amp sulb for now -the amp-sulbactam can be d/c'ed after bka today

## 2024-02-21 NOTE — Progress Notes (Incomplete)
 F/u on AKA, tentatively plan to stop unasyn  post-surgery

## 2024-02-21 NOTE — Plan of Care (Signed)
  Problem: Activity: Goal: Risk for activity intolerance will decrease Outcome: Progressing   Problem: Nutrition: Goal: Adequate nutrition will be maintained Outcome: Progressing   Problem: Elimination: Goal: Will not experience complications related to bowel motility Outcome: Progressing   Problem: Pain Managment: Goal: General experience of comfort will improve and/or be controlled Outcome: Progressing

## 2024-02-21 NOTE — Anesthesia Procedure Notes (Signed)
 Anesthesia Regional Block: Popliteal block   Pre-Anesthetic Checklist: , timeout performed,  Correct Patient, Correct Site, Correct Laterality,  Correct Procedure, Correct Position, site marked,  Risks and benefits discussed,  Surgical consent,  Pre-op evaluation,  At surgeon's request and post-op pain management  Laterality: Right  Prep: chloraprep       Needles:  Injection technique: Single-shot  Needle Type: Echogenic Stimulator Needle     Needle Length: 9cm  Needle Gauge: 21     Additional Needles:   Procedures:,,,, ultrasound used (permanent image in chart),,    Narrative:  Start time: 02/21/2024 1:05 PM End time: 02/21/2024 1:09 PM Injection made incrementally with aspirations every 5 mL.  Performed by: Personally  Anesthesiologist: Peggye Delon Brunswick, MD  Additional Notes: Discussed risks and benefits of nerve block including, but not limited to, prolonged and/or permanent nerve injury involving sensory and/or motor function. Monitors were applied and a time-out was performed. The nerve and associated structures were visualized under ultrasound guidance. After negative aspiration, local anesthetic was slowly injected around the nerve. There was no evidence of high pressure during the procedure. There were no paresthesias. VSS remained stable and the patient tolerated the procedure well.

## 2024-02-21 NOTE — Interval H&P Note (Signed)
 History and Physical Interval Note:  02/21/2024 6:47 AM  [name]  has presented today for surgery, with the diagnosis of Osteomyelitis Right Foot.  The various methods of treatment have been discussed with the patient and family. After consideration of risks, benefits and other options for treatment, the patient has consented to  Procedures: AMPUTATION BELOW KNEE (Right) as a surgical intervention.  The patient's history has been reviewed, patient examined, no change in status, stable for surgery.  I have reviewed the patient's chart and labs.  Questions were answered to the patient's satisfaction.     Isaiha Asare V Athira Janowicz

## 2024-02-21 NOTE — Transfer of Care (Signed)
 Immediate Anesthesia Transfer of Care Note  Patient: [name]  Procedure(s) Performed: AMPUTATION BELOW KNEE (Right: Knee)  Patient Location: PACU  Anesthesia Type:MAC and Regional  Level of Consciousness: awake  Airway & Oxygen Therapy: Patient Spontanous Breathing  Post-op Assessment: Report given to RN and Post -op Vital signs reviewed and stable  Post vital signs: Reviewed and stable  Last Vitals:  Vitals Value Taken Time  BP 144/75 02/21/24 15:27  Temp 37.1 C 02/21/24 15:27  Pulse 88 02/21/24 15:28  Resp 19 02/21/24 15:28  SpO2 99 % 02/21/24 15:28  Vitals shown include unfiled device data.  Last Pain:  Vitals:   02/21/24 1220  TempSrc:   PainSc: 0-No pain      Patients Stated Pain Goal: 0 (02/20/24 2043)  Complications: No notable events documented.

## 2024-02-21 NOTE — Anesthesia Postprocedure Evaluation (Signed)
"   Anesthesia Post Note  Patient: [name]  Procedure(s) Performed: AMPUTATION BELOW KNEE (Right: Knee)     Patient location during evaluation: PACU Anesthesia Type: Regional Level of consciousness: awake and alert Pain management: pain level controlled Vital Signs Assessment: post-procedure vital signs reviewed and stable Respiratory status: spontaneous breathing, nonlabored ventilation, respiratory function stable and patient connected to nasal cannula oxygen Cardiovascular status: stable and blood pressure returned to baseline Postop Assessment: no apparent nausea or vomiting Anesthetic complications: no   No notable events documented.  Last Vitals:  Vitals:   02/21/24 1600 02/21/24 1620  BP: (!) 149/67 (!) 149/75  Pulse: 86 90  Resp: (!) 23 20  Temp: 37.3 C 36.9 C  SpO2: 100% 100%    Last Pain:  Vitals:   02/21/24 1653  TempSrc:   PainSc: 2    Pain Goal: Patients Stated Pain Goal: 0 (02/20/24 2043)                 Rome Ade      "

## 2024-02-21 NOTE — Anesthesia Preprocedure Evaluation (Addendum)
 "                                  Anesthesia Evaluation  Patient identified by MRN, date of birth, ID band Patient awake    Reviewed: Allergy  & Precautions, NPO status , Patient's Chart, lab work & pertinent test results  History of Anesthesia Complications Negative for: history of anesthetic complications  Airway Mallampati: III  TM Distance: >3 FB Neck ROM: Full    Dental  (+) Edentulous Upper, Edentulous Lower   Pulmonary neg pulmonary ROS   Pulmonary exam normal breath sounds clear to auscultation       Cardiovascular hypertension (hydralazine , metoprolol ), Pt. on medications and Pt. on home beta blockers (-) angina (-) Past MI, (-) Cardiac Stents and (-) CABG (-) dysrhythmias  Rhythm:Regular Rate:Normal  HLD  TTE 02/19/2024: IMPRESSIONS    1. Left ventricular ejection fraction, by estimation, is 60 to 65%. The  left ventricle has normal function. The left ventricle has no regional  wall motion abnormalities. Left ventricular diastolic parameters are  consistent with Grade I diastolic  dysfunction (impaired relaxation).   2. Right ventricular systolic function is normal. The right ventricular  size is normal.   3. The mitral valve is normal in structure. No evidence of mitral valve  regurgitation. No evidence of mitral stenosis.   4. The aortic valve is tricuspid. There is mild calcification of the  aortic valve. Aortic valve regurgitation is not visualized. No aortic  stenosis is present.   5. The inferior vena cava is normal in size with greater than 50%  respiratory variability, suggesting right atrial pressure of 3 mmHg.      Neuro/Psych neg Seizures PSYCHIATRIC DISORDERS Anxiety Depression    CVA (slurred speech), Residual Symptoms    GI/Hepatic ,GERD  Medicated,,Fatty liver C. diff   Endo/Other  diabetes (Hgb A1c 9.8), Poorly Controlled, Type 2, Oral Hypoglycemic Agents, Insulin  Dependent    Renal/GU CRFRenal disease (stage 3)      Musculoskeletal  (+) Arthritis ,  Osteomyelitis    Abdominal   Peds  Hematology  (+) Blood dyscrasia, anemia Lab Results      Component                Value               Date                      WBC                      2.6 (L)             02/20/2024                HGB                      8.7 (L)             02/20/2024                HCT                      26.0 (L)            02/20/2024                MCV  80.5                02/20/2024                PLT                      255                 02/20/2024              Anesthesia Other Findings Sepsis secondary to right diabetic foot infection associated with abscess and osteomyelitis Gram-positive bacteremia   Reproductive/Obstetrics                              Anesthesia Physical Anesthesia Plan  ASA: 3  Anesthesia Plan: MAC and Regional   Post-op Pain Management: Tylenol  PO (pre-op)* and Regional block*   Induction: Intravenous  PONV Risk Score and Plan: 2 and Ondansetron , Dexamethasone  and Propofol  infusion  Airway Management Planned: Natural Airway and Simple Face Mask  Additional Equipment:   Intra-op Plan:   Post-operative Plan: Extubation in OR  Informed Consent: I have reviewed the patients History and Physical, chart, labs and discussed the procedure including the risks, benefits and alternatives for the proposed anesthesia with the patient or authorized representative who has indicated his/her understanding and acceptance.     Dental advisory given  Plan Discussed with: CRNA and Anesthesiologist  Anesthesia Plan Comments: (Discussed potential risks of nerve blocks including, but not limited to, infection, bleeding, nerve damage, seizures, pneumothorax, respiratory depression, and potential failure of the block. Alternatives to nerve blocks discussed. All questions answered.  Discussed with patient risks of MAC including, but not limited to, minor pain  or discomfort, hearing people in the room, and possible need for backup general anesthesia. Risks for general anesthesia also discussed including, but not limited to, sore throat, hoarse voice, chipped/damaged teeth, injury to vocal cords, nausea and vomiting, allergic reactions, lung infection, heart attack, stroke, and death. All questions answered. )         Anesthesia Quick Evaluation  "

## 2024-02-21 NOTE — Progress Notes (Signed)
 " PROGRESS NOTE    [name]  FMW:[id number] DOB: 1951-01-19 DOA: 02/17/2024 PCP: Marvine Rush, MD    Brief Narrative:   74 year old with history of CVA with residual slurred speech, CKD 3 AA, chronic osteomyelitis s/p right TMA, HTN, DM 2 admitted to the hospital for generally feeling sick and having nausea/vomiting.  Workup showed concerns of sepsis with worsening right diabetic foot infection with abscess.  Dr. Harden was consulted and patient was transferred from [facility] to [facility] for surgical intervention  Assessment & Plan:   Sepsis secondary to right diabetic foot infection associated with abscess and osteomyelitis Gram-positive bacteremia -Orthopedic consulted.  For now continue IV antibiotics.  Dr. Harden is planning on right sided BKA for better source control today - Abx per ID -Repeat blood cultures 1/5 - NGTD -Echocardiogram showing preserved EF 65%, grade 1 DD.  No evidence of vegetation -TEE planned 1/8   Anemia - Most likely chronic.  Initial labs likely hemoconcentrated due to dehydration.  Admission hemoglobin 12 now drifted down to 8.7.  No signs of obvious bleeding     Type 2 diabetes mellitus with neurological complications, uncontrolled with hyperglycemia -A1c 9.8.  Sliding scale and Accu-Chek.  Will adjust as necessary   AKI on CKD stage 3b - Baseline creatinine around 2.0, admission creatinine 4.82 with IV fluids renal function is slowly improving, creatinine 3.49. - Renal ultrasound does not show acute pathology   Gastroenteritis -Improved.  C. difficile antigen positive per toxigenic PCR is negative.  GI panel is neg.    Diffuse macular rash  -Had mild symptoms, unclear.  Improving   Essential hypertension  Sinus tachycardia -Toprol -XL IV as needed  Hypomagnesemia/hypokalemia - As needed repletion    DVT prophylaxis: heparin  Code Status: Full  Family Communication:  Disposition: Multiple issues on going. Amputation planned  1/7 and TEE on 1/8   PT Follow up Recs:   Subjective:  Doing ok. Tells he is update because I told him risk for the procedure is that he may die. I explained him, he is likely confusing me for someone else as I never talked to him about procedural consent.  No other complaints otherwise.   Examination:  General exam: Appears calm and comfortable  Respiratory system: Clear to auscultation. Respiratory effort normal. Cardiovascular system: S1 & S2 heard, RRR. No JVD, murmurs, rubs, gallops or clicks. No pedal edema. Gastrointestinal system: Abdomen is nondistended, soft and nontender. No organomegaly or masses felt. Normal bowel sounds heard. Central nervous system: Alert and oriented. No focal neurological deficits.  Chronic slurred speech Extremities: Symmetric 5 x 5 power.  Lower extremity dressing noted Skin: Diffuse maculopapular rash appears to be better Psychiatry: Judgement and insight appear normal. Mood & affect appropriate.                Diet Orders (From admission, onward)     Start     Ordered   02/22/24 0001  Diet NPO time specified Except for: Sips with Meds  Diet effective now       Comments: Patient to remain NPO until fully awake following the TEE.  Question:  Except for  Answer:  Sips with Meds   02/20/24 1658   02/21/24 0430  Diet NPO time specified  Diet effective ____        02/20/24 0810            Objective: Vitals:   02/20/24 1544 02/20/24 2012 02/21/24 0427 02/21/24 0754  BP: 121/61 ROLLEN)  153/64 (!) 147/76 (!) 152/83  Pulse: 84 90 94 (!) 105  Resp: 19 18 19 18   Temp: 98 F (36.7 C) 98.6 F (37 C) 98.9 F (37.2 C) 98.9 F (37.2 C)  TempSrc:  Oral Oral Oral  SpO2: 94% 93% 92% 100%  Weight:      Height:        Intake/Output Summary (Last 24 hours) at 02/21/2024 1158 Last data filed at 02/21/2024 9062 Gross per 24 hour  Intake 2335.66 ml  Output 1101 ml  Net 1234.66 ml   Filed Weights   02/17/24 1420  Weight: 90.2 kg     Scheduled Meds:  acetaminophen        benzonatate   100 mg Oral TID   chlorhexidine        Chlorhexidine  Gluconate Cloth  6 each Topical Daily   heparin   5,000 Units Subcutaneous Q8H   insulin  aspart  0-15 Units Subcutaneous Q4H   insulin  aspart  3 Units Subcutaneous TID WC   insulin  glargine-yfgn  10 Units Subcutaneous QHS   metoprolol  succinate  50 mg Oral Daily   mupirocin  ointment  1 Application Nasal BID   povidone-iodine   2 Application Topical Once   Continuous Infusions:  sodium chloride  75 mL/hr at 02/21/24 0844   ampicillin -sulbactam (UNASYN ) IV 3 g (02/21/24 0845)   ceFAZolin      DAPTOmycin  700 mg (02/19/24 1234)    Nutritional status     Body mass index is 25.54 kg/m.  Data Reviewed:   CBC: Recent Labs  Lab 02/17/24 1448 02/18/24 0513 02/19/24 0241 02/20/24 0009  WBC 4.8 2.7* 2.7* 2.6*  NEUTROABS 3.4  --  1.7  --   HGB 12.6* 9.5* 9.7* 8.7*  HCT 38.8* 29.2* 29.6* 26.0*  MCV 81.5 82.3 82.0 80.5  PLT 364 273 257 255   Basic Metabolic Panel: Recent Labs  Lab 02/17/24 1448 02/18/24 0513 02/19/24 0241 02/20/24 0009 02/21/24 0515  NA 135 136 136 136 137  K 3.9 3.7 3.4* 3.6 4.1  CL 94* 99 103 104 106  CO2 20* 26 22 22  19*  GLUCOSE 267* 128* 103* 147* 115*  BUN 51* 50* 52* 54* 49*  CREATININE 4.82* 4.53* 4.33* 3.80* 3.49*  CALCIUM  8.7* 7.9* 7.8* 8.1* 8.0*  MG  --   --   --  1.5* 1.7  PHOS  --   --   --  3.7  --    GFR: Estimated Creatinine Clearance: 22.2 mL/min (A) (by C-G formula based on SCr of 3.49 mg/dL (H)). Liver Function Tests: Recent Labs  Lab 02/17/24 1448  AST 19  ALT 20  ALKPHOS 77  BILITOT 0.7  PROT 7.9  ALBUMIN 3.6   No results for input(s): LIPASE, AMYLASE in the last 168 hours. No results for input(s): AMMONIA in the last 168 hours. Coagulation Profile: Recent Labs  Lab 02/17/24 1448  INR 1.1   Cardiac Enzymes: Recent Labs  Lab 02/20/24 0009  CKTOTAL 17*   BNP (last 3 results) No results for input(s):  PROBNP in the last 8760 hours. HbA1C: No results for input(s): HGBA1C in the last 72 hours. CBG: Recent Labs  Lab 02/20/24 2010 02/21/24 0003 02/21/24 0425 02/21/24 0755 02/21/24 1139  GLUCAP 148* 114* 108* 113* 112*   Lipid Profile: No results for input(s): CHOL, HDL, LDLCALC, TRIG, CHOLHDL, LDLDIRECT in the last 72 hours. Thyroid  Function Tests: No results for input(s): TSH, T4TOTAL, FREET4, T3FREE, THYROIDAB in the last 72 hours. Anemia Panel: No results for input(s): VITAMINB12, FOLATE,  FERRITIN, TIBC, IRON, RETICCTPCT in the last 72 hours. Sepsis Labs: Recent Labs  Lab 02/17/24 1448 02/17/24 1627 02/18/24 0924  LATICACIDVEN 2.8* 2.5* 1.4    Recent Results (from the past 240 hours)  Resp panel by RT-PCR (RSV, Flu A&B, Covid) Anterior Nasal Swab     Status: None   Collection Time: 02/17/24  2:21 PM   Specimen: Anterior Nasal Swab  Result Value Ref Range Status   SARS Coronavirus 2 by RT PCR NEGATIVE NEGATIVE Final    Comment: (NOTE) SARS-CoV-2 target nucleic acids are NOT DETECTED.  The SARS-CoV-2 RNA is generally detectable in upper respiratory specimens during the acute phase of infection. The lowest concentration of SARS-CoV-2 viral copies this assay can detect is 138 copies/mL. A negative result does not preclude SARS-Cov-2 infection and should not be used as the sole basis for treatment or other patient management decisions. A negative result may occur with  improper specimen collection/handling, submission of specimen other than nasopharyngeal swab, presence of viral mutation(s) within the areas targeted by this assay, and inadequate number of viral copies(<138 copies/mL). A negative result must be combined with clinical observations, patient history, and epidemiological information. The expected result is Negative.  Fact Sheet for Patients:  bloggercourse.com  Fact Sheet for Healthcare  Providers:  seriousbroker.it  This test is no t yet approved or cleared by the [place]  FDA and  has been authorized for detection and/or diagnosis of SARS-CoV-2 by FDA under an Emergency Use Authorization (EUA). This EUA will remain  in effect (meaning this test can be used) for the duration of the COVID-19 declaration under Section 564(b)(1) of the Act, 21 U.S.C.section 360bbb-3(b)(1), unless the authorization is terminated  or revoked sooner.       Influenza A by PCR NEGATIVE NEGATIVE Final   Influenza B by PCR NEGATIVE NEGATIVE Final    Comment: (NOTE) The Xpert Xpress SARS-CoV-2/FLU/RSV plus assay is intended as an aid in the diagnosis of influenza from Nasopharyngeal swab specimens and should not be used as a sole basis for treatment. Nasal washings and aspirates are unacceptable for Xpert Xpress SARS-CoV-2/FLU/RSV testing.  Fact Sheet for Patients: bloggercourse.com  Fact Sheet for Healthcare Providers: seriousbroker.it  This test is not yet approved or cleared by the [place]  FDA and has been authorized for detection and/or diagnosis of SARS-CoV-2 by FDA under an Emergency Use Authorization (EUA). This EUA will remain in effect (meaning this test can be used) for the duration of the COVID-19 declaration under Section 564(b)(1) of the Act, 21 U.S.C. section 360bbb-3(b)(1), unless the authorization is terminated or revoked.     Resp Syncytial Virus by PCR NEGATIVE NEGATIVE Final    Comment: (NOTE) Fact Sheet for Patients: bloggercourse.com  Fact Sheet for Healthcare Providers: seriousbroker.it  This test is not yet approved or cleared by the [place]  FDA and has been authorized for detection and/or diagnosis of SARS-CoV-2 by FDA under an Emergency Use Authorization (EUA). This EUA will remain in effect (meaning this test can be  used) for the duration of the COVID-19 declaration under Section 564(b)(1) of the Act, 21 U.S.C. section 360bbb-3(b)(1), unless the authorization is terminated or revoked.  Performed at [facility], [address], [place], [place] [address]   Blood Culture (routine x 2)     Status: Abnormal   Collection Time: 02/17/24  2:48 PM   Specimen: BLOOD LEFT ARM  Result Value Ref Range Status   Specimen Description   Final    BLOOD LEFT ARM  BOTTLES DRAWN AEROBIC AND ANAEROBIC Performed at [facility], [address], [place], [place] [address]    Special Requests   Final    Blood Culture adequate volume Performed at [facility], [address], [place], [place] [address]    Culture  Setup Time   Final    GRAM POSITIVE COCCI ANAEROBIC BOTTLE ONLY Gram Stain Report Called to,Read Back By and Verified With: C. FULCHER ON 02/18/2024 @10 :13AM BY T. HAMER  CRITICAL RESULT CALLED TO, READ BACK BY AND VERIFIED WITH: RN DOROTHYANN ISLAND [phone] AT 1351 BY EC Performed at [facility], [address] [place], [place] [address]    Culture METHICILLIN RESISTANT STAPHYLOCOCCUS AUREUS (A)  Final   Report Status 02/20/2024 FINAL  Final   Organism ID, Bacteria METHICILLIN RESISTANT STAPHYLOCOCCUS AUREUS  Final      Susceptibility   Methicillin resistant staphylococcus aureus - MIC*    CIPROFLOXACIN  >=8 RESISTANT Resistant     ERYTHROMYCIN <=0.25 SENSITIVE Sensitive     GENTAMICIN <=0.5 SENSITIVE Sensitive     OXACILLIN >=4 RESISTANT Resistant     TETRACYCLINE <=1 SENSITIVE Sensitive     VANCOMYCIN  1 SENSITIVE Sensitive     TRIMETH /SULFA  <=10 SENSITIVE Sensitive     CLINDAMYCIN <=0.25 SENSITIVE Sensitive     RIFAMPIN <=0.5 SENSITIVE Sensitive     Inducible Clindamycin NEGATIVE Sensitive     LINEZOLID  2 SENSITIVE Sensitive     * METHICILLIN RESISTANT STAPHYLOCOCCUS AUREUS  Blood Culture ID Panel (Reflexed)     Status: Abnormal   Collection Time: 02/17/24  2:48 PM  Result Value Ref Range  Status   Enterococcus faecalis NOT DETECTED NOT DETECTED Final   Enterococcus Faecium NOT DETECTED NOT DETECTED Final   Listeria monocytogenes NOT DETECTED NOT DETECTED Final   Staphylococcus species DETECTED (A) NOT DETECTED Final    Comment: CRITICAL RESULT CALLED TO, READ BACK BY AND VERIFIED WITH: RN DOROTHYANN ISLAND [id number] AT 1351 BY EC    Staphylococcus aureus (BCID) DETECTED (A) NOT DETECTED Final    Comment: Methicillin (oxacillin)-resistant Staphylococcus aureus (MRSA). MRSA is predictably resistant to beta-lactam antibiotics (except ceftaroline). Preferred therapy is vancomycin  unless clinically contraindicated. Patient requires contact precautions if  hospitalized. CRITICAL RESULT CALLED TO, READ BACK BY AND VERIFIED WITH: RN DOROTHYANN ISLAND [phone] AT 1351 BY EC    Staphylococcus epidermidis NOT DETECTED NOT DETECTED Final   Staphylococcus lugdunensis NOT DETECTED NOT DETECTED Final   Streptococcus species NOT DETECTED NOT DETECTED Final   Streptococcus agalactiae NOT DETECTED NOT DETECTED Final   Streptococcus pneumoniae NOT DETECTED NOT DETECTED Final   Streptococcus pyogenes NOT DETECTED NOT DETECTED Final   A.calcoaceticus-baumannii NOT DETECTED NOT DETECTED Final   Bacteroides fragilis NOT DETECTED NOT DETECTED Final   Enterobacterales NOT DETECTED NOT DETECTED Final   Enterobacter cloacae complex NOT DETECTED NOT DETECTED Final   Escherichia coli NOT DETECTED NOT DETECTED Final   Klebsiella aerogenes NOT DETECTED NOT DETECTED Final   Klebsiella oxytoca NOT DETECTED NOT DETECTED Final   Klebsiella pneumoniae NOT DETECTED NOT DETECTED Final   Proteus species NOT DETECTED NOT DETECTED Final   Salmonella species NOT DETECTED NOT DETECTED Final   Serratia marcescens NOT DETECTED NOT DETECTED Final   Haemophilus influenzae NOT DETECTED NOT DETECTED Final   Neisseria meningitidis NOT DETECTED NOT DETECTED Final   Pseudomonas aeruginosa NOT DETECTED NOT DETECTED Final    Stenotrophomonas maltophilia NOT DETECTED NOT DETECTED Final   Candida albicans NOT DETECTED NOT DETECTED Final   Candida auris  NOT DETECTED NOT DETECTED Final   Candida glabrata NOT DETECTED NOT DETECTED Final   Candida krusei NOT DETECTED NOT DETECTED Final   Candida parapsilosis NOT DETECTED NOT DETECTED Final   Candida tropicalis NOT DETECTED NOT DETECTED Final   Cryptococcus neoformans/gattii NOT DETECTED NOT DETECTED Final   Meth resistant mecA/C and MREJ DETECTED (A) NOT DETECTED Final    Comment: CRITICAL RESULT CALLED TO, READ BACK BY AND VERIFIED WITH: RN DOROTHYANN ISLAND [phone] AT 1351 BY EC Performed at [facility], [address], [place], [place] [address]   MIC (1 Drug)-     Status: None (Preliminary result)   Collection Time: 02/17/24  2:48 PM  Result Value Ref Range Status   Min Inhibitory Conc (1 Drug) PENDING  Incomplete   Source DAPTOMYCIN  MIC MRSA BLOOD CULTURE  Final    Comment: Performed at [facility], [address], [place], [place] [address]  Blood Culture (routine x 2)     Status: None (Preliminary result)   Collection Time: 02/17/24  2:50 PM   Specimen: BLOOD LEFT WRIST  Result Value Ref Range Status   Specimen Description   Final    BLOOD LEFT WRIST BOTTLES DRAWN AEROBIC AND ANAEROBIC   Special Requests Blood Culture adequate volume  Final   Culture   Final    NO GROWTH 4 DAYS Performed at [facility], [address] [place], [place] [address]    Report Status PENDING  Incomplete  C Difficile Quick Screen w PCR reflex     Status: Abnormal   Collection Time: 02/18/24  6:35 PM   Specimen: Stool  Result Value Ref Range Status   C Diff antigen POSITIVE (A) NEGATIVE Final   C Diff toxin NEGATIVE NEGATIVE Final   C Diff interpretation Results are indeterminate. See PCR results.  Final    Comment: Performed at [facility], [address], [place], [place] [address]  C. Diff by PCR, Reflexed     Status: None   Collection Time:  02/18/24  6:35 PM  Result Value Ref Range Status   Toxigenic C. Difficile by PCR NEGATIVE NEGATIVE Final    Comment: Patient is colonized with non toxigenic C. difficile. May not need treatment unless significant symptoms are present.   Hypervirulent Strain PRESUMPTIVE NEGATIVE PRESUMPTIVE NEGATIVE Final    Comment: Performed at [facility], [address] [place], [place] [address]  Gastrointestinal Panel by PCR , Stool     Status: None   Collection Time: 02/18/24  8:02 PM  Result Value Ref Range Status   Campylobacter species NOT DETECTED NOT DETECTED Final   Plesimonas shigelloides NOT DETECTED NOT DETECTED Final   Salmonella species NOT DETECTED NOT DETECTED Final   Yersinia enterocolitica NOT DETECTED NOT DETECTED Final   Vibrio species NOT DETECTED NOT DETECTED Final   Vibrio cholerae NOT DETECTED NOT DETECTED Final   Enteroaggregative E coli (EAEC) NOT DETECTED NOT DETECTED Final   Enteropathogenic E coli (EPEC) NOT DETECTED NOT DETECTED Final   Enterotoxigenic E coli (ETEC) NOT DETECTED NOT DETECTED Final   Shiga like toxin producing E coli (STEC) NOT DETECTED NOT DETECTED Final   Shigella/Enteroinvasive E coli (EIEC) NOT DETECTED NOT DETECTED Final   Cryptosporidium NOT DETECTED NOT DETECTED Final   Cyclospora cayetanensis NOT DETECTED NOT DETECTED Final   Entamoeba histolytica NOT DETECTED NOT DETECTED Final   Giardia lamblia NOT DETECTED NOT DETECTED Final   Adenovirus F40/41 NOT DETECTED NOT DETECTED Final  Astrovirus NOT DETECTED NOT DETECTED Final   Norovirus GI/GII NOT DETECTED NOT DETECTED Final   Rotavirus A NOT DETECTED NOT DETECTED Final   Sapovirus (I, II, IV, and V) NOT DETECTED NOT DETECTED Final    Comment: Performed at [facility], [address] [place], [place] [address]  Respiratory (~20 pathogens) panel by PCR     Status: None   Collection Time: 02/19/24 12:58 AM   Specimen: Nasopharyngeal Swab; Respiratory  Result Value Ref Range  Status   Adenovirus NOT DETECTED NOT DETECTED Final   Coronavirus 229E NOT DETECTED NOT DETECTED Final    Comment: (NOTE) The Coronavirus on the Respiratory Panel, DOES NOT test for the novel  Coronavirus (2019 nCoV)    Coronavirus HKU1 NOT DETECTED NOT DETECTED Final   Coronavirus NL63 NOT DETECTED NOT DETECTED Final   Coronavirus OC43 NOT DETECTED NOT DETECTED Final   Metapneumovirus NOT DETECTED NOT DETECTED Final   Rhinovirus / Enterovirus NOT DETECTED NOT DETECTED Final   Influenza A NOT DETECTED NOT DETECTED Final   Influenza B NOT DETECTED NOT DETECTED Final   Parainfluenza Virus 1 NOT DETECTED NOT DETECTED Final   Parainfluenza Virus 2 NOT DETECTED NOT DETECTED Final   Parainfluenza Virus 3 NOT DETECTED NOT DETECTED Final   Parainfluenza Virus 4 NOT DETECTED NOT DETECTED Final   Respiratory Syncytial Virus NOT DETECTED NOT DETECTED Final   Bordetella pertussis NOT DETECTED NOT DETECTED Final   Bordetella Parapertussis NOT DETECTED NOT DETECTED Final   Chlamydophila pneumoniae NOT DETECTED NOT DETECTED Final   Mycoplasma pneumoniae NOT DETECTED NOT DETECTED Final    Comment: Performed at [facility], [address], [place], [place] [address]  Culture, blood (Routine X 2) w Reflex to ID Panel     Status: None (Preliminary result)   Collection Time: 02/19/24  2:39 AM   Specimen: BLOOD RIGHT HAND  Result Value Ref Range Status   Specimen Description BLOOD RIGHT HAND  Final   Special Requests   Final    BOTTLES DRAWN AEROBIC AND ANAEROBIC Blood Culture adequate volume IMMUNOCOMPROMISED   Culture   Final    NO GROWTH 2 DAYS Performed at [facility], [address] [place], [place] [address]    Report Status PENDING  Incomplete  Culture, blood (Routine X 2) w Reflex to ID Panel     Status: None (Preliminary result)   Collection Time: 02/19/24  2:39 AM   Specimen: BLOOD RIGHT ARM  Result Value Ref Range Status   Specimen Description BLOOD RIGHT ARM  Final    Special Requests   Final    Immunocompromised BOTTLES DRAWN AEROBIC AND ANAEROBIC Blood Culture adequate volume   Culture   Final    NO GROWTH 2 DAYS Performed at [facility], [address] [place], [place] [address]    Report Status PENDING  Incomplete  Urine Culture     Status: None   Collection Time: 02/19/24  6:32 AM   Specimen: Urine, Random  Result Value Ref Range Status   Specimen Description URINE, RANDOM  Final   Special Requests URINE, CLEAN CATCH  Final   Culture   Final    NO GROWTH Performed at [facility], [address], [place], [place] [address]    Report Status 02/20/2024 FINAL  Final  Surgical pcr screen     Status: Abnormal   Collection Time: 02/20/24  2:18 PM   Specimen: Nasal Mucosa; Nasal Swab  Result Value Ref Range Status   MRSA, PCR POSITIVE (A) NEGATIVE Final    Comment: RESULT CALLED TO, READ BACK BY AND VERIFIED WITH: RN CANDIE SHUCK [phone] @ [phone] FH    Staphylococcus aureus POSITIVE (A) NEGATIVE Final    Comment: (NOTE) The Xpert SA Assay (FDA approved for NASAL specimens in patients 19 years of age and older), is one component of a comprehensive surveillance program. It is not intended to diagnose infection nor to guide or monitor treatment. Performed at [facility], [address] [place], [place] [address]          Radiology Studies: US  RENAL Result Date: 02/19/2024 SOPHIA: US  Retroperitoneum Complete, Renal. 02/19/2024 08:49:00 PM TECHNIQUE: Real-time ultrasonography of the retroperitoneum renal was performed. COMPARISON: CT abdomen and pelvis 06/03/2023 and US  Renal 04/11/2019. CLINICAL HISTORY: AKI (acute kidney injury). FINDINGS: FINDINGS: RIGHT KIDNEY/URETER: Right kidney measures 11.7 x 5.6 x 5.7 cm. Normal cortical echogenicity. No hydronephrosis. No calculus. No mass. There is a cyst in the inferior pole of the right kidney measuring 7 x 7 x 8 mm. The right ureteral jet is seen. LEFT KIDNEY/URETER: Left kidney  measures 11.3 x 5.0 x 4.4 cm. Normal cortical echogenicity. No hydronephrosis. No calculus. No mass. There is a cyst in the superior pole of the left kidney measuring 2.5 x 2.4 x 2.2 cm. There is a simple cyst in the inferior pole of the left kidney measuring 2.7 x 2.4 x 2.5 cm. The left ureteral jet is not seen. BLADDER: Debris is visualized layering in the bladder. Only the right ureteral jet is seen. OTHER FINDINGS: Incidental finding of ascites in the perisplenic region and superior to the urinary bladder. IMPRESSION: 1. No acute renal abnormality identified. 2. Bilateral renal cysts. 3. Bladder debris. 4. Incidental small-volume ascites. Electronically signed by: Greig Pique MD 02/19/2024 10:25 PM EST RP Workstation: [id number]           LOS: 4 days   Time spent= 35 mins    Burgess JAYSON Dare, MD [facility]  If 7PM-7AM, please contact night-coverage  02/21/2024, 11:58 AM  "

## 2024-02-21 NOTE — Plan of Care (Signed)
" °  Problem: Education: Goal: Knowledge of General Education information will improve Description: Including pain rating scale, medication(s)/side effects and non-pharmacologic comfort measures Outcome: Not Progressing   Problem: Health Behavior/Discharge Planning: Goal: Ability to manage health-related needs will improve Outcome: Not Progressing   Problem: Clinical Measurements: Goal: Ability to maintain clinical measurements within normal limits will improve Outcome: Not Progressing Goal: Will remain free from infection Outcome: Not Progressing Goal: Diagnostic test results will improve Outcome: Not Progressing Goal: Respiratory complications will improve Outcome: Not Progressing Goal: Cardiovascular complication will be avoided Outcome: Not Progressing   Problem: Nutrition: Goal: Adequate nutrition will be maintained Outcome: Not Progressing   Problem: Coping: Goal: Level of anxiety will decrease Outcome: Not Progressing   Problem: Elimination: Goal: Will not experience complications related to bowel motility Outcome: Not Progressing Goal: Will not experience complications related to urinary retention Outcome: Not Progressing   Problem: Pain Managment: Goal: General experience of comfort will improve and/or be controlled Outcome: Not Progressing   Problem: Coping: Goal: Ability to adjust to condition or change in health will improve Outcome: Not Progressing   Problem: Fluid Volume: Goal: Ability to maintain a balanced intake and output will improve Outcome: Not Progressing   Problem: Health Behavior/Discharge Planning: Goal: Ability to identify and utilize available resources and services will improve Outcome: Not Progressing Goal: Ability to manage health-related needs will improve Outcome: Not Progressing   Problem: Skin Integrity: Goal: Risk for impaired skin integrity will decrease Outcome: Not Progressing   Problem: Tissue Perfusion: Goal: Adequacy of  tissue perfusion will improve Outcome: Not Progressing   "

## 2024-02-22 ENCOUNTER — Inpatient Hospital Stay (HOSPITAL_COMMUNITY): Admitting: Anesthesiology

## 2024-02-22 ENCOUNTER — Encounter (HOSPITAL_COMMUNITY)
Admission: EM | Disposition: A | Discharge status: Skilled Nursing Facility | Payer: Self-pay | Source: Home / Self Care | Attending: Internal Medicine

## 2024-02-22 ENCOUNTER — Encounter (HOSPITAL_COMMUNITY): Payer: Self-pay | Admitting: Orthopedic Surgery

## 2024-02-22 ENCOUNTER — Inpatient Hospital Stay (HOSPITAL_COMMUNITY)

## 2024-02-22 DIAGNOSIS — E1122 Type 2 diabetes mellitus with diabetic chronic kidney disease: Secondary | ICD-10-CM | POA: Diagnosis not present

## 2024-02-22 DIAGNOSIS — I129 Hypertensive chronic kidney disease with stage 1 through stage 4 chronic kidney disease, or unspecified chronic kidney disease: Secondary | ICD-10-CM

## 2024-02-22 DIAGNOSIS — A419 Sepsis, unspecified organism: Secondary | ICD-10-CM

## 2024-02-22 DIAGNOSIS — N1831 Chronic kidney disease, stage 3a: Secondary | ICD-10-CM | POA: Diagnosis not present

## 2024-02-22 DIAGNOSIS — R7881 Bacteremia: Secondary | ICD-10-CM

## 2024-02-22 DIAGNOSIS — E11628 Type 2 diabetes mellitus with other skin complications: Secondary | ICD-10-CM | POA: Diagnosis not present

## 2024-02-22 DIAGNOSIS — L089 Local infection of the skin and subcutaneous tissue, unspecified: Secondary | ICD-10-CM | POA: Diagnosis not present

## 2024-02-22 HISTORY — PX: TRANSESOPHAGEAL ECHOCARDIOGRAM (CATH LAB): EP1270

## 2024-02-22 LAB — CBC
HCT: 22.8 % — ABNORMAL LOW (ref 39.0–52.0)
Hemoglobin: 7.6 g/dL — ABNORMAL LOW (ref 13.0–17.0)
MCH: 26.8 pg (ref 26.0–34.0)
MCHC: 33.3 g/dL (ref 30.0–36.0)
MCV: 80.3 fL (ref 80.0–100.0)
Platelets: 233 K/uL (ref 150–400)
RBC: 2.84 MIL/uL — ABNORMAL LOW (ref 4.22–5.81)
RDW: 15.4 % (ref 11.5–15.5)
WBC: 2.5 K/uL — ABNORMAL LOW (ref 4.0–10.5)
nRBC: 0 % (ref 0.0–0.2)

## 2024-02-22 LAB — MAGNESIUM: Magnesium: 1.5 mg/dL — ABNORMAL LOW (ref 1.7–2.4)

## 2024-02-22 LAB — BASIC METABOLIC PANEL WITH GFR
Anion gap: 10 (ref 5–15)
BUN: 45 mg/dL — ABNORMAL HIGH (ref 8–23)
CO2: 20 mmol/L — ABNORMAL LOW (ref 22–32)
Calcium: 7.3 mg/dL — ABNORMAL LOW (ref 8.9–10.3)
Chloride: 108 mmol/L (ref 98–111)
Creatinine, Ser: 3.42 mg/dL — ABNORMAL HIGH (ref 0.61–1.24)
GFR, Estimated: 18 mL/min — ABNORMAL LOW
Glucose, Bld: 132 mg/dL — ABNORMAL HIGH (ref 70–99)
Potassium: 4 mmol/L (ref 3.5–5.1)
Sodium: 137 mmol/L (ref 135–145)

## 2024-02-22 LAB — GLUCOSE, CAPILLARY
Glucose-Capillary: 117 mg/dL — ABNORMAL HIGH (ref 70–99)
Glucose-Capillary: 147 mg/dL — ABNORMAL HIGH (ref 70–99)
Glucose-Capillary: 114 mg/dL — ABNORMAL HIGH (ref 70–99)
Glucose-Capillary: 103 mg/dL — ABNORMAL HIGH (ref 70–99)
Glucose-Capillary: 108 mg/dL — ABNORMAL HIGH (ref 70–99)
Glucose-Capillary: 117 mg/dL — ABNORMAL HIGH (ref 70–99)

## 2024-02-22 LAB — ECHO TEE

## 2024-02-22 LAB — CULTURE, BLOOD (ROUTINE X 2)
Culture: NO GROWTH
Special Requests: ADEQUATE

## 2024-02-22 MED ORDER — SODIUM CHLORIDE 0.9 % IV SOLN: INTRAVENOUS | Status: DC

## 2024-02-22 MED ORDER — PHENYLEPHRINE 80 MCG/ML (10ML) SYRINGE FOR IV PUSH (FOR BLOOD PRESSURE SUPPORT)
PREFILLED_SYRINGE | INTRAVENOUS | Status: DC | PRN
  Administered 2024-02-22 (×2): 160 ug via INTRAVENOUS

## 2024-02-22 MED ORDER — PROPOFOL 10 MG/ML IV BOLUS
INTRAVENOUS | Status: DC | PRN
  Administered 2024-02-22: 30 mg via INTRAVENOUS
  Administered 2024-02-22: 200 ug/kg/min via INTRAVENOUS

## 2024-02-22 NOTE — Progress Notes (Signed)
 Patient ID: [name], male   DOB: Jan 14, 1951, 74 y.o.   MRN: [id number] Patient is postoperative day 1 right below-knee amputation.  There is 100 cc in the wound VAC canister.  Patient is resting comfortably.  Anticipate discharge to skilled nursing facility.

## 2024-02-22 NOTE — Evaluation (Signed)
 Physical Therapy Evaluation Patient Details Name: [name] MRN: [id number] DOB: October 02, 1950 Today's Date: 02/22/2024  History of Present Illness  [name] is a 74 y.o. M adm 02/17/24 for concerns of sepsis with worsening right diabetic foot infection with abscess. MRI showed open wound along the medial aspect of the hindfoot with an underlying large complex abscess involving the subcutaneous tissues and also the underlying musculature as well as osteomyelitis involving the medial cuneiform and the calcaneus. 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  Readmit from SNF with R BKA 02/21/23.   Clinical Impression  Pt admitted with above diagnosis. PTA, pt was independent to modI for functional mobility. He reports ambulating within the house without an AD and using a RW when he left the house. Pt lives alone in a one story house with 1 STE. He states his niece can check-in on him daily after work. Pt currently with functional limitations due to the deficits listed below (see PT Problem List). He required minA to roll and modA for sidelying<>sit. Unable to attempt transfer despite +2 assist available d/t onset of nausea with pt dry heaving. RN entered to provide pt with nausea medication. Repositioned pt comfortably back in bed. Educated pt on positioning considerations following right BKA. Pt will benefit from acute skilled PT to increase his independence and safety with mobility to allow discharge. Recommend continued inpatient follow up therapy, <3 hours/day.    If plan is discharge home, recommend the following: Two people to help with walking and/or transfers;A lot of help with bathing/dressing/bathroom;Assistance with cooking/housework;Assist for transportation;Help with stairs or ramp for entrance   Can travel by private vehicle   No    Equipment  Recommendations Wheelchair (measurements PT);Wheelchair cushion (measurements PT);Hoyer lift;BSC/3in1  Recommendations for Other Services       Functional Status Assessment Patient has had a recent decline in their functional status and demonstrates the ability to make significant improvements in function in a reasonable and predictable amount of time.     Precautions / Restrictions Precautions Precautions: Fall Recall of Precautions/Restrictions: Impaired Precaution/Restrictions Comments: Wound Vac RLE Required Braces or Orthoses: Other Brace Other Brace: Hanger limb protector Restrictions Weight Bearing Restrictions Per Provider Order: Yes RLE Weight Bearing Per Provider Order: Non weight bearing      Mobility  Bed Mobility Overal bed mobility: Needs Assistance Bed Mobility: Rolling, Sidelying to Sit, Sit to Sidelying Rolling: Min assist, Used rails Sidelying to sit: Mod assist, HOB elevated, Used rails     Sit to sidelying: Mod assist General bed mobility comments: Pt sat up on L of bed with increased time. He rolled onto his left side using bed rail. Assist at pelvis/shoulder to rotate. Assist to bring BLE off EOB and elevate trunk. Scooted fwd with aid of bed pad. Returning to bed assist to bring BLE back in.    Transfers Overall transfer level: Needs assistance                 General transfer comment: Unable to attempt d/t onset of nausea with pt dry heaving.    Ambulation/Gait                  Stairs            Wheelchair Mobility     Tilt Bed    Modified Rankin (Stroke Patients Only)  Balance Overall balance assessment: Needs assistance Sitting-balance support: Feet supported, Bilateral upper extremity supported Sitting balance-Leahy Scale: Fair Sitting balance - Comments: Sat EOB with CGA. He demonstrated a posterior lean. Postural control: Posterior lean                                   Pertinent  Vitals/Pain Pain Assessment Pain Assessment: Faces Faces Pain Scale: Hurts even more Pain Location: Rt residual limb Pain Descriptors / Indicators: Grimacing, Operative site guarding, Tender, Sore Pain Intervention(s): Premedicated before session, Monitored during session, Limited activity within patient's tolerance, Repositioned    Home Living Family/patient expects to be discharged to:: Skilled nursing facility Living Arrangements: Alone Available Help at Discharge: Family;Available PRN/intermittently Type of Home: House Home Access: Stairs to enter Entrance Stairs-Rails: None Entrance Stairs-Number of Steps: 1   Home Layout: One level Home Equipment: Agricultural Consultant (2 wheels) Additional Comments: Has a boxer and a bulldog.    Prior Function Prior Level of Function : Independent/Modified Independent             Mobility Comments: Ambulates without an AD in the house and using a RW in the community. ADLs Comments: Indep with ADLs. Stands to shower. Cooks on stove. Niece aids with IADLs including taking him to appointments, manages meds, and pays the bills for him. Friends will help him with walking the dogs, he will just let them out of the house.     Extremity/Trunk Assessment   Upper Extremity Assessment Upper Extremity Assessment: Defer to OT evaluation    Lower Extremity Assessment Lower Extremity Assessment: Generalized weakness;RLE deficits/detail RLE Deficits / Details: Pt POD 1 s/p BKA. Decreased hip and knee AROM. Grossly 2+/5 strength. RLE: Unable to fully assess due to pain RLE Sensation: decreased light touch;decreased proprioception RLE Coordination: decreased gross motor    Cervical / Trunk Assessment Cervical / Trunk Assessment: Kyphotic  Communication   Communication Communication: Impaired Factors Affecting Communication: Reduced clarity of speech    Cognition Arousal: Alert Behavior During Therapy: Flat affect   PT - Cognitive impairments: No  family/caregiver present to determine baseline                       PT - Cognition Comments: Pt A,Ox4. Following commands: Impaired Following commands impaired: Follows one step commands with increased time     Cueing Cueing Techniques: Verbal cues     General Comments General comments (skin integrity, edema, etc.): Provided pt with HEP (Medbridge Access Code: [email or web address]). He declined education or practice of exercises currently d/t worsening nausea. RN entered to provide pt with medication.    Exercises     Assessment/Plan    PT Assessment Patient needs continued PT services  PT Problem List Decreased strength;Decreased range of motion;Decreased activity tolerance;Decreased balance;Decreased mobility;Decreased knowledge of use of DME;Decreased safety awareness;Decreased knowledge of precautions;Pain;Decreased skin integrity       PT Treatment Interventions DME instruction;Gait training;Functional mobility training;Therapeutic activities;Therapeutic exercise;Balance training;Cognitive remediation;Patient/family education;Wheelchair mobility training    PT Goals (Current goals can be found in the Care Plan section)  Acute Rehab PT Goals Patient Stated Goal: Have less pain and move better PT Goal Formulation: With patient Time For Goal Achievement: 03/07/24 Potential to Achieve Goals: Fair    Frequency Min 2X/week     Co-evaluation   Reason for Co-Treatment: Complexity of the patient's impairments (multi-system involvement);For patient/therapist safety;To address functional/ADL transfers PT goals  addressed during session: Mobility/safety with mobility;Balance;Proper use of DME OT goals addressed during session: ADL's and self-care       AM-PAC PT 6 Clicks Mobility  Outcome Measure Help needed turning from your back to your side while in a flat bed without using bedrails?: A Little Help needed moving from lying on your back to sitting on the side of a flat bed  without using bedrails?: A Lot Help needed moving to and from a bed to a chair (including a wheelchair)?: Total Help needed standing up from a chair using your arms (e.g., wheelchair or bedside chair)?: Total Help needed to walk in hospital room?: Total Help needed climbing 3-5 steps with a railing? : Total 6 Click Score: 9    End of Session   Activity Tolerance: Other (comment) (Treatment limited secondary to nausea) Patient left: in bed;with call bell/phone within reach;with bed alarm set Nurse Communication: Mobility status;Other (comment) (pt's nausea and dry heaving) PT Visit Diagnosis: Difficulty in walking, not elsewhere classified (R26.2);Other abnormalities of gait and mobility (R26.89);Unsteadiness on feet (R26.81)    Time: 9146-9081 PT Time Calculation (min) (ACUTE ONLY): 25 min   Charges:   PT Evaluation $PT Eval Moderate Complexity: 1 Mod   PT General Charges $$ ACUTE PT VISIT: 1 Visit         Randall SAUNDERS, PT, DPT Acute Rehabilitation Services Office: [phone] Secure Chat Preferred  Delon CHRISTELLA Callander 02/22/2024, 10:29 AM

## 2024-02-22 NOTE — Anesthesia Preprocedure Evaluation (Addendum)
"                                    Anesthesia Evaluation  Patient identified by MRN, date of birth, ID band Patient awake    Reviewed: Allergy  & Precautions, NPO status , Patient's Chart, lab work & pertinent test results  Airway Mallampati: II  TM Distance: >3 FB Neck ROM: Full    Dental no notable dental hx. (+) Poor Dentition, Missing   Pulmonary neg pulmonary ROS   Pulmonary exam normal        Cardiovascular hypertension,  Rhythm:Regular Rate:Normal     Neuro/Psych   Anxiety Depression    CVA    GI/Hepatic Neg liver ROS,GERD  ,,  Endo/Other  diabetes    Renal/GU   negative genitourinary   Musculoskeletal  (+) Arthritis , Osteoarthritis,    Abdominal Normal abdominal exam  (+)   Peds  Hematology  (+) Blood dyscrasia, anemia   Anesthesia Other Findings   Reproductive/Obstetrics                              Anesthesia Physical Anesthesia Plan  ASA: 3  Anesthesia Plan: MAC   Post-op Pain Management:    Induction: Intravenous  PONV Risk Score and Plan: 1 and Propofol  infusion and Treatment may vary due to age or medical condition  Airway Management Planned: Simple Face Mask and Nasal Cannula  Additional Equipment: None  Intra-op Plan:   Post-operative Plan:   Informed Consent:      Dental advisory given  Plan Discussed with: CRNA  Anesthesia Plan Comments:          Anesthesia Quick Evaluation  "

## 2024-02-22 NOTE — Progress Notes (Signed)
 Called to bedside, patient had more questions about TEE. He asked someone told me I can die. Risk of TEE re-explained. He again agreed with TEE today. Communicate with nursing team.

## 2024-02-22 NOTE — Anesthesia Postprocedure Evaluation (Signed)
"   Anesthesia Post Note  Patient: [name]  Procedure(s) Performed: TRANSESOPHAGEAL ECHOCARDIOGRAM     Patient location during evaluation: PACU Anesthesia Type: MAC Level of consciousness: awake and alert Pain management: pain level controlled Vital Signs Assessment: post-procedure vital signs reviewed and stable Respiratory status: spontaneous breathing, nonlabored ventilation, respiratory function stable and patient connected to nasal cannula oxygen Cardiovascular status: stable and blood pressure returned to baseline Postop Assessment: no apparent nausea or vomiting Anesthetic complications: no   No notable events documented.  Last Vitals:  Vitals:   02/22/24 1253 02/22/24 1321  BP: (!) 126/49 136/71  Pulse: 89 91  Resp:  18  Temp:  36.8 C  SpO2: 94% 95%    Last Pain:  Vitals:   02/22/24 1321  TempSrc: Oral  PainSc:                  [name] [name]      "

## 2024-02-22 NOTE — H&P (View-Only) (Signed)
 " PROGRESS NOTE    [name]  FMW:[id number] DOB: 05-23-1950 DOA: 02/17/2024 PCP: Marvine Rush, MD    Brief Narrative:   74 year old with history of CVA with residual slurred speech, CKD 3 AA, chronic osteomyelitis s/p right TMA, HTN, DM 2 admitted to the hospital for generally feeling sick and having nausea/vomiting.  Workup showed concerns of sepsis with worsening right diabetic foot infection with abscess.  Dr. Harden was consulted and patient was transferred from [facility] to [facility] for surgical intervention.  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  Hospital course complicated by gram-positive bacteremia, echocardiogram unremarkable.  Plans for TEE today.    Assessment & Plan:   Sepsis secondary to right diabetic foot infection associated with abscess and osteomyelitis Gram-positive bacteremia -Orthopedic consulted.  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  Postop recommendations by their service. - ID team is following -Repeat blood cultures 1/5 - NGTD -Echocardiogram showing preserved EF 65%, grade 1 DD.  No evidence of vegetation -TEE planned today   Anemia - Combination of delusional and likely postsurgical blood loss.  Closely monitor.  Transfuse hemoglobin less than 7     Type 2 diabetes mellitus with neurological complications, uncontrolled with hyperglycemia -A1c 9.8.  Sliding scale and Accu-Chek.  Will adjust as necessary   AKI on CKD stage 3b - Baseline creatinine around 2.0, admission creatinine 4.82 with IV fluids renal function is slowly improving, creatinine 3.42 - Renal ultrasound does not show acute pathology   Gastroenteritis -Improved.  C. difficile antigen positive per toxigenic PCR is negative.  GI panel is neg.    Diffuse macular rash  -Had mild symptoms, unclear.  Improving   Essential hypertension  Sinus tachycardia -Toprol -XL IV as needed  Hypomagnesemia/hypokalemia - As needed repletion    DVT prophylaxis: heparin  Code Status:  Full  Family Communication:  Disposition: Plans for TEE  PT Follow up Recs:   Subjective: Doing okay no complaints Waiting for his TEE   Examination:  General exam: Appears calm and comfortable  Respiratory system: Clear to auscultation. Respiratory effort normal. Cardiovascular system: S1 & S2 heard, RRR. No JVD, murmurs, rubs, gallops or clicks. No pedal edema. Gastrointestinal system: Abdomen is nondistended, soft and nontender. No organomegaly or masses felt. Normal bowel sounds heard. Central nervous system: Alert and oriented. No focal neurological deficits.  Chronic slurred speech Extremities: Symmetric 5 x 5 power.  Lower extremity dressing noted Skin: Diffuse maculopapular rash appears to be better Psychiatry: Judgement and insight appear normal. Mood & affect appropriate.                Diet Orders (From admission, onward)     Start     Ordered   02/22/24 0411  Diet NPO time specified Except for: Sips with Meds  Diet effective now       Question:  Except for  Answer:  Sips with Meds   02/22/24 0410            Objective: Vitals:   02/21/24 1620 02/21/24 1940 02/22/24 0405 02/22/24 0744  BP: (!) 149/75 (!) 140/57 123/60 (!) 142/64  Pulse: 90 92 92 (!) 102  Resp: 20 17 17 18   Temp: 98.4 F (36.9 C) 98.6 F (37 C) 99.1 F (37.3 C) 98.7 F (37.1 C)  TempSrc: Oral Axillary Axillary Oral  SpO2: 100% 95% 96% 95%  Weight:      Height:        Intake/Output Summary (Last 24 hours) at 02/22/2024 1150 Last  data filed at 02/22/2024 0900 Gross per 24 hour  Intake 837.26 ml  Output 10 ml  Net 827.26 ml   Filed Weights   02/17/24 1420 02/21/24 1215  Weight: 90.2 kg 90.2 kg    Scheduled Meds:  [MAR Hold] acetaminophen   1,000 mg Oral Q6H   [MAR Hold] acidophilus  1 capsule Oral Daily   [MAR Hold] ascorbic acid   1,000 mg Oral Daily   [MAR Hold] benzonatate   100 mg Oral TID   [MAR Hold] Chlorhexidine  Gluconate Cloth  6 each Topical Daily   [MAR Hold]  docusate sodium   100 mg Oral Daily   [MAR Hold] ferrous sulfate   325 mg Oral TID WC   [MAR Hold] heparin   5,000 Units Subcutaneous Q8H   [MAR Hold] insulin  aspart  0-15 Units Subcutaneous Q4H   [MAR Hold] insulin  aspart  3 Units Subcutaneous TID WC   [MAR Hold] insulin  glargine-yfgn  10 Units Subcutaneous QHS   [MAR Hold] metoprolol  succinate  50 mg Oral Daily   [MAR Hold] mupirocin  ointment  1 Application Nasal BID   [MAR Hold] nutrition supplement (JUVEN)  1 packet Oral BID BM   [MAR Hold] sodium chloride  flush  3 mL Intravenous Q12H   [MAR Hold] Vitamin D  (Ergocalciferol )  50,000 Units Oral QHS   [MAR Hold] zinc  sulfate (50mg  elemental zinc )  220 mg Oral Daily   Continuous Infusions:  sodium chloride      sodium chloride  75 mL/hr at 02/21/24 0844   [MAR Hold] sodium chloride      [MAR Hold] DAPTOmycin  700 mg (02/21/24 1658)    Nutritional status     Body mass index is 25.53 kg/m.  Data Reviewed:   CBC: Recent Labs  Lab 02/17/24 1448 02/18/24 0513 02/19/24 0241 02/20/24 0009 02/22/24 0216  WBC 4.8 2.7* 2.7* 2.6* 2.5*  NEUTROABS 3.4  --  1.7  --   --   HGB 12.6* 9.5* 9.7* 8.7* 7.6*  HCT 38.8* 29.2* 29.6* 26.0* 22.8*  MCV 81.5 82.3 82.0 80.5 80.3  PLT 364 273 257 255 233   Basic Metabolic Panel: Recent Labs  Lab 02/18/24 0513 02/19/24 0241 02/20/24 0009 02/21/24 0515 02/22/24 0216  NA 136 136 136 137 137  K 3.7 3.4* 3.6 4.1 4.0  CL 99 103 104 106 108  CO2 26 22 22  19* 20*  GLUCOSE 128* 103* 147* 115* 132*  BUN 50* 52* 54* 49* 45*  CREATININE 4.53* 4.33* 3.80* 3.49* 3.42*  CALCIUM  7.9* 7.8* 8.1* 8.0* 7.3*  MG  --   --  1.5* 1.7 1.5*  PHOS  --   --  3.7  --   --    GFR: Estimated Creatinine Clearance: 22.7 mL/min (A) (by C-G formula based on SCr of 3.42 mg/dL (H)). Liver Function Tests: Recent Labs  Lab 02/17/24 1448  AST 19  ALT 20  ALKPHOS 77  BILITOT 0.7  PROT 7.9  ALBUMIN 3.6   No results for input(s): LIPASE, AMYLASE in the last 168  hours. No results for input(s): AMMONIA in the last 168 hours. Coagulation Profile: Recent Labs  Lab 02/17/24 1448  INR 1.1   Cardiac Enzymes: Recent Labs  Lab 02/20/24 0009  CKTOTAL 17*   BNP (last 3 results) No results for input(s): PROBNP in the last 8760 hours. HbA1C: No results for input(s): HGBA1C in the last 72 hours. CBG: Recent Labs  Lab 02/21/24 1631 02/21/24 1937 02/21/24 2317 02/22/24 0401 02/22/24 0740  GLUCAP 105* 167* 146* 117* 147*   Lipid  Profile: No results for input(s): CHOL, HDL, LDLCALC, TRIG, CHOLHDL, LDLDIRECT in the last 72 hours. Thyroid  Function Tests: No results for input(s): TSH, T4TOTAL, FREET4, T3FREE, THYROIDAB in the last 72 hours. Anemia Panel: No results for input(s): VITAMINB12, FOLATE, FERRITIN, TIBC, IRON, RETICCTPCT in the last 72 hours. Sepsis Labs: Recent Labs  Lab 02/17/24 1448 02/17/24 1627 02/18/24 0924  LATICACIDVEN 2.8* 2.5* 1.4    Recent Results (from the past 240 hours)  Resp panel by RT-PCR (RSV, Flu A&B, Covid) Anterior Nasal Swab     Status: None   Collection Time: 02/17/24  2:21 PM   Specimen: Anterior Nasal Swab  Result Value Ref Range Status   SARS Coronavirus 2 by RT PCR NEGATIVE NEGATIVE Final    Comment: (NOTE) SARS-CoV-2 target nucleic acids are NOT DETECTED.  The SARS-CoV-2 RNA is generally detectable in upper respiratory specimens during the acute phase of infection. The lowest concentration of SARS-CoV-2 viral copies this assay can detect is 138 copies/mL. A negative result does not preclude SARS-Cov-2 infection and should not be used as the sole basis for treatment or other patient management decisions. A negative result may occur with  improper specimen collection/handling, submission of specimen other than nasopharyngeal swab, presence of viral mutation(s) within the areas targeted by this assay, and inadequate number of viral copies(<138 copies/mL). A  negative result must be combined with clinical observations, patient history, and epidemiological information. The expected result is Negative.  Fact Sheet for Patients:  bloggercourse.com  Fact Sheet for Healthcare Providers:  seriousbroker.it  This test is no t yet approved or cleared by the [place]  FDA and  has been authorized for detection and/or diagnosis of SARS-CoV-2 by FDA under an Emergency Use Authorization (EUA). This EUA will remain  in effect (meaning this test can be used) for the duration of the COVID-19 declaration under Section 564(b)(1) of the Act, 21 U.S.C.section 360bbb-3(b)(1), unless the authorization is terminated  or revoked sooner.       Influenza A by PCR NEGATIVE NEGATIVE Final   Influenza B by PCR NEGATIVE NEGATIVE Final    Comment: (NOTE) The Xpert Xpress SARS-CoV-2/FLU/RSV plus assay is intended as an aid in the diagnosis of influenza from Nasopharyngeal swab specimens and should not be used as a sole basis for treatment. Nasal washings and aspirates are unacceptable for Xpert Xpress SARS-CoV-2/FLU/RSV testing.  Fact Sheet for Patients: bloggercourse.com  Fact Sheet for Healthcare Providers: seriousbroker.it  This test is not yet approved or cleared by the [place]  FDA and has been authorized for detection and/or diagnosis of SARS-CoV-2 by FDA under an Emergency Use Authorization (EUA). This EUA will remain in effect (meaning this test can be used) for the duration of the COVID-19 declaration under Section 564(b)(1) of the Act, 21 U.S.C. section 360bbb-3(b)(1), unless the authorization is terminated or revoked.     Resp Syncytial Virus by PCR NEGATIVE NEGATIVE Final    Comment: (NOTE) Fact Sheet for Patients: bloggercourse.com  Fact Sheet for Healthcare  Providers: seriousbroker.it  This test is not yet approved or cleared by the [place]  FDA and has been authorized for detection and/or diagnosis of SARS-CoV-2 by FDA under an Emergency Use Authorization (EUA). This EUA will remain in effect (meaning this test can be used) for the duration of the COVID-19 declaration under Section 564(b)(1) of the Act, 21 U.S.C. section 360bbb-3(b)(1), unless the authorization is terminated or revoked.  Performed at [facility], [address] [place], [place] [address]   Blood Culture (routine  x 2)     Status: Abnormal   Collection Time: 02/17/24  2:48 PM   Specimen: BLOOD LEFT ARM  Result Value Ref Range Status   Specimen Description   Final    BLOOD LEFT ARM BOTTLES DRAWN AEROBIC AND ANAEROBIC Performed at [facility], [address], [place], [place] [address]    Special Requests   Final    Blood Culture adequate volume Performed at [facility], [address] [place], [place] [address]    Culture  Setup Time   Final    GRAM POSITIVE COCCI ANAEROBIC BOTTLE ONLY Gram Stain Report Called to,Read Back By and Verified With: C. ANTONE ON 02/18/2024 @10 :13AM BY T. HAMER  CRITICAL RESULT CALLED TO, READ BACK BY AND VERIFIED WITH: RN DOROTHYANN ANTONE [phone] AT 1351 BY EC Performed at [facility], [address], [place], [place] [address]    Culture METHICILLIN RESISTANT STAPHYLOCOCCUS AUREUS (A)  Final   Report Status 02/20/2024 FINAL  Final   Organism ID, Bacteria METHICILLIN RESISTANT STAPHYLOCOCCUS AUREUS  Final      Susceptibility   Methicillin resistant staphylococcus aureus - MIC*    CIPROFLOXACIN  >=8 RESISTANT Resistant     ERYTHROMYCIN <=0.25 SENSITIVE Sensitive     GENTAMICIN <=0.5 SENSITIVE Sensitive     OXACILLIN >=4 RESISTANT Resistant     TETRACYCLINE <=1 SENSITIVE Sensitive     VANCOMYCIN  1 SENSITIVE Sensitive     TRIMETH /SULFA  <=10 SENSITIVE Sensitive     CLINDAMYCIN <=0.25 SENSITIVE  Sensitive     RIFAMPIN <=0.5 SENSITIVE Sensitive     Inducible Clindamycin NEGATIVE Sensitive     LINEZOLID  2 SENSITIVE Sensitive     * METHICILLIN RESISTANT STAPHYLOCOCCUS AUREUS  Blood Culture ID Panel (Reflexed)     Status: Abnormal   Collection Time: 02/17/24  2:48 PM  Result Value Ref Range Status   Enterococcus faecalis NOT DETECTED NOT DETECTED Final   Enterococcus Faecium NOT DETECTED NOT DETECTED Final   Listeria monocytogenes NOT DETECTED NOT DETECTED Final   Staphylococcus species DETECTED (A) NOT DETECTED Final    Comment: CRITICAL RESULT CALLED TO, READ BACK BY AND VERIFIED WITH: RN DOROTHYANN ANTONE [phone] AT 1351 BY EC    Staphylococcus aureus (BCID) DETECTED (A) NOT DETECTED Final    Comment: Methicillin (oxacillin)-resistant Staphylococcus aureus (MRSA). MRSA is predictably resistant to beta-lactam antibiotics (except ceftaroline). Preferred therapy is vancomycin  unless clinically contraindicated. Patient requires contact precautions if  hospitalized. CRITICAL RESULT CALLED TO, READ BACK BY AND VERIFIED WITH: RN DOROTHYANN ANTONE [phone] AT 1351 BY EC    Staphylococcus epidermidis NOT DETECTED NOT DETECTED Final   Staphylococcus lugdunensis NOT DETECTED NOT DETECTED Final   Streptococcus species NOT DETECTED NOT DETECTED Final   Streptococcus agalactiae NOT DETECTED NOT DETECTED Final   Streptococcus pneumoniae NOT DETECTED NOT DETECTED Final   Streptococcus pyogenes NOT DETECTED NOT DETECTED Final   A.calcoaceticus-baumannii NOT DETECTED NOT DETECTED Final   Bacteroides fragilis NOT DETECTED NOT DETECTED Final   Enterobacterales NOT DETECTED NOT DETECTED Final   Enterobacter cloacae complex NOT DETECTED NOT DETECTED Final   Escherichia coli NOT DETECTED NOT DETECTED Final   Klebsiella aerogenes NOT DETECTED NOT DETECTED Final   Klebsiella oxytoca NOT DETECTED NOT DETECTED Final   Klebsiella pneumoniae NOT DETECTED NOT DETECTED Final   Proteus species NOT  DETECTED NOT DETECTED Final   Salmonella species NOT DETECTED NOT DETECTED Final   Serratia marcescens NOT DETECTED NOT DETECTED Final   Haemophilus influenzae NOT DETECTED NOT DETECTED  Final   Neisseria meningitidis NOT DETECTED NOT DETECTED Final   Pseudomonas aeruginosa NOT DETECTED NOT DETECTED Final   Stenotrophomonas maltophilia NOT DETECTED NOT DETECTED Final   Candida albicans NOT DETECTED NOT DETECTED Final   Candida auris NOT DETECTED NOT DETECTED Final   Candida glabrata NOT DETECTED NOT DETECTED Final   Candida krusei NOT DETECTED NOT DETECTED Final   Candida parapsilosis NOT DETECTED NOT DETECTED Final   Candida tropicalis NOT DETECTED NOT DETECTED Final   Cryptococcus neoformans/gattii NOT DETECTED NOT DETECTED Final   Meth resistant mecA/C and MREJ DETECTED (A) NOT DETECTED Final    Comment: CRITICAL RESULT CALLED TO, READ BACK BY AND VERIFIED WITH: RN DOROTHYANN ISLAND [phone] AT 1351 BY EC Performed at [facility], [address] [place], [place] [address]   MIC (1 Drug)-     Status: None (Preliminary result)   Collection Time: 02/17/24  2:48 PM  Result Value Ref Range Status   Min Inhibitory Conc (1 Drug) PENDING  Incomplete   Source DAPTOMYCIN  MIC MRSA BLOOD CULTURE  Final    Comment: Performed at [facility], [address] [place], [place] [address]  Blood Culture (routine x 2)     Status: None   Collection Time: 02/17/24  2:50 PM   Specimen: BLOOD LEFT WRIST  Result Value Ref Range Status   Specimen Description   Final    BLOOD LEFT WRIST BOTTLES DRAWN AEROBIC AND ANAEROBIC   Special Requests Blood Culture adequate volume  Final   Culture   Final    NO GROWTH 5 DAYS Performed at [facility], [address], [place], [place] [address]    Report Status 02/22/2024 FINAL  Final  C Difficile Quick Screen w PCR reflex     Status: Abnormal   Collection Time: 02/18/24  6:35 PM   Specimen: Stool  Result Value Ref Range Status   C Diff antigen  POSITIVE (A) NEGATIVE Final   C Diff toxin NEGATIVE NEGATIVE Final   C Diff interpretation Results are indeterminate. See PCR results.  Final    Comment: Performed at [facility], [address] [place], [place] [address]  C. Diff by PCR, Reflexed     Status: None   Collection Time: 02/18/24  6:35 PM  Result Value Ref Range Status   Toxigenic C. Difficile by PCR NEGATIVE NEGATIVE Final    Comment: Patient is colonized with non toxigenic C. difficile. May not need treatment unless significant symptoms are present.   Hypervirulent Strain PRESUMPTIVE NEGATIVE PRESUMPTIVE NEGATIVE Final    Comment: Performed at [facility], [address], [place], [place] [address]  Gastrointestinal Panel by PCR , Stool     Status: None   Collection Time: 02/18/24  8:02 PM  Result Value Ref Range Status   Campylobacter species NOT DETECTED NOT DETECTED Final   Plesimonas shigelloides NOT DETECTED NOT DETECTED Final   Salmonella species NOT DETECTED NOT DETECTED Final   Yersinia enterocolitica NOT DETECTED NOT DETECTED Final   Vibrio species NOT DETECTED NOT DETECTED Final   Vibrio cholerae NOT DETECTED NOT DETECTED Final   Enteroaggregative E coli (EAEC) NOT DETECTED NOT DETECTED Final   Enteropathogenic E coli (EPEC) NOT DETECTED NOT DETECTED Final   Enterotoxigenic E coli (ETEC) NOT DETECTED NOT DETECTED Final   Shiga like toxin producing E coli (STEC) NOT DETECTED NOT DETECTED Final   Shigella/Enteroinvasive E coli (EIEC) NOT DETECTED NOT DETECTED Final   Cryptosporidium NOT DETECTED  NOT DETECTED Final   Cyclospora cayetanensis NOT DETECTED NOT DETECTED Final   Entamoeba histolytica NOT DETECTED NOT DETECTED Final   Giardia lamblia NOT DETECTED NOT DETECTED Final   Adenovirus F40/41 NOT DETECTED NOT DETECTED Final   Astrovirus NOT DETECTED NOT DETECTED Final   Norovirus GI/GII NOT DETECTED NOT DETECTED Final   Rotavirus A NOT DETECTED NOT DETECTED Final   Sapovirus (I, II, IV, and V) NOT  DETECTED NOT DETECTED Final    Comment: Performed at [facility], [address] [place], [place] [address]  Respiratory (~20 pathogens) panel by PCR     Status: None   Collection Time: 02/19/24 12:58 AM   Specimen: Nasopharyngeal Swab; Respiratory  Result Value Ref Range Status   Adenovirus NOT DETECTED NOT DETECTED Final   Coronavirus 229E NOT DETECTED NOT DETECTED Final    Comment: (NOTE) The Coronavirus on the Respiratory Panel, DOES NOT test for the novel  Coronavirus (2019 nCoV)    Coronavirus HKU1 NOT DETECTED NOT DETECTED Final   Coronavirus NL63 NOT DETECTED NOT DETECTED Final   Coronavirus OC43 NOT DETECTED NOT DETECTED Final   Metapneumovirus NOT DETECTED NOT DETECTED Final   Rhinovirus / Enterovirus NOT DETECTED NOT DETECTED Final   Influenza A NOT DETECTED NOT DETECTED Final   Influenza B NOT DETECTED NOT DETECTED Final   Parainfluenza Virus 1 NOT DETECTED NOT DETECTED Final   Parainfluenza Virus 2 NOT DETECTED NOT DETECTED Final   Parainfluenza Virus 3 NOT DETECTED NOT DETECTED Final   Parainfluenza Virus 4 NOT DETECTED NOT DETECTED Final   Respiratory Syncytial Virus NOT DETECTED NOT DETECTED Final   Bordetella pertussis NOT DETECTED NOT DETECTED Final   Bordetella Parapertussis NOT DETECTED NOT DETECTED Final   Chlamydophila pneumoniae NOT DETECTED NOT DETECTED Final   Mycoplasma pneumoniae NOT DETECTED NOT DETECTED Final    Comment: Performed at [facility], [address] [place], [place] [address]  Culture, blood (Routine X 2) w Reflex to ID Panel     Status: None (Preliminary result)   Collection Time: 02/19/24  2:39 AM   Specimen: BLOOD RIGHT HAND  Result Value Ref Range Status   Specimen Description BLOOD RIGHT HAND  Final   Special Requests   Final    BOTTLES DRAWN AEROBIC AND ANAEROBIC Blood Culture adequate volume IMMUNOCOMPROMISED   Culture   Final    NO GROWTH 3 DAYS Performed at [facility], [address], [place],  [place] [address]    Report Status PENDING  Incomplete  Culture, blood (Routine X 2) w Reflex to ID Panel     Status: None (Preliminary result)   Collection Time: 02/19/24  2:39 AM   Specimen: BLOOD RIGHT ARM  Result Value Ref Range Status   Specimen Description BLOOD RIGHT ARM  Final   Special Requests   Final    Immunocompromised BOTTLES DRAWN AEROBIC AND ANAEROBIC Blood Culture adequate volume   Culture   Final    NO GROWTH 3 DAYS Performed at [facility], [address] [place], [place] [address]    Report Status PENDING  Incomplete  Urine Culture     Status: None   Collection Time: 02/19/24  6:32 AM   Specimen: Urine, Random  Result Value Ref Range Status   Specimen Description URINE, RANDOM  Final   Special Requests URINE, CLEAN CATCH  Final   Culture   Final    NO GROWTH Performed at [facility], [address] [place], [place] [address]    Report Status 02/20/2024 FINAL  Final  Surgical pcr screen     Status: Abnormal   Collection Time: 02/20/24  2:18 PM   Specimen: Nasal Mucosa; Nasal Swab  Result Value Ref Range Status   MRSA, PCR POSITIVE (A) NEGATIVE Final    Comment: RESULT CALLED TO, READ BACK BY AND VERIFIED WITH: RN CANDIE SHUCK [phone] @ [phone] FH    Staphylococcus aureus POSITIVE (A) NEGATIVE Final    Comment: (NOTE) The Xpert SA Assay (FDA approved for NASAL specimens in patients 29 years of age and older), is one component of a comprehensive surveillance program. It is not intended to diagnose infection nor to guide or monitor treatment. Performed at [facility], [address], [place], [place] [address]          Radiology Studies: No results found.         LOS: 5 days   Time spent= 35 mins    Burgess JAYSON Dare, MD [facility]  If 7PM-7AM, please contact night-coverage  02/22/2024, 11:50 AM  "

## 2024-02-22 NOTE — Evaluation (Signed)
 Occupational Therapy Evaluation Patient Details Name: [name] MRN: [id number] DOB: [birth year] Today's Date: 02/22/2024   History of Present Illness   [name] is a 74 y.o. M adm 02/17/24 for concerns of sepsis with worsening right diabetic foot infection with abscess. MRI showed open wound along the medial aspect of the hindfoot with an underlying large complex abscess involving the subcutaneous tissues and also the underlying musculature as well as osteomyelitis involving the medial cuneiform and the calcaneus. 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  Readmit from SNF with R BKA 02/21/23.     Clinical Impressions Patient admitted for the above procedure.  Currently limited by dizziness and nausea.  Able to sit EOB, but eventually had to be laid back down.  OT will continue efforts in the acute setting to address deficits, and Patient will benefit from continued inpatient follow up therapy, <3 hours/day.     If plan is discharge home, recommend the following:   Assist for transportation;A lot of help with bathing/dressing/bathroom;Two people to help with walking and/or transfers     Functional Status Assessment   Patient has had a recent decline in their functional status and demonstrates the ability to make significant improvements in function in a reasonable and predictable amount of time.     Equipment Recommendations   None recommended by OT     Recommendations for Other Services         Precautions/Restrictions   Precautions Precautions: Fall Precaution/Restrictions Comments: Wound Vac RLE Required Braces or Orthoses: Other Brace Other Brace: Hanger limb protector Restrictions Weight Bearing Restrictions Per Provider Order: Yes RLE Weight Bearing Per Provider Order: Non weight bearing     Mobility Bed  Mobility Overal bed mobility: Needs Assistance Bed Mobility: Supine to Sit, Sit to Supine     Supine to sit: Mod assist Sit to supine: Mod assist        Transfers                   General transfer comment: unable due to nausea      Balance Overall balance assessment: Needs assistance Sitting-balance support: Feet supported, Bilateral upper extremity supported Sitting balance-Leahy Scale: Fair   Postural control: Posterior lean                                 ADL either performed or assessed with clinical judgement   ADL Overall ADL's : Needs assistance/impaired Eating/Feeding: Set up;Bed level   Grooming: Wash/dry hands;Wash/dry face;Contact guard assist;Sitting   Upper Body Bathing: Minimal assistance;Sitting   Lower Body Bathing: Maximal assistance;Bed level   Upper Body Dressing : Sitting;Minimal assistance       Toilet Transfer: Maximal assistance;+2 for physical assistance                   Vision Baseline Vision/History: 1 Wears glasses Patient Visual Report: No change from baseline       Perception Perception: Not tested       Praxis Praxis: Not tested       Pertinent Vitals/Pain Pain Assessment Pain Assessment: Faces Faces Pain Scale: Hurts little more Pain Location: R leg Pain Descriptors / Indicators: Tender Pain Intervention(s): Premedicated before session     Extremity/Trunk Assessment Upper Extremity Assessment Upper Extremity Assessment: Generalized weakness  Lower Extremity Assessment Lower Extremity Assessment: Defer to PT evaluation   Cervical / Trunk Assessment Cervical / Trunk Assessment: Kyphotic   Communication Communication Communication: Impaired Factors Affecting Communication: Reduced clarity of speech   Cognition Arousal: Alert Behavior During Therapy: Flat affect Cognition: Difficult to assess Difficult to assess due to: Impaired communication           OT - Cognition  Comments: Difficult to understand, but appears to be intact                 Following commands: Impaired Following commands impaired: Follows one step commands with increased time     Cueing  General Comments   Cueing Techniques: Verbal cues      Exercises     Shoulder Instructions      Home Living Family/patient expects to be discharged to:: Skilled nursing facility Living Arrangements: Alone Available Help at Discharge: Family;Available PRN/intermittently (Niece can check in on him after work and more consistently on the weekends.) Type of Home: House Home Access: Stairs to enter Secretary/administrator of Steps: 1 Entrance Stairs-Rails: None Home Layout: One level     Bathroom Shower/Tub: Chief Strategy Officer: Standard Bathroom Accessibility: Yes   Home Equipment: Agricultural Consultant (2 wheels)   Additional Comments: Has a boxer and a bulldog.      Prior Functioning/Environment Prior Level of Function : Independent/Modified Independent             Mobility Comments: Ambulates without an AD in the house and using a RW in the community. ADLs Comments: ADL with assist as needed via SNF staff    OT Problem List: Decreased strength;Decreased activity tolerance;Impaired balance (sitting and/or standing);Decreased safety awareness;Decreased knowledge of use of DME or AE;Pain   OT Treatment/Interventions: Self-care/ADL training;Therapeutic activities      OT Goals(Current goals can be found in the care plan section)   Acute Rehab OT Goals Patient Stated Goal: Return home OT Goal Formulation: With patient Time For Goal Achievement: 03/07/24 Potential to Achieve Goals: Good ADL Goals Pt Will Perform Grooming: with set-up;sitting Pt Will Perform Lower Body Dressing: with supervision;sitting/lateral leans Pt Will Transfer to Toilet: with min assist;with +2 assist;bedside commode Pt/caregiver will Perform Home Exercise Program: Increased  strength;Both right and left upper extremity;With theraband;With Supervision   OT Frequency:  Min 2X/week    Co-evaluation PT/OT/SLP Co-Evaluation/Treatment: Yes Reason for Co-Treatment: Complexity of the patient's impairments (multi-system involvement)   OT goals addressed during session: ADL's and self-care      AM-PAC OT 6 Clicks Daily Activity     Outcome Measure Help from another person eating meals?: None Help from another person taking care of personal grooming?: None Help from another person toileting, which includes using toliet, bedpan, or urinal?: A Lot Help from another person bathing (including washing, rinsing, drying)?: A Lot Help from another person to put on and taking off regular upper body clothing?: A Lot Help from another person to put on and taking off regular lower body clothing?: A Lot 6 Click Score: 16   End of Session Nurse Communication: Other (comment)  Activity Tolerance: Other (comment) Patient left: in bed;with call bell/phone within reach  OT Visit Diagnosis: Unsteadiness on feet (R26.81);Muscle weakness (generalized) (M62.81);Dizziness and giddiness (R42);Pain Pain - Right/Left: Right Pain - part of body: Leg                Time: 9144-9079 OT Time Calculation (min): 25 min Charges:  OT General Charges $OT Visit:  1 Visit OT Evaluation $OT Eval Moderate Complexity: 1 Mod  02/22/2024  RP, OTR/L  Acute Rehabilitation Services  Office:  [phone]   [name] 02/22/2024, 9:31 AM

## 2024-02-22 NOTE — Progress Notes (Signed)
 " PROGRESS NOTE    [name]  FMW:[id number] DOB: 05-23-1950 DOA: 02/17/2024 PCP: Marvine Rush, MD    Brief Narrative:   74 year old with history of CVA with residual slurred speech, CKD 3 AA, chronic osteomyelitis s/p right TMA, HTN, DM 2 admitted to the hospital for generally feeling sick and having nausea/vomiting.  Workup showed concerns of sepsis with worsening right diabetic foot infection with abscess.  Dr. Harden was consulted and patient was transferred from [facility] to [facility] for surgical intervention.  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  Hospital course complicated by gram-positive bacteremia, echocardiogram unremarkable.  Plans for TEE today.    Assessment & Plan:   Sepsis secondary to right diabetic foot infection associated with abscess and osteomyelitis Gram-positive bacteremia -Orthopedic consulted.  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  Postop recommendations by their service. - ID team is following -Repeat blood cultures 1/5 - NGTD -Echocardiogram showing preserved EF 65%, grade 1 DD.  No evidence of vegetation -TEE planned today   Anemia - Combination of delusional and likely postsurgical blood loss.  Closely monitor.  Transfuse hemoglobin less than 7     Type 2 diabetes mellitus with neurological complications, uncontrolled with hyperglycemia -A1c 9.8.  Sliding scale and Accu-Chek.  Will adjust as necessary   AKI on CKD stage 3b - Baseline creatinine around 2.0, admission creatinine 4.82 with IV fluids renal function is slowly improving, creatinine 3.42 - Renal ultrasound does not show acute pathology   Gastroenteritis -Improved.  C. difficile antigen positive per toxigenic PCR is negative.  GI panel is neg.    Diffuse macular rash  -Had mild symptoms, unclear.  Improving   Essential hypertension  Sinus tachycardia -Toprol -XL IV as needed  Hypomagnesemia/hypokalemia - As needed repletion    DVT prophylaxis: heparin  Code Status:  Full  Family Communication:  Disposition: Plans for TEE  PT Follow up Recs:   Subjective: Doing okay no complaints Waiting for his TEE   Examination:  General exam: Appears calm and comfortable  Respiratory system: Clear to auscultation. Respiratory effort normal. Cardiovascular system: S1 & S2 heard, RRR. No JVD, murmurs, rubs, gallops or clicks. No pedal edema. Gastrointestinal system: Abdomen is nondistended, soft and nontender. No organomegaly or masses felt. Normal bowel sounds heard. Central nervous system: Alert and oriented. No focal neurological deficits.  Chronic slurred speech Extremities: Symmetric 5 x 5 power.  Lower extremity dressing noted Skin: Diffuse maculopapular rash appears to be better Psychiatry: Judgement and insight appear normal. Mood & affect appropriate.                Diet Orders (From admission, onward)     Start     Ordered   02/22/24 0411  Diet NPO time specified Except for: Sips with Meds  Diet effective now       Question:  Except for  Answer:  Sips with Meds   02/22/24 0410            Objective: Vitals:   02/21/24 1620 02/21/24 1940 02/22/24 0405 02/22/24 0744  BP: (!) 149/75 (!) 140/57 123/60 (!) 142/64  Pulse: 90 92 92 (!) 102  Resp: 20 17 17 18   Temp: 98.4 F (36.9 C) 98.6 F (37 C) 99.1 F (37.3 C) 98.7 F (37.1 C)  TempSrc: Oral Axillary Axillary Oral  SpO2: 100% 95% 96% 95%  Weight:      Height:        Intake/Output Summary (Last 24 hours) at 02/22/2024 1150 Last  data filed at 02/22/2024 0900 Gross per 24 hour  Intake 837.26 ml  Output 10 ml  Net 827.26 ml   Filed Weights   02/17/24 1420 02/21/24 1215  Weight: 90.2 kg 90.2 kg    Scheduled Meds:  [MAR Hold] acetaminophen   1,000 mg Oral Q6H   [MAR Hold] acidophilus  1 capsule Oral Daily   [MAR Hold] ascorbic acid   1,000 mg Oral Daily   [MAR Hold] benzonatate   100 mg Oral TID   [MAR Hold] Chlorhexidine  Gluconate Cloth  6 each Topical Daily   [MAR Hold]  docusate sodium   100 mg Oral Daily   [MAR Hold] ferrous sulfate   325 mg Oral TID WC   [MAR Hold] heparin   5,000 Units Subcutaneous Q8H   [MAR Hold] insulin  aspart  0-15 Units Subcutaneous Q4H   [MAR Hold] insulin  aspart  3 Units Subcutaneous TID WC   [MAR Hold] insulin  glargine-yfgn  10 Units Subcutaneous QHS   [MAR Hold] metoprolol  succinate  50 mg Oral Daily   [MAR Hold] mupirocin  ointment  1 Application Nasal BID   [MAR Hold] nutrition supplement (JUVEN)  1 packet Oral BID BM   [MAR Hold] sodium chloride  flush  3 mL Intravenous Q12H   [MAR Hold] Vitamin D  (Ergocalciferol )  50,000 Units Oral QHS   [MAR Hold] zinc  sulfate (50mg  elemental zinc )  220 mg Oral Daily   Continuous Infusions:  sodium chloride      sodium chloride  75 mL/hr at 02/21/24 0844   [MAR Hold] sodium chloride      [MAR Hold] DAPTOmycin  700 mg (02/21/24 1658)    Nutritional status     Body mass index is 25.53 kg/m.  Data Reviewed:   CBC: Recent Labs  Lab 02/17/24 1448 02/18/24 0513 02/19/24 0241 02/20/24 0009 02/22/24 0216  WBC 4.8 2.7* 2.7* 2.6* 2.5*  NEUTROABS 3.4  --  1.7  --   --   HGB 12.6* 9.5* 9.7* 8.7* 7.6*  HCT 38.8* 29.2* 29.6* 26.0* 22.8*  MCV 81.5 82.3 82.0 80.5 80.3  PLT 364 273 257 255 233   Basic Metabolic Panel: Recent Labs  Lab 02/18/24 0513 02/19/24 0241 02/20/24 0009 02/21/24 0515 02/22/24 0216  NA 136 136 136 137 137  K 3.7 3.4* 3.6 4.1 4.0  CL 99 103 104 106 108  CO2 26 22 22  19* 20*  GLUCOSE 128* 103* 147* 115* 132*  BUN 50* 52* 54* 49* 45*  CREATININE 4.53* 4.33* 3.80* 3.49* 3.42*  CALCIUM  7.9* 7.8* 8.1* 8.0* 7.3*  MG  --   --  1.5* 1.7 1.5*  PHOS  --   --  3.7  --   --    GFR: Estimated Creatinine Clearance: 22.7 mL/min (A) (by C-G formula based on SCr of 3.42 mg/dL (H)). Liver Function Tests: Recent Labs  Lab 02/17/24 1448  AST 19  ALT 20  ALKPHOS 77  BILITOT 0.7  PROT 7.9  ALBUMIN 3.6   No results for input(s): LIPASE, AMYLASE in the last 168  hours. No results for input(s): AMMONIA in the last 168 hours. Coagulation Profile: Recent Labs  Lab 02/17/24 1448  INR 1.1   Cardiac Enzymes: Recent Labs  Lab 02/20/24 0009  CKTOTAL 17*   BNP (last 3 results) No results for input(s): PROBNP in the last 8760 hours. HbA1C: No results for input(s): HGBA1C in the last 72 hours. CBG: Recent Labs  Lab 02/21/24 1631 02/21/24 1937 02/21/24 2317 02/22/24 0401 02/22/24 0740  GLUCAP 105* 167* 146* 117* 147*   Lipid  Profile: No results for input(s): CHOL, HDL, LDLCALC, TRIG, CHOLHDL, LDLDIRECT in the last 72 hours. Thyroid  Function Tests: No results for input(s): TSH, T4TOTAL, FREET4, T3FREE, THYROIDAB in the last 72 hours. Anemia Panel: No results for input(s): VITAMINB12, FOLATE, FERRITIN, TIBC, IRON, RETICCTPCT in the last 72 hours. Sepsis Labs: Recent Labs  Lab 02/17/24 1448 02/17/24 1627 02/18/24 0924  LATICACIDVEN 2.8* 2.5* 1.4    Recent Results (from the past 240 hours)  Resp panel by RT-PCR (RSV, Flu A&B, Covid) Anterior Nasal Swab     Status: None   Collection Time: 02/17/24  2:21 PM   Specimen: Anterior Nasal Swab  Result Value Ref Range Status   SARS Coronavirus 2 by RT PCR NEGATIVE NEGATIVE Final    Comment: (NOTE) SARS-CoV-2 target nucleic acids are NOT DETECTED.  The SARS-CoV-2 RNA is generally detectable in upper respiratory specimens during the acute phase of infection. The lowest concentration of SARS-CoV-2 viral copies this assay can detect is 138 copies/mL. A negative result does not preclude SARS-Cov-2 infection and should not be used as the sole basis for treatment or other patient management decisions. A negative result may occur with  improper specimen collection/handling, submission of specimen other than nasopharyngeal swab, presence of viral mutation(s) within the areas targeted by this assay, and inadequate number of viral copies(<138 copies/mL). A  negative result must be combined with clinical observations, patient history, and epidemiological information. The expected result is Negative.  Fact Sheet for Patients:  bloggercourse.com  Fact Sheet for Healthcare Providers:  seriousbroker.it  This test is no t yet approved or cleared by the [place]  FDA and  has been authorized for detection and/or diagnosis of SARS-CoV-2 by FDA under an Emergency Use Authorization (EUA). This EUA will remain  in effect (meaning this test can be used) for the duration of the COVID-19 declaration under Section 564(b)(1) of the Act, 21 U.S.C.section 360bbb-3(b)(1), unless the authorization is terminated  or revoked sooner.       Influenza A by PCR NEGATIVE NEGATIVE Final   Influenza B by PCR NEGATIVE NEGATIVE Final    Comment: (NOTE) The Xpert Xpress SARS-CoV-2/FLU/RSV plus assay is intended as an aid in the diagnosis of influenza from Nasopharyngeal swab specimens and should not be used as a sole basis for treatment. Nasal washings and aspirates are unacceptable for Xpert Xpress SARS-CoV-2/FLU/RSV testing.  Fact Sheet for Patients: bloggercourse.com  Fact Sheet for Healthcare Providers: seriousbroker.it  This test is not yet approved or cleared by the [place]  FDA and has been authorized for detection and/or diagnosis of SARS-CoV-2 by FDA under an Emergency Use Authorization (EUA). This EUA will remain in effect (meaning this test can be used) for the duration of the COVID-19 declaration under Section 564(b)(1) of the Act, 21 U.S.C. section 360bbb-3(b)(1), unless the authorization is terminated or revoked.     Resp Syncytial Virus by PCR NEGATIVE NEGATIVE Final    Comment: (NOTE) Fact Sheet for Patients: bloggercourse.com  Fact Sheet for Healthcare  Providers: seriousbroker.it  This test is not yet approved or cleared by the [place]  FDA and has been authorized for detection and/or diagnosis of SARS-CoV-2 by FDA under an Emergency Use Authorization (EUA). This EUA will remain in effect (meaning this test can be used) for the duration of the COVID-19 declaration under Section 564(b)(1) of the Act, 21 U.S.C. section 360bbb-3(b)(1), unless the authorization is terminated or revoked.  Performed at [facility], [address] [place], [place] [address]   Blood Culture (routine  x 2)     Status: Abnormal   Collection Time: 02/17/24  2:48 PM   Specimen: BLOOD LEFT ARM  Result Value Ref Range Status   Specimen Description   Final    BLOOD LEFT ARM BOTTLES DRAWN AEROBIC AND ANAEROBIC Performed at [facility], [address], [place], [place] [address]    Special Requests   Final    Blood Culture adequate volume Performed at [facility], [address] [place], [place] [address]    Culture  Setup Time   Final    GRAM POSITIVE COCCI ANAEROBIC BOTTLE ONLY Gram Stain Report Called to,Read Back By and Verified With: C. ANTONE ON 02/18/2024 @10 :13AM BY T. HAMER  CRITICAL RESULT CALLED TO, READ BACK BY AND VERIFIED WITH: RN DOROTHYANN ANTONE [phone] AT 1351 BY EC Performed at [facility], [address], [place], [place] [address]    Culture METHICILLIN RESISTANT STAPHYLOCOCCUS AUREUS (A)  Final   Report Status 02/20/2024 FINAL  Final   Organism ID, Bacteria METHICILLIN RESISTANT STAPHYLOCOCCUS AUREUS  Final      Susceptibility   Methicillin resistant staphylococcus aureus - MIC*    CIPROFLOXACIN  >=8 RESISTANT Resistant     ERYTHROMYCIN <=0.25 SENSITIVE Sensitive     GENTAMICIN <=0.5 SENSITIVE Sensitive     OXACILLIN >=4 RESISTANT Resistant     TETRACYCLINE <=1 SENSITIVE Sensitive     VANCOMYCIN  1 SENSITIVE Sensitive     TRIMETH /SULFA  <=10 SENSITIVE Sensitive     CLINDAMYCIN <=0.25 SENSITIVE  Sensitive     RIFAMPIN <=0.5 SENSITIVE Sensitive     Inducible Clindamycin NEGATIVE Sensitive     LINEZOLID  2 SENSITIVE Sensitive     * METHICILLIN RESISTANT STAPHYLOCOCCUS AUREUS  Blood Culture ID Panel (Reflexed)     Status: Abnormal   Collection Time: 02/17/24  2:48 PM  Result Value Ref Range Status   Enterococcus faecalis NOT DETECTED NOT DETECTED Final   Enterococcus Faecium NOT DETECTED NOT DETECTED Final   Listeria monocytogenes NOT DETECTED NOT DETECTED Final   Staphylococcus species DETECTED (A) NOT DETECTED Final    Comment: CRITICAL RESULT CALLED TO, READ BACK BY AND VERIFIED WITH: RN DOROTHYANN ANTONE [phone] AT 1351 BY EC    Staphylococcus aureus (BCID) DETECTED (A) NOT DETECTED Final    Comment: Methicillin (oxacillin)-resistant Staphylococcus aureus (MRSA). MRSA is predictably resistant to beta-lactam antibiotics (except ceftaroline). Preferred therapy is vancomycin  unless clinically contraindicated. Patient requires contact precautions if  hospitalized. CRITICAL RESULT CALLED TO, READ BACK BY AND VERIFIED WITH: RN DOROTHYANN ANTONE [phone] AT 1351 BY EC    Staphylococcus epidermidis NOT DETECTED NOT DETECTED Final   Staphylococcus lugdunensis NOT DETECTED NOT DETECTED Final   Streptococcus species NOT DETECTED NOT DETECTED Final   Streptococcus agalactiae NOT DETECTED NOT DETECTED Final   Streptococcus pneumoniae NOT DETECTED NOT DETECTED Final   Streptococcus pyogenes NOT DETECTED NOT DETECTED Final   A.calcoaceticus-baumannii NOT DETECTED NOT DETECTED Final   Bacteroides fragilis NOT DETECTED NOT DETECTED Final   Enterobacterales NOT DETECTED NOT DETECTED Final   Enterobacter cloacae complex NOT DETECTED NOT DETECTED Final   Escherichia coli NOT DETECTED NOT DETECTED Final   Klebsiella aerogenes NOT DETECTED NOT DETECTED Final   Klebsiella oxytoca NOT DETECTED NOT DETECTED Final   Klebsiella pneumoniae NOT DETECTED NOT DETECTED Final   Proteus species NOT  DETECTED NOT DETECTED Final   Salmonella species NOT DETECTED NOT DETECTED Final   Serratia marcescens NOT DETECTED NOT DETECTED Final   Haemophilus influenzae NOT DETECTED NOT DETECTED  Final   Neisseria meningitidis NOT DETECTED NOT DETECTED Final   Pseudomonas aeruginosa NOT DETECTED NOT DETECTED Final   Stenotrophomonas maltophilia NOT DETECTED NOT DETECTED Final   Candida albicans NOT DETECTED NOT DETECTED Final   Candida auris NOT DETECTED NOT DETECTED Final   Candida glabrata NOT DETECTED NOT DETECTED Final   Candida krusei NOT DETECTED NOT DETECTED Final   Candida parapsilosis NOT DETECTED NOT DETECTED Final   Candida tropicalis NOT DETECTED NOT DETECTED Final   Cryptococcus neoformans/gattii NOT DETECTED NOT DETECTED Final   Meth resistant mecA/C and MREJ DETECTED (A) NOT DETECTED Final    Comment: CRITICAL RESULT CALLED TO, READ BACK BY AND VERIFIED WITH: RN DOROTHYANN ISLAND [phone] AT 1351 BY EC Performed at [facility], [address] [place], [place] [address]   MIC (1 Drug)-     Status: None (Preliminary result)   Collection Time: 02/17/24  2:48 PM  Result Value Ref Range Status   Min Inhibitory Conc (1 Drug) PENDING  Incomplete   Source DAPTOMYCIN  MIC MRSA BLOOD CULTURE  Final    Comment: Performed at [facility], [address] [place], [place] [address]  Blood Culture (routine x 2)     Status: None   Collection Time: 02/17/24  2:50 PM   Specimen: BLOOD LEFT WRIST  Result Value Ref Range Status   Specimen Description   Final    BLOOD LEFT WRIST BOTTLES DRAWN AEROBIC AND ANAEROBIC   Special Requests Blood Culture adequate volume  Final   Culture   Final    NO GROWTH 5 DAYS Performed at [facility], [address], [place], [place] [address]    Report Status 02/22/2024 FINAL  Final  C Difficile Quick Screen w PCR reflex     Status: Abnormal   Collection Time: 02/18/24  6:35 PM   Specimen: Stool  Result Value Ref Range Status   C Diff antigen  POSITIVE (A) NEGATIVE Final   C Diff toxin NEGATIVE NEGATIVE Final   C Diff interpretation Results are indeterminate. See PCR results.  Final    Comment: Performed at [facility], [address] [place], [place] [address]  C. Diff by PCR, Reflexed     Status: None   Collection Time: 02/18/24  6:35 PM  Result Value Ref Range Status   Toxigenic C. Difficile by PCR NEGATIVE NEGATIVE Final    Comment: Patient is colonized with non toxigenic C. difficile. May not need treatment unless significant symptoms are present.   Hypervirulent Strain PRESUMPTIVE NEGATIVE PRESUMPTIVE NEGATIVE Final    Comment: Performed at [facility], [address], [place], [place] [address]  Gastrointestinal Panel by PCR , Stool     Status: None   Collection Time: 02/18/24  8:02 PM  Result Value Ref Range Status   Campylobacter species NOT DETECTED NOT DETECTED Final   Plesimonas shigelloides NOT DETECTED NOT DETECTED Final   Salmonella species NOT DETECTED NOT DETECTED Final   Yersinia enterocolitica NOT DETECTED NOT DETECTED Final   Vibrio species NOT DETECTED NOT DETECTED Final   Vibrio cholerae NOT DETECTED NOT DETECTED Final   Enteroaggregative E coli (EAEC) NOT DETECTED NOT DETECTED Final   Enteropathogenic E coli (EPEC) NOT DETECTED NOT DETECTED Final   Enterotoxigenic E coli (ETEC) NOT DETECTED NOT DETECTED Final   Shiga like toxin producing E coli (STEC) NOT DETECTED NOT DETECTED Final   Shigella/Enteroinvasive E coli (EIEC) NOT DETECTED NOT DETECTED Final   Cryptosporidium NOT DETECTED  NOT DETECTED Final   Cyclospora cayetanensis NOT DETECTED NOT DETECTED Final   Entamoeba histolytica NOT DETECTED NOT DETECTED Final   Giardia lamblia NOT DETECTED NOT DETECTED Final   Adenovirus F40/41 NOT DETECTED NOT DETECTED Final   Astrovirus NOT DETECTED NOT DETECTED Final   Norovirus GI/GII NOT DETECTED NOT DETECTED Final   Rotavirus A NOT DETECTED NOT DETECTED Final   Sapovirus (I, II, IV, and V) NOT  DETECTED NOT DETECTED Final    Comment: Performed at [facility], [address] [place], [place] [address]  Respiratory (~20 pathogens) panel by PCR     Status: None   Collection Time: 02/19/24 12:58 AM   Specimen: Nasopharyngeal Swab; Respiratory  Result Value Ref Range Status   Adenovirus NOT DETECTED NOT DETECTED Final   Coronavirus 229E NOT DETECTED NOT DETECTED Final    Comment: (NOTE) The Coronavirus on the Respiratory Panel, DOES NOT test for the novel  Coronavirus (2019 nCoV)    Coronavirus HKU1 NOT DETECTED NOT DETECTED Final   Coronavirus NL63 NOT DETECTED NOT DETECTED Final   Coronavirus OC43 NOT DETECTED NOT DETECTED Final   Metapneumovirus NOT DETECTED NOT DETECTED Final   Rhinovirus / Enterovirus NOT DETECTED NOT DETECTED Final   Influenza A NOT DETECTED NOT DETECTED Final   Influenza B NOT DETECTED NOT DETECTED Final   Parainfluenza Virus 1 NOT DETECTED NOT DETECTED Final   Parainfluenza Virus 2 NOT DETECTED NOT DETECTED Final   Parainfluenza Virus 3 NOT DETECTED NOT DETECTED Final   Parainfluenza Virus 4 NOT DETECTED NOT DETECTED Final   Respiratory Syncytial Virus NOT DETECTED NOT DETECTED Final   Bordetella pertussis NOT DETECTED NOT DETECTED Final   Bordetella Parapertussis NOT DETECTED NOT DETECTED Final   Chlamydophila pneumoniae NOT DETECTED NOT DETECTED Final   Mycoplasma pneumoniae NOT DETECTED NOT DETECTED Final    Comment: Performed at [facility], [address] [place], [place] [address]  Culture, blood (Routine X 2) w Reflex to ID Panel     Status: None (Preliminary result)   Collection Time: 02/19/24  2:39 AM   Specimen: BLOOD RIGHT HAND  Result Value Ref Range Status   Specimen Description BLOOD RIGHT HAND  Final   Special Requests   Final    BOTTLES DRAWN AEROBIC AND ANAEROBIC Blood Culture adequate volume IMMUNOCOMPROMISED   Culture   Final    NO GROWTH 3 DAYS Performed at [facility], [address], [place],  [place] [address]    Report Status PENDING  Incomplete  Culture, blood (Routine X 2) w Reflex to ID Panel     Status: None (Preliminary result)   Collection Time: 02/19/24  2:39 AM   Specimen: BLOOD RIGHT ARM  Result Value Ref Range Status   Specimen Description BLOOD RIGHT ARM  Final   Special Requests   Final    Immunocompromised BOTTLES DRAWN AEROBIC AND ANAEROBIC Blood Culture adequate volume   Culture   Final    NO GROWTH 3 DAYS Performed at [facility], [address] [place], [place] [address]    Report Status PENDING  Incomplete  Urine Culture     Status: None   Collection Time: 02/19/24  6:32 AM   Specimen: Urine, Random  Result Value Ref Range Status   Specimen Description URINE, RANDOM  Final   Special Requests URINE, CLEAN CATCH  Final   Culture   Final    NO GROWTH Performed at [facility], [address] [place], [place] [address]    Report Status 02/20/2024 FINAL  Final  Surgical pcr screen     Status: Abnormal   Collection Time: 02/20/24  2:18 PM   Specimen: Nasal Mucosa; Nasal Swab  Result Value Ref Range Status   MRSA, PCR POSITIVE (A) NEGATIVE Final    Comment: RESULT CALLED TO, READ BACK BY AND VERIFIED WITH: RN CANDIE SHUCK [phone] @ [phone] FH    Staphylococcus aureus POSITIVE (A) NEGATIVE Final    Comment: (NOTE) The Xpert SA Assay (FDA approved for NASAL specimens in patients 29 years of age and older), is one component of a comprehensive surveillance program. It is not intended to diagnose infection nor to guide or monitor treatment. Performed at [facility], [address], [place], [place] [address]          Radiology Studies: No results found.         LOS: 5 days   Time spent= 35 mins    Burgess JAYSON Dare, MD [facility]  If 7PM-7AM, please contact night-coverage  02/22/2024, 11:50 AM  "

## 2024-02-22 NOTE — Progress Notes (Signed)
 Inpatient Rehab Admissions Coordinator:   Consult received and chart reviewed.  Therapy recommendations are for SNF.  Will sign off for CIR at this time.   Reche Lowers, PT, DPT Admissions Coordinator [phone] 02/22/2024 10:19 AM

## 2024-02-22 NOTE — [place] FL2 (Signed)
 " [place]  MEDICAID FL2 LEVEL OF CARE FORM     IDENTIFICATION  Patient Name: [name] Birthdate: [birth year] Sex: male Admission Date (Current Location): 02/17/2024  [facility] and [place] Number:  Producer, Television/film/video and Address:  The [place]. [facility], [address], [place], [place] [address]      Provider Number: [id number]  Attending Physician Name and Address:  Caleen Burgess BROCKS, MD  Relative Name and Phone Number:  [facility]  Relative, Emergency Contact  [phone] (Mobile)    Current Level of Care: Hospital Recommended Level of Care: Skilled Nursing Facility Prior Approval Number:    Date Approved/Denied:   PASRR Number: [id number]  Discharge Plan: SNF    Current Diagnoses: Patient Active Problem List   Diagnosis Date Noted   MRSA bacteremia 02/18/2024   Septic arthritis of right foot 02/18/2024   Gastroenteritis 02/17/2024   Diabetic foot infection (HCC) 02/17/2024   Malnutrition of moderate degree 01/24/2024   Depression with anxiety 01/16/2024   Acute ischemic stroke (HCC) 05/03/2023   H/O Hemorrhagic stroke 05/03/2023   Chronic osteomyelitis (HCC) 05/03/2023   Hyperglycemia 03/04/2023   Osteomyelitis of right foot (HCC) 03/01/2023   Lactic acidosis 03/01/2023   History of transmetatarsal amputation of right foot (HCC) 04/06/2022   Cutaneous abscess of right foot 02/04/2022   Osteomyelitis of great toe of left foot (HCC)    Hyperlipidemia 11/30/2021   Bedbug bite with infection 11/30/2021   Cellulitis of right foot 11/12/2021   AKI (acute kidney injury) 11/12/2021   Sepsis (HCC) 11/11/2021   Amputated great toe of right foot 02/20/2019   Stage 3a chronic kidney disease (HCC) 02/13/2019   Acute osteomyelitis (HCC)    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/30/2019   Fatty liver 01/09/2019   Thrombocytopenia, unspecified 05/14/2013   Other specified abnormal findings of blood chemistry 05/14/2013   Colon adenoma 09/13/2012    Anxiety 09/06/2012   Intellectual disability 09/06/2012   Chest pain 09/06/2012   GERD (gastroesophageal reflux disease) 03/08/2012   Gout 09/06/2011   Hypertension 03/08/2011   Chronic kidney disease, stage III (moderate) (HCC) 11/30/2009   Urinary tract infection 11/30/2009    Orientation RESPIRATION BLADDER Height & Weight     Self, Situation, Place  Normal Incontinent Weight: 198 lb 13.7 oz (90.2 kg) Height:  6' 2 (188 cm)  BEHAVIORAL SYMPTOMS/MOOD NEUROLOGICAL BOWEL NUTRITION STATUS      Continent Diet (see d/c summary)  AMBULATORY STATUS COMMUNICATION OF NEEDS Skin   Extensive Assist Verbally Surgical wounds (incision left leg; wound right foot; prevena woundvac)                       Personal Care Assistance Level of Assistance  Bathing, Feeding, Dressing Bathing Assistance: Maximum assistance Feeding assistance: Independent Dressing Assistance: Limited assistance     Functional Limitations Info  Sight, Hearing, Speech Sight Info: Impaired Hearing Info: Adequate Speech Info: Impaired    SPECIAL CARE FACTORS FREQUENCY  OT (By licensed OT), PT (By licensed PT)     PT Frequency: 5x/week OT Frequency: 5x/week            Contractures Contractures Info: Not present    Additional Factors Info  Code Status, Allergies Code Status Info: full code Allergies Info: Ivp Dye (iodinated Contrast Media), penicillins, Tetanus Toxoid-containing Vaccines           Current Medications (02/22/2024):  This is the current hospital active medication list Current Facility-Administered Medications  Medication Dose Route Frequency Provider Last Rate Last Admin   0.9 %  sodium chloride  infusion   Intravenous Continuous Gerome Maurilio HERO, PA-C 75 mL/hr at 02/22/24 1214 Continued from Pre-op at 02/22/24 1214   0.9 %  sodium chloride  infusion  250 mL Intravenous PRN Gerome Maurilio M, PA-C       acetaminophen  (TYLENOL ) tablet 1,000 mg  1,000 mg Oral Q6H Gerome Maurilio M, PA-C    1,000 mg at 02/22/24 0621   acetaminophen  (TYLENOL ) tablet 325-650 mg  325-650 mg Oral Q6H PRN Gerome Maurilio M, PA-C       acidophilus (RISAQUAD) capsule 1 capsule  1 capsule Oral Daily Gerome Maurilio M, PA-C   1 capsule at 02/22/24 9170   ascorbic acid  (VITAMIN C ) tablet 1,000 mg  1,000 mg Oral Daily Gerome Maurilio M, PA-C   1,000 mg at 02/21/24 1653   benzonatate  (TESSALON ) capsule 100 mg  100 mg Oral TID Gerome Maurilio HERO, PA-C   100 mg at 02/22/24 9170   Chlorhexidine  Gluconate Cloth 2 % PADS 6 each  6 each Topical Daily Gerome Maurilio HERO, PA-C   6 each at 02/22/24 0840   DAPTOmycin  (CUBICIN ) IVPB 700 mg/100mL premix  8 mg/kg Intravenous Q48H Merilee Linsey I, RPH 200 mL/hr at 02/21/24 1658 700 mg at 02/21/24 1658   docusate sodium  (COLACE) capsule 100 mg  100 mg Oral Daily Gerome Maurilio M, PA-C       ferrous sulfate  tablet 325 mg  325 mg Oral TID WC Gerome Maurilio M, PA-C   325 mg at 02/21/24 1652   glucagon  (human recombinant) (GLUCAGEN) injection 1 mg  1 mg Intravenous PRN Gerome Maurilio M, PA-C       guaiFENesin  (ROBITUSSIN) 100 MG/5ML liquid 5 mL  5 mL Oral Q4H PRN Gerome Maurilio M, PA-C   5 mL at 02/21/24 2217   heparin  injection 5,000 Units  5,000 Units Subcutaneous Q8H Gerome Maurilio HERO, PA-C   5,000 Units at 02/22/24 9377   hydrALAZINE  (APRESOLINE ) injection 10 mg  10 mg Intravenous Q4H PRN Gerome Maurilio M, PA-C       hydrALAZINE  (APRESOLINE ) injection 5 mg  5 mg Intravenous Q20 Min PRN Gerome Maurilio M, PA-C       HYDROmorphone  (DILAUDID ) injection 0.5-1 mg  0.5-1 mg Intravenous Q4H PRN Gerome Maurilio M, PA-C   1 mg at 02/22/24 9168   insulin  aspart (novoLOG ) injection 0-15 Units  0-15 Units Subcutaneous Q4H Gerome Maurilio HERO, PA-C   2 Units at 02/22/24 9170   insulin  aspart (novoLOG ) injection 3 Units  3 Units Subcutaneous TID WC Gerome Maurilio HERO, PA-C   3 Units at 02/20/24 1618   insulin  glargine-yfgn (SEMGLEE ) injection 10 Units  10 Units Subcutaneous QHS Gerome Maurilio HERO, PA-C   10 Units at  02/21/24 2216   ipratropium-albuterol  (DUONEB) 0.5-2.5 (3) MG/3ML nebulizer solution 3 mL  3 mL Nebulization Q4H PRN Gerome Maurilio M, PA-C       loperamide  (IMODIUM ) capsule 2 mg  2 mg Oral PRN Gerome Maurilio HERO, PA-C   2 mg at 02/21/24 1652   metoprolol  succinate (TOPROL -XL) 24 hr tablet 50 mg  50 mg Oral Daily Gerome Maurilio M, PA-C   50 mg at 02/22/24 9170   metoprolol  tartrate (LOPRESSOR ) injection 5 mg  5 mg Intravenous Q4H PRN Gerome Maurilio M, PA-C       mupirocin  ointment (BACTROBAN ) 2 % 1 Application  1 Application Nasal BID Gerome Maurilio HERO, PA-C   1 Application  at 02/22/24 0841   nutrition supplement (JUVEN) (JUVEN) powder packet 1 packet  1 packet Oral BID BM Gerome Herring M, PA-C       ondansetron  (ZOFRAN ) injection 4 mg  4 mg Intravenous Q6H PRN Gerome Herring M, PA-C   4 mg at 02/22/24 9082   oxyCODONE  (Oxy IR/ROXICODONE ) immediate release tablet 10-15 mg  10-15 mg Oral Q4H PRN Gerome Herring M, PA-C       oxyCODONE  (Oxy IR/ROXICODONE ) immediate release tablet 5-10 mg  5-10 mg Oral Q4H PRN Gerome Herring M, PA-C       phenol (CHLORASEPTIC) mouth spray 1 spray  1 spray Mouth/Throat PRN Gerome Herring M, PA-C       polyethylene glycol (MIRALAX  / GLYCOLAX ) packet 17 g  17 g Oral Daily PRN Gerome Herring M, PA-C       potassium chloride  SA (KLOR-CON  M) CR tablet 40-60 mEq  40-60 mEq Oral Daily PRN Gerome Herring M, PA-C       senna-docusate (Senokot-S) tablet 1 tablet  1 tablet Oral QHS PRN Gerome Herring HERO, PA-C       sodium chloride  flush (NS) 0.9 % injection 3 mL  3 mL Intravenous Q12H Gerome Herring M, PA-C   3 mL at 02/22/24 9157   sodium chloride  flush (NS) 0.9 % injection 3 mL  3 mL Intravenous PRN Gerome Herring M, PA-C       Vitamin D  (Ergocalciferol ) (DRISDOL ) 1.25 MG (50000 UNIT) capsule 50,000 Units  50,000 Units Oral QHS Gerome Herring HERO, PA-C   50,000 Units at 02/21/24 2217   zinc  sulfate (50mg  elemental zinc ) capsule 220 mg  220 mg Oral Daily Gerome Herring M, PA-C   220 mg at 02/21/24  1652     Discharge Medications: Please see discharge summary for a list of discharge medications.  Relevant Imaging Results:  Relevant Lab Results:   Additional Information SSN: [id number]  Luann SHAUNNA Cumming, LCSW     "

## 2024-02-22 NOTE — Care Management Important Message (Signed)
 Important Message  Patient Details  Name: [name] MRN: [id number] Date of Birth: Nov 21, 1950   Important Message Given:  Yes - Medicare IM     [name] 02/22/2024, 12:09 PM

## 2024-02-22 NOTE — Anesthesia Postprocedure Evaluation (Addendum)
"   Anesthesia Post Note  Patient: [name]  Procedure(s) Performed: TRANSESOPHAGEAL ECHOCARDIOGRAM     Patient location during evaluation: PACU Anesthesia Type: MAC Level of consciousness: awake and alert Pain management: pain level controlled Vital Signs Assessment: post-procedure vital signs reviewed and stable Respiratory status: spontaneous breathing, nonlabored ventilation, respiratory function stable and patient connected to nasal cannula oxygen Cardiovascular status: blood pressure returned to baseline and stable Postop Assessment: no apparent nausea or vomiting Anesthetic complications: no   No notable events documented.  Last Vitals:  Vitals:   02/22/24 1253 02/22/24 1321  BP: (!) 126/49 136/71  Pulse: 89 91  Resp:  18  Temp:  36.8 C  SpO2: 94% 95%    Last Pain:  Vitals:   02/22/24 1321  TempSrc: Oral  PainSc:                  [name] Deardra Hinkley      "

## 2024-02-22 NOTE — Transfer of Care (Signed)
 Immediate Anesthesia Transfer of Care Note  Patient: [name]  Procedure(s) Performed: TRANSESOPHAGEAL ECHOCARDIOGRAM  Patient Location: PACU  Anesthesia Type:MAC  Level of Consciousness: awake  Airway & Oxygen Therapy: Patient Spontanous Breathing  Post-op Assessment: Report given to RN and Post -op Vital signs reviewed and stable  Post vital signs: Reviewed  Last Vitals:  Vitals Value Taken Time  BP 136/71 02/22/24 13:21  Temp 36.8 C 02/22/24 13:21  Pulse 91 02/22/24 13:21  Resp 18 02/22/24 13:21  SpO2 95 % 02/22/24 13:21    Last Pain:  Vitals:   02/22/24 1321  TempSrc: Oral  PainSc:       Patients Stated Pain Goal: 0 (02/21/24 2036)  Complications: No notable events documented.

## 2024-02-22 NOTE — TOC Initial Note (Signed)
 Transition of Care [facility]) - Initial/Assessment Note    Patient Details  Name: [name] MRN: [id number] Date of Birth: September 05, 1950  [facility] of [facility]) CM/SW Contact:    Luann SHAUNNA Cumming, LCSW Phone Number: 02/22/2024, 2:34 PM  Clinical Narrative:                  CSW met with pt to discuss SNF recommendations. Pt is agreeable to SNF w/u. States he was recently at [facility] and would like to go there again as it is close to his home. Fl2 completed and bed requests sent in hub.   Expected Discharge Plan: Skilled Nursing Facility Barriers to Discharge: Continued Medical Work up, SNF Pending bed offer, Insurance Authorization   Patient Goals and CMS Choice Patient states their goals for this hospitalization and ongoing recovery are:: Strengthening and return home ASAP          Expected Discharge Plan and Services In-house Referral: Clinical Social Work Discharge Planning Services: CM Consult   Living arrangements for the past 2 months: Single Family Home                                      Prior Living Arrangements/Services Living arrangements for the past 2 months: Single Family Home Lives with:: Self Patient language and need for interpreter reviewed:: Yes Do you feel safe going back to the place where you live?: Yes      Need for Family Participation in Patient Care: No (Comment) Care giver support system in place?: No (comment)   Criminal Activity/Legal Involvement Pertinent to Current Situation/Hospitalization: No - Comment as needed  Activities of Daily Living   ADL Screening (condition at time of admission) Independently performs ADLs?: No Does the patient have a NEW difficulty with bathing/dressing/toileting/self-feeding that is expected to last >3 days?: Yes (Initiates electronic notice to provider for possible OT consult) Does the patient have a NEW difficulty with getting in/out of bed, walking, or climbing stairs that is expected to last >3  days?: Yes (Initiates electronic notice to provider for possible PT consult) Does the patient have a NEW difficulty with communication that is expected to last >3 days?: No Is the patient deaf or have difficulty hearing?: No Does the patient have difficulty seeing, even when wearing glasses/contacts?: No Does the patient have difficulty concentrating, remembering, or making decisions?: No  Permission Sought/Granted Permission sought to share information with : Case Manager, Magazine Features Editor, Family Supports Permission granted to share information with : Yes, Verbal Permission Granted              Emotional Assessment Appearance:: Appears stated age Attitude/Demeanor/Rapport: Lethargic Affect (typically observed): Appropriate Orientation: : Oriented to Self, Oriented to Place, Oriented to Situation Alcohol / Substance Use: Not Applicable Psych Involvement: No (comment)  Admission diagnosis:  AKI (acute kidney injury) [N17.9] Sepsis with acute renal failure without septic shock, due to unspecified organism, unspecified acute renal failure type (HCC) [A41.9, R65.20, N17.9] Diabetic foot infection (HCC) [Z88.371, L08.9] Patient Active Problem List   Diagnosis Date Noted   MRSA bacteremia 02/18/2024   Septic arthritis of right foot 02/18/2024   Gastroenteritis 02/17/2024   Diabetic foot infection (HCC) 02/17/2024   Malnutrition of moderate degree 01/24/2024   Depression with anxiety 01/16/2024   Acute ischemic stroke (HCC) 05/03/2023   H/O Hemorrhagic stroke 05/03/2023   Chronic osteomyelitis (HCC) 05/03/2023   Hyperglycemia 03/04/2023  Osteomyelitis of right foot (HCC) 03/01/2023   Lactic acidosis 03/01/2023   History of transmetatarsal amputation of right foot (HCC) 04/06/2022   Cutaneous abscess of right foot 02/04/2022   Osteomyelitis of great toe of left foot (HCC)    Hyperlipidemia 11/30/2021   Bedbug bite with infection 11/30/2021    Class: Acute    Cellulitis of right foot 11/12/2021   AKI (acute kidney injury) 11/12/2021   Sepsis (HCC) 11/11/2021   Amputated great toe of right foot 02/20/2019   Stage 3a chronic kidney disease (HCC) 02/13/2019   Acute osteomyelitis (HCC)    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/30/2019   Fatty liver 01/09/2019   Thrombocytopenia, unspecified 05/14/2013   Other specified abnormal findings of blood chemistry 05/14/2013   Colon adenoma 09/13/2012   Anxiety 09/06/2012   Intellectual disability 09/06/2012   Chest pain 09/06/2012   GERD (gastroesophageal reflux disease) 03/08/2012   Gout 09/06/2011   Hypertension 03/08/2011   Chronic kidney disease, stage III (moderate) (HCC) 11/30/2009   Urinary tract infection 11/30/2009   PCP:  Marvine Rush, MD Pharmacy:   [facility] [phone] - [place], [place] - [address] AT [facility] [address] [place] [address] Phone: [phone] Fax: [phone]     Social Drivers of Health (SDOH) Social History: SDOH Screenings   Food Insecurity: No Food Insecurity (02/17/2024)  Housing: Low Risk (02/17/2024)  Transportation Needs: No Transportation Needs (02/17/2024)  Utilities: Not At Risk (02/17/2024)  Financial Resource Strain: Low Risk (12/13/2021)  Social Connections: Socially Isolated (02/17/2024)  Tobacco Use: Low Risk (02/21/2024)   SDOH Interventions:     Readmission Risk Interventions    11/30/2021    2:51 PM  Readmission Risk Prevention Plan  Transportation Screening Complete  HRI or Home Care Consult Complete  Social Work Consult for Recovery Care Planning/Counseling Complete  Palliative Care Screening Not Applicable  Medication Review Oceanographer) Complete

## 2024-02-22 NOTE — Interval H&P Note (Signed)
 History and Physical Interval Note:  02/22/2024 11:52 AM  [name]  has presented today for surgery, with the diagnosis of Bacteremia.  The various methods of treatment have been discussed with the patient and family. After consideration of risks, benefits and other options for treatment, the patient has consented to  Procedures: TRANSESOPHAGEAL ECHOCARDIOGRAM (N/A) as a surgical intervention.  The patient's history has been reviewed, patient examined, no change in status, stable for surgery.  I have reviewed the patient's chart and labs.  Questions were answered to the patient's satisfaction.     Lonni LITTIE Nanas

## 2024-02-22 NOTE — CV Procedure (Signed)
" ° ° ° °  TRANSESOPHAGEAL ECHOCARDIOGRAM   NAME:  [name]   MRN: [id number] DOB:  1950-04-07   ADMIT DATE: 02/17/2024  INDICATIONS: Bacteremia  PROCEDURE:   Informed consent was obtained prior to the procedure. The risks, benefits and alternatives for the procedure were discussed and the patient comprehended these risks.  Risks include, but are not limited to, cough, sore throat, vomiting, nausea, somnolence, esophageal and stomach trauma or perforation, bleeding, low blood pressure, aspiration, pneumonia, infection, trauma to the teeth and death.    After a procedural time-out, the oropharynx was anesthetized and the patient was sedated by the anesthesia service. The transesophageal probe was inserted in the esophagus and stomach without difficulty and multiple views were obtained. Anesthesia was monitored by Duwaine Daring, CRNA.    COMPLICATIONS:    There were no immediate complications.  FINDINGS:  No vegetation seen  Lonni Nanas MD [facility]  [facility]    "

## 2024-02-23 ENCOUNTER — Inpatient Hospital Stay (HOSPITAL_COMMUNITY)

## 2024-02-23 DIAGNOSIS — K219 Gastro-esophageal reflux disease without esophagitis: Secondary | ICD-10-CM

## 2024-02-23 DIAGNOSIS — L03115 Cellulitis of right lower limb: Secondary | ICD-10-CM

## 2024-02-23 DIAGNOSIS — E119 Type 2 diabetes mellitus without complications: Secondary | ICD-10-CM | POA: Diagnosis not present

## 2024-02-23 DIAGNOSIS — K529 Noninfective gastroenteritis and colitis, unspecified: Secondary | ICD-10-CM | POA: Diagnosis not present

## 2024-02-23 DIAGNOSIS — I10 Essential (primary) hypertension: Secondary | ICD-10-CM | POA: Diagnosis not present

## 2024-02-23 DIAGNOSIS — E1121 Type 2 diabetes mellitus with diabetic nephropathy: Secondary | ICD-10-CM | POA: Diagnosis not present

## 2024-02-23 DIAGNOSIS — L089 Local infection of the skin and subcutaneous tissue, unspecified: Secondary | ICD-10-CM | POA: Diagnosis not present

## 2024-02-23 DIAGNOSIS — R7881 Bacteremia: Secondary | ICD-10-CM | POA: Diagnosis not present

## 2024-02-23 DIAGNOSIS — Z89511 Acquired absence of right leg below knee: Secondary | ICD-10-CM

## 2024-02-23 DIAGNOSIS — N1832 Chronic kidney disease, stage 3b: Secondary | ICD-10-CM | POA: Diagnosis not present

## 2024-02-23 DIAGNOSIS — B9562 Methicillin resistant Staphylococcus aureus infection as the cause of diseases classified elsewhere: Secondary | ICD-10-CM | POA: Diagnosis not present

## 2024-02-23 DIAGNOSIS — E11628 Type 2 diabetes mellitus with other skin complications: Secondary | ICD-10-CM | POA: Diagnosis not present

## 2024-02-23 LAB — CBC
HCT: 22.1 % — ABNORMAL LOW (ref 39.0–52.0)
HCT: 23.1 % — ABNORMAL LOW (ref 39.0–52.0)
Hemoglobin: 7.3 g/dL — ABNORMAL LOW (ref 13.0–17.0)
Hemoglobin: 7.5 g/dL — ABNORMAL LOW (ref 13.0–17.0)
MCH: 27 pg (ref 26.0–34.0)
MCH: 27.1 pg (ref 26.0–34.0)
MCHC: 32.5 g/dL (ref 30.0–36.0)
MCHC: 33 g/dL (ref 30.0–36.0)
MCV: 82.2 fL (ref 80.0–100.0)
MCV: 83.1 fL (ref 80.0–100.0)
Platelets: 196 K/uL (ref 150–400)
Platelets: 202 K/uL (ref 150–400)
RBC: 2.69 MIL/uL — ABNORMAL LOW (ref 4.22–5.81)
RBC: 2.78 MIL/uL — ABNORMAL LOW (ref 4.22–5.81)
RDW: 16 % — ABNORMAL HIGH (ref 11.5–15.5)
RDW: 16.2 % — ABNORMAL HIGH (ref 11.5–15.5)
WBC: 2.9 K/uL — ABNORMAL LOW (ref 4.0–10.5)
WBC: 3 K/uL — ABNORMAL LOW (ref 4.0–10.5)
nRBC: 0 % (ref 0.0–0.2)
nRBC: 0 % (ref 0.0–0.2)

## 2024-02-23 LAB — BASIC METABOLIC PANEL WITH GFR
Anion gap: 9 (ref 5–15)
BUN: 39 mg/dL — ABNORMAL HIGH (ref 8–23)
CO2: 21 mmol/L — ABNORMAL LOW (ref 22–32)
Calcium: 7.1 mg/dL — ABNORMAL LOW (ref 8.9–10.3)
Chloride: 108 mmol/L (ref 98–111)
Creatinine, Ser: 3.46 mg/dL — ABNORMAL HIGH (ref 0.61–1.24)
GFR, Estimated: 18 mL/min — ABNORMAL LOW
Glucose, Bld: 179 mg/dL — ABNORMAL HIGH (ref 70–99)
Potassium: 3.8 mmol/L (ref 3.5–5.1)
Sodium: 137 mmol/L (ref 135–145)

## 2024-02-23 LAB — GLUCOSE, CAPILLARY
Glucose-Capillary: 139 mg/dL — ABNORMAL HIGH (ref 70–99)
Glucose-Capillary: 146 mg/dL — ABNORMAL HIGH (ref 70–99)
Glucose-Capillary: 137 mg/dL — ABNORMAL HIGH (ref 70–99)
Glucose-Capillary: 187 mg/dL — ABNORMAL HIGH (ref 70–99)

## 2024-02-23 LAB — MAGNESIUM: Magnesium: 1.5 mg/dL — ABNORMAL LOW (ref 1.7–2.4)

## 2024-02-23 LAB — VITAMIN B12: Vitamin B-12: 536 pg/mL (ref 180–914)

## 2024-02-23 MED ORDER — LINEZOLID 600 MG PO TABS
600.0000 mg | ORAL_TABLET | Freq: Two times a day (BID) | ORAL | Status: DC
  Administered 2024-02-26 – 2024-02-28 (×5): 600 mg via ORAL
  Filled 2024-02-23 (×6): qty 1

## 2024-02-23 MED ORDER — IPRATROPIUM-ALBUTEROL 0.5-2.5 (3) MG/3ML IN SOLN
3.0000 mL | Freq: Four times a day (QID) | RESPIRATORY_TRACT | Status: DC
  Filled 2024-02-23: qty 3

## 2024-02-23 MED ORDER — FUROSEMIDE 10 MG/ML IJ SOLN
40.0000 mg | Freq: Once | INTRAMUSCULAR | Status: AC
  Administered 2024-02-23: 40 mg via INTRAVENOUS
  Filled 2024-02-23: qty 4

## 2024-02-23 MED ORDER — MAGNESIUM SULFATE 2 GM/50ML IV SOLN
2.0000 g | Freq: Once | INTRAVENOUS | Status: AC
  Administered 2024-02-23: 2 g via INTRAVENOUS
  Filled 2024-02-23: qty 50

## 2024-02-23 NOTE — Progress Notes (Signed)
 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, admitted 1/3 for acute onset n/v found to have mrsa bsi in setting of mri finding om of calcaneous/cuneiform left foot, pending bka   He denies other localizing bone/joint pain or headache, visual changes He has no orif hardware or cardiac device   He was afebrile on presentation and has relative leukopenia   1/3 bcx mrsas 1/5 repeat bcx negative He has no chest sx but there was an rvp done that was negative   S/p right bka 1/07   Tee no vegetation   Plan 4 weeks abx therapy for community onset mrsa bsi   PLAN: As patient will remain in over the weekend, please keep on iv daptomycin  Timing of abx is from 02/19/2024 On discharge on 02/26/24 can transition to linezolid  to finish 3 more weeks on 03/18/2024 Id f/u 03/07/24 Maintain contact isolation precaution Will sign off Discuss with primary team   Principal Problem:   Diabetic foot infection (HCC) Active Problems:   Hypertension   Gout   GERD (gastroesophageal reflux disease)   Anxiety   Intellectual disability   Type 2 diabetes with nephropathy (HCC)   Amputated great toe of right foot   Chronic kidney disease, stage III (moderate) (HCC)   Stage 3a chronic kidney disease (HCC)   Sepsis (HCC)   Cellulitis of right foot   AKI (acute kidney injury)   Cutaneous abscess of right foot   History of transmetatarsal amputation of right foot (HCC)   Osteomyelitis of right foot (HCC)   Lactic acidosis   H/O Hemorrhagic stroke   Gastroenteritis   MRSA bacteremia   Septic arthritis of right foot   Allergies[1]  Scheduled Meds:  acidophilus  1 capsule Oral Daily   ascorbic acid   1,000 mg Oral Daily   benzonatate   100 mg Oral TID   Chlorhexidine  Gluconate Cloth  6 each Topical Daily   docusate sodium   100 mg  Oral Daily   ferrous sulfate   325 mg Oral TID WC   heparin   5,000 Units Subcutaneous Q8H   insulin  aspart  0-15 Units Subcutaneous Q4H   insulin  aspart  3 Units Subcutaneous TID WC   insulin  glargine-yfgn  10 Units Subcutaneous QHS   metoprolol  succinate  50 mg Oral Daily   mupirocin  ointment  1 Application Nasal BID   nutrition supplement (JUVEN)  1 packet Oral BID BM   sodium chloride  flush  3 mL Intravenous Q12H   Vitamin D  (Ergocalciferol )  50,000 Units Oral QHS   zinc  sulfate (50mg  elemental zinc )  220 mg Oral Daily   Continuous Infusions:  DAPTOmycin  700 mg (02/21/24 1658)   PRN Meds:.acetaminophen , glucagon  (human recombinant), guaiFENesin , hydrALAZINE , hydrALAZINE , HYDROmorphone  (DILAUDID ) injection, ipratropium-albuterol , loperamide , metoprolol  tartrate, ondansetron , oxyCODONE , oxyCODONE , phenol, polyethylene glycol, potassium chloride , senna-docusate, sodium chloride  flush   SUBJECTIVE: Pain, but controlled in rle No other complaint   Review of Systems: ROS All other ROS was negative, except mentioned above     OBJECTIVE: Vitals:   02/22/24 2008 02/23/24 0247 02/23/24 0805 02/23/24 1100  BP: (!) 142/65 (!) 149/67 (!) 155/73   Pulse: 96 (!) 101 98   Resp: 19 19    Temp: 99.7 F (37.6 C) 100.3 F (37.9 C) (!) 100.6 F (38.1 C) 98.9 F (37.2  C)  TempSrc:    Oral  SpO2: 95% 95% 95%   Weight:      Height:       Body mass index is 25.53 kg/m.  Physical Exam  General/constitutional: no distress, pleasant HEENT: Normocephalic, PER, Conj Clear, EOMI, Oropharynx clear Neck supple CV: rrr no mrg Lungs: clear to auscultation, normal respiratory effort Abd: Soft, Nontender Ext: no edema Skin: No Rash Neuro: nonfocal MSK: right bka stump in dressing and protective splint  Lab Results Lab Results  Component Value Date   WBC 2.9 (L) 02/23/2024   HGB 7.3 (L) 02/23/2024   HCT 22.1 (L) 02/23/2024   MCV 82.2 02/23/2024   PLT 202 02/23/2024    Lab Results   Component Value Date   CREATININE 3.46 (H) 02/23/2024   BUN 39 (H) 02/23/2024   NA 137 02/23/2024   K 3.8 02/23/2024   CL 108 02/23/2024   CO2 21 (L) 02/23/2024    Lab Results  Component Value Date   ALT 20 02/17/2024   AST 19 02/17/2024   ALKPHOS 77 02/17/2024   BILITOT 0.7 02/17/2024      Microbiology: Recent Results (from the past 240 hours)  Resp panel by RT-PCR (RSV, Flu A&B, Covid) Anterior Nasal Swab     Status: None   Collection Time: 02/17/24  2:21 PM   Specimen: Anterior Nasal Swab  Result Value Ref Range Status   SARS Coronavirus 2 by RT PCR NEGATIVE NEGATIVE Final    Comment: (NOTE) SARS-CoV-2 target nucleic acids are NOT DETECTED.  The SARS-CoV-2 RNA is generally detectable in upper respiratory specimens during the acute phase of infection. The lowest concentration of SARS-CoV-2 viral copies this assay can detect is 138 copies/mL. A negative result does not preclude SARS-Cov-2 infection and should not be used as the sole basis for treatment or other patient management decisions. A negative result may occur with  improper specimen collection/handling, submission of specimen other than nasopharyngeal swab, presence of viral mutation(s) within the areas targeted by this assay, and inadequate number of viral copies(<138 copies/mL). A negative result must be combined with clinical observations, patient history, and epidemiological information. The expected result is Negative.  Fact Sheet for Patients:  bloggercourse.com  Fact Sheet for Healthcare Providers:  seriousbroker.it  This test is no t yet approved or cleared by the [place]  FDA and  has been authorized for detection and/or diagnosis of SARS-CoV-2 by FDA under an Emergency Use Authorization (EUA). This EUA will remain  in effect (meaning this test can be used) for the duration of the COVID-19 declaration under Section 564(b)(1) of the Act,  21 U.S.C.section 360bbb-3(b)(1), unless the authorization is terminated  or revoked sooner.       Influenza A by PCR NEGATIVE NEGATIVE Final   Influenza B by PCR NEGATIVE NEGATIVE Final    Comment: (NOTE) The Xpert Xpress SARS-CoV-2/FLU/RSV plus assay is intended as an aid in the diagnosis of influenza from Nasopharyngeal swab specimens and should not be used as a sole basis for treatment. Nasal washings and aspirates are unacceptable for Xpert Xpress SARS-CoV-2/FLU/RSV testing.  Fact Sheet for Patients: bloggercourse.com  Fact Sheet for Healthcare Providers: seriousbroker.it  This test is not yet approved or cleared by the [place]  FDA and has been authorized for detection and/or diagnosis of SARS-CoV-2 by FDA under an Emergency Use Authorization (EUA). This EUA will remain in effect (meaning this test can be used) for the duration of the COVID-19 declaration under Section 564(b)(1) of the  Act, 21 U.S.C. section 360bbb-3(b)(1), unless the authorization is terminated or revoked.     Resp Syncytial Virus by PCR NEGATIVE NEGATIVE Final    Comment: (NOTE) Fact Sheet for Patients: bloggercourse.com  Fact Sheet for Healthcare Providers: seriousbroker.it  This test is not yet approved or cleared by the [place]  FDA and has been authorized for detection and/or diagnosis of SARS-CoV-2 by FDA under an Emergency Use Authorization (EUA). This EUA will remain in effect (meaning this test can be used) for the duration of the COVID-19 declaration under Section 564(b)(1) of the Act, 21 U.S.C. section 360bbb-3(b)(1), unless the authorization is terminated or revoked.  Performed at [facility], [address] [place], [place] [address]   Blood Culture (routine x 2)     Status: Abnormal   Collection Time: 02/17/24  2:48 PM   Specimen: BLOOD LEFT ARM  Result Value Ref Range  Status   Specimen Description   Final    BLOOD LEFT ARM BOTTLES DRAWN AEROBIC AND ANAEROBIC Performed at [facility], [address] [place], [place] [address]    Special Requests   Final    Blood Culture adequate volume Performed at [facility], [address] [place], [place] [address]    Culture  Setup Time   Final    GRAM POSITIVE COCCI ANAEROBIC BOTTLE ONLY Gram Stain Report Called to,Read Back By and Verified With: C. ANTONE ON 02/18/2024 @10 :13AM BY T. HAMER  CRITICAL RESULT CALLED TO, READ BACK BY AND VERIFIED WITH: RN DOROTHYANN ANTONE [id number] AT 1351 BY EC Performed at [facility], [address] [place], [place] [address]    Culture METHICILLIN RESISTANT STAPHYLOCOCCUS AUREUS (A)  Final   Report Status 02/20/2024 FINAL  Final   Organism ID, Bacteria METHICILLIN RESISTANT STAPHYLOCOCCUS AUREUS  Final      Susceptibility   Methicillin resistant staphylococcus aureus - MIC*    CIPROFLOXACIN  >=8 RESISTANT Resistant     ERYTHROMYCIN <=0.25 SENSITIVE Sensitive     GENTAMICIN <=0.5 SENSITIVE Sensitive     OXACILLIN >=4 RESISTANT Resistant     TETRACYCLINE <=1 SENSITIVE Sensitive     VANCOMYCIN  1 SENSITIVE Sensitive     TRIMETH /SULFA  <=10 SENSITIVE Sensitive     CLINDAMYCIN <=0.25 SENSITIVE Sensitive     RIFAMPIN <=0.5 SENSITIVE Sensitive     Inducible Clindamycin NEGATIVE Sensitive     LINEZOLID  2 SENSITIVE Sensitive     * METHICILLIN RESISTANT STAPHYLOCOCCUS AUREUS  Blood Culture ID Panel (Reflexed)     Status: Abnormal   Collection Time: 02/17/24  2:48 PM  Result Value Ref Range Status   Enterococcus faecalis NOT DETECTED NOT DETECTED Final   Enterococcus Faecium NOT DETECTED NOT DETECTED Final   Listeria monocytogenes NOT DETECTED NOT DETECTED Final   Staphylococcus species DETECTED (A) NOT DETECTED Final    Comment: CRITICAL RESULT CALLED TO, READ BACK BY AND VERIFIED WITH: RN DOROTHYANN ANTONE [id number] AT 1351 BY EC    Staphylococcus aureus (BCID)  DETECTED (A) NOT DETECTED Final    Comment: Methicillin (oxacillin)-resistant Staphylococcus aureus (MRSA). MRSA is predictably resistant to beta-lactam antibiotics (except ceftaroline). Preferred therapy is vancomycin  unless clinically contraindicated. Patient requires contact precautions if  hospitalized. CRITICAL RESULT CALLED TO, READ BACK BY AND VERIFIED WITH: RN DOROTHYANN ANTONE [id number] AT 1351 BY EC    Staphylococcus epidermidis NOT DETECTED NOT DETECTED Final   Staphylococcus lugdunensis NOT DETECTED NOT DETECTED Final   Streptococcus species NOT DETECTED NOT DETECTED Final   Streptococcus agalactiae NOT DETECTED NOT  DETECTED Final   Streptococcus pneumoniae NOT DETECTED NOT DETECTED Final   Streptococcus pyogenes NOT DETECTED NOT DETECTED Final   A.calcoaceticus-baumannii NOT DETECTED NOT DETECTED Final   Bacteroides fragilis NOT DETECTED NOT DETECTED Final   Enterobacterales NOT DETECTED NOT DETECTED Final   Enterobacter cloacae complex NOT DETECTED NOT DETECTED Final   Escherichia coli NOT DETECTED NOT DETECTED Final   Klebsiella aerogenes NOT DETECTED NOT DETECTED Final   Klebsiella oxytoca NOT DETECTED NOT DETECTED Final   Klebsiella pneumoniae NOT DETECTED NOT DETECTED Final   Proteus species NOT DETECTED NOT DETECTED Final   Salmonella species NOT DETECTED NOT DETECTED Final   Serratia marcescens NOT DETECTED NOT DETECTED Final   Haemophilus influenzae NOT DETECTED NOT DETECTED Final   Neisseria meningitidis NOT DETECTED NOT DETECTED Final   Pseudomonas aeruginosa NOT DETECTED NOT DETECTED Final   Stenotrophomonas maltophilia NOT DETECTED NOT DETECTED Final   Candida albicans NOT DETECTED NOT DETECTED Final   Candida auris NOT DETECTED NOT DETECTED Final   Candida glabrata NOT DETECTED NOT DETECTED Final   Candida krusei NOT DETECTED NOT DETECTED Final   Candida parapsilosis NOT DETECTED NOT DETECTED Final   Candida tropicalis NOT DETECTED NOT DETECTED Final    Cryptococcus neoformans/gattii NOT DETECTED NOT DETECTED Final   Meth resistant mecA/C and MREJ DETECTED (A) NOT DETECTED Final    Comment: CRITICAL RESULT CALLED TO, READ BACK BY AND VERIFIED WITH: RN CATHERINE FULCHER [id number] AT 1351 BY EC Performed at [facility], [address] [place], [place] [address]   MIC (1 Drug)-     Status: None (Preliminary result)   Collection Time: 02/17/24  2:48 PM  Result Value Ref Range Status   Min Inhibitory Conc (1 Drug) PENDING  Incomplete   Source DAPTOMYCIN  MIC MRSA BLOOD CULTURE  Final    Comment: Performed at [facility], [address], [place], [place] [address]  Blood Culture (routine x 2)     Status: None   Collection Time: 02/17/24  2:50 PM   Specimen: BLOOD LEFT WRIST  Result Value Ref Range Status   Specimen Description   Final    BLOOD LEFT WRIST BOTTLES DRAWN AEROBIC AND ANAEROBIC   Special Requests Blood Culture adequate volume  Final   Culture   Final    NO GROWTH 5 DAYS Performed at [facility], [address], [place], [place] [address]    Report Status 02/22/2024 FINAL  Final  C Difficile Quick Screen w PCR reflex     Status: Abnormal   Collection Time: 02/18/24  6:35 PM   Specimen: Stool  Result Value Ref Range Status   C Diff antigen POSITIVE (A) NEGATIVE Final   C Diff toxin NEGATIVE NEGATIVE Final   C Diff interpretation Results are indeterminate. See PCR results.  Final    Comment: Performed at [facility], [address], [place], [place] [address]  C. Diff by PCR, Reflexed     Status: None   Collection Time: 02/18/24  6:35 PM  Result Value Ref Range Status   Toxigenic C. Difficile by PCR NEGATIVE NEGATIVE Final    Comment: Patient is colonized with non toxigenic C. difficile. May not need treatment unless significant symptoms are present.   Hypervirulent Strain PRESUMPTIVE NEGATIVE PRESUMPTIVE NEGATIVE Final    Comment: Performed at [facility], [address] [place], [place] [address]   Gastrointestinal Panel by PCR , Stool     Status: None   Collection Time:  02/18/24  8:02 PM  Result Value Ref Range Status   Campylobacter species NOT DETECTED NOT DETECTED Final   Plesimonas shigelloides NOT DETECTED NOT DETECTED Final   Salmonella species NOT DETECTED NOT DETECTED Final   Yersinia enterocolitica NOT DETECTED NOT DETECTED Final   Vibrio species NOT DETECTED NOT DETECTED Final   Vibrio cholerae NOT DETECTED NOT DETECTED Final   Enteroaggregative E coli (EAEC) NOT DETECTED NOT DETECTED Final   Enteropathogenic E coli (EPEC) NOT DETECTED NOT DETECTED Final   Enterotoxigenic E coli (ETEC) NOT DETECTED NOT DETECTED Final   Shiga like toxin producing E coli (STEC) NOT DETECTED NOT DETECTED Final   Shigella/Enteroinvasive E coli (EIEC) NOT DETECTED NOT DETECTED Final   Cryptosporidium NOT DETECTED NOT DETECTED Final   Cyclospora cayetanensis NOT DETECTED NOT DETECTED Final   Entamoeba histolytica NOT DETECTED NOT DETECTED Final   Giardia lamblia NOT DETECTED NOT DETECTED Final   Adenovirus F40/41 NOT DETECTED NOT DETECTED Final   Astrovirus NOT DETECTED NOT DETECTED Final   Norovirus GI/GII NOT DETECTED NOT DETECTED Final   Rotavirus A NOT DETECTED NOT DETECTED Final   Sapovirus (I, II, IV, and V) NOT DETECTED NOT DETECTED Final    Comment: Performed at [facility], [address] [place], [place] [address]  Respiratory (~20 pathogens) panel by PCR     Status: None   Collection Time: 02/19/24 12:58 AM   Specimen: Nasopharyngeal Swab; Respiratory  Result Value Ref Range Status   Adenovirus NOT DETECTED NOT DETECTED Final   Coronavirus 229E NOT DETECTED NOT DETECTED Final    Comment: (NOTE) The Coronavirus on the Respiratory Panel, DOES NOT test for the novel  Coronavirus (2019 nCoV)    Coronavirus HKU1 NOT DETECTED NOT DETECTED Final   Coronavirus NL63 NOT DETECTED NOT DETECTED Final   Coronavirus OC43 NOT DETECTED NOT DETECTED Final   Metapneumovirus NOT  DETECTED NOT DETECTED Final   Rhinovirus / Enterovirus NOT DETECTED NOT DETECTED Final   Influenza A NOT DETECTED NOT DETECTED Final   Influenza B NOT DETECTED NOT DETECTED Final   Parainfluenza Virus 1 NOT DETECTED NOT DETECTED Final   Parainfluenza Virus 2 NOT DETECTED NOT DETECTED Final   Parainfluenza Virus 3 NOT DETECTED NOT DETECTED Final   Parainfluenza Virus 4 NOT DETECTED NOT DETECTED Final   Respiratory Syncytial Virus NOT DETECTED NOT DETECTED Final   Bordetella pertussis NOT DETECTED NOT DETECTED Final   Bordetella Parapertussis NOT DETECTED NOT DETECTED Final   Chlamydophila pneumoniae NOT DETECTED NOT DETECTED Final   Mycoplasma pneumoniae NOT DETECTED NOT DETECTED Final    Comment: Performed at [facility], [address], [place], [place] [address]  Culture, blood (Routine X 2) w Reflex to ID Panel     Status: None (Preliminary result)   Collection Time: 02/19/24  2:39 AM   Specimen: BLOOD RIGHT HAND  Result Value Ref Range Status   Specimen Description BLOOD RIGHT HAND  Final   Special Requests   Final    BOTTLES DRAWN AEROBIC AND ANAEROBIC Blood Culture adequate volume IMMUNOCOMPROMISED   Culture   Final    NO GROWTH 4 DAYS Performed at [facility], [address] [place], [place] [address]    Report Status PENDING  Incomplete  Culture, blood (Routine X 2) w Reflex to ID Panel     Status: None (Preliminary result)   Collection Time: 02/19/24  2:39 AM   Specimen: BLOOD RIGHT ARM  Result Value Ref Range Status  Specimen Description BLOOD RIGHT ARM  Final   Special Requests   Final    Immunocompromised BOTTLES DRAWN AEROBIC AND ANAEROBIC Blood Culture adequate volume   Culture   Final    NO GROWTH 4 DAYS Performed at [facility], [address], [place], [place] [address]    Report Status PENDING  Incomplete  Urine Culture     Status: None   Collection Time: 02/19/24  6:32 AM   Specimen: Urine, Random  Result Value Ref Range Status    Specimen Description URINE, RANDOM  Final   Special Requests URINE, CLEAN CATCH  Final   Culture   Final    NO GROWTH Performed at [facility], [address] [place], [place] [address]    Report Status 02/20/2024 FINAL  Final  Surgical pcr screen     Status: Abnormal   Collection Time: 02/20/24  2:18 PM   Specimen: Nasal Mucosa; Nasal Swab  Result Value Ref Range Status   MRSA, PCR POSITIVE (A) NEGATIVE Final    Comment: RESULT CALLED TO, READ BACK BY AND VERIFIED WITH: RN CANDIE SHUCK [phone] @ [phone] FH    Staphylococcus aureus POSITIVE (A) NEGATIVE Final    Comment: (NOTE) The Xpert SA Assay (FDA approved for NASAL specimens in patients 11 years of age and older), is one component of a comprehensive surveillance program. It is not intended to diagnose infection nor to guide or monitor treatment. Performed at [facility], [address], [place], [place] [address]      Serology:   Imaging: If present, new imagings (plain films, ct scans, and mri) have been personally visualized and interpreted; radiology reports have been reviewed. Decision making incorporated into the Impression / Recommendations.   1. Left ventricular ejection fraction, by estimation, is 60 to 65%. The  left ventricle has normal function.   2. Right ventricular systolic function is normal. The right ventricular  size is normal.   3. No left atrial/left atrial appendage thrombus was detected.   4. The mitral valve is normal in structure. Trivial mitral valve  regurgitation.   5. The aortic valve is tricuspid. Aortic valve regurgitation is trivial.  Aortic valve sclerosis/calcification is present, without any evidence of  aortic stenosis.   6. 3D performed of the mitral valve and 3D performed of the aortic valve  and demonstrates no vegetation.   Conclusion(s)/Recommendation(s): No evidence of vegetation/infective  endocarditis on this transesophageael echocardiogram.     Constance ONEIDA Passer,  MD [facility] [phone] pager    02/23/2024, 1:51 PM     [1]  Allergies Allergen Reactions   Ivp Dye [Iodinated Contrast Media] Other (See Comments)    Speed heart rate up    Penicillins Rash    Pt tolerated amoxicillin  oral challenge   Tetanus Toxoid-Containing Vaccines Rash   "

## 2024-02-23 NOTE — Progress Notes (Signed)
 Encouraged patient to get out of bed and work with physical therapy when they tried to get him up, educated patient on the importance of mobility. Patient became agitated. Demanded his phone to call family.

## 2024-02-23 NOTE — Progress Notes (Signed)
 Physical Therapy Treatment Patient Details Name: [name] MRN: [id number] DOB: 10-20-1950 Today's Date: 02/23/2024   History of Present Illness [name] is a 74 y.o. M adm 02/17/24 for concerns of sepsis with worsening right diabetic foot infection with abscess. MRI showed open wound along the medial aspect of the hindfoot with an underlying large complex abscess involving the subcutaneous tissues and also the underlying musculature as well as osteomyelitis involving the medial cuneiform and the calcaneus. 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  Readmit from [facility] with R BKA 02/21/23.    PT Comments  Pt is limited by pain and mild agitation during session. Pt rolls side to side to assist in hygiene tasks due to incontinence of stool. After cleaning the pt refuses further mobility and becomes agitated with PT attempts to encourage movement and to provide education regarding the implications of immobility. Pt also expresses significant pain and nausea but tells the PT he does not want medication to manage these symptoms when asked. PT will continue to follow in an effort to progress mobility when the pt is more agreeable and participatory. Patient will benefit from continued inpatient follow up therapy, <3 hours/day.    If plan is discharge home, recommend the following: Two people to help with walking and/or transfers;A lot of help with bathing/dressing/bathroom;Assistance with cooking/housework;Assist for transportation;Help with stairs or ramp for entrance   Can travel by private vehicle     No  Equipment Recommendations  Wheelchair (measurements PT);Wheelchair cushion (measurements PT);Hoyer lift;BSC/3in1    Recommendations for Other Services       Precautions / Restrictions Precautions Precautions: Fall Recall of Precautions/Restrictions:  Impaired Precaution/Restrictions Comments: Wound Vac RLE Required Braces or Orthoses: Other Brace Other Brace: Hanger limb protector Restrictions Weight Bearing Restrictions Per Provider Order: Yes RLE Weight Bearing Per Provider Order: Non weight bearing     Mobility  Bed Mobility Overal bed mobility: Needs Assistance Bed Mobility: Rolling Rolling: Min assist, Used rails         General bed mobility comments: pt rolls to each side to get cleaned by PT due to diarrhea. Pt then refuses further bed mobility and activity, becomes agitated with PT attempts to provide education on the implications of immobility.    Transfers                        Ambulation/Gait                   Stairs             Wheelchair Mobility     Tilt Bed    Modified Rankin (Stroke Patients Only)       Balance                                            Communication Communication Communication: Impaired Factors Affecting Communication: Reduced clarity of speech  Cognition Arousal: Alert Behavior During Therapy: Agitated   PT - Cognitive impairments: No family/caregiver present to determine baseline                       PT - Cognition Comments: alert, oriented x 4 Following commands: Impaired Following commands impaired: Follows  one step commands with increased time, Follows multi-step commands inconsistently    Cueing Cueing Techniques: Verbal cues  Exercises      General Comments General comments (skin integrity, edema, etc.): VSS on RA, pt is incontinent of liquid stool during session      Pertinent Vitals/Pain Pain Assessment Pain Assessment: Faces Faces Pain Scale: Hurts even more Pain Location: R residual limb Pain Descriptors / Indicators: Aching Pain Intervention(s): Limited activity within patient's tolerance, Other (comment) (provided education on the patient's ability to call and ask for pain medication if needed,  he declines needing them despite session being limited by pain)    Home Living                          Prior Function            PT Goals (current goals can now be found in the care plan section) Acute Rehab PT Goals Patient Stated Goal: Have less pain and move better Progress towards PT goals: Not progressing toward goals - comment (limited by agitation and pain)    Frequency    Min 2X/week      PT Plan      Co-evaluation              AM-PAC PT 6 Clicks Mobility   Outcome Measure  Help needed turning from your back to your side while in a flat bed without using bedrails?: A Little Help needed moving from lying on your back to sitting on the side of a flat bed without using bedrails?: Total Help needed moving to and from a bed to a chair (including a wheelchair)?: Total Help needed standing up from a chair using your arms (e.g., wheelchair or bedside chair)?: Total Help needed to walk in hospital room?: Total Help needed climbing 3-5 steps with a railing? : Total 6 Click Score: 8    End of Session   Activity Tolerance: Patient limited by pain;Treatment limited secondary to agitation Patient left: in bed;with call bell/phone within reach;with bed alarm set Nurse Communication: Mobility status;Need for lift equipment PT Visit Diagnosis: Difficulty in walking, not elsewhere classified (R26.2);Other abnormalities of gait and mobility (R26.89);Unsteadiness on feet (R26.81)     Time: 8880-8861 PT Time Calculation (min) (ACUTE ONLY): 19 min  Charges:    $Therapeutic Activity: 8-22 mins PT General Charges $$ ACUTE PT VISIT: 1 Visit                     Bernardino JINNY Ruth, PT, DPT Acute Rehabilitation Office [phone]    Bernardino JINNY Ruth 02/23/2024, 1:39 PM

## 2024-02-23 NOTE — Progress Notes (Signed)
 " PROGRESS NOTE    [name]  FMW:[id number] DOB: 22-Oct-1950 DOA: 02/17/2024 PCP: Marvine Rush, MD   Brief Narrative:  The patient is a 74 year old with history of CVA with residual slurred speech, CKD 3 AA, chronic osteomyelitis s/p right TMA, HTN, DM 2 admitted to the hospital for generally feeling sick and having nausea/vomiting.  Workup showed concerns of sepsis with worsening right diabetic foot infection with abscess.  Dr. Harden was consulted and patient was transferred from [facility] to [facility] for surgical intervention.  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  Hospital course complicated by gram-positive bacteremia, echocardiogram unremarkable. Underwent TEE and showed no Vegetation. ID recommending transitioning to po Abx on 1/12 for 3 more weeks.   Assessment and Plan:  Sepsis secondary to right diabetic foot infection associated with abscess and osteomyelitis Gram-positive bacteremia -Orthopedic consulted.  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  Postop recommendations by their service. -ID team is following -Repeat blood cultures 1/5 - NGTD -Had a Fever this AM of 100.6 -Echocardiogram showing preserved EF 65%, grade 1 DD.  No evidence of vegetation -TEE done and showed no Vegetation - ID recommends continuing IV daptomycin  and then transition to linezolid  on 1/12 to finish 3 more weeks with end date being 03/18/2024 -Repeat CXR done and showed Patchy left lower lobe opacity, possibly representing atelectasis and/or infection. -PT/OT recommending SNF  Normocytic Anemia: Combination of delusional and likely postsurgical blood loss.  Closely monitor.  Transfuse hemoglobin less than 7. Hgb/Hct Trend:  Recent Labs  Lab 02/17/24 1448 02/18/24 0513 02/19/24 0241 02/20/24 0009 02/22/24 0216 02/23/24 0008 02/23/24 0535  HGB 12.6* 9.5* 9.7* 8.7* 7.6* 7.5* 7.3*  HCT 38.8* 29.2* 29.6* 26.0* 22.8* 23.1* 22.1*  MCV 81.5 82.3 82.0 80.5 80.3 83.1 82.2  -Check Anemia Panel  in the AM. CTM for S/Sx of Bleeding; No overt bleeding noted. Repeat CBC in the AM    Type 2 diabetes mellitus with neurological complications, uncontrolled with hyperglycemia -A1c 9.8.  Sliding scale and Accu-Chek.  Will adjust as necessary. CBG Trend:  Recent Labs  Lab 02/22/24 1316 02/22/24 1712 02/22/24 2102 02/23/24 0807 02/23/24 1239 02/23/24 1717 02/23/24 2052  GLUCAP 103* 108* 117* 139* 146* 137* 187*    AKI on CKD stage 3b / Metabolic Acidosis: Baseline creatinine around 2.0, admission creatinine 4.82 with IV fluids renal function is slowly improving, creatinine 3.42. BUN/Cr Trend: Recent Labs  Lab 02/17/24 1448 02/18/24 0513 02/19/24 0241 02/20/24 0009 02/21/24 0515 02/22/24 0216 02/23/24 0535  BUN 51* 50* 52* 54* 49* 45* 39*  CREATININE 4.82* 4.53* 4.33* 3.80* 3.49* 3.42* 3.46*  -Has a slight metabolic acidosis with a CO2 of 21, anion gap of 9, chloride level 108 -Renal ultrasound does not show acute pathology; Given a dose of IV Lasix  40 mg x1.  -Avoid Nephrotoxic Medications, Contrast Dyes, Hypotension and Dehydration to Ensure Adequate Renal Perfusion and will need to Renally Adjust Meds. CTM & Trend Renal Function carefully and repeat CMP in the AM    Gastroenteritis:  Improved.  C. difficile antigen positive per toxigenic PCR is negative.  GI panel is neg.    Diffuse macular rash : ad mild symptoms, unclear.  Improving   Essential hypertension  Sinus tachycardia -Toprol -XL IV as needed. CTM BP per Protocol.   Hypomagnesemia/Hypokalemia: K+ and Mag Level Trend:  Recent Labs  Lab 02/17/24 1448 02/18/24 0513 02/19/24 0241 02/20/24 0009 02/20/24 0009 02/21/24 0515 02/22/24 0216 02/23/24 0535  K 3.9 3.7 3.4* 3.6  --  4.1 4.0 3.8  MG  --   --   --  1.5*   < > 1.7 1.5* 1.5*   < > = values in this interval not displayed.  -Replete w/ IV Mag Sulfate 2 grams. CTM and Replete as Necessary. Repeat CMP and Mag in the AM     DVT prophylaxis: heparin  injection  5,000 Units Start: 02/22/24 0600 SCD's Start: 02/21/24 1629    Code Status: Full Code Family Communication: No family present @ bedside   Disposition Plan:  Level of care: Telemetry Status is: Inpatient Remains inpatient appropriate because: Needs further clinical improvement and will need SNF   Consultants:  ID Orthopedic Surgery  Procedures:  As delineated as above  Antimicrobials:  Anti-infectives (From admission, onward)    Start     Dose/Rate Route Frequency Ordered Stop   02/26/24 1000  linezolid  (ZYVOX ) tablet 600 mg        600 mg Oral Every 12 hours 02/23/24 1650 03/19/24 0959   02/21/24 1650  DAPTOmycin  (CUBICIN ) IVPB 700 mg/185mL premix        8 mg/kg  90.2 kg 200 mL/hr over 30 Minutes Intravenous Every 48 hours 02/21/24 1650 02/26/24 2359   02/21/24 1153  ceFAZolin  (ANCEF ) 2-4 GM/100ML-% IVPB       Note to Pharmacy: Jonda Yancy HERO: cabinet override      02/21/24 1153 02/21/24 2359   02/20/24 0930  ceFAZolin  (ANCEF ) IVPB 2g/100 mL premix  Status:  Discontinued        2 g 200 mL/hr over 30 Minutes Intravenous On call to O.R. 02/20/24 0927 02/21/24 0559   02/19/24 1215  DAPTOmycin  (CUBICIN ) IVPB 700 mg/100mL premix  Status:  Discontinued        8 mg/kg  90.2 kg 200 mL/hr over 30 Minutes Intravenous Every 48 hours 02/19/24 1201 02/21/24 1650   02/18/24 1000  linezolid  (ZYVOX ) IVPB 600 mg  Status:  Discontinued        600 mg 300 mL/hr over 60 Minutes Intravenous Every 12 hours 02/17/24 1912 02/19/24 1159   02/18/24 0800  Ampicillin -Sulbactam (UNASYN ) 3 g in sodium chloride  0.9 % 100 mL IVPB  Status:  Discontinued        3 g 200 mL/hr over 30 Minutes Intravenous Every 12 hours 02/17/24 1912 02/22/24 1014   02/17/24 1715  cefTRIAXone  (ROCEPHIN ) 2 g in sodium chloride  0.9 % 100 mL IVPB  Status:  Discontinued        2 g 200 mL/hr over 30 Minutes Intravenous Every 24 hours 02/17/24 1709 02/17/24 1916   02/17/24 1430  vancomycin  (VANCOCIN ) IVPB 1000 mg/200 mL premix   Status:  Discontinued        1,000 mg 200 mL/hr over 60 Minutes Intravenous  Once 02/17/24 1421 02/17/24 1424   02/17/24 1430  vancomycin  (VANCOREADY) IVPB 2000 mg/400 mL        2,000 mg 200 mL/hr over 120 Minutes Intravenous  Once 02/17/24 1424 02/17/24 1700       Subjective: Seen and examined at bedside he was resting.  Complaining of some slight pain.  No nausea vomiting.  Denies any lightheadedness or dizziness.  Objective: Vitals:   02/23/24 0805 02/23/24 1100 02/23/24 1716 02/23/24 2050  BP: (!) 155/73  (!) 147/68 128/70  Pulse: 98  90 90  Resp:    18  Temp: (!) 100.6 F (38.1 C) 98.9 F (37.2 C) 99.5 F (37.5 C) 99.5 F (37.5 C)  TempSrc:  Oral  Oral  SpO2:  95%  94% 96%  Weight:      Height:        Intake/Output Summary (Last 24 hours) at 02/23/2024 2148 Last data filed at 02/23/2024 2051 Gross per 24 hour  Intake --  Output 2025 ml  Net -2025 ml   Filed Weights   02/17/24 1420 02/21/24 1215  Weight: 90.2 kg 90.2 kg   Examination: Physical Exam:  Constitutional: WN/WD overweight chronically ill-appearing Caucasian male in no acute distress Respiratory: Diminished to auscultation bilaterally, no wheezing, rales, rhonchi or crackles. Normal respiratory effort and patient is not tachypenic. No accessory muscle use.  Cardiovascular: RRR, no murmurs / rubs / gallops. S1 and S2 auscultated.  Mild extremity edema on the left Abdomen: Soft, non-tender, slightly distended today but habitus bowel sounds positive.  GU: Deferred. Musculoskeletal: Has a right BKA Skin: No rashes, lesions, ulcers on the limited skin evaluation. No induration; Warm and dry.  Neurologic: CN 2-12 grossly intact with no focal deficits. Romberg sign and cerebellar reflexes not assessed.  Psychiatric: Appears calm  Data Reviewed: I have personally reviewed following labs and imaging studies  CBC: Recent Labs  Lab 02/17/24 1448 02/18/24 0513 02/19/24 0241 02/20/24 0009 02/22/24 0216  02/23/24 0008 02/23/24 0535  WBC 4.8   < > 2.7* 2.6* 2.5* 3.0* 2.9*  NEUTROABS 3.4  --  1.7  --   --   --   --   HGB 12.6*   < > 9.7* 8.7* 7.6* 7.5* 7.3*  HCT 38.8*   < > 29.6* 26.0* 22.8* 23.1* 22.1*  MCV 81.5   < > 82.0 80.5 80.3 83.1 82.2  PLT 364   < > 257 255 233 196 202   < > = values in this interval not displayed.   Basic Metabolic Panel: Recent Labs  Lab 02/19/24 0241 02/20/24 0009 02/21/24 0515 02/22/24 0216 02/23/24 0535  NA 136 136 137 137 137  K 3.4* 3.6 4.1 4.0 3.8  CL 103 104 106 108 108  CO2 22 22 19* 20* 21*  GLUCOSE 103* 147* 115* 132* 179*  BUN 52* 54* 49* 45* 39*  CREATININE 4.33* 3.80* 3.49* 3.42* 3.46*  CALCIUM  7.8* 8.1* 8.0* 7.3* 7.1*  MG  --  1.5* 1.7 1.5* 1.5*  PHOS  --  3.7  --   --   --    GFR: Estimated Creatinine Clearance: 22.4 mL/min (A) (by C-G formula based on SCr of 3.46 mg/dL (H)). Liver Function Tests: Recent Labs  Lab 02/17/24 1448  AST 19  ALT 20  ALKPHOS 77  BILITOT 0.7  PROT 7.9  ALBUMIN 3.6   No results for input(s): LIPASE, AMYLASE in the last 168 hours. No results for input(s): AMMONIA in the last 168 hours. Coagulation Profile: Recent Labs  Lab 02/17/24 1448  INR 1.1   Cardiac Enzymes: Recent Labs  Lab 02/20/24 0009  CKTOTAL 17*   BNP (last 3 results) No results for input(s): PROBNP in the last 8760 hours. HbA1C: No results for input(s): HGBA1C in the last 72 hours. CBG: Recent Labs  Lab 02/22/24 2102 02/23/24 0807 02/23/24 1239 02/23/24 1717 02/23/24 2052  GLUCAP 117* 139* 146* 137* 187*   Lipid Profile: No results for input(s): CHOL, HDL, LDLCALC, TRIG, CHOLHDL, LDLDIRECT in the last 72 hours. Thyroid  Function Tests: No results for input(s): TSH, T4TOTAL, FREET4, T3FREE, THYROIDAB in the last 72 hours. Anemia Panel: No results for input(s): VITAMINB12, FOLATE, FERRITIN, TIBC, IRON, RETICCTPCT in the last 72 hours. Sepsis Labs: Recent Labs  Lab  02/17/24 1448 02/17/24 1627 02/18/24 0924  LATICACIDVEN 2.8* 2.5* 1.4   Recent Results (from the past 240 hours)  Resp panel by RT-PCR (RSV, Flu A&B, Covid) Anterior Nasal Swab     Status: None   Collection Time: 02/17/24  2:21 PM   Specimen: Anterior Nasal Swab  Result Value Ref Range Status   SARS Coronavirus 2 by RT PCR NEGATIVE NEGATIVE Final    Comment: (NOTE) SARS-CoV-2 target nucleic acids are NOT DETECTED.  The SARS-CoV-2 RNA is generally detectable in upper respiratory specimens during the acute phase of infection. The lowest concentration of SARS-CoV-2 viral copies this assay can detect is 138 copies/mL. A negative result does not preclude SARS-Cov-2 infection and should not be used as the sole basis for treatment or other patient management decisions. A negative result may occur with  improper specimen collection/handling, submission of specimen other than nasopharyngeal swab, presence of viral mutation(s) within the areas targeted by this assay, and inadequate number of viral copies(<138 copies/mL). A negative result must be combined with clinical observations, patient history, and epidemiological information. The expected result is Negative.  Fact Sheet for Patients:  bloggercourse.com  Fact Sheet for Healthcare Providers:  seriousbroker.it  This test is no t yet approved or cleared by the [place]  FDA and  has been authorized for detection and/or diagnosis of SARS-CoV-2 by FDA under an Emergency Use Authorization (EUA). This EUA will remain  in effect (meaning this test can be used) for the duration of the COVID-19 declaration under Section 564(b)(1) of the Act, 21 U.S.C.section 360bbb-3(b)(1), unless the authorization is terminated  or revoked sooner.       Influenza A by PCR NEGATIVE NEGATIVE Final   Influenza B by PCR NEGATIVE NEGATIVE Final    Comment: (NOTE) The Xpert Xpress SARS-CoV-2/FLU/RSV plus  assay is intended as an aid in the diagnosis of influenza from Nasopharyngeal swab specimens and should not be used as a sole basis for treatment. Nasal washings and aspirates are unacceptable for Xpert Xpress SARS-CoV-2/FLU/RSV testing.  Fact Sheet for Patients: bloggercourse.com  Fact Sheet for Healthcare Providers: seriousbroker.it  This test is not yet approved or cleared by the [place]  FDA and has been authorized for detection and/or diagnosis of SARS-CoV-2 by FDA under an Emergency Use Authorization (EUA). This EUA will remain in effect (meaning this test can be used) for the duration of the COVID-19 declaration under Section 564(b)(1) of the Act, 21 U.S.C. section 360bbb-3(b)(1), unless the authorization is terminated or revoked.     Resp Syncytial Virus by PCR NEGATIVE NEGATIVE Final    Comment: (NOTE) Fact Sheet for Patients: bloggercourse.com  Fact Sheet for Healthcare Providers: seriousbroker.it  This test is not yet approved or cleared by the [place]  FDA and has been authorized for detection and/or diagnosis of SARS-CoV-2 by FDA under an Emergency Use Authorization (EUA). This EUA will remain in effect (meaning this test can be used) for the duration of the COVID-19 declaration under Section 564(b)(1) of the Act, 21 U.S.C. section 360bbb-3(b)(1), unless the authorization is terminated or revoked.  Performed at [facility], [address] [place], [place] [address]   Blood Culture (routine x 2)     Status: Abnormal   Collection Time: 02/17/24  2:48 PM   Specimen: BLOOD LEFT ARM  Result Value Ref Range Status   Specimen Description   Final    BLOOD LEFT ARM BOTTLES DRAWN AEROBIC AND ANAEROBIC Performed at [facility], [address], [place], [place]  [address]    Special Requests   Final    Blood Culture adequate volume Performed at [facility], [address], [place], [place] [address]    Culture  Setup Time   Final    GRAM POSITIVE COCCI ANAEROBIC BOTTLE ONLY Gram Stain Report Called to,Read Back By and Verified With: C. FULCHER ON 02/18/2024 @10 :13AM BY T. HAMER  CRITICAL RESULT CALLED TO, READ BACK BY AND VERIFIED WITH: RN DOROTHYANN ISLAND [id number] AT 1351 BY EC Performed at [facility], [address], [place], [place] [address]    Culture METHICILLIN RESISTANT STAPHYLOCOCCUS AUREUS (A)  Final   Report Status 02/20/2024 FINAL  Final   Organism ID, Bacteria METHICILLIN RESISTANT STAPHYLOCOCCUS AUREUS  Final      Susceptibility   Methicillin resistant staphylococcus aureus - MIC*    CIPROFLOXACIN  >=8 RESISTANT Resistant     ERYTHROMYCIN <=0.25 SENSITIVE Sensitive     GENTAMICIN <=0.5 SENSITIVE Sensitive     OXACILLIN >=4 RESISTANT Resistant     TETRACYCLINE <=1 SENSITIVE Sensitive     VANCOMYCIN  1 SENSITIVE Sensitive     TRIMETH /SULFA  <=10 SENSITIVE Sensitive     CLINDAMYCIN <=0.25 SENSITIVE Sensitive     RIFAMPIN <=0.5 SENSITIVE Sensitive     Inducible Clindamycin NEGATIVE Sensitive     LINEZOLID  2 SENSITIVE Sensitive     * METHICILLIN RESISTANT STAPHYLOCOCCUS AUREUS  Blood Culture ID Panel (Reflexed)     Status: Abnormal   Collection Time: 02/17/24  2:48 PM  Result Value Ref Range Status   Enterococcus faecalis NOT DETECTED NOT DETECTED Final   Enterococcus Faecium NOT DETECTED NOT DETECTED Final   Listeria monocytogenes NOT DETECTED NOT DETECTED Final   Staphylococcus species DETECTED (A) NOT DETECTED Final    Comment: CRITICAL RESULT CALLED TO, READ BACK BY AND VERIFIED WITH: RN DOROTHYANN ISLAND [id number] AT 1351 BY EC    Staphylococcus aureus (BCID) DETECTED (A) NOT DETECTED Final    Comment: Methicillin (oxacillin)-resistant Staphylococcus aureus (MRSA). MRSA is predictably resistant to beta-lactam antibiotics (except ceftaroline). Preferred therapy is vancomycin  unless clinically contraindicated.  Patient requires contact precautions if  hospitalized. CRITICAL RESULT CALLED TO, READ BACK BY AND VERIFIED WITH: RN DOROTHYANN ISLAND [id number] AT 1351 BY EC    Staphylococcus epidermidis NOT DETECTED NOT DETECTED Final   Staphylococcus lugdunensis NOT DETECTED NOT DETECTED Final   Streptococcus species NOT DETECTED NOT DETECTED Final   Streptococcus agalactiae NOT DETECTED NOT DETECTED Final   Streptococcus pneumoniae NOT DETECTED NOT DETECTED Final   Streptococcus pyogenes NOT DETECTED NOT DETECTED Final   A.calcoaceticus-baumannii NOT DETECTED NOT DETECTED Final   Bacteroides fragilis NOT DETECTED NOT DETECTED Final   Enterobacterales NOT DETECTED NOT DETECTED Final   Enterobacter cloacae complex NOT DETECTED NOT DETECTED Final   Escherichia coli NOT DETECTED NOT DETECTED Final   Klebsiella aerogenes NOT DETECTED NOT DETECTED Final   Klebsiella oxytoca NOT DETECTED NOT DETECTED Final   Klebsiella pneumoniae NOT DETECTED NOT DETECTED Final   Proteus species NOT DETECTED NOT DETECTED Final   Salmonella species NOT DETECTED NOT DETECTED Final   Serratia marcescens NOT DETECTED NOT DETECTED Final   Haemophilus influenzae NOT DETECTED NOT DETECTED Final   Neisseria meningitidis NOT DETECTED NOT DETECTED Final   Pseudomonas aeruginosa NOT DETECTED NOT DETECTED Final   Stenotrophomonas maltophilia NOT DETECTED NOT DETECTED Final   Candida albicans NOT DETECTED NOT DETECTED Final   Candida auris NOT DETECTED NOT DETECTED Final   Candida glabrata NOT DETECTED NOT DETECTED Final  Candida krusei NOT DETECTED NOT DETECTED Final   Candida parapsilosis NOT DETECTED NOT DETECTED Final   Candida tropicalis NOT DETECTED NOT DETECTED Final   Cryptococcus neoformans/gattii NOT DETECTED NOT DETECTED Final   Meth resistant mecA/C and MREJ DETECTED (A) NOT DETECTED Final    Comment: CRITICAL RESULT CALLED TO, READ BACK BY AND VERIFIED WITH: RN DOROTHYANN ISLAND [id number] AT 1351 BY EC Performed at  [facility], [address] [place], [place] [address]   MIC (1 Drug)-     Status: Abnormal   Collection Time: 02/17/24  2:48 PM  Result Value Ref Range Status   Min Inhibitory Conc (1 Drug) Preliminary report (A)  Final    Comment: (NOTE) Performed At: [facility] [address] [place], [place] [id number] Jennette Shorter MD Ey:[id number]    Source DAPTOMYCIN  MIC MRSA BLOOD CULTURE  Final    Comment: Performed at [facility], [address], [place], [place] [address]  MIC Result     Status: Abnormal   Collection Time: 02/17/24  2:48 PM  Result Value Ref Range Status   Result 1 (MIC) Comment (A)  Final    Comment: (NOTE) Methicillin - resistant Staphylococcus aureus Identification performed by account, not confirmed by this laboratory. Testing performed by broth microdilution. DAPTOMYCIN  Performed At: [facility] [address] [place], [place] [id number] Jennette Shorter MD Ey:[id number]   Blood Culture (routine x 2)     Status: None   Collection Time: 02/17/24  2:50 PM   Specimen: BLOOD LEFT WRIST  Result Value Ref Range Status   Specimen Description   Final    BLOOD LEFT WRIST BOTTLES DRAWN AEROBIC AND ANAEROBIC   Special Requests Blood Culture adequate volume  Final   Culture   Final    NO GROWTH 5 DAYS Performed at [facility], [address] [place], [place] [address]    Report Status 02/22/2024 FINAL  Final  C Difficile Quick Screen w PCR reflex     Status: Abnormal   Collection Time: 02/18/24  6:35 PM   Specimen: Stool  Result Value Ref Range Status   C Diff antigen POSITIVE (A) NEGATIVE Final   C Diff toxin NEGATIVE NEGATIVE Final   C Diff interpretation Results are indeterminate. See PCR results.  Final    Comment: Performed at [facility], [address], [place], [place] [address]  C. Diff by PCR, Reflexed     Status: None   Collection Time: 02/18/24  6:35 PM  Result Value Ref Range Status   Toxigenic C. Difficile by PCR  NEGATIVE NEGATIVE Final    Comment: Patient is colonized with non toxigenic C. difficile. May not need treatment unless significant symptoms are present.   Hypervirulent Strain PRESUMPTIVE NEGATIVE PRESUMPTIVE NEGATIVE Final    Comment: Performed at [facility], [address], [place], [place] [address]  Gastrointestinal Panel by PCR , Stool     Status: None   Collection Time: 02/18/24  8:02 PM  Result Value Ref Range Status   Campylobacter species NOT DETECTED NOT DETECTED Final   Plesimonas shigelloides NOT DETECTED NOT DETECTED Final   Salmonella species NOT DETECTED NOT DETECTED Final   Yersinia enterocolitica NOT DETECTED NOT DETECTED Final   Vibrio species NOT DETECTED NOT DETECTED Final   Vibrio cholerae NOT DETECTED NOT DETECTED Final   Enteroaggregative E coli (EAEC) NOT DETECTED NOT DETECTED Final   Enteropathogenic E coli (EPEC) NOT DETECTED NOT DETECTED Final   Enterotoxigenic  E coli (ETEC) NOT DETECTED NOT DETECTED Final   Shiga like toxin producing E coli (STEC) NOT DETECTED NOT DETECTED Final   Shigella/Enteroinvasive E coli (EIEC) NOT DETECTED NOT DETECTED Final   Cryptosporidium NOT DETECTED NOT DETECTED Final   Cyclospora cayetanensis NOT DETECTED NOT DETECTED Final   Entamoeba histolytica NOT DETECTED NOT DETECTED Final   Giardia lamblia NOT DETECTED NOT DETECTED Final   Adenovirus F40/41 NOT DETECTED NOT DETECTED Final   Astrovirus NOT DETECTED NOT DETECTED Final   Norovirus GI/GII NOT DETECTED NOT DETECTED Final   Rotavirus A NOT DETECTED NOT DETECTED Final   Sapovirus (I, II, IV, and V) NOT DETECTED NOT DETECTED Final    Comment: Performed at [facility], [address] [place], [place] [address]  Respiratory (~20 pathogens) panel by PCR     Status: None   Collection Time: 02/19/24 12:58 AM   Specimen: Nasopharyngeal Swab; Respiratory  Result Value Ref Range Status   Adenovirus NOT DETECTED NOT DETECTED Final   Coronavirus 229E NOT DETECTED  NOT DETECTED Final    Comment: (NOTE) The Coronavirus on the Respiratory Panel, DOES NOT test for the novel  Coronavirus (2019 nCoV)    Coronavirus HKU1 NOT DETECTED NOT DETECTED Final   Coronavirus NL63 NOT DETECTED NOT DETECTED Final   Coronavirus OC43 NOT DETECTED NOT DETECTED Final   Metapneumovirus NOT DETECTED NOT DETECTED Final   Rhinovirus / Enterovirus NOT DETECTED NOT DETECTED Final   Influenza A NOT DETECTED NOT DETECTED Final   Influenza B NOT DETECTED NOT DETECTED Final   Parainfluenza Virus 1 NOT DETECTED NOT DETECTED Final   Parainfluenza Virus 2 NOT DETECTED NOT DETECTED Final   Parainfluenza Virus 3 NOT DETECTED NOT DETECTED Final   Parainfluenza Virus 4 NOT DETECTED NOT DETECTED Final   Respiratory Syncytial Virus NOT DETECTED NOT DETECTED Final   Bordetella pertussis NOT DETECTED NOT DETECTED Final   Bordetella Parapertussis NOT DETECTED NOT DETECTED Final   Chlamydophila pneumoniae NOT DETECTED NOT DETECTED Final   Mycoplasma pneumoniae NOT DETECTED NOT DETECTED Final    Comment: Performed at [facility], [address], [place], [place] [address]  Culture, blood (Routine X 2) w Reflex to ID Panel     Status: None (Preliminary result)   Collection Time: 02/19/24  2:39 AM   Specimen: BLOOD RIGHT HAND  Result Value Ref Range Status   Specimen Description BLOOD RIGHT HAND  Final   Special Requests   Final    BOTTLES DRAWN AEROBIC AND ANAEROBIC Blood Culture adequate volume IMMUNOCOMPROMISED   Culture   Final    NO GROWTH 4 DAYS Performed at [facility], [address] [place], [place] [address]    Report Status PENDING  Incomplete  Culture, blood (Routine X 2) w Reflex to ID Panel     Status: None (Preliminary result)   Collection Time: 02/19/24  2:39 AM   Specimen: BLOOD RIGHT ARM  Result Value Ref Range Status   Specimen Description BLOOD RIGHT ARM  Final   Special Requests   Final    Immunocompromised BOTTLES DRAWN AEROBIC AND ANAEROBIC Blood  Culture adequate volume   Culture   Final    NO GROWTH 4 DAYS Performed at [facility], [address] [place], [place] [address]    Report Status PENDING  Incomplete  Urine Culture     Status: None   Collection Time: 02/19/24  6:32 AM   Specimen: Urine, Random  Result Value  Ref Range Status   Specimen Description URINE, RANDOM  Final   Special Requests URINE, CLEAN CATCH  Final   Culture   Final    NO GROWTH Performed at [facility], [address] [place], [place] [address]    Report Status 02/20/2024 FINAL  Final  Surgical pcr screen     Status: Abnormal   Collection Time: 02/20/24  2:18 PM   Specimen: Nasal Mucosa; Nasal Swab  Result Value Ref Range Status   MRSA, PCR POSITIVE (A) NEGATIVE Final    Comment: RESULT CALLED TO, READ BACK BY AND VERIFIED WITH: RN CANDIE SHUCK [phone] @ [phone] FH    Staphylococcus aureus POSITIVE (A) NEGATIVE Final    Comment: (NOTE) The Xpert SA Assay (FDA approved for NASAL specimens in patients 4 years of age and older), is one component of a comprehensive surveillance program. It is not intended to diagnose infection nor to guide or monitor treatment. Performed at [facility], [address], [place], [place] [address]     Radiology Studies: DG CHEST PORT 1 VIEW Result Date: 02/23/2024 EXAM: 1 VIEW(S) XRAY OF THE CHEST 02/23/2024 09:27:00 AM COMPARISON: 02/17/2024 CLINICAL HISTORY: Cough; Fever FINDINGS: LUNGS AND PLEURA: New left lower lung patchy opacities. No pleural effusion. No pneumothorax. HEART AND MEDIASTINUM: No acute abnormality of the cardiac and mediastinal silhouettes. BONES AND SOFT TISSUES: No acute osseous abnormality. IMPRESSION: 1. Patchy left lower lobe opacity, possibly representing atelectasis and/or infection. Electronically signed by: Michaeline Blanch MD 02/23/2024 04:23 PM EST RP Workstation: [id number]   ECHO TEE Result Date: 02/22/2024    TRANSESOPHOGEAL ECHO REPORT   Patient Name:   [name]  Date of Exam: 02/22/2024 Medical Rec #:  [id number]        Height:       74.0 in Accession #:    [id number]       Weight:       198.9 lb Date of Birth:  [birth year]        BSA:          2.168 m Patient Age:    73 years         BP:           148/62 mmHg Patient Gender: M                HR:           90 bpm. Exam Location:  Inpatient Procedure: 3D Echo, Transesophageal Echo, Cardiac Doppler and Color Doppler            (Both Spectral and Color Flow Doppler were utilized during            procedure). Indications:    Bacteremia  History:        Patient has prior history of Echocardiogram examinations, most                 recent 02/19/2024.  Sonographer:    Tinnie Gosling RDCS Referring Phys: [id number] ZANE ADAMS PROCEDURE: After discussion of the risks and benefits of a TEE, an informed consent was obtained from the patient. The transesophogeal probe was passed without difficulty through the esophogus of the patient. Sedation performed by different physician. The patient developed no complications during the procedure.  IMPRESSIONS  1. Left ventricular ejection fraction, by estimation, is 60 to 65%. The left ventricle has normal function.  2. Right ventricular systolic function is normal. The right ventricular size is normal.  3. No  left atrial/left atrial appendage thrombus was detected.  4. The mitral valve is normal in structure. Trivial mitral valve regurgitation.  5. The aortic valve is tricuspid. Aortic valve regurgitation is trivial. Aortic valve sclerosis/calcification is present, without any evidence of aortic stenosis.  6. 3D performed of the mitral valve and 3D performed of the aortic valve and demonstrates no vegetation. Conclusion(s)/Recommendation(s): No evidence of vegetation/infective endocarditis on this transesophageael echocardiogram. FINDINGS  Left Ventricle: Left ventricular ejection fraction, by estimation, is 60 to 65%. The left ventricle has normal function. The left ventricular internal cavity size was  normal in size. Right Ventricle: The right ventricular size is normal. No increase in right ventricular wall thickness. Right ventricular systolic function is normal. Left Atrium: Left atrial size was normal in size. No left atrial/left atrial appendage thrombus was detected. Right Atrium: Right atrial size was normal in size. Pericardium: There is no evidence of pericardial effusion. Mitral Valve: The mitral valve is normal in structure. Trivial mitral valve regurgitation. Tricuspid Valve: The tricuspid valve is normal in structure. Tricuspid valve regurgitation is trivial. Aortic Valve: The aortic valve is tricuspid. Aortic valve regurgitation is trivial. Aortic valve sclerosis/calcification is present, without any evidence of aortic stenosis. Pulmonic Valve: The pulmonic valve was grossly normal. Pulmonic valve regurgitation is mild. Aorta: The aortic root and ascending aorta are structurally normal, with no evidence of dilitation. IAS/Shunts: No atrial level shunt detected by color flow Doppler. Additional Comments: 3D was performed not requiring image post processing on an independent workstation and was normal. Lonni Nanas MD Electronically signed by Lonni Nanas MD Signature Date/Time: 02/22/2024/3:07:38 PM    Final    EP STUDY Result Date: 02/22/2024 See surgical note for result.  Scheduled Meds:  acidophilus  1 capsule Oral Daily   ascorbic acid   1,000 mg Oral Daily   benzonatate   100 mg Oral TID   Chlorhexidine  Gluconate Cloth  6 each Topical Daily   docusate sodium   100 mg Oral Daily   ferrous sulfate   325 mg Oral TID WC   heparin   5,000 Units Subcutaneous Q8H   insulin  aspart  0-15 Units Subcutaneous Q4H   insulin  aspart  3 Units Subcutaneous TID WC   insulin  glargine-yfgn  10 Units Subcutaneous QHS   [START ON 02/26/2024] linezolid   600 mg Oral Q12H   metoprolol  succinate  50 mg Oral Daily   mupirocin  ointment  1 Application Nasal BID   nutrition supplement (JUVEN)  1  packet Oral BID BM   sodium chloride  flush  3 mL Intravenous Q12H   Vitamin D  (Ergocalciferol )  50,000 Units Oral QHS   zinc  sulfate (50mg  elemental zinc )  220 mg Oral Daily   Continuous Infusions:  DAPTOmycin  700 mg (02/23/24 1750)    LOS: 6 days   Alejandro Marker, DO [facility] Available via Epic secure chat 7am-7pm After these hours, please refer to coverage provider listed on [email or web address] 02/23/2024, 9:48 PM  "

## 2024-02-23 NOTE — TOC Progression Note (Signed)
 Transition of Care [facility]) - Progression Note    Patient Details  Name: [name] MRN: [id number] Date of Birth: 11/01/1950  [facility] CM/SW Contact  Luann SHAUNNA [place], [place] Phone Number: 02/23/2024, 2:18 PM  Clinical Narrative:     [facility] confirmed bed offer. CSW updated pt. TOC will continue to follow to assist with eventual DC to SNF. Will need to initiate insurance auth request closer to DC.   Expected Discharge Plan: Skilled Nursing Facility Barriers to Discharge: Continued Medical Work up, SNF Pending bed offer, English As A Second Language Teacher               Expected Discharge Plan and Services In-house Referral: Clinical Social Work Discharge Planning Services: CM Consult   Living arrangements for the past 2 months: Single Family Home                                       Social Drivers of Health (SDOH) Interventions SDOH Screenings   Food Insecurity: No Food Insecurity (02/17/2024)  Housing: Low Risk (02/17/2024)  Transportation Needs: No Transportation Needs (02/17/2024)  Utilities: Not At Risk (02/17/2024)  Financial Resource Strain: Low Risk (12/13/2021)  Social Connections: Socially Isolated (02/17/2024)  Tobacco Use: Low Risk (02/21/2024)    Readmission Risk Interventions    11/30/2021    2:51 PM  Readmission Risk Prevention Plan  Transportation Screening Complete  HRI or Home Care Consult Complete  Social Work Consult for Recovery Care Planning/Counseling Complete  Palliative Care Screening Not Applicable  Medication Review Oceanographer) Complete

## 2024-02-24 DIAGNOSIS — S98111A Complete traumatic amputation of right great toe, initial encounter: Secondary | ICD-10-CM

## 2024-02-24 DIAGNOSIS — L089 Local infection of the skin and subcutaneous tissue, unspecified: Secondary | ICD-10-CM | POA: Diagnosis not present

## 2024-02-24 DIAGNOSIS — B9562 Methicillin resistant Staphylococcus aureus infection as the cause of diseases classified elsewhere: Secondary | ICD-10-CM | POA: Diagnosis not present

## 2024-02-24 DIAGNOSIS — K529 Noninfective gastroenteritis and colitis, unspecified: Secondary | ICD-10-CM | POA: Diagnosis not present

## 2024-02-24 DIAGNOSIS — F79 Unspecified intellectual disabilities: Secondary | ICD-10-CM

## 2024-02-24 DIAGNOSIS — M009 Pyogenic arthritis, unspecified: Secondary | ICD-10-CM | POA: Diagnosis not present

## 2024-02-24 DIAGNOSIS — Z89431 Acquired absence of right foot: Secondary | ICD-10-CM | POA: Diagnosis not present

## 2024-02-24 DIAGNOSIS — R7881 Bacteremia: Secondary | ICD-10-CM | POA: Diagnosis not present

## 2024-02-24 DIAGNOSIS — L03115 Cellulitis of right lower limb: Secondary | ICD-10-CM | POA: Diagnosis not present

## 2024-02-24 DIAGNOSIS — E11628 Type 2 diabetes mellitus with other skin complications: Secondary | ICD-10-CM | POA: Diagnosis not present

## 2024-02-24 DIAGNOSIS — L02611 Cutaneous abscess of right foot: Secondary | ICD-10-CM | POA: Diagnosis not present

## 2024-02-24 LAB — GLUCOSE, CAPILLARY
Glucose-Capillary: 127 mg/dL — ABNORMAL HIGH (ref 70–99)
Glucose-Capillary: 131 mg/dL — ABNORMAL HIGH (ref 70–99)
Glucose-Capillary: 134 mg/dL — ABNORMAL HIGH (ref 70–99)
Glucose-Capillary: 135 mg/dL — ABNORMAL HIGH (ref 70–99)
Glucose-Capillary: 161 mg/dL — ABNORMAL HIGH (ref 70–99)
Glucose-Capillary: 179 mg/dL — ABNORMAL HIGH (ref 70–99)

## 2024-02-24 LAB — CBC WITH DIFFERENTIAL/PLATELET
Abs Immature Granulocytes: 0.13 K/uL — ABNORMAL HIGH (ref 0.00–0.07)
Basophils Absolute: 0 K/uL (ref 0.0–0.1)
Basophils Relative: 0 %
Eosinophils Absolute: 0.1 K/uL (ref 0.0–0.5)
Eosinophils Relative: 1 %
HCT: 26.7 % — ABNORMAL LOW (ref 39.0–52.0)
Hemoglobin: 8.9 g/dL — ABNORMAL LOW (ref 13.0–17.0)
Immature Granulocytes: 4 %
Lymphocytes Relative: 32 %
Lymphs Abs: 1.1 K/uL (ref 0.7–4.0)
MCH: 27.2 pg (ref 26.0–34.0)
MCHC: 33.3 g/dL (ref 30.0–36.0)
MCV: 81.7 fL (ref 80.0–100.0)
Monocytes Absolute: 0.2 K/uL (ref 0.1–1.0)
Monocytes Relative: 6 %
Neutro Abs: 2 K/uL (ref 1.7–7.7)
Neutrophils Relative %: 57 %
Platelets: 237 K/uL (ref 150–400)
RBC: 3.27 MIL/uL — ABNORMAL LOW (ref 4.22–5.81)
RDW: 16.1 % — ABNORMAL HIGH (ref 11.5–15.5)
WBC: 3.5 K/uL — ABNORMAL LOW (ref 4.0–10.5)
nRBC: 0.6 % — ABNORMAL HIGH (ref 0.0–0.2)

## 2024-02-24 LAB — CULTURE, BLOOD (ROUTINE X 2)
Culture: NO GROWTH
Culture: NO GROWTH

## 2024-02-24 LAB — COMPREHENSIVE METABOLIC PANEL WITH GFR
ALT: 6 U/L (ref 0–44)
AST: 23 U/L (ref 15–41)
Albumin: 2.3 g/dL — ABNORMAL LOW (ref 3.5–5.0)
Alkaline Phosphatase: 43 U/L (ref 38–126)
Anion gap: 9 (ref 5–15)
BUN: 44 mg/dL — ABNORMAL HIGH (ref 8–23)
CO2: 22 mmol/L (ref 22–32)
Calcium: 7.4 mg/dL — ABNORMAL LOW (ref 8.9–10.3)
Chloride: 102 mmol/L (ref 98–111)
Creatinine, Ser: 3.29 mg/dL — ABNORMAL HIGH (ref 0.61–1.24)
GFR, Estimated: 19 mL/min — ABNORMAL LOW
Glucose, Bld: 131 mg/dL — ABNORMAL HIGH (ref 70–99)
Potassium: 3.9 mmol/L (ref 3.5–5.1)
Sodium: 133 mmol/L — ABNORMAL LOW (ref 135–145)
Total Bilirubin: <0.2 mg/dL (ref 0.0–1.2)
Total Protein: 5.3 g/dL — ABNORMAL LOW (ref 6.5–8.1)

## 2024-02-24 LAB — RETICULOCYTES
Immature Retic Fract: 21.4 % — ABNORMAL HIGH (ref 2.3–15.9)
RBC.: 3.2 MIL/uL — ABNORMAL LOW (ref 4.22–5.81)
Retic Count, Absolute: 81.9 K/uL (ref 19.0–186.0)
Retic Ct Pct: 2.6 % (ref 0.4–3.1)

## 2024-02-24 LAB — MINIMUM INHIBITORY CONC. (1 DRUG)

## 2024-02-24 LAB — FERRITIN: Ferritin: 307 ng/mL (ref 24–336)

## 2024-02-24 LAB — MAGNESIUM: Magnesium: 1.9 mg/dL (ref 1.7–2.4)

## 2024-02-24 LAB — IRON AND TIBC
Iron: 14 ug/dL — ABNORMAL LOW (ref 45–182)
Saturation Ratios: 7 % — ABNORMAL LOW (ref 17.9–39.5)
TIBC: 203 ug/dL — ABNORMAL LOW (ref 250–450)
UIBC: 189 ug/dL

## 2024-02-24 LAB — FOLATE: Folate: 7.3 ng/mL

## 2024-02-24 LAB — MIC RESULT

## 2024-02-24 LAB — PHOSPHORUS: Phosphorus: 4 mg/dL (ref 2.5–4.6)

## 2024-02-24 MED ORDER — INSULIN ASPART 100 UNIT/ML IJ SOLN
0.0000 [IU] | Freq: Every day | INTRAMUSCULAR | Status: DC
  Administered 2024-02-25: 5 [IU] via SUBCUTANEOUS
  Administered 2024-02-26: 2 [IU] via SUBCUTANEOUS
  Administered 2024-02-27: 4 [IU] via SUBCUTANEOUS
  Filled 2024-02-24: qty 2
  Filled 2024-02-24: qty 4
  Filled 2024-02-24: qty 5

## 2024-02-24 MED ORDER — INSULIN ASPART 100 UNIT/ML IJ SOLN
0.0000 [IU] | Freq: Three times a day (TID) | INTRAMUSCULAR | Status: DC
  Administered 2024-02-25: 2 [IU] via SUBCUTANEOUS
  Administered 2024-02-25: 5 [IU] via SUBCUTANEOUS
  Administered 2024-02-26: 15 [IU] via SUBCUTANEOUS
  Administered 2024-02-26 (×2): 5 [IU] via SUBCUTANEOUS
  Administered 2024-02-27 (×2): 8 [IU] via SUBCUTANEOUS
  Administered 2024-02-27: 15 [IU] via SUBCUTANEOUS
  Administered 2024-02-28: 5 [IU] via SUBCUTANEOUS
  Administered 2024-02-28: 8 [IU] via SUBCUTANEOUS
  Filled 2024-02-24: qty 15
  Filled 2024-02-24: qty 3
  Filled 2024-02-24: qty 8
  Filled 2024-02-24 (×2): qty 5
  Filled 2024-02-24: qty 8
  Filled 2024-02-24: qty 3
  Filled 2024-02-24: qty 2
  Filled 2024-02-24: qty 8
  Filled 2024-02-24: qty 5

## 2024-02-24 NOTE — Plan of Care (Signed)

## 2024-02-24 NOTE — Plan of Care (Signed)
   Problem: Clinical Measurements: Goal: Will remain free from infection Outcome: Progressing   Problem: Clinical Measurements: Goal: Diagnostic test results will improve Outcome: Progressing   Problem: Clinical Measurements: Goal: Respiratory complications will improve Outcome: Progressing

## 2024-02-24 NOTE — Progress Notes (Signed)
 " PROGRESS NOTE    [name]  FMW:[id number] DOB: [birth year] DOA: 02/17/2024 PCP: Marvine Rush, MD   Brief Narrative:  The patient is a 74 year old with history of CVA with residual slurred speech, CKD 3 AA, chronic osteomyelitis s/p right TMA, HTN, DM 2 admitted to the hospital for generally feeling sick and having nausea/vomiting.  Workup showed concerns of sepsis with worsening right diabetic foot infection with abscess.  Dr. Harden was consulted and patient was transferred from [facility] to [facility] for surgical intervention.  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  Hospital course complicated by gram-positive bacteremia, echocardiogram unremarkable. Underwent TEE and showed no Vegetation. ID recommending transitioning to po Abx on 1/12 for 3 more weeks.   Assessment and Plan:  Sepsis secondary to right diabetic foot infection associated with abscess and osteomyelitis Gram-positive bacteremia -Orthopedic consulted.  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  Postop recommendations by their service. -ID team is following -Repeat blood cultures 1/5 - NGTD -Had a Fever this AM of 100.6 -Echocardiogram showing preserved EF 65%, grade 1 DD.  No evidence of vegetation -TEE done and showed no Vegetation - ID recommends continuing IV daptomycin  and then transition to linezolid  on 1/12 to finish 3 more weeks with end date being 03/18/2024 -Repeat CXR done and showed Patchy left lower lobe opacity, possibly representing atelectasis and/or infection. -PT/OT recommending SNF and likely will be Monday as we will transition him from IV antibiotics to oral antibiotics at that time  Normocytic Anemia: Combination of delusional and likely postsurgical blood loss.  Closely monitor.  Transfuse hemoglobin less than 7. Hgb/Hct Trend:  Recent Labs  Lab 02/18/24 0513 02/19/24 0241 02/20/24 0009 02/22/24 0216 02/23/24 0008 02/23/24 0535 02/24/24 0456  HGB 9.5* 9.7* 8.7* 7.6* 7.5* 7.3* 8.9*  HCT  29.2* 29.6* 26.0* 22.8* 23.1* 22.1* 26.7*  MCV 82.3 82.0 80.5 80.3 83.1 82.2 81.7  -Checked Anemia Panel and showed Iron level 14, UIBC 189, TIBC 203, saturation ration 7%, ferritin level 307, folate level 7.3. CTM for S/Sx of Bleeding; No overt bleeding noted. Repeat CBC in the AM    Type 2 Diabetes Mellitus with neurological complications, uncontrolled with hyperglycemia -A1c 9.8.  Sliding scale and Accu-Chek.  Will adjust as necessary. CBG Trend:  Recent Labs  Lab 02/23/24 1717 02/23/24 2052 02/24/24 0017 02/24/24 0414 02/24/24 0751 02/24/24 1133 02/24/24 1528  GLUCAP 137* 187* 131* 134* 127* 179* 161*    AKI on CKD stage 3b / Metabolic Acidosis: Baseline creatinine around 2.0, admission creatinine 4.82 with IV fluids renal function is slowly improving, creatinine 3.42. BUN/Cr Trend: Recent Labs  Lab 02/18/24 0513 02/19/24 0241 02/20/24 0009 02/21/24 0515 02/22/24 0216 02/23/24 0535 02/24/24 0456  BUN 50* 52* 54* 49* 45* 39* 44*  CREATININE 4.53* 4.33* 3.80* 3.49* 3.42* 3.46* 3.29*  -Had a slight metabolic acidosis with a CO2 of 21, anion gap of 9, chloride level 108; Now improved and CO2 is 22, AG is 9, Chloride Level is 102  -Renal ultrasound does not show acute pathology; Given a dose of IV Lasix  40 mg x1 yesterday -Avoid Nephrotoxic Medications, Contrast Dyes, Hypotension and Dehydration to Ensure Adequate Renal Perfusion and will need to Renally Adjust Meds. CTM & Trend Renal Function carefully and repeat CMP in the AM    Gastroenteritis:  Improved.  C. difficile antigen positive per toxigenic PCR is negative.  GI panel is neg.    Diffuse macular rash : ad mild symptoms, unclear.  Improving   Essential  hypertension  Sinus tachycardia -Toprol -XL IV as needed. CTM BP per Protocol. Last BP reading was 160/66  Hypomagnesemia/Hypokalemia: K+ and Mag Level Trend:  Recent Labs  Lab 02/18/24 0513 02/19/24 0241 02/20/24 0009 02/20/24 0009 02/21/24 0515 02/22/24 0216  02/23/24 0535 02/24/24 0456  K 3.7 3.4* 3.6  --  4.1 4.0 3.8 3.9  MG  --   --  1.5*   < > 1.7 1.5* 1.5* 1.9   < > = values in this interval not displayed.  -Replete w/ IV Mag Sulfate 2 grams. CTM and Replete as Necessary. Repeat CMP and Mag in the AM   Hypoalbuminemia: Patient's Albumin Lvl 3.6 -> 2.3. CTM & Trend a& Repeat CMP in the AM    DVT prophylaxis: heparin  injection 5,000 Units Start: 02/22/24 0600 SCD's Start: 02/21/24 1629    Code Status: Full Code Family Communication: No family present @ Bedside   Disposition Plan:  Level of care: Telemetry Status is: Inpatient Remains inpatient appropriate because: Needs SNF and Insurance Auth   Consultants:  ID Orthopedic Surgery  Procedures:  As delineated as above   Antimicrobials:  Anti-infectives (From admission, onward)    Start     Dose/Rate Route Frequency Ordered Stop   02/26/24 1000  linezolid  (ZYVOX ) tablet 600 mg        600 mg Oral Every 12 hours 02/23/24 1650 03/19/24 0959   02/21/24 1650  DAPTOmycin  (CUBICIN ) IVPB 700 mg/122mL premix        8 mg/kg  90.2 kg 200 mL/hr over 30 Minutes Intravenous Every 48 hours 02/21/24 1650 02/26/24 2359   02/21/24 1153  ceFAZolin  (ANCEF ) 2-4 GM/100ML-% IVPB       Note to Pharmacy: Jonda Bottoms M: cabinet override      02/21/24 1153 02/21/24 2359   02/20/24 0930  ceFAZolin  (ANCEF ) IVPB 2g/100 mL premix  Status:  Discontinued        2 g 200 mL/hr over 30 Minutes Intravenous On call to O.R. 02/20/24 0927 02/21/24 0559   02/19/24 1215  DAPTOmycin  (CUBICIN ) IVPB 700 mg/139mL premix  Status:  Discontinued        8 mg/kg  90.2 kg 200 mL/hr over 30 Minutes Intravenous Every 48 hours 02/19/24 1201 02/21/24 1650   02/18/24 1000  linezolid  (ZYVOX ) IVPB 600 mg  Status:  Discontinued        600 mg 300 mL/hr over 60 Minutes Intravenous Every 12 hours 02/17/24 1912 02/19/24 1159   02/18/24 0800  Ampicillin -Sulbactam (UNASYN ) 3 g in sodium chloride  0.9 % 100 mL IVPB  Status:   Discontinued        3 g 200 mL/hr over 30 Minutes Intravenous Every 12 hours 02/17/24 1912 02/22/24 1014   02/17/24 1715  cefTRIAXone  (ROCEPHIN ) 2 g in sodium chloride  0.9 % 100 mL IVPB  Status:  Discontinued        2 g 200 mL/hr over 30 Minutes Intravenous Every 24 hours 02/17/24 1709 02/17/24 1916   02/17/24 1430  vancomycin  (VANCOCIN ) IVPB 1000 mg/200 mL premix  Status:  Discontinued        1,000 mg 200 mL/hr over 60 Minutes Intravenous  Once 02/17/24 1421 02/17/24 1424   02/17/24 1430  vancomycin  (VANCOREADY) IVPB 2000 mg/400 mL        2,000 mg 200 mL/hr over 120 Minutes Intravenous  Once 02/17/24 1424 02/17/24 1700       Subjective: Seen and examined at bedside he is resting and not complaining of any pain today.  No  nausea or vomiting.  Feels okay.  No other concerns or complaints at this time.  Objective: Vitals:   02/24/24 0751 02/24/24 1133 02/24/24 1528 02/24/24 2000  BP: 128/69 (!) 106/58 (!) 130/58 (!) 160/66  Pulse: 91 90 87 96  Resp: 18 19 18 18   Temp: 97.8 F (36.6 C)  98 F (36.7 C) 98.9 F (37.2 C)  TempSrc:    Oral  SpO2: 92% 98% 95% 92%  Weight:      Height:        Intake/Output Summary (Last 24 hours) at 02/24/2024 2024 Last data filed at 02/24/2024 1755 Gross per 24 hour  Intake 460 ml  Output 1475 ml  Net -1015 ml   Filed Weights   02/17/24 1420 02/21/24 1215  Weight: 90.2 kg 90.2 kg   Examination: Physical Exam:  Constitutional: WN/WD overweight chronically ill-appearing Caucasian male no acute distress resting Respiratory: Diminished to auscultation bilaterally, no wheezing, rales, rhonchi or crackles. Normal respiratory effort and patient is not tachypenic. No accessory muscle use.  Unlabored breathing Cardiovascular: RRR, no murmurs / rubs / gallops. S1 and S2 auscultated.  Mild extremity edema.  Abdomen: Soft, non-tender, slightly distended secondary body habitus. Bowel sounds positive.  GU: Deferred. Musculoskeletal: Has a right  BKA Skin: No rashes, lesions, ulcers on a limited skin evaluation. No induration; Warm and dry.  Neurologic: CN 2-12 grossly intact with no focal deficits. Romberg sign and cerebellar reflexes not assessed.  Psychiatric: Normal judgment and insight. Alert and oriented x 3. Normal mood and appropriate affect.   Data Reviewed: I have personally reviewed following labs and imaging studies  CBC: Recent Labs  Lab 02/19/24 0241 02/20/24 0009 02/22/24 0216 02/23/24 0008 02/23/24 0535 02/24/24 0456  WBC 2.7* 2.6* 2.5* 3.0* 2.9* 3.5*  NEUTROABS 1.7  --   --   --   --  2.0  HGB 9.7* 8.7* 7.6* 7.5* 7.3* 8.9*  HCT 29.6* 26.0* 22.8* 23.1* 22.1* 26.7*  MCV 82.0 80.5 80.3 83.1 82.2 81.7  PLT 257 255 233 196 202 237   Basic Metabolic Panel: Recent Labs  Lab 02/20/24 0009 02/21/24 0515 02/22/24 0216 02/23/24 0535 02/24/24 0456  NA 136 137 137 137 133*  K 3.6 4.1 4.0 3.8 3.9  CL 104 106 108 108 102  CO2 22 19* 20* 21* 22  GLUCOSE 147* 115* 132* 179* 131*  BUN 54* 49* 45* 39* 44*  CREATININE 3.80* 3.49* 3.42* 3.46* 3.29*  CALCIUM  8.1* 8.0* 7.3* 7.1* 7.4*  MG 1.5* 1.7 1.5* 1.5* 1.9  PHOS 3.7  --   --   --  4.0   GFR: Estimated Creatinine Clearance: 23.6 mL/min (A) (by C-G formula based on SCr of 3.29 mg/dL (H)). Liver Function Tests: Recent Labs  Lab 02/24/24 0456  AST 23  ALT 6  ALKPHOS 43  BILITOT <0.2  PROT 5.3*  ALBUMIN 2.3*   No results for input(s): LIPASE, AMYLASE in the last 168 hours. No results for input(s): AMMONIA in the last 168 hours. Coagulation Profile: No results for input(s): INR, PROTIME in the last 168 hours. Cardiac Enzymes: Recent Labs  Lab 02/20/24 0009  CKTOTAL 17*   BNP (last 3 results) No results for input(s): PROBNP in the last 8760 hours. HbA1C: No results for input(s): HGBA1C in the last 72 hours. CBG: Recent Labs  Lab 02/24/24 0017 02/24/24 0414 02/24/24 0751 02/24/24 1133 02/24/24 1528  GLUCAP 131* 134* 127* 179*  161*   Lipid Profile: No results for input(s): CHOL, HDL,  LDLCALC, TRIG, CHOLHDL, LDLDIRECT in the last 72 hours. Thyroid  Function Tests: No results for input(s): TSH, T4TOTAL, FREET4, T3FREE, THYROIDAB in the last 72 hours. Anemia Panel: Recent Labs    02/23/24 2225 02/24/24 0456  VITAMINB12 536  --   FOLATE  --  7.3  FERRITIN  --  307  TIBC  --  203*  IRON  --  14*  RETICCTPCT  --  2.6   Sepsis Labs: Recent Labs  Lab 02/18/24 9075  LATICACIDVEN 1.4   Recent Results (from the past 240 hours)  Resp panel by RT-PCR (RSV, Flu A&B, Covid) Anterior Nasal Swab     Status: None   Collection Time: 02/17/24  2:21 PM   Specimen: Anterior Nasal Swab  Result Value Ref Range Status   SARS Coronavirus 2 by RT PCR NEGATIVE NEGATIVE Final    Comment: (NOTE) SARS-CoV-2 target nucleic acids are NOT DETECTED.  The SARS-CoV-2 RNA is generally detectable in upper respiratory specimens during the acute phase of infection. The lowest concentration of SARS-CoV-2 viral copies this assay can detect is 138 copies/mL. A negative result does not preclude SARS-Cov-2 infection and should not be used as the sole basis for treatment or other patient management decisions. A negative result may occur with  improper specimen collection/handling, submission of specimen other than nasopharyngeal swab, presence of viral mutation(s) within the areas targeted by this assay, and inadequate number of viral copies(<138 copies/mL). A negative result must be combined with clinical observations, patient history, and epidemiological information. The expected result is Negative.  Fact Sheet for Patients:  bloggercourse.com  Fact Sheet for Healthcare Providers:  seriousbroker.it  This test is no t yet approved or cleared by the [place]  FDA and  has been authorized for detection and/or diagnosis of SARS-CoV-2 by FDA under an Emergency  Use Authorization (EUA). This EUA will remain  in effect (meaning this test can be used) for the duration of the COVID-19 declaration under Section 564(b)(1) of the Act, 21 U.S.C.section 360bbb-3(b)(1), unless the authorization is terminated  or revoked sooner.       Influenza A by PCR NEGATIVE NEGATIVE Final   Influenza B by PCR NEGATIVE NEGATIVE Final    Comment: (NOTE) The Xpert Xpress SARS-CoV-2/FLU/RSV plus assay is intended as an aid in the diagnosis of influenza from Nasopharyngeal swab specimens and should not be used as a sole basis for treatment. Nasal washings and aspirates are unacceptable for Xpert Xpress SARS-CoV-2/FLU/RSV testing.  Fact Sheet for Patients: bloggercourse.com  Fact Sheet for Healthcare Providers: seriousbroker.it  This test is not yet approved or cleared by the [place]  FDA and has been authorized for detection and/or diagnosis of SARS-CoV-2 by FDA under an Emergency Use Authorization (EUA). This EUA will remain in effect (meaning this test can be used) for the duration of the COVID-19 declaration under Section 564(b)(1) of the Act, 21 U.S.C. section 360bbb-3(b)(1), unless the authorization is terminated or revoked.     Resp Syncytial Virus by PCR NEGATIVE NEGATIVE Final    Comment: (NOTE) Fact Sheet for Patients: bloggercourse.com  Fact Sheet for Healthcare Providers: seriousbroker.it  This test is not yet approved or cleared by the [place]  FDA and has been authorized for detection and/or diagnosis of SARS-CoV-2 by FDA under an Emergency Use Authorization (EUA). This EUA will remain in effect (meaning this test can be used) for the duration of the COVID-19 declaration under Section 564(b)(1) of the Act, 21 U.S.C. section 360bbb-3(b)(1), unless the authorization is terminated or revoked.  Performed at [facility], [address], [place], [place] [address]   Blood Culture (routine x 2)     Status: Abnormal   Collection Time: 02/17/24  2:48 PM   Specimen: BLOOD LEFT ARM  Result Value Ref Range Status   Specimen Description   Final    BLOOD LEFT ARM BOTTLES DRAWN AEROBIC AND ANAEROBIC Performed at [facility], [address] [place], [place] [address]    Special Requests   Final    Blood Culture adequate volume Performed at [facility], [address], [place], [place] [address]    Culture  Setup Time   Final    GRAM POSITIVE COCCI ANAEROBIC BOTTLE ONLY Gram Stain Report Called to,Read Back By and Verified With: C. ANTONE ON 02/18/2024 @10 :13AM BY T. HAMER  CRITICAL RESULT CALLED TO, READ BACK BY AND VERIFIED WITH: RN DOROTHYANN ANTONE [id number] AT 1351 BY EC Performed at [facility], [address], [place], [place] [address]    Culture METHICILLIN RESISTANT STAPHYLOCOCCUS AUREUS (A)  Final   Report Status 02/20/2024 FINAL  Final   Organism ID, Bacteria METHICILLIN RESISTANT STAPHYLOCOCCUS AUREUS  Final      Susceptibility   Methicillin resistant staphylococcus aureus - MIC*    CIPROFLOXACIN  >=8 RESISTANT Resistant     ERYTHROMYCIN <=0.25 SENSITIVE Sensitive     GENTAMICIN <=0.5 SENSITIVE Sensitive     OXACILLIN >=4 RESISTANT Resistant     TETRACYCLINE <=1 SENSITIVE Sensitive     VANCOMYCIN  1 SENSITIVE Sensitive     TRIMETH /SULFA  <=10 SENSITIVE Sensitive     CLINDAMYCIN <=0.25 SENSITIVE Sensitive     RIFAMPIN <=0.5 SENSITIVE Sensitive     Inducible Clindamycin NEGATIVE Sensitive     LINEZOLID  2 SENSITIVE Sensitive     * METHICILLIN RESISTANT STAPHYLOCOCCUS AUREUS  Blood Culture ID Panel (Reflexed)     Status: Abnormal   Collection Time: 02/17/24  2:48 PM  Result Value Ref Range Status   Enterococcus faecalis NOT DETECTED NOT DETECTED Final   Enterococcus Faecium NOT DETECTED NOT DETECTED Final   Listeria monocytogenes NOT DETECTED NOT DETECTED Final   Staphylococcus species DETECTED (A) NOT  DETECTED Final    Comment: CRITICAL RESULT CALLED TO, READ BACK BY AND VERIFIED WITH: RN DOROTHYANN ANTONE [id number] AT 1351 BY EC    Staphylococcus aureus (BCID) DETECTED (A) NOT DETECTED Final    Comment: Methicillin (oxacillin)-resistant Staphylococcus aureus (MRSA). MRSA is predictably resistant to beta-lactam antibiotics (except ceftaroline). Preferred therapy is vancomycin  unless clinically contraindicated. Patient requires contact precautions if  hospitalized. CRITICAL RESULT CALLED TO, READ BACK BY AND VERIFIED WITH: RN DOROTHYANN ANTONE [id number] AT 1351 BY EC    Staphylococcus epidermidis NOT DETECTED NOT DETECTED Final   Staphylococcus lugdunensis NOT DETECTED NOT DETECTED Final   Streptococcus species NOT DETECTED NOT DETECTED Final   Streptococcus agalactiae NOT DETECTED NOT DETECTED Final   Streptococcus pneumoniae NOT DETECTED NOT DETECTED Final   Streptococcus pyogenes NOT DETECTED NOT DETECTED Final   A.calcoaceticus-baumannii NOT DETECTED NOT DETECTED Final   Bacteroides fragilis NOT DETECTED NOT DETECTED Final   Enterobacterales NOT DETECTED NOT DETECTED Final   Enterobacter cloacae complex NOT DETECTED NOT DETECTED Final   Escherichia coli NOT DETECTED NOT DETECTED Final   Klebsiella aerogenes NOT DETECTED NOT DETECTED Final   Klebsiella oxytoca NOT DETECTED NOT DETECTED Final   Klebsiella pneumoniae NOT DETECTED NOT DETECTED Final   Proteus species NOT DETECTED NOT DETECTED Final   Salmonella species NOT DETECTED NOT DETECTED Final  Serratia marcescens NOT DETECTED NOT DETECTED Final   Haemophilus influenzae NOT DETECTED NOT DETECTED Final   Neisseria meningitidis NOT DETECTED NOT DETECTED Final   Pseudomonas aeruginosa NOT DETECTED NOT DETECTED Final   Stenotrophomonas maltophilia NOT DETECTED NOT DETECTED Final   Candida albicans NOT DETECTED NOT DETECTED Final   Candida auris NOT DETECTED NOT DETECTED Final   Candida glabrata NOT DETECTED NOT DETECTED Final    Candida krusei NOT DETECTED NOT DETECTED Final   Candida parapsilosis NOT DETECTED NOT DETECTED Final   Candida tropicalis NOT DETECTED NOT DETECTED Final   Cryptococcus neoformans/gattii NOT DETECTED NOT DETECTED Final   Meth resistant mecA/C and MREJ DETECTED (A) NOT DETECTED Final    Comment: CRITICAL RESULT CALLED TO, READ BACK BY AND VERIFIED WITH: RN DOROTHYANN IS[place] [id number] AT 1351 BY EC Performed at [facility], [address], [place], [place] [address]   MIC (1 Drug)-     Status: Abnormal   Collection Time: 02/17/24  2:48 PM  Result Value Ref Range Status   Min Inhibitory Conc (1 Drug) Final report (A)  Corrected    Comment: (NOTE) Performed At: [facility] [address] [place], [place] [id number] Jennette Shorter MD Ey:[id number] CORRECTED ON 01/10 AT 1335: PREVIOUSLY REPORTED AS Preliminary report    Source DAPTOMYCIN  MIC MRSA BLOOD CULTURE  Final    Comment: Performed at [facility], [address], [place], [place] [address]  MIC Result     Status: Abnormal   Collection Time: 02/17/24  2:48 PM  Result Value Ref Range Status   Result 1 (MIC) Comment (A)  Final    Comment: (NOTE) Methicillin - resistant Staphylococcus aureus Identification performed by account, not confirmed by this laboratory. Testing performed by broth microdilution. DAPTOMYCIN   <=0.25 ug/ML  SUSCEPTIBLE Performed At: [facility] [address] [place], [place] [id number] Jennette Shorter MD Ey:[id number]   Blood Culture (routine x 2)     Status: None   Collection Time: 02/17/24  2:50 PM   Specimen: BLOOD LEFT WRIST  Result Value Ref Range Status   Specimen Description   Final    BLOOD LEFT WRIST BOTTLES DRAWN AEROBIC AND ANAEROBIC   Special Requests Blood Culture adequate volume  Final   Culture   Final    NO GROWTH 5 DAYS Performed at [facility], [address], [place], [place] [address]    Report Status 02/22/2024 FINAL  Final  C Difficile Quick Screen  w PCR reflex     Status: Abnormal   Collection Time: 02/18/24  6:35 PM   Specimen: Stool  Result Value Ref Range Status   C Diff antigen POSITIVE (A) NEGATIVE Final   C Diff toxin NEGATIVE NEGATIVE Final   C Diff interpretation Results are indeterminate. See PCR results.  Final    Comment: Performed at [facility], [address], [place], [place] [address]  C. Diff by PCR, Reflexed     Status: None   Collection Time: 02/18/24  6:35 PM  Result Value Ref Range Status   Toxigenic C. Difficile by PCR NEGATIVE NEGATIVE Final    Comment: Patient is colonized with non toxigenic C. difficile. May not need treatment unless significant symptoms are present.   Hypervirulent Strain PRESUMPTIVE NEGATIVE PRESUMPTIVE NEGATIVE Final    Comment: Performed at [facility], [address], [place], [place] [address]  Gastrointestinal Panel by PCR , Stool     Status: None   Collection Time: 02/18/24  8:02 PM  Result Value Ref Range Status   Campylobacter species NOT DETECTED NOT DETECTED Final   Plesimonas shigelloides NOT DETECTED NOT DETECTED Final   Salmonella species NOT DETECTED NOT DETECTED Final   Yersinia enterocolitica NOT DETECTED NOT DETECTED Final   Vibrio species NOT DETECTED NOT DETECTED Final   Vibrio cholerae NOT DETECTED NOT DETECTED Final   Enteroaggregative E coli (EAEC) NOT DETECTED NOT DETECTED Final   Enteropathogenic E coli (EPEC) NOT DETECTED NOT DETECTED Final   Enterotoxigenic E coli (ETEC) NOT DETECTED NOT DETECTED Final   Shiga like toxin producing E coli (STEC) NOT DETECTED NOT DETECTED Final   Shigella/Enteroinvasive E coli (EIEC) NOT DETECTED NOT DETECTED Final   Cryptosporidium NOT DETECTED NOT DETECTED Final   Cyclospora cayetanensis NOT DETECTED NOT DETECTED Final   Entamoeba histolytica NOT DETECTED NOT DETECTED Final   Giardia lamblia NOT DETECTED NOT DETECTED Final   Adenovirus F40/41 NOT DETECTED NOT DETECTED Final   Astrovirus NOT DETECTED NOT DETECTED  Final   Norovirus GI/GII NOT DETECTED NOT DETECTED Final   Rotavirus A NOT DETECTED NOT DETECTED Final   Sapovirus (I, II, IV, and V) NOT DETECTED NOT DETECTED Final    Comment: Performed at [facility], [address] [place], [place] [address]  Respiratory (~20 pathogens) panel by PCR     Status: None   Collection Time: 02/19/24 12:58 AM   Specimen: Nasopharyngeal Swab; Respiratory  Result Value Ref Range Status   Adenovirus NOT DETECTED NOT DETECTED Final   Coronavirus 229E NOT DETECTED NOT DETECTED Final    Comment: (NOTE) The Coronavirus on the Respiratory Panel, DOES NOT test for the novel  Coronavirus (2019 nCoV)    Coronavirus HKU1 NOT DETECTED NOT DETECTED Final   Coronavirus NL63 NOT DETECTED NOT DETECTED Final   Coronavirus OC43 NOT DETECTED NOT DETECTED Final   Metapneumovirus NOT DETECTED NOT DETECTED Final   Rhinovirus / Enterovirus NOT DETECTED NOT DETECTED Final   Influenza A NOT DETECTED NOT DETECTED Final   Influenza B NOT DETECTED NOT DETECTED Final   Parainfluenza Virus 1 NOT DETECTED NOT DETECTED Final   Parainfluenza Virus 2 NOT DETECTED NOT DETECTED Final   Parainfluenza Virus 3 NOT DETECTED NOT DETECTED Final   Parainfluenza Virus 4 NOT DETECTED NOT DETECTED Final   Respiratory Syncytial Virus NOT DETECTED NOT DETECTED Final   Bordetella pertussis NOT DETECTED NOT DETECTED Final   Bordetella Parapertussis NOT DETECTED NOT DETECTED Final   Chlamydophila pneumoniae NOT DETECTED NOT DETECTED Final   Mycoplasma pneumoniae NOT DETECTED NOT DETECTED Final    Comment: Performed at [facility], [address] [place], [place] [address]  Culture, blood (Routine X 2) w Reflex to ID Panel     Status: None   Collection Time: 02/19/24  2:39 AM   Specimen: BLOOD RIGHT HAND  Result Value Ref Range Status   Specimen Description BLOOD RIGHT HAND  Final   Special Requests   Final    BOTTLES DRAWN AEROBIC AND ANAEROBIC Blood Culture adequate volume  IMMUNOCOMPROMISED   Culture   Final    NO GROWTH 5 DAYS Performed at [facility], [address] [place], [place] [address]    Report Status 02/24/2024 FINAL  Final  Culture, blood (Routine X 2) w Reflex to ID Panel     Status: None   Collection Time: 02/19/24  2:39 AM   Specimen: BLOOD RIGHT ARM  Result Value Ref Range Status   Specimen Description  BLOOD RIGHT ARM  Final   Special Requests   Final    Immunocompromised BOTTLES DRAWN AEROBIC AND ANAEROBIC Blood Culture adequate volume   Culture   Final    NO GROWTH 5 DAYS Performed at [facility], [address] [place], [place] [address]    Report Status 02/24/2024 FINAL  Final  Urine Culture     Status: None   Collection Time: 02/19/24  6:32 AM   Specimen: Urine, Random  Result Value Ref Range Status   Specimen Description URINE, RANDOM  Final   Special Requests URINE, CLEAN CATCH  Final   Culture   Final    NO GROWTH Performed at [facility], [address] [place], [place] [address]    Report Status 02/20/2024 FINAL  Final  Surgical pcr screen     Status: Abnormal   Collection Time: 02/20/24  2:18 PM   Specimen: Nasal Mucosa; Nasal Swab  Result Value Ref Range Status   MRSA, PCR POSITIVE (A) NEGATIVE Final    Comment: RESULT CALLED TO, READ BACK BY AND VERIFIED WITH: RN CANDIE SHUCK [phone] @ [phone]    Staphylococcus aureus POSITIVE (A) NEGATIVE Final    Comment: (NOTE) The Xpert SA Assay (FDA approved for NASAL specimens in patients 70 years of age and older), is one component of a comprehensive surveillance program. It is not intended to diagnose infection nor to guide or monitor treatment. Performed at [facility], [address] [place], [place] [address]     Radiology Studies: DG CHEST PORT 1 VIEW Result Date: 02/23/2024 EXAM: 1 VIEW(S) XRAY OF THE CHEST 02/23/2024 09:27:00 AM COMPARISON: 02/17/2024 CLINICAL HISTORY: Cough; Fever FINDINGS: LUNGS AND PLEURA: New left lower lung  patchy opacities. No pleural effusion. No pneumothorax. HEART AND MEDIASTINUM: No acute abnormality of the cardiac and mediastinal silhouettes. BONES AND SOFT TISSUES: No acute osseous abnormality. IMPRESSION: 1. Patchy left lower lobe opacity, possibly representing atelectasis and/or infection. Electronically signed by: Michaeline Blanch MD 02/23/2024 04:23 PM EST RP Workstation: [id number]   Scheduled Meds:  acidophilus  1 capsule Oral Daily   ascorbic acid   1,000 mg Oral Daily   benzonatate   100 mg Oral TID   Chlorhexidine  Gluconate Cloth  6 each Topical Daily   docusate sodium   100 mg Oral Daily   ferrous sulfate   325 mg Oral TID WC   heparin   5,000 Units Subcutaneous Q8H   [START ON 02/25/2024] insulin  aspart  0-15 Units Subcutaneous TID WC   insulin  aspart  0-5 Units Subcutaneous QHS   insulin  aspart  3 Units Subcutaneous TID WC   insulin  glargine-yfgn  10 Units Subcutaneous QHS   [START ON 02/26/2024] linezolid   600 mg Oral Q12H   metoprolol  succinate  50 mg Oral Daily   mupirocin  ointment  1 Application Nasal BID   nutrition supplement (JUVEN)  1 packet Oral BID BM   sodium chloride  flush  3 mL Intravenous Q12H   Vitamin D  (Ergocalciferol )  50,000 Units Oral QHS   zinc  sulfate (50mg  elemental zinc )  220 mg Oral Daily   Continuous Infusions:  DAPTOmycin  Stopped (02/23/24 1820)    LOS: 7 days   Alejandro Marker, DO [facility] Available via Epic secure chat 7am-7pm After these hours, please refer to coverage provider listed on [email or web address] 02/24/2024, 8:24 PM  "

## 2024-02-25 DIAGNOSIS — L03115 Cellulitis of right lower limb: Secondary | ICD-10-CM | POA: Diagnosis not present

## 2024-02-25 DIAGNOSIS — E11628 Type 2 diabetes mellitus with other skin complications: Secondary | ICD-10-CM | POA: Diagnosis not present

## 2024-02-25 DIAGNOSIS — K529 Noninfective gastroenteritis and colitis, unspecified: Secondary | ICD-10-CM | POA: Diagnosis not present

## 2024-02-25 DIAGNOSIS — R7881 Bacteremia: Secondary | ICD-10-CM | POA: Diagnosis not present

## 2024-02-25 LAB — COMPREHENSIVE METABOLIC PANEL WITH GFR
ALT: 8 U/L (ref 0–44)
AST: 21 U/L (ref 15–41)
Albumin: 2.3 g/dL — ABNORMAL LOW (ref 3.5–5.0)
Alkaline Phosphatase: 43 U/L (ref 38–126)
Anion gap: 9 (ref 5–15)
BUN: 56 mg/dL — ABNORMAL HIGH (ref 8–23)
CO2: 22 mmol/L (ref 22–32)
Calcium: 7.4 mg/dL — ABNORMAL LOW (ref 8.9–10.3)
Chloride: 104 mmol/L (ref 98–111)
Creatinine, Ser: 3.56 mg/dL — ABNORMAL HIGH (ref 0.61–1.24)
GFR, Estimated: 17 mL/min — ABNORMAL LOW
Glucose, Bld: 109 mg/dL — ABNORMAL HIGH (ref 70–99)
Potassium: 4.4 mmol/L (ref 3.5–5.1)
Sodium: 135 mmol/L (ref 135–145)
Total Bilirubin: 0.2 mg/dL (ref 0.0–1.2)
Total Protein: 5.2 g/dL — ABNORMAL LOW (ref 6.5–8.1)

## 2024-02-25 LAB — GLUCOSE, CAPILLARY
Glucose-Capillary: 111 mg/dL — ABNORMAL HIGH (ref 70–99)
Glucose-Capillary: 148 mg/dL — ABNORMAL HIGH (ref 70–99)
Glucose-Capillary: 234 mg/dL — ABNORMAL HIGH (ref 70–99)
Glucose-Capillary: 318 mg/dL — ABNORMAL HIGH (ref 70–99)
Glucose-Capillary: 379 mg/dL — ABNORMAL HIGH (ref 70–99)

## 2024-02-25 LAB — CBC WITH DIFFERENTIAL/PLATELET
Abs Immature Granulocytes: 0.08 K/uL — ABNORMAL HIGH (ref 0.00–0.07)
Basophils Absolute: 0 K/uL (ref 0.0–0.1)
Basophils Relative: 0 %
Eosinophils Absolute: 0 K/uL (ref 0.0–0.5)
Eosinophils Relative: 1 %
HCT: 25.2 % — ABNORMAL LOW (ref 39.0–52.0)
Hemoglobin: 8.4 g/dL — ABNORMAL LOW (ref 13.0–17.0)
Immature Granulocytes: 1 %
Lymphocytes Relative: 20 %
Lymphs Abs: 1.1 K/uL (ref 0.7–4.0)
MCH: 27.2 pg (ref 26.0–34.0)
MCHC: 33.3 g/dL (ref 30.0–36.0)
MCV: 81.6 fL (ref 80.0–100.0)
Monocytes Absolute: 0.2 K/uL (ref 0.1–1.0)
Monocytes Relative: 4 %
Neutro Abs: 4.1 K/uL (ref 1.7–7.7)
Neutrophils Relative %: 74 %
Platelets: 245 K/uL (ref 150–400)
RBC: 3.09 MIL/uL — ABNORMAL LOW (ref 4.22–5.81)
RDW: 16.3 % — ABNORMAL HIGH (ref 11.5–15.5)
WBC: 5.6 K/uL (ref 4.0–10.5)
nRBC: 0 % (ref 0.0–0.2)

## 2024-02-25 LAB — PHOSPHORUS: Phosphorus: 3.6 mg/dL (ref 2.5–4.6)

## 2024-02-25 LAB — MAGNESIUM: Magnesium: 1.9 mg/dL (ref 1.7–2.4)

## 2024-02-25 MED ORDER — DIPHENHYDRAMINE HCL 25 MG PO CAPS
25.0000 mg | ORAL_CAPSULE | Freq: Four times a day (QID) | ORAL | Status: DC | PRN
  Administered 2024-02-25 – 2024-02-26 (×3): 25 mg via ORAL
  Filled 2024-02-25 (×3): qty 1

## 2024-02-25 MED ORDER — METHYLPREDNISOLONE SODIUM SUCC 125 MG IJ SOLR
125.0000 mg | Freq: Once | INTRAMUSCULAR | Status: AC
  Administered 2024-02-25: 125 mg via INTRAVENOUS
  Filled 2024-02-25: qty 2

## 2024-02-25 MED ORDER — FUROSEMIDE 10 MG/ML IJ SOLN
40.0000 mg | Freq: Once | INTRAMUSCULAR | Status: AC
  Administered 2024-02-25: 40 mg via INTRAVENOUS
  Filled 2024-02-25: qty 4

## 2024-02-25 NOTE — Plan of Care (Signed)
   Problem: Education: Goal: Knowledge of General Education information will improve Description Including pain rating scale, medication(s)/side effects and non-pharmacologic comfort measures Outcome: Progressing

## 2024-02-25 NOTE — Plan of Care (Signed)

## 2024-02-25 NOTE — Progress Notes (Signed)
 " PROGRESS NOTE    [name]  FMW:[id number] DOB: 07/21/1950 DOA: 02/17/2024 PCP: Marvine Rush, MD   Brief Narrative:  The patient is a 74 year old with history of CVA with residual slurred speech, CKD 3 AA, chronic osteomyelitis s/p right TMA, HTN, DM 2 admitted to the hospital for generally feeling sick and having nausea/vomiting.  Workup showed concerns of sepsis with worsening right diabetic foot infection with abscess.  Dr. Harden was consulted and patient was transferred from [facility] to [facility] for surgical intervention.  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  Hospital course complicated by gram-positive bacteremia, echocardiogram unremarkable. Underwent TEE and showed no Vegetation. ID recommending transitioning to po Abx on 1/12 for 3 more weeks.   Assessment and Plan:  Sepsis secondary to right diabetic foot infection associated with abscess and osteomyelitis Gram-positive bacteremia -Orthopedic consulted.  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  Postop recommendations by their service. -ID team was following -Repeat blood cultures 1/5 - NGTD -Having intermittent Temperatures and had another Temperature this AM; WBC is not elevated.  -Echocardiogram showing preserved EF 65%, grade 1 DD.  No evidence of vegetation -TEE done and showed no Vegetation -ID recommends continuing IV Daptomycin  and then transition to linezolid  on 1/12 to finish 3 more weeks with end date being 03/18/2024 -CXR done and showed Patchy left lower lobe opacity, possibly representing atelectasis and/or infection. Will Repeat in the AM -PT/OT recommending SNF and likely will be Monday as we will transition him from IV antibiotics to oral antibiotics at that time  Normocytic Anemia: Combination of delusional and likely postsurgical blood loss.  Closely monitor.  Transfuse hemoglobin less than 7. Hgb/Hct Trend:  Recent Labs  Lab 02/19/24 0241 02/20/24 0009 02/22/24 0216 02/23/24 0008 02/23/24 0535  02/24/24 0456 02/25/24 0541  HGB 9.7* 8.7* 7.6* 7.5* 7.3* 8.9* 8.4*  HCT 29.6* 26.0* 22.8* 23.1* 22.1* 26.7* 25.2*  MCV 82.0 80.5 80.3 83.1 82.2 81.7 81.6  -Checked Anemia Panel and showed Iron level 14, UIBC 189, TIBC 203, saturation ration 7%, ferritin level 307, folate level 7.3. CTM for S/Sx of Bleeding; No overt bleeding noted. Repeat CBC in the AM    Type 2 Diabetes Mellitus with neurological complications, uncontrolled with Hyperglycemia -A1c 9.8.  Sliding scale and Accu-Chek.  Will adjust as necessary. CBG Trend:  Recent Labs  Lab 02/24/24 1133 02/24/24 1528 02/24/24 2126 02/25/24 0751 02/25/24 1133 02/25/24 1544 02/25/24 1943  GLUCAP 179* 161* 135* 111* 148* 234* 318*    AKI on CKD stage 3b / Metabolic Acidosis: Baseline creatinine around 2.0, admission creatinine 4.82 with IV fluids renal function is slowly improving, creatinine 3.42. BUN/Cr Trend: Recent Labs  Lab 02/19/24 0241 02/20/24 0009 02/21/24 0515 02/22/24 0216 02/23/24 0535 02/24/24 0456 02/25/24 0541  BUN 52* 54* 49* 45* 39* 44* 56*  CREATININE 4.33* 3.80* 3.49* 3.42* 3.46* 3.29* 3.56*  -Now improved and CO2 is 22, AG is 9, Chloride Level is 104  -Renal ultrasound does not show acute pathology; Given a dose of IV Lasix  40 mg x1 the day before yesterday and will give him another dose  -Avoid Nephrotoxic Medications, Contrast Dyes, Hypotension and Dehydration to Ensure Adequate Renal Perfusion and will need to Renally Adjust Meds. CTM & Trend Renal Function carefully and repeat CMP in the AM    Gastroenteritis:  Improved. C. difficile antigen positive per toxigenic PCR is negative.  GI panel is neg. CTM for S/Sx     Diffuse Macular Rash: Unclear etiology.  Patient  thinks it is getting worse.  Will try Solu-Medrol  1.5 mg IV once.  Add Benadryl .  Will need outpatient follow-up with dermatology   Essential Hypertension  Sinus Tachycardia -Toprol -XL IV as needed. CTM BP per Protocol. Last BP reading was  169/73  Hypomagnesemia/Hypokalemia: K+ and Mag Level Trend:  Recent Labs  Lab 02/19/24 0241 02/20/24 0009 02/20/24 0009 02/21/24 0515 02/22/24 0216 02/23/24 0535 02/24/24 0456 02/25/24 0541  K 3.4* 3.6  --  4.1 4.0 3.8 3.9 4.4  MG  --  1.5*   < > 1.7 1.5* 1.5* 1.9 1.9   < > = values in this interval not displayed.  -Replete w/ IV Mag Sulfate 2 grams. CTM and Replete as Necessary. Repeat CMP and Mag in the AM   Hypoalbuminemia: Patient's Albumin Lvl 3.6 -> 2.3 x2. CTM & Trend a& Repeat CMP in the AM    DVT prophylaxis: heparin  injection 5,000 Units Start: 02/22/24 0600 SCD's Start: 02/21/24 1629    Code Status: Full Code Family Communication: No family present @ beside  Disposition Plan:  Level of care: Telemetry Status is: Inpatient Remains inpatient appropriate because: Needs SNF, Insurance Auth, and improvement in his condition   Consultants:  ID Orthopedic Surgery  Procedures:  As delineated as above   Antimicrobials:  Anti-infectives (From admission, onward)    Start     Dose/Rate Route Frequency Ordered Stop   02/26/24 1000  linezolid  (ZYVOX ) tablet 600 mg        600 mg Oral Every 12 hours 02/23/24 1650 03/19/24 0959   02/21/24 1650  DAPTOmycin  (CUBICIN ) IVPB 700 mg/126mL premix        8 mg/kg  90.2 kg 200 mL/hr over 30 Minutes Intravenous Every 48 hours 02/21/24 1650 02/25/24 1708   02/21/24 1153  ceFAZolin  (ANCEF ) 2-4 GM/100ML-% IVPB       Note to Pharmacy: Jonda Bottoms M: cabinet override      02/21/24 1153 02/21/24 2359   02/20/24 0930  ceFAZolin  (ANCEF ) IVPB 2g/100 mL premix  Status:  Discontinued        2 g 200 mL/hr over 30 Minutes Intravenous On call to O.R. 02/20/24 0927 02/21/24 0559   02/19/24 1215  DAPTOmycin  (CUBICIN ) IVPB 700 mg/162mL premix  Status:  Discontinued        8 mg/kg  90.2 kg 200 mL/hr over 30 Minutes Intravenous Every 48 hours 02/19/24 1201 02/21/24 1650   02/18/24 1000  linezolid  (ZYVOX ) IVPB 600 mg  Status:  Discontinued         600 mg 300 mL/hr over 60 Minutes Intravenous Every 12 hours 02/17/24 1912 02/19/24 1159   02/18/24 0800  Ampicillin -Sulbactam (UNASYN ) 3 g in sodium chloride  0.9 % 100 mL IVPB  Status:  Discontinued        3 g 200 mL/hr over 30 Minutes Intravenous Every 12 hours 02/17/24 1912 02/22/24 1014   02/17/24 1715  cefTRIAXone  (ROCEPHIN ) 2 g in sodium chloride  0.9 % 100 mL IVPB  Status:  Discontinued        2 g 200 mL/hr over 30 Minutes Intravenous Every 24 hours 02/17/24 1709 02/17/24 1916   02/17/24 1430  vancomycin  (VANCOCIN ) IVPB 1000 mg/200 mL premix  Status:  Discontinued        1,000 mg 200 mL/hr over 60 Minutes Intravenous  Once 02/17/24 1421 02/17/24 1424   02/17/24 1430  vancomycin  (VANCOREADY) IVPB 2000 mg/400 mL        2,000 mg 200 mL/hr over 120 Minutes Intravenous  Once  02/17/24 1424 02/17/24 1700       Subjective: Seen and examined at bedside and was resting.  Had a fever this morning.  Still fatigued.  States that he thinks his rash is getting little worse.  No other concerns or complaints at this time.  Objective: Vitals:   02/25/24 0756 02/25/24 1134 02/25/24 1544 02/25/24 1946  BP: (!) 142/64 139/64 (!) 143/69 (!) 169/73  Pulse: (!) 101 97 88 88  Resp: 18 19 18 18   Temp: 100.2 F (37.9 C) 99.2 F (37.3 C) 98.7 F (37.1 C) 98.1 F (36.7 C)  TempSrc: Oral  Oral   SpO2: 91% 93% 93% 93%  Weight:      Height:        Intake/Output Summary (Last 24 hours) at 02/25/2024 2032 Last data filed at 02/25/2024 1805 Gross per 24 hour  Intake 460 ml  Output 3000 ml  Net -2540 ml   Filed Weights   02/17/24 1420 02/21/24 1215  Weight: 90.2 kg 90.2 kg   Examination: Physical Exam:  Constitutional: WN/WD overweight chronically ill-appearing elderly Caucasian male who appears calm Respiratory: Diminished to auscultation bilaterally, no wheezing, rales, rhonchi or crackles. Normal respiratory effort and patient is not tachypenic. No accessory muscle use.  Unlabored  breathing Cardiovascular: RRR, no murmurs / rubs / gallops. S1 and S2 auscultated.  Slight extremity edema. Abdomen: Soft, non-tender, slightly distended secondary body habitus. Bowel sounds positive.  GU: Deferred. Musculoskeletal: Has a right BKA Skin: Diffuse macular rash on his extremities and on the lateral side of his legs and arms Neurologic: CN 2-12 grossly intact with no focal deficits.  Psychiatric: Appears calm awake and alert  Data Reviewed: I have personally reviewed following labs and imaging studies  CBC: Recent Labs  Lab 02/19/24 0241 02/20/24 0009 02/22/24 0216 02/23/24 0008 02/23/24 0535 02/24/24 0456 02/25/24 0541  WBC 2.7*   < > 2.5* 3.0* 2.9* 3.5* 5.6  NEUTROABS 1.7  --   --   --   --  2.0 4.1  HGB 9.7*   < > 7.6* 7.5* 7.3* 8.9* 8.4*  HCT 29.6*   < > 22.8* 23.1* 22.1* 26.7* 25.2*  MCV 82.0   < > 80.3 83.1 82.2 81.7 81.6  PLT 257   < > 233 196 202 237 245   < > = values in this interval not displayed.   Basic Metabolic Panel: Recent Labs  Lab 02/20/24 0009 02/21/24 0515 02/22/24 0216 02/23/24 0535 02/24/24 0456 02/25/24 0541  NA 136 137 137 137 133* 135  K 3.6 4.1 4.0 3.8 3.9 4.4  CL 104 106 108 108 102 104  CO2 22 19* 20* 21* 22 22  GLUCOSE 147* 115* 132* 179* 131* 109*  BUN 54* 49* 45* 39* 44* 56*  CREATININE 3.80* 3.49* 3.42* 3.46* 3.29* 3.56*  CALCIUM  8.1* 8.0* 7.3* 7.1* 7.4* 7.4*  MG 1.5* 1.7 1.5* 1.5* 1.9 1.9  PHOS 3.7  --   --   --  4.0 3.6   GFR: Estimated Creatinine Clearance: 21.8 mL/min (A) (by C-G formula based on SCr of 3.56 mg/dL (H)). Liver Function Tests: Recent Labs  Lab 02/24/24 0456 02/25/24 0541  AST 23 21  ALT 6 8  ALKPHOS 43 43  BILITOT <0.2 0.2  PROT 5.3* 5.2*  ALBUMIN 2.3* 2.3*   No results for input(s): LIPASE, AMYLASE in the last 168 hours. No results for input(s): AMMONIA in the last 168 hours. Coagulation Profile: No results for input(s): INR, PROTIME in the  last 168 hours. Cardiac  Enzymes: Recent Labs  Lab 02/20/24 0009  CKTOTAL 17*   BNP (last 3 results) No results for input(s): PROBNP in the last 8760 hours. HbA1C: No results for input(s): HGBA1C in the last 72 hours. CBG: Recent Labs  Lab 02/25/24 0751 02/25/24 1133 02/25/24 1544 02/25/24 1943 02/25/24 2025  GLUCAP 111* 148* 234* 318* 379*   Lipid Profile: No results for input(s): CHOL, HDL, LDLCALC, TRIG, CHOLHDL, LDLDIRECT in the last 72 hours. Thyroid  Function Tests: No results for input(s): TSH, T4TOTAL, FREET4, T3FREE, THYROIDAB in the last 72 hours. Anemia Panel: Recent Labs    02/23/24 2225 02/24/24 0456  VITAMINB12 536  --   FOLATE  --  7.3  FERRITIN  --  307  TIBC  --  203*  IRON  --  14*  RETICCTPCT  --  2.6   Sepsis Labs: No results for input(s): PROCALCITON, LATICACIDVEN in the last 168 hours.  Recent Results (from the past 240 hours)  Resp panel by RT-PCR (RSV, Flu A&B, Covid) Anterior Nasal Swab     Status: None   Collection Time: 02/17/24  2:21 PM   Specimen: Anterior Nasal Swab  Result Value Ref Range Status   SARS Coronavirus 2 by RT PCR NEGATIVE NEGATIVE Final    Comment: (NOTE) SARS-CoV-2 target nucleic acids are NOT DETECTED.  The SARS-CoV-2 RNA is generally detectable in upper respiratory specimens during the acute phase of infection. The lowest concentration of SARS-CoV-2 viral copies this assay can detect is 138 copies/mL. A negative result does not preclude SARS-Cov-2 infection and should not be used as the sole basis for treatment or other patient management decisions. A negative result may occur with  improper specimen collection/handling, submission of specimen other than nasopharyngeal swab, presence of viral mutation(s) within the areas targeted by this assay, and inadequate number of viral copies(<138 copies/mL). A negative result must be combined with clinical observations, patient history, and  epidemiological information. The expected result is Negative.  Fact Sheet for Patients:  bloggercourse.com  Fact Sheet for Healthcare Providers:  seriousbroker.it  This test is no t yet approved or cleared by the [place]  FDA and  has been authorized for detection and/or diagnosis of SARS-CoV-2 by FDA under an Emergency Use Authorization (EUA). This EUA will remain  in effect (meaning this test can be used) for the duration of the COVID-19 declaration under Section 564(b)(1) of the Act, 21 U.S.C.section 360bbb-3(b)(1), unless the authorization is terminated  or revoked sooner.       Influenza A by PCR NEGATIVE NEGATIVE Final   Influenza B by PCR NEGATIVE NEGATIVE Final    Comment: (NOTE) The Xpert Xpress SARS-CoV-2/FLU/RSV plus assay is intended as an aid in the diagnosis of influenza from Nasopharyngeal swab specimens and should not be used as a sole basis for treatment. Nasal washings and aspirates are unacceptable for Xpert Xpress SARS-CoV-2/FLU/RSV testing.  Fact Sheet for Patients: bloggercourse.com  Fact Sheet for Healthcare Providers: seriousbroker.it  This test is not yet approved or cleared by the [place]  FDA and has been authorized for detection and/or diagnosis of SARS-CoV-2 by FDA under an Emergency Use Authorization (EUA). This EUA will remain in effect (meaning this test can be used) for the duration of the COVID-19 declaration under Section 564(b)(1) of the Act, 21 U.S.C. section 360bbb-3(b)(1), unless the authorization is terminated or revoked.     Resp Syncytial Virus by PCR NEGATIVE NEGATIVE Final    Comment: (NOTE) Fact Sheet for Patients: bloggercourse.com  Fact Sheet for Healthcare Providers: seriousbroker.it  This test is not yet approved or cleared by the [place]  FDA and has been  authorized for detection and/or diagnosis of SARS-CoV-2 by FDA under an Emergency Use Authorization (EUA). This EUA will remain in effect (meaning this test can be used) for the duration of the COVID-19 declaration under Section 564(b)(1) of the Act, 21 U.S.C. section 360bbb-3(b)(1), unless the authorization is terminated or revoked.  Performed at [facility], [address] [place], [place] [address]   Blood Culture (routine x 2)     Status: Abnormal   Collection Time: 02/17/24  2:48 PM   Specimen: BLOOD LEFT ARM  Result Value Ref Range Status   Specimen Description   Final    BLOOD LEFT ARM BOTTLES DRAWN AEROBIC AND ANAEROBIC Performed at [facility], [address], [place], [place] [address]    Special Requests   Final    Blood Culture adequate volume Performed at [facility], [address], [place], [place] [address]    Culture  Setup Time   Final    GRAM POSITIVE COCCI ANAEROBIC BOTTLE ONLY Gram Stain Report Called to,Read Back By and Verified With: C. ANTONE ON 02/18/2024 @10 :13AM BY T. HAMER  CRITICAL RESULT CALLED TO, READ BACK BY AND VERIFIED WITH: RN DOROTHYANN ANTONE [id number] AT 1351 BY EC Performed at [facility], [address], [place], [place] [address]    Culture METHICILLIN RESISTANT STAPHYLOCOCCUS AUREUS (A)  Final   Report Status 02/20/2024 FINAL  Final   Organism ID, Bacteria METHICILLIN RESISTANT STAPHYLOCOCCUS AUREUS  Final      Susceptibility   Methicillin resistant staphylococcus aureus - MIC*    CIPROFLOXACIN  >=8 RESISTANT Resistant     ERYTHROMYCIN <=0.25 SENSITIVE Sensitive     GENTAMICIN <=0.5 SENSITIVE Sensitive     OXACILLIN >=4 RESISTANT Resistant     TETRACYCLINE <=1 SENSITIVE Sensitive     VANCOMYCIN  1 SENSITIVE Sensitive     TRIMETH /SULFA  <=10 SENSITIVE Sensitive     CLINDAMYCIN <=0.25 SENSITIVE Sensitive     RIFAMPIN <=0.5 SENSITIVE Sensitive     Inducible Clindamycin NEGATIVE Sensitive     LINEZOLID  2 SENSITIVE Sensitive     *  METHICILLIN RESISTANT STAPHYLOCOCCUS AUREUS  Blood Culture ID Panel (Reflexed)     Status: Abnormal   Collection Time: 02/17/24  2:48 PM  Result Value Ref Range Status   Enterococcus faecalis NOT DETECTED NOT DETECTED Final   Enterococcus Faecium NOT DETECTED NOT DETECTED Final   Listeria monocytogenes NOT DETECTED NOT DETECTED Final   Staphylococcus species DETECTED (A) NOT DETECTED Final    Comment: CRITICAL RESULT CALLED TO, READ BACK BY AND VERIFIED WITH: RN DOROTHYANN ANTONE [id number] AT 1351 BY EC    Staphylococcus aureus (BCID) DETECTED (A) NOT DETECTED Final    Comment: Methicillin (oxacillin)-resistant Staphylococcus aureus (MRSA). MRSA is predictably resistant to beta-lactam antibiotics (except ceftaroline). Preferred therapy is vancomycin  unless clinically contraindicated. Patient requires contact precautions if  hospitalized. CRITICAL RESULT CALLED TO, READ BACK BY AND VERIFIED WITH: RN DOROTHYANN ANTONE [id number] AT 1351 BY EC    Staphylococcus epidermidis NOT DETECTED NOT DETECTED Final   Staphylococcus lugdunensis NOT DETECTED NOT DETECTED Final   Streptococcus species NOT DETECTED NOT DETECTED Final   Streptococcus agalactiae NOT DETECTED NOT DETECTED Final   Streptococcus pneumoniae NOT DETECTED NOT DETECTED Final   Streptococcus pyogenes NOT DETECTED NOT DETECTED Final   A.calcoaceticus-baumannii NOT DETECTED NOT DETECTED Final   Bacteroides fragilis NOT DETECTED NOT  DETECTED Final   Enterobacterales NOT DETECTED NOT DETECTED Final   Enterobacter cloacae complex NOT DETECTED NOT DETECTED Final   Escherichia coli NOT DETECTED NOT DETECTED Final   Klebsiella aerogenes NOT DETECTED NOT DETECTED Final   Klebsiella oxytoca NOT DETECTED NOT DETECTED Final   Klebsiella pneumoniae NOT DETECTED NOT DETECTED Final   Proteus species NOT DETECTED NOT DETECTED Final   Salmonella species NOT DETECTED NOT DETECTED Final   Serratia marcescens NOT DETECTED NOT DETECTED Final    Haemophilus influenzae NOT DETECTED NOT DETECTED Final   Neisseria meningitidis NOT DETECTED NOT DETECTED Final   Pseudomonas aeruginosa NOT DETECTED NOT DETECTED Final   Stenotrophomonas maltophilia NOT DETECTED NOT DETECTED Final   Candida albicans NOT DETECTED NOT DETECTED Final   Candida auris NOT DETECTED NOT DETECTED Final   Candida glabrata NOT DETECTED NOT DETECTED Final   Candida krusei NOT DETECTED NOT DETECTED Final   Candida parapsilosis NOT DETECTED NOT DETECTED Final   Candida tropicalis NOT DETECTED NOT DETECTED Final   Cryptococcus neoformans/gattii NOT DETECTED NOT DETECTED Final   Meth resistant mecA/C and MREJ DETECTED (A) NOT DETECTED Final    Comment: CRITICAL RESULT CALLED TO, READ BACK BY AND VERIFIED WITH: RN DOROTHYANN IS[place] [id number] AT 1351 BY EC Performed at [facility], [address] [place], [place] [address]   MIC (1 Drug)-     Status: Abnormal   Collection Time: 02/17/24  2:48 PM  Result Value Ref Range Status   Min Inhibitory Conc (1 Drug) Final report (A)  Corrected    Comment: (NOTE) Performed At: [facility] [address] [place], [place] [id number] Jennette Shorter MD Ey:[id number] CORRECTED ON 01/10 AT 1335: PREVIOUSLY REPORTED AS Preliminary report    Source DAPTOMYCIN  MIC MRSA BLOOD CULTURE  Final    Comment: Performed at [facility], [address], [place], [place] [address]  MIC Result     Status: Abnormal   Collection Time: 02/17/24  2:48 PM  Result Value Ref Range Status   Result 1 (MIC) Comment (A)  Final    Comment: (NOTE) Methicillin - resistant Staphylococcus aureus Identification performed by account, not confirmed by this laboratory. Testing performed by broth microdilution. DAPTOMYCIN   <=0.25 ug/ML  SUSCEPTIBLE Performed At: [facility] [address] [place], [place] [id number] Jennette Shorter MD Ey:[id number]   Blood Culture (routine x 2)     Status: None   Collection Time: 02/17/24  2:50  PM   Specimen: BLOOD LEFT WRIST  Result Value Ref Range Status   Specimen Description   Final    BLOOD LEFT WRIST BOTTLES DRAWN AEROBIC AND ANAEROBIC   Special Requests Blood Culture adequate volume  Final   Culture   Final    NO GROWTH 5 DAYS Performed at [facility], [address] [place], [place] [address]    Report Status 02/22/2024 FINAL  Final  C Difficile Quick Screen w PCR reflex     Status: Abnormal   Collection Time: 02/18/24  6:35 PM   Specimen: Stool  Result Value Ref Range Status   C Diff antigen POSITIVE (A) NEGATIVE Final   C Diff toxin NEGATIVE NEGATIVE Final   C Diff interpretation Results are indeterminate. See PCR results.  Final    Comment: Performed at [facility], [address], [place], [place] [address]  C. Diff by PCR, Reflexed     Status: None   Collection Time: 02/18/24  6:35 PM  Result Value Ref Range Status  Toxigenic C. Difficile by PCR NEGATIVE NEGATIVE Final    Comment: Patient is colonized with non toxigenic C. difficile. May not need treatment unless significant symptoms are present.   Hypervirulent Strain PRESUMPTIVE NEGATIVE PRESUMPTIVE NEGATIVE Final    Comment: Performed at [facility], [address] [place], [place] [address]  Gastrointestinal Panel by PCR , Stool     Status: None   Collection Time: 02/18/24  8:02 PM  Result Value Ref Range Status   Campylobacter species NOT DETECTED NOT DETECTED Final   Plesimonas shigelloides NOT DETECTED NOT DETECTED Final   Salmonella species NOT DETECTED NOT DETECTED Final   Yersinia enterocolitica NOT DETECTED NOT DETECTED Final   Vibrio species NOT DETECTED NOT DETECTED Final   Vibrio cholerae NOT DETECTED NOT DETECTED Final   Enteroaggregative E coli (EAEC) NOT DETECTED NOT DETECTED Final   Enteropathogenic E coli (EPEC) NOT DETECTED NOT DETECTED Final   Enterotoxigenic E coli (ETEC) NOT DETECTED NOT DETECTED Final   Shiga like toxin producing E coli (STEC) NOT DETECTED NOT DETECTED  Final   Shigella/Enteroinvasive E coli (EIEC) NOT DETECTED NOT DETECTED Final   Cryptosporidium NOT DETECTED NOT DETECTED Final   Cyclospora cayetanensis NOT DETECTED NOT DETECTED Final   Entamoeba histolytica NOT DETECTED NOT DETECTED Final   Giardia lamblia NOT DETECTED NOT DETECTED Final   Adenovirus F40/41 NOT DETECTED NOT DETECTED Final   Astrovirus NOT DETECTED NOT DETECTED Final   Norovirus GI/GII NOT DETECTED NOT DETECTED Final   Rotavirus A NOT DETECTED NOT DETECTED Final   Sapovirus (I, II, IV, and V) NOT DETECTED NOT DETECTED Final    Comment: Performed at [facility], [address] [place], [place] [address]  Respiratory (~20 pathogens) panel by PCR     Status: None   Collection Time: 02/19/24 12:58 AM   Specimen: Nasopharyngeal Swab; Respiratory  Result Value Ref Range Status   Adenovirus NOT DETECTED NOT DETECTED Final   Coronavirus 229E NOT DETECTED NOT DETECTED Final    Comment: (NOTE) The Coronavirus on the Respiratory Panel, DOES NOT test for the novel  Coronavirus (2019 nCoV)    Coronavirus HKU1 NOT DETECTED NOT DETECTED Final   Coronavirus NL63 NOT DETECTED NOT DETECTED Final   Coronavirus OC43 NOT DETECTED NOT DETECTED Final   Metapneumovirus NOT DETECTED NOT DETECTED Final   Rhinovirus / Enterovirus NOT DETECTED NOT DETECTED Final   Influenza A NOT DETECTED NOT DETECTED Final   Influenza B NOT DETECTED NOT DETECTED Final   Parainfluenza Virus 1 NOT DETECTED NOT DETECTED Final   Parainfluenza Virus 2 NOT DETECTED NOT DETECTED Final   Parainfluenza Virus 3 NOT DETECTED NOT DETECTED Final   Parainfluenza Virus 4 NOT DETECTED NOT DETECTED Final   Respiratory Syncytial Virus NOT DETECTED NOT DETECTED Final   Bordetella pertussis NOT DETECTED NOT DETECTED Final   Bordetella Parapertussis NOT DETECTED NOT DETECTED Final   Chlamydophila pneumoniae NOT DETECTED NOT DETECTED Final   Mycoplasma pneumoniae NOT DETECTED NOT DETECTED Final    Comment:  Performed at [facility], [address] [place], [place] [address]  Culture, blood (Routine X 2) w Reflex to ID Panel     Status: None   Collection Time: 02/19/24  2:39 AM   Specimen: BLOOD RIGHT HAND  Result Value Ref Range Status   Specimen Description BLOOD RIGHT HAND  Final   Special Requests   Final    BOTTLES DRAWN AEROBIC AND ANAEROBIC Blood Culture adequate volume IMMUNOCOMPROMISED  Culture   Final    NO GROWTH 5 DAYS Performed at [facility], [address], [place], [place] [address]    Report Status 02/24/2024 FINAL  Final  Culture, blood (Routine X 2) w Reflex to ID Panel     Status: None   Collection Time: 02/19/24  2:39 AM   Specimen: BLOOD RIGHT ARM  Result Value Ref Range Status   Specimen Description BLOOD RIGHT ARM  Final   Special Requests   Final    Immunocompromised BOTTLES DRAWN AEROBIC AND ANAEROBIC Blood Culture adequate volume   Culture   Final    NO GROWTH 5 DAYS Performed at [facility] [facility], [address], [place], [place] [address]    Report Status 02/24/2024 FINAL  Final  Urine Culture     Status: None   Collection Time: 02/19/24  6:32 AM   Specimen: Urine, Random  Result Value Ref Range Status   Specimen Description URINE, RANDOM  Final   Special Requests URINE, CLEAN CATCH  Final   Culture   Final    NO GROWTH Performed at [facility], [address], [place], [place] [address]    Report Status 02/20/2024 FINAL  Final  Surgical pcr screen     Status: Abnormal   Collection Time: 02/20/24  2:18 PM   Specimen: Nasal Mucosa; Nasal Swab  Result Value Ref Range Status   MRSA, PCR POSITIVE (A) NEGATIVE Final    Comment: RESULT CALLED TO, READ BACK BY AND VERIFIED WITH: RN CANDIE SHUCK [phone] @ [phone] FH    Staphylococcus aureus POSITIVE (A) NEGATIVE Final    Comment: (NOTE) The Xpert SA Assay (FDA approved for NASAL specimens in patients 29 years of age and older), is one component of a comprehensive surveillance program.  It is not intended to diagnose infection nor to guide or monitor treatment. Performed at [facility], [address] [place], [place] [address]     Radiology Studies: No results found.  Scheduled Meds:  acidophilus  1 capsule Oral Daily   ascorbic acid   1,000 mg Oral Daily   benzonatate   100 mg Oral TID   docusate sodium   100 mg Oral Daily   ferrous sulfate   325 mg Oral TID WC   heparin   5,000 Units Subcutaneous Q8H   insulin  aspart  0-15 Units Subcutaneous TID WC   insulin  aspart  0-5 Units Subcutaneous QHS   insulin  aspart  3 Units Subcutaneous TID WC   insulin  glargine-yfgn  10 Units Subcutaneous QHS   [START ON 02/26/2024] linezolid   600 mg Oral Q12H   metoprolol  succinate  50 mg Oral Daily   nutrition supplement (JUVEN)  1 packet Oral BID BM   sodium chloride  flush  3 mL Intravenous Q12H   Vitamin D  (Ergocalciferol )  50,000 Units Oral QHS   zinc  sulfate (50mg  elemental zinc )  220 mg Oral Daily   Continuous Infusions:   LOS: 8 days   Alejandro Marker, DO [facility] Available via Epic secure chat 7am-7pm After these hours, please refer to coverage provider listed on [email or web address] 02/25/2024, 8:32 PM  "

## 2024-02-26 ENCOUNTER — Inpatient Hospital Stay (HOSPITAL_COMMUNITY)

## 2024-02-26 DIAGNOSIS — F79 Unspecified intellectual disabilities: Secondary | ICD-10-CM | POA: Diagnosis not present

## 2024-02-26 DIAGNOSIS — R7881 Bacteremia: Secondary | ICD-10-CM | POA: Diagnosis not present

## 2024-02-26 DIAGNOSIS — Z89431 Acquired absence of right foot: Secondary | ICD-10-CM | POA: Diagnosis not present

## 2024-02-26 DIAGNOSIS — M009 Pyogenic arthritis, unspecified: Secondary | ICD-10-CM | POA: Diagnosis not present

## 2024-02-26 DIAGNOSIS — E11628 Type 2 diabetes mellitus with other skin complications: Secondary | ICD-10-CM | POA: Diagnosis not present

## 2024-02-26 DIAGNOSIS — B9562 Methicillin resistant Staphylococcus aureus infection as the cause of diseases classified elsewhere: Secondary | ICD-10-CM | POA: Diagnosis not present

## 2024-02-26 DIAGNOSIS — K529 Noninfective gastroenteritis and colitis, unspecified: Secondary | ICD-10-CM | POA: Diagnosis not present

## 2024-02-26 DIAGNOSIS — L02611 Cutaneous abscess of right foot: Secondary | ICD-10-CM | POA: Diagnosis not present

## 2024-02-26 DIAGNOSIS — L089 Local infection of the skin and subcutaneous tissue, unspecified: Secondary | ICD-10-CM | POA: Diagnosis not present

## 2024-02-26 DIAGNOSIS — L03115 Cellulitis of right lower limb: Secondary | ICD-10-CM | POA: Diagnosis not present

## 2024-02-26 DIAGNOSIS — S98111A Complete traumatic amputation of right great toe, initial encounter: Secondary | ICD-10-CM | POA: Diagnosis not present

## 2024-02-26 LAB — PHOSPHORUS: Phosphorus: 4.2 mg/dL (ref 2.5–4.6)

## 2024-02-26 LAB — CBC WITH DIFFERENTIAL/PLATELET
Basophils Absolute: 0 K/uL (ref 0.0–0.1)
Basophils Relative: 0 %
Eosinophils Absolute: 0 K/uL (ref 0.0–0.5)
Eosinophils Relative: 0 %
HCT: 25.6 % — ABNORMAL LOW (ref 39.0–52.0)
Hemoglobin: 8.6 g/dL — ABNORMAL LOW (ref 13.0–17.0)
Lymphocytes Relative: 8 %
Lymphs Abs: 0.3 K/uL — ABNORMAL LOW (ref 0.7–4.0)
MCH: 27.2 pg (ref 26.0–34.0)
MCHC: 33.6 g/dL (ref 30.0–36.0)
MCV: 81 fL (ref 80.0–100.0)
Monocytes Absolute: 0 K/uL — ABNORMAL LOW (ref 0.1–1.0)
Monocytes Relative: 1 %
Neutro Abs: 3.4 K/uL (ref 1.7–7.7)
Neutrophils Relative %: 91 %
Platelets: 244 K/uL (ref 150–400)
RBC: 3.16 MIL/uL — ABNORMAL LOW (ref 4.22–5.81)
RDW: 15.7 % — ABNORMAL HIGH (ref 11.5–15.5)
WBC: 3.7 K/uL — ABNORMAL LOW (ref 4.0–10.5)
nRBC: 0 % (ref 0.0–0.2)

## 2024-02-26 LAB — GLUCOSE, CAPILLARY
Glucose-Capillary: 351 mg/dL — ABNORMAL HIGH (ref 70–99)
Glucose-Capillary: 242 mg/dL — ABNORMAL HIGH (ref 70–99)
Glucose-Capillary: 222 mg/dL — ABNORMAL HIGH (ref 70–99)
Glucose-Capillary: 225 mg/dL — ABNORMAL HIGH (ref 70–99)

## 2024-02-26 LAB — COMPREHENSIVE METABOLIC PANEL WITH GFR
ALT: 7 U/L (ref 0–44)
AST: 19 U/L (ref 15–41)
Albumin: 2.6 g/dL — ABNORMAL LOW (ref 3.5–5.0)
Alkaline Phosphatase: 51 U/L (ref 38–126)
Anion gap: 11 (ref 5–15)
BUN: 66 mg/dL — ABNORMAL HIGH (ref 8–23)
CO2: 22 mmol/L (ref 22–32)
Calcium: 8 mg/dL — ABNORMAL LOW (ref 8.9–10.3)
Chloride: 98 mmol/L (ref 98–111)
Creatinine, Ser: 3.44 mg/dL — ABNORMAL HIGH (ref 0.61–1.24)
GFR, Estimated: 18 mL/min — ABNORMAL LOW
Glucose, Bld: 320 mg/dL — ABNORMAL HIGH (ref 70–99)
Potassium: 5 mmol/L (ref 3.5–5.1)
Sodium: 131 mmol/L — ABNORMAL LOW (ref 135–145)
Total Bilirubin: 0.2 mg/dL (ref 0.0–1.2)
Total Protein: 6 g/dL — ABNORMAL LOW (ref 6.5–8.1)

## 2024-02-26 LAB — MAGNESIUM: Magnesium: 2 mg/dL (ref 1.7–2.4)

## 2024-02-26 MED ORDER — METHYLPREDNISOLONE SODIUM SUCC 125 MG IJ SOLR
125.0000 mg | Freq: Once | INTRAMUSCULAR | Status: AC
  Administered 2024-02-26: 125 mg via INTRAVENOUS
  Filled 2024-02-26: qty 2

## 2024-02-26 NOTE — Progress Notes (Signed)
 " PROGRESS NOTE    [name]  FMW:[id number] DOB: 1951/01/19 DOA: 02/17/2024 PCP: Marvine Rush, MD   Brief Narrative:  The patient is a 74 year old with history of CVA with residual slurred speech, CKD 3 AA, chronic osteomyelitis s/p right TMA, HTN, DM 2 admitted to the hospital for generally feeling sick and having nausea/vomiting.  Workup showed concerns of sepsis with worsening right diabetic foot infection with abscess.  Dr. Harden was consulted and patient was transferred from [facility] to [facility] for surgical intervention.  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  Hospital course complicated by gram-positive bacteremia, echocardiogram unremarkable. Underwent TEE and showed no Vegetation. ID recommending transitioning to po Abx on 1/12 for 3 more weeks.   Assessment and Plan:  Sepsis secondary to right diabetic foot infection associated with abscess and osteomyelitis Gram-positive bacteremia -Orthopedic consulted.  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  Postop recommendations by their service. -ID team was following and now signed off -Repeat blood cultures 1/5 - NGTD -Having intermittent Temperatures and had another Temperature this AM; WBC is not elevated.  -Echocardiogram showing preserved EF 65%, grade 1 DD.  No evidence of vegetation -TEE done and showed no Vegetation -ID recommended continuing IV Daptomycin  and then transition to linezolid  on 02/26/24 to finish 3 more weeks with end date being 03/18/2024. Now on Linezolid  -CXR done and showed Patchy left lower lobe opacity, possibly representing atelectasis and/or infection. Will Repeat in the AM -PT/OT recommending SNF and likely will be Monday as we will transition him from IV antibiotics to oral antibiotics at that time. PT/OT needed to work with the patient today so [employer] can be started.   Normocytic Anemia: Combination of delusional and likely postsurgical blood loss but now stable.  Closely monitor.   Transfuse hemoglobin less than 7. Hgb/Hct Trend:  Recent Labs  Lab 02/20/24 0009 02/22/24 0216 02/23/24 0008 02/23/24 0535 02/24/24 0456 02/25/24 0541 02/26/24 0526  HGB 8.7* 7.6* 7.5* 7.3* 8.9* 8.4* 8.6*  HCT 26.0* 22.8* 23.1* 22.1* 26.7* 25.2* 25.6*  MCV 80.5 80.3 83.1 82.2 81.7 81.6 81.0  -Checked Anemia Panel and showed Iron level 14, UIBC 189, TIBC 203, saturation ration 7%, ferritin level 307, folate level 7.3. CTM for S/Sx of Bleeding; No overt bleeding noted. Repeat CBC in the AM    Type 2 Diabetes Mellitus with neurological complications, uncontrolled with Hyperglycemia -A1c 9.8.  Sliding scale and Accu-Chek.  Will adjust as necessary. CBG Trend:  Recent Labs  Lab 02/25/24 1544 02/25/24 1943 02/25/24 2025 02/26/24 0804 02/26/24 1203 02/26/24 1708 02/26/24 2137  GLUCAP 234* 318* 379* 351* 242* 222* 225*  -Will need to adjust given that Blood Sugars to be elevated in the setting of Solumedrol   AKI on CKD stage 3b / Metabolic Acidosis: Baseline creatinine around 2.0, admission creatinine 4.82 with IV fluids renal function is slowly improving, creatinine 3.42. BUN/Cr Trend: Recent Labs  Lab 02/20/24 0009 02/21/24 0515 02/22/24 0216 02/23/24 0535 02/24/24 0456 02/25/24 0541 02/26/24 0526  BUN 54* 49* 45* 39* 44* 56* 66*  CREATININE 3.80* 3.49* 3.42* 3.46* 3.29* 3.56* 3.44*  -Now improved and CO2 is 22, AG is 9, Chloride Level is 104  -Renal ultrasound does not show acute pathology; Will repeat IV Lasix  40 mg x1 -Avoid Nephrotoxic Medications, Contrast Dyes, Hypotension and Dehydration to Ensure Adequate Renal Perfusion and will need to Renally Adjust Meds. CTM & Trend Renal Function carefully and repeat CMP in the AM    Gastroenteritis:  Improved. C. difficile antigen positive per toxigenic PCR is negative.  GI panel is neg. CTM for S/Sx     Diffuse Macular Rash: Unclear etiology.  Patient thinks it is getting worse.  Will try Solu-Medrol  1.5 mg IV once again.  Add  Benadryl .  Will need outpatient follow-up with dermatology   Essential Hypertension  Sinus Tachycardia -Toprol -XL IV as needed. CTM BP per Protocol. Last BP reading was 129/64  Hypomagnesemia/Hypokalemia: K+ and Mag Level Trend:  Recent Labs  Lab 02/20/24 0009 02/21/24 0515 02/22/24 0216 02/23/24 0535 02/24/24 0456 02/25/24 0541 02/26/24 0526  K 3.6 4.1 4.0 3.8 3.9 4.4 5.0  MG 1.5* 1.7 1.5* 1.5* 1.9 1.9 2.0  -Replete w/ IV Mag Sulfate 2 grams. CTM and Replete as Necessary. Repeat CMP and Mag in the AM   Hypoalbuminemia: Patient's Albumin Lvl 3.6 -> 2.3 x2 -> 2.6. CTM & Trend a& Repeat CMP in the AM    DVT prophylaxis: heparin  injection 5,000 Units Start: 02/22/24 0600 SCD's Start: 02/21/24 1629    Code Status: Full Code Family Communication: No family present @ bedside  Disposition Plan:  Level of care: Telemetry Status is: Inpatient Remains inpatient appropriate because: Needs SNF and awaiting Insurance Auth   Consultants:  ID Orthopedic Surgery  Procedures:  As delineated as above  Antimicrobials:  Anti-infectives (From admission, onward)    Start     Dose/Rate Route Frequency Ordered Stop   02/26/24 1000  linezolid  (ZYVOX ) tablet 600 mg        600 mg Oral Every 12 hours 02/23/24 1650 03/19/24 0959   02/21/24 1650  DAPTOmycin  (CUBICIN ) IVPB 700 mg/134mL premix        8 mg/kg  90.2 kg 200 mL/hr over 30 Minutes Intravenous Every 48 hours 02/21/24 1650 02/25/24 1708   02/21/24 1153  ceFAZolin  (ANCEF ) 2-4 GM/100ML-% IVPB       Note to Pharmacy: Jonda Bottoms M: cabinet override      02/21/24 1153 02/21/24 2359   02/20/24 0930  ceFAZolin  (ANCEF ) IVPB 2g/100 mL premix  Status:  Discontinued        2 g 200 mL/hr over 30 Minutes Intravenous On call to O.R. 02/20/24 0927 02/21/24 0559   02/19/24 1215  DAPTOmycin  (CUBICIN ) IVPB 700 mg/115mL premix  Status:  Discontinued        8 mg/kg  90.2 kg 200 mL/hr over 30 Minutes Intravenous Every 48 hours 02/19/24 1201  02/21/24 1650   02/18/24 1000  linezolid  (ZYVOX ) IVPB 600 mg  Status:  Discontinued        600 mg 300 mL/hr over 60 Minutes Intravenous Every 12 hours 02/17/24 1912 02/19/24 1159   02/18/24 0800  Ampicillin -Sulbactam (UNASYN ) 3 g in sodium chloride  0.9 % 100 mL IVPB  Status:  Discontinued        3 g 200 mL/hr over 30 Minutes Intravenous Every 12 hours 02/17/24 1912 02/22/24 1014   02/17/24 1715  cefTRIAXone  (ROCEPHIN ) 2 g in sodium chloride  0.9 % 100 mL IVPB  Status:  Discontinued        2 g 200 mL/hr over 30 Minutes Intravenous Every 24 hours 02/17/24 1709 02/17/24 1916   02/17/24 1430  vancomycin  (VANCOCIN ) IVPB 1000 mg/200 mL premix  Status:  Discontinued        1,000 mg 200 mL/hr over 60 Minutes Intravenous  Once 02/17/24 1421 02/17/24 1424   02/17/24 1430  vancomycin  (VANCOREADY) IVPB 2000 mg/400 mL        2,000 mg 200 mL/hr  over 120 Minutes Intravenous  Once 02/17/24 1424 02/17/24 1700       Subjective: Seen and examined at bedside and he has been resting.  Denied any chest pain or shortness breath.  Thinks his rash is a little worse but has not been itching as much.  Denied any chest pain.  No other concerns or complaints this time.  Objective: Vitals:   02/26/24 0433 02/26/24 0805 02/26/24 1704 02/26/24 2121  BP: (!) 168/63 (!) 165/70 133/66 129/64  Pulse: 74  80 81  Resp: 20   17  Temp: 97.9 F (36.6 C) 98.6 F (37 C) 98 F (36.7 C) 98.7 F (37.1 C)  TempSrc: Oral Oral Oral Oral  SpO2: 93% 95% 94%   Weight:      Height:        Intake/Output Summary (Last 24 hours) at 02/26/2024 2146 Last data filed at 02/26/2024 9377 Gross per 24 hour  Intake --  Output 1850 ml  Net -1850 ml   Filed Weights   02/17/24 1420 02/21/24 1215  Weight: 90.2 kg 90.2 kg   Examination: Physical Exam:  Constitutional: WN/WD overweight chronically ill-appearing elderly Caucasian male who appears calm Respiratory: Diminished to auscultation bilaterally, no wheezing, rales, rhonchi or  crackles. Normal respiratory effort and patient is not tachypenic. No accessory muscle use.  Unlabored breathing Cardiovascular: RRR, no murmurs / rubs / gallops. S1 and S2 auscultated.  1+ extremity edema Abdomen: Soft, non-tender, mildly distended bowel sounds positive.  GU: Deferred. Musculoskeletal: His right BKA Skin: Rash noted on the arms and legs but not on the trunk Neurologic: CN 2-12 grossly intact with no focal deficits. Has some dysarthria. Romberg sign and cerebellar reflexes not assessed.  Psychiatric: Appears calm  Data Reviewed: I have personally reviewed following labs and imaging studies  CBC: Recent Labs  Lab 02/23/24 0008 02/23/24 0535 02/24/24 0456 02/25/24 0541 02/26/24 0526  WBC 3.0* 2.9* 3.5* 5.6 3.7*  NEUTROABS  --   --  2.0 4.1 3.4  HGB 7.5* 7.3* 8.9* 8.4* 8.6*  HCT 23.1* 22.1* 26.7* 25.2* 25.6*  MCV 83.1 82.2 81.7 81.6 81.0  PLT 196 202 237 245 244   Basic Metabolic Panel: Recent Labs  Lab 02/20/24 0009 02/21/24 0515 02/22/24 0216 02/23/24 0535 02/24/24 0456 02/25/24 0541 02/26/24 0526  NA 136   < > 137 137 133* 135 131*  K 3.6   < > 4.0 3.8 3.9 4.4 5.0  CL 104   < > 108 108 102 104 98  CO2 22   < > 20* 21* 22 22 22   GLUCOSE 147*   < > 132* 179* 131* 109* 320*  BUN 54*   < > 45* 39* 44* 56* 66*  CREATININE 3.80*   < > 3.42* 3.46* 3.29* 3.56* 3.44*  CALCIUM  8.1*   < > 7.3* 7.1* 7.4* 7.4* 8.0*  MG 1.5*   < > 1.5* 1.5* 1.9 1.9 2.0  PHOS 3.7  --   --   --  4.0 3.6 4.2   < > = values in this interval not displayed.   GFR: Estimated Creatinine Clearance: 22.6 mL/min (A) (by C-G formula based on SCr of 3.44 mg/dL (H)). Liver Function Tests: Recent Labs  Lab 02/24/24 0456 02/25/24 0541 02/26/24 0526  AST 23 21 19   ALT 6 8 7   ALKPHOS 43 43 51  BILITOT <0.2 0.2 0.2  PROT 5.3* 5.2* 6.0*  ALBUMIN 2.3* 2.3* 2.6*   No results for input(s): LIPASE, AMYLASE in the  last 168 hours. No results for input(s): AMMONIA in the last 168  hours. Coagulation Profile: No results for input(s): INR, PROTIME in the last 168 hours. Cardiac Enzymes: Recent Labs  Lab 02/20/24 0009  CKTOTAL 17*   BNP (last 3 results) No results for input(s): PROBNP in the last 8760 hours. HbA1C: No results for input(s): HGBA1C in the last 72 hours. CBG: Recent Labs  Lab 02/25/24 2025 02/26/24 0804 02/26/24 1203 02/26/24 1708 02/26/24 2137  GLUCAP 379* 351* 242* 222* 225*   Lipid Profile: No results for input(s): CHOL, HDL, LDLCALC, TRIG, CHOLHDL, LDLDIRECT in the last 72 hours. Thyroid  Function Tests: No results for input(s): TSH, T4TOTAL, FREET4, T3FREE, THYROIDAB in the last 72 hours. Anemia Panel: Recent Labs    02/23/24 2225 02/24/24 0456  VITAMINB12 536  --   FOLATE  --  7.3  FERRITIN  --  307  TIBC  --  203*  IRON  --  14*  RETICCTPCT  --  2.6   Sepsis Labs: No results for input(s): PROCALCITON, LATICACIDVEN in the last 168 hours.  Recent Results (from the past 240 hours)  Resp panel by RT-PCR (RSV, Flu A&B, Covid) Anterior Nasal Swab     Status: None   Collection Time: 02/17/24  2:21 PM   Specimen: Anterior Nasal Swab  Result Value Ref Range Status   SARS Coronavirus 2 by RT PCR NEGATIVE NEGATIVE Final    Comment: (NOTE) SARS-CoV-2 target nucleic acids are NOT DETECTED.  The SARS-CoV-2 RNA is generally detectable in upper respiratory specimens during the acute phase of infection. The lowest concentration of SARS-CoV-2 viral copies this assay can detect is 138 copies/mL. A negative result does not preclude SARS-Cov-2 infection and should not be used as the sole basis for treatment or other patient management decisions. A negative result may occur with  improper specimen collection/handling, submission of specimen other than nasopharyngeal swab, presence of viral mutation(s) within the areas targeted by this assay, and inadequate number of viral copies(<138 copies/mL). A  negative result must be combined with clinical observations, patient history, and epidemiological information. The expected result is Negative.  Fact Sheet for Patients:  bloggercourse.com  Fact Sheet for Healthcare Providers:  seriousbroker.it  This test is no t yet approved or cleared by the [place]  FDA and  has been authorized for detection and/or diagnosis of SARS-CoV-2 by FDA under an Emergency Use Authorization (EUA). This EUA will remain  in effect (meaning this test can be used) for the duration of the COVID-19 declaration under Section 564(b)(1) of the Act, 21 U.S.C.section 360bbb-3(b)(1), unless the authorization is terminated  or revoked sooner.       Influenza A by PCR NEGATIVE NEGATIVE Final   Influenza B by PCR NEGATIVE NEGATIVE Final    Comment: (NOTE) The Xpert Xpress SARS-CoV-2/FLU/RSV plus assay is intended as an aid in the diagnosis of influenza from Nasopharyngeal swab specimens and should not be used as a sole basis for treatment. Nasal washings and aspirates are unacceptable for Xpert Xpress SARS-CoV-2/FLU/RSV testing.  Fact Sheet for Patients: bloggercourse.com  Fact Sheet for Healthcare Providers: seriousbroker.it  This test is not yet approved or cleared by the [place]  FDA and has been authorized for detection and/or diagnosis of SARS-CoV-2 by FDA under an Emergency Use Authorization (EUA). This EUA will remain in effect (meaning this test can be used) for the duration of the COVID-19 declaration under Section 564(b)(1) of the Act, 21 U.S.C. section 360bbb-3(b)(1), unless the authorization is terminated or  revoked.     Resp Syncytial Virus by PCR NEGATIVE NEGATIVE Final    Comment: (NOTE) Fact Sheet for Patients: bloggercourse.com  Fact Sheet for Healthcare  Providers: seriousbroker.it  This test is not yet approved or cleared by the [place]  FDA and has been authorized for detection and/or diagnosis of SARS-CoV-2 by FDA under an Emergency Use Authorization (EUA). This EUA will remain in effect (meaning this test can be used) for the duration of the COVID-19 declaration under Section 564(b)(1) of the Act, 21 U.S.C. section 360bbb-3(b)(1), unless the authorization is terminated or revoked.  Performed at [facility], [address], [place], [place] [address]   Blood Culture (routine x 2)     Status: Abnormal   Collection Time: 02/17/24  2:48 PM   Specimen: BLOOD LEFT ARM  Result Value Ref Range Status   Specimen Description   Final    BLOOD LEFT ARM BOTTLES DRAWN AEROBIC AND ANAEROBIC Performed at [facility], [address] [place], [place] [address]    Special Requests   Final    Blood Culture adequate volume Performed at [facility], [address], [place], [place] [address]    Culture  Setup Time   Final    GRAM POSITIVE COCCI ANAEROBIC BOTTLE ONLY Gram Stain Report Called to,Read Back By and Verified With: C. ANTONE ON 02/18/2024 @10 :13AM BY T. HAMER  CRITICAL RESULT CALLED TO, READ BACK BY AND VERIFIED WITH: RN DOROTHYANN ANTONE [id number] AT 1351 BY EC Performed at [facility], [address], [place], [place] [address]    Culture METHICILLIN RESISTANT STAPHYLOCOCCUS AUREUS (A)  Final   Report Status 02/20/2024 FINAL  Final   Organism ID, Bacteria METHICILLIN RESISTANT STAPHYLOCOCCUS AUREUS  Final      Susceptibility   Methicillin resistant staphylococcus aureus - MIC*    CIPROFLOXACIN  >=8 RESISTANT Resistant     ERYTHROMYCIN <=0.25 SENSITIVE Sensitive     GENTAMICIN <=0.5 SENSITIVE Sensitive     OXACILLIN >=4 RESISTANT Resistant     TETRACYCLINE <=1 SENSITIVE Sensitive     VANCOMYCIN  1 SENSITIVE Sensitive     TRIMETH /SULFA  <=10 SENSITIVE Sensitive     CLINDAMYCIN <=0.25 SENSITIVE  Sensitive     RIFAMPIN <=0.5 SENSITIVE Sensitive     Inducible Clindamycin NEGATIVE Sensitive     LINEZOLID  2 SENSITIVE Sensitive     * METHICILLIN RESISTANT STAPHYLOCOCCUS AUREUS  Blood Culture ID Panel (Reflexed)     Status: Abnormal   Collection Time: 02/17/24  2:48 PM  Result Value Ref Range Status   Enterococcus faecalis NOT DETECTED NOT DETECTED Final   Enterococcus Faecium NOT DETECTED NOT DETECTED Final   Listeria monocytogenes NOT DETECTED NOT DETECTED Final   Staphylococcus species DETECTED (A) NOT DETECTED Final    Comment: CRITICAL RESULT CALLED TO, READ BACK BY AND VERIFIED WITH: RN DOROTHYANN ANTONE [id number] AT 1351 BY EC    Staphylococcus aureus (BCID) DETECTED (A) NOT DETECTED Final    Comment: Methicillin (oxacillin)-resistant Staphylococcus aureus (MRSA). MRSA is predictably resistant to beta-lactam antibiotics (except ceftaroline). Preferred therapy is vancomycin  unless clinically contraindicated. Patient requires contact precautions if  hospitalized. CRITICAL RESULT CALLED TO, READ BACK BY AND VERIFIED WITH: RN DOROTHYANN ANTONE [id number] AT 1351 BY EC    Staphylococcus epidermidis NOT DETECTED NOT DETECTED Final   Staphylococcus lugdunensis NOT DETECTED NOT DETECTED Final   Streptococcus species NOT DETECTED NOT DETECTED Final   Streptococcus agalactiae NOT DETECTED NOT DETECTED Final   Streptococcus pneumoniae NOT DETECTED NOT DETECTED Final  Streptococcus pyogenes NOT DETECTED NOT DETECTED Final   A.calcoaceticus-baumannii NOT DETECTED NOT DETECTED Final   Bacteroides fragilis NOT DETECTED NOT DETECTED Final   Enterobacterales NOT DETECTED NOT DETECTED Final   Enterobacter cloacae complex NOT DETECTED NOT DETECTED Final   Escherichia coli NOT DETECTED NOT DETECTED Final   Klebsiella aerogenes NOT DETECTED NOT DETECTED Final   Klebsiella oxytoca NOT DETECTED NOT DETECTED Final   Klebsiella pneumoniae NOT DETECTED NOT DETECTED Final   Proteus species NOT  DETECTED NOT DETECTED Final   Salmonella species NOT DETECTED NOT DETECTED Final   Serratia marcescens NOT DETECTED NOT DETECTED Final   Haemophilus influenzae NOT DETECTED NOT DETECTED Final   Neisseria meningitidis NOT DETECTED NOT DETECTED Final   Pseudomonas aeruginosa NOT DETECTED NOT DETECTED Final   Stenotrophomonas maltophilia NOT DETECTED NOT DETECTED Final   Candida albicans NOT DETECTED NOT DETECTED Final   Candida auris NOT DETECTED NOT DETECTED Final   Candida glabrata NOT DETECTED NOT DETECTED Final   Candida krusei NOT DETECTED NOT DETECTED Final   Candida parapsilosis NOT DETECTED NOT DETECTED Final   Candida tropicalis NOT DETECTED NOT DETECTED Final   Cryptococcus neoformans/gattii NOT DETECTED NOT DETECTED Final   Meth resistant mecA/C and MREJ DETECTED (A) NOT DETECTED Final    Comment: CRITICAL RESULT CALLED TO, READ BACK BY AND VERIFIED WITH: RN DOROTHYANN IS[place] [id number] AT 1351 BY EC Performed at [facility], [address] [place], [place] [address]   MIC (1 Drug)-     Status: Abnormal   Collection Time: 02/17/24  2:48 PM  Result Value Ref Range Status   Min Inhibitory Conc (1 Drug) Final report (A)  Corrected    Comment: (NOTE) Performed At: [facility] [address] [place], [place] [id number] Jennette Shorter MD Ey:[id number] CORRECTED ON 01/10 AT 1335: PREVIOUSLY REPORTED AS Preliminary report    Source DAPTOMYCIN  MIC MRSA BLOOD CULTURE  Final    Comment: Performed at [facility], [address] [place], [place] [address]  MIC Result     Status: Abnormal   Collection Time: 02/17/24  2:48 PM  Result Value Ref Range Status   Result 1 (MIC) Comment (A)  Final    Comment: (NOTE) Methicillin - resistant Staphylococcus aureus Identification performed by account, not confirmed by this laboratory. Testing performed by broth microdilution. DAPTOMYCIN   <=0.25 ug/ML  SUSCEPTIBLE Performed At: [facility] [address]  [place], [place] [id number] Jennette Shorter MD Ey:[id number]   Blood Culture (routine x 2)     Status: None   Collection Time: 02/17/24  2:50 PM   Specimen: BLOOD LEFT WRIST  Result Value Ref Range Status   Specimen Description   Final    BLOOD LEFT WRIST BOTTLES DRAWN AEROBIC AND ANAEROBIC   Special Requests Blood Culture adequate volume  Final   Culture   Final    NO GROWTH 5 DAYS Performed at [facility], [address], [place], [place] [address]    Report Status 02/22/2024 FINAL  Final  C Difficile Quick Screen w PCR reflex     Status: Abnormal   Collection Time: 02/18/24  6:35 PM   Specimen: Stool  Result Value Ref Range Status   C Diff antigen POSITIVE (A) NEGATIVE Final   C Diff toxin NEGATIVE NEGATIVE Final   C Diff interpretation Results are indeterminate. See PCR results.  Final    Comment: Performed at [facility], [address] [place], [place] [address]  C. Diff by  PCR, Reflexed     Status: None   Collection Time: 02/18/24  6:35 PM  Result Value Ref Range Status   Toxigenic C. Difficile by PCR NEGATIVE NEGATIVE Final    Comment: Patient is colonized with non toxigenic C. difficile. May not need treatment unless significant symptoms are present.   Hypervirulent Strain PRESUMPTIVE NEGATIVE PRESUMPTIVE NEGATIVE Final    Comment: Performed at [facility], [address], [place], [place] [address]  Gastrointestinal Panel by PCR , Stool     Status: None   Collection Time: 02/18/24  8:02 PM  Result Value Ref Range Status   Campylobacter species NOT DETECTED NOT DETECTED Final   Plesimonas shigelloides NOT DETECTED NOT DETECTED Final   Salmonella species NOT DETECTED NOT DETECTED Final   Yersinia enterocolitica NOT DETECTED NOT DETECTED Final   Vibrio species NOT DETECTED NOT DETECTED Final   Vibrio cholerae NOT DETECTED NOT DETECTED Final   Enteroaggregative E coli (EAEC) NOT DETECTED NOT DETECTED Final   Enteropathogenic E coli (EPEC) NOT DETECTED NOT DETECTED  Final   Enterotoxigenic E coli (ETEC) NOT DETECTED NOT DETECTED Final   Shiga like toxin producing E coli (STEC) NOT DETECTED NOT DETECTED Final   Shigella/Enteroinvasive E coli (EIEC) NOT DETECTED NOT DETECTED Final   Cryptosporidium NOT DETECTED NOT DETECTED Final   Cyclospora cayetanensis NOT DETECTED NOT DETECTED Final   Entamoeba histolytica NOT DETECTED NOT DETECTED Final   Giardia lamblia NOT DETECTED NOT DETECTED Final   Adenovirus F40/41 NOT DETECTED NOT DETECTED Final   Astrovirus NOT DETECTED NOT DETECTED Final   Norovirus GI/GII NOT DETECTED NOT DETECTED Final   Rotavirus A NOT DETECTED NOT DETECTED Final   Sapovirus (I, II, IV, and V) NOT DETECTED NOT DETECTED Final    Comment: Performed at [facility], [address] [place], [place] [address]  Respiratory (~20 pathogens) panel by PCR     Status: None   Collection Time: 02/19/24 12:58 AM   Specimen: Nasopharyngeal Swab; Respiratory  Result Value Ref Range Status   Adenovirus NOT DETECTED NOT DETECTED Final   Coronavirus 229E NOT DETECTED NOT DETECTED Final    Comment: (NOTE) The Coronavirus on the Respiratory Panel, DOES NOT test for the novel  Coronavirus (2019 nCoV)    Coronavirus HKU1 NOT DETECTED NOT DETECTED Final   Coronavirus NL63 NOT DETECTED NOT DETECTED Final   Coronavirus OC43 NOT DETECTED NOT DETECTED Final   Metapneumovirus NOT DETECTED NOT DETECTED Final   Rhinovirus / Enterovirus NOT DETECTED NOT DETECTED Final   Influenza A NOT DETECTED NOT DETECTED Final   Influenza B NOT DETECTED NOT DETECTED Final   Parainfluenza Virus 1 NOT DETECTED NOT DETECTED Final   Parainfluenza Virus 2 NOT DETECTED NOT DETECTED Final   Parainfluenza Virus 3 NOT DETECTED NOT DETECTED Final   Parainfluenza Virus 4 NOT DETECTED NOT DETECTED Final   Respiratory Syncytial Virus NOT DETECTED NOT DETECTED Final   Bordetella pertussis NOT DETECTED NOT DETECTED Final   Bordetella Parapertussis NOT DETECTED NOT DETECTED  Final   Chlamydophila pneumoniae NOT DETECTED NOT DETECTED Final   Mycoplasma pneumoniae NOT DETECTED NOT DETECTED Final    Comment: Performed at [facility], [address] [place], [place] [address]  Culture, blood (Routine X 2) w Reflex to ID Panel     Status: None   Collection Time: 02/19/24  2:39 AM   Specimen: BLOOD RIGHT HAND  Result Value Ref Range Status   Specimen Description BLOOD RIGHT HAND  Final   Special Requests   Final    BOTTLES DRAWN AEROBIC AND ANAEROBIC Blood Culture adequate volume IMMUNOCOMPROMISED   Culture   Final    NO GROWTH 5 DAYS Performed at [facility], [address] [place], [place] [address]    Report Status 02/24/2024 FINAL  Final  Culture, blood (Routine X 2) w Reflex to ID Panel     Status: None   Collection Time: 02/19/24  2:39 AM   Specimen: BLOOD RIGHT ARM  Result Value Ref Range Status   Specimen Description BLOOD RIGHT ARM  Final   Special Requests   Final    Immunocompromised BOTTLES DRAWN AEROBIC AND ANAEROBIC Blood Culture adequate volume   Culture   Final    NO GROWTH 5 DAYS Performed at [facility], [address], [place], [place] [address]    Report Status 02/24/2024 FINAL  Final  Urine Culture     Status: None   Collection Time: 02/19/24  6:32 AM   Specimen: Urine, Random  Result Value Ref Range Status   Specimen Description URINE, RANDOM  Final   Special Requests URINE, CLEAN CATCH  Final   Culture   Final    NO GROWTH Performed at [facility], [address], [place], [place] [address]    Report Status 02/20/2024 FINAL  Final  Surgical pcr screen     Status: Abnormal   Collection Time: 02/20/24  2:18 PM   Specimen: Nasal Mucosa; Nasal Swab  Result Value Ref Range Status   MRSA, PCR POSITIVE (A) NEGATIVE Final    Comment: RESULT CALLED TO, READ BACK BY AND VERIFIED WITH: RN CANDIE SHUCK [phone] @ [phone] FH    Staphylococcus aureus POSITIVE (A) NEGATIVE Final    Comment: (NOTE) The Xpert SA  Assay (FDA approved for NASAL specimens in patients 73 years of age and older), is one component of a comprehensive surveillance program. It is not intended to diagnose infection nor to guide or monitor treatment. Performed at [facility], [address] [place], [place] [address]     Radiology Studies: DG CHEST PORT 1 VIEW Result Date: 02/26/2024 CLINICAL DATA:  Shortness of breath EXAM: PORTABLE CHEST 1 VIEW COMPARISON:  Chest CT dated 02/23/2024. FINDINGS: There is mild cardiomegaly with mild vascular congestion. Faint bilateral streaky densities concerning for developing infiltrate. Trace bilateral pleural effusions may be present. No pneumothorax. No acute osseous pathology. IMPRESSION: 1. Mild cardiomegaly with mild vascular congestion. 2. Faint bilateral streaky densities concerning for developing infiltrate. Electronically Signed   By: Vanetta Chou M.D.   On: 02/26/2024 18:31   Scheduled Meds:  acidophilus  1 capsule Oral Daily   ascorbic acid   1,000 mg Oral Daily   benzonatate   100 mg Oral TID   docusate sodium   100 mg Oral Daily   ferrous sulfate   325 mg Oral TID WC   heparin   5,000 Units Subcutaneous Q8H   insulin  aspart  0-15 Units Subcutaneous TID WC   insulin  aspart  0-5 Units Subcutaneous QHS   insulin  aspart  3 Units Subcutaneous TID WC   insulin  glargine-yfgn  10 Units Subcutaneous QHS   linezolid   600 mg Oral Q12H   methylPREDNISolone  (SOLU-MEDROL ) injection  125 mg Intravenous Once   metoprolol  succinate  50 mg Oral Daily   nutrition supplement (JUVEN)  1 packet Oral BID BM   sodium chloride  flush  3 mL Intravenous Q12H   Vitamin D  (Ergocalciferol )  50,000  Units Oral QHS   zinc  sulfate (50mg  elemental zinc )  220 mg Oral Daily   Continuous Infusions:   LOS: 9 days   Alejandro Marker, DO [facility] Available via Epic secure chat 7am-7pm After these hours, please refer to coverage provider listed on [email or web address] 02/26/2024, 9:46 PM  "

## 2024-02-26 NOTE — TOC Progression Note (Addendum)
 Transition of Care [facility]) - Progression Note    Patient Details  Name: [name] MRN: [id number] Date of Birth: 09/26/1950  Transition of Care [facility]) CM/SW Contact  Inocente GORMAN Kindle, LCSW Phone Number: 02/26/2024, 8:35 AM  Clinical Narrative:    8:35am-CSW requested to see if therapy can work with patient today so insurance process can be started for [facility].   12:03 PM-CSW initiated insurance process, Ref# [id number], and updated [facility].   Expected Discharge Plan: Skilled Nursing Facility Barriers to Discharge: Continued Medical Work up, SNF Pending bed offer, English As A Second Language Teacher               Expected Discharge Plan and Services In-house Referral: Clinical Social Work Discharge Planning Services: CM Consult   Living arrangements for the past 2 months: Single Family Home                                       Social Drivers of Health (SDOH) Interventions SDOH Screenings   Food Insecurity: No Food Insecurity (02/17/2024)  Housing: Low Risk (02/17/2024)  Transportation Needs: No Transportation Needs (02/17/2024)  Utilities: Not At Risk (02/17/2024)  Financial Resource Strain: Low Risk (12/13/2021)  Social Connections: Socially Isolated (02/17/2024)  Tobacco Use: Low Risk (02/21/2024)    Readmission Risk Interventions    11/30/2021    2:51 PM  Readmission Risk Prevention Plan  Transportation Screening Complete  HRI or Home Care Consult Complete  Social Work Consult for Recovery Care Planning/Counseling Complete  Palliative Care Screening Not Applicable  Medication Review Oceanographer) Complete

## 2024-02-26 NOTE — Plan of Care (Signed)
" °  Problem: Education: Goal: Knowledge of General Education information will improve Description: Including pain rating scale, medication(s)/side effects and non-pharmacologic comfort measures Outcome: Progressing   Problem: Clinical Measurements: Goal: Will remain free from infection Outcome: Progressing   Problem: Nutrition: Goal: Adequate nutrition will be maintained Outcome: Progressing   Problem: Pain Managment: Goal: General experience of comfort will improve and/or be controlled Outcome: Progressing   Problem: Safety: Goal: Ability to remain free from injury will improve Outcome: Progressing   Problem: Education: Goal: Ability to describe self-care measures that may prevent or decrease complications (Diabetes Survival Skills Education) will improve Outcome: Progressing   Problem: Coping: Goal: Ability to adjust to condition or change in health will improve Outcome: Progressing   "

## 2024-02-26 NOTE — Discharge Instructions (Signed)
 To address social isolation:  Education Officer, [employer]: [phone] / [address] [place], [place] [address]  -Dial 988: Talk lifeline 24/7.  -[place]  - [facility] Warmline: [phone]  -Enroll in PACE program (Program of All-Inclusive Care for the Elderly): a Medicare and Medicaid initiative that provides comprehensive medical and social services to frail seniors (55+) who need nursing home-level care but prefer to stay in their communities, allowing them to age in place with support like primary care, therapy, meals, and transportation, coordinated by an interdisciplinary team. If you have both Medicare (for people 65+) and Medicaid (income-tested), then you may pay nothing. People without Medicare or Medicaid pay a monthly premium. The amount of this premium depends on your healthcare and financial needs. Long-term care insurance may pay for your PACE care. This coverage is determined by your insurer. Enrollment Information: (336) [phone] Office: [phone]

## 2024-02-26 NOTE — Inpatient Diabetes Management (Signed)
 Inpatient Diabetes Program Recommendations  AACE/ADA: New Consensus Statement on Inpatient Glycemic Control (2015)  Target Ranges:  Prepandial:   less than 140 mg/dL      Peak postprandial:   less than 180 mg/dL (1-2 hours)      Critically ill patients:  140 - 180 mg/dL   Lab Results  Component Value Date   GLUCAP 242 (H) 02/26/2024   HGBA1C 9.8 (H) 01/17/2024    Review of Glycemic Control  Latest Reference Range & Units 02/25/24 15:44 02/25/24 19:43 02/25/24 20:25 02/26/24 08:04 02/26/24 12:03  Glucose-Capillary 70 - 99 mg/dL 765 (H) 681 (H) 620 (H) 351 (H) 242 (H)   Diabetes history: DM 2 Outpatient Diabetes medications:  Amaryl 4 mg daily Toujeo  20 units daily Actos 30 mg daily Current orders for Inpatient glycemic control:  Novolog  0-15 units tid with meals and HS Novolog  3 units tid with meals (meal coverage- ) Semglee  10 units q HS  Inpatient Diabetes Program Recommendations:    Note CBG's increased after receiving Solumedrol 125 mg x1 on 02/25/24.  Anticipate that CBG's will improved.  Will follow.   Thanks,  Randall Bullocks, RN, BC-ADM Inpatient Diabetes Coordinator Pager [phone]  (8a-5p)

## 2024-02-26 NOTE — Plan of Care (Signed)
  Problem: Education: Goal: Knowledge of General Education information will improve Description: Including pain rating scale, medication(s)/side effects and non-pharmacologic comfort measures Outcome: Progressing   Problem: Health Behavior/Discharge Planning: Goal: Ability to manage health-related needs will improve Outcome: Progressing   Problem: Clinical Measurements: Goal: Diagnostic test results will improve Outcome: Progressing Goal: Cardiovascular complication will be avoided Outcome: Progressing   Problem: Activity: Goal: Risk for activity intolerance will decrease Outcome: Progressing   

## 2024-02-26 NOTE — Progress Notes (Signed)
 Physical Therapy Treatment  Patient Details Name: [name] MRN: [id number] DOB: 11-11-1950 Today's Date: 02/26/2024   History of Present Illness [name] is a 74 y.o. M adm 02/17/24 for concerns of sepsis with worsening right diabetic foot infection with abscess. MRI showed open wound along the medial aspect of the hindfoot with an underlying large complex abscess involving the subcutaneous tissues and also the underlying musculature as well as osteomyelitis involving the medial cuneiform and the calcaneus. 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  Readmit from SNF with R BKA 02/21/23.    PT Comments  Pt progressing towards physical therapy goals. Focus of session was hygiene after bowel incontinence and lateral scoot training. Up to mod assist provided throughout. Pt motivated to be as independent as possible with transfers but accepting of assist when needed. Will continue to follow.     If plan is discharge home, recommend the following: Two people to help with walking and/or transfers;A lot of help with bathing/dressing/bathroom;Assistance with cooking/housework;Assist for transportation;Help with stairs or ramp for entrance   Can travel by private vehicle        Equipment Recommendations  Wheelchair (measurements PT);Wheelchair cushion (measurements PT);Hoyer lift;BSC/3in1    Recommendations for Other Services       Precautions / Restrictions Precautions Precautions: Fall Recall of Precautions/Restrictions: Impaired Precaution/Restrictions Comments: Wound Vac RLE Required Braces or Orthoses: Other Brace Restrictions Weight Bearing Restrictions Per Provider Order: Yes RLE Weight Bearing Per Provider Order: Non weight bearing     Mobility  Bed Mobility Overal bed mobility: Needs Assistance Bed Mobility: Rolling, Sidelying to Sit,  Sit to Supine Rolling: Min assist, Used rails Sidelying to sit: Mod assist, HOB elevated   Sit to supine: Contact guard assist   General bed mobility comments: Pt with bowel incontinence in bed upon arrival. Pt rolled L for PT to perform hygiene. From sidelying pt sat up on EOB to the L side of bed. Elbow propping to R and L and back to center in sitting for core stability training and to get dirty linen out from under pt.    Transfers Overall transfer level: Needs assistance Equipment used: 1 person hand held assist              Lateral/Scoot Transfers: Mod assist General transfer comment: Pt unable to initiate any scooting without PT assist. Worked on anterior leans first. Then progressed to anterior lean with hip clearance and PT assist. Then progressed to anterior lean with lateral scoot up towards HOB. PT assist for boost/scoot and to reposition hips/LE's after each attempt.    Ambulation/Gait               General Gait Details: Unable to progress to gait training at this time.   Stairs             Wheelchair Mobility     Tilt Bed    Modified Rankin (Stroke Patients Only)       Balance Overall balance assessment: Needs assistance Sitting-balance support: Feet supported, No upper extremity supported Sitting balance-Leahy Scale: Fair Sitting balance - Comments: Sat EOB with CGA. He demonstrated a posterior lean. Postural control: Posterior lean  Communication Communication Communication: Impaired Factors Affecting Communication: Reduced clarity of speech;Hearing impaired  Cognition Arousal: Alert Behavior During Therapy: WFL for tasks assessed/performed   PT - Cognitive impairments: Safety/Judgement, Problem solving, Sequencing                       PT - Cognition Comments: alert, oriented x 4 Following commands: Impaired Following commands impaired: Only follows one step commands  consistently    Cueing Cueing Techniques: Verbal cues, Gestural cues, Tactile cues  Exercises      General Comments General comments (skin integrity, edema, etc.): Noted limb protector rubbing skin and pressure injury forming at edge of limb protector. Foam dressing placed and RN present to assess.      Pertinent Vitals/Pain Pain Assessment Pain Assessment: Faces Faces Pain Scale: Hurts a little bit Pain Location: R residual limb Pain Descriptors / Indicators: Operative site guarding Pain Intervention(s): Limited activity within patient's tolerance, Monitored during session, Repositioned    Home Living                          Prior Function            PT Goals (current goals can now be found in the care plan section) Acute Rehab PT Goals Patient Stated Goal: Have less pain and move better Progress towards PT goals: Progressing toward goals    Frequency    Min 2X/week      PT Plan      Co-evaluation              AM-PAC PT 6 Clicks Mobility   Outcome Measure  Help needed turning from your back to your side while in a flat bed without using bedrails?: A Little Help needed moving from lying on your back to sitting on the side of a flat bed without using bedrails?: A Little Help needed moving to and from a bed to a chair (including a wheelchair)?: Total Help needed standing up from a chair using your arms (e.g., wheelchair or bedside chair)?: Total Help needed to walk in hospital room?: Total Help needed climbing 3-5 steps with a railing? : Total 6 Click Score: 10    End of Session Equipment Utilized During Treatment: Gait belt Activity Tolerance: Patient tolerated treatment well Patient left: in bed;with call bell/phone within reach;with bed alarm set;Other (comment) (Bed in chair position; pt requested all 4 rails up) Nurse Communication: Mobility status;Need for lift equipment PT Visit Diagnosis: Difficulty in walking, not elsewhere  classified (R26.2);Other abnormalities of gait and mobility (R26.89);Unsteadiness on feet (R26.81)     Time: 9058-8988 PT Time Calculation (min) (ACUTE ONLY): 30 min  Charges:    $Therapeutic Activity: 23-37 mins PT General Charges $$ ACUTE PT VISIT: 1 Visit                     Leita Sable, PT, DPT Acute Rehabilitation Services Secure Chat Preferred Office: [phone]    Leita JONETTA Sable 02/26/2024, 10:28 AM

## 2024-02-27 ENCOUNTER — Inpatient Hospital Stay (HOSPITAL_COMMUNITY)

## 2024-02-27 DIAGNOSIS — R7881 Bacteremia: Secondary | ICD-10-CM | POA: Diagnosis not present

## 2024-02-27 DIAGNOSIS — L03115 Cellulitis of right lower limb: Secondary | ICD-10-CM | POA: Diagnosis not present

## 2024-02-27 DIAGNOSIS — K529 Noninfective gastroenteritis and colitis, unspecified: Secondary | ICD-10-CM | POA: Diagnosis not present

## 2024-02-27 DIAGNOSIS — E11628 Type 2 diabetes mellitus with other skin complications: Secondary | ICD-10-CM | POA: Diagnosis not present

## 2024-02-27 LAB — COMPREHENSIVE METABOLIC PANEL WITH GFR
ALT: 13 U/L (ref 0–44)
AST: 22 U/L (ref 15–41)
Albumin: 2.6 g/dL — ABNORMAL LOW (ref 3.5–5.0)
Alkaline Phosphatase: 48 U/L (ref 38–126)
Anion gap: 11 (ref 5–15)
BUN: 73 mg/dL — ABNORMAL HIGH (ref 8–23)
CO2: 21 mmol/L — ABNORMAL LOW (ref 22–32)
Calcium: 7.8 mg/dL — ABNORMAL LOW (ref 8.9–10.3)
Chloride: 99 mmol/L (ref 98–111)
Creatinine, Ser: 3.27 mg/dL — ABNORMAL HIGH (ref 0.61–1.24)
GFR, Estimated: 19 mL/min — ABNORMAL LOW
Glucose, Bld: 286 mg/dL — ABNORMAL HIGH (ref 70–99)
Potassium: 5.3 mmol/L — ABNORMAL HIGH (ref 3.5–5.1)
Sodium: 131 mmol/L — ABNORMAL LOW (ref 135–145)
Total Bilirubin: 0.3 mg/dL (ref 0.0–1.2)
Total Protein: 5.7 g/dL — ABNORMAL LOW (ref 6.5–8.1)

## 2024-02-27 LAB — CBC WITH DIFFERENTIAL/PLATELET
Abs Immature Granulocytes: 0.08 K/uL — ABNORMAL HIGH (ref 0.00–0.07)
Basophils Absolute: 0 K/uL (ref 0.0–0.1)
Basophils Relative: 0 %
Eosinophils Absolute: 0 K/uL (ref 0.0–0.5)
Eosinophils Relative: 0 %
HCT: 27 % — ABNORMAL LOW (ref 39.0–52.0)
Hemoglobin: 9.1 g/dL — ABNORMAL LOW (ref 13.0–17.0)
Immature Granulocytes: 1 %
Lymphocytes Relative: 8 %
Lymphs Abs: 0.6 K/uL — ABNORMAL LOW (ref 0.7–4.0)
MCH: 26.9 pg (ref 26.0–34.0)
MCHC: 33.7 g/dL (ref 30.0–36.0)
MCV: 79.9 fL — ABNORMAL LOW (ref 80.0–100.0)
Monocytes Absolute: 0.1 K/uL (ref 0.1–1.0)
Monocytes Relative: 1 %
Neutro Abs: 7.1 K/uL (ref 1.7–7.7)
Neutrophils Relative %: 90 %
Platelets: 295 K/uL (ref 150–400)
RBC: 3.38 MIL/uL — ABNORMAL LOW (ref 4.22–5.81)
RDW: 16.1 % — ABNORMAL HIGH (ref 11.5–15.5)
Smear Review: NORMAL
WBC: 7.9 K/uL (ref 4.0–10.5)
nRBC: 0 % (ref 0.0–0.2)

## 2024-02-27 LAB — MAGNESIUM: Magnesium: 1.9 mg/dL (ref 1.7–2.4)

## 2024-02-27 LAB — CK: Total CK: 46 U/L — ABNORMAL LOW (ref 49–397)

## 2024-02-27 LAB — GLUCOSE, CAPILLARY
Glucose-Capillary: 277 mg/dL — ABNORMAL HIGH (ref 70–99)
Glucose-Capillary: 356 mg/dL — ABNORMAL HIGH (ref 70–99)
Glucose-Capillary: 264 mg/dL — ABNORMAL HIGH (ref 70–99)
Glucose-Capillary: 345 mg/dL — ABNORMAL HIGH (ref 70–99)

## 2024-02-27 LAB — SURGICAL PATHOLOGY

## 2024-02-27 LAB — PHOSPHORUS: Phosphorus: 4.3 mg/dL (ref 2.5–4.6)

## 2024-02-27 MED ORDER — INSULIN ASPART 100 UNIT/ML IJ SOLN
5.0000 [IU] | Freq: Three times a day (TID) | INTRAMUSCULAR | Status: DC
  Administered 2024-02-27 – 2024-02-28 (×3): 5 [IU] via SUBCUTANEOUS
  Filled 2024-02-27 (×3): qty 5

## 2024-02-27 MED ORDER — SODIUM ZIRCONIUM CYCLOSILICATE 10 G PO PACK
10.0000 g | PACK | Freq: Once | ORAL | Status: AC
  Administered 2024-02-27: 10 g via ORAL
  Filled 2024-02-27: qty 1

## 2024-02-27 MED ORDER — METHYLPREDNISOLONE SODIUM SUCC 125 MG IJ SOLR
60.0000 mg | Freq: Every day | INTRAMUSCULAR | Status: DC
  Administered 2024-02-27 – 2024-02-28 (×2): 60 mg via INTRAVENOUS
  Filled 2024-02-27 (×2): qty 2

## 2024-02-27 MED ORDER — INSULIN GLARGINE 100 UNIT/ML ~~LOC~~ SOLN
15.0000 [IU] | Freq: Every day | SUBCUTANEOUS | Status: DC
  Administered 2024-02-27: 15 [IU] via SUBCUTANEOUS
  Filled 2024-02-27 (×2): qty 0.15

## 2024-02-27 MED ORDER — FUROSEMIDE 10 MG/ML IJ SOLN
40.0000 mg | Freq: Once | INTRAMUSCULAR | Status: AC
  Administered 2024-02-27: 40 mg via INTRAVENOUS
  Filled 2024-02-27: qty 4

## 2024-02-27 NOTE — Progress Notes (Signed)
 Occupational Therapy Treatment Patient Details Name: [name] MRN: [id number] DOB: 1950-07-16 Today's Date: 02/27/2024   History of present illness [name] is a 74 y.o. M adm 02/17/24 for concerns of sepsis with worsening right diabetic foot infection with abscess. MRI showed open wound along the medial aspect of the hindfoot with an underlying large complex abscess involving the subcutaneous tissues and also the underlying musculature as well as osteomyelitis involving the medial cuneiform and the calcaneus. 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  Readmit from SNF with R BKA 02/21/23.   OT comments  Patient still struggles a little with technique for lateral scoots.  Cues to lean forward and push through L leg and both arms.  Mod to Max A for lateral scoot to the recliner.  OT will continue efforts to improve transfers to increase independence with ADL.  Patient will benefit from continued inpatient follow up therapy, <3 hours/day.      If plan is discharge home, recommend the following:  Assist for transportation;A lot of help with bathing/dressing/bathroom;Two people to help with walking and/or transfers   Equipment Recommendations  None recommended by OT    Recommendations for Other Services      Precautions / Restrictions Precautions Precautions: Fall Required Braces or Orthoses: Other Brace Other Brace: Hanger limb protector Restrictions Weight Bearing Restrictions Per Provider Order: Yes RLE Weight Bearing Per Provider Order: Non weight bearing       Mobility Bed Mobility Overal bed mobility: Needs Assistance Bed Mobility: Supine to Sit     Supine to sit: Min assist          Transfers Overall transfer level: Needs assistance Equipment used: 1 person hand held assist              Lateral/Scoot Transfers: Mod  assist, Max assist       Balance Overall balance assessment: Needs assistance Sitting-balance support: Feet supported, No upper extremity supported Sitting balance-Leahy Scale: Fair                                     ADL either performed or assessed with clinical judgement   ADL Overall ADL's : Needs assistance/impaired                 Upper Body Dressing : Sitting;Minimal assistance   Lower Body Dressing: Maximal assistance;Sitting/lateral leans;Bed level   Toilet Transfer: Maximal assistance;+2 for physical assistance                  Extremity/Trunk Assessment Upper Extremity Assessment Upper Extremity Assessment: Generalized weakness   Lower Extremity Assessment Lower Extremity Assessment: Defer to PT evaluation        Vision Patient Visual Report: No change from baseline     Perception Perception Perception: Not tested   Praxis Praxis Praxis: Not tested   Communication Communication Communication: Impaired Factors Affecting Communication: Reduced clarity of speech;Hearing impaired   Cognition Arousal: Alert Behavior During Therapy: WFL for tasks assessed/performed Cognition: No apparent impairments Difficult to assess due to: Impaired communication           OT - Cognition Comments: Difficult to understand, but appears to be intact                 Following commands: Intact  Following commands impaired: Only follows one step commands consistently      Cueing   Cueing Techniques: Verbal cues, Gestural cues  Exercises      Shoulder Instructions       General Comments      Pertinent Vitals/ Pain       Pain Assessment Pain Assessment: Faces Faces Pain Scale: No hurt Pain Intervention(s): Monitored during session                                                          Frequency           Progress Toward Goals  OT Goals(current goals can now be found in the care plan  section)  Progress towards OT goals: Progressing toward goals  Acute Rehab OT Goals OT Goal Formulation: With patient Time For Goal Achievement: 03/07/24 Potential to Achieve Goals: Fair  Plan      Co-evaluation                 AM-PAC OT 6 Clicks Daily Activity     Outcome Measure   Help from another person eating meals?: None Help from another person taking care of personal grooming?: None Help from another person toileting, which includes using toliet, bedpan, or urinal?: A Lot Help from another person bathing (including washing, rinsing, drying)?: A Lot Help from another person to put on and taking off regular upper body clothing?: A Little Help from another person to put on and taking off regular lower body clothing?: A Lot 6 Click Score: 17    End of Session    OT Visit Diagnosis: Unsteadiness on feet (R26.81);Muscle weakness (generalized) (M62.81);Dizziness and giddiness (R42)   Activity Tolerance Patient tolerated treatment well   Patient Left in chair;with call bell/phone within reach   Nurse Communication Mobility status;Need for lift equipment        Time: [phone] OT Time Calculation (min): 23 min  Charges: OT General Charges $OT Visit: 1 Visit OT Treatments $Therapeutic Activity: 8-22 mins  02/27/2024  RP, OTR/L  Acute Rehabilitation Services  Office:  [phone]   [name] 02/27/2024, 8:38 AM

## 2024-02-27 NOTE — TOC Progression Note (Signed)
 Transition of Care [facility]) - Progression Note    Patient Details  Name: [name] MRN: [id number] Date of Birth: [birth year]  [facility] of Care [facility]) CM/SW Contact  Inocente GORMAN Kindle, LCSW Phone Number: 02/27/2024, 3:47 PM  Clinical Narrative:    Insurance requesting updated MD note and therapy notes indicating OOB attempts. CSW uploaded notes to insurance portal as requested.    Expected Discharge Plan: Skilled Nursing Facility Barriers to Discharge: Continued Medical Work up, SNF Pending bed offer, English As A Second Language Teacher               Expected Discharge Plan and Services In-house Referral: Clinical Social Work Discharge Planning Services: CM Consult   Living arrangements for the past 2 months: Single Family Home                                       Social Drivers of Health (SDOH) Interventions SDOH Screenings   Food Insecurity: No Food Insecurity (02/17/2024)  Housing: Low Risk (02/17/2024)  Transportation Needs: No Transportation Needs (02/17/2024)  Utilities: Not At Risk (02/17/2024)  Financial Resource Strain: Low Risk (12/13/2021)  Social Connections: Socially Isolated (02/17/2024)  Tobacco Use: Low Risk (02/21/2024)    Readmission Risk Interventions    11/30/2021    2:51 PM  Readmission Risk Prevention Plan  Transportation Screening Complete  HRI or Home Care Consult Complete  Social Work Consult for Recovery Care Planning/Counseling Complete  Palliative Care Screening Not Applicable  Medication Review Oceanographer) Complete

## 2024-02-27 NOTE — Plan of Care (Signed)
 " Problem: Education: Goal: Knowledge of General Education information will improve Description: Including pain rating scale, medication(s)/side effects and non-pharmacologic comfort measures Outcome: Progressing   Problem: Health Behavior/Discharge Planning: Goal: Ability to manage health-related needs will improve Outcome: Progressing   Problem: Clinical Measurements: Goal: Ability to maintain clinical measurements within normal limits will improve Outcome: Progressing Goal: Will remain free from infection Outcome: Progressing Goal: Diagnostic test results will improve Outcome: Progressing Goal: Respiratory complications will improve Outcome: Progressing Goal: Cardiovascular complication will be avoided Outcome: Progressing   Problem: Activity: Goal: Risk for activity intolerance will decrease Outcome: Progressing   Problem: Nutrition: Goal: Adequate nutrition will be maintained Outcome: Progressing   Problem: Coping: Goal: Level of anxiety will decrease Outcome: Progressing   Problem: Elimination: Goal: Will not experience complications related to bowel motility Outcome: Progressing Goal: Will not experience complications related to urinary retention Outcome: Progressing   Problem: Pain Managment: Goal: General experience of comfort will improve and/or be controlled Outcome: Progressing   Problem: Safety: Goal: Ability to remain free from injury will improve Outcome: Progressing   Problem: Skin Integrity: Goal: Risk for impaired skin integrity will decrease Outcome: Progressing   Problem: Education: Goal: Ability to describe self-care measures that may prevent or decrease complications (Diabetes Survival Skills Education) will improve Outcome: Progressing Goal: Individualized Educational Video(s) Outcome: Progressing   Problem: Coping: Goal: Ability to adjust to condition or change in health will improve Outcome: Progressing   Problem: Fluid  Volume: Goal: Ability to maintain a balanced intake and output will improve Outcome: Progressing   Problem: Health Behavior/Discharge Planning: Goal: Ability to identify and utilize available resources and services will improve Outcome: Progressing Goal: Ability to manage health-related needs will improve Outcome: Progressing   Problem: Metabolic: Goal: Ability to maintain appropriate glucose levels will improve Outcome: Progressing   Problem: Nutritional: Goal: Maintenance of adequate nutrition will improve Outcome: Progressing Goal: Progress toward achieving an optimal weight will improve Outcome: Progressing   Problem: Skin Integrity: Goal: Risk for impaired skin integrity will decrease Outcome: Progressing   Problem: Tissue Perfusion: Goal: Adequacy of tissue perfusion will improve Outcome: Progressing   Problem: Education: Goal: Knowledge of the prescribed therapeutic regimen will improve Outcome: Progressing   Problem: Bowel/Gastric: Goal: Gastrointestinal status for postoperative course will improve Outcome: Progressing   Problem: Cardiac: Goal: Ability to maintain an adequate cardiac output Outcome: Progressing Goal: Will show no evidence of cardiac arrhythmias Outcome: Progressing   Problem: Nutritional: Goal: Will attain and maintain optimal nutritional status Outcome: Progressing   Problem: Neurological: Goal: Will regain or maintain usual level of consciousness Outcome: Progressing   Problem: Clinical Measurements: Goal: Ability to maintain clinical measurements within normal limits Outcome: Progressing Goal: Postoperative complications will be avoided or minimized Outcome: Progressing   Problem: Respiratory: Goal: Will regain and/or maintain adequate ventilation Outcome: Progressing Goal: Respiratory status will improve Outcome: Progressing   Problem: Skin Integrity: Goal: Demonstrates signs of wound healing without infection Outcome:  Progressing   Problem: Urinary Elimination: Goal: Will remain free from infection Outcome: Progressing Goal: Ability to achieve and maintain adequate urine output Outcome: Progressing   Problem: Education: Goal: Knowledge of the prescribed therapeutic regimen will improve Outcome: Progressing Goal: Ability to verbalize activity precautions or restrictions will improve Outcome: Progressing Goal: Understanding of discharge needs will improve Outcome: Progressing   Problem: Activity: Goal: Ability to perform//tolerate increased activity and mobilize with assistive devices will improve Outcome: Progressing   Problem: Clinical Measurements: Goal: Postoperative complications will be avoided or minimized  Outcome: Progressing   Problem: Self-Care: Goal: Ability to meet self-care needs will improve Outcome: Progressing   "

## 2024-02-27 NOTE — Progress Notes (Signed)
 " PROGRESS NOTE    [name]  FMW:[id number] DOB: 1950-07-15 DOA: 02/17/2024 PCP: Marvine Rush, MD   Brief Narrative:  The patient is a 74 year old with history of CVA with residual slurred speech, CKD 3 AA, chronic osteomyelitis s/p right TMA, HTN, DM 2 admitted to the hospital for generally feeling sick and having nausea/vomiting.  Workup showed concerns of sepsis with worsening right diabetic foot infection with abscess.  Dr. Harden was consulted and patient was transferred from [facility] to [facility] for surgical intervention.  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  Hospital course complicated by gram-positive bacteremia, echocardiogram unremarkable.   Underwent TEE and showed no Vegetation. ID recommending transitioning to po Abx on 1/12 for 3 more weeks.  Renal function and he feels volume overloaded so we will give him diuresis.  Subsequently he has a rash that is noted but this is slowly improving with Solu-Medrol .  PT OT recommending SNF and appears to be stable for discharge and will need insurance authorization.  Assessment and Plan:  Sepsis 2/2 to Right Diabetic foot infection associated with abscess and osteomyelitis s/p Right BKA MRSA Bacteremia, Improving  -Orthopedic consulted.  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  Postop recommendations by their service. -ID team was following for Bacteremia and now signed off as Abx Recommendations have been made -Repeat blood cultures 1/5 - NGTD -Having intermittent Temperatures and had another Temperature this AM; WBC is not elevated.  -Echocardiogram showing preserved EF 65%, grade 1 DD.  No evidence of vegetation -TEE done and showed no Vegetation -ID recommended continuing IV Daptomycin  and have now Transitioned to po Linezolid  on 02/26/24 to finish 3 more weeks with end date being 03/18/2024.  -Initial CXR done and showed Patchy left lower lobe opacity, possibly representing atelectasis and/or infection. Repeat CXR done and  showed  -PT/OT recommending SNF and likely will be Monday as we will transition him from IV antibiotics to oral antibiotics at that time. PT/OT needed to work with the patient today so [employer] can be started.   Normocytic Anemia: Drop in Hgb likely a a combination of dilutional and likely postsurgical blood loss.  Closely monitor.  Transfuse hemoglobin less than 7. Hgb/Hct Trend fairly sable now and is 9.1/27.0 on last check. Checked Anemia Panel and showed Iron level 14, UIBC 189, TIBC 203, saturation ration 7%, ferritin level 307, folate level 7.3. CTM for S/Sx of Bleeding; No overt bleeding noted. Repeat CBC in the AM    Type 2 Diabetes Mellitus with neurological complications, uncontrolled with Hyperglycemia -A1c 9.8.  Sliding scale and Accu-Chek.  Will adjust as necessary and increased Semglee  to 15 units at bedtime, Continuing Moderate Novolog  SSI AC/HS and increasing Novolog  to 5 mg TIDwm. CBG Trend elevated and ranging from 222-379 checks. Will need to continually adjust given that Blood Sugars to be elevated in the setting of Solumedrol   AKI on CKD stage 3b / Metabolic Acidosis: Baseline creatinine around 2.0, admission creatinine 4.82; Given IV fluids early on admit and renal function was slowly improving but he appears volume overloaded so will continue gentle Diuresis and give another dose Lasix . BUN/Cr Trend: Recent Labs  Lab 02/21/24 0515 02/22/24 0216 02/23/24 0535 02/24/24 0456 02/25/24 0541 02/26/24 0526 02/27/24 0513  BUN 49* 45* 39* 44* 56* 66* 73*  CREATININE 3.49* 3.42* 3.46* 3.29* 3.56* 3.44* 3.27*  -Now improved and CO2 is 22, AG is 9, Chloride Level is 104  -Renal ultrasound does not show acute pathology; Will  repeat IV Lasix  40 mg x1 and will need to re-evaluate in the AM for additional Lasix   -Avoid Nephrotoxic Medications, Contrast Dyes, Hypotension and Dehydration to Ensure Adequate Renal Perfusion and will need to Renally Adjust Meds. CTM & Trend  Renal Function carefully and repeat CMP in the AM    Gastroenteritis:  Improved. C. difficile antigen positive, but toxigenic PCR is negative.  GI panel is neg. CTM for S/Sx and Sx Improved    Diffuse Macular Rash: Unclear etiology but ? Medication Related. Patient thought it was getting worse but after addition of IV Solumedrol 125 mg x2 it seems to be improving. Change to IV Solumedrol 60 mg Daily w/ Judicious use of Steroids given recent bacteremia. C/w .  Will need outpatient follow-up with Dermatology at some point   Essential Hypertension  Sinus Tachycardia -C/w Metoprolol  Succinate 50 mg po Daily and IV Metoprolol  Tartrate 5 mg IV q4hprn for HR >110. CTM BP per Protocol. Last BP reading was 135/61 and HR has been ranging from 77-85  Hyponatremia Hypomagnesemia Hypokalemia -> Hyperkalemia: Na+, K+, and Mag Level Trend:  Recent Labs  Lab 02/21/24 0515 02/22/24 0216 02/23/24 0535 02/24/24 0456 02/25/24 0541 02/26/24 0526 02/27/24 0513  NA 137 137 137 133* 135 131* 131*  K 4.1 4.0 3.8 3.9 4.4 5.0 5.3*  MG 1.7 1.5* 1.5* 1.9 1.9 2.0 1.9  -Give a Dose of po Lokelma  10 grams x1. CTM and Replete as Necessary. Repeat CMP and Mag in the AM   Hypoalbuminemia: Patient's Albumin Lvl 3.6 -> 2.3 x2 -> 2.6 x2. CTM & Trend a& Repeat CMP in the AM    DVT prophylaxis: heparin  injection 5,000 Units Start: 02/22/24 0600 SCD's Start: 02/21/24 1629    Code Status: Full Code Family Communication: No family present @ bedside  Disposition Plan:  Level of care: Telemetry Status is: Inpatient Remains inpatient appropriate because: Needs SNF and awaiting insurance authorization   Consultants:  Orthopedic Surgery Infectious Diseases  Procedures:  As delineated as above; Right BKA   Antimicrobials:  Anti-infectives (From admission, onward)    Start     Dose/Rate Route Frequency Ordered Stop   02/26/24 1000  linezolid  (ZYVOX ) tablet 600 mg        600 mg Oral Every 12 hours 02/23/24 1650  03/19/24 0959   02/21/24 1650  DAPTOmycin  (CUBICIN ) IVPB 700 mg/122mL premix        8 mg/kg  90.2 kg 200 mL/hr over 30 Minutes Intravenous Every 48 hours 02/21/24 1650 02/25/24 1708   02/21/24 1153  ceFAZolin  (ANCEF ) 2-4 GM/100ML-% IVPB       Note to Pharmacy: Jonda Bottoms M: cabinet override      02/21/24 1153 02/21/24 2359   02/20/24 0930  ceFAZolin  (ANCEF ) IVPB 2g/100 mL premix  Status:  Discontinued        2 g 200 mL/hr over 30 Minutes Intravenous On call to O.R. 02/20/24 0927 02/21/24 0559   02/19/24 1215  DAPTOmycin  (CUBICIN ) IVPB 700 mg/121mL premix  Status:  Discontinued        8 mg/kg  90.2 kg 200 mL/hr over 30 Minutes Intravenous Every 48 hours 02/19/24 1201 02/21/24 1650   02/18/24 1000  linezolid  (ZYVOX ) IVPB 600 mg  Status:  Discontinued        600 mg 300 mL/hr over 60 Minutes Intravenous Every 12 hours 02/17/24 1912 02/19/24 1159   02/18/24 0800  Ampicillin -Sulbactam (UNASYN ) 3 g in sodium chloride  0.9 % 100 mL IVPB  Status:  Discontinued  3 g 200 mL/hr over 30 Minutes Intravenous Every 12 hours 02/17/24 1912 02/22/24 1014   02/17/24 1715  cefTRIAXone  (ROCEPHIN ) 2 g in sodium chloride  0.9 % 100 mL IVPB  Status:  Discontinued        2 g 200 mL/hr over 30 Minutes Intravenous Every 24 hours 02/17/24 1709 02/17/24 1916   02/17/24 1430  vancomycin  (VANCOCIN ) IVPB 1000 mg/200 mL premix  Status:  Discontinued        1,000 mg 200 mL/hr over 60 Minutes Intravenous  Once 02/17/24 1421 02/17/24 1424   02/17/24 1430  vancomycin  (VANCOREADY) IVPB 2000 mg/400 mL        2,000 mg 200 mL/hr over 120 Minutes Intravenous  Once 02/17/24 1424 02/17/24 1700       Subjective: Seen and examined at bedside he is laying in the chair next to the bed and feels okay.  Denies any lightheadedness or dizziness.  Thinks his rash is doing better.  Daughter states that he has only used Benadryl  yesterday morning.  Denies chest pain and feels okay.  Objective: Vitals:   02/26/24 2121 02/27/24  0500 02/27/24 0805 02/27/24 1146  BP: 129/64 (!) 141/68 (!) 145/75 135/61  Pulse: 81 77 85   Resp: 17 17 18 19   Temp: 98.7 F (37.1 C) 98 F (36.7 C)  98.4 F (36.9 C)  TempSrc: Oral Oral Oral Oral  SpO2: 95% 95% 100% 96%  Weight:      Height:        Intake/Output Summary (Last 24 hours) at 02/27/2024 1506 Last data filed at 02/27/2024 1300 Gross per 24 hour  Intake --  Output 1275 ml  Net -1275 ml   Filed Weights   02/17/24 1420 02/21/24 1215  Weight: 90.2 kg 90.2 kg   Examination: Physical Exam:  Constitutional: WN/WD overweight chronically ill-appearing elderly Caucasian male in no acute distress appears calm. Respiratory: Diminished to auscultation bilaterally with some coarse breath sounds and has some slight crackles and mild rhonchi but no appreciable wheezing or rales. Normal respiratory effort and patient is not tachypenic. No accessory muscle use.  Not wearing supplemental oxygen via nasal cannula Cardiovascular: RRR, no murmurs / rubs / gallops. S1 and S2 auscultated. 1+ LE Edema on Left leg; Has a Right BKA in a stump shrinker Abdomen: Soft, non-tender, Distended 2/2 body habitus. Bowel sounds positive.  GU: Deferred. Musculoskeletal: R BKA Skin: Has a diffuse Rash on hs back, arms, and leg Neurologic: CN 2-12 grossly intact with no focal deficits but has some dysarthria and is a little difficult to understand at times. Romberg sign cerebellar reflexes not assessed.  Psychiatric: Awake and alert and appears calm  Data Reviewed: I have personally reviewed following labs and imaging studies  CBC: Recent Labs  Lab 02/23/24 0535 02/24/24 0456 02/25/24 0541 02/26/24 0526 02/27/24 0513  WBC 2.9* 3.5* 5.6 3.7* 7.9  NEUTROABS  --  2.0 4.1 3.4 7.1  HGB 7.3* 8.9* 8.4* 8.6* 9.1*  HCT 22.1* 26.7* 25.2* 25.6* 27.0*  MCV 82.2 81.7 81.6 81.0 79.9*  PLT 202 237 245 244 295   Basic Metabolic Panel: Recent Labs  Lab 02/23/24 0535 02/24/24 0456 02/25/24 0541  02/26/24 0526 02/27/24 0513  NA 137 133* 135 131* 131*  K 3.8 3.9 4.4 5.0 5.3*  CL 108 102 104 98 99  CO2 21* 22 22 22  21*  GLUCOSE 179* 131* 109* 320* 286*  BUN 39* 44* 56* 66* 73*  CREATININE 3.46* 3.29* 3.56* 3.44* 3.27*  CALCIUM   7.1* 7.4* 7.4* 8.0* 7.8*  MG 1.5* 1.9 1.9 2.0 1.9  PHOS  --  4.0 3.6 4.2 4.3   GFR: Estimated Creatinine Clearance: 23.7 mL/min (A) (by C-G formula based on SCr of 3.27 mg/dL (H)). Liver Function Tests: Recent Labs  Lab 02/24/24 0456 02/25/24 0541 02/26/24 0526 02/27/24 0513  AST 23 21 19 22   ALT 6 8 7 13   ALKPHOS 43 43 51 48  BILITOT <0.2 0.2 0.2 0.3  PROT 5.3* 5.2* 6.0* 5.7*  ALBUMIN 2.3* 2.3* 2.6* 2.6*   No results for input(s): LIPASE, AMYLASE in the last 168 hours. No results for input(s): AMMONIA in the last 168 hours. Coagulation Profile: No results for input(s): INR, PROTIME in the last 168 hours. Cardiac Enzymes: Recent Labs  Lab 02/27/24 0513  CKTOTAL 46*   BNP (last 3 results) No results for input(s): PROBNP in the last 8760 hours. HbA1C: No results for input(s): HGBA1C in the last 72 hours. CBG: Recent Labs  Lab 02/26/24 1203 02/26/24 1708 02/26/24 2137 02/27/24 0803 02/27/24 1144  GLUCAP 242* 222* 225* 277* 356*   Lipid Profile: No results for input(s): CHOL, HDL, LDLCALC, TRIG, CHOLHDL, LDLDIRECT in the last 72 hours. Thyroid  Function Tests: No results for input(s): TSH, T4TOTAL, FREET4, T3FREE, THYROIDAB in the last 72 hours. Anemia Panel: No results for input(s): VITAMINB12, FOLATE, FERRITIN, TIBC, IRON, RETICCTPCT in the last 72 hours. Sepsis Labs: No results for input(s): PROCALCITON, LATICACIDVEN in the last 168 hours.  Recent Results (from the past 240 hours)  C Difficile Quick Screen w PCR reflex     Status: Abnormal   Collection Time: 02/18/24  6:35 PM   Specimen: Stool  Result Value Ref Range Status   C Diff antigen POSITIVE (A) NEGATIVE Final    C Diff toxin NEGATIVE NEGATIVE Final   C Diff interpretation Results are indeterminate. See PCR results.  Final    Comment: Performed at [facility], [address], [place], [place] [address]  C. Diff by PCR, Reflexed     Status: None   Collection Time: 02/18/24  6:35 PM  Result Value Ref Range Status   Toxigenic C. Difficile by PCR NEGATIVE NEGATIVE Final    Comment: Patient is colonized with non toxigenic C. difficile. May not need treatment unless significant symptoms are present.   Hypervirulent Strain PRESUMPTIVE NEGATIVE PRESUMPTIVE NEGATIVE Final    Comment: Performed at [facility], [address], [place], [place] [address]  Gastrointestinal Panel by PCR , Stool     Status: None   Collection Time: 02/18/24  8:02 PM  Result Value Ref Range Status   Campylobacter species NOT DETECTED NOT DETECTED Final   Plesimonas shigelloides NOT DETECTED NOT DETECTED Final   Salmonella species NOT DETECTED NOT DETECTED Final   Yersinia enterocolitica NOT DETECTED NOT DETECTED Final   Vibrio species NOT DETECTED NOT DETECTED Final   Vibrio cholerae NOT DETECTED NOT DETECTED Final   Enteroaggregative E coli (EAEC) NOT DETECTED NOT DETECTED Final   Enteropathogenic E coli (EPEC) NOT DETECTED NOT DETECTED Final   Enterotoxigenic E coli (ETEC) NOT DETECTED NOT DETECTED Final   Shiga like toxin producing E coli (STEC) NOT DETECTED NOT DETECTED Final   Shigella/Enteroinvasive E coli (EIEC) NOT DETECTED NOT DETECTED Final   Cryptosporidium NOT DETECTED NOT DETECTED Final   Cyclospora cayetanensis NOT DETECTED NOT DETECTED Final   Entamoeba histolytica NOT DETECTED NOT DETECTED Final   Giardia lamblia NOT DETECTED NOT DETECTED Final   Adenovirus F40/41  NOT DETECTED NOT DETECTED Final   Astrovirus NOT DETECTED NOT DETECTED Final   Norovirus GI/GII NOT DETECTED NOT DETECTED Final   Rotavirus A NOT DETECTED NOT DETECTED Final   Sapovirus (I, II, IV, and V) NOT DETECTED NOT DETECTED Final     Comment: Performed at [facility], [address] [place], [place] [address]  Respiratory (~20 pathogens) panel by PCR     Status: None   Collection Time: 02/19/24 12:58 AM   Specimen: Nasopharyngeal Swab; Respiratory  Result Value Ref Range Status   Adenovirus NOT DETECTED NOT DETECTED Final   Coronavirus 229E NOT DETECTED NOT DETECTED Final    Comment: (NOTE) The Coronavirus on the Respiratory Panel, DOES NOT test for the novel  Coronavirus (2019 nCoV)    Coronavirus HKU1 NOT DETECTED NOT DETECTED Final   Coronavirus NL63 NOT DETECTED NOT DETECTED Final   Coronavirus OC43 NOT DETECTED NOT DETECTED Final   Metapneumovirus NOT DETECTED NOT DETECTED Final   Rhinovirus / Enterovirus NOT DETECTED NOT DETECTED Final   Influenza A NOT DETECTED NOT DETECTED Final   Influenza B NOT DETECTED NOT DETECTED Final   Parainfluenza Virus 1 NOT DETECTED NOT DETECTED Final   Parainfluenza Virus 2 NOT DETECTED NOT DETECTED Final   Parainfluenza Virus 3 NOT DETECTED NOT DETECTED Final   Parainfluenza Virus 4 NOT DETECTED NOT DETECTED Final   Respiratory Syncytial Virus NOT DETECTED NOT DETECTED Final   Bordetella pertussis NOT DETECTED NOT DETECTED Final   Bordetella Parapertussis NOT DETECTED NOT DETECTED Final   Chlamydophila pneumoniae NOT DETECTED NOT DETECTED Final   Mycoplasma pneumoniae NOT DETECTED NOT DETECTED Final    Comment: Performed at [facility], [address] [place], [place] [address]  Culture, blood (Routine X 2) w Reflex to ID Panel     Status: None   Collection Time: 02/19/24  2:39 AM   Specimen: BLOOD RIGHT HAND  Result Value Ref Range Status   Specimen Description BLOOD RIGHT HAND  Final   Special Requests   Final    BOTTLES DRAWN AEROBIC AND ANAEROBIC Blood Culture adequate volume IMMUNOCOMPROMISED   Culture   Final    NO GROWTH 5 DAYS Performed at [facility], [address], [place], [place] [address]    Report Status 02/24/2024 FINAL  Final   Culture, blood (Routine X 2) w Reflex to ID Panel     Status: None   Collection Time: 02/19/24  2:39 AM   Specimen: BLOOD RIGHT ARM  Result Value Ref Range Status   Specimen Description BLOOD RIGHT ARM  Final   Special Requests   Final    Immunocompromised BOTTLES DRAWN AEROBIC AND ANAEROBIC Blood Culture adequate volume   Culture   Final    NO GROWTH 5 DAYS Performed at [facility], [address], [place], [place] [address]    Report Status 02/24/2024 FINAL  Final  Urine Culture     Status: None   Collection Time: 02/19/24  6:32 AM   Specimen: Urine, Random  Result Value Ref Range Status   Specimen Description URINE, RANDOM  Final   Special Requests URINE, CLEAN CATCH  Final   Culture   Final    NO GROWTH Performed at [facility], [address], [place], [place] [address]    Report Status 02/20/2024 FINAL  Final  Surgical pcr screen     Status: Abnormal   Collection Time: 02/20/24  2:18 PM   Specimen:  Nasal Mucosa; Nasal Swab  Result Value Ref Range Status   MRSA, PCR POSITIVE (A) NEGATIVE Final    Comment: RESULT CALLED TO, READ BACK BY AND VERIFIED WITH: RN CANDIE SHUCK [phone] @ [phone] FH    Staphylococcus aureus POSITIVE (A) NEGATIVE Final    Comment: (NOTE) The Xpert SA Assay (FDA approved for NASAL specimens in patients 66 years of age and older), is one component of a comprehensive surveillance program. It is not intended to diagnose infection nor to guide or monitor treatment. Performed at [facility], [address], [place], [place] [address]     Radiology Studies: DG CHEST PORT 1 VIEW Result Date: 02/26/2024 CLINICAL DATA:  Shortness of breath EXAM: PORTABLE CHEST 1 VIEW COMPARISON:  Chest CT dated 02/23/2024. FINDINGS: There is mild cardiomegaly with mild vascular congestion. Faint bilateral streaky densities concerning for developing infiltrate. Trace bilateral pleural effusions may be present. No pneumothorax. No acute osseous pathology.  IMPRESSION: 1. Mild cardiomegaly with mild vascular congestion. 2. Faint bilateral streaky densities concerning for developing infiltrate. Electronically Signed   By: Vanetta Chou M.D.   On: 02/26/2024 18:31   Scheduled Meds:  acidophilus  1 capsule Oral Daily   ascorbic acid   1,000 mg Oral Daily   benzonatate   100 mg Oral TID   docusate sodium   100 mg Oral Daily   ferrous sulfate   325 mg Oral TID WC   heparin   5,000 Units Subcutaneous Q8H   insulin  aspart  0-15 Units Subcutaneous TID WC   insulin  aspart  0-5 Units Subcutaneous QHS   insulin  aspart  5 Units Subcutaneous TID WC   insulin  glargine  15 Units Subcutaneous QHS   linezolid   600 mg Oral Q12H   methylPREDNISolone  (SOLU-MEDROL ) injection  60 mg Intravenous Daily   metoprolol  succinate  50 mg Oral Daily   nutrition supplement (JUVEN)  1 packet Oral BID BM   sodium chloride  flush  3 mL Intravenous Q12H   Vitamin D  (Ergocalciferol )  50,000 Units Oral QHS   zinc  sulfate (50mg  elemental zinc )  220 mg Oral Daily   Continuous Infusions:   LOS: 10 days   Alejandro Marker, DO [facility] Available via Epic secure chat 7am-7pm After these hours, please refer to coverage provider listed on [email or web address] 02/27/2024, 3:06 PM  "

## 2024-02-27 NOTE — Inpatient Diabetes Management (Signed)
 Inpatient Diabetes Program Recommendations  AACE/ADA: New Consensus Statement on Inpatient Glycemic Control (2015)  Target Ranges:  Prepandial:   less than 140 mg/dL      Peak postprandial:   less than 180 mg/dL (1-2 hours)      Critically ill patients:  140 - 180 mg/dL   Lab Results  Component Value Date   GLUCAP 356 (H) 02/27/2024   HGBA1C 9.8 (H) 01/17/2024    Review of Glycemic Control  Diabetes history: DM2 Outpatient Diabetes medications: Amaryl 4 mg daily Toujeo  20 units daily Actos 30 mg daily Current orders for Inpatient glycemic control: Novolog  0-15 units tid with meals and HS Novolog  3 units tid with meals if eating > 50% Semglee  10 units q HS  HgbA1C - 9.8%   Inpatient Diabetes Program Recommendations:    Consider increasing Novolog  to 5 units TID with meals if eating > 50%  Consider increasing Semglee  to 15 units at bedtime, if FBS > 180 mg/dL.  Continue to follow.   Thank you. Shona Brandy, RD, LDN, CDCES Inpatient Diabetes Coordinator [phone]

## 2024-02-28 DIAGNOSIS — E11628 Type 2 diabetes mellitus with other skin complications: Secondary | ICD-10-CM | POA: Diagnosis not present

## 2024-02-28 DIAGNOSIS — L089 Local infection of the skin and subcutaneous tissue, unspecified: Secondary | ICD-10-CM | POA: Diagnosis not present

## 2024-02-28 LAB — GLUCOSE, CAPILLARY
Glucose-Capillary: 245 mg/dL — ABNORMAL HIGH (ref 70–99)
Glucose-Capillary: 263 mg/dL — ABNORMAL HIGH (ref 70–99)

## 2024-02-28 MED ORDER — POLYETHYLENE GLYCOL 3350 17 G PO PACK: 17.0000 g | PACK | Freq: Every day | ORAL | Status: AC | PRN

## 2024-02-28 MED ORDER — ZINC SULFATE 220 (50 ZN) MG PO CAPS: 220.0000 mg | ORAL_CAPSULE | Freq: Every day | ORAL | Status: AC

## 2024-02-28 MED ORDER — INSULIN GLARGINE 100 UNIT/ML ~~LOC~~ SOLN: 15.0000 [IU] | Freq: Every day | SUBCUTANEOUS | Status: AC

## 2024-02-28 MED ORDER — ASCORBIC ACID 1000 MG PO TABS: 1000.0000 mg | ORAL_TABLET | Freq: Every day | ORAL | Status: AC

## 2024-02-28 MED ORDER — METOPROLOL SUCCINATE ER 50 MG PO TB24: 50.0000 mg | ORAL_TABLET | Freq: Every day | ORAL | Status: AC

## 2024-02-28 MED ORDER — FERROUS SULFATE 325 (65 FE) MG PO TABS: 325.0000 mg | ORAL_TABLET | Freq: Two times a day (BID) | ORAL | Status: AC

## 2024-02-28 MED ORDER — VITAMIN D 25 MCG (1000 UNIT) PO TABS: 2000.0000 [IU] | ORAL_TABLET | Freq: Every day | ORAL | Status: DC

## 2024-02-28 MED ORDER — ONDANSETRON HCL 4 MG/2ML IJ SOLN: 4.0000 mg | Freq: Four times a day (QID) | INTRAMUSCULAR | 0 refills | Status: AC | PRN

## 2024-02-28 MED ORDER — INSULIN ASPART 100 UNIT/ML IJ SOLN: 0.0000 [IU] | Freq: Three times a day (TID) | INTRAMUSCULAR | Status: AC

## 2024-02-28 MED ORDER — LINEZOLID 600 MG PO TABS: 600.0000 mg | ORAL_TABLET | Freq: Two times a day (BID) | ORAL | Status: AC

## 2024-02-28 MED ORDER — OXYCODONE-ACETAMINOPHEN 10-325 MG PO TABS: 1.0000 | ORAL_TABLET | Freq: Four times a day (QID) | ORAL | 0 refills | Status: AC | PRN

## 2024-02-28 MED ORDER — INSULIN ASPART 100 UNIT/ML IJ SOLN: 5.0000 [IU] | Freq: Three times a day (TID) | INTRAMUSCULAR | Status: AC

## 2024-02-28 MED ORDER — INSULIN ASPART 100 UNIT/ML IJ SOLN: 0.0000 [IU] | Freq: Every day | INTRAMUSCULAR | Status: AC

## 2024-02-28 MED ORDER — RISAQUAD PO CAPS: 1.0000 | ORAL_CAPSULE | Freq: Every day | ORAL | Status: AC

## 2024-02-28 NOTE — Progress Notes (Addendum)
 RN attempted to call Ronal Brought, listed as emergency contact for patient to inform of discharge. Unable to speak or leave message.

## 2024-02-28 NOTE — Plan of Care (Signed)
 " Problem: Education: Goal: Knowledge of General Education information will improve Description: Including pain rating scale, medication(s)/side effects and non-pharmacologic comfort measures Outcome: Progressing   Problem: Health Behavior/Discharge Planning: Goal: Ability to manage health-related needs will improve Outcome: Progressing   Problem: Clinical Measurements: Goal: Ability to maintain clinical measurements within normal limits will improve Outcome: Progressing Goal: Will remain free from infection Outcome: Progressing Goal: Diagnostic test results will improve Outcome: Progressing Goal: Respiratory complications will improve Outcome: Progressing Goal: Cardiovascular complication will be avoided Outcome: Progressing   Problem: Activity: Goal: Risk for activity intolerance will decrease Outcome: Progressing   Problem: Nutrition: Goal: Adequate nutrition will be maintained Outcome: Progressing   Problem: Coping: Goal: Level of anxiety will decrease Outcome: Progressing   Problem: Elimination: Goal: Will not experience complications related to bowel motility Outcome: Progressing Goal: Will not experience complications related to urinary retention Outcome: Progressing   Problem: Pain Managment: Goal: General experience of comfort will improve and/or be controlled Outcome: Progressing   Problem: Safety: Goal: Ability to remain free from injury will improve Outcome: Progressing   Problem: Skin Integrity: Goal: Risk for impaired skin integrity will decrease Outcome: Progressing   Problem: Education: Goal: Ability to describe self-care measures that may prevent or decrease complications (Diabetes Survival Skills Education) will improve Outcome: Progressing Goal: Individualized Educational Video(s) Outcome: Progressing   Problem: Coping: Goal: Ability to adjust to condition or change in health will improve Outcome: Progressing   Problem: Fluid  Volume: Goal: Ability to maintain a balanced intake and output will improve Outcome: Progressing   Problem: Health Behavior/Discharge Planning: Goal: Ability to identify and utilize available resources and services will improve Outcome: Progressing Goal: Ability to manage health-related needs will improve Outcome: Progressing   Problem: Metabolic: Goal: Ability to maintain appropriate glucose levels will improve Outcome: Progressing   Problem: Nutritional: Goal: Maintenance of adequate nutrition will improve Outcome: Progressing Goal: Progress toward achieving an optimal weight will improve Outcome: Progressing   Problem: Skin Integrity: Goal: Risk for impaired skin integrity will decrease Outcome: Progressing   Problem: Tissue Perfusion: Goal: Adequacy of tissue perfusion will improve Outcome: Progressing   Problem: Education: Goal: Knowledge of the prescribed therapeutic regimen will improve Outcome: Progressing   Problem: Bowel/Gastric: Goal: Gastrointestinal status for postoperative course will improve Outcome: Progressing   Problem: Cardiac: Goal: Ability to maintain an adequate cardiac output Outcome: Progressing Goal: Will show no evidence of cardiac arrhythmias Outcome: Progressing   Problem: Nutritional: Goal: Will attain and maintain optimal nutritional status Outcome: Progressing   Problem: Neurological: Goal: Will regain or maintain usual level of consciousness Outcome: Progressing   Problem: Clinical Measurements: Goal: Ability to maintain clinical measurements within normal limits Outcome: Progressing Goal: Postoperative complications will be avoided or minimized Outcome: Progressing   Problem: Respiratory: Goal: Will regain and/or maintain adequate ventilation Outcome: Progressing Goal: Respiratory status will improve Outcome: Progressing   Problem: Skin Integrity: Goal: Demonstrates signs of wound healing without infection Outcome:  Progressing   Problem: Urinary Elimination: Goal: Will remain free from infection Outcome: Progressing Goal: Ability to achieve and maintain adequate urine output Outcome: Progressing   Problem: Education: Goal: Knowledge of the prescribed therapeutic regimen will improve Outcome: Progressing Goal: Ability to verbalize activity precautions or restrictions will improve Outcome: Progressing Goal: Understanding of discharge needs will improve Outcome: Progressing   Problem: Activity: Goal: Ability to perform//tolerate increased activity and mobilize with assistive devices will improve Outcome: Progressing   Problem: Clinical Measurements: Goal: Postoperative complications will be avoided or minimized  Outcome: Progressing   Problem: Self-Care: Goal: Ability to meet self-care needs will improve Outcome: Progressing   "

## 2024-02-28 NOTE — Progress Notes (Signed)
 " PROGRESS NOTE    [name]  FMW:[id number] DOB: 03/24/1950 DOA: 02/17/2024 PCP: Marvine Rush, MD   Brief Narrative:   74 year old with history of CVA with residual slurred speech, CKD 3 AA, chronic osteomyelitis s/p right TMA, HTN, DM 2 admitted to the hospital for generally feeling sick and having nausea/vomiting.  Workup showed concerns of sepsis with worsening right diabetic foot infection with abscess.  Dr. Harden was consulted and patient was transferred from [facility] to [facility] for surgical intervention.  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  Hospital course complicated by gram-positive bacteremia, echocardiogram unremarkable.    Underwent TEE and showed no Vegetation. ID recommending transitioning to po Abx on 1/12 for 3 more weeks.  PT OT recommending SNF and appears to be stable for discharge and will need insurance authorization.  Assessment & Plan:  Principal Problem:   Diabetic foot infection (HCC) Active Problems:   AKI (acute kidney injury)   Gastroenteritis   MRSA bacteremia   Septic arthritis of right foot   Hypertension   Gout   GERD (gastroesophageal reflux disease)   Anxiety   Intellectual disability   Type 2 diabetes with nephropathy (HCC)   Amputated great toe of right foot   Chronic kidney disease, stage III (moderate) (HCC)   Stage 3a chronic kidney disease (HCC)   Sepsis (HCC)   Cellulitis of right foot   Cutaneous abscess of right foot   History of transmetatarsal amputation of right foot (HCC)   Osteomyelitis of right foot (HCC)   Lactic acidosis   H/O Hemorrhagic stroke   Sepsis 2/2 to Right Diabetic foot infection associated with abscess and osteomyelitis s/p Right BKA MRSA Bacteremia, Improving  -Orthopedic consulted.  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  Postop recommendations by their service. -ID team was following for Bacteremia and now signed off as Abx Recommendations have been made -Repeat blood cultures 1/5 -  NGTD -Having intermittent Temperatures and had another Temperature this AM; WBC is not elevated.  -Echocardiogram showing preserved EF 65%, grade 1 DD.  No evidence of vegetation -TEE done and showed no Vegetation -ID recommended continuing IV Daptomycin  and have now Transitioned to po Linezolid  on 02/26/24 to finish 3 more weeks with end date being 03/18/2024.  -Initial CXR done and showed Patchy left lower lobe opacity, possibly representing atelectasis and/or infection.  -PT/OT recommending SNF   Normocytic Anemia: stable   Type 2 Diabetes Mellitus with neurological complications, uncontrolled with Hyperglycemia -A1c 9.8.  Sliding scale and Accu-Chek.  Will adjust as necessary and increased Semglee  to 15 units at bedtime, Continuing Moderate Novolog  SSI AC/HS and increasing Novolog  to 5 mg TIDwm.    AKI on CKD stage 3b / Metabolic Acidosis: Baseline creatinine around 2.0, admission creatinine 4.82;   -Renal ultrasound does not show acute pathology -Avoid Nephrotoxic Medications, Contrast Dyes, Hypotension and Dehydration to Ensure Adequate Renal Perfusion and will need to Renally Adjust Meds.   Gastroenteritis:  Improved. C. difficile antigen positive, but toxigenic PCR is negative.  GI panel is neg.   Diffuse Macular Rash: Unclear etiology but ? Medication Related. Patient thought it was getting worse but after addition of IV Solumedrol 125 mg x2 it seems to be improving. Dced solumedrol on 1/14   Essential Hypertension  Sinus Tachycardia -C/w Metoprolol  Succinate 50 mg po Daily and IV Metoprolol  Tartrate 5 mg IV q4hprn for HR >110.   Hyponatremia, f/u BMP in am Hypomagnesemia, prn repletion Hypokalemia, prn repletion   Disposition: Pending  SNF   DVT prophylaxis: heparin  injection 5,000 Units Start: 02/22/24 0600 SCD's Start: 02/21/24 1629     Code Status: Full Code Family Communication:  None at the bedside Status is: Inpatient Remains inpatient appropriate because: Pending  SNF    Subjective:  No acute events overnight., Denies any active complaints. We spoke about pending insurance auth before he can go to a SNF.  Examination:  General exam: Appears calm and comfortable  Respiratory system: Clear to auscultation. Respiratory effort normal. Cardiovascular system: S1 & S2 heard, RRR. No JVD, murmurs, rubs, gallops or clicks. No pedal edema. Gastrointestinal system: Abdomen is nondistended, soft and nontender. No organomegaly or masses felt. Normal bowel sounds heard. Central nervous system: Alert and oriented. No focal neurological deficits. Extremities: s/p Right BKA Skin: No rashes, lesions or ulcers      Diet Orders (From admission, onward)     Start     Ordered   02/22/24 1315  Diet heart healthy/carb modified Room service appropriate? Yes; Fluid consistency: Thin  Diet effective now       Question Answer Comment  Diet-HS Snack? Nothing   Room service appropriate? Yes   Fluid consistency: Thin      02/22/24 1314            Objective: Vitals:   02/27/24 1534 02/27/24 1900 02/28/24 0415 02/28/24 0835  BP: (!) 157/75 (!) 157/67 (!) 157/72 (!) 155/61  Pulse: 75 78 72 75  Resp: 18 19 19 19   Temp: 98.6 F (37 C) 98.2 F (36.8 C) 97.9 F (36.6 C) 97.8 F (36.6 C)  TempSrc: Oral Oral Oral Oral  SpO2: 95% 94% 95% 97%  Weight:      Height:        Intake/Output Summary (Last 24 hours) at 02/28/2024 1004 Last data filed at 02/28/2024 0800 Gross per 24 hour  Intake 600 ml  Output 3225 ml  Net -2625 ml   Filed Weights   02/17/24 1420 02/21/24 1215  Weight: 90.2 kg 90.2 kg    Scheduled Meds:  acidophilus  1 capsule Oral Daily   ascorbic acid   1,000 mg Oral Daily   benzonatate   100 mg Oral TID   docusate sodium   100 mg Oral Daily   ferrous sulfate   325 mg Oral TID WC   heparin   5,000 Units Subcutaneous Q8H   insulin  aspart  0-15 Units Subcutaneous TID WC   insulin  aspart  0-5 Units Subcutaneous QHS   insulin  aspart  5 Units  Subcutaneous TID WC   insulin  glargine  15 Units Subcutaneous QHS   linezolid   600 mg Oral Q12H   methylPREDNISolone  (SOLU-MEDROL ) injection  60 mg Intravenous Daily   metoprolol  succinate  50 mg Oral Daily   nutrition supplement (JUVEN)  1 packet Oral BID BM   sodium chloride  flush  3 mL Intravenous Q12H   Vitamin D  (Ergocalciferol )  50,000 Units Oral QHS   zinc  sulfate (50mg  elemental zinc )  220 mg Oral Daily   Continuous Infusions:  Nutritional status     Body mass index is 25.53 kg/m.  Data Reviewed:   CBC: Recent Labs  Lab 02/23/24 0535 02/24/24 0456 02/25/24 0541 02/26/24 0526 02/27/24 0513  WBC 2.9* 3.5* 5.6 3.7* 7.9  NEUTROABS  --  2.0 4.1 3.4 7.1  HGB 7.3* 8.9* 8.4* 8.6* 9.1*  HCT 22.1* 26.7* 25.2* 25.6* 27.0*  MCV 82.2 81.7 81.6 81.0 79.9*  PLT 202 237 245 244 295   Basic Metabolic Panel: Recent Labs  Lab 02/23/24 0535 02/24/24 0456 02/25/24 0541 02/26/24 0526 02/27/24 0513  NA 137 133* 135 131* 131*  K 3.8 3.9 4.4 5.0 5.3*  CL 108 102 104 98 99  CO2 21* 22 22 22  21*  GLUCOSE 179* 131* 109* 320* 286*  BUN 39* 44* 56* 66* 73*  CREATININE 3.46* 3.29* 3.56* 3.44* 3.27*  CALCIUM  7.1* 7.4* 7.4* 8.0* 7.8*  MG 1.5* 1.9 1.9 2.0 1.9  PHOS  --  4.0 3.6 4.2 4.3   GFR: Estimated Creatinine Clearance: 23.7 mL/min (A) (by C-G formula based on SCr of 3.27 mg/dL (H)). Liver Function Tests: Recent Labs  Lab 02/24/24 0456 02/25/24 0541 02/26/24 0526 02/27/24 0513  AST 23 21 19 22   ALT 6 8 7 13   ALKPHOS 43 43 51 48  BILITOT <0.2 0.2 0.2 0.3  PROT 5.3* 5.2* 6.0* 5.7*  ALBUMIN 2.3* 2.3* 2.6* 2.6*   No results for input(s): LIPASE, AMYLASE in the last 168 hours. No results for input(s): AMMONIA in the last 168 hours. Coagulation Profile: No results for input(s): INR, PROTIME in the last 168 hours. Cardiac Enzymes: Recent Labs  Lab 02/27/24 0513  CKTOTAL 46*   BNP (last 3 results) No results for input(s): PROBNP in the last 8760  hours. HbA1C: No results for input(s): HGBA1C in the last 72 hours. CBG: Recent Labs  Lab 02/27/24 0803 02/27/24 1144 02/27/24 1532 02/27/24 2110 02/28/24 0714  GLUCAP 277* 356* 264* 345* 245*   Lipid Profile: No results for input(s): CHOL, HDL, LDLCALC, TRIG, CHOLHDL, LDLDIRECT in the last 72 hours. Thyroid  Function Tests: No results for input(s): TSH, T4TOTAL, FREET4, T3FREE, THYROIDAB in the last 72 hours. Anemia Panel: No results for input(s): VITAMINB12, FOLATE, FERRITIN, TIBC, IRON, RETICCTPCT in the last 72 hours. Sepsis Labs: No results for input(s): PROCALCITON, LATICACIDVEN in the last 168 hours.  Recent Results (from the past 240 hours)  C Difficile Quick Screen w PCR reflex     Status: Abnormal   Collection Time: 02/18/24  6:35 PM   Specimen: Stool  Result Value Ref Range Status   C Diff antigen POSITIVE (A) NEGATIVE Final   C Diff toxin NEGATIVE NEGATIVE Final   C Diff interpretation Results are indeterminate. See PCR results.  Final    Comment: Performed at [facility], [address], [place], [place] [address]  C. Diff by PCR, Reflexed     Status: None   Collection Time: 02/18/24  6:35 PM  Result Value Ref Range Status   Toxigenic C. Difficile by PCR NEGATIVE NEGATIVE Final    Comment: Patient is colonized with non toxigenic C. difficile. May not need treatment unless significant symptoms are present.   Hypervirulent Strain PRESUMPTIVE NEGATIVE PRESUMPTIVE NEGATIVE Final    Comment: Performed at [facility], [address] [place], [place] [address]  Gastrointestinal Panel by PCR , Stool     Status: None   Collection Time: 02/18/24  8:02 PM  Result Value Ref Range Status   Campylobacter species NOT DETECTED NOT DETECTED Final   Plesimonas shigelloides NOT DETECTED NOT DETECTED Final   Salmonella species NOT DETECTED NOT DETECTED Final   Yersinia enterocolitica NOT DETECTED NOT DETECTED Final   Vibrio species  NOT DETECTED NOT DETECTED Final   Vibrio cholerae NOT DETECTED NOT DETECTED Final   Enteroaggregative E coli (EAEC) NOT DETECTED NOT DETECTED Final   Enteropathogenic E coli (EPEC) NOT DETECTED NOT DETECTED Final   Enterotoxigenic E coli (ETEC) NOT DETECTED NOT DETECTED Final  Shiga like toxin producing E coli (STEC) NOT DETECTED NOT DETECTED Final   Shigella/Enteroinvasive E coli (EIEC) NOT DETECTED NOT DETECTED Final   Cryptosporidium NOT DETECTED NOT DETECTED Final   Cyclospora cayetanensis NOT DETECTED NOT DETECTED Final   Entamoeba histolytica NOT DETECTED NOT DETECTED Final   Giardia lamblia NOT DETECTED NOT DETECTED Final   Adenovirus F40/41 NOT DETECTED NOT DETECTED Final   Astrovirus NOT DETECTED NOT DETECTED Final   Norovirus GI/GII NOT DETECTED NOT DETECTED Final   Rotavirus A NOT DETECTED NOT DETECTED Final   Sapovirus (I, II, IV, and V) NOT DETECTED NOT DETECTED Final    Comment: Performed at [facility], [address] [place], [place] [address]  Respiratory (~20 pathogens) panel by PCR     Status: None   Collection Time: 02/19/24 12:58 AM   Specimen: Nasopharyngeal Swab; Respiratory  Result Value Ref Range Status   Adenovirus NOT DETECTED NOT DETECTED Final   Coronavirus 229E NOT DETECTED NOT DETECTED Final    Comment: (NOTE) The Coronavirus on the Respiratory Panel, DOES NOT test for the novel  Coronavirus (2019 nCoV)    Coronavirus HKU1 NOT DETECTED NOT DETECTED Final   Coronavirus NL63 NOT DETECTED NOT DETECTED Final   Coronavirus OC43 NOT DETECTED NOT DETECTED Final   Metapneumovirus NOT DETECTED NOT DETECTED Final   Rhinovirus / Enterovirus NOT DETECTED NOT DETECTED Final   Influenza A NOT DETECTED NOT DETECTED Final   Influenza B NOT DETECTED NOT DETECTED Final   Parainfluenza Virus 1 NOT DETECTED NOT DETECTED Final   Parainfluenza Virus 2 NOT DETECTED NOT DETECTED Final   Parainfluenza Virus 3 NOT DETECTED NOT DETECTED Final   Parainfluenza  Virus 4 NOT DETECTED NOT DETECTED Final   Respiratory Syncytial Virus NOT DETECTED NOT DETECTED Final   Bordetella pertussis NOT DETECTED NOT DETECTED Final   Bordetella Parapertussis NOT DETECTED NOT DETECTED Final   Chlamydophila pneumoniae NOT DETECTED NOT DETECTED Final   Mycoplasma pneumoniae NOT DETECTED NOT DETECTED Final    Comment: Performed at [facility], [address], [place], [place] [address]  Culture, blood (Routine X 2) w Reflex to ID Panel     Status: None   Collection Time: 02/19/24  2:39 AM   Specimen: BLOOD RIGHT HAND  Result Value Ref Range Status   Specimen Description BLOOD RIGHT HAND  Final   Special Requests   Final    BOTTLES DRAWN AEROBIC AND ANAEROBIC Blood Culture adequate volume IMMUNOCOMPROMISED   Culture   Final    NO GROWTH 5 DAYS Performed at [facility], [address] [place], [place] [address]    Report Status 02/24/2024 FINAL  Final  Culture, blood (Routine X 2) w Reflex to ID Panel     Status: None   Collection Time: 02/19/24  2:39 AM   Specimen: BLOOD RIGHT ARM  Result Value Ref Range Status   Specimen Description BLOOD RIGHT ARM  Final   Special Requests   Final    Immunocompromised BOTTLES DRAWN AEROBIC AND ANAEROBIC Blood Culture adequate volume   Culture   Final    NO GROWTH 5 DAYS Performed at [facility], [address], [place], [place] [address]    Report Status 02/24/2024 FINAL  Final  Urine Culture     Status: None   Collection Time: 02/19/24  6:32 AM   Specimen: Urine, Random  Result Value Ref Range Status   Specimen Description URINE, RANDOM  Final  Special Requests URINE, CLEAN CATCH  Final   Culture   Final    NO GROWTH Performed at [facility], [address], [place], [place] [address]    Report Status 02/20/2024 FINAL  Final  Surgical pcr screen     Status: Abnormal   Collection Time: 02/20/24  2:18 PM   Specimen: Nasal Mucosa; Nasal Swab  Result Value Ref Range Status   MRSA, PCR  POSITIVE (A) NEGATIVE Final    Comment: RESULT CALLED TO, READ BACK BY AND VERIFIED WITH: RN CANDIE SHUCK [phone] @ [phone] FH    Staphylococcus aureus POSITIVE (A) NEGATIVE Final    Comment: (NOTE) The Xpert SA Assay (FDA approved for NASAL specimens in patients 33 years of age and older), is one component of a comprehensive surveillance program. It is not intended to diagnose infection nor to guide or monitor treatment. Performed at [facility], [address] [place], [place] [address]          Radiology Studies: DG CHEST PORT 1 VIEW Result Date: 02/27/2024 CLINICAL DATA:  Shortness of breath. EXAM: PORTABLE CHEST 1 VIEW COMPARISON:  Chest radiograph dated 02/26/2024. FINDINGS: No focal consolidation, pleural effusion or pneumothorax. The cardiac silhouette is within normal limits. Atherosclerotic calcification of the aorta. No acute osseous pathology. IMPRESSION: No active disease. Electronically Signed   By: Vanetta Chou M.D.   On: 02/27/2024 15:22           LOS: 11 days   Time spent= 35 mins    Deliliah Room, MD [facility]  If 7PM-7AM, please contact night-coverage  02/28/2024, 10:04 AM  "

## 2024-02-28 NOTE — TOC Transition Note (Signed)
 Transition of Care [facility]) - Discharge Note   Patient Details  Name: [name] MRN: [id number] Date of Birth: April 29, 1950  [facility]) CM/SW Contact:  Luann SHAUNNA Cumming, LCSW Phone Number: 02/28/2024, 10:07 AM   Clinical Narrative:     SNF auth approved 1/13-1/15. Auth#  [id number]  Per MD patient ready for DC to [facility]. RN, patient,  and facility notified of DC. Discharge Summary and FL2 sent to facility. RN to call report prior to discharge [phone]). DC packet on chart. Ambulance transport requested for patient.   CSW will sign off for now as social work intervention is no longer needed. Please consult us  again if new needs arise.    Final next level of care: Skilled Nursing Facility Barriers to Discharge: No Barriers Identified   Patient Goals and CMS Choice Patient states their goals for this hospitalization and ongoing recovery are:: Strengthening and return home ASAP          Discharge Placement              Patient chooses bed at:  [facility]) Patient to be transferred to facility by: [facility]   Patient and family notified of of transfer: 02/28/24  Discharge Plan and Services Additional resources added to the After Visit Summary for   In-house Referral: Clinical Social Work Discharge Planning Services: CM Consult                                 Social Drivers of Health (SDOH) Interventions SDOH Screenings   Food Insecurity: No Food Insecurity (02/17/2024)  Housing: Low Risk (02/17/2024)  Transportation Needs: No Transportation Needs (02/17/2024)  Utilities: Not At Risk (02/17/2024)  Financial Resource Strain: Low Risk (12/13/2021)  Social Connections: Socially Isolated (02/17/2024)  Tobacco Use: Low Risk (02/21/2024)     Readmission Risk Interventions    11/30/2021    2:51 PM  Readmission Risk Prevention Plan  Transportation Screening Complete  HRI or Home Care Consult Complete  Social Work Consult for Recovery Care  Planning/Counseling Complete  Palliative Care Screening Not Applicable  Medication Review Oceanographer) Complete

## 2024-02-28 NOTE — Progress Notes (Signed)
" °   02/28/24 1421  Legal Guardian  Does Patient Have a Court Appointed Legal Guardian? No  Legal Guardian Other: (cousin)  Legal Guardian Contact Information Ronal Brought [phone]  Copy of Legal Guardianship Form in Chart No - copy requested  Legal Guardian Notified of Arrival  Successfully notified  Legal Guardian Notified of Pending Discharge  Attempted notification unsuccessful    Patient does not have court appointed legal guardian. Ronal is the patient's emergency contact and RN was unsuccessful in notifying for discharge.  "

## 2024-02-28 NOTE — Discharge Summary (Signed)
 " Physician Discharge Summary   Patient: [name] MRN: [id number] DOB: 06/13/1950  Admit date:     02/17/2024  Discharge date: 02/28/2024  Discharge Physician: Deliliah Room   PCP: Marvine Rush, MD   Recommendations at discharge:    F/u with your PCP in one week  F/u with Dr Harden in one week  Discharge Diagnoses: Principal Problem:   Diabetic foot infection (HCC) Active Problems:   AKI (acute kidney injury)   Gastroenteritis   MRSA bacteremia   Septic arthritis of right foot   Hypertension   Gout   GERD (gastroesophageal reflux disease)   Anxiety   Intellectual disability   Type 2 diabetes with nephropathy (HCC)   Amputated great toe of right foot   Chronic kidney disease, stage III (moderate) (HCC)   Stage 3a chronic kidney disease (HCC)   Sepsis (HCC)   Cellulitis of right foot   Cutaneous abscess of right foot   History of transmetatarsal amputation of right foot (HCC)   Osteomyelitis of right foot (HCC)   Lactic acidosis   H/O Hemorrhagic stroke   Hospital Course: 74 year old with history of CVA with residual slurred speech, CKD 3 AA, chronic osteomyelitis s/p right TMA, HTN, DM 2 admitted to the hospital for generally feeling sick and having nausea/vomiting.  Workup showed concerns of sepsis with worsening right diabetic foot infection with abscess.  Dr. Harden was consulted and patient was transferred from [facility] to [facility] for surgical intervention.  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  Hospital course complicated by gram-positive bacteremia, echocardiogram unremarkable.    Underwent TEE and showed no Vegetation. ID recommending transitioning to po Abx on 1/12 for 3 more weeks.  Sepsis 2/2 to Right Diabetic foot infection associated with abscess and osteomyelitis s/p Right BKA MRSA Bacteremia, on linezolid  -Orthopedic consulted.  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  Postop recommendations by their service. F/u with Dr Harden in one week -ID team  consulted -Repeat blood cultures 1/5 - NGTD -Echocardiogram showing preserved EF 65%, grade 1 DD.  No evidence of vegetation -TEE done and showed no Vegetation -ID recommended continuing IV Daptomycin  and have now Transitioned to po Linezolid  on 02/26/24 to finish 3 more weeks with end date being 03/20/2024.  -PT/OT recommending SNF   Normocytic Anemia: stable   Type 2 Diabetes Mellitus with neurological complications, uncontrolled with Hyperglycemia -A1c 9.8.  Sliding scale and Accu-Chek.  Will adjust as necessary and increased Semglee  to 15 units at bedtime, Continuing Moderate Novolog  SSI AC/HS and increasing Novolog  to 5 mg TIDwm.    AKI on CKD stage 3b / Metabolic Acidosis: Baseline creatinine around 2.0, admission creatinine 4.82;  F/u BMP in one week Outpt f/u with Dr Rachele in 2 weeks.   -Renal ultrasound does not show acute pathology -Avoid Nephrotoxic Medications, Contrast Dyes, Hypotension and Dehydration to Ensure Adequate Renal Perfusion and will need to Renally Adjust Meds.   Diffuse Macular Rash: Unclear etiology but ? Medication Related. Patient thought it was getting worse but after addition of IV Solumedrol 125 mg x2 it seems to be improving. Dced solumedrol on 1/14   Essential Hypertension  Sinus Tachycardia -C/w Metoprolol  Succinate 50 mg po Daily and PO Hydralazine    Hyponatremia, f/u BMP in one week Hypomagnesemia, prn repletion Hypokalemia, prn repletion    Disposition: [facility]      Consultants: ID,  Ortho, Cardiology Procedures performed: Right BKA, TEE Disposition: Skilled nursing facility Diet recommendation:  Cardiac and Carb modified diet  DISCHARGE MEDICATION: Allergies as of 02/28/2024       Reactions   Ivp Dye [iodinated Contrast Media] Other (See Comments)   Speed heart rate up    Penicillins Rash   Pt tolerated amoxicillin  oral challenge   Tetanus Toxoid-containing Vaccines Rash        Medication List     STOP taking these  medications    Accu-Chek Guide test strip Generic drug: glucose blood   glimepiride 4 MG tablet Commonly known as: AMARYL   insulin  glargine-yfgn 100 UNIT/ML injection Commonly known as: SEMGLEE  Replaced by: insulin  glargine 100 UNIT/ML injection   NovoLOG  FlexPen 100 UNIT/ML FlexPen Generic drug: insulin  aspart Replaced by: insulin  aspart 100 UNIT/ML injection You also have another medication with the same name that you need to continue taking as instructed.   pioglitazone 30 MG tablet Commonly known as: ACTOS   senna-docusate 8.6-50 MG tablet Commonly known as: Senokot-S   Toujeo  Max SoloStar 300 UNIT/ML Solostar Pen Generic drug: insulin  glargine (2 Unit Dial) Replaced by: insulin  glargine 100 UNIT/ML injection       TAKE these medications    acetaminophen  500 MG tablet Commonly known as: TYLENOL  Take 2 tablets (1,000 mg total) by mouth every 8 (eight) hours as needed for mild pain (pain score 1-3) or moderate pain (pain score 4-6). Pain   acidophilus Caps capsule Take 1 capsule by mouth daily. Start taking on: February 29, 2024   ascorbic acid  1000 MG tablet Commonly known as: VITAMIN C  Take 1 tablet (1,000 mg total) by mouth daily. Start taking on: February 29, 2024   atorvastatin  10 MG tablet Commonly known as: LIPITOR Take 10 mg by mouth at bedtime.   B-complex with vitamin C  tablet Take 1 tablet by mouth daily.   buPROPion  150 MG 24 hr tablet Commonly known as: WELLBUTRIN  XL Take 150 mg by mouth at bedtime.   cetirizine 10 MG tablet Commonly known as: ZYRTEC Take 10 mg by mouth daily as needed for allergies.   feeding supplement Liqd Take 237 mLs by mouth 2 (two) times daily between meals.   ferrous sulfate  325 (65 FE) MG tablet Take 1 tablet (325 mg total) by mouth 2 (two) times daily with a meal.   hydrALAZINE  25 MG tablet Commonly known as: APRESOLINE  TAKE 1 TABLET BY MOUTH IN THE MORNING AND AT BEDTIME. MAY TAKE ADDITIONAL AS NEEDED FOR  SYSTOLIC BLOOD PRESSURE CONSISTENTLY ABOVE 140.   insulin  aspart 100 UNIT/ML injection Commonly known as: novoLOG  Inject 5 Units into the skin 3 (three) times daily with meals. What changed:  how much to take how to take this when to take this additional instructions Another medication with the same name was removed. Continue taking this medication, and follow the directions you see here.   insulin  aspart 100 UNIT/ML injection Commonly known as: novoLOG  Inject 0-15 Units into the skin 3 (three) times daily with meals. What changed: You were already taking a medication with the same name, and this prescription was added. Make sure you understand how and when to take each. Replaces: NovoLOG  FlexPen 100 UNIT/ML FlexPen   insulin  aspart 100 UNIT/ML injection Commonly known as: novoLOG  Inject 0-5 Units into the skin at bedtime. What changed: You were already taking a medication with the same name, and this prescription was added. Make sure you understand how and when to take each.   insulin  glargine 100 UNIT/ML injection Commonly known as: LANTUS  Inject 0.15 mLs (15 Units total) into the skin at bedtime.  Replaces: Toujeo  Max SoloStar 300 UNIT/ML Solostar Pen   linezolid  600 MG tablet Commonly known as: ZYVOX  Take 1 tablet (600 mg total) by mouth every 12 (twelve) hours for 21 days.   metoprolol  succinate 50 MG 24 hr tablet Commonly known as: TOPROL -XL Take 1 tablet (50 mg total) by mouth daily. Take with or immediately following a meal. Start taking on: February 29, 2024 What changed:  medication strength how much to take   omeprazole  20 MG capsule Commonly known as: PRILOSEC TAKE ONE CAPSULE BY MOUTH DAILY. NEEDS OFFICE VISIT FOR FURTHER REFILLS.   ondansetron  4 MG tablet Commonly known as: ZOFRAN  Take 1 tablet (4 mg total) by mouth every 6 (six) hours as needed for nausea. What changed: Another medication with the same name was added. Make sure you understand how and when  to take each.   ondansetron  4 MG/2ML Soln injection Commonly known as: ZOFRAN  Inject 2 mLs (4 mg total) into the vein every 6 (six) hours as needed for nausea or vomiting. What changed: You were already taking a medication with the same name, and this prescription was added. Make sure you understand how and when to take each.   oxyCODONE -acetaminophen  10-325 MG tablet Commonly known as: Percocet Take 1 tablet by mouth every 6 (six) hours as needed for pain (severe pain).   polyethylene glycol 17 g packet Commonly known as: MIRALAX  / GLYCOLAX  Take 17 g by mouth daily as needed for mild constipation.   sertraline  100 MG tablet Commonly known as: ZOLOFT  Take 100 mg by mouth at bedtime.   sodium bicarbonate  650 MG tablet Take 650 mg by mouth 2 (two) times daily.   Uloric  80 MG Tabs Generic drug: Febuxostat  Take 80 mg by mouth daily.   Vitamin D  50 MCG (2000 UT) Caps Take 2,000 Units by mouth daily.   zinc  sulfate (50mg  elemental zinc ) 220 (50 Zn) MG capsule Take 1 capsule (220 mg total) by mouth daily for 10 days. Start taking on: February 29, 2024        Contact information for follow-up providers     Harden Jerona GAILS, MD Follow up in 1 week(s).   Specialty: Orthopedic Surgery Contact information: [address] [place]  [place] [address] [phone]         Marvine Rush, MD. Schedule an appointment as soon as possible for a visit in 1 week(s).   Specialty: Family Medicine Contact information: [address] [place] [address] [phone]              Contact information for after-discharge care     Destination     [facility] .   Service: Skilled Nursing Contact information: [address] [facility] [place]  [address] [phone]                    Discharge Exam: [name]   02/17/24 1420 02/21/24 1215  Weight: 90.2 kg 90.2 kg   General exam: Appears calm and comfortable  Respiratory system: Clear to  auscultation. Respiratory effort normal. Cardiovascular system: S1 & S2 heard, RRR. No JVD, murmurs, rubs, gallops or clicks. No pedal edema. Gastrointestinal system: Abdomen is nondistended, soft and nontender. No organomegaly or masses felt. Normal bowel sounds heard. Central nervous system: Alert and oriented. No focal neurological deficits. Extremities: s/p Right BKA Skin: No rashes, lesions or ulcers  Condition at discharge: good  The results of significant diagnostics from this hospitalization (including imaging, microbiology, ancillary and laboratory) are listed below for reference.  Imaging Studies: DG CHEST PORT 1 VIEW Result Date: 02/27/2024 CLINICAL DATA:  Shortness of breath. EXAM: PORTABLE CHEST 1 VIEW COMPARISON:  Chest radiograph dated 02/26/2024. FINDINGS: No focal consolidation, pleural effusion or pneumothorax. The cardiac silhouette is within normal limits. Atherosclerotic calcification of the aorta. No acute osseous pathology. IMPRESSION: No active disease. Electronically Signed   By: Vanetta Chou M.D.   On: 02/27/2024 15:22   DG CHEST PORT 1 VIEW Result Date: 02/26/2024 CLINICAL DATA:  Shortness of breath EXAM: PORTABLE CHEST 1 VIEW COMPARISON:  Chest CT dated 02/23/2024. FINDINGS: There is mild cardiomegaly with mild vascular congestion. Faint bilateral streaky densities concerning for developing infiltrate. Trace bilateral pleural effusions may be present. No pneumothorax. No acute osseous pathology. IMPRESSION: 1. Mild cardiomegaly with mild vascular congestion. 2. Faint bilateral streaky densities concerning for developing infiltrate. Electronically Signed   By: Vanetta Chou M.D.   On: 02/26/2024 18:31   DG CHEST PORT 1 VIEW Result Date: 02/23/2024 EXAM: 1 VIEW(S) XRAY OF THE CHEST 02/23/2024 09:27:00 AM COMPARISON: 02/17/2024 CLINICAL HISTORY: Cough; Fever FINDINGS: LUNGS AND PLEURA: New left lower lung patchy opacities. No pleural effusion. No pneumothorax. HEART  AND MEDIASTINUM: No acute abnormality of the cardiac and mediastinal silhouettes. BONES AND SOFT TISSUES: No acute osseous abnormality. IMPRESSION: 1. Patchy left lower lobe opacity, possibly representing atelectasis and/or infection. Electronically signed by: Michaeline Blanch MD 02/23/2024 04:23 PM EST RP Workstation: [id number]   ECHO TEE Result Date: 02/22/2024    TRANSESOPHOGEAL ECHO REPORT   Patient Name:   JEBEDIAH MACRAE Date of Exam: 02/22/2024 Medical Rec #:  [id number]        Height:       74.0 in Accession #:    [id number]       Weight:       198.9 lb Date of Birth:  [birth year]        BSA:          2.168 m Patient Age:    73 years         BP:           148/62 mmHg Patient Gender: M                HR:           90 bpm. Exam Location:  Inpatient Procedure: 3D Echo, Transesophageal Echo, Cardiac Doppler and Color Doppler            (Both Spectral and Color Flow Doppler were utilized during            procedure). Indications:    Bacteremia  History:        Patient has prior history of Echocardiogram examinations, most                 recent 02/19/2024.  Sonographer:    Tinnie Gosling RDCS Referring Phys: [id number] ZANE ADAMS PROCEDURE: After discussion of the risks and benefits of a TEE, an informed consent was obtained from the patient. The transesophogeal probe was passed without difficulty through the esophogus of the patient. Sedation performed by different physician. The patient developed no complications during the procedure.  IMPRESSIONS  1. Left ventricular ejection fraction, by estimation, is 60 to 65%. The left ventricle has normal function.  2. Right ventricular systolic function is normal. The right ventricular size is normal.  3. No left atrial/left atrial appendage thrombus was detected.  4. The mitral valve is normal in structure. Trivial mitral valve regurgitation.  5. The aortic  valve is tricuspid. Aortic valve regurgitation is trivial. Aortic valve sclerosis/calcification is present, without any  evidence of aortic stenosis.  6. 3D performed of the mitral valve and 3D performed of the aortic valve and demonstrates no vegetation. Conclusion(s)/Recommendation(s): No evidence of vegetation/infective endocarditis on this transesophageael echocardiogram. FINDINGS  Left Ventricle: Left ventricular ejection fraction, by estimation, is 60 to 65%. The left ventricle has normal function. The left ventricular internal cavity size was normal in size. Right Ventricle: The right ventricular size is normal. No increase in right ventricular wall thickness. Right ventricular systolic function is normal. Left Atrium: Left atrial size was normal in size. No left atrial/left atrial appendage thrombus was detected. Right Atrium: Right atrial size was normal in size. Pericardium: There is no evidence of pericardial effusion. Mitral Valve: The mitral valve is normal in structure. Trivial mitral valve regurgitation. Tricuspid Valve: The tricuspid valve is normal in structure. Tricuspid valve regurgitation is trivial. Aortic Valve: The aortic valve is tricuspid. Aortic valve regurgitation is trivial. Aortic valve sclerosis/calcification is present, without any evidence of aortic stenosis. Pulmonic Valve: The pulmonic valve was grossly normal. Pulmonic valve regurgitation is mild. Aorta: The aortic root and ascending aorta are structurally normal, with no evidence of dilitation. IAS/Shunts: No atrial level shunt detected by color flow Doppler. Additional Comments: 3D was performed not requiring image post processing on an independent workstation and was normal. Lonni Nanas MD Electronically signed by Lonni Nanas MD Signature Date/Time: 02/22/2024/3:07:38 PM    Final    EP STUDY Result Date: 02/22/2024 See surgical note for result.  US  RENAL Result Date: 02/19/2024 SOPHIA: US  Retroperitoneum Complete, Renal. 02/19/2024 08:49:00 PM TECHNIQUE: Real-time ultrasonography of the retroperitoneum renal was performed.  COMPARISON: CT abdomen and pelvis 06/03/2023 and US  Renal 04/11/2019. CLINICAL HISTORY: AKI (acute kidney injury). FINDINGS: FINDINGS: RIGHT KIDNEY/URETER: Right kidney measures 11.7 x 5.6 x 5.7 cm. Normal cortical echogenicity. No hydronephrosis. No calculus. No mass. There is a cyst in the inferior pole of the right kidney measuring 7 x 7 x 8 mm. The right ureteral jet is seen. LEFT KIDNEY/URETER: Left kidney measures 11.3 x 5.0 x 4.4 cm. Normal cortical echogenicity. No hydronephrosis. No calculus. No mass. There is a cyst in the superior pole of the left kidney measuring 2.5 x 2.4 x 2.2 cm. There is a simple cyst in the inferior pole of the left kidney measuring 2.7 x 2.4 x 2.5 cm. The left ureteral jet is not seen. BLADDER: Debris is visualized layering in the bladder. Only the right ureteral jet is seen. OTHER FINDINGS: Incidental finding of ascites in the perisplenic region and superior to the urinary bladder. IMPRESSION: 1. No acute renal abnormality identified. 2. Bilateral renal cysts. 3. Bladder debris. 4. Incidental small-volume ascites. Electronically signed by: Greig Pique MD 02/19/2024 10:25 PM EST RP Workstation: [id number]   ECHOCARDIOGRAM COMPLETE Result Date: 02/19/2024    ECHOCARDIOGRAM REPORT   Patient Name:   EDWARD GUTHMILLER Date of Exam: 02/19/2024 Medical Rec #:  [id number]        Height:       74.0 in Accession #:    [id number]       Weight:       198.9 lb Date of Birth:  Jan 15, 1951        BSA:          2.168 m Patient Age:    73 years         BP:  142/68 mmHg Patient Gender: M                HR:           85 bpm. Exam Location:  Inpatient Procedure: 2D Echo, Cardiac Doppler and Color Doppler (Both Spectral and Color            Flow Doppler were utilized during procedure). Indications:    Bacteremia  History:        Patient has prior history of Echocardiogram examinations, most                 recent 05/04/2023. Stroke; Risk Factors:Hypertension, Diabetes                 and  Dyslipidemia.  Sonographer:    Meagan Baucom RDCS, FE, PE Referring Phys: [id number] [place] L JOHNSON IMPRESSIONS  1. Left ventricular ejection fraction, by estimation, is 60 to 65%. The left ventricle has normal function. The left ventricle has no regional wall motion abnormalities. Left ventricular diastolic parameters are consistent with Grade I diastolic dysfunction (impaired relaxation).  2. Right ventricular systolic function is normal. The right ventricular size is normal.  3. The mitral valve is normal in structure. No evidence of mitral valve regurgitation. No evidence of mitral stenosis.  4. The aortic valve is tricuspid. There is mild calcification of the aortic valve. Aortic valve regurgitation is not visualized. No aortic stenosis is present.  5. The inferior vena cava is normal in size with greater than 50% respiratory variability, suggesting right atrial pressure of 3 mmHg. Conclusion(s)/Recommendation(s): No evidence of valvular vegetations on this transthoracic echocardiogram. Consider a transesophageal echocardiogram to exclude infective endocarditis if clinically indicated. FINDINGS  Left Ventricle: Left ventricular ejection fraction, by estimation, is 60 to 65%. The left ventricle has normal function. The left ventricle has no regional wall motion abnormalities. The left ventricular internal cavity size was normal in size. There is  no left ventricular hypertrophy. Left ventricular diastolic parameters are consistent with Grade I diastolic dysfunction (impaired relaxation). Right Ventricle: The right ventricular size is normal. No increase in right ventricular wall thickness. Right ventricular systolic function is normal. Left Atrium: Left atrial size was normal in size. Right Atrium: Right atrial size was normal in size. Pericardium: There is no evidence of pericardial effusion. Mitral Valve: The mitral valve is normal in structure. No evidence of mitral valve regurgitation. No evidence of mitral  valve stenosis. Tricuspid Valve: The tricuspid valve is normal in structure. Tricuspid valve regurgitation is not demonstrated. No evidence of tricuspid stenosis. Aortic Valve: The aortic valve is tricuspid. There is mild calcification of the aortic valve. Aortic valve regurgitation is not visualized. No aortic stenosis is present. Pulmonic Valve: The pulmonic valve was normal in structure. Pulmonic valve regurgitation is not visualized. No evidence of pulmonic stenosis. Aorta: The aortic root is normal in size and structure. Venous: The inferior vena cava is normal in size with greater than 50% respiratory variability, suggesting right atrial pressure of 3 mmHg. IAS/Shunts: No atrial level shunt detected by color flow Doppler.  LEFT VENTRICLE PLAX 2D LVIDd:         4.70 cm   Diastology LVIDs:         3.10 cm   LV e' medial:    7.83 cm/s LV PW:         1.00 cm   LV E/e' medial:  8.6 LV IVS:        1.10 cm   LV e'  lateral:   8.16 cm/s LVOT diam:     2.40 cm   LV E/e' lateral: 8.2 LV SV:         79 LV SV Index:   37 LVOT Area:     4.52 cm  RIGHT VENTRICLE RV Basal diam:  3.10 cm RV Mid diam:    2.40 cm RV S prime:     12.00 cm/s TAPSE (M-mode): 2.2 cm LEFT ATRIUM             Index        RIGHT ATRIUM           Index LA diam:        3.40 cm 1.57 cm/m   RA Area:     13.10 cm LA Vol (A2C):   51.0 ml 23.52 ml/m  RA Volume:   28.50 ml  13.14 ml/m LA Vol (A4C):   45.8 ml 21.12 ml/m LA Biplane Vol: 52.6 ml 24.26 ml/m  AORTIC VALVE LVOT Vmax:   107.00 cm/s LVOT Vmean:  69.500 cm/s LVOT VTI:    0.175 m  AORTA Ao Root diam: 3.40 cm Ao Asc diam:  3.40 cm MITRAL VALVE               TRICUSPID VALVE MV Area (PHT): 4.39 cm    TR Peak grad:   14.1 mmHg MV Decel Time: 173 msec    TR Vmax:        188.00 cm/s MV E velocity: 67.30 cm/s MV A velocity: 95.20 cm/s  SHUNTS MV E/A ratio:  0.71        Systemic VTI:  0.18 m                            Systemic Diam: 2.40 cm Oneil Parchment MD Electronically signed by Oneil Parchment MD Signature  Date/Time: 02/19/2024/3:08:31 PM    Final    MR FOOT RIGHT WO CONTRAST Result Date: 02/17/2024 CLINICAL DATA:  Open wound involving the medial aspect of the hindfoot. EXAM: MRI OF THE RIGHT FOREFOOT WITHOUT CONTRAST TECHNIQUE: Multiplanar, multisequence MR imaging of the right foot was performed. No intravenous contrast was administered. COMPARISON:  Radiographs, same date. FINDINGS: Prior mid distal amputations of the second through fifth toes. Prior amputation of the first ray. Abnormal T1 and T2 signal intensity in the medial cuneiform consistent with osteomyelitis. There is also an adjacent complex fluid collection measuring 3.2 x 2.4 x 2.1 cm and consistent with an abscess. I do not see any definite findings for osteomyelitis involving the other cuneiform is or metatarsals. Open wound noted along the medial aspect of the hindfoot with an underlying large complex abscess involving the subcutaneous tissues and also the underlying musculature. This measures approximately 5.1 x 3.9 x 2.9 cm. Adjacent diffuse osteomyelitis involving the calcaneus. Diffuse cellulitis and myofasciitis. Small tibiotalar joint effusion but no findings suspicious for septic arthritis. The subtalar joints are maintained. Severe chronic Achilles tendinopathy. The plantar fascia is surrounded by abscess and is torn IMPRESSION: 1. Open wound along the medial aspect of the hindfoot with an underlying large complex abscess involving the subcutaneous tissues and also the underlying musculature. 2. Osteomyelitis involving the medial cuneiform and the calcaneus. 3. Diffuse cellulitis and myofasciitis. 4. Small tibiotalar joint effusion but no findings suspicious for septic arthritis. 5. Severe chronic Achilles tendinopathy. Electronically Signed   By: MYRTIS Stammer M.D.   On: 02/17/2024 19:08   DG Foot Complete Right  Result Date: 02/17/2024 CLINICAL DATA:  Purulent discharge from surgical wound. EXAM: RIGHT FOOT COMPLETE - 3+ VIEW COMPARISON:   01/16/2024. FINDINGS: First ray amputation with additional amputations of the second through fifth rays at the metatarsal necks. Soft tissue wound overlies the distal second metatarsal. No osseous erosion to suggest osteomyelitis. No radiopaque foreign body. Prominent calcaneal spurs. IMPRESSION: 1. Soft tissue wound over the distal second metatarsal. No underlying cortical erosion to suggest osteomyelitis. 2. First through fifth ray amputations, as detailed above. Electronically Signed   By: Newell Eke M.D.   On: 02/17/2024 16:06   DG Chest Port 1 View Result Date: 02/17/2024 CLINICAL DATA:  Weakness, purulent discharge from surgical wound on foot. EXAM: PORTABLE CHEST 1 VIEW COMPARISON:  01/16/2024. FINDINGS: Trachea is midline. Heart size normal. Thoracic aorta is calcified. Minimal streaky atelectasis or scarring in the left lung base. No pleural fluid. IMPRESSION: Minimal streaky atelectasis or scarring in the left lung base. Electronically Signed   By: Newell Eke M.D.   On: 02/17/2024 16:04    Microbiology: Results for orders placed or performed during the hospital encounter of 02/17/24  Resp panel by RT-PCR (RSV, Flu A&B, Covid) Anterior Nasal Swab     Status: None   Collection Time: 02/17/24  2:21 PM   Specimen: Anterior Nasal Swab  Result Value Ref Range Status   SARS Coronavirus 2 by RT PCR NEGATIVE NEGATIVE Final    Comment: (NOTE) SARS-CoV-2 target nucleic acids are NOT DETECTED.  The SARS-CoV-2 RNA is generally detectable in upper respiratory specimens during the acute phase of infection. The lowest concentration of SARS-CoV-2 viral copies this assay can detect is 138 copies/mL. A negative result does not preclude SARS-Cov-2 infection and should not be used as the sole basis for treatment or other patient management decisions. A negative result may occur with  improper specimen collection/handling, submission of specimen other than nasopharyngeal swab, presence of viral  mutation(s) within the areas targeted by this assay, and inadequate number of viral copies(<138 copies/mL). A negative result must be combined with clinical observations, patient history, and epidemiological information. The expected result is Negative.  Fact Sheet for Patients:  bloggercourse.com  Fact Sheet for Healthcare Providers:  seriousbroker.it  This test is no t yet approved or cleared by the [place]  FDA and  has been authorized for detection and/or diagnosis of SARS-CoV-2 by FDA under an Emergency Use Authorization (EUA). This EUA will remain  in effect (meaning this test can be used) for the duration of the COVID-19 declaration under Section 564(b)(1) of the Act, 21 U.S.C.section 360bbb-3(b)(1), unless the authorization is terminated  or revoked sooner.       Influenza A by PCR NEGATIVE NEGATIVE Final   Influenza B by PCR NEGATIVE NEGATIVE Final    Comment: (NOTE) The Xpert Xpress SARS-CoV-2/FLU/RSV plus assay is intended as an aid in the diagnosis of influenza from Nasopharyngeal swab specimens and should not be used as a sole basis for treatment. Nasal washings and aspirates are unacceptable for Xpert Xpress SARS-CoV-2/FLU/RSV testing.  Fact Sheet for Patients: bloggercourse.com  Fact Sheet for Healthcare Providers: seriousbroker.it  This test is not yet approved or cleared by the [place]  FDA and has been authorized for detection and/or diagnosis of SARS-CoV-2 by FDA under an Emergency Use Authorization (EUA). This EUA will remain in effect (meaning this test can be used) for the duration of the COVID-19 declaration under Section 564(b)(1) of the Act, 21 U.S.C. section 360bbb-3(b)(1), unless the authorization is terminated  or revoked.     Resp Syncytial Virus by PCR NEGATIVE NEGATIVE Final    Comment: (NOTE) Fact Sheet for  Patients: bloggercourse.com  Fact Sheet for Healthcare Providers: seriousbroker.it  This test is not yet approved or cleared by the [place]  FDA and has been authorized for detection and/or diagnosis of SARS-CoV-2 by FDA under an Emergency Use Authorization (EUA). This EUA will remain in effect (meaning this test can be used) for the duration of the COVID-19 declaration under Section 564(b)(1) of the Act, 21 U.S.C. section 360bbb-3(b)(1), unless the authorization is terminated or revoked.  Performed at [facility], [address], [place], [place] [address]   Blood Culture (routine x 2)     Status: Abnormal   Collection Time: 02/17/24  2:48 PM   Specimen: BLOOD LEFT ARM  Result Value Ref Range Status   Specimen Description   Final    BLOOD LEFT ARM BOTTLES DRAWN AEROBIC AND ANAEROBIC Performed at [facility], [address] [place], [place] [address]    Special Requests   Final    Blood Culture adequate volume Performed at [facility], [address] [place], [place] [address]    Culture  Setup Time   Final    GRAM POSITIVE COCCI ANAEROBIC BOTTLE ONLY Gram Stain Report Called to,Read Back By and Verified With: C. ANTONE ON 02/18/2024 @10 :13AM BY T. HAMER  CRITICAL RESULT CALLED TO, READ BACK BY AND VERIFIED WITH: RN DOROTHYANN ANTONE [id number] AT 1351 BY EC Performed at [facility], [address] [place], [place] [address]    Culture METHICILLIN RESISTANT STAPHYLOCOCCUS AUREUS (A)  Final   Report Status 02/20/2024 FINAL  Final   Organism ID, Bacteria METHICILLIN RESISTANT STAPHYLOCOCCUS AUREUS  Final      Susceptibility   Methicillin resistant staphylococcus aureus - MIC*    CIPROFLOXACIN  >=8 RESISTANT Resistant     ERYTHROMYCIN <=0.25 SENSITIVE Sensitive     GENTAMICIN <=0.5 SENSITIVE Sensitive     OXACILLIN >=4 RESISTANT Resistant     TETRACYCLINE <=1 SENSITIVE Sensitive     VANCOMYCIN  1 SENSITIVE Sensitive      TRIMETH /SULFA  <=10 SENSITIVE Sensitive     CLINDAMYCIN <=0.25 SENSITIVE Sensitive     RIFAMPIN <=0.5 SENSITIVE Sensitive     Inducible Clindamycin NEGATIVE Sensitive     LINEZOLID  2 SENSITIVE Sensitive     * METHICILLIN RESISTANT STAPHYLOCOCCUS AUREUS  Blood Culture ID Panel (Reflexed)     Status: Abnormal   Collection Time: 02/17/24  2:48 PM  Result Value Ref Range Status   Enterococcus faecalis NOT DETECTED NOT DETECTED Final   Enterococcus Faecium NOT DETECTED NOT DETECTED Final   Listeria monocytogenes NOT DETECTED NOT DETECTED Final   Staphylococcus species DETECTED (A) NOT DETECTED Final    Comment: CRITICAL RESULT CALLED TO, READ BACK BY AND VERIFIED WITH: RN DOROTHYANN ANTONE [id number] AT 1351 BY EC    Staphylococcus aureus (BCID) DETECTED (A) NOT DETECTED Final    Comment: Methicillin (oxacillin)-resistant Staphylococcus aureus (MRSA). MRSA is predictably resistant to beta-lactam antibiotics (except ceftaroline). Preferred therapy is vancomycin  unless clinically contraindicated. Patient requires contact precautions if  hospitalized. CRITICAL RESULT CALLED TO, READ BACK BY AND VERIFIED WITH: RN DOROTHYANN ANTONE [id number] AT 1351 BY EC    Staphylococcus epidermidis NOT DETECTED NOT DETECTED Final   Staphylococcus lugdunensis NOT DETECTED NOT DETECTED Final   Streptococcus species NOT DETECTED NOT DETECTED Final   Streptococcus agalactiae NOT DETECTED NOT DETECTED Final   Streptococcus pneumoniae NOT DETECTED NOT DETECTED  Final   Streptococcus pyogenes NOT DETECTED NOT DETECTED Final   A.calcoaceticus-baumannii NOT DETECTED NOT DETECTED Final   Bacteroides fragilis NOT DETECTED NOT DETECTED Final   Enterobacterales NOT DETECTED NOT DETECTED Final   Enterobacter cloacae complex NOT DETECTED NOT DETECTED Final   Escherichia coli NOT DETECTED NOT DETECTED Final   Klebsiella aerogenes NOT DETECTED NOT DETECTED Final   Klebsiella oxytoca NOT DETECTED NOT DETECTED Final    Klebsiella pneumoniae NOT DETECTED NOT DETECTED Final   Proteus species NOT DETECTED NOT DETECTED Final   Salmonella species NOT DETECTED NOT DETECTED Final   Serratia marcescens NOT DETECTED NOT DETECTED Final   Haemophilus influenzae NOT DETECTED NOT DETECTED Final   Neisseria meningitidis NOT DETECTED NOT DETECTED Final   Pseudomonas aeruginosa NOT DETECTED NOT DETECTED Final   Stenotrophomonas maltophilia NOT DETECTED NOT DETECTED Final   Candida albicans NOT DETECTED NOT DETECTED Final   Candida auris NOT DETECTED NOT DETECTED Final   Candida glabrata NOT DETECTED NOT DETECTED Final   Candida krusei NOT DETECTED NOT DETECTED Final   Candida parapsilosis NOT DETECTED NOT DETECTED Final   Candida tropicalis NOT DETECTED NOT DETECTED Final   Cryptococcus neoformans/gattii NOT DETECTED NOT DETECTED Final   Meth resistant mecA/C and MREJ DETECTED (A) NOT DETECTED Final    Comment: CRITICAL RESULT CALLED TO, READ BACK BY AND VERIFIED WITH: RN DOROTHYANN ISLAND [id number] AT 1351 BY EC Performed at [facility], [address], [place], [place] [address]   MIC (1 Drug)-     Status: Abnormal   Collection Time: 02/17/24  2:48 PM  Result Value Ref Range Status   Min Inhibitory Conc (1 Drug) Final report (A)  Corrected    Comment: (NOTE) Performed At: [facility] [address] [place], [place] [id number] Jennette Shorter MD Ey:[id number] CORRECTED ON 01/10 AT 1335: PREVIOUSLY REPORTED AS Preliminary report    Source DAPTOMYCIN  MIC MRSA BLOOD CULTURE  Final    Comment: Performed at [facility], [address], [place], [place] [address]  MIC Result     Status: Abnormal   Collection Time: 02/17/24  2:48 PM  Result Value Ref Range Status   Result 1 (MIC) Comment (A)  Final    Comment: (NOTE) Methicillin - resistant Staphylococcus aureus Identification performed by account, not confirmed by this laboratory. Testing performed by broth microdilution. DAPTOMYCIN   <=0.25  ug/ML  SUSCEPTIBLE Performed At: [facility] [address] [place], [place] [id number] Jennette Shorter MD Ey:[id number]   Blood Culture (routine x 2)     Status: None   Collection Time: 02/17/24  2:50 PM   Specimen: BLOOD LEFT WRIST  Result Value Ref Range Status   Specimen Description   Final    BLOOD LEFT WRIST BOTTLES DRAWN AEROBIC AND ANAEROBIC   Special Requests Blood Culture adequate volume  Final   Culture   Final    NO GROWTH 5 DAYS Performed at [facility], [address] [place], [place] [address]    Report Status 02/22/2024 FINAL  Final  C Difficile Quick Screen w PCR reflex     Status: Abnormal   Collection Time: 02/18/24  6:35 PM   Specimen: Stool  Result Value Ref Range Status   C Diff antigen POSITIVE (A) NEGATIVE Final   C Diff toxin NEGATIVE NEGATIVE Final   C Diff interpretation Results are indeterminate. See PCR results.  Final    Comment: Performed at [facility], [address], [place], [place] [address]  C. Diff by PCR, Reflexed     Status: None   Collection Time: 02/18/24  6:35 PM  Result Value Ref Range Status   Toxigenic C. Difficile by PCR NEGATIVE NEGATIVE Final    Comment: Patient is colonized with non toxigenic C. difficile. May not need treatment unless significant symptoms are present.   Hypervirulent Strain PRESUMPTIVE NEGATIVE PRESUMPTIVE NEGATIVE Final    Comment: Performed at [facility], [address] [place], [place] [address]  Gastrointestinal Panel by PCR , Stool     Status: None   Collection Time: 02/18/24  8:02 PM  Result Value Ref Range Status   Campylobacter species NOT DETECTED NOT DETECTED Final   Plesimonas shigelloides NOT DETECTED NOT DETECTED Final   Salmonella species NOT DETECTED NOT DETECTED Final   Yersinia enterocolitica NOT DETECTED NOT DETECTED Final   Vibrio species NOT DETECTED NOT DETECTED Final   Vibrio cholerae NOT DETECTED NOT DETECTED Final   Enteroaggregative E coli (EAEC) NOT DETECTED NOT  DETECTED Final   Enteropathogenic E coli (EPEC) NOT DETECTED NOT DETECTED Final   Enterotoxigenic E coli (ETEC) NOT DETECTED NOT DETECTED Final   Shiga like toxin producing E coli (STEC) NOT DETECTED NOT DETECTED Final   Shigella/Enteroinvasive E coli (EIEC) NOT DETECTED NOT DETECTED Final   Cryptosporidium NOT DETECTED NOT DETECTED Final   Cyclospora cayetanensis NOT DETECTED NOT DETECTED Final   Entamoeba histolytica NOT DETECTED NOT DETECTED Final   Giardia lamblia NOT DETECTED NOT DETECTED Final   Adenovirus F40/41 NOT DETECTED NOT DETECTED Final   Astrovirus NOT DETECTED NOT DETECTED Final   Norovirus GI/GII NOT DETECTED NOT DETECTED Final   Rotavirus A NOT DETECTED NOT DETECTED Final   Sapovirus (I, II, IV, and V) NOT DETECTED NOT DETECTED Final    Comment: Performed at [facility], [address] [place], [place] [address]  Respiratory (~20 pathogens) panel by PCR     Status: None   Collection Time: 02/19/24 12:58 AM   Specimen: Nasopharyngeal Swab; Respiratory  Result Value Ref Range Status   Adenovirus NOT DETECTED NOT DETECTED Final   Coronavirus 229E NOT DETECTED NOT DETECTED Final    Comment: (NOTE) The Coronavirus on the Respiratory Panel, DOES NOT test for the novel  Coronavirus (2019 nCoV)    Coronavirus HKU1 NOT DETECTED NOT DETECTED Final   Coronavirus NL63 NOT DETECTED NOT DETECTED Final   Coronavirus OC43 NOT DETECTED NOT DETECTED Final   Metapneumovirus NOT DETECTED NOT DETECTED Final   Rhinovirus / Enterovirus NOT DETECTED NOT DETECTED Final   Influenza A NOT DETECTED NOT DETECTED Final   Influenza B NOT DETECTED NOT DETECTED Final   Parainfluenza Virus 1 NOT DETECTED NOT DETECTED Final   Parainfluenza Virus 2 NOT DETECTED NOT DETECTED Final   Parainfluenza Virus 3 NOT DETECTED NOT DETECTED Final   Parainfluenza Virus 4 NOT DETECTED NOT DETECTED Final   Respiratory Syncytial Virus NOT DETECTED NOT DETECTED Final   Bordetella pertussis NOT DETECTED  NOT DETECTED Final   Bordetella Parapertussis NOT DETECTED NOT DETECTED Final   Chlamydophila pneumoniae NOT DETECTED NOT DETECTED Final   Mycoplasma pneumoniae NOT DETECTED NOT DETECTED Final    Comment: Performed at [facility], [address] [place], [place] [address]  Culture, blood (Routine X 2) w Reflex to ID Panel     Status: None   Collection Time: 02/19/24  2:39 AM   Specimen: BLOOD RIGHT HAND  Result Value Ref Range Status   Specimen Description  BLOOD RIGHT HAND  Final   Special Requests   Final    BOTTLES DRAWN AEROBIC AND ANAEROBIC Blood Culture adequate volume IMMUNOCOMPROMISED   Culture   Final    NO GROWTH 5 DAYS Performed at [facility], [address] [place], [place] [address]    Report Status 02/24/2024 FINAL  Final  Culture, blood (Routine X 2) w Reflex to ID Panel     Status: None   Collection Time: 02/19/24  2:39 AM   Specimen: BLOOD RIGHT ARM  Result Value Ref Range Status   Specimen Description BLOOD RIGHT ARM  Final   Special Requests   Final    Immunocompromised BOTTLES DRAWN AEROBIC AND ANAEROBIC Blood Culture adequate volume   Culture   Final    NO GROWTH 5 DAYS Performed at [facility], [address] [place], [place] [address]    Report Status 02/24/2024 FINAL  Final  Urine Culture     Status: None   Collection Time: 02/19/24  6:32 AM   Specimen: Urine, Random  Result Value Ref Range Status   Specimen Description URINE, RANDOM  Final   Special Requests URINE, CLEAN CATCH  Final   Culture   Final    NO GROWTH Performed at [facility], [address], [place], [place] [address]    Report Status 02/20/2024 FINAL  Final  Surgical pcr screen     Status: Abnormal   Collection Time: 02/20/24  2:18 PM   Specimen: Nasal Mucosa; Nasal Swab  Result Value Ref Range Status   MRSA, PCR POSITIVE (A) NEGATIVE Final    Comment: RESULT CALLED TO, READ BACK BY AND VERIFIED WITH: RN CANDIE SHUCK [phone] @ [phone]    Staphylococcus  aureus POSITIVE (A) NEGATIVE Final    Comment: (NOTE) The Xpert SA Assay (FDA approved for NASAL specimens in patients 55 years of age and older), is one component of a comprehensive surveillance program. It is not intended to diagnose infection nor to guide or monitor treatment. Performed at [facility], [address] [place], [place] [address]     Labs: CBC: Recent Labs  Lab 02/23/24 0535 02/24/24 0456 02/25/24 0541 02/26/24 0526 02/27/24 0513  WBC 2.9* 3.5* 5.6 3.7* 7.9  NEUTROABS  --  2.0 4.1 3.4 7.1  HGB 7.3* 8.9* 8.4* 8.6* 9.1*  HCT 22.1* 26.7* 25.2* 25.6* 27.0*  MCV 82.2 81.7 81.6 81.0 79.9*  PLT 202 237 245 244 295   Basic Metabolic Panel: Recent Labs  Lab 02/23/24 0535 02/24/24 0456 02/25/24 0541 02/26/24 0526 02/27/24 0513  NA 137 133* 135 131* 131*  K 3.8 3.9 4.4 5.0 5.3*  CL 108 102 104 98 99  CO2 21* 22 22 22  21*  GLUCOSE 179* 131* 109* 320* 286*  BUN 39* 44* 56* 66* 73*  CREATININE 3.46* 3.29* 3.56* 3.44* 3.27*  CALCIUM  7.1* 7.4* 7.4* 8.0* 7.8*  MG 1.5* 1.9 1.9 2.0 1.9  PHOS  --  4.0 3.6 4.2 4.3   Liver Function Tests: Recent Labs  Lab 02/24/24 0456 02/25/24 0541 02/26/24 0526 02/27/24 0513  AST 23 21 19 22   ALT 6 8 7 13   ALKPHOS 43 43 51 48  BILITOT <0.2 0.2 0.2 0.3  PROT 5.3* 5.2* 6.0* 5.7*  ALBUMIN 2.3* 2.3* 2.6* 2.6*   CBG: Recent Labs  Lab 02/27/24 0803 02/27/24 1144 02/27/24 1532 02/27/24 2110 02/28/24 0714  GLUCAP 277* 356* 264* 345* 245*    Discharge  time spent: 40 minutes  Signed: Deliliah Room, MD [facility] 02/28/2024 "

## 2024-02-28 NOTE — Progress Notes (Deleted)
" °   02/28/24 1356  Legal Guardian  Does Patient Have a Court Appointed Legal Guardian? Yes  Legal Guardian Other:  Legal Guardian Contact Information Ronal Brought [phone]  Copy of Legal Guardianship Form in Chart No - copy requested  Legal Guardian Notified of Arrival  Successfully notified  Legal Guardian Notified of Pending Discharge  Attempted notification unsuccessful   Unable to reach emergency contact to notify for discharge. LCSW made aware.  "

## 2024-02-28 NOTE — Progress Notes (Signed)
 RN called and provided report to [place] from [facility]. Prevana has been attached to the patient, 2 L PIV removed for discharge. Patient belongings in bags for discharge. PTAR has been arranged.

## 2024-02-29 ENCOUNTER — Ambulatory Visit: Admitting: Orthopedic Surgery

## 2024-03-05 ENCOUNTER — Encounter: Payer: Self-pay | Admitting: Family

## 2024-03-05 ENCOUNTER — Ambulatory Visit: Admitting: Family

## 2024-03-05 DIAGNOSIS — M86271 Subacute osteomyelitis, right ankle and foot: Secondary | ICD-10-CM

## 2024-03-05 NOTE — Progress Notes (Signed)
 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  He is residing at a skilled nursing limb protector in place. wound VAC removed today Ortho Exam On examination right residual limb this is well consolidated the incision is well-approximated staples there is no gaping or drainage  Visit Diagnoses: No diagnosis found.  Plan: Begin daily Dial soap cleansing.  Dry dressings.  Shrinker around-the-clock continue the limb protector he will follow-up in 2 weeks for staple removal  Follow-Up Instructions: No follow-ups on file.   Imaging: No results found.  Orders:  No orders of the defined types were placed in this encounter.  No orders of the defined types were placed in this encounter.    PMFS History: Patient Active Problem List   Diagnosis Date Noted   MRSA bacteremia 02/18/2024   Septic arthritis of right foot 02/18/2024   Gastroenteritis 02/17/2024   Diabetic foot infection (HCC) 02/17/2024   Malnutrition of moderate degree 01/24/2024   Depression with anxiety 01/16/2024   Acute ischemic stroke (HCC) 05/03/2023   H/O Hemorrhagic stroke 05/03/2023   Chronic osteomyelitis (HCC) 05/03/2023   Hyperglycemia 03/04/2023   Osteomyelitis of right foot (HCC) 03/01/2023   Lactic acidosis 03/01/2023   History of transmetatarsal amputation of right foot (HCC) 04/06/2022   Cutaneous abscess of right foot 02/04/2022   Osteomyelitis of great toe of left foot (HCC)    Hyperlipidemia 11/30/2021   Bedbug bite with infection 11/30/2021    Class: Acute   Cellulitis of right foot 11/12/2021   AKI (acute kidney injury) 11/12/2021   Sepsis  (HCC) 11/11/2021   Amputated great toe of right foot 02/20/2019   Stage 3a chronic kidney disease (HCC) 02/13/2019   Acute osteomyelitis (HCC)    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/30/2019   Fatty liver 01/09/2019   Thrombocytopenia, unspecified 05/14/2013   Other specified abnormal findings of blood chemistry 05/14/2013   Colon adenoma 09/13/2012   Anxiety 09/06/2012   Intellectual disability 09/06/2012   Chest pain 09/06/2012   GERD (gastroesophageal reflux disease) 03/08/2012   Gout 09/06/2011   Hypertension 03/08/2011   Chronic kidney disease, stage III (moderate) (HCC) 11/30/2009   Urinary tract infection 11/30/2009   Past Medical History:  Diagnosis Date   Anemia    Anxiety    Arthritis    Colon polyps    Depression    Diabetes mellitus without complication (HCC)    DM (diabetes mellitus) (HCC) 01/30/2019   Fatty liver    GERD (gastroesophageal reflux disease)    Gout    High cholesterol    History of echocardiogram 09/14/2011   EF >55%; borderline concentric LVH;    History of stress test 06/21/2010   exercise; normal study   Hypertension    MET TEST 08/15/2011   moderate peak VO2 limitation at 66% predicted; mod cardiac impairment with low SV & low anaerobic threshold, mild chronotropic incompetence; low risk   Nausea    Osteomyelitis (HCC)    right great toe   Renal  disorder    Renal insufficiency    Speech impediment    Thrombocytopenia    PMH    Family History  Problem Relation Age of Onset   Colon cancer Brother     Past Surgical History:  Procedure Laterality Date   AMPUTATION Right 02/13/2019   Procedure: RIGHT GREAT TOE AMPUTATION;  Surgeon: Harden Jerona GAILS, MD;  Location: [facility] OR;  Service: Orthopedics;  Laterality: Right;   AMPUTATION Right 02/04/2022   Procedure: RIGHT TRANSMETATARSAL AMPUTATION APPLICATION OF WOUND VAC;  Surgeon: Harden Jerona GAILS, MD;  Location: [facility] OR;  Service: Orthopedics;  Laterality: Right;   AMPUTATION Right 01/19/2024    Procedure: AMPUTATION, FOOT, RAY;  Surgeon: Harden Jerona GAILS, MD;  Location: [facility] OR;  Service: Orthopedics;  Laterality: Right;  RIGHT FOOT 1ST RAY AMPUTATION   AMPUTATION Right 02/21/2024   Procedure: AMPUTATION BELOW KNEE;  Surgeon: Harden Jerona GAILS, MD;  Location: [facility] OR;  Service: Orthopedics;  Laterality: Right;   AMPUTATION TOE Left 12/02/2021   Procedure: AMPUTATION TOE;  Surgeon: Mavis Anes, MD;  Location: [facility] ORS;  Service: General;  Laterality: Left;   AMPUTATION TOE Left 12/06/2021   Procedure: CLOSURE OF LEFT GREAT TOE AMPUTATION;  Surgeon: Mavis Anes, MD;  Location: [facility] ORS;  Service: General;  Laterality: Left;   CHOLECYSTECTOMY     COLONOSCOPY N/A 09/21/2012   Procedure: COLONOSCOPY;  Surgeon: Claudis RAYMOND Rivet, MD;  Location: [facility] ENDO SUITE;  Service: Endoscopy;  Laterality: N/A;  730   COLONOSCOPY N/A 01/30/2019   rehman, eight 4-7 mm polyps in rectum in sigmoid and at splenic flexure, transverse colon and in cecum, one 9mm polyp in recto-sigmoid colon, clip was placed, nine polyps total removed   ESOPHAGOGASTRODUODENOSCOPY  11/2005   normal mucosa and GE junction   HERNIA REPAIR     x 2   HIP SURGERY     Rt hip age 69   NASAL POLYP SURGERY      2-3 months ago   POLYPECTOMY  01/30/2019   Procedure: POLYPECTOMY;  Surgeon: Rivet Claudis RAYMOND, MD;  Location: [facility];  Service: Endoscopy;;  proximal transverse colon, cecal   TRANSESOPHAGEAL ECHOCARDIOGRAM (CATH LAB) N/A 02/22/2024   Procedure: TRANSESOPHAGEAL ECHOCARDIOGRAM;  Surgeon: Kate Lonni CROME, MD;  Location: [facility] INVASIVE CV LAB;  Service: Cardiovascular;  Laterality: N/A;   Social History   Occupational History   Not on file  Tobacco Use   Smoking status: Never   Smokeless tobacco: Never  Vaping Use   Vaping status: Never Used  Substance and Sexual Activity   Alcohol use: No   Drug use: No   Sexual activity: Not on file    "

## 2024-03-07 ENCOUNTER — Ambulatory Visit: Admitting: Family

## 2024-03-19 ENCOUNTER — Ambulatory Visit: Admitting: Family

## 2024-03-27 ENCOUNTER — Ambulatory Visit: Admitting: Family
